# Patient Record
Sex: Male | Born: 1990
Health system: Southern US, Community
[De-identification: ages and names within clinical notes are randomized; demographics above are authoritative.]

## PROBLEM LIST (undated history)

## (undated) DIAGNOSIS — R569 Unspecified convulsions: Secondary | ICD-10-CM

## (undated) DIAGNOSIS — G35D Multiple sclerosis, unspecified: Secondary | ICD-10-CM

## (undated) DIAGNOSIS — G35 Multiple sclerosis: Secondary | ICD-10-CM

## (undated) HISTORY — PX: NO PAST SURGERIES: SHX2092

---
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
## 2013-05-01 DIAGNOSIS — F1210 Cannabis abuse, uncomplicated: Secondary | ICD-10-CM | POA: Insufficient documentation

## 2013-05-01 HISTORY — DX: Cannabis abuse, uncomplicated: F12.10

## 2016-05-30 DIAGNOSIS — H5202 Hypermetropia, left eye: Secondary | ICD-10-CM | POA: Insufficient documentation

## 2018-07-12 ENCOUNTER — Emergency Department (HOSPITAL_COMMUNITY): Payer: Medicare HMO

## 2018-07-12 ENCOUNTER — Observation Stay (HOSPITAL_COMMUNITY)
Admission: EM | Admit: 2018-07-12 | Discharge: 2018-07-13 | Disposition: A | Discharge status: Home / Self Care | Payer: Medicare HMO | Attending: Internal Medicine | Admitting: Internal Medicine

## 2018-07-12 ENCOUNTER — Encounter (HOSPITAL_COMMUNITY): Payer: Self-pay | Admitting: Internal Medicine

## 2018-07-12 DIAGNOSIS — G35A Relapsing-remitting multiple sclerosis: Secondary | ICD-10-CM

## 2018-07-12 DIAGNOSIS — J690 Pneumonitis due to inhalation of food and vomit: Secondary | ICD-10-CM

## 2018-07-12 DIAGNOSIS — Z20828 Contact with and (suspected) exposure to other viral communicable diseases: Secondary | ICD-10-CM | POA: Diagnosis not present

## 2018-07-12 DIAGNOSIS — G40901 Epilepsy, unspecified, not intractable, with status epilepticus: Secondary | ICD-10-CM

## 2018-07-12 DIAGNOSIS — Z114 Encounter for screening for human immunodeficiency virus [HIV]: Secondary | ICD-10-CM

## 2018-07-12 DIAGNOSIS — R000 Tachycardia, unspecified: Secondary | ICD-10-CM | POA: Diagnosis not present

## 2018-07-12 DIAGNOSIS — R0689 Other abnormalities of breathing: Secondary | ICD-10-CM | POA: Diagnosis not present

## 2018-07-12 DIAGNOSIS — G35 Multiple sclerosis: Secondary | ICD-10-CM | POA: Insufficient documentation

## 2018-07-12 DIAGNOSIS — R404 Transient alteration of awareness: Secondary | ICD-10-CM | POA: Diagnosis not present

## 2018-07-12 DIAGNOSIS — R569 Unspecified convulsions: Secondary | ICD-10-CM | POA: Diagnosis not present

## 2018-07-12 DIAGNOSIS — R0902 Hypoxemia: Secondary | ICD-10-CM | POA: Diagnosis not present

## 2018-07-12 DIAGNOSIS — R918 Other nonspecific abnormal finding of lung field: Secondary | ICD-10-CM | POA: Diagnosis not present

## 2018-07-12 HISTORY — DX: Unspecified convulsions: R56.9

## 2018-07-12 HISTORY — DX: Pneumonitis due to inhalation of food and vomit: J69.0

## 2018-07-12 HISTORY — DX: Multiple sclerosis: G35

## 2018-07-12 HISTORY — DX: Epilepsy, unspecified, not intractable, with status epilepticus: G40.901

## 2018-07-12 HISTORY — DX: Multiple sclerosis, unspecified: G35.D

## 2018-07-12 HISTORY — DX: Encounter for screening for human immunodeficiency virus (HIV): Z11.4

## 2018-07-12 LAB — BASIC METABOLIC PANEL
Anion gap: 14 (ref 5–15)
BUN: 9 mg/dL (ref 6–20)
CO2: 19 mmol/L — ABNORMAL LOW (ref 22–32)
Calcium: 9 mg/dL (ref 8.9–10.3)
Chloride: 105 mmol/L (ref 98–111)
Creatinine, Ser: 1.1 mg/dL (ref 0.61–1.24)
GFR calc Af Amer: >60 mL/min (ref >60)
GFR calc non Af Amer: >60 mL/min (ref >60)
Glucose, Bld: 114 mg/dL — ABNORMAL HIGH (ref 70–99)
Potassium: 4.3 mmol/L (ref 3.5–5.1)
Sodium: 138 mmol/L (ref 135–145)

## 2018-07-12 LAB — CBC WITH DIFFERENTIAL/PLATELET
Abs Immature Granulocytes: 0.06 10*3/uL (ref 0.00–0.07)
Basophils Absolute: 0 10*3/uL (ref 0.0–0.1)
Basophils Relative: 0 %
Eosinophils Absolute: 0 10*3/uL (ref 0.0–0.5)
Eosinophils Relative: 0 %
HCT: 45.5 % (ref 39.0–52.0)
Hemoglobin: 15.3 g/dL (ref 13.0–17.0)
Immature Granulocytes: 0 %
Lymphocytes Relative: 7 %
Lymphs Abs: 1.1 10*3/uL (ref 0.7–4.0)
MCH: 32.3 pg (ref 26.0–34.0)
MCHC: 33.6 g/dL (ref 30.0–36.0)
MCV: 96 fL (ref 80.0–100.0)
Monocytes Absolute: 1 10*3/uL (ref 0.1–1.0)
Monocytes Relative: 6 %
Neutro Abs: 13.2 10*3/uL — ABNORMAL HIGH (ref 1.7–7.7)
Neutrophils Relative %: 87 %
Platelets: 271 10*3/uL (ref 150–400)
RBC: 4.74 MIL/uL (ref 4.22–5.81)
RDW: 13.6 % (ref 11.5–15.5)
WBC: 15.4 10*3/uL — ABNORMAL HIGH (ref 4.0–10.5)
nRBC: 0 % (ref 0.0–0.2)

## 2018-07-12 LAB — ACETAMINOPHEN LEVEL: Acetaminophen (Tylenol), Serum: <10 ug/mL — ABNORMAL LOW (ref 10–30)

## 2018-07-12 LAB — CBG MONITORING, ED: Glucose-Capillary: 114 mg/dL — ABNORMAL HIGH (ref 70–99)

## 2018-07-12 LAB — RAPID URINE DRUG SCREEN, HOSP PERFORMED
Amphetamines: NOT DETECTED
Barbiturates: NOT DETECTED
Benzodiazepines: POSITIVE — AB
Cocaine: NOT DETECTED
Opiates: NOT DETECTED
Tetrahydrocannabinol: POSITIVE — AB

## 2018-07-12 LAB — ETHANOL: Alcohol, Ethyl (B): <10 mg/dL (ref <10)

## 2018-07-12 LAB — SALICYLATE LEVEL: Salicylate Lvl: <7 mg/dL (ref 2.8–30.0)

## 2018-07-12 MED ORDER — SODIUM CHLORIDE 0.9 % IV SOLN
INTRAVENOUS | Status: DC
  Administered 2018-07-12 – 2018-07-13 (×2): via INTRAVENOUS

## 2018-07-12 MED ORDER — LORAZEPAM 2 MG/ML IJ SOLN
1.0000 mg | Freq: Once | INTRAMUSCULAR | Status: AC
  Administered 2018-07-12: 1 mg via INTRAVENOUS
  Filled 2018-07-12: qty 1

## 2018-07-12 MED ORDER — SODIUM CHLORIDE 0.9 % IV SOLN
3.0000 g | Freq: Four times a day (QID) | INTRAVENOUS | Status: DC
  Administered 2018-07-13 (×3): 3 g via INTRAVENOUS
  Filled 2018-07-12 (×6): qty 3

## 2018-07-12 MED ORDER — LEVETIRACETAM IN NACL 500 MG/100ML IV SOLN
500.0000 mg | Freq: Two times a day (BID) | INTRAVENOUS | Status: DC
  Administered 2018-07-13: 500 mg via INTRAVENOUS
  Filled 2018-07-12: qty 100

## 2018-07-12 MED ORDER — ACETAMINOPHEN 650 MG RE SUPP: 650.0000 mg | RECTAL | Status: DC | PRN

## 2018-07-12 MED ORDER — SODIUM CHLORIDE 0.9 % IV BOLUS
1000.0000 mL | Freq: Once | INTRAVENOUS | Status: AC
  Administered 2018-07-12: 1000 mL via INTRAVENOUS

## 2018-07-12 MED ORDER — PIPERACILLIN-TAZOBACTAM 3.375 G IVPB 30 MIN
3.3750 g | Freq: Once | INTRAVENOUS | Status: AC
  Administered 2018-07-12: 3.375 g via INTRAVENOUS
  Filled 2018-07-12: qty 50

## 2018-07-12 MED ORDER — SODIUM CHLORIDE 0.9 % IV SOLN: 75.0000 mL/h | INTRAVENOUS | Status: DC

## 2018-07-12 MED ORDER — LEVETIRACETAM IN NACL 1000 MG/100ML IV SOLN
1000.0000 mg | Freq: Once | INTRAVENOUS | Status: AC
  Administered 2018-07-12: 1000 mg via INTRAVENOUS
  Filled 2018-07-12: qty 100

## 2018-07-12 MED ORDER — ENOXAPARIN SODIUM 40 MG/0.4ML ~~LOC~~ SOLN
40.0000 mg | Freq: Every day | SUBCUTANEOUS | Status: DC
  Administered 2018-07-13: 40 mg via SUBCUTANEOUS
  Filled 2018-07-12: qty 0.4

## 2018-07-12 MED ORDER — ACETAMINOPHEN 325 MG PO TABS: 650.0000 mg | ORAL_TABLET | ORAL | Status: DC | PRN

## 2018-07-12 MED ORDER — LORAZEPAM 2 MG/ML IJ SOLN: 1.0000 mg | INTRAMUSCULAR | Status: DC | PRN

## 2018-07-12 NOTE — Progress Notes (Signed)
Pharmacy Antibiotic Note  Miguel Black is a 28 y.o. male admitted on 07/12/2018 with pneumonia.  Pharmacy has been consulted for unasyn dosing. Pt is afebrile but WBC Is elevated at 15.4. SCr is WNL at 1.1  Plan: Unasyn 3gm IV Q6H F/u renal fxn, C&S, clinical status *Pharmacy will sign off as no further dose adjustments are anticipated.   Height: 5\' 8"  (172.7 cm) Weight: 144 lb 10 oz (65.6 kg) IBW/kg (Calculated) : 68.4  Temp (24hrs), Avg:98.1 F (36.7 C), Min:98.1 F (36.7 C), Max:98.1 F (36.7 C)  Recent Labs  Lab 07/12/18 2024  WBC 15.4*  CREATININE 1.10    Estimated Creatinine Clearance: 93.6 mL/min (by C-G formula based on SCr of 1.1 mg/dL).    Not on File  Antimicrobials this admission: Unasyn 6/19>> Zosyn x 1 6/18  Dose adjustments this admission: N/A  Microbiology results: Pending  Thank you for allowing pharmacy to be a part of this patient's care.  Loretta Doutt, Rande Lawman 07/12/2018 10:38 PM

## 2018-07-12 NOTE — H&P (Signed)
History and Physical    Miguel Black ZOX:[id number] DOB: 07/22/1990 DOA: 07/12/2018  PCP: No primary care provider on file. Patient coming from: Home  Chief Complaint: Seizures  HPI: Miguel Black is a 28 y.o. male with a past medical history of seizure, MS presenting to the hospital for evaluation of seizures. Per EMS, patient had 2-3 seizures at home and additional 2 seizures in route and was given 10 mg Versed.  Family reported history of a seizure 2 years ago in [place].  He was evaluated in the hospital at that time and discharged home on Keppra.  He was on Keppra briefly.  He has MS and is followed at [facility] by Dr. Macarthur CritchleyZeid.  He was switched to Ocrevus IV a few months ago.  Patient is currently very somnolent and postictal.  No history could be obtained from him.  ED Course: Afebrile.  Tachycardic and tachypneic on arrival, now resolved.  White count 15.4.  Blood glucose 114.  Salicylate and acetaminophen levels negative.  Blood ethanol level less than 10.  UDS positive for benzodiazepines and THC.  Lactic acid pending.  COVID-19 rapid test pending.  Blood culture x2 pending.  Chest x-ray showing patchy opacities in the right greater than left mid lower lung zones suspicious for aspiration in the setting of seizure, pneumonia also considered.  Head CT negative for acute finding.  Patient received Keppra 1000 mg loading dose and an additional 1000 mg ordered by neurology.  Also received Zosyn.  Review of Systems:  All systems reviewed and apart from history of presenting illness, are negative.  Past Medical History:  Diagnosis Date  . MS (multiple sclerosis) (HCC)   . Seizure [facility](HCC)    Surgical history: Patient is postictal. Not able to obtain at this time.  Allergies: Patient is postictal. Not able to obtain at this time.  Social history: Patient is postictal. Not able to obtain at this time.  Family history: Patient is postictal. Not able to obtain at this time.  Prior to  Admission medications   Not on File    Physical Exam: Vitals:   07/12/18 2030 07/12/18 2034 07/12/18 2115 07/12/18 2205  BP:  (!) 137/95 (!) 161/92 106/78  Pulse: 93  100 83  Resp: (!) 21 (!) 30 (!) 25 18  Temp:    98.1 F (36.7 C)  TempSrc:    Oral  SpO2: 98% 100% 99% 96%  Weight:    65.6 kg  Height:    5\' 8"  (1.727 m)    Physical Exam  Constitutional: He appears well-developed and well-nourished. No distress.  HENT:  Head: Normocephalic.  Eyes: Pupils are equal, round, and reactive to light. Right eye exhibits no discharge. Left eye exhibits no discharge.  Neck: Neck supple.  Cardiovascular: Regular rhythm and intact distal pulses.  Slightly tachycardic  Pulmonary/Chest: Effort normal. No respiratory distress. He has no wheezes. He has no rales.  Anterior lung fields clear to auscultation  Abdominal: Soft. Bowel sounds are normal. He exhibits no distension. There is no abdominal tenderness. There is no guarding.  Musculoskeletal:        General: No edema.  Neurological:  Postictal, very somnolent Not opening eyes or following commands Grimaces to painful stimuli  Skin: Skin is warm and dry. He is not diaphoretic.     Labs on Admission: I have personally reviewed following labs and imaging studies  CBC: Recent Labs  Lab 07/12/18 2024  WBC 15.4*  NEUTROABS 13.2*  HGB 15.3  HCT 45.5  MCV 96.0  PLT 271   Basic Metabolic Panel: Recent Labs  Lab 07/12/18 2024  NA 138  K 4.3  CL 105  CO2 19*  GLUCOSE 114*  BUN 9  CREATININE 1.10  CALCIUM 9.0   GFR: Estimated Creatinine Clearance: 93.6 mL/min (by C-G formula based on SCr of 1.1 mg/dL). Liver Function Tests: No results for input(s): AST, ALT, ALKPHOS, BILITOT, PROT, ALBUMIN in the last 168 hours. No results for input(s): LIPASE, AMYLASE in the last 168 hours. No results for input(s): AMMONIA in the last 168 hours. Coagulation Profile: No results for input(s): INR, PROTIME in the last 168 hours.  Cardiac Enzymes: No results for input(s): CKTOTAL, CKMB, CKMBINDEX, TROPONINI in the last 168 hours. BNP (last 3 results) No results for input(s): PROBNP in the last 8760 hours. HbA1C: No results for input(s): HGBA1C in the last 72 hours. CBG: Recent Labs  Lab 07/12/18 2013  GLUCAP 114*   Lipid Profile: No results for input(s): CHOL, HDL, LDLCALC, TRIG, CHOLHDL, LDLDIRECT in the last 72 hours. Thyroid Function Tests: No results for input(s): TSH, T4TOTAL, FREET4, T3FREE, THYROIDAB in the last 72 hours. Anemia Panel: No results for input(s): VITAMINB12, FOLATE, FERRITIN, TIBC, IRON, RETICCTPCT in the last 72 hours. Urine analysis: No results found for: COLORURINE, APPEARANCEUR, LABSPEC, PHURINE, GLUCOSEU, HGBUR, BILIRUBINUR, KETONESUR, PROTEINUR, UROBILINOGEN, NITRITE, LEUKOCYTESUR  Radiological Exams on Admission: Ct Head Wo Contrast  Result Date: 07/12/2018 CLINICAL DATA:  Seizure EXAM: CT HEAD WITHOUT CONTRAST TECHNIQUE: Contiguous axial images were obtained from the base of the skull through the vertex without intravenous contrast. COMPARISON:  None. FINDINGS: Brain: No evidence of acute infarction, hemorrhage, hydrocephalus, extra-axial collection or mass lesion/mass effect. Vascular: No hyperdense vessel or unexpected calcification. Skull: Normal. Negative for fracture or focal lesion. Sinuses/Orbits: Mucous retention cyst in the right maxillary sinus. Minimal mucosal thickening in the sphenoid and ethmoid sinus Other: None IMPRESSION: Negative non contrasted CT appearance of the brain. Electronically Signed   By: Jasmine PangKim  Fujinaga M.D.   On: 07/12/2018 21:19   Dg Chest Port 1 View  Result Date: 07/12/2018 CLINICAL DATA:  Seizure. EXAM: PORTABLE CHEST 1 VIEW COMPARISON:  None. FINDINGS: Patchy opacities in the right greater than left mid lower lung zones. Lung volumes are low. Heart is normal in size. Normal mediastinal contours for rotation. No pulmonary edema, pleural effusion or  pneumothorax. No acute osseous abnormalities. IMPRESSION: Patchy opacities in the right greater than left mid lower lung zones, may be suspicious for aspiration in the setting of seizure, pneumonia also considered. Electronically Signed   By: Narda RutherfordMelanie  Sanford M.D.   On: 07/12/2018 20:43    EKG: Independently reviewed.  Sinus rhythm, biatrial enlargement, no prior tracing for comparison.  Assessment/Plan Principal Problem:   Status epilepticus (HCC) Active Problems:   Aspiration pneumonia (HCC)   MS (multiple sclerosis) (HCC)   Encounter for screening for HIV   Status epilepticus Head CT negative for acute finding.  UDS was positive for benzodiazepines and THC.  Patient was seen by neurology and it is thought that seizure could possibly be related to proconvulsant activity of possible additive to cannabis.  May also be lesional or cryptogenic epilepsy.  Blood ethanol level less than 10, question whether seizure activity is related to alcohol withdrawal. -Monitor in the progressive care unit -Received loading dose Keppra in the ED.  Continue IV Keppra 500 mg twice daily. -Ativan PRN -Brain MRI with and without contrast -EEG -Check magnesium level -Lactic acid level pending -  Seizure precautions -Frequent neurochecks -Continuous pulse ox  Aspiration pneumonia Afebrile.  White count 15.4.  Currently not hypoxic or tachypneic.  Chest x-ray showing patchy opacities in the right greater than left mid lower lung zones suspicious for aspiration pneumonia in the setting of seizures. -Received a dose of Zosyn in the ED.  Continue antibiotic coverage with Unasyn. -Continuous pulse ox -Supplemental oxygen if needed -COVID-19 rapid test pending -Blood culture x2 pending -Continue to monitor CBC  History of MS Followed at [facility] by Dr. Arvella Nigh.  He was switched to Elaine IV a few months ago.   -Please ensure outpatient follow-up  HIV screening The patient falls between the ages of 13-64 and  should be screened for HIV, therefore HIV testing ordered.  DVT prophylaxis: Lovenox Code Status: Full code Family Communication: No family available at this time. Disposition Plan: Anticipate discharge after clinical improvement.   Consults called: Neurology (Dr. Cheral Marker) Admission status: It is my clinical opinion that referral for OBSERVATION is reasonable and necessary in this patient based on the above information provided. The aforementioned taken together are felt to place the patient at high risk for further clinical deterioration. However it is anticipated that the patient may be medically stable for discharge from the hospital within 24 to 48 hours.  The medical decision making on this patient was of high complexity and the patient is at high risk for clinical deterioration, therefore this is a level 3 visit.  Shela Leff MD [facility] Pager [phone]  If 7PM-7AM, please contact night-coverage [email or web address] Password [facility]  07/12/2018, 10:51 PM

## 2018-07-12 NOTE — Consult Note (Signed)
NEURO HOSPITALIST CONSULT NOTE   Requestig physician: Dr. Haviland  Reason for Consult: Breakthrough seizures  History obParticia Nearingtained from:   Chart    HPI:                                                                                                                                          Miguel Black is an 28 y.o. male with a PMHx of multiple sclerosis and seizures, who presents to the [facility] ED with multiple seizures. Per EMS, he had 2-3 seizures at home and 2 additional seizures en route to the hospital with EMS. He was administered 10 mg Versed by EMS.   Per chart, family reported that the patient had a seizure 2 years ago in [place], for which he was evaluated in the hospital - he had been discharged home on Keppra at that time and continued to take Keppra for a brief period. He follows Dr. Macarthur CritchleyZeid for his multiple sclerosis. He was switched to Ocrevus infusions (ocrelizumab) for his MS a few months ago.   WBC count in the ED was 15.4. UDS was positive for THC. CXR was suggestive of aspiration. He was administered Zosyn.   PMHx: MS Seizure disorder   No family history on file.            Social History:  has no history on file for tobacco, alcohol, and drug.  Not on File  HOME MEDICATIONS:                                                                                                                     None.    ROS:  Unable to obtain due to postictal state.    Blood pressure (!) 161/92, pulse 100, resp. rate (!) 25, SpO2 99 %.   General Examination:                                                                                                       Physical Exam  HEENT-  [place]/AT. In C-collar.   Lungs- Respirations unlabored Extremities- No edema.   Neurological Examination Mental Status:  Obtunded/somnolent/postictal. Will partially open eyes transiently to deep sternal rub. Sonorous respirations. Does not follow commands. No attempts to communicate. Swatted once semi-purposefully at examiner with RUE during sternal rub.  Cranial Nerves:  II: With eyelids held open by examiner, the patient does not move eyes towards or away from light stimulation. No blink to threat.   III,IV, VI: Exotropia noted during the sleep state, with right eye at midline and left eye externally deviated. No nystagmus or twitching noted. Unable to assess for oculocephalic reflex due to C-collar.  V,VII: Mouth open during sleep with no gross asymmetry noted. Does not respond to brow ridge pressure.  VIII: Does not respond to voice IX,X: Unable to assess.  XI: Unable to assess XII: midline tongue noted with mouth open Motor/Sensory: RUE: Swats semipurposefully at examiner with sternal rub. Moves to pinch. Tone mildly increased.  LUE: Does not move purposefully, but will move slightly to sternal rub. Does not move to pinch. Tone mildly increased.  RLE: Withdraws briskly to noxious stimulus, which induces brief ankle clonus.  LLE: Withdraws less briskly than the RLE to noxious stimulus. This also induces brief ankle clonus.  On passive dorsiflexion of feet, 4-5 beats of clonus are present bilaterally  Deep Tendon Reflexes: 3+ bilateral biceps and brachioradialis. 4+ patellae bilaterally with 2 beats clonus. 3+ achilles bilaterally.  Plantars: Right: downgoing   Left: downgoing Cerebellar/Gait: Unable to assess    Lab Results: Basic Metabolic Panel: Recent Labs  Lab 07/12/18 2024  NA 138  K 4.3  CL 105  CO2 19*  GLUCOSE 114*  BUN 9  CREATININE 1.10  CALCIUM 9.0    CBC: Recent Labs  Lab 07/12/18 2024  WBC 15.4*  NEUTROABS 13.2*  HGB 15.3  HCT 45.5  MCV 96.0  PLT 271    Cardiac Enzymes: No results for input(s): CKTOTAL, CKMB, CKMBINDEX, TROPONINI in the last 168 hours.  Lipid  Panel: No results for input(s): CHOL, TRIG, HDL, CHOLHDL, VLDL, LDLCALC in the last 168 hours.  Imaging: Ct Head Wo Contrast  Result Date: 07/12/2018 CLINICAL DATA:  Seizure EXAM: CT HEAD WITHOUT CONTRAST TECHNIQUE: Contiguous axial images were obtained from the base of the skull through the vertex without intravenous contrast. COMPARISON:  None. FINDINGS: Brain: No evidence of acute infarction, hemorrhage, hydrocephalus, extra-axial collection or mass lesion/mass effect. Vascular: No hyperdense vessel or unexpected calcification. Skull: Normal. Negative for fracture or focal lesion. Sinuses/Orbits: Mucous retention cyst in the right maxillary sinus. Minimal mucosal thickening in the sphenoid and ethmoid sinus Other: None IMPRESSION: Negative non contrasted CT appearance of the brain. Electronically Signed   By:  Donavan Foil M.D.   On: 07/12/2018 21:19   Dg Chest Port 1 View  Result Date: 07/12/2018 CLINICAL DATA:  Seizure. EXAM: PORTABLE CHEST 1 VIEW COMPARISON:  None. FINDINGS: Patchy opacities in the right greater than left mid lower lung zones. Lung volumes are low. Heart is normal in size. Normal mediastinal contours for rotation. No pulmonary edema, pleural effusion or pneumothorax. No acute osseous abnormalities. IMPRESSION: Patchy opacities in the right greater than left mid lower lung zones, may be suspicious for aspiration in the setting of seizure, pneumonia also considered. Electronically Signed   By: Keith Rake M.D.   On: 07/12/2018 20:43    Assessment: 28 year old male with new onset seizures x 2 1. Urine toxicology negative for sympathomimetics but positive for THC. Although cannabis traditionally has been used as an anticonvulsant by some cultures for millenia, the proconvulsant versus anticonvulsant acitivity of newer strains is not definitvely known. Use of cannabis also may be associated with additives, some of which may have a proconvulsant effect. Two recent studies (from  2017 and 2020) in mice suggests that high doses of THC may have a proconvulsant rather than an anticonvulsant effect. One of the above studies showed a strong relationship between seizure induction and the synthetic cannabinoids (in slang parlance "spice") JWH-073 and AM-2201. 2. The patient may also have a lesional or cryptogenic epilepsy. Asymmetry seen on his neurological exam, performed while in a postictal state, suggests a focal onset seizure, which would increase the likelihood of an underlying lesion. MRI is indicated to rule out an underlying lesion. CT head was negative, but this imaging modality is of lower sensitivity than MRI.   3. EtOH level was < 10. Consider possible EtOH withdrawal seizures.  4. Of note, side effects of ocrelizumab (depletes CD20 B cells) include possible infections including HSV and PML.   Recommendations: 1. Has been loaded with Keppra 1000 mg. Was still with some twitching, unclear if due to seizure, but additional Keppra 1000 mg IV is being loaded.  2. Will continue Keppra at 500 mg BID (ordered) 3. EEG in AM 4. Magnesium level 5. MRI brain with and without contrast.  6. Inpatient seizure precautions 7. Frequent neuro checks 8. Per [facility] statutes, patients with seizures are not allowed to drive until  they have been seizure-free for six months. Use caution when using heavy equipment or power tools. Avoid working on ladders or at heights. Take showers instead of baths. Ensure the water temperature is not too high on the home water heater. Do not go swimming alone. When caring for infants or small children, sit down when holding, feeding, or changing them to minimize risk of injury to the child in the event you have a seizure. Also, Maintain good sleep hygiene. Avoid alcohol.   Electronically signed: Dr. Kerney Elbe 07/12/2018, 9:48 PM

## 2018-07-12 NOTE — ED Notes (Signed)
Please call mom Demetries Coia @ [phone] for status update--Lige Lakeman

## 2018-07-12 NOTE — ED Provider Notes (Signed)
[facility] EMERGENCY DEPARTMENT Provider Note   CSN: [id number] Arrival date & time: 07/12/18  1947    History   Chief Complaint Chief Complaint  Patient presents with  . Seizures    HPI Miguel Black is a 28 y.o. male.     Pt presents to the ED today with seizures.  Information is obtained via EMS as pt is unable to give any hx.  Per EMS, pt has had several seizures today.  1 was at home witnessed by his father.  Father told EMS it was all over his body.  EMS witnessed 2 grand mal seizures en route.  He was given a total of 10 mg versed IM.  The father told EMS that he's had 1 seizure in his life, but is not on seizure meds.  After getting additional history from his mom, pt had a seizure 2 years ago in [place].  He was evaluated in a hospital and was d/c home with keppra.  He was only on that briefly.  He has MS and is followed at [facility] by Dr. Macarthur Critchley.  He was switched to Ocrevus IV a few months ago.             Past Medical History:  Diagnosis Date  . Multiple sclerosis (HCC)   Past Surgical History:  Procedure Laterality Date  . NO PAST SURGERIES   No Known Allergies Home Medications  Medication Sig Dispense Refill  . cholecalciferol (VITAMIN D3) 1000 UNIT Tab Take 1,000 Units by mouth daily.  Emogene Morgan IV Last reviewed on 12/05/2017 3:21 PM by Neita Garnet, CMAFamily History  Problem Relation Age of Onset  . Hypertension Mother   Soc hx:  + tob, occ mj, occ etoh   Home Medications    Prior to Admission medications   Not on File    Family History No family history on file.  Social History Social History   Tobacco Use  . Smoking status: Not on file  Substance Use Topics  . Alcohol use: Not on file  . Drug use: Not on file     Allergies   Patient has no allergy information on record.  NKDA per mom  Review of Systems Review of Systems  Neurological: Positive for seizures.  All other systems reviewed  and are negative.    Physical Exam Updated Vital Signs BP 106/78 (BP Location: Right Arm)   Pulse 83   Temp 98.1 F (36.7 C) (Oral)   Resp 18   SpO2 96%   Physical Exam Vitals signs and nursing note reviewed.  Constitutional:      General: He is in acute distress.  HENT:     Head: Normocephalic and atraumatic.     Right Ear: External ear normal.     Left Ear: External ear normal.     Nose: Nose normal.     Mouth/Throat:     Mouth: Mucous membranes are dry.  Eyes:     Extraocular Movements: Extraocular movements intact.     Conjunctiva/sclera: Conjunctivae normal.     Pupils: Pupils are equal, round, and reactive to light.  Neck:     Musculoskeletal: Normal range of motion and neck supple.  Cardiovascular:     Rate and Rhythm: Regular rhythm. Tachycardia present.     Pulses: Normal pulses.     Heart sounds: Normal heart sounds.  Pulmonary:     Effort: Pulmonary effort is normal.     Breath sounds: Normal breath sounds.  Abdominal:     General: Abdomen is flat. Bowel sounds are normal.     Palpations: Abdomen is soft.  Skin:    General: Skin is warm.     Capillary Refill: Capillary refill takes less than 2 seconds.  Neurological:     Comments: Responsive to painful stimuli  Tremors      ED Treatments / Results  Labs (all labs ordered are listed, but only abnormal results are displayed) Labs Reviewed  BASIC METABOLIC PANEL - Abnormal; Notable for the following components:      Result Value   CO2 19 (*)    Glucose, Bld 114 (*)    All other components within normal limits  CBC WITH DIFFERENTIAL/PLATELET - Abnormal; Notable for the following components:   WBC 15.4 (*)    Neutro Abs 13.2 (*)    All other components within normal limits  ACETAMINOPHEN LEVEL - Abnormal; Notable for the following components:   Acetaminophen (Tylenol), Serum <10 (*)    All other components within normal limits  RAPID URINE DRUG SCREEN, HOSP PERFORMED - Abnormal; Notable for the  following components:   Benzodiazepines POSITIVE (*)    Tetrahydrocannabinol POSITIVE (*)    All other components within normal limits  CBG MONITORING, ED - Abnormal; Notable for the following components:   Glucose-Capillary 114 (*)    All other components within normal limits  NOVEL CORONAVIRUS, NAA (HOSPITAL ORDER, SEND-OUT TO REF LAB)  CULTURE, BLOOD (ROUTINE X 2)  CULTURE, BLOOD (ROUTINE X 2)  SALICYLATE LEVEL  ETHANOL  LACTIC ACID, PLASMA  LACTIC ACID, PLASMA    EKG EKG Interpretation  Date/Time:  Thursday July 12 2018 19:59:49 EDT Ventricular Rate:  96 PR Interval:    QRS Duration: 77 QT Interval:  346 QTC Calculation: 438 R Axis:   79 Text Interpretation:  Sinus rhythm Biatrial enlargement No old tracing to compare Confirmed by Isla Pence [phone]) on 07/12/2018 8:13:08 PM   Radiology Ct Head Wo Contrast  Result Date: 07/12/2018 CLINICAL DATA:  Seizure EXAM: CT HEAD WITHOUT CONTRAST TECHNIQUE: Contiguous axial images were obtained from the base of the skull through the vertex without intravenous contrast. COMPARISON:  None. FINDINGS: Brain: No evidence of acute infarction, hemorrhage, hydrocephalus, extra-axial collection or mass lesion/mass effect. Vascular: No hyperdense vessel or unexpected calcification. Skull: Normal. Negative for fracture or focal lesion. Sinuses/Orbits: Mucous retention cyst in the right maxillary sinus. Minimal mucosal thickening in the sphenoid and ethmoid sinus Other: None IMPRESSION: Negative non contrasted CT appearance of the brain. Electronically Signed   By: Donavan Foil M.D.   On: 07/12/2018 21:19   Dg Chest Port 1 View  Result Date: 07/12/2018 CLINICAL DATA:  Seizure. EXAM: PORTABLE CHEST 1 VIEW COMPARISON:  None. FINDINGS: Patchy opacities in the right greater than left mid lower lung zones. Lung volumes are low. Heart is normal in size. Normal mediastinal contours for rotation. No pulmonary edema, pleural effusion or pneumothorax. No  acute osseous abnormalities. IMPRESSION: Patchy opacities in the right greater than left mid lower lung zones, may be suspicious for aspiration in the setting of seizure, pneumonia also considered. Electronically Signed   By: Keith Rake M.D.   On: 07/12/2018 20:43    Procedures Procedures (including critical care time)  Medications Ordered in ED Medications  sodium chloride 0.9 % bolus 1,000 mL (1,000 mLs Intravenous New Bag/Given 07/12/18 2000)    And  0.9 %  sodium chloride infusion (has no administration in time range)  piperacillin-tazobactam (ZOSYN) IVPB 3.375  g (3.375 g Intravenous New Bag/Given 07/12/18 2151)  levETIRAcetam (KEPPRA) IVPB 1000 mg/100 mL premix (has no administration in time range)  levETIRAcetam (KEPPRA) IVPB 500 mg/100 mL premix (has no administration in time range)  levETIRAcetam (KEPPRA) IVPB 1000 mg/100 mL premix (0 mg Intravenous Stopped 07/12/18 2030)  LORazepam (ATIVAN) injection 1 mg (1 mg Intravenous Given 07/12/18 2029)     Initial Impression / Assessment and Plan / ED Course  I have reviewed the triage vital signs and the nursing notes.  Pertinent labs & imaging results that were available during my care of the patient were reviewed by me and considered in my medical decision making (see chart for details).  Pt was given an additional 1 g ativan IV and was given 2g keppra IV per Dr. Shelbie HutchingLindzen's recommendation.   The pt was put on 2l oxygen via [place].    The pt was given IV abx for his aspiration pneumonia.  Pt's mother updated on plan.  Pt d/w Dr. Loney Lohathore (triad) for admission.  CRITICAL CARE Performed by: Jacalyn LefevreJulie Thanos Cousineau   Total critical care time: 30 minutes  Critical care time was exclusive of separately billable procedures and treating other patients.  Critical care was necessary to treat or prevent imminent or life-threatening deterioration.  Critical care was time spent personally by me on the following activities: development of  treatment plan with patient and/or surrogate as well as nursing, discussions with consultants, evaluation of patient's response to treatment, examination of patient, obtaining history from patient or surrogate, ordering and performing treatments and interventions, ordering and review of laboratory studies, ordering and review of radiographic studies, pulse oximetry and re-evaluation of patient's condition.  Miguel Black was evaluated in Emergency Department on 07/12/2018 for the symptoms described in the history of present illness. He was evaluated in the context of the global COVID-19 pandemic, which necessitated consideration that the patient might be at risk for infection with the SARS-CoV-2 virus that causes COVID-19. Institutional protocols and algorithms that pertain to the evaluation of patients at risk for COVID-19 are in a state of rapid change based on information released by regulatory bodies including the CDC and federal and state organizations. These policies and algorithms were followed during the patient's care in the ED. Final Clinical Impressions(s) / ED Diagnoses   Final diagnoses:  Status epilepticus (HCC)  Aspiration pneumonia of both lungs, unspecified aspiration pneumonia type, unspecified part of lung [facility](HCC)    ED Discharge Orders    None       Jacalyn LefevreHaviland, Carisa Backhaus, MD 07/12/18 2244

## 2018-07-12 NOTE — ED Notes (Signed)
Patient transported to CT. Nurse with pt.

## 2018-07-12 NOTE — ED Notes (Signed)
ED TO INPATIENT HANDOFF REPORT  ED Nurse Name and Phone #:  Heyward Douthit  S Name/Age/Gender Miguel Black 28 y.o. male Room/Bed: 029C/029C  Code Status   Code Status: Full Code  Home/SNF/Other Home Patient oriented to: self Is this baseline? -  Triage Complete: Triage complete  Chief Complaint seizures  Triage Note Pt. BIB EMS from home due to seizure activity. Family reports 2-3 seizures. Per EMS 2 episodes of seizures with 10 mg versed given.   EMS VS CBG 116 BP 130/70 RR 30 C Collar present to maintain airway  15L     Allergies Not on File  Level of Care/Admitting Diagnosis ED Disposition    ED Disposition Condition Comment   Admit  Hospital Area: [facility] [[id number]]  Level of Care: Progressive [102]  I expect the patient will be discharged within 24 hours: No (not a candidate for 5C-Observation unit)  Covid Evaluation: Person Under Investigation (PUI)  Isolation Risk Level: Low Risk/Droplet (Less than 4L [place] supplementation)  Diagnosis: Status epilepticus [facility]) [211941]  Admitting Physician: John Giovanni [[id number]]  Attending Physician: John Giovanni [[id number]]  PT Class (Do Not Modify): Observation [104]  PT Acc Code (Do Not Modify): Observation [10022]       B Medical/Surgery History Past Medical History:  Diagnosis Date  . MS (multiple sclerosis) (HCC)   . Seizure (HCC)       A IV Location/Drains/Wounds Patient Lines/Drains/Airways Status   Active Line/Drains/Airways    Name:   Placement date:   Placement time:   Site:   Days:   Peripheral IV 07/12/18 Left Forearm   07/12/18    2022    Forearm   less than 1   External Urinary Catheter   07/12/18    2032    -   less than 1          Intake/Output Last 24 hours No intake or output data in the 24 hours ending 07/12/18 2242  Labs/Imaging Results for orders placed or performed during the hospital encounter of 07/12/18 (from the past 48 hour(s))  Salicylate  level     Status: None   Collection Time: 07/12/18  7:52 PM  Result Value Ref Range   Salicylate Lvl <7.0 2.8 - 30.0 mg/dL    Comment: Performed at [facility], [address] [place], [place] [address]  Acetaminophen level     Status: Abnormal   Collection Time: 07/12/18  7:52 PM  Result Value Ref Range   Acetaminophen (Tylenol), Serum <10 (L) 10 - 30 ug/mL    Comment: (NOTE) Therapeutic concentrations vary significantly. A range of 10-30 ug/mL  may be an effective concentration for many patients. However, some  are best treated at concentrations outside of this range. Acetaminophen concentrations >150 ug/mL at 4 hours after ingestion  and >50 ug/mL at 12 hours after ingestion are often associated with  toxic reactions. Performed at [facility], [address] [place], [place] [address]   Ethanol/ETOH     Status: None   Collection Time: 07/12/18  7:52 PM  Result Value Ref Range   Alcohol, Ethyl (B) <10 <10 mg/dL    Comment: (NOTE) Lowest detectable limit for serum alcohol is 10 mg/dL. For medical purposes only. Performed at [facility], [address], [place], [place] [address]   CBG monitoring, ED     Status: Abnormal   Collection Time: 07/12/18  8:13 PM  Result Value Ref Range   Glucose-Capillary  114 (H) 70 - 99 mg/dL  Basic metabolic panel     Status: Abnormal   Collection Time: 07/12/18  8:24 PM  Result Value Ref Range   Sodium 138 135 - 145 mmol/L   Potassium 4.3 3.5 - 5.1 mmol/L   Chloride 105 98 - 111 mmol/L   CO2 19 (L) 22 - 32 mmol/L   Glucose, Bld 114 (H) 70 - 99 mg/dL   BUN 9 6 - 20 mg/dL   Creatinine, Ser 5.621.10 0.61 - 1.24 mg/dL   Calcium 9.0 8.9 - 13.010.3 mg/dL   GFR calc non Af Amer >60 >60 mL/min   GFR calc Af Amer >60 >60 mL/min   Anion gap 14 5 - 15    Comment: Performed at [facility], [address] [place], [place] [address]  CBC WITH DIFFERENTIAL     Status: Abnormal   Collection Time: 07/12/18  8:24 PM  Result Value  Ref Range   WBC 15.4 (H) 4.0 - 10.5 K/uL   RBC 4.74 4.22 - 5.81 MIL/uL   Hemoglobin 15.3 13.0 - 17.0 g/dL   HCT 46.945.5 62.939.0 - 52.852.0 %   MCV 96.0 80.0 - 100.0 fL   MCH 32.3 26.0 - 34.0 pg   MCHC 33.6 30.0 - 36.0 g/dL   RDW 41.313.6 24.411.5 - 01.015.5 %   Platelets 271 150 - 400 K/uL   nRBC 0.0 0.0 - 0.2 %   Neutrophils Relative % 87 %   Neutro Abs 13.2 (H) 1.7 - 7.7 K/uL   Lymphocytes Relative 7 %   Lymphs Abs 1.1 0.7 - 4.0 K/uL   Monocytes Relative 6 %   Monocytes Absolute 1.0 0.1 - 1.0 K/uL   Eosinophils Relative 0 %   Eosinophils Absolute 0.0 0.0 - 0.5 K/uL   Basophils Relative 0 %   Basophils Absolute 0.0 0.0 - 0.1 K/uL   Immature Granulocytes 0 %   Abs Immature Granulocytes 0.06 0.00 - 0.07 K/uL    Comment: Performed at [facility], [address] [place], [place] [address]  Urine rapid drug screen (hosp performed)     Status: Abnormal   Collection Time: 07/12/18  8:30 PM  Result Value Ref Range   Opiates NONE DETECTED NONE DETECTED   Cocaine NONE DETECTED NONE DETECTED   Benzodiazepines POSITIVE (A) NONE DETECTED   Amphetamines NONE DETECTED NONE DETECTED   Tetrahydrocannabinol POSITIVE (A) NONE DETECTED   Barbiturates NONE DETECTED NONE DETECTED    Comment: (NOTE) DRUG SCREEN FOR MEDICAL PURPOSES ONLY.  IF CONFIRMATION IS NEEDED FOR ANY PURPOSE, NOTIFY LAB WITHIN 5 DAYS. LOWEST DETECTABLE LIMITS FOR URINE DRUG SCREEN Drug Class                     Cutoff (ng/mL) Amphetamine and metabolites    1000 Barbiturate and metabolites    200 Benzodiazepine                 200 Tricyclics and metabolites     300 Opiates and metabolites        300 Cocaine and metabolites        300 THC                            50 Performed at [facility] [facility], [address] [place], [place] [address]    Ct Head Wo Contrast  Result Date: 07/12/2018 CLINICAL DATA:  Seizure EXAM: CT  HEAD WITHOUT CONTRAST TECHNIQUE: Contiguous axial images were obtained from the base of the skull through  the vertex without intravenous contrast. COMPARISON:  None. FINDINGS: Brain: No evidence of acute infarction, hemorrhage, hydrocephalus, extra-axial collection or mass lesion/mass effect. Vascular: No hyperdense vessel or unexpected calcification. Skull: Normal. Negative for fracture or focal lesion. Sinuses/Orbits: Mucous retention cyst in the right maxillary sinus. Minimal mucosal thickening in the sphenoid and ethmoid sinus Other: None IMPRESSION: Negative non contrasted CT appearance of the brain. Electronically Signed   By: Donavan Foil M.D.   On: 07/12/2018 21:19   Dg Chest Port 1 View  Result Date: 07/12/2018 CLINICAL DATA:  Seizure. EXAM: PORTABLE CHEST 1 VIEW COMPARISON:  None. FINDINGS: Patchy opacities in the right greater than left mid lower lung zones. Lung volumes are low. Heart is normal in size. Normal mediastinal contours for rotation. No pulmonary edema, pleural effusion or pneumothorax. No acute osseous abnormalities. IMPRESSION: Patchy opacities in the right greater than left mid lower lung zones, may be suspicious for aspiration in the setting of seizure, pneumonia also considered. Electronically Signed   By: Keith Rake M.D.   On: 07/12/2018 20:43    Pending Labs Unresulted Labs (From admission, onward)    Start     Ordered   07/13/18 0500  CBC  Tomorrow morning,   R     07/12/18 2231   07/13/18 4259  Basic metabolic panel  Tomorrow morning,   R     07/12/18 2231   07/12/18 2231  Magnesium  Add-on,   AD     07/12/18 2231   07/12/18 2229  HIV antibody (Routine Testing)  Once,   STAT     07/12/18 2231   07/12/18 2056  Culture, blood (routine x 2)  BLOOD CULTURE X 2,   STAT     07/12/18 2055   07/12/18 2056  Lactic acid, plasma  Now then every 2 hours,   STAT     07/12/18 2055   07/12/18 2055  Novel Coronavirus,NAA,(SEND-OUT TO REF LAB - TAT 24-48 hrs); Hosp Order  (Asymptomatic Patients Labs)  Once,   STAT    Question:  Rule Out  Answer:  Yes   07/12/18 2054           Vitals/Pain Today's Vitals   07/12/18 2030 07/12/18 2034 07/12/18 2115 07/12/18 2205  BP:  (!) 137/95 (!) 161/92 106/78  Pulse: 93  100 83  Resp: (!) 21 (!) 30 (!) 25 18  Temp:    98.1 F (36.7 C)  TempSrc:    Oral  SpO2: 98% 100% 99% 96%  Weight:    65.6 kg  Height:    5\' 8"  (1.727 m)    Isolation Precautions No active isolations  Medications Medications  sodium chloride 0.9 % bolus 1,000 mL (0 mLs Intravenous Stopped 07/12/18 2241)    And  0.9 %  sodium chloride infusion ( Intravenous Transfusing/Transfer 07/12/18 2242)  levETIRAcetam (KEPPRA) IVPB 1000 mg/100 mL premix (1,000 mg Intravenous Transfusing/Transfer 07/12/18 2242)  levETIRAcetam (KEPPRA) IVPB 500 mg/100 mL premix (has no administration in time range)  0.9 %  sodium chloride infusion (has no administration in time range)  enoxaparin (LOVENOX) injection 40 mg (has no administration in time range)  LORazepam (ATIVAN) injection 1-2 mg (has no administration in time range)  acetaminophen (TYLENOL) tablet 650 mg (has no administration in time range)    Or  acetaminophen (TYLENOL) suppository 650 mg (has no administration in time range)  Ampicillin-Sulbactam (UNASYN)  3 g in sodium chloride 0.9 % 100 mL IVPB (has no administration in time range)  levETIRAcetam (KEPPRA) IVPB 1000 mg/100 mL premix (0 mg Intravenous Stopped 07/12/18 2030)  LORazepam (ATIVAN) injection 1 mg (1 mg Intravenous Given 07/12/18 2029)  piperacillin-tazobactam (ZOSYN) IVPB 3.375 g (0 g Intravenous Stopped 07/12/18 2237)    Mobility walks High fall risk   Focused Assessments Neuro Assessment Handoff:  Swallow screen pass? Yes      Last date known well: 07/12/18   Neuro Assessment: Exceptions to WDL Neuro Checks:      Last Documented NIHSS Modified Score:   Has TPA been given? No If patient is a Neuro Trauma and patient is going to OR before floor call report to 4N Charge nurse: [phone] or [phone]     R Recommendations:  See Admitting Provider Note  Report given to:   Additional Notes:

## 2018-07-12 NOTE — ED Notes (Signed)
Please call pt's mother Bartlomiej Jenkinson to give her an update on her son. [phone]

## 2018-07-12 NOTE — ED Triage Notes (Signed)
Pt. BIB EMS from home due to seizure activity. Family reports 2-3 seizures. Per EMS 2 episodes of seizures with 10 mg versed given.   EMS VS CBG 116 BP 130/70 RR 30 C Collar present to maintain airway  15L

## 2018-07-13 ENCOUNTER — Observation Stay (HOSPITAL_COMMUNITY): Payer: Medicare HMO

## 2018-07-13 ENCOUNTER — Encounter (HOSPITAL_COMMUNITY): Payer: Self-pay | Admitting: *Deleted

## 2018-07-13 DIAGNOSIS — J690 Pneumonitis due to inhalation of food and vomit: Secondary | ICD-10-CM | POA: Diagnosis not present

## 2018-07-13 DIAGNOSIS — R9082 White matter disease, unspecified: Secondary | ICD-10-CM | POA: Diagnosis not present

## 2018-07-13 DIAGNOSIS — G9341 Metabolic encephalopathy: Secondary | ICD-10-CM

## 2018-07-13 DIAGNOSIS — G40919 Epilepsy, unspecified, intractable, without status epilepticus: Secondary | ICD-10-CM | POA: Diagnosis not present

## 2018-07-13 LAB — CBC
HCT: 41.9 % (ref 39.0–52.0)
Hemoglobin: 14.1 g/dL (ref 13.0–17.0)
MCH: 32.1 pg (ref 26.0–34.0)
MCHC: 33.7 g/dL (ref 30.0–36.0)
MCV: 95.4 fL (ref 80.0–100.0)
Platelets: 203 10*3/uL (ref 150–400)
RBC: 4.39 MIL/uL (ref 4.22–5.81)
RDW: 13.5 % (ref 11.5–15.5)
WBC: 17.8 10*3/uL — ABNORMAL HIGH (ref 4.0–10.5)
nRBC: 0 % (ref 0.0–0.2)

## 2018-07-13 LAB — BASIC METABOLIC PANEL
Anion gap: 9 (ref 5–15)
BUN: 8 mg/dL (ref 6–20)
CO2: 21 mmol/L — ABNORMAL LOW (ref 22–32)
Calcium: 8.2 mg/dL — ABNORMAL LOW (ref 8.9–10.3)
Chloride: 108 mmol/L (ref 98–111)
Creatinine, Ser: 1.03 mg/dL (ref 0.61–1.24)
GFR calc Af Amer: >60 mL/min (ref >60)
GFR calc non Af Amer: >60 mL/min (ref >60)
Glucose, Bld: 112 mg/dL — ABNORMAL HIGH (ref 70–99)
Potassium: 3.7 mmol/L (ref 3.5–5.1)
Sodium: 138 mmol/L (ref 135–145)

## 2018-07-13 LAB — MAGNESIUM: Magnesium: 1.7 mg/dL (ref 1.7–2.4)

## 2018-07-13 LAB — LACTIC ACID, PLASMA
Lactic Acid, Venous: 2.2 mmol/L (ref 0.5–1.9)
Lactic Acid, Venous: 1.9 mmol/L (ref 0.5–1.9)

## 2018-07-13 LAB — HIV ANTIBODY (ROUTINE TESTING W REFLEX): HIV Screen 4th Generation wRfx: NONREACTIVE

## 2018-07-13 MED ORDER — GADOBUTROL 1 MMOL/ML IV SOLN
7.0000 mL | Freq: Once | INTRAVENOUS | Status: AC | PRN
  Administered 2018-07-13: 7 mL via INTRAVENOUS

## 2018-07-13 MED ORDER — LEVETIRACETAM 500 MG PO TABS: 500.0000 mg | ORAL_TABLET | Freq: Two times a day (BID) | ORAL | 3 refills | Status: DC

## 2018-07-13 MED ORDER — SODIUM CHLORIDE 0.9 % IV BOLUS
1000.0000 mL | Freq: Once | INTRAVENOUS | Status: AC
  Administered 2018-07-13: 1000 mL via INTRAVENOUS

## 2018-07-13 MED ORDER — AMOXICILLIN-POT CLAVULANATE 875-125 MG PO TABS: 1.0000 | ORAL_TABLET | Freq: Two times a day (BID) | ORAL | 0 refills | Status: AC

## 2018-07-13 MED FILL — levETIRAcetam 500 MG TABS: 500 | 30 days supply | Qty: 60 | Fill #0

## 2018-07-13 MED FILL — AMOX-CLAV 875-125 MG TABLET: 875-125 | 7 days supply | Qty: 14 | Fill #0

## 2018-07-13 NOTE — Progress Notes (Signed)
Pt remained obtunded during shift; around 0430 whiles changing pt d/t incontinence, pt had a brief eye opening but very drowsy and went back to sleeping. Reported off to oncoming RN. Delia Heady RN

## 2018-07-13 NOTE — Progress Notes (Signed)
Patient is more alert at this time, pleasant and cooperative. Denied pain. EEG completed/pedning. Updated mom. Will continue to monitor.  Ave Filter, RN

## 2018-07-13 NOTE — Progress Notes (Signed)
Pt admitted to the unit from ED; pt obtunded on arrival to the unit; response/move to painful stimulation; telemetry applied and verified with CCMD: NT called to second verify; VSS: seizure precaution initiated; pt had airway tube and collar on upon arrival to the unit; MD notified and ordered to remove; pt remains on 2l oxygen [place]; IV intact and transfusing; condom cath remains intact; skin tact with small pin opening noted to buttock cheek and foam dsg applied; call light within reach; bed alarm on; will continue to closely monitor. Francis Gaines Jaskarn Schweer RN   07/12/18 2316  Vitals  Temp 99.3 F (37.4 C)  Temp Source Oral  BP 130/75  MAP (mmHg) 89  BP Location Right Arm  BP Method Automatic  Patient Position (if appropriate) Lying  Pulse Rate 91  Pulse Rate Source Monitor  Resp 18  Oxygen Therapy  SpO2 100 %  O2 Device Nasal Cannula  O2 Flow Rate (L/min) 2 L/min  Pain Assessment  Pain Scale Faces  Faces Pain Scale 0  Height and Weight  Weight 70.2 kg  BMI (Calculated) 23.54  MEWS Score  MEWS RR 0  MEWS Pulse 0  MEWS Systolic 0  MEWS LOC 2  MEWS Temp 0  MEWS Score 2  MEWS Score Color Yellow

## 2018-07-13 NOTE — Discharge Summary (Signed)
Physician Discharge Summary   Patient ID: Miguel Black MRN: [id number] DOB/AGE: [birth year] 28 y.o.  Admit date: 07/12/2018 Discharge date: 07/13/2018  Primary Care Physician:  Patient, No Pcp Per   Recommendations for Outpatient Follow-up:  1. Patient recommended to follow-up with his neurologist at [facility] in 2 weeks.  Home Health: None  Equipment/Devices: None  Discharge Condition: stable  CODE STATUS: FULL  Diet recommendation: Heart healthy diet   Discharge Diagnoses:    . Status epilepticus ([place]) History of MS   Consults: Neurology    Allergies:  No Known Allergies   DISCHARGE MEDICATIONS: Allergies as of 07/13/2018   No Known Allergies     Medication List    TAKE these medications   amoxicillin-clavulanate 875-125 MG tablet Commonly known as: Augmentin Take 1 tablet by mouth 2 (two) times daily for 7 days.   levETIRAcetam 500 MG tablet Commonly known as: Keppra Take 1 tablet (500 mg total) by mouth 2 (two) times daily.   Vitamin D-1000 Max St 25 MCG (1000 UT) tablet Generic drug: Cholecalciferol Take 1,000 Units by mouth daily.        Brief H and P: For complete details please refer to admission H and P, but in briefDevante S Black is a 28 y.o. male with a past medical history of seizure, MS presenting to the hospital for evaluation of seizures. Per EMS, patient had 2-3 seizures at home and additional 2 seizures in route and was given 10 mg Versed.  Family reported history of a seizure 2 years ago in [place].  He was evaluated in the hospital at that time and discharged home on Keppra.  He was on Keppra briefly.  He has MS and is followed at [facility] by Dr. Arvella Nigh.  [facility] [place] [facility] Course:   Status epilepticus with history of seizures, in the setting of MS, THC use  -CT head was negative for acute findings UDS positive for benzodiazepines and [facility] was consulted, felt THC may have a  pro convulsant effect, patient has had a history of seizures in the past and has been on Keppra remotely -Patient was loaded with Keppra IV, then subsequently placed on Keppra 500 mg twice daily. Patient was recommended to be compliant with keppra, no driving for 6 months and other instructions as below.  EEG was normal   Aspiration pneumonia Likely due to seizures, chest x-ray showed patchy opacities in the right greater than left mid lower lung zones Patient was placed on supplemental O2, received a dose of Zosyn in ED and then subsequently placed on Unasyn He is transitioned to oral Augmentin at the time of discharge.  History of MS Patient follows neurology, Dr. Arvella [facility]igh at [facility], he was switched to [place] IV few months ago.  Patient was recommended to follow-up with his neurologist in 2 weeks.  Day of Discharge S: No further seizures since admission, alert and oriented, tolerating diet. Meds delivered by TOC   BP 111/66 (BP Location: Right Arm)   Pulse 94   Temp 99.8 F (37.7 C) (Oral) Comment: RN Notified  Resp 16   Ht 5\' 8"  (1.727 m)   Wt 70.2 kg   SpO2 100%   BMI 23.53 kg/m   Physical Exam: General: Alert and awake oriented x3 not in any acute distress. HEENT: anicteric sclera, pupils reactive to light and accommodation CVS: S1-S2 clear no murmur rubs or gallops Chest: clear to auscultation bilaterally,  no wheezing rales or rhonchi Abdomen: soft nontender, nondistended, normal bowel sounds Extremities: no cyanosis, clubbing or edema noted bilaterally Neuro: Cranial nerves II-XII intact, no focal neurological deficits   The results of significant diagnostics from this hospitalization (including imaging, microbiology, ancillary and laboratory) are listed below for reference.      Procedures/Studies:  Ct Head Wo Contrast  Result Date: 07/12/2018 CLINICAL DATA:  Seizure EXAM: CT HEAD WITHOUT CONTRAST TECHNIQUE: Contiguous axial images were obtained  from the base of the skull through the vertex without intravenous contrast. COMPARISON:  None. FINDINGS: Brain: No evidence of acute infarction, hemorrhage, hydrocephalus, extra-axial collection or mass lesion/mass effect. Vascular: No hyperdense vessel or unexpected calcification. Skull: Normal. Negative for fracture or focal lesion. Sinuses/Orbits: Mucous retention cyst in the right maxillary sinus. Minimal mucosal thickening in the sphenoid and ethmoid sinus Other: None IMPRESSION: Negative non contrasted CT appearance of the brain. Electronically Signed   By: Jasmine PangKim  Fujinaga M.D.   On: 07/12/2018 21:19   Mr Miguel JeanBrain W ZOWo Contrast  Result Date: 07/13/2018 CLINICAL DATA:  28 y/o M; history of seizure and multiple sclerosis presenting to the hospital for evaluation of seizures. EXAM: MRI HEAD WITHOUT AND WITH CONTRAST TECHNIQUE: Multiplanar, multiecho pulse sequences of the brain and surrounding structures were obtained without and with intravenous contrast. CONTRAST:  7 cc Gadavist COMPARISON:  07/12/2018 CT head. FINDINGS: Brain: Numerous white matter lesions are present throughout the brain within the bilateral cerebellum, right posterior medulla, scattered throughout the pons, bilateral cerebral peduncles, periaqueductal gray matter, multiple foci in the thalami, left posterior limb and genu of internal capsule, periventricular white matter, corpus callosum, and juxta cortical white matter. No abnormal susceptibility hypointensity to indicate intracranial hemorrhage. No reduced diffusion. Midline structures are intact including a morphologically normal pituitary as well as a complete corpus callosum and vermis. No Chiari malformation. No evidence of cortical dysplasia, heterotopia, or disorder of cortical formation is identified. Hippocampi by are symmetric in size and signal bilaterally. After administration of intravenous contrast there is no abnormal enhancement. Vascular: Normal flow voids. Skull and upper  cervical spine: Normal marrow signal. Sinuses/Orbits: Right maxillary sinus mucous retention cyst. Otherwise included paranasal sinuses and the mastoid air cells are otherwise unremarkable. Orbits are normal. Other: None. IMPRESSION: 1. Advanced white matter disease consistent with history of multiple sclerosis. No enhancement to suggest active demyelination at this time. 2. No acute process or structural cause of seizure identified. Electronically Signed   By: Mitzi HansenLance  Furusawa-Stratton M.D.   On: 07/13/2018 01:56   Dg Chest Port 1 View  Result Date: 07/12/2018 CLINICAL DATA:  Seizure. EXAM: PORTABLE CHEST 1 VIEW COMPARISON:  None. FINDINGS: Patchy opacities in the right greater than left mid lower lung zones. Lung volumes are low. Heart is normal in size. Normal mediastinal contours for rotation. No pulmonary edema, pleural effusion or pneumothorax. No acute osseous abnormalities. IMPRESSION: Patchy opacities in the right greater than left mid lower lung zones, may be suspicious for aspiration in the setting of seizure, pneumonia also considered. Electronically Signed   By: Narda RutherfordMelanie  Sanford M.D.   On: 07/12/2018 20:43      LAB RESULTS: Basic Metabolic Panel: Recent Labs  Lab 07/12/18 2024 07/12/18 2319 07/13/18 0219  NA 138  --  138  K 4.3  --  3.7  CL 105  --  108  CO2 19*  --  21*  GLUCOSE 114*  --  112*  BUN 9  --  8  CREATININE 1.10  --  1.03  CALCIUM 9.0  --  8.2*  MG  --  1.7  --    Liver Function Tests: No results for input(s): AST, ALT, ALKPHOS, BILITOT, PROT, ALBUMIN in the last 168 hours. No results for input(s): LIPASE, AMYLASE in the last 168 hours. No results for input(s): AMMONIA in the last 168 hours. CBC: Recent Labs  Lab 07/12/18 2024 07/13/18 0219  WBC 15.4* 17.8*  NEUTROABS 13.2*  --   HGB 15.3 14.1  HCT 45.5 41.9  MCV 96.0 95.4  PLT 271 203   Cardiac Enzymes: No results for input(s): CKTOTAL, CKMB, CKMBINDEX, TROPONINI in the last 168  hours. BNP: Invalid input(s): POCBNP CBG: Recent Labs  Lab 07/12/18 2013  GLUCAP 114*      Disposition and Follow-up: Discharge Instructions    Diet - low sodium heart healthy   Complete by: As directed    Discharge instructions   Complete by: As directed    Per [facility] statutes, patients with seizures are not allowed to drive until they have been seizure-free for six months.   Use caution when using heavy equipment or power tools. Avoid working on ladders or at heights. Take showers instead of baths. Ensure the water temperature is not too high on the home water heater. Do not go swimming alone. Do not lock yourself in a room alone (i.e. bathroom). When caring for infants or small children, sit down when holding, feeding, or changing them to minimize risk of injury to the child in the event you have a seizure. Maintain good sleep hygiene. Avoid alcohol.   Please follow with your neurologist for clearance for resuming driving.   Increase activity slowly   Complete by: As directed        DISPOSITION: Home   DISCHARGE FOLLOW-UP Follow-up Information    Zeid, Morton Peters, MD. Schedule an appointment as soon as possible for a visit in 2 week(s).   Specialty: Psychiatry Why: Please follow-up with your neurologist at [facility] Contact information: [address] [place] [address] [phone]        [facility] Follow up.   Why: [phone] please call to make a follow up apt in 7 seven days  Contact information: [address] [place] [address]           Time coordinating discharge:  35 minutes  Signed:   Thad Ranger M.D. [facility] 07/13/2018, 4:58 PM

## 2018-07-13 NOTE — Progress Notes (Addendum)
Neurology Progress Note   Subjective Seen and examined.  No seizures overnight.  O:// Current vital signs: BP 119/70 (BP Location: Left Arm)   Pulse 93   Temp 99.3 F (37.4 C) (Oral)   Resp 20   Ht 5\' 8"  (1.727 m)   Wt 70.2 kg   SpO2 100%   BMI 23.53 kg/m  Vital signs in last 24 hours: Temp:  [98.1 F (36.7 C)-99.3 F (37.4 C)] 99.3 F (37.4 C) (06/19 0855) Pulse Rate:  [79-109] 93 (06/19 0855) Resp:  [18-30] 20 (06/19 0855) BP: (106-161)/(70-98) 119/70 (06/19 0855) SpO2:  [96 %-100 %] 100 % (06/19 0855) Weight:  [65.6 kg-70.2 kg] 70.2 kg (06/18 2316) Neurological exam Patient is drowsy, when asked to wake up he wakes up and is then, alert, oriented to self. He follows commands. He is dysarthric and his speech has a scanning character. There is no evidence of aphasia but he has poor attention concentration. Cranial nerves: Pupils equal round reactive to light, blinks to threat from both sides, extraocular movements with broken smooth pursuit and inability to abduct left eye completely, visual fields are full, face is symmetric, facial sensations intact, tongue and palate midline. Motor exam: He has mildly increased tone in all 4 extremities.  Right upper and lower extremity 5/5.  Left upper and lower extremity-left upper extremity is 4/5, left lower extremity is 4-/5 Sensory exam: Intact light touch DTRs: Brisk all over with sustained clonus in both ankles. Upgoing toes  Medications  Current Facility-Administered Medications:  .  [COMPLETED] sodium chloride 0.9 % bolus 1,000 mL, 1,000 mL, Intravenous, Once, Stopped at 07/12/18 2241 **AND** 0.9 %  sodium chloride infusion, , Intravenous, Continuous, Shela Leff, MD, Last Rate: 125 mL/hr at 07/12/18 2241 .  acetaminophen (TYLENOL) tablet 650 mg, 650 mg, Oral, Q4H PRN **OR** acetaminophen (TYLENOL) suppository 650 mg, 650 mg, Rectal, Q4H PRN, Shela Leff, MD .  Ampicillin-Sulbactam (UNASYN) 3 g in sodium  chloride 0.9 % 100 mL IVPB, 3 g, Intravenous, Q6H, Rumbarger, Valeda Malm, RPH, Last Rate: 200 mL/hr at 07/13/18 0615 .  enoxaparin (LOVENOX) injection 40 mg, 40 mg, Subcutaneous, Daily, Shela Leff, MD, 40 mg at 07/13/18 0853 .  levETIRAcetam (KEPPRA) IVPB 500 mg/100 mL premix, 500 mg, Intravenous, Q12H, Shela Leff, MD, Last Rate: 400 mL/hr at 07/13/18 0852, 500 mg at 07/13/18 0852 .  LORazepam (ATIVAN) injection 1-2 mg, 1-2 mg, Intravenous, Q2H PRN, Shela Leff, MD Labs CBC    Component Value Date/Time   WBC 17.8 (H) 07/13/2018 0219   RBC 4.39 07/13/2018 0219   HGB 14.1 07/13/2018 0219   HCT 41.9 07/13/2018 0219   PLT 203 07/13/2018 0219   MCV 95.4 07/13/2018 0219   MCH 32.1 07/13/2018 0219   MCHC 33.7 07/13/2018 0219   RDW 13.5 07/13/2018 0219   LYMPHSABS 1.1 07/12/2018 2024   MONOABS 1.0 07/12/2018 2024   EOSABS 0.0 07/12/2018 2024   BASOSABS 0.0 07/12/2018 2024    CMP     Component Value Date/Time   NA 138 07/13/2018 0219   K 3.7 07/13/2018 0219   CL 108 07/13/2018 0219   CO2 21 (L) 07/13/2018 0219   GLUCOSE 112 (H) 07/13/2018 0219   BUN 8 07/13/2018 0219   CREATININE 1.03 07/13/2018 0219   CALCIUM 8.2 (L) 07/13/2018 0219   GFRNONAA >60 07/13/2018 0219   GFRAA >60 07/13/2018 0219     Imaging I have reviewed images in epic and the results pertinent to this consultation are: MRI of  the brain with and without contrast shows advanced white matter disease and lesions consistent with a history of demyelinating disease with no enhancement to suggest active demyelination at this time.  Assessment: 28 year old man with a history of multiple sclerosis, seizures presents with 2 seizures at home.  On initial examination he was very somnolent and was admitted because of multiple seizures and prolonged postictal state. No structural cause for his seizures has been identified. He does not appear to be having a MS exacerbation on imaging. There was history  provided of him being on marijuana, which could be pro convulsant if some of the synthetic forms are used. That said, the history of MS might in itself make him more prone for seizures and no medications or noncompliance could be reasons for breakthrough seizures. With him being on immunomodulator therapy with ocrelizumab, CNS infections can also be a possibility but looking at his brain MRI, it is not suggestive of HSV encephalitis or PML.  IMPRESSION Breakthrough seizure and known seizure patient Multiple sclerosis-not in exacerbation  Recommendations: Continue Keppra 500 twice daily EEG completed-awaiting results Seizure precautions Frequent neurochecks Check electrolytes-especially CMP, magnesium and phosphorus.  Replete as necessary. [facility]  --Per [facility] statutes, patients with seizures are not allowed to drive until they have been seizure-free for six months.   Use caution when using heavy equipment or power tools. Avoid working on ladders or at heights. Take showers instead of baths. Ensure the water temperature is not too high on the home water heater. Do not go swimming alone. Do not lock yourself in a room alone (i.e. bathroom). When caring for infants or small children, sit down when holding, feeding, or changing them to minimize risk of injury to the child in the event you have a seizure. Maintain good sleep hygiene. Avoid alcohol.   If patient has another seizure, call 911 and bring them back to the ED if: A. The seizure lasts longer than 5 minutes.  B. The patient doesn't wake shortly after the seizure or has new problems such as difficulty seeing, speaking or moving following the seizure C. The patient was injured during the seizure D. The patient has a temperature over 102 F (39C) E. The patient vomited during the seizure and now is having trouble breathing   -- Milon Dikes,  MD [facility] Pager: [phone] If 7pm to 7am, please call on call as listed on AMION.

## 2018-07-13 NOTE — Progress Notes (Signed)
Lab called RN to report pt lactic acid level of 2.2; MD notified and new order received. Will continue to closely monitor. Delia Heady RN

## 2018-07-13 NOTE — Progress Notes (Signed)
EEG Completed; Results Pending  

## 2018-07-13 NOTE — Progress Notes (Signed)
Discharge instruction reviewed with patient and mom. All questions answered at this time. All belongings sent with patient.  Ave Filter, RN

## 2018-07-13 NOTE — Procedures (Signed)
  [place] A. Merlene Laughter, MD     [email or web address]           HISTORY: This is a 28 year old who presents with seizures and encephalopathy.  MEDICATIONS:  Current Facility-Administered Medications:  .  [COMPLETED] sodium chloride 0.9 % bolus 1,000 mL, 1,000 mL, Intravenous, Once, Stopped at 07/12/18 2241 **AND** 0.9 %  sodium chloride infusion, , Intravenous, Continuous, Shela Leff, MD, Last Rate: 125 mL/hr at 07/12/18 2241 .  acetaminophen (TYLENOL) tablet 650 mg, 650 mg, Oral, Q4H PRN **OR** acetaminophen (TYLENOL) suppository 650 mg, 650 mg, Rectal, Q4H PRN, Shela Leff, MD .  Ampicillin-Sulbactam (UNASYN) 3 g in sodium chloride 0.9 % 100 mL IVPB, 3 g, Intravenous, Q6H, Rumbarger, Valeda Malm, RPH, Stopped at 07/13/18 1042 .  enoxaparin (LOVENOX) injection 40 mg, 40 mg, Subcutaneous, Daily, Shela Leff, MD, 40 mg at 07/13/18 0853 .  levETIRAcetam (KEPPRA) IVPB 500 mg/100 mL premix, 500 mg, Intravenous, Q12H, Shela Leff, MD, Stopped at 07/13/18 0925 .  LORazepam (ATIVAN) injection 1-2 mg, 1-2 mg, Intravenous, Q2H PRN, Shela Leff, MD     ANALYSIS: A 16 channel recording using standard 10 20 measurements is conducted for 27 minutes.   There is a background rhythm of 9.5 hertz which attenuates with eye-opening.  There is beta activity observed in the frontal areas.  Awake and drowsy architecture seen transitioning to spindles and K complexes indicative of stage II non-REM sleep.  There is excessive spindling noted however.  There is slightly increased myogenic artifact seen throughout the recording.  Photic stimulation is carried out without abnormal changes in the background activity.  There is no focal or lateralized slowing.  There is no epileptiform activity is noted.   IMPRESSION: 1,   This recording of awake and sleep states is essentially unremarkable.  There is increased tingling likely due to effect from benzodiazepines.   Otherwise, no epileptiform discharges are noted.      Matia Zelada A. Merlene Laughter, M.D.  Diplomate, Tax adviser of Psychiatry and Neurology ( Neurology).

## 2018-07-13 NOTE — Plan of Care (Signed)
  Problem: Education: Goal: Expressions of having a comfortable level of knowledge regarding the disease process will increase Outcome: Completed/Met   Problem: Coping: Goal: Ability to adjust to condition or change in health will improve Outcome: Completed/Met Goal: Ability to identify appropriate support needs will improve Outcome: Completed/Met   Problem: Health Behavior/Discharge Planning: Goal: Compliance with prescribed medication regimen will improve Outcome: Completed/Met   Problem: Medication: Goal: Risk for medication side effects will decrease Outcome: Completed/Met   Problem: Clinical Measurements: Goal: Complications related to the disease process, condition or treatment will be avoided or minimized Outcome: Completed/Met Goal: Diagnostic test results will improve Outcome: Completed/Met   Problem: Safety: Goal: Verbalization of understanding the information provided will improve Outcome: Completed/Met   Problem: Self-Concept: Goal: Level of anxiety will decrease Outcome: Completed/Met Goal: Ability to verbalize feelings about condition will improve Outcome: Completed/Met

## 2018-07-14 LAB — NOVEL CORONAVIRUS, NAA (HOSP ORDER, SEND-OUT TO REF LAB; TAT 18-24 HRS): SARS-CoV-2, NAA: NOT DETECTED

## 2018-07-17 LAB — CULTURE, BLOOD (ROUTINE X 2)
Culture: NO GROWTH
Culture: NO GROWTH

## 2018-08-07 DIAGNOSIS — G35 Multiple sclerosis: Secondary | ICD-10-CM | POA: Diagnosis not present

## 2019-01-07 ENCOUNTER — Ambulatory Visit (INDEPENDENT_AMBULATORY_CARE_PROVIDER_SITE_OTHER): Payer: Medicare HMO | Admitting: Family Medicine

## 2019-01-07 DIAGNOSIS — Z8669 Personal history of other diseases of the nervous system and sense organs: Secondary | ICD-10-CM | POA: Diagnosis not present

## 2019-01-07 DIAGNOSIS — G35 Multiple sclerosis: Secondary | ICD-10-CM

## 2019-01-07 DIAGNOSIS — G35D Multiple sclerosis, unspecified: Secondary | ICD-10-CM

## 2019-01-07 NOTE — Progress Notes (Signed)
Virtual Visit via Telephone Note  I connected with Miguel Black on 01/07/19 at  2:30 PM EST by telephone and verified that I am speaking with the correct person using two identifiers.   I discussed the limitations, risks, security and privacy concerns of performing an evaluation and management service by telephone and the availability of in person appointments. I also discussed with the patient that there may be a patient responsible charge related to this service. The patient expressed understanding and agreed to proceed.  Patient Location: Home Provider Location: [facility] Others participating in call: none   History of Present Illness:      28 yo male who reports that he was diagnosed with Multiple Sclerosis in 2015 and then in 2017 unexpectedly had a seizure (lived in [place]) and he was placed on Keppra but only took it for 1 month and 1/2 half.  Patient did not have another seizure until June of this year.  He reports that he is not currently on any medication for prevention of seizures.        He currently sees a neurologist at [facility] in [place] in follow-up of his multiple sclerosis and he receives infusion treatment, Ocrevus.  His next infusion will be in early January.  He reports that he currently has issues with his gait and balance due to left-sided weakness as a result of the multiple sclerosis.  He uses a cane to help with ambulation.  He also has issues with blurred vision in his left eye as a result of his multiple sclerosis.  He denies any issues with bladder or bowel dysfunction.        He denies any chest pain or palpitations, no shortness of breath or cough.  He does currently smoke about 3 cigars a day along with use of marijuana when available.  He denies any current abdominal pain-no nausea/vomiting/diarrhea or constipation.  No current peripheral edema.  He denies any difficulty with swallowing.  He is interested in finding a neurologist  for treatment of multiple sclerosis and seizures here in the [place] area.           Past Medical History:  Diagnosis Date  . MS (multiple sclerosis) (HCC)   . Seizure [facility])     History reviewed. No pertinent surgical history.  Family History  Problem Relation Age of Onset  . Hypertension Mother   . Healthy Father   . Healthy Sister   . Healthy Brother   . Diabetes Maternal Grandmother   . Hypertension Maternal Grandfather   . Hodgkin's lymphoma Brother   . Colon cancer Maternal Uncle 27    Social History   Tobacco Use  . Smoking status: Current Some Day Smoker  . Smokeless tobacco: Never Used  Substance Use Topics  . Alcohol use: Yes  . Drug use: Yes    Types: Marijuana     No Known Allergies     Observations/Objective: No vital signs or physical exam conducted as visit was done via telephone  Assessment and Plan: 1. MS (multiple sclerosis) (HCC) On review of chart, patient is followed by Dr. Mercie Eon at [facility] and patient reports that he has been taking IV Ocrevus and has upcoming appointment for infusion in January.  Patient however would like to have a closer neurologist in the [place] area for continued follow-up of his MS.  Referral will be made locally per patient was made aware that it may take time for the referral  and he should keep his upcoming appointment for infusion and to call this office if he has not been contacted within the next 2 weeks regarding neurology appointment.  He was made aware that he should receive a call regarding referral however the actual appointment may not occur until late January or February.  2. History of seizure disorder Patient reports that he is not currently taking Keppra.  He was prescribed Keppra per his report after having a seizure while living in [place] but stopped the medication after about 1 and half months of use.  On review of medical records, patient had hospital admission on 07/12/2018 after  having 2-3 seizures at home as well as 2 seizures during ED transport.  Neurology was consulted during his hospitalization and patient was loaded with IV Keppra and then placed on Keppra 500 mg twice daily at discharge.  He reports that he has not been taking the Keppra.  Patient will be referred to neurology for further evaluation and treatment of seizure disorder and multiple sclerosis.  Follow Up Instructions:. 7-8 weeks and as needed to establish care with the provider at this office    I discussed the assessment and treatment plan with the patient. The patient was provided an opportunity to ask questions and all were answered. The patient agreed with the plan and demonstrated an understanding of the instructions.   The patient was advised to call back or seek an in-person evaluation if the symptoms worsen or if the condition fails to improve as anticipated.  I provided 12 minutes of non-face-to-face time during this encounter.  An additional 10 minutes was spent with review of medical records and placement of orders.   [name], MD

## 2019-01-22 ENCOUNTER — Encounter: Payer: Self-pay | Admitting: Neurology

## 2019-01-22 ENCOUNTER — Other Ambulatory Visit: Payer: Self-pay

## 2019-01-22 ENCOUNTER — Ambulatory Visit (INDEPENDENT_AMBULATORY_CARE_PROVIDER_SITE_OTHER): Payer: Medicare HMO | Admitting: Neurology

## 2019-01-22 VITALS — BP 124/72 | HR 74 | Temp 97.9°F | Ht 68.0 in | Wt 158.0 lb

## 2019-01-22 DIAGNOSIS — R269 Unspecified abnormalities of gait and mobility: Secondary | ICD-10-CM | POA: Diagnosis not present

## 2019-01-22 DIAGNOSIS — G35 Multiple sclerosis: Secondary | ICD-10-CM

## 2019-01-22 DIAGNOSIS — R531 Weakness: Secondary | ICD-10-CM | POA: Diagnosis not present

## 2019-01-22 DIAGNOSIS — R5383 Other fatigue: Secondary | ICD-10-CM | POA: Diagnosis not present

## 2019-01-22 DIAGNOSIS — R569 Unspecified convulsions: Secondary | ICD-10-CM

## 2019-01-22 DIAGNOSIS — R946 Abnormal results of thyroid function studies: Secondary | ICD-10-CM | POA: Diagnosis not present

## 2019-01-22 DIAGNOSIS — E559 Vitamin D deficiency, unspecified: Secondary | ICD-10-CM

## 2019-01-22 DIAGNOSIS — Z79899 Other long term (current) drug therapy: Secondary | ICD-10-CM

## 2019-01-22 DIAGNOSIS — R799 Abnormal finding of blood chemistry, unspecified: Secondary | ICD-10-CM | POA: Diagnosis not present

## 2019-01-22 HISTORY — DX: Unspecified convulsions: R56.9

## 2019-01-22 MED ORDER — ZONISAMIDE 100 MG PO CAPS: ORAL_CAPSULE | ORAL | 11 refills | Status: DC

## 2019-01-22 NOTE — Progress Notes (Signed)
[facility]  PATIENT: Miguel AlkenDevante S Black DOB: 07/19/1990  REFERRING DOCTOR OR PCP: Cain Saupeammie Fulp, MD SOURCE: Patient, notes from [facility], imaging report, MRI of the brain personally reviewed.  _________________________________   HISTORICAL  CHIEF COMPLAINT:  Chief Complaint  Patient presents with  . New Patient (Initial Visit)    Rm 12 with mom ( temp 97.4)  . Multiple Sclerosis    Dx in 06/2013 started on tecfidera then went to tysabri curently taking ocrevus started in 2019. Last infusion 08/07/2018-  Reports ocrevus has been working well no new sx   . Seizures    First seizure was in feb 2018 and then the second was in June of 2020 not on any seizure meds currently.     HISTORY OF PRESENT ILLNESS:  I had the pleasure of seeing your patient, Miguel Black, at the MS center at [facility] for neurologic consultation regarding his multiple sclerosis.  He was diagnosed with relapsing remitting MS in June 2015 after presenting with reduced vision OS while [place]   In retrospect he had had some neurologic symptoms a few years before the disgnosis.   He had trouble running before his diagnosis.    He was placed on Tecfidera but he had breakthrough activity.     Just a year later, he was switched to Tysabri and has done better.   It appears as though some of his symptoms came on more suddenly and other symptoms progressed over time.  [place] and started seeing the [facility] (Dr. Gaynelle AduAbou Zeid).   He has not had any exacerbations and feels his MS has been stable.   Initially, he continue Tysabri.  He tolerated Tysabri well but going back and forth to an infusion center monthly with difficulty.    He has been JCV Ab negative.   His last infusion was off Tysabri was in summer 2019 and he switched to Ocrevus at the end of 2019.  His last infusion was August 07, 2018.    He is scheduled to have  another one at [facility] January 12 but he prefers to have infusions closer to home.    His last JCV Ab test I have a result for is 07/04/2017 (negative 0.14).   Currently, he is walking with a cane outdoors but walks without it indoors.   He started using the cane in 2016 but felt he should have started using it in 2015.     His left leg and arm are weaker than the right.    He also has spasticity on the left.    His left arm is slowed and mildly weak.   Bladder function is fine.       He notes vision is blurry on the left and he has color desaturation out of the left eye.   His right eye has good vision.      He notes fatigue that is minimal in the morning but much worse later in the day.   Fatigue is physical > cognitive.   He sleeps well.    He feels focus and attention are good though cognitive skills sometimes are a little off.   He feels mood is fine.      He has had two seizures.    His first one was 2018 and second one was 07/12/2018.   He notes being dehydrated the second one.    Both  seizures were GTC by description with LOC.    He slept > a day after each one.   He was started on Keppra but stopped after a week or so due to side effects.  I personally reviewed the MRI of the brain dated 07/13/2018.  He has multiple T2/flair hyperintense foci.  Posterior fossa foci are noted in the posterior right medulla, bilateral cerebellar hemispheres, pons, right cerebellar peduncle and cerebral peduncle.  There are foci in the thalamus and multiple foci in the periventricular, juxtacortical and deep white matter.  None of the foci appear to be acute or enhance.  Labs were also reviewed.  His vitamin D was low at 20 but he states she has not taken any supplementation.  Hepatitis B labs showed no evidence of chronic infection.  I could not find a TB test.  REVIEW OF SYSTEMS: Constitutional: No fevers, chills, sweats, or change in appetite.  He has fatigue. Eyes: No visual changes, double vision, eye pain Ear,  nose and throat: No hearing loss, ear pain, nasal congestion, sore throat Cardiovascular: No chest pain, palpitations Respiratory: No shortness of breath at rest or with exertion.   No wheezes GastrointestinaI: No nausea, vomiting, diarrhea, abdominal pain, fecal incontinence Genitourinary: No dysuria, urinary retention or frequency.  No nocturia. Musculoskeletal: No neck pain, back pain Integumentary: No rash, pruritus, skin lesions Neurological: as above Psychiatric: No depression at this time.  No anxiety Endocrine: No palpitations, diaphoresis, change in appetite, change in weigh or increased thirst Hematologic/Lymphatic: No anemia, purpura, petechiae. Allergic/Immunologic: No itchy/runny eyes, nasal congestion, recent allergic reactions, rashes  ALLERGIES: No Known Allergies  HOME MEDICATIONS:  Current Outpatient Medications:  .  ocrelizumab (OCREVUS) 300 MG/10ML injection, Inject into the vein once. 600 mg every 6 months., Disp: , Rfl:  .  VITAMIN D PO, Take by mouth., Disp: , Rfl:   PAST MEDICAL HISTORY: Past Medical History:  Diagnosis Date  . MS (multiple sclerosis) (HCC)   . Seizure (HCC)     PAST SURGICAL HISTORY: Past Surgical History:  Procedure Laterality Date  . NO PAST SURGERIES      FAMILY HISTORY: Family History  Problem Relation Age of Onset  . Hypertension Mother   . Healthy Father   . Healthy Sister   . Healthy Brother   . Diabetes Maternal Grandmother   . Hypertension Maternal Grandfather   . Hodgkin's lymphoma Brother   . Colon cancer Maternal Uncle 45    SOCIAL HISTORY:  Social History   Socioeconomic History  . Marital status: Unknown    Spouse name: Not on file  . Number of children: Not on file  . Years of education: Not on file  . Highest education level: Not on file  Occupational History  . Not on file  Tobacco Use  . Smoking status: Current Some Day Smoker  . Smokeless tobacco: Never Used  Substance and Sexual Activity    . Alcohol use: Yes  . Drug use: Yes    Types: Marijuana  . Sexual activity: Not on file  Other Topics Concern  . Not on file  Social History Narrative  . Not on file   Social Determinants of Health   Financial Resource Strain:   . Difficulty of Paying Living Expenses: Not on file  Food Insecurity:   . Worried About Programme researcher, broadcasting/film/video in the Last Year: Not on file  . Ran Out of Food in the Last Year: Not on file  Transportation Needs:   .  Lack of Transportation (Medical): Not on file  . Lack of Transportation (Non-Medical): Not on file  Physical Activity:   . Days of Exercise per Week: Not on file  . Minutes of Exercise per Session: Not on file  Stress:   . Feeling of Stress : Not on file  Social Connections:   . Frequency of Communication with Friends and Family: Not on file  . Frequency of Social Gatherings with Friends and Family: Not on file  . Attends Religious Services: Not on file  . Active Member of Clubs or Organizations: Not on file  . Attends Archivist Meetings: Not on file  . Marital Status: Not on file  Intimate Partner Violence:   . Fear of Current or Ex-Partner: Not on file  . Emotionally Abused: Not on file  . Physically Abused: Not on file  . Sexually Abused: Not on file     PHYSICAL EXAM  Vitals:   01/22/19 1326  BP: 124/72  Pulse: 74  Temp: 97.9 F (36.6 C)  TempSrc: Temporal  Weight: 158 lb (71.7 kg)  Height: 5\' 8"  (1.727 m)    Body mass index is 24.02 kg/m.   General: The patient is well-developed and well-nourished and in no acute distress  HEENT:  Head is [place]/AT.  Sclera are anicteric.  Funduscopic exam shows normal optic discs and retinal vessels.  Neck: No carotid bruits are noted.  The neck is nontender.  Cardiovascular: The heart has a regular rate and rhythm with a normal S1 and S2. There were no murmurs, gallops or rubs.    Skin: Extremities are without rash or  edema.  Musculoskeletal:  Back is  nontender  Neurologic Exam  Mental status: The patient is alert and oriented x 3 at the time of the examination. The patient has apparent normal recent and remote memory, with an apparently normal attention span and concentration ability.   Speech is normal.  Cranial nerves: Extraocular movements show nystagmus on right gaze c/w INO. Pupils show a left APD.   Color vision is reduced OS.  .  Facial symmetry is present. There is good facial sensation to soft touch bilaterally.Facial strength is normal.  Trapezius and sternocleidomastoid strength is normal. No dysarthria is noted.  The tongue is midline, and the patient has symmetric elevation of the soft palate. No obvious hearing deficits are noted.  Motor:  Muscle bulk is normal.   Tone is increase on the left. Strength is  5 / 5 on the right and in the left arm though RAM is slowed on the left.   Strength is 4++ in the right leg and 4/5 in the left leg hip extension, 4+/5 quads and 4/5 distallyy.   Sensory: Sensory testing is intact to pinprick, soft touch and vibration sensation in all 4 extremities.  Coordination: Cerebellar testing shows normal finger-nose-finger on the right but mildly reduced on the left.  Heel-to-shin is performed normally on the right and poorly on the left.  Gait and station: Station is normal.   The gait is spastic.  There is a left foot drop.  He cannot do a tandem gait.  Romberg is negative.   Reflexes: Deep tendon reflexes are increased in the left arm and in both legs with spread left > right.   Plantar responses are flexor.    DIAGNOSTIC DATA (LABS, IMAGING, TESTING) - I reviewed patient records, labs, notes, testing and imaging myself where available.  Lab Results  Component Value Date   WBC  17.8 (H) 07/13/2018   HGB 14.1 07/13/2018   HCT 41.9 07/13/2018   MCV 95.4 07/13/2018   PLT 203 07/13/2018      Component Value Date/Time   NA 138 07/13/2018 0219   K 3.7 07/13/2018 0219   CL 108 07/13/2018 0219    CO2 21 (L) 07/13/2018 0219   GLUCOSE 112 (H) 07/13/2018 0219   BUN 8 07/13/2018 0219   CREATININE 1.03 07/13/2018 0219   CALCIUM 8.2 (L) 07/13/2018 0219   GFRNONAA >60 07/13/2018 0219   GFRAA >60 07/13/2018 0219       ASSESSMENT AND PLAN  MS (multiple sclerosis) (HCC) - Plan: CD19 and CD20, Flow Cytometry, IgG, IgA, IgM, CBC with Differential/Platelets, CMP, TSH, QuantiFERON-TB Gold Plus  Seizures (HCC)  Gait disturbance  Left-sided weakness  Other fatigue - Plan: CMP, TSH  High risk medication use - Plan: CD19 and CD20, Flow Cytometry, IgG, IgA, IgM, CBC with Differential/Platelets, CMP, TSH, QuantiFERON-TB Gold Plus  Vitamin D deficiency - Plan: VITAMIN D 25 Hydroxy (Vit-D Deficiency, Fractures)   In summary, Miguel Black is a 28 year old man who was diagnosed with relapsing remitting MS in 2015.  He appears to have done well on both Tysabri and now Ocrevus but had breakthrough activity on Tecfidera.  He would like to continue Ocrevus.  We will try to get his next infusion in mid January which will be about 6 months from his previous infusion.  I will check some blood work including IgG/IgM, CD19/20, CBC, CMP, TSH, vitamin D and QuantiFERON-TB (I could not find in his record, hepatitis B labs were negative).  He has had 2 seizures.  Although he may have been dehydrated with the second 1, I am still concerned that he is at a higher risk of having additional seizures.  Therefore, I would like to start an antiepileptic agent.  He had been on Keppra in the past and could not tolerate it.  I will have him try zonisamide and titrate to 200 mg nightly.  He will return for his infusion hopefully at the end of January.  He should call sooner if there are new or worsening neurologic symptoms.  I will see her for regular visit in about 6 months or sooner if new symptoms.  Besides starting zonisamide, he is advised to try to get at least 6-1/2 hours of sleep nightly.  Thank you for asking  me to see Miguel Black.  Please let me know if I can be of further assistance with him or other patients in the future.  Siarah Deleo A. Epimenio FootSater, MD, [facility],FAAN 01/22/2019, 1:34 PM Certified in Neurology, Clinical Neurophysiology, Sleep Medicine and Neuroimaging  [facility] [address], [address] [place], [place] [address] [phone]

## 2019-01-23 ENCOUNTER — Telehealth: Payer: Self-pay | Admitting: *Deleted

## 2019-01-23 NOTE — Telephone Encounter (Signed)
The Ocrevus form has been completed. It has been signed by both Dr. Felecia Shelling and the patient. This is a continuation of treatment. I spoke to the patient on the phone today.  He is getting a new Medicare plan with [employer] that will be starting in January 2021.  He is going to bring his new insurance cards to our office next Monday.  Once we have his updated information, his Ocrevus form will be provided to [facility].

## 2019-01-25 LAB — CBC WITH DIFFERENTIAL/PLATELET
Basophils Absolute: 0 10*3/uL (ref 0.0–0.2)
Basos: 1 %
EOS (ABSOLUTE): 0.1 10*3/uL (ref 0.0–0.4)
Eos: 1 %
Hematocrit: 46.3 % (ref 37.5–51.0)
Hemoglobin: 15.9 g/dL (ref 13.0–17.7)
Immature Grans (Abs): 0 10*3/uL (ref 0.0–0.1)
Immature Granulocytes: 0 %
Lymphocytes Absolute: 1.7 10*3/uL (ref 0.7–3.1)
Lymphs: 29 %
MCH: 32.6 pg (ref 26.6–33.0)
MCHC: 34.3 g/dL (ref 31.5–35.7)
MCV: 95 fL (ref 79–97)
Monocytes Absolute: 0.5 10*3/uL (ref 0.1–0.9)
Monocytes: 8 %
Neutrophils Absolute: 3.6 10*3/uL (ref 1.4–7.0)
Neutrophils: 61 %
Platelets: 225 10*3/uL (ref 150–450)
RBC: 4.88 x10E6/uL (ref 4.14–5.80)
RDW: 12.3 % (ref 11.6–15.4)
WBC: 5.8 10*3/uL (ref 3.4–10.8)

## 2019-01-25 LAB — COMPREHENSIVE METABOLIC PANEL
ALT: 10 IU/L (ref 0–44)
AST: 16 IU/L (ref 0–40)
Albumin/Globulin Ratio: 2.2 (ref 1.2–2.2)
Albumin: 5 g/dL (ref 4.1–5.2)
Alkaline Phosphatase: 64 IU/L (ref 39–117)
BUN/Creatinine Ratio: 11 (ref 9–20)
BUN: 11 mg/dL (ref 6–20)
Bilirubin Total: 0.3 mg/dL (ref 0.0–1.2)
CO2: 24 mmol/L (ref 20–29)
Calcium: 9.4 mg/dL (ref 8.7–10.2)
Chloride: 102 mmol/L (ref 96–106)
Creatinine, Ser: 0.98 mg/dL (ref 0.76–1.27)
GFR calc Af Amer: 122 mL/min/{1.73_m2} (ref >59)
GFR calc non Af Amer: 105 mL/min/{1.73_m2} (ref >59)
Globulin, Total: 2.3 g/dL (ref 1.5–4.5)
Glucose: 81 mg/dL (ref 65–99)
Potassium: 4.3 mmol/L (ref 3.5–5.2)
Sodium: 141 mmol/L (ref 134–144)
Total Protein: 7.3 g/dL (ref 6.0–8.5)

## 2019-01-25 LAB — QUANTIFERON-TB GOLD PLUS
QuantiFERON Mitogen Value: >10 IU/mL
QuantiFERON Nil Value: 0.02 IU/mL
QuantiFERON TB1 Ag Value: 0.02 IU/mL
QuantiFERON TB2 Ag Value: 0.02 IU/mL
QuantiFERON-TB Gold Plus: NEGATIVE

## 2019-01-25 LAB — IGG, IGA, IGM
IgA/Immunoglobulin A, Serum: 126 mg/dL (ref 90–386)
IgG (Immunoglobin G), Serum: 1270 mg/dL (ref 603–1613)
IgM (Immunoglobulin M), Srm: 21 mg/dL (ref 20–172)

## 2019-01-25 LAB — VITAMIN D 25 HYDROXY (VIT D DEFICIENCY, FRACTURES): Vit D, 25-Hydroxy: 30.1 ng/mL (ref 30.0–100.0)

## 2019-01-25 LAB — CD19 AND CD20, FLOW CYTOMETRY

## 2019-01-25 LAB — TSH: TSH: 0.625 u[IU]/mL (ref 0.450–4.500)

## 2019-01-28 ENCOUNTER — Telehealth: Payer: Self-pay | Admitting: *Deleted

## 2019-01-28 DIAGNOSIS — R7989 Other specified abnormal findings of blood chemistry: Secondary | ICD-10-CM

## 2019-01-28 MED ORDER — VITAMIN D (ERGOCALCIFEROL) 1.25 MG (50000 UNIT) PO CAPS: ORAL_CAPSULE | ORAL | 1 refills | Status: DC

## 2019-01-28 NOTE — Telephone Encounter (Signed)
Called and spoke with pt. Relayed results per MD note. Pt already taking VIT D OTC 5000U daily. He double the dose recently d/t feeling like Vit D was low. He would prefer rx Vit D 50,000U weekly. I escribed th pharmacy on file. He is still planning on bringing new insurance cards today to include with Ocrevus start form. He will call back if he cannot bring today.

## 2019-01-28 NOTE — Telephone Encounter (Signed)
-----   Message from Asa Lente, MD sent at 01/25/2019  7:58 PM EST ----- Labs look good although vitamin D was low normal   should take 5000 U over the counter (if he prefers we can send in a 29244 weekly script x 26 weeks   13 #1)

## 2019-01-29 NOTE — Telephone Encounter (Addendum)
Called pt since he did not drop off his insurance cards yesterday. He states he was not able to make it and was planning on mailing a copy. I recommended he try to drop off a copy or fax to [place] at [phone] so we can get the process started quicker for getting him on Ocrevus. He will try to fax and if he cannot, he will call back to let me know what he will do. I provided office phone number as well.

## 2019-02-06 NOTE — Telephone Encounter (Signed)
Faxed completed/signed start form to [employer] at [phone]. Received fax confirmation. Gave to [facility] for them to start processing and sent copy to [employer] to be scanned.

## 2019-04-01 ENCOUNTER — Telehealth: Payer: Self-pay | Admitting: Neurology

## 2019-04-01 NOTE — Telephone Encounter (Signed)
Called [facility] back and spoke with [facility]. Relayed Dr. Bonnita Hollow message. He verbalized understanding. They also received a call from specialty pharmacy coordinator letting them know pt hep b negative as well. Med has been shipped. Nothing further needed.

## 2019-04-01 NOTE — Telephone Encounter (Signed)
Phone rep checked office voicemail's @9 :59 this morning [facility] called re:  Occrevus for pt, they need to know if pt has tested negative for Hepatitis B.  Please call [phone]

## 2019-04-01 NOTE — Telephone Encounter (Signed)
She had Hepatitis labs (sendout) at [facility] 07/2017.   If they require more recent labs, we will need to order.  (HepB surface antigen, surface antibody, core antibody)

## 2019-07-14 ENCOUNTER — Other Ambulatory Visit: Payer: Self-pay | Admitting: Neurology

## 2019-07-14 DIAGNOSIS — R7989 Other specified abnormal findings of blood chemistry: Secondary | ICD-10-CM

## 2019-07-22 ENCOUNTER — Ambulatory Visit (INDEPENDENT_AMBULATORY_CARE_PROVIDER_SITE_OTHER): Payer: Medicare (Managed Care) | Admitting: Family Medicine

## 2019-07-22 ENCOUNTER — Encounter: Payer: Self-pay | Admitting: Family Medicine

## 2019-07-22 ENCOUNTER — Other Ambulatory Visit: Payer: Self-pay

## 2019-07-22 VITALS — BP 121/64 | HR 70 | Ht 68.0 in | Wt 164.0 lb

## 2019-07-22 DIAGNOSIS — G35 Multiple sclerosis: Secondary | ICD-10-CM

## 2019-07-22 DIAGNOSIS — E559 Vitamin D deficiency, unspecified: Secondary | ICD-10-CM | POA: Diagnosis not present

## 2019-07-22 DIAGNOSIS — G40909 Epilepsy, unspecified, not intractable, without status epilepticus: Secondary | ICD-10-CM

## 2019-07-22 NOTE — Patient Instructions (Addendum)
We will continue Ocrevus infusions (next infusion 09/2019). Continue zonisamide 200mg  daily. Continue vitamin D. We will update labs.   Follow up in 6 months    Multiple Sclerosis Multiple sclerosis (MS) is a disease of the brain, spinal cord, and optic nerves (central nervous system). It causes the body's disease-fighting (immune) system to destroy the protective covering (myelin sheath) around nerves in the brain. When this happens, signals (nerve impulses) going to and from the brain and spinal cord do not get sent properly or may not get sent at all. There are several types of MS:  Relapsing-remitting MS. This is the most common type. This causes sudden attacks of symptoms. After an attack, you may recover completely until the next attack, or some symptoms may remain permanently.  Secondary progressive MS. This usually develops after the onset of relapsing-remitting MS. Similar to relapsing-remitting MS, this type also causes sudden attacks of symptoms. Attacks may be less frequent, but symptoms slowly get worse (progress) over time.  Primary progressive MS. This causes symptoms that steadily progress over time. This type of MS does not cause sudden attacks of symptoms. The age of onset of MS varies, but it often develops between 64-48 years of age. MS is a lifelong (chronic) condition. There is no cure, but treatment can help slow down the progression of the disease. What are the causes? The cause of this condition is not known. What increases the risk? You are more likely to develop this condition if:  You are a woman.  You have a relative with MS. However, the condition is not passed from parent to child (inherited).  You have a lack (deficiency) of vitamin D.  You smoke. MS is more common in the 41 than in the [place]. What are the signs or symptoms? Relapsing-remitting and secondary progressive MS cause symptoms to occur in episodes or attacks  that may last weeks to months. There may be long periods between attacks in which there are almost no symptoms. Primary progressive MS causes symptoms to steadily progress after they develop. Symptoms of MS vary because of the many different ways it affects the central nervous system. The main symptoms include:  Vision problems and eye pain.  Numbness.  Weakness.  Inability to move your arms, hands, feet, or legs (paralysis).  Balance problems.  Shaking that you cannot control (tremors).  Muscle spasms.  Problems with thinking (cognitive changes). MS can also cause symptoms that are associated with the disease, but are not always the direct result of an MS attack. They may include:  Inability to control urination or bowel movements (incontinence).  Headaches.  Fatigue.  Inability to tolerate heat.  Emotional changes.  Depression.  Pain. How is this diagnosed? This condition is diagnosed based on:  Your symptoms.  A neurological exam. This involves checking central nervous system function, such as nerve function, reflexes, and coordination.  MRIs of the brain and spinal cord.  Lab tests, including a lumbar puncture that tests the fluid that surrounds the brain and spinal cord (cerebrospinal fluid).  Tests to measure the electrical activity of the brain in response to stimulation (evoked potentials). How is this treated? There is no cure for MS, but medicines can help decrease the number and frequency of attacks and help relieve nuisance symptoms. Treatment options may include:  Medicines that reduce the frequency of attacks. These medicines may be given by injection, by mouth (orally), or through an IV.  Medicines that reduce inflammation (steroids). These  may provide short-term relief of symptoms.  Medicines to help control pain, depression, fatigue, or incontinence.  Vitamin D, if you have a deficiency.  Using devices to help you move around (assistive  devices), such as braces, a cane, or a walker.  Physical therapy to strengthen and stretch your muscles.  Occupational therapy to help you with everyday tasks.  Alternative or complementary treatments such as exercise, massage, or acupuncture. Follow these instructions at home:  Take over-the-counter and prescription medicines only as told by your health care provider.  Do not drive or use heavy machinery while taking prescription pain medicine.  Use assistive devices as recommended by your physical therapist or your health care provider.  Exercise as directed by your health care provider.  Return to your normal activities as told by your health care provider. Ask your health care provider what activities are safe for you.  Reach out for support. Share your feelings with friends, family, or a support group.  Keep all follow-up visits as told by your health care provider and therapists. This is important. Where to find more information  National Multiple Sclerosis Society: https://www.nationalmssociety.org Contact a health care provider if:  You feel depressed.  You develop new pain or numbness.  You have tremors.  You have problems with sexual function. Get help right away if:  You develop paralysis.  You develop numbness.  You have problems with your bladder or bowel function.  You develop double vision.  You lose vision in one or both eyes.  You develop suicidal thoughts.  You develop severe confusion. If you ever feel like you may hurt yourself or others, or have thoughts about taking your own life, get help right away. You can go to your nearest emergency department or call:  Your local emergency services (911 in the [place]  A suicide crisis helpline, such as the National Suicide Prevention Lifeline at 608 227 1435. This is open 24 hours a day. Summary  Multiple sclerosis (MS) is a disease of the central nervous system that causes the body's immune system  to destroy the protective covering (myelin sheath) around nerves in the brain.  There are 3 types of MS: relapsing-remitting, secondary progressive, and primary progressive. Relapsing-remitting and secondary progressive MS cause symptoms to occur in episodes or attacks that may last weeks to months. Primary progressive MS causes symptoms to steadily progress after they develop.  There is no cure for MS, but medicines can help decrease the number and frequency of attacks and help relieve nuisance symptoms. Treatment may also include physical or occupational therapy.  If you develop numbness, paralysis, vision problems, or other neurological symptoms, get help right away. This information is not intended to replace advice given to you by your health care provider. Make sure you discuss any questions you have with your health care provider. Document Revised: 12/23/2016 Document Reviewed: 03/21/2016 Elsevier Patient Education  2020 [employer].

## 2019-07-22 NOTE — Progress Notes (Signed)
PATIENT: Miguel Black DOB: Feb 16, 1990  REASON FOR VISIT: follow up HISTORY FROM: patient  Chief Complaint  Patient presents with  . Follow-up    MS, SZ FU, rm 4, alone, pt states he is stable      HISTORY OF PRESENT ILLNESS: Today 07/22/19 Miguel Black is a 29 y.o. male here today for follow up. Last infusion was in March, next scheduled for September. He is feeling well today. No gait changes. He uses a cane for stabilization when walking longer distances. No falls. No new numbness or weakness. No vision, bowel or bladder changes. He does have residual blurry vision of left eye, present since diagnosis, but no new changes. No recent seizures. He continues zonisamide 200mg  at bedtime. He feels mood is good. He does not work. He lives with his parents. He has a good support system.   HISTORY: (copied from Dr note on 01/22/2019)  I had the pleasure of seeing your patient, Miguel Black, at the MS center at [facility] for neurologic consultation regarding his multiple sclerosis.  He was diagnosed with relapsing remitting MS in June 2015 after presenting with reduced vision OS while he was in [facility], [facility].   In retrospect he had had some neurologic symptoms a few years before the disgnosis.   He had trouble running before his diagnosis.    He was placed on Tecfidera but he had breakthrough activity.     Just a year later, he was switched to Tysabri and has done better.   It appears as though some of his symptoms came on more suddenly and other symptoms progressed over time.  He moved to [facility] last June and started seeing the MS Center at [facility] ([facility]).   He has not had any exacerbations and feels his MS has been stable.   Initially, he continue Tysabri.  He tolerated Tysabri well but going back and forth to an infusion center monthly with difficulty.    He has been JCV Ab negative.   His last infusion was off Tysabri was in summer  2019 and he switched to Ocrevus at the end of 2019.  His last infusion was August 07, 2018.    He is scheduled to have another one at [facility] January 12 but he prefers to have infusions closer to home.    His last JCV Ab test I have a result for is 07/04/2017 (negative 0.14).   Currently, he is walking with a cane outdoors but walks without it indoors.   He started using the cane in 2016 but felt he should have started using it in 2015.     His left leg and arm are weaker than the right.    He also has spasticity on the left.    His left arm is slowed and mildly weak.   Bladder function is fine.       He notes vision is blurry on the left and he has color desaturation out of the left eye.   His right eye has good vision.      He notes fatigue that is minimal in the morning but much worse later in the day.   Fatigue is physical > cognitive.   He sleeps well.    He feels focus and attention are good though cognitive skills sometimes are a little off.   He feels mood is fine.     He has had two seizures.    His  first one was 2018 and second one was 07/12/2018.   He notes being dehydrated the second one.    Both seizures were GTC by description with LOC.    He slept > a day after each one.   He was started on Keppra but stopped after a week or so due to side effects.  I personally reviewed the MRI of the brain dated 07/13/2018.  He has multiple T2/flair hyperintense foci.  Posterior fossa foci are noted in the posterior right medulla, bilateral cerebellar hemispheres, pons, right cerebellar peduncle and cerebral peduncle.  There are foci in the thalamus and multiple foci in the periventricular, juxtacortical and deep white matter.  None of the foci appear to be acute or enhance.  Labs were also reviewed.  His vitamin D was low at 20 but he states she has not taken any supplementation.  Hepatitis B labs showed no evidence of chronic infection.  I could not find a TB test.    REVIEW OF SYSTEMS: Out of a complete  14 system review of symptoms, the patient complains only of the following symptoms, blurry vision, seizure and all other reviewed systems are negative.  ALLERGIES: No Known Allergies  HOME MEDICATIONS: Outpatient Medications Prior to Visit  Medication Sig Dispense Refill  . ocrelizumab (OCREVUS) 300 MG/10ML injection Inject into the vein once. 600 mg every 6 months.    . Vitamin D, Ergocalciferol, (DRISDOL) 1.25 MG (50000 UT) CAPS capsule Take 1 capsule by mouth weekly for 26 weeks. 13 capsule 1  . zonisamide (ZONEGRAN) 100 MG capsule Take 2 at night 60 capsule 11   No facility-administered medications prior to visit.    PAST MEDICAL HISTORY: Past Medical History:  Diagnosis Date  . MS (multiple sclerosis) (HCC)   . Seizure (HCC)     PAST SURGICAL HISTORY: Past Surgical History:  Procedure Laterality Date  . NO PAST SURGERIES      FAMILY HISTORY: Family History  Problem Relation Age of Onset  . Hypertension Mother   . Healthy Father   . Healthy Sister   . Healthy Brother   . Diabetes Maternal Grandmother   . Hypertension Maternal Grandfather   . Hodgkin's lymphoma Brother   . Colon cancer Maternal Uncle 45    SOCIAL HISTORY: Social History   Socioeconomic History  . Marital status: Unknown    Spouse name: Not on file  . Number of children: Not on file  . Years of education: Not on file  . Highest education level: Not on file  Occupational History  . Not on file  Tobacco Use  . Smoking status: Current Some Day Smoker  . Smokeless tobacco: Never Used  Vaping Use  . Vaping Use: Never used  Substance and Sexual Activity  . Alcohol use: Yes  . Drug use: Yes    Types: Marijuana  . Sexual activity: Not on file  Other Topics Concern  . Not on file  Social History Narrative  . Not on file   Social Determinants of Health   Financial Resource Strain:   . Difficulty of Paying Living Expenses:   Food Insecurity:   . Worried About Programme researcher, broadcasting/film/video in the  Last Year:   . Barista in the Last Year:   Transportation Needs:   . Freight forwarder (Medical):   Marland Kitchen Lack of Transportation (Non-Medical):   Physical Activity:   . Days of Exercise per Week:   . Minutes of Exercise per Session:  Stress:   . Feeling of Stress :   Social Connections:   . Frequency of Communication with Friends and Family:   . Frequency of Social Gatherings with Friends and Family:   . Attends Religious Services:   . Active Member of Clubs or Organizations:   . Attends Banker Meetings:   Marland Kitchen Marital Status:   Intimate Partner Violence:   . Fear of Current or Ex-Partner:   . Emotionally Abused:   Marland Kitchen Physically Abused:   . Sexually Abused:       PHYSICAL EXAM  Vitals:   07/22/19 1436  BP: 121/64  Pulse: 70  Weight: 164 lb (74.4 kg)  Height: 5\' 8"  (1.727 m)   Body mass index is 24.94 kg/m.  Generalized: Well developed, in no acute distress  Cardiology: normal rate and rhythm, no murmur noted Respiratory: clear to auscultation bilaterally  Neurological examination  Mentation: Alert oriented to time, place, history taking. Follows all commands speech and language fluent Cranial nerve II-XII: Pupils were equal round reactive to light. Extraocular movements were full, visual field were full on confrontational test. Facial sensation and strength were normal. Uvula tongue midline. Head turning and shoulder shrug  were normal and symmetric. Motor: The motor testing reveals 5 over 5 strength of bilateral upper extremities, 4+/5 right lower extremity, 4/5 left lower.  Increased tone on left   Sensory: Sensory testing is intact to soft touch on all 4 extremities. No evidence of extinction is noted.  Coordination: Cerebellar testing reveals good finger-nose-finger and heel-to-shin of right upper and lower, reduced on left  Gait and station: spastic gait, left foot drop, stable with cane. Tandem not attempted.   DIAGNOSTIC DATA (LABS, IMAGING,  TESTING) - I reviewed patient records, labs, notes, testing and imaging myself where available.  No flowsheet data found.   Lab Results  Component Value Date   WBC 5.8 01/22/2019   HGB 15.9 01/22/2019   HCT 46.3 01/22/2019   MCV 95 01/22/2019   PLT 225 01/22/2019      Component Value Date/Time   NA 141 01/22/2019 1421   K 4.3 01/22/2019 1421   CL 102 01/22/2019 1421   CO2 24 01/22/2019 1421   GLUCOSE 81 01/22/2019 1421   GLUCOSE 112 (H) 07/13/2018 0219   BUN 11 01/22/2019 1421   CREATININE 0.98 01/22/2019 1421   CALCIUM 9.4 01/22/2019 1421   PROT 7.3 01/22/2019 1421   ALBUMIN 5.0 01/22/2019 1421   AST 16 01/22/2019 1421   ALT 10 01/22/2019 1421   ALKPHOS 64 01/22/2019 1421   BILITOT 0.3 01/22/2019 1421   GFRNONAA 105 01/22/2019 1421   GFRAA 122 01/22/2019 1421   No results found for: CHOL, HDL, LDLCALC, LDLDIRECT, TRIG, CHOLHDL No results found for: 01/24/2019 No results found for: VITAMINB12 Lab Results  Component Value Date   TSH 0.625 01/22/2019       ASSESSMENT AND PLAN 29 y.o. year old male  has a past medical history of MS (multiple sclerosis) (HCC) and Seizure (HCC). here with     ICD-10-CM   1. Relapsing remitting multiple sclerosis (HCC)  G35 CMP    CBC with Differential/Platelets    Vitamin D, 25-hydroxy  2. Vitamin D deficiency  E55.9 Vitamin D, 25-hydroxy  3. Seizure disorder (HCC)  G40.909 CMP    Abdallah is doing very well today. He continues Ocrevus infusions and is tolerating well. We will continue zonisamide 200mg  daily. No recent seizure activity noted. We will update labs today. He  was encouraged to continue healthy lifestyle habits with regular activity and well balanced diet. He will return for next infusion in 09/2019 and return to see Dr Epimenio Foot in 6 months. He verbalizes understanding and agreement with this plan.    Orders Placed This Encounter  Procedures  . CMP  . CBC with Differential/Platelets  . Vitamin D, 25-hydroxy     No orders  of the defined types were placed in this encounter.     I spent 15 minutes with the patient. 50% of this time was spent counseling and educating patient on plan of care and medications.    Shawnie Dapper, FNP-C 07/22/2019, 3:19 PM [facility] [address], [address] [place], [place] [address] [phone]

## 2019-07-22 NOTE — Progress Notes (Signed)
I have read the note, and I agree with the clinical assessment and plan.  Marcel Sorter A. Adriell Polansky, MD, PhD, FAAN Certified in Neurology, Clinical Neurophysiology, Sleep Medicine, Pain Medicine and Neuroimaging  [facility] [address], [address] [place], [place] [address] [phone]  

## 2019-07-23 ENCOUNTER — Telehealth: Payer: Self-pay

## 2019-07-23 LAB — CBC WITH DIFFERENTIAL/PLATELET
Basophils Absolute: 0 10*3/uL (ref 0.0–0.2)
Basos: 1 %
EOS (ABSOLUTE): 0.1 10*3/uL (ref 0.0–0.4)
Eos: 1 %
Hematocrit: 46.5 % (ref 37.5–51.0)
Hemoglobin: 15.8 g/dL (ref 13.0–17.7)
Immature Grans (Abs): 0 10*3/uL (ref 0.0–0.1)
Immature Granulocytes: 0 %
Lymphocytes Absolute: 1.6 10*3/uL (ref 0.7–3.1)
Lymphs: 25 %
MCH: 33.1 pg — ABNORMAL HIGH (ref 26.6–33.0)
MCHC: 34 g/dL (ref 31.5–35.7)
MCV: 97 fL (ref 79–97)
Monocytes Absolute: 0.5 10*3/uL (ref 0.1–0.9)
Monocytes: 8 %
Neutrophils Absolute: 4.1 10*3/uL (ref 1.4–7.0)
Neutrophils: 65 %
Platelets: 265 10*3/uL (ref 150–450)
RBC: 4.78 x10E6/uL (ref 4.14–5.80)
RDW: 12.8 % (ref 11.6–15.4)
WBC: 6.3 10*3/uL (ref 3.4–10.8)

## 2019-07-23 LAB — COMPREHENSIVE METABOLIC PANEL
ALT: 12 IU/L (ref 0–44)
AST: 19 IU/L (ref 0–40)
Albumin/Globulin Ratio: 2.1 (ref 1.2–2.2)
Albumin: 4.9 g/dL (ref 4.1–5.2)
Alkaline Phosphatase: 85 IU/L (ref 48–121)
BUN/Creatinine Ratio: 10 (ref 9–20)
BUN: 12 mg/dL (ref 6–20)
Bilirubin Total: <0.2 mg/dL (ref 0.0–1.2)
CO2: 22 mmol/L (ref 20–29)
Calcium: 9.7 mg/dL (ref 8.7–10.2)
Chloride: 103 mmol/L (ref 96–106)
Creatinine, Ser: 1.16 mg/dL (ref 0.76–1.27)
GFR calc Af Amer: 99 mL/min/{1.73_m2} (ref >59)
GFR calc non Af Amer: 85 mL/min/{1.73_m2} (ref >59)
Globulin, Total: 2.3 g/dL (ref 1.5–4.5)
Glucose: 87 mg/dL (ref 65–99)
Potassium: 4.2 mmol/L (ref 3.5–5.2)
Sodium: 140 mmol/L (ref 134–144)
Total Protein: 7.2 g/dL (ref 6.0–8.5)

## 2019-07-23 LAB — VITAMIN D 25 HYDROXY (VIT D DEFICIENCY, FRACTURES): Vit D, 25-Hydroxy: 56.6 ng/mL (ref 30.0–100.0)

## 2019-07-23 NOTE — Telephone Encounter (Signed)
Pt verified by name and DOB, results given per provider, pt voiced understanding all question answered. 

## 2019-07-23 NOTE — Telephone Encounter (Signed)
-----   Message from Shawnie Dapper, NP sent at 07/23/2019  8:18 AM EDT ----- Labs look good! We will see him back in 6 months

## 2019-09-16 ENCOUNTER — Other Ambulatory Visit: Payer: Self-pay | Admitting: *Deleted

## 2019-09-16 DIAGNOSIS — G35 Multiple sclerosis: Secondary | ICD-10-CM

## 2019-09-16 MED ORDER — OCREVUS 300 MG/10ML IV SOLN: 600.0000 mg | INTRAVENOUS | 1 refills | Status: DC

## 2019-12-03 ENCOUNTER — Other Ambulatory Visit: Payer: Self-pay

## 2019-12-03 ENCOUNTER — Inpatient Hospital Stay (HOSPITAL_COMMUNITY)
Admission: EM | Admit: 2019-12-03 | Discharge: 2019-12-05 | DRG: 101 | Disposition: A | Discharge status: Home / Self Care | Payer: Medicare (Managed Care) | Attending: Family Medicine | Admitting: Family Medicine

## 2019-12-03 ENCOUNTER — Emergency Department (HOSPITAL_COMMUNITY): Payer: Medicare (Managed Care)

## 2019-12-03 DIAGNOSIS — F1290 Cannabis use, unspecified, uncomplicated: Secondary | ICD-10-CM

## 2019-12-03 DIAGNOSIS — G35A Relapsing-remitting multiple sclerosis: Secondary | ICD-10-CM | POA: Diagnosis present

## 2019-12-03 DIAGNOSIS — Z87891 Personal history of nicotine dependence: Secondary | ICD-10-CM

## 2019-12-03 DIAGNOSIS — Z8249 Family history of ischemic heart disease and other diseases of the circulatory system: Secondary | ICD-10-CM

## 2019-12-03 DIAGNOSIS — Z20822 Contact with and (suspected) exposure to covid-19: Secondary | ICD-10-CM | POA: Diagnosis present

## 2019-12-03 DIAGNOSIS — G9340 Encephalopathy, unspecified: Secondary | ICD-10-CM | POA: Diagnosis not present

## 2019-12-03 DIAGNOSIS — Z9119 Patient's noncompliance with other medical treatment and regimen: Secondary | ICD-10-CM

## 2019-12-03 DIAGNOSIS — G40909 Epilepsy, unspecified, not intractable, without status epilepticus: Secondary | ICD-10-CM | POA: Diagnosis not present

## 2019-12-03 DIAGNOSIS — Z79899 Other long term (current) drug therapy: Secondary | ICD-10-CM

## 2019-12-03 DIAGNOSIS — R569 Unspecified convulsions: Secondary | ICD-10-CM

## 2019-12-03 DIAGNOSIS — R4182 Altered mental status, unspecified: Secondary | ICD-10-CM

## 2019-12-03 DIAGNOSIS — G35 Multiple sclerosis: Secondary | ICD-10-CM | POA: Diagnosis present

## 2019-12-03 DIAGNOSIS — Z833 Family history of diabetes mellitus: Secondary | ICD-10-CM

## 2019-12-03 DIAGNOSIS — D72829 Elevated white blood cell count, unspecified: Secondary | ICD-10-CM | POA: Diagnosis present

## 2019-12-03 LAB — COMPREHENSIVE METABOLIC PANEL
ALT: 17 U/L (ref 0–44)
AST: 30 U/L (ref 15–41)
Albumin: 4.4 g/dL (ref 3.5–5.0)
Alkaline Phosphatase: 64 U/L (ref 38–126)
Anion gap: 18 — ABNORMAL HIGH (ref 5–15)
BUN: 10 mg/dL (ref 6–20)
CO2: 19 mmol/L — ABNORMAL LOW (ref 22–32)
Calcium: 9 mg/dL (ref 8.9–10.3)
Chloride: 100 mmol/L (ref 98–111)
Creatinine, Ser: 1.05 mg/dL (ref 0.61–1.24)
GFR, Estimated: >60 mL/min (ref >60)
Glucose, Bld: 87 mg/dL (ref 70–99)
Potassium: 4.4 mmol/L (ref 3.5–5.1)
Sodium: 137 mmol/L (ref 135–145)
Total Bilirubin: 0.6 mg/dL (ref 0.3–1.2)
Total Protein: 7.3 g/dL (ref 6.5–8.1)

## 2019-12-03 LAB — LACTIC ACID, PLASMA: Lactic Acid, Venous: 2 mmol/L (ref 0.5–1.9)

## 2019-12-03 LAB — CBC WITH DIFFERENTIAL/PLATELET
Abs Immature Granulocytes: 0.09 10*3/uL — ABNORMAL HIGH (ref 0.00–0.07)
Basophils Absolute: 0 10*3/uL (ref 0.0–0.1)
Basophils Relative: 0 %
Eosinophils Absolute: 0 10*3/uL (ref 0.0–0.5)
Eosinophils Relative: 0 %
HCT: 44.6 % (ref 39.0–52.0)
Hemoglobin: 14.7 g/dL (ref 13.0–17.0)
Immature Granulocytes: 1 %
Lymphocytes Relative: 6 %
Lymphs Abs: 0.9 10*3/uL (ref 0.7–4.0)
MCH: 31.9 pg (ref 26.0–34.0)
MCHC: 33 g/dL (ref 30.0–36.0)
MCV: 96.7 fL (ref 80.0–100.0)
Monocytes Absolute: 0.6 10*3/uL (ref 0.1–1.0)
Monocytes Relative: 4 %
Neutro Abs: 13.3 10*3/uL — ABNORMAL HIGH (ref 1.7–7.7)
Neutrophils Relative %: 89 %
Platelets: 276 10*3/uL (ref 150–400)
RBC: 4.61 MIL/uL (ref 4.22–5.81)
RDW: 13.1 % (ref 11.5–15.5)
WBC: 14.9 10*3/uL — ABNORMAL HIGH (ref 4.0–10.5)
nRBC: 0 % (ref 0.0–0.2)

## 2019-12-03 LAB — ETHANOL: Alcohol, Ethyl (B): <10 mg/dL (ref <10)

## 2019-12-03 LAB — ACETAMINOPHEN LEVEL: Acetaminophen (Tylenol), Serum: <10 ug/mL — ABNORMAL LOW (ref 10–30)

## 2019-12-03 LAB — RESPIRATORY PANEL BY RT PCR (FLU A&B, COVID)
Influenza A by PCR: NEGATIVE
Influenza B by PCR: NEGATIVE
SARS Coronavirus 2 by RT PCR: NEGATIVE

## 2019-12-03 LAB — SALICYLATE LEVEL: Salicylate Lvl: <7 mg/dL — ABNORMAL LOW (ref 7.0–30.0)

## 2019-12-03 MED ORDER — SODIUM CHLORIDE 0.9 % IV BOLUS
1000.0000 mL | Freq: Once | INTRAVENOUS | Status: AC
  Administered 2019-12-03: 1000 mL via INTRAVENOUS

## 2019-12-03 NOTE — ED Notes (Signed)
Received report from [facility]. Pt both side rails up and with side rails. Not in respiratory distress.

## 2019-12-03 NOTE — ED Provider Notes (Signed)
[facility] EMERGENCY DEPARTMENT Provider Note   CSN: [id number] Arrival date & time: 12/03/19  1904     History Chief Complaint  Patient presents with  . Seizures    Miguel Black is a 29 y.o. male past M[place] seizures, MS who presents via EMS for evaluation of altered mental status.  History is provided by mom who I called.  She reports that patient was last seen normal at about 4:30 PM.  Dad went to go talk to him in his room and found him on the floor.  Patient was unconscious but breathing.  They called EMS.  She states that they never saw any seizure activity and had not been able to get him back to baseline.  She reports that he had seizures in [place] and had been on Keppra for short amount of time but is currently not on any medications.  She thinks his last seizure was in August 2020 when he was admitted for status.  She states he has been in his normal state of health recently and has not any fevers, chills, vomiting.  EM LEVEL 5 CAVEAT DUE TO AMS  The history is provided by the patient.       Past Medical History:  Diagnosis Date  . MS (multiple sclerosis) (HCC)   . Seizure [facility])     Patient Active Problem List   Diagnosis Date Noted  . Seizures (HCC) 01/22/2019  . Gait disturbance 01/22/2019  . Left-sided weakness 01/22/2019  . Other fatigue 01/22/2019  . Status epilepticus (HCC) 07/12/2018  . Aspiration pneumonia (HCC) 07/12/2018  . MS (multiple sclerosis) (HCC) 07/12/2018  . Encounter for screening for HIV 07/12/2018    Past Surgical History:  Procedure Laterality Date  . NO PAST SURGERIES         Family History  Problem Relation Age of Onset  . Hypertension Mother   . Healthy Father   . Healthy Sister   . Healthy Brother   . Diabetes Maternal Grandmother   . Hypertension Maternal Grandfather   . Hodgkin's lymphoma Brother   . Colon cancer Maternal Uncle 55    Social History   Tobacco Use  . Smoking status: Current  Some Day Smoker  . Smokeless tobacco: Never Used  Vaping Use  . Vaping Use: Never used  Substance Use Topics  . Alcohol use: Yes  . Drug use: Yes    Types: Marijuana    Home Medications Prior to Admission medications   Medication Sig Start Date End Date Taking? Authorizing Provider  ocrelizumab (OCREVUS) 300 MG/10ML injection Inject 20 mLs (600 mg total) into the vein every 6 (six) months. 600 mg every 6 months. 09/16/19   Lomax, Amy, NP  Vitamin D, Ergocalciferol, (DRISDOL) 1.25 MG (50000 UT) CAPS capsule Take 1 capsule by mouth weekly for 26 weeks. 01/28/19   Sater, Pearletha Furl, MD  zonisamide (ZONEGRAN) 100 MG capsule Take 2 at night 01/22/19   Sater, Pearletha Furl, MD    Allergies    Patient has no known allergies.  Review of Systems   Review of Systems  Unable to perform ROS: Mental status change    Physical Exam Updated Vital Signs BP 115/61   Pulse 88   Temp 97.8 F (36.6 C) (Axillary)   Resp 16   Ht 6' (1.829 m) Comment: pt unresponsive to verbal questions at this time  Wt 74.8 kg   SpO2 95%   BMI 22.38 kg/m   Physical Exam Vitals and  nursing note reviewed.  Constitutional:      Appearance: Normal appearance. He is well-developed.     Comments: Altered LOC  HENT:     Head: Normocephalic and atraumatic.     Comments: No tenderness to palpation of skull. No deformities or crepitus noted. No open wounds, abrasions or lacerations.  Eyes:     General: Lids are normal.     Conjunctiva/sclera: Conjunctivae normal.     Pupils: Pupils are equal, round, and reactive to light.     Comments: PERRL.  Cardiovascular:     Rate and Rhythm: Normal rate and regular rhythm.     Pulses: Normal pulses.     Heart sounds: Normal heart sounds. No murmur heard.  No friction rub. No gallop.   Pulmonary:     Effort: Pulmonary effort is normal.     Breath sounds: Normal breath sounds.  Abdominal:     Palpations: Abdomen is soft. Abdomen is not rigid.     Tenderness: There is no  abdominal tenderness. There is no guarding.     Comments: Abdomen is soft, non-distended, non-tender. No rigidity, No guarding. No peritoneal signs.  Musculoskeletal:        General: Normal range of motion.     Cervical back: Full passive range of motion without pain.  Skin:    General: Skin is warm and dry.     Capillary Refill: Capillary refill takes less than 2 seconds.  Neurological:     Mental Status: He is lethargic.     Comments: Response to painful stimuli.  He withdraws his legs and arms from painful stimuli.  Snoring respirations noted.  Psychiatric:        Speech: Speech normal.     ED Results / Procedures / Treatments   Labs (all labs ordered are listed, but only abnormal results are displayed) Labs Reviewed  COMPREHENSIVE METABOLIC PANEL - Abnormal; Notable for the following components:      Result Value   CO2 19 (*)    Anion gap 18 (*)    All other components within normal limits  CBC WITH DIFFERENTIAL/PLATELET - Abnormal; Notable for the following components:   WBC 14.9 (*)    Neutro Abs 13.3 (*)    Abs Immature Granulocytes 0.09 (*)    All other components within normal limits  LACTIC ACID, PLASMA - Abnormal; Notable for the following components:   Lactic Acid, Venous 2.0 (*)    All other components within normal limits  ACETAMINOPHEN LEVEL - Abnormal; Notable for the following components:   Acetaminophen (Tylenol), Serum <10 (*)    All other components within normal limits  SALICYLATE LEVEL - Abnormal; Notable for the following components:   Salicylate Lvl <7.0 (*)    All other components within normal limits  RESPIRATORY PANEL BY RT PCR (FLU A&B, COVID)  ETHANOL  LACTIC ACID, PLASMA  RAPID URINE DRUG SCREEN, HOSP PERFORMED  URINALYSIS, ROUTINE W REFLEX MICROSCOPIC    EKG EKG Interpretation  Date/Time:  Tuesday December 03 2019 19:38:07 EST Ventricular Rate:  85 PR Interval:    QRS Duration: 88 QT Interval:  374 QTC Calculation: 445 R  Axis:   73 Text Interpretation: Sinus rhythm Borderline short PR interval ST elev, probable normal early repol pattern No STEMI Confirmed by Alvester Chou [phone]) on 12/03/2019 7:44:27 PM   Radiology CT Head Wo Contrast  Result Date: 12/03/2019 CLINICAL DATA:  Delirium, multiple sclerosis EXAM: CT HEAD WITHOUT CONTRAST TECHNIQUE: Contiguous axial images were obtained from  the base of the skull through the vertex without intravenous contrast. COMPARISON:  07/12/2018 FINDINGS: Brain: No acute infarct or hemorrhage. Hypodensities in the periventricular white matter, most pronounced in the left parietal region, compatible with known history of multiple sclerosis. Lateral ventricles and remaining midline structures are unremarkable. No acute extra-axial fluid collections. No mass effect. Vascular: No hyperdense vessel or unexpected calcification. Skull: Normal. Negative for fracture or focal lesion. Sinuses/Orbits: Stable polypoid mucosal thickening or mucous retention cyst right maxillary sinus. Remaining sinuses are clear. Other: None. IMPRESSION: 1. Stable hypodensities in the periventricular white matter compatible with patient's known history of multiple sclerosis. 2. No acute infarct or hemorrhage. Electronically Signed   By: Sharlet Salina M.D.   On: 12/03/2019 20:19    Procedures Procedures (including critical care time)  Medications Ordered in ED Medications  sodium chloride 0.9 % bolus 1,000 mL (0 mLs Intravenous Stopped 12/03/19 2233)    ED Course  I have reviewed the triage vital signs and the nursing notes.  Pertinent labs & imaging results that were available during my care of the patient were reviewed by me and considered in my medical decision making (see chart for details).    MDM Rules/Calculators/A&P                          29 year old male who presents for evaluation of altered mental status.  EMS reports that patient was found by family.  On initially arrival, patient is  afebrile, vitals are stable though he is slightly tachycardic.  On exam, he is altered and is responsive to painful stimuli.  He has snoring respirations.  Concern for seizure versus intracranial process versus metabolic process.  Plan to check labs, head CT.  I discussed with mom.  Patient with history of seizures but has not had any since August 2020.  During that time, he was admitted for status.  He is not currently on any seizure medication.  Mom states he has been in his normal state of health prior to onset of symptoms today.  Unclear of exactly what time this happened.  She last saw him at 430.  Patient was in his room and dad went to go talk to him shortly prior to EMS call and found him unresponsive on the floor.  Mom states that [facility] for [facility] MS.  He is currently on Ocrevus and has not had any new changes in med.   Review of his records show that in the summer 2020, he had multiple seizures after another.  He was admitted for status.  At that time, his UDS was positive for benzos and marijuana.  Neurology felt like marijuana may have prompted this.  He was supposed to be on Keppra 500 mg twice a day after being discharged but mom states he is not on this medication.  Tylenol level, ethanol level, salicylate level unremarkable.  Lactic is 2.0.  CBC shows leukocytosis of 14.9.  CMP shows normal BUN and creatinine.  Anion gap is 18.  Covid negative.   Reevaluation.  Patient is slightly more alert than he was previously but is still sleepy.  Will continue plan to monitor.  Patient signed out to [facility], PA-C pending re-evaluation.   Portions of this note were generated with Scientist, clinical (histocompatibility and immunogenetics). Dictation errors may occur despite best attempts at proofreading.  Final Clinical Impression(s) / ED Diagnoses Final diagnoses:  Altered mental status, unspecified altered mental status type  Rx / DC Orders ED Discharge Orders    None       Rosana Hoes 12/03/19 2353    Terald Sleeper, MD 12/04/19 [phone]

## 2019-12-03 NOTE — ED Notes (Signed)
Unable to assess screening questions at this time as pt is only responsive to verbal commands.

## 2019-12-03 NOTE — ED Notes (Signed)
Back to room at this time.

## 2019-12-03 NOTE — ED Notes (Signed)
Provider made aware of pots status at this time.

## 2019-12-03 NOTE — ED Provider Notes (Signed)
23:45: Assumed care of patient from Graciella Freer, PA-C at change of shift pending reassessment and disposition.  Please see prior provider note for full H&P Briefly patient is a 29 year old male with a history of multiple sclerosis on ocrevus who presented to the emergency department for evaluation of altered mental status.  Last known normal was 4:30 PM.  Shortly prior to arrival patient's family member found him laying on the floor unconscious but breathing.  Patient has had prior seizures with admission for status epilepticus in August 2020, was on Keppra for a period of time but is no longer taking this. His AMS seemed similar to prior post ictal state.   On arrival patient found to have altered level of consciousness but was responding to painful stimuli with snoring respirations noted.  His work-up in the emergency department so far has been notable for a mild leukocytosis, minimally elevated lactic acid, mildly low bicarb with anion gap of 18.  CT head without acute process, no bleed.  Per prior team keppra load ordered. Plan for continual assessment to determine disposition.  On reevaluation of the patient by myself and with prior PA @ 00:15 patient's eyes are open now, he is awake, he is tracking our movement throughout the exam room, however he is not verbally responding to questions or following commands.  At this time will discuss with neurology and plan for admission for suspected prolonged postictal phase.  00:22: CONSULT: Discussed with neurologist Dr. Nicholes Stairs appears that patient had not received Keppra earlier, he has ordered a loading dose as well as patient's scheduled Keppra for admission, in agreement that he should be admitted for observation. Relays admitting team can re-consult neuro as needed. Appreciate consultation.  00:38: CONSULT: Discussed with hospitalist Dr. Toniann Fail- accepts admission.   Findings and plan of care discussed with supervising physician Dr.  Wilkie Aye who is in agreement.      Cherly Anderson, PA-C 12/04/19 0049    Shon Baton, MD 12/04/19 [phone]

## 2019-12-03 NOTE — ED Notes (Addendum)
Walked into this pts room and condom cath removed by pt. Pt appears more awake - now responsive to verbal stimuli but still not responding to this RN when assessing for orientation. Pt repositioned in bed with help of Jesus. Urinal placed at bedside.

## 2019-12-03 NOTE — ED Notes (Signed)
Pt off to CT with monitor. Not in respiratory distress.

## 2019-12-04 ENCOUNTER — Inpatient Hospital Stay (HOSPITAL_COMMUNITY): Payer: Medicare (Managed Care)

## 2019-12-04 ENCOUNTER — Encounter (HOSPITAL_COMMUNITY): Payer: Self-pay | Admitting: Internal Medicine

## 2019-12-04 ENCOUNTER — Other Ambulatory Visit: Payer: Self-pay

## 2019-12-04 DIAGNOSIS — F1290 Cannabis use, unspecified, uncomplicated: Secondary | ICD-10-CM | POA: Diagnosis present

## 2019-12-04 DIAGNOSIS — G35 Multiple sclerosis: Secondary | ICD-10-CM | POA: Diagnosis present

## 2019-12-04 DIAGNOSIS — G9340 Encephalopathy, unspecified: Secondary | ICD-10-CM

## 2019-12-04 DIAGNOSIS — R569 Unspecified convulsions: Secondary | ICD-10-CM | POA: Diagnosis not present

## 2019-12-04 DIAGNOSIS — Z833 Family history of diabetes mellitus: Secondary | ICD-10-CM | POA: Diagnosis not present

## 2019-12-04 DIAGNOSIS — G40909 Epilepsy, unspecified, not intractable, without status epilepticus: Secondary | ICD-10-CM | POA: Diagnosis present

## 2019-12-04 DIAGNOSIS — Z87891 Personal history of nicotine dependence: Secondary | ICD-10-CM | POA: Diagnosis not present

## 2019-12-04 DIAGNOSIS — Z20822 Contact with and (suspected) exposure to covid-19: Secondary | ICD-10-CM | POA: Diagnosis present

## 2019-12-04 DIAGNOSIS — Z9119 Patient's noncompliance with other medical treatment and regimen: Secondary | ICD-10-CM | POA: Diagnosis not present

## 2019-12-04 DIAGNOSIS — D72829 Elevated white blood cell count, unspecified: Secondary | ICD-10-CM | POA: Diagnosis present

## 2019-12-04 DIAGNOSIS — Z79899 Other long term (current) drug therapy: Secondary | ICD-10-CM | POA: Diagnosis not present

## 2019-12-04 DIAGNOSIS — Z8249 Family history of ischemic heart disease and other diseases of the circulatory system: Secondary | ICD-10-CM | POA: Diagnosis not present

## 2019-12-04 LAB — CBC WITH DIFFERENTIAL/PLATELET
Abs Immature Granulocytes: 0.1 10*3/uL — ABNORMAL HIGH (ref 0.00–0.07)
Basophils Absolute: 0 10*3/uL (ref 0.0–0.1)
Basophils Relative: 0 %
Eosinophils Absolute: 0 10*3/uL (ref 0.0–0.5)
Eosinophils Relative: 0 %
HCT: 44.9 % (ref 39.0–52.0)
Hemoglobin: 14.7 g/dL (ref 13.0–17.0)
Immature Granulocytes: 1 %
Lymphocytes Relative: 5 %
Lymphs Abs: 1 10*3/uL (ref 0.7–4.0)
MCH: 31.9 pg (ref 26.0–34.0)
MCHC: 32.7 g/dL (ref 30.0–36.0)
MCV: 97.4 fL (ref 80.0–100.0)
Monocytes Absolute: 0.9 10*3/uL (ref 0.1–1.0)
Monocytes Relative: 4 %
Neutro Abs: 17.8 10*3/uL — ABNORMAL HIGH (ref 1.7–7.7)
Neutrophils Relative %: 90 %
Platelets: 255 10*3/uL (ref 150–400)
RBC: 4.61 MIL/uL (ref 4.22–5.81)
RDW: 13.2 % (ref 11.5–15.5)
WBC: 19.8 10*3/uL — ABNORMAL HIGH (ref 4.0–10.5)
nRBC: 0 % (ref 0.0–0.2)

## 2019-12-04 LAB — URINALYSIS, ROUTINE W REFLEX MICROSCOPIC
Bilirubin Urine: NEGATIVE
Glucose, UA: NEGATIVE mg/dL
Hgb urine dipstick: NEGATIVE
Ketones, ur: 20 mg/dL — AB
Leukocytes,Ua: NEGATIVE
Nitrite: NEGATIVE
Protein, ur: NEGATIVE mg/dL
Specific Gravity, Urine: 1.012 (ref 1.005–1.030)
pH: 7 (ref 5.0–8.0)

## 2019-12-04 LAB — COMPREHENSIVE METABOLIC PANEL
ALT: 18 U/L (ref 0–44)
AST: 38 U/L (ref 15–41)
Albumin: 3.7 g/dL (ref 3.5–5.0)
Alkaline Phosphatase: 55 U/L (ref 38–126)
Anion gap: 12 (ref 5–15)
BUN: 5 mg/dL — ABNORMAL LOW (ref 6–20)
CO2: 21 mmol/L — ABNORMAL LOW (ref 22–32)
Calcium: 8.4 mg/dL — ABNORMAL LOW (ref 8.9–10.3)
Chloride: 107 mmol/L (ref 98–111)
Creatinine, Ser: 0.99 mg/dL (ref 0.61–1.24)
GFR, Estimated: >60 mL/min (ref >60)
Glucose, Bld: 99 mg/dL (ref 70–99)
Potassium: 3.9 mmol/L (ref 3.5–5.1)
Sodium: 140 mmol/L (ref 135–145)
Total Bilirubin: 0.6 mg/dL (ref 0.3–1.2)
Total Protein: 6.7 g/dL (ref 6.5–8.1)

## 2019-12-04 LAB — RAPID URINE DRUG SCREEN, HOSP PERFORMED
Amphetamines: NOT DETECTED
Barbiturates: NOT DETECTED
Benzodiazepines: NOT DETECTED
Cocaine: NOT DETECTED
Opiates: NOT DETECTED
Tetrahydrocannabinol: POSITIVE — AB

## 2019-12-04 LAB — CBG MONITORING, ED
Glucose-Capillary: 98 mg/dL (ref 70–99)
Glucose-Capillary: 79 mg/dL (ref 70–99)
Glucose-Capillary: 75 mg/dL (ref 70–99)

## 2019-12-04 LAB — CK: Total CK: 1568 U/L — ABNORMAL HIGH (ref 49–397)

## 2019-12-04 LAB — LACTIC ACID, PLASMA: Lactic Acid, Venous: 2.2 mmol/L (ref 0.5–1.9)

## 2019-12-04 LAB — AMMONIA: Ammonia: 22 umol/L (ref 9–35)

## 2019-12-04 LAB — HIV ANTIBODY (ROUTINE TESTING W REFLEX): HIV Screen 4th Generation wRfx: NONREACTIVE

## 2019-12-04 MED ORDER — SODIUM CHLORIDE 0.9 % IV BOLUS
1000.0000 mL | Freq: Once | INTRAVENOUS | Status: AC
  Administered 2019-12-04: 1000 mL via INTRAVENOUS

## 2019-12-04 MED ORDER — LEVETIRACETAM IN NACL 1500 MG/100ML IV SOLN
1500.0000 mg | Freq: Once | INTRAVENOUS | Status: AC
  Administered 2019-12-04: 1500 mg via INTRAVENOUS
  Filled 2019-12-04: qty 100

## 2019-12-04 MED ORDER — SODIUM CHLORIDE 0.9 % IV SOLN: INTRAVENOUS | Status: AC

## 2019-12-04 MED ORDER — ACETAMINOPHEN 325 MG PO TABS: 650.0000 mg | ORAL_TABLET | Freq: Four times a day (QID) | ORAL | Status: DC | PRN

## 2019-12-04 MED ORDER — ACETAMINOPHEN 650 MG RE SUPP: 650.0000 mg | Freq: Four times a day (QID) | RECTAL | Status: DC | PRN

## 2019-12-04 MED ORDER — LEVETIRACETAM IN NACL 500 MG/100ML IV SOLN
500.0000 mg | Freq: Two times a day (BID) | INTRAVENOUS | Status: DC
  Administered 2019-12-04 – 2019-12-05 (×3): 500 mg via INTRAVENOUS
  Filled 2019-12-04 (×4): qty 100

## 2019-12-04 NOTE — Progress Notes (Signed)
EEG complete - results pending 

## 2019-12-04 NOTE — ED Notes (Signed)
Found pt's head on the foot of the bed. Pt had to be assisted and repositioned on bed with [employer]. Pt seen touching his penis despite several requests not to do so.

## 2019-12-04 NOTE — Assessment & Plan Note (Addendum)
--  Secondary to presumed seizure, secondary to noncompliance with antiepileptic drug.  Supported by elevated CK.  CT head negative.  Complicated by marijuana use.  No evidence of infection or significant metabolic or electrolyte abnormalities. --Remains confused, encephalopathic.  EEG today, supportive care.  Continue Keppra.

## 2019-12-04 NOTE — Assessment & Plan Note (Signed)
--  Noncompliant with antiepileptic drug.  Complicated by marijuana use.  Alcohol and salicylate screen was negative.  Urinalysis negative.

## 2019-12-04 NOTE — Progress Notes (Signed)
PROGRESS NOTE  Miguel Black XLK:[id number] DOB: 14-Mar-1990 DOA: 12/03/2019 PCP: Cain Saupe, MD  Brief History   29 year old man PMH seizure disorder, multiple sclerosis, found lying down on the floor by his father and brought to the emergency department.  Reportedly noncompliant with antiepileptic drug.  Found to be encephalopathic and admitted for suspected prolonged postictal state as etiology.  Loaded with Keppra as recommended by neurology. Remains encephalopathic.  EEG, supportive care.   A & P  * Post-ictal state (HCC) --Secondary to presumed seizure, secondary to noncompliance with antiepileptic drug.  Supported by elevated CK.  CT head negative.  Complicated by marijuana use.  No evidence of infection or significant metabolic or electrolyte abnormalities. --Remains confused, encephalopathic.  EEG today, supportive care.  Continue Keppra.  Acute encephalopathy --Secondary to postictal state secondary seizure secondary to noncompliance with antiepileptic drug.  Seizures (HCC) --Noncompliant with antiepileptic drug.  Complicated by marijuana use.  Alcohol and salicylate screen was negative.  Urinalysis negative.  Marijuana use --Recommend cessation  MS (multiple sclerosis) (HCC) --Followed by [facility] neurology.  Follow-up as an outpatient.  Gets 108-month infusions of ocrelizumab.  Disposition Plan:  Discussion: Remains confused.  Supportive care.  EEG.  IV fluids.  Check CK in a.m.  Continue Keppra.  Status is: Observation  The patient remains OBS appropriate and will d/c before 2 midnights.  Dispo: The patient is from: Home              Anticipated d/c is to: Home              Anticipated d/c date is: 1 day              Patient currently is not medically stable to d/c.  DVT prophylaxis: SCDs Start: 12/04/19 0446   Code Status: Full Code Family Communication: none present  Brendia Sacks, MD  [facility] Direct contact: see [email or web address] (further  directions at bottom of note if needed) 7PM-7AM contact night coverage as at bottom of note 12/04/2019, 8:50 AM  LOS: 0 days   Significant Hospital Events   . 11/10 admission for postictal state   Consults:  .    Procedures:  .   Significant Diagnostic Tests:  . CT head no acute abnormalities   Micro Data:  .    Antimicrobials:  .   Interval History/Subjective  CC: f/u encephalopathy  Feels okay today, no complaints.  No pain.  No nausea or vomiting.  Objective   Vitals:  Vitals:   12/04/19 0700 12/04/19 0728  BP: 126/78 121/79  Pulse: (!) 102 72  Resp: 16 13  Temp:    SpO2: 97% 99%    Exam:  Physical Exam Vitals and nursing note reviewed.  Constitutional:      General: He is not in acute distress.    Appearance: He is not ill-appearing or toxic-appearing.  HENT:     Head: Normocephalic.  Cardiovascular:     Rate and Rhythm: Normal rate and regular rhythm.     Heart sounds: No murmur heard.  No friction rub. No gallop.   Pulmonary:     Effort: Pulmonary effort is normal. No respiratory distress.     Breath sounds: No wheezing or rales.  Abdominal:     General: There is no distension.     Palpations: Abdomen is soft.  Skin:    General: Skin is warm.  Neurological:     General: No focal deficit present.     Mental Status: He  is alert. He is disoriented.     Comments: Oriented to self, hospital.  Not month or year.  Psychiatric:     Comments: Confused.   Moves all extremities to command  I have personally reviewed the following:   Today's Data  . Complete metabolic panel unremarkable . Total CK 1568 . WBC 14.9 > 19.8  Scheduled Meds: Continuous Infusions: . sodium chloride    . levETIRAcetam      Principal Problem:   Post-ictal state (HCC) Active Problems:   Seizures (HCC)   Acute encephalopathy   Marijuana use   MS (multiple sclerosis) (HCC)   LOS: 0 days   How to contact the [facility] Attending or Consulting provider 7A - 7P or  covering provider during after hours 7P -7A, for this patient?  1. Check the care team in [facility] and look for a) attending/consulting [facility] provider listed and b) the [facility] team listed 2. Log into [email or web address] and use [facility]'s universal password to access. If you do not have the password, please contact the hospital operator. 3. Locate the [facility] provider you are looking for under [facility] and page to a number that you can be directly reached. 4. If you still have difficulty reaching the provider, please page the [facility] (Director on Call) for the Hospitalists listed on amion for assistance.

## 2019-12-04 NOTE — Assessment & Plan Note (Signed)
--  Followed by [facility] neurology.  Follow-up as an outpatient.  Gets 2-month infusions of ocrelizumab.

## 2019-12-04 NOTE — Assessment & Plan Note (Signed)
Recommend cessation. ?

## 2019-12-04 NOTE — ED Notes (Signed)
Currently waiting on keppra medication from main pharmacy.

## 2019-12-04 NOTE — ED Notes (Signed)
Report given to Daija RN.

## 2019-12-04 NOTE — H&P (Addendum)
History and Physical    JEREMIYAH CULLENS YTK:[id number] DOB: 21-Feb-1990 DOA: 12/03/2019  PCP: Cain Saupe, MD  Patient coming from: Home.  Chief Complaint: Confusion.  History obtained from the ER physician.  HPI: BELVIN GAUSS is a 29 y.o. male with known history of multiple sclerosis and seizures was found to be lying on the floor as seen by his father as reported by the ER physician.  Last known to be normal at around 4:30 PM yesterday.  Patient was found to be altered mental status and somnolent.  Per report he has not been taking his antiseizure medications.  ED Course: In the ER patient was responding to voice commands very open his eyes and tries to move his hands on deep sternal rub.  CT head was unremarkable.  Labs are largely unremarkable except for mildly elevated lactic acid and leukocytosis Covid test was negative EKG shows nonspecific ST-T changes.  ER physician discussed with on-call neurologist who advised loading patient on Keppra and starting on Keppra final IV every 12.  Since patient remains postictal and encephalopathic admitted for prolonged postictal phase.  Urine drug screen is positive for marijuana.  Acetaminophen levels and salicylate levels are negative.  Review of Systems: As per HPI, rest all negative.   Past Medical History:  Diagnosis Date  . MS (multiple sclerosis) (HCC)   . Seizure [facility])     Past Surgical History:  Procedure Laterality Date  . NO PAST SURGERIES       reports that he has been smoking. He has never used smokeless tobacco. He reports current alcohol use. He reports current drug use. Drug: Marijuana.  No Known Allergies  Family History  Problem Relation Age of Onset  . Hypertension Mother   . Healthy Father   . Healthy Sister   . Healthy Brother   . Diabetes Maternal Grandmother   . Hypertension Maternal Grandfather   . Hodgkin's lymphoma Brother   . Colon cancer Maternal Uncle 45    Prior to Admission medications    Medication Sig Start Date End Date Taking? Authorizing Provider  ocrelizumab (OCREVUS) 300 MG/10ML injection Inject 20 mLs (600 mg total) into the vein every 6 (six) months. 600 mg every 6 months. 09/16/19   Lomax, Amy, NP  Vitamin D, Ergocalciferol, (DRISDOL) 1.25 MG (50000 UT) CAPS capsule Take 1 capsule by mouth weekly for 26 weeks. 01/28/19   Sater, Pearletha Furl, MD  zonisamide (ZONEGRAN) 100 MG capsule Take 2 at night 01/22/19   Sater, Pearletha Furl, MD    Physical Exam: Constitutional: Moderately built and nourished. Vitals:   12/04/19 0115 12/04/19 0203 12/04/19 0330 12/04/19 0400  BP: 112/76 (!) 152/91 126/79 (!) 122/95  Pulse: 77 70 77 66  Resp: 19 17 17 14   Temp:      TempSrc:      SpO2: 100% 99% 100% 99%  Weight:      Height:       Eyes: Anicteric no pallor. ENMT: No discharge from the ears eyes nose or mouth. Neck: No neck rigidity no mass felt. Respiratory: No rhonchi or crepitations. Cardiovascular: S1-S2 heard. Abdomen: Soft nontender bowel sounds present. Musculoskeletal: No edema. Skin: No rash. Neurologic: Patient is still lethargic responds to verbal stimuli does not follow commands pupils are reacting to light. Psychiatric: Lethargic.   Labs on Admission: I have personally reviewed following labs and imaging studies  CBC: Recent Labs  Lab 12/03/19 1947  WBC 14.9*  NEUTROABS 13.3*  HGB 14.7  HCT 44.6  MCV 96.7  PLT 276   Basic Metabolic Panel: Recent Labs  Lab 12/03/19 1947  NA 137  K 4.4  CL 100  CO2 19*  GLUCOSE 87  BUN 10  CREATININE 1.05  CALCIUM 9.0   GFR: Estimated Creatinine Clearance: 110.8 mL/min (by C-G formula based on SCr of 1.05 mg/dL). Liver Function Tests: Recent Labs  Lab 12/03/19 1947  AST 30  ALT 17  ALKPHOS 64  BILITOT 0.6  PROT 7.3  ALBUMIN 4.4   No results for input(s): LIPASE, AMYLASE in the last 168 hours. No results for input(s): AMMONIA in the last 168 hours. Coagulation Profile: No results for input(s): INR,  PROTIME in the last 168 hours. Cardiac Enzymes: No results for input(s): CKTOTAL, CKMB, CKMBINDEX, TROPONINI in the last 168 hours. BNP (last 3 results) No results for input(s): PROBNP in the last 8760 hours. HbA1C: No results for input(s): HGBA1C in the last 72 hours. CBG: No results for input(s): GLUCAP in the last 168 hours. Lipid Profile: No results for input(s): CHOL, HDL, LDLCALC, TRIG, CHOLHDL, LDLDIRECT in the last 72 hours. Thyroid Function Tests: No results for input(s): TSH, T4TOTAL, FREET4, T3FREE, THYROIDAB in the last 72 hours. Anemia Panel: No results for input(s): VITAMINB12, FOLATE, FERRITIN, TIBC, IRON, RETICCTPCT in the last 72 hours. Urine analysis:    Component Value Date/Time   COLORURINE YELLOW 12/04/2019 0103   APPEARANCEUR CLEAR 12/04/2019 0103   LABSPEC 1.012 12/04/2019 0103   PHURINE 7.0 12/04/2019 0103   GLUCOSEU NEGATIVE 12/04/2019 0103   HGBUR NEGATIVE 12/04/2019 0103   BILIRUBINUR NEGATIVE 12/04/2019 0103   KETONESUR 20 (A) 12/04/2019 0103   PROTEINUR NEGATIVE 12/04/2019 0103   NITRITE NEGATIVE 12/04/2019 0103   LEUKOCYTESUR NEGATIVE 12/04/2019 0103   Sepsis Labs: @LABRCNTIP (procalcitonin:4,lacticidven:4) ) Recent Results (from the past 240 hour(s))  Respiratory Panel by RT PCR (Flu A&B, Covid) - Nasopharyngeal Swab     Status: None   Collection Time: 12/03/19  7:47 PM   Specimen: Nasopharyngeal Swab  Result Value Ref Range Status   SARS Coronavirus 2 by RT PCR NEGATIVE NEGATIVE Final    Comment: (NOTE) SARS-CoV-2 target nucleic acids are NOT DETECTED.  The SARS-CoV-2 RNA is generally detectable in upper respiratoy specimens during the acute phase of infection. The lowest concentration of SARS-CoV-2 viral copies this assay can detect is 131 copies/mL. A negative result does not preclude SARS-Cov-2 infection and should not be used as the sole basis for treatment or other patient management decisions. A negative result may occur with   improper specimen collection/handling, submission of specimen other than nasopharyngeal swab, presence of viral mutation(s) within the areas targeted by this assay, and inadequate number of viral copies (<131 copies/mL). A negative result must be combined with clinical observations, patient history, and epidemiological information. The expected result is Negative.  Fact Sheet for Patients:  13/09/21  Fact Sheet for Healthcare Providers:  https://www.moore.com/  This test is no t yet approved or cleared by the https://www.young.biz/ FDA and  has been authorized for detection and/or diagnosis of SARS-CoV-2 by FDA under an Emergency Use Authorization (EUA). This EUA will remain  in effect (meaning this test can be used) for the duration of the COVID-19 declaration under Section 564(b)(1) of the Act, 21 U.S.C. section 360bbb-3(b)(1), unless the authorization is terminated or revoked sooner.     Influenza A by PCR NEGATIVE NEGATIVE Final   Influenza B by PCR NEGATIVE NEGATIVE Final    Comment: (NOTE) The Xpert Xpress  SARS-CoV-2/FLU/RSV assay is intended as an aid in  the diagnosis of influenza from Nasopharyngeal swab specimens and  should not be used as a sole basis for treatment. Nasal washings and  aspirates are unacceptable for Xpert Xpress SARS-CoV-2/FLU/RSV  testing.  Fact Sheet for Patients: https://www.moore.com/  Fact Sheet for Healthcare Providers: https://www.young.biz/  This test is not yet approved or cleared by the [place] FDA and  has been authorized for detection and/or diagnosis of SARS-CoV-2 by  FDA under an Emergency Use Authorization (EUA). This EUA will remain  in effect (meaning this test can be used) for the duration of the  Covid-19 declaration under Section 564(b)(1) of the Act, 21  U.S.C. section 360bbb-3(b)(1), unless the authorization is  terminated or  revoked. Performed at [facility], [address] [place], [place] [address]      Radiological Exams on Admission: CT Head Wo Contrast  Result Date: 12/03/2019 CLINICAL DATA:  Delirium, multiple sclerosis EXAM: CT HEAD WITHOUT CONTRAST TECHNIQUE: Contiguous axial images were obtained from the base of the skull through the vertex without intravenous contrast. COMPARISON:  07/12/2018 FINDINGS: Brain: No acute infarct or hemorrhage. Hypodensities in the periventricular white matter, most pronounced in the left parietal region, compatible with known history of multiple sclerosis. Lateral ventricles and remaining midline structures are unremarkable. No acute extra-axial fluid collections. No mass effect. Vascular: No hyperdense vessel or unexpected calcification. Skull: Normal. Negative for fracture or focal lesion. Sinuses/Orbits: Stable polypoid mucosal thickening or mucous retention cyst right maxillary sinus. Remaining sinuses are clear. Other: None. IMPRESSION: 1. Stable hypodensities in the periventricular white matter compatible with patient's known history of multiple sclerosis. 2. No acute infarct or hemorrhage. Electronically Signed   By: Sharlet Salina M.D.   On: 12/03/2019 20:19    EKG: Independently reviewed.  Normal sinus rhythm with nonspecific ST changes.  Assessment/Plan Principal Problem:   Acute encephalopathy Active Problems:   MS (multiple sclerosis) (HCC)   Seizures (HCC)   Post-ictal state (HCC)    1. Encephalopathy likely from postictal state for which at this time neurologist has placed patient on IV Keppra after loading with 1500 mg and presently on finding IV every 12.  We will keep patient n.p.o. until patient is alert awake.  Closely monitor.  Patient is presently likely encephalopathic from prolonged postictal phase.  No signs of any active seizures since patient responds to verbal stimuli. 2. History of multiple sclerosis gets 6 monthly infusions of  ocrelizumab.  Addendum -I was able to reach patient's mother and patient mother states when they came to check on him patient was on the floor unresponsive and was not taking his antiseizure medication for last few month because he did not like it.    DVT prophylaxis: SCDs for now. Code Status: Full code. Family Communication: Patient's mother. Disposition Plan: Home when stable. Consults called: ER physician discussed with neurologist. Admission status: Observation.   Eduard Clos MD [facility] Pager [phone].  If 7PM-7AM, please contact night-coverage [email or web address] Password [facility]  12/04/2019, 4:52 AM

## 2019-12-04 NOTE — Assessment & Plan Note (Signed)
--  Secondary to postictal state secondary seizure secondary to noncompliance with antiepileptic drug.

## 2019-12-04 NOTE — Procedures (Addendum)
Patient Name: Miguel Black  MRN: [id number]  Epilepsy Attending: Charlsie Quest  Referring Physician/Provider: Dr. Brendia Sacks Date: 12/04/2019 Duration: 24.58 mins  Patient history: 29 year old male with history of epilepsy who was found down.  EEG to evaluate for seizures.  Level of alertness: Awake, asleep  AEDs during EEG study: Keppra  Technical aspects: This EEG study was done with scalp electrodes positioned according to the 10-20 International system of electrode placement. Electrical activity was acquired at a sampling rate of 500Hz  and reviewed with a high frequency filter of 70Hz  and a low frequency filter of 1Hz . EEG data were recorded continuously and digitally stored.   Description: During awake state, no clear posterior dominant rhythm was seen.  Sleep was characterized by vertex waves, sleep spindles (12 to 14 Hz), maximal frontocentral region.  EEG showed continuous generalized 3 to 6 Hz theta-delta slowing.  There is an excessive amount of 15 to 18 Hz beta activity  distributed symmetrically and diffusely.  Sharp waves are also noted in left temporal region.  Hyperventilation and photic stimulation were not performed.     ABNORMALITY -Sharp wave, left temporal region -Continuous slow, generalized -Excessive beta, generalized  IMPRESSION: This study showed evidence of epileptogenicity arising from left temporal region.  Additionally, there is also moderate diffuse encephalopathy, nonspecific etiology could be secondary to medications, postictal state. The excessive beta activity seen in the background is most likely due to the effect of benzodiazepine and is a benign EEG pattern. No seizures were seen throughout the recording.  [name] 

## 2019-12-04 NOTE — Progress Notes (Signed)
Received the pt from the ED, alert and oriented X4, implemented MD orders, call light in reach, will continue to monitor   12/04/19 2019  Vitals  Temp 98.5 F (36.9 C)  Temp Source Oral  BP 115/73  MAP (mmHg) 84  BP Location Left Arm  BP Method Automatic  Patient Position (if appropriate) Lying  Pulse Rate 66  Resp 18  MEWS COLOR  MEWS Score Color Green  Oxygen Therapy  SpO2 100 %  O2 Device Room Air  MEWS Score  MEWS Temp 0  MEWS Systolic 0  MEWS Pulse 0  MEWS RR 0  MEWS LOC 1  MEWS Score 1

## 2019-12-04 NOTE — Hospital Course (Addendum)
29 year old man PMH seizure disorder, multiple sclerosis, found lying down on the floor by his father and brought to the emergency department.  Reportedly noncompliant with antiepileptic drug.  Found to be encephalopathic and admitted for suspected prolonged postictal state as etiology.  Loaded with Keppra as recommended by neurology. Post-ictal state gradually resolved w/ supportive care and EEG w/o seizures. Discharge in good condition on Keppra.

## 2019-12-04 NOTE — ED Notes (Signed)
Pt had an episode of urinary incontinence. Sheets changed,pt able to follow commands to turn to either side.  Denies pain at this time. Falling asleep while RN is drawing blood and asking questions.   Skin is warm,dry and intact Pt does not appear in distress

## 2019-12-05 LAB — BASIC METABOLIC PANEL
Anion gap: 13 (ref 5–15)
BUN: 9 mg/dL (ref 6–20)
CO2: 19 mmol/L — ABNORMAL LOW (ref 22–32)
Calcium: 8.3 mg/dL — ABNORMAL LOW (ref 8.9–10.3)
Chloride: 108 mmol/L (ref 98–111)
Creatinine, Ser: 1.16 mg/dL (ref 0.61–1.24)
GFR, Estimated: >60 mL/min (ref >60)
Glucose, Bld: 85 mg/dL (ref 70–99)
Potassium: 3.7 mmol/L (ref 3.5–5.1)
Sodium: 140 mmol/L (ref 135–145)

## 2019-12-05 LAB — CBC
HCT: 42.4 % (ref 39.0–52.0)
Hemoglobin: 13.6 g/dL (ref 13.0–17.0)
MCH: 31.4 pg (ref 26.0–34.0)
MCHC: 32.1 g/dL (ref 30.0–36.0)
MCV: 97.9 fL (ref 80.0–100.0)
Platelets: 235 10*3/uL (ref 150–400)
RBC: 4.33 MIL/uL (ref 4.22–5.81)
RDW: 13.2 % (ref 11.5–15.5)
WBC: 13.5 10*3/uL — ABNORMAL HIGH (ref 4.0–10.5)
nRBC: 0 % (ref 0.0–0.2)

## 2019-12-05 LAB — CK: Total CK: 837 U/L — ABNORMAL HIGH (ref 49–397)

## 2019-12-05 MED ORDER — LEVETIRACETAM 500 MG PO TABS: 500.0000 mg | ORAL_TABLET | Freq: Two times a day (BID) | ORAL | 2 refills | Status: DC

## 2019-12-05 NOTE — Discharge Summary (Signed)
Physician Discharge Summary  Miguel Black IWO:[id number] DOB: March 30, 1990 DOA: 12/03/2019  PCP: Cain Saupe, MD  Admit date: 12/03/2019 Discharge date: 12/05/2019  Recommendations for Outpatient Follow-up:  1. Follow-up seizure disorder   Follow-up Information    Fulp, Cammie, MD Follow up.   Specialty: Family Medicine Why: as needed Contact information: [address] [place] [address] [phone]        Asa Lente, MD. Schedule an appointment as soon as possible for a visit in 2 week(s).   Specialty: Neurology Contact information: [address] [place] [address] [phone]                Discharge Diagnoses: Principal diagnosis is #1 Principal Problem:   Post-ictal state (HCC) Active Problems:   Seizures (HCC)   Acute encephalopathy   Marijuana use   MS (multiple sclerosis) (HCC)   Postictal state (HCC)   Discharge Condition: improved Disposition: home  Diet recommendation:  Regular   Filed Weights   12/03/19 1926  Weight: 74.8 kg    HPI/Hospital Course:   29 year old man PMH seizure disorder, multiple sclerosis, found lying down on the floor by his father and brought to the emergency department.  Reportedly noncompliant with antiepileptic drug.  Found to be encephalopathic and admitted for suspected prolonged postictal state as etiology.  Loaded with Keppra as recommended by neurology. Post-ictal state gradually resolved w/ supportive care and EEG w/o seizures. Discharge in good condition on Keppra.  * Post-ictal state (HCC) --Secondary to seizure, secondary to noncompliance with antiepileptic drug.  Supported by elevated CK.  CT head negative.  Complicated by marijuana use.  No evidence of infection or significant metabolic or electrolyte abnormalities. --resolved  Acute encephalopathy --Secondary to postictal state secondary seizure secondary to noncompliance with antiepileptic drug. --resolved  Seizures  (HCC) --Noncompliant with Keppra as outpt.  Complicated by marijuana use.  Alcohol and salicylate screen was negative.  Urinalysis negative. --home on Keppra, patient reports he will take --f/u w/ his established Neurologist Dr. Epimenio Foot   Marijuana use --Recommend cessation  MS (multiple sclerosis) (HCC) --Followed by [facility] neurology.  Follow-up as an outpatient.  Gets 66-month infusions of ocrelizumab.    Today's assessment: S: CC: f/u seizure  Feels better, eating fine, no complaints, ready to go home.  O: Vitals:  Vitals:   12/05/19 0757 12/05/19 1310  BP: 113/66 111/84  Pulse: 66 68  Resp: 18 16  Temp: 99.2 F (37.3 C) 98.5 F (36.9 C)  SpO2: 99% 100%    Constitutional:  . Appears calm and comfortable ENMT:  . grossly normal hearing  Respiratory:  . CTA bilaterally, no w/r/r.  . Respiratory effort normal.  Cardiovascular:  . RRR, no m/r/g . No LE extremity edema   Musculoskeletal:  . RUE, LUE, RLE, LLE   . strength and tone appear grossly normal Psychiatric:  . Mental status o Mood, affect appropriate o Oriented to person, place, time   EEG no seizures BMP unremarkable CK down to 837 WBC down to 13.5  Discharge Instructions  Discharge Instructions    Ambulatory referral to Neurology   Complete by: As directed    An appointment is requested in approximately: 2 weeks  F/u seizure   Diet - low sodium heart healthy   Complete by: As directed    Discharge instructions   Complete by: As directed    Call your physician or seek immediate medical attention for seizures, passing out, prolonged confusion or worsening of condition. Be  sure to take Keppra as directed to prevent seizures. Be sure to follow-up with your neurologist. Note seizure precautions.  Per [facility] statutes, patients with seizures are not allowed to drive until they have been seizure-free for six months.   Use caution when using heavy equipment or power tools. Avoid working  on ladders or at heights. Take showers instead of baths. Ensure the water temperature is not too high on the home water heater. Do not go swimming alone. Do not lock yourself in a room alone (i.e. bathroom). When caring for infants or small children, sit down when holding, feeding, or changing them to minimize risk of injury to the child in the event you have a seizure. Maintain good sleep hygiene. Avoid alcohol.   If patienthas another seizure, call 911 and bring them back to the ED if: A. The seizure lasts longer than 5 minutes.  B. The patient doesn't wake shortly after the seizure or has new problems such as difficulty seeing, speaking or moving following the seizure C. The patient was injured during the seizure D. The patient has a temperature over 102 F (39C) E. The patient vomited during the seizure and now is having trouble breathing   Increase activity slowly   Complete by: As directed      Allergies as of 12/05/2019   No Known Allergies     Medication List    STOP taking these medications   Vitamin D (Ergocalciferol) 1.25 MG (50000 UNIT) Caps capsule Commonly known as: DRISDOL   zonisamide 100 MG capsule Commonly known as: ZONEGRAN     TAKE these medications   cholecalciferol 25 MCG (1000 UNIT) tablet Commonly known as: VITAMIN D3 Take 1,000 Units by mouth daily.   levETIRAcetam 500 MG tablet Commonly known as: Keppra Take 1 tablet (500 mg total) by mouth 2 (two) times daily.   Ocrevus 300 MG/10ML injection Generic drug: ocrelizumab Inject 20 mLs (600 mg total) into the vein every 6 (six) months. 600 mg every 6 months. What changed: additional instructions      No Known Allergies  The results of significant diagnostics from this hospitalization (including imaging, microbiology, ancillary and laboratory) are listed below for reference.    Significant Diagnostic Studies: CT Head Wo Contrast  Result Date: 12/03/2019 CLINICAL DATA:  Delirium, multiple  sclerosis EXAM: CT HEAD WITHOUT CONTRAST TECHNIQUE: Contiguous axial images were obtained from the base of the skull through the vertex without intravenous contrast. COMPARISON:  07/12/2018 FINDINGS: Brain: No acute infarct or hemorrhage. Hypodensities in the periventricular white matter, most pronounced in the left parietal region, compatible with known history of multiple sclerosis. Lateral ventricles and remaining midline structures are unremarkable. No acute extra-axial fluid collections. No mass effect. Vascular: No hyperdense vessel or unexpected calcification. Skull: Normal. Negative for fracture or focal lesion. Sinuses/Orbits: Stable polypoid mucosal thickening or mucous retention cyst right maxillary sinus. Remaining sinuses are clear. Other: None. IMPRESSION: 1. Stable hypodensities in the periventricular white matter compatible with patient's known history of multiple sclerosis. 2. No acute infarct or hemorrhage. Electronically Signed   By: Sharlet Salina M.D.   On: 12/03/2019 20:19   EEG adult  Result Date: 12/04/2019 Charlsie Quest, MD     12/04/2019 12:13 PM Patient Name: Miguel Black MRN: [id number] Epilepsy Attending: Charlsie Quest Referring Physician/Provider: Dr. Brendia Sacks Date: 12/04/2019 Duration: 24.58 mins Patient history: 29 year old male with history of epilepsy who was found down.  EEG to evaluate for seizures. Level of alertness:  Awake, asleep AEDs during EEG study: Keppra Technical aspects: This EEG study was done with scalp electrodes positioned according to the 10-20 International system of electrode placement. Electrical activity was acquired at a sampling rate of 500Hz  and reviewed with a high frequency filter of 70Hz  and a low frequency filter of 1Hz . EEG data were recorded continuously and digitally stored. Description: During awake state, no clear posterior dominant rhythm was seen.  Sleep was characterized by vertex waves, sleep spindles (12 to 14 Hz),  maximal frontocentral region.  EEG showed continuous generalized 3 to 6 Hz theta-delta slowing.  There is an excessive amount of 15 to 18 Hz beta activity  distributed symmetrically and diffusely.  Sharp waves are also noted in left temporal region.  Hyperventilation and photic stimulation were not performed.   ABNORMALITY -Sharp wave, left temporal region -Continuous slow, generalized -Excessive beta, generalized IMPRESSION: This study showed evidence of epileptogenicity arising from left temporal region.  Additionally, there is also moderate diffuse encephalopathy, nonspecific etiology could be secondary to medications, postictal state. The excessive beta activity seen in the background is most likely due to the effect of benzodiazepine and is a benign EEG pattern. No seizures were seen throughout the recording.    Microbiology: Recent Results (from the past 240 hour(s))  Respiratory Panel by RT PCR (Flu A&B, Covid) - Nasopharyngeal Swab     Status: None   Collection Time: 12/03/19  7:47 PM   Specimen: Nasopharyngeal Swab  Result Value Ref Range Status   SARS Coronavirus 2 by RT PCR NEGATIVE NEGATIVE Final    Comment: (NOTE) SARS-CoV-2 target nucleic acids are NOT DETECTED.  The SARS-CoV-2 RNA is generally detectable in upper respiratoy specimens during the acute phase of infection. The lowest concentration of SARS-CoV-2 viral copies this assay can detect is 131 copies/mL. A negative result does not preclude SARS-Cov-2 infection and should not be used as the sole basis for treatment or other patient management decisions. A negative result may occur with  improper specimen collection/handling, submission of specimen other than nasopharyngeal swab, presence of viral mutation(s) within the areas targeted by this assay, and inadequate number of viral copies (<131 copies/mL). A negative result must be combined with clinical observations, patient history, and epidemiological  information. The expected result is Negative.  Fact Sheet for Patients:   Fact Sheet for Healthcare Providers:  Charlsie Quest  This test is no t yet approved or cleared by the 13/09/21 FDA and  has been authorized for detection and/or diagnosis of SARS-CoV-2 by FDA under an Emergency Use Authorization (EUA). This EUA will remain  in effect (meaning this test can be used) for the duration of the COVID-19 declaration under Section 564(b)(1) of the Act, 21 U.S.C. section 360bbb-3(b)(1), unless the authorization is terminated or revoked sooner.     Influenza A by PCR NEGATIVE NEGATIVE Final   Influenza B by PCR NEGATIVE NEGATIVE Final    Comment: (NOTE) The Xpert Xpress SARS-CoV-2/FLU/RSV assay is intended as an aid in  the diagnosis of influenza from Nasopharyngeal swab specimens and  should not be used as a sole basis for treatment. Nasal washings and  aspirates are unacceptable for Xpert Xpress SARS-CoV-2/FLU/RSV  testing.  Fact Sheet for Patients: https://www.moore.com/  Fact Sheet for Healthcare Providers: https://www.young.biz/  This test is not yet approved or cleared by the [place] FDA and  has been authorized for detection and/or diagnosis of SARS-CoV-2 by  FDA under an Emergency Use Authorization (EUA). This EUA  will remain  in effect (meaning this test can be used) for the duration of the  Covid-19 declaration under Section 564(b)(1) of the Act, 21  U.S.C. section 360bbb-3(b)(1), unless the authorization is  terminated or revoked. Performed at [facility], [address] [place], [place] [address]      Labs: Basic Metabolic Panel: Recent Labs  Lab 12/03/19 1947 12/04/19 0725 12/05/19 0804  NA 137 140 140  K 4.4 3.9 3.7  CL 100 107 108  CO2 19* 21* 19*  GLUCOSE 87 99 85  BUN 10 5* 9  CREATININE 1.05 0.99 1.16  CALCIUM 9.0 8.4*  8.3*   Liver Function Tests: Recent Labs  Lab 12/03/19 1947 12/04/19 0725  AST 30 38  ALT 17 18  ALKPHOS 64 55  BILITOT 0.6 0.6  PROT 7.3 6.7  ALBUMIN 4.4 3.7   Recent Labs  Lab 12/04/19 0720  AMMONIA 22   CBC: Recent Labs  Lab 12/03/19 1947 12/04/19 0725 12/05/19 0804  WBC 14.9* 19.8* 13.5*  NEUTROABS 13.3* 17.8*  --   HGB 14.7 14.7 13.6  HCT 44.6 44.9 42.4  MCV 96.7 97.4 97.9  PLT 276 255 235   Cardiac Enzymes: Recent Labs  Lab 12/04/19 0725 12/05/19 0804  CKTOTAL 1,568* 837*   CBG: Recent Labs  Lab 12/04/19 0723 12/04/19 1342 12/04/19 1805  GLUCAP 98 79 75    Principal Problem:   Post-ictal state (HCC) Active Problems:   Seizures (HCC)   Acute encephalopathy   Marijuana use   MS (multiple sclerosis) (HCC)   Postictal state (HCC)   Time coordinating discharge: 20 minutes  Signed:  Brendia Sacks, MD  [facility]  12/05/2019, 5:32 PM

## 2019-12-06 ENCOUNTER — Telehealth: Payer: Self-pay

## 2019-12-06 NOTE — Telephone Encounter (Signed)
Transition Care Management Follow-up Telephone Call Date of discharge and from where:[facility] 12/05/2019 How have you been since you were released from the hospital? Better  Any questions or concerns? No questions/concerns reported.  Items Reviewed: Did the pt receive and understand the discharge instructions provided? Stated that have the instructions and have no questions.  Medications obtained and verified? said that they have the medication list  and the hospital staff reviewed them in detail prior to discharge. said that he has all of the medications and they have no questions.  Any new allergies since your discharge? None reported  Do you have support at home? Yes, mother Other (ie: DME, Home Health, etc)       Functional Questionnaire: (I = Independent and D = Dependent) ADL's:  Independent.    Mom helps sometimes   Follow up appointments reviewed:    PCP Hospital f/u appt confirmed?12/26/2019 @ 1050. [facility] confirmed? None scheduled at this time   Are transportation arrangements needed? have transportation    If their condition worsens, is the pt aware to call  their PCP or go to the ED? Yes.Made pt aware if condition worsen or start experiencing rapid weight gain, chest pain, diff breathing, SOB, high fevers, or bleading to refer imediately to ED for further evaluation.   Was the patient provided with contact information for the PCP's office or ED? He has the phone number   Was the pt encouraged to call back with questions or concerns?yes  Stressed the importance to take meds as prescribed and follow strict MD instructions. Verbalized understanding

## 2019-12-06 NOTE — Telephone Encounter (Signed)
Transition Care Management Follow-up Telephone Call Date of discharge and from where:[facility] on 12/05/2019  Called pt at [phone] Unable to reach or leave VM.  Pt does not have a appt scheduled

## 2019-12-25 ENCOUNTER — Ambulatory Visit: Payer: Medicare (Managed Care) | Admitting: Family Medicine

## 2020-01-08 ENCOUNTER — Ambulatory Visit: Payer: Medicare (Managed Care) | Admitting: Physician Assistant

## 2020-01-15 ENCOUNTER — Other Ambulatory Visit: Payer: Self-pay

## 2020-01-15 ENCOUNTER — Ambulatory Visit: Payer: Medicare (Managed Care) | Attending: Family Medicine | Admitting: Internal Medicine

## 2020-01-15 ENCOUNTER — Encounter: Payer: Self-pay | Admitting: Internal Medicine

## 2020-01-15 ENCOUNTER — Ambulatory Visit: Payer: Medicare (Managed Care) | Admitting: Family Medicine

## 2020-01-15 DIAGNOSIS — F1290 Cannabis use, unspecified, uncomplicated: Secondary | ICD-10-CM

## 2020-01-15 DIAGNOSIS — R569 Unspecified convulsions: Secondary | ICD-10-CM

## 2020-01-15 NOTE — Assessment & Plan Note (Signed)
Continue current meds.  Discussed Covid vaccine in context of his medication. He is at high risk of adverse outocme with Covid- he will seek a vaccine.

## 2020-01-15 NOTE — Patient Instructions (Signed)
Cov[facility]- covid- [phone] Need to stop smoking tobacco and decrease marijuana use.

## 2020-01-15 NOTE — Assessment & Plan Note (Signed)
Multifactorial.  Cause is related to MS but was precipitated by nonadherence to keppra. He now has the medication- reiterated need to take meds as prescribed. He voices understanding.

## 2020-01-15 NOTE — Progress Notes (Signed)
Hospital follow up-   Would like to discuss Covid vaccine since he is on Ocrevus  Seen post hospital visit. He has MS with hx of seizures. He was restarted on antiseizure meds and is now doing well   reviewed hospital imaging- CT consistent with MS. He also had elevated CK, consistent with seizure activity.    He continues to use "a lot" of marijuana.  He continues to smoke cigarettes-  Past Medical History:  Diagnosis Date  . MS (multiple sclerosis) (HCC)   . Seizure [facility])     Social History   Socioeconomic History  . Marital status: Unknown    Spouse name: Not on file  . Number of children: Not on file  . Years of education: Not on file  . Highest education level: Not on file  Occupational History  . Not on file  Tobacco Use  . Smoking status: Current Some Day Smoker  . Smokeless tobacco: Never Used  Vaping Use  . Vaping Use: Never used  Substance and Sexual Activity  . Alcohol use: Yes  . Drug use: Yes    Types: Marijuana  . Sexual activity: Not on file  Other Topics Concern  . Not on file  Social History Narrative  . Not on file   Social Determinants of Health   Financial Resource Strain: Not on file  Food Insecurity: Not on file  Transportation Needs: Not on file  Physical Activity: Not on file  Stress: Not on file  Social Connections: Not on file  Intimate Partner Violence: Not on file    Past Surgical History:  Procedure Laterality Date  . NO PAST SURGERIES      Family History  Problem Relation Age of Onset  . Hypertension Mother   . Healthy Father   . Healthy Sister   . Healthy Brother   . Diabetes Maternal Grandmother   . Hypertension Maternal Grandfather   . Hodgkin's lymphoma Brother   . Colon cancer Maternal Uncle 45    No Known Allergies  Current Outpatient Medications on File Prior to Visit  Medication Sig Dispense Refill  . cholecalciferol (VITAMIN D3) 25 MCG (1000 UNIT) tablet Take 1,000 Units by mouth daily.    Marland Kitchen  levETIRAcetam (KEPPRA) 500 MG tablet Take 1 tablet (500 mg total) by mouth 2 (two) times daily. 60 tablet 2  . ocrelizumab (OCREVUS) 300 MG/10ML injection Inject 20 mLs (600 mg total) into the vein every 6 (six) months. 600 mg every 6 months. (Patient taking differently: Inject 600 mg into the vein every 6 (six) months.) 20 mL 1   No current facility-administered medications on file prior to visit.     patient denies chest pain, shortness of breath, orthopnea. Denies lower extremity edema, abdominal pain, change in appetite, change in bowel movements. Patient denies rashes, musculoskeletal complaints. No other specific complaints in a complete review of systems.   BP 118/72   Pulse 78   Temp 98.5 F (36.9 C)   Resp 16   Ht 5\' 8"  (1.727 m)   Wt 161 lb (73 kg)   SpO2 96%   BMI 24.48 kg/m   well-developed well-nourished male in no acute distress. HEENT exam atraumatic, normocephalic, neck supple without jugular venous distention. Chest clear to auscultation cardiac exam S1-S2 are regular. Abdominal exam overweight with bowel sounds, soft and nontender. Extremities no edema. Neurologic exam is alert and uses a cane for ambulation.  Seizures (HCC) Multifactorial.  Cause is related to MS but was precipitated by  nonadherence to keppra. He now has the medication- reiterated need to take meds as prescribed. He voices understanding.   Marijuana use Continue current meds.  Discussed Covid vaccine in context of his medication. He is at high risk of adverse outocme with Covid- he will seek a vaccine.

## 2020-01-21 ENCOUNTER — Ambulatory Visit: Payer: Medicare (Managed Care)

## 2020-01-28 ENCOUNTER — Encounter: Payer: Self-pay | Admitting: Neurology

## 2020-01-28 ENCOUNTER — Ambulatory Visit: Payer: Medicare (Managed Care) | Admitting: Neurology

## 2020-01-28 VITALS — BP 121/76 | HR 72 | Ht 68.0 in | Wt 154.0 lb

## 2020-01-28 DIAGNOSIS — G35 Multiple sclerosis: Secondary | ICD-10-CM

## 2020-01-28 DIAGNOSIS — Z79899 Other long term (current) drug therapy: Secondary | ICD-10-CM

## 2020-01-28 DIAGNOSIS — G40909 Epilepsy, unspecified, not intractable, without status epilepticus: Secondary | ICD-10-CM | POA: Diagnosis not present

## 2020-01-28 DIAGNOSIS — R269 Unspecified abnormalities of gait and mobility: Secondary | ICD-10-CM

## 2020-01-28 DIAGNOSIS — R531 Weakness: Secondary | ICD-10-CM | POA: Diagnosis not present

## 2020-01-28 NOTE — Progress Notes (Signed)
[facility]  PATIENT: Miguel Black DOB: November 06, 1990  REFERRING DOCTOR OR PCP:  Cain Saupe (PCP) SOURCE: Patient, notes from emergency room, lab results and imaging studies personally reviewed.  _________________________________   HISTORICAL  CHIEF COMPLAINT:  Chief Complaint  Patient presents with  . New Patient (Initial Visit)    RM 13, alone. ER referral for seizures.  Already follows at our office for MS. Currently on Ocrevus q 6 mos. Receives at [facility]. Next infusion: 04/20/20 at 8:00am. Provided him appt card reminder today. Has been on this for the past year or so. Ambulates with cane. Past DMT's: Josue Hector    HISTORY OF PRESENT ILLNESS:  He had a seizure 12/03/2019.   With this event, his father walked into his room and he was unresponsive against the computer desk and difficult to arouse.    He went to the ED.   He remained groggy and slept 11 hours.   He returned tohis baseline over the next day.    While there he had labowrk.  Calcium slightly low at 8.4; WBC elevated at 19.8; CK elevated at 1568.  UA was normal.  HIV negative.   The next day, repeat CK was improved at 837; WBC improved wo 13.5.    He was not on any anti-epileptic medication at the time (Keppra was prescribed but he had s.e. and did not take it long term).  Heis on Keppra now 500 mg po bid.  He feels his MS is stable.     He ois on Ocrevus, last dose 09/2019 and next dise 04/20/20.   He feels neurologically stable.   He uses a cne to ambulate.  He is weaker on his left side than right.  Bladder function is fine.   He notes vision is blurry on the left and he has color desaturation out of the left eye.   His right eye has good vision.    He notes fatigue that is minimal in the morning but much worse later in the day.   Fatigue is physical > cognitive.   He sleeps well.    He feels focus and attention are good though cognitive skills sometimes are a little off.   He feels  mood is fine.       MS history:  He was diagnosed with relapsing remitting MS in June 2015 after presenting with reduced vision OS while he was in [facility] [place].   In retrospect he had had some neurologic symptoms a few years before the disgnosis.   He had trouble running before his diagnosis.    He was placed on Tecfidera but he had breakthrough activity.     Just a year later, he was switched to Tysabri and has done better.  He moved to [facility]  June 2019 and started seeing the MS Center at [facility] (Dr. Gaynelle Adu).   He has not had any exacerbations and feels his MS has been stable.   Initially, he continue Tysabri.  He tolerated Tysabri well but going back and forth to an infusion center monthly with difficulty.    He has been JCV Ab negative.   His last infusion was off Tysabri was in summer 2019 and he switched to Ocrevus at the end of 2019.   His last JCV Ab test I have a result for is 07/04/2017 (negative 0.14).   I saw him 12/2018 and he transferred infusions to [facility].     Seizure history:  His  first one was 2018 and second one was 07/12/2018.   He notes being dehydrated the second one.    Both seizures were GTC by description with LOC.    He slept > a day after each one.   He was started on Keppra but stopped after a week or so due to side effects.  He had a third seizure November 2021 when he was found by his dad in a postictal state.  He was placed on Keppra has continued to take it.  Imaging MRI of the brain 07/13/2018 showed multiple T2/flair hyperintense foci.  Posterior fossa foci are noted in the posterior right medulla, bilateral cerebellar hemispheres, pons, right cerebellar peduncle and cerebral peduncle.  There are foci in the thalamus and multiple foci in the periventricular, juxtacortical and deep white matter.  None of the foci appear to be acute or enhance.  Lab tests: Hepatitis B labs from 2020 at [facility] showed no evidence of chronic infection.  :  TB Gold was  negative 01/22/2019.  Vitamin D 01/22/2019 was 30.1.  IgG/IgA/IgM was normal 01/22/2019.  CD19/CD20 showed less then 0.5% of lymphocytes were B cells.   REVIEW OF SYSTEMS: Constitutional: No fevers, chills, sweats, or change in appetite Eyes: No visual changes, double vision, eye pain Ear, nose and throat: No hearing loss, ear pain, nasal congestion, sore throat Cardiovascular: No chest pain, palpitations Respiratory: No shortness of breath at rest or with exertion.   No wheezes GastrointestinaI: No nausea, vomiting, diarrhea, abdominal pain, fecal incontinence Genitourinary: No dysuria, urinary retention or frequency.  No nocturia. Musculoskeletal: No neck pain, back pain Integumentary: No rash, pruritus, skin lesions Neurological: as above Psychiatric: No depression at this time.  No anxiety Endocrine: No palpitations, diaphoresis, change in appetite, change in weigh or increased thirst Hematologic/Lymphatic: No anemia, purpura, petechiae. Allergic/Immunologic: No itchy/runny eyes, nasal congestion, recent allergic reactions, rashes  ALLERGIES: No Known Allergies  HOME MEDICATIONS:  Current Outpatient Medications:  .  cholecalciferol (VITAMIN D3) 25 MCG (1000 UNIT) tablet, Take 1,000 Units by mouth daily., Disp: , Rfl:  .  levETIRAcetam (KEPPRA) 500 MG tablet, Take 1 tablet (500 mg total) by mouth 2 (two) times daily., Disp: 60 tablet, Rfl: 2 .  ocrelizumab (OCREVUS) 300 MG/10ML injection, Inject 20 mLs (600 mg total) into the vein every 6 (six) months. 600 mg every 6 months. (Patient taking differently: Inject 600 mg into the vein every 6 (six) months.), Disp: 20 mL, Rfl: 1  PAST MEDICAL HISTORY: Past Medical History:  Diagnosis Date  . MS (multiple sclerosis) (HCC)   . Seizure (HCC)   . Status epilepticus (HCC) 07/12/2018    PAST SURGICAL HISTORY: Past Surgical History:  Procedure Laterality Date  . NO PAST SURGERIES      FAMILY HISTORY: Family History  Problem  Relation Age of Onset  . Hypertension Mother   . Healthy Father   . Healthy Sister   . Healthy Brother   . Diabetes Maternal Grandmother   . Hypertension Maternal Grandfather   . Hodgkin's lymphoma Brother   . Colon cancer Maternal Uncle 45    SOCIAL HISTORY:  Social History   Socioeconomic History  . Marital status: Unknown    Spouse name: Not on file  . Number of children: 0  . Years of education: 25  . Highest education level: Not on file  Occupational History  . Occupation: Unemployed  Tobacco Use  . Smoking status: Current Some Day Smoker    Types: Cigars  .  Smokeless tobacco: Never Used  . Tobacco comment: 2.5-3 per day  Vaping Use  . Vaping Use: Never used  Substance and Sexual Activity  . Alcohol use: Yes    Comment: sometimes   . Drug use: Yes    Types: Marijuana    Comment: Reports he smokes a lot, cannot put a number to it  . Sexual activity: Not on file  Other Topics Concern  . Not on file  Social History Narrative   Left handed   Coffee every day   Lives with family    Social Determinants of Health   Financial Resource Strain: Not on file  Food Insecurity: Not on file  Transportation Needs: Not on file  Physical Activity: Not on file  Stress: Not on file  Social Connections: Not on file  Intimate Partner Violence: Not on file     PHYSICAL EXAM  Vitals:   01/28/20 1106  BP: 121/76  Pulse: 72  Weight: 154 lb (69.9 kg)  Height: 5\' 8"  (1.727 m)    Body mass index is 23.42 kg/m.   General: The patient is well-developed and well-nourished and in no acute distress  HEENT:  Head is [place]/AT.  Sclera are anicteric.     Skin: Extremities are without rash or  edema.  Neurologic Exam  Mental status: The patient is alert and oriented x 3 at the time of the examination. The patient has apparent normal recent and remote memory, with an apparently normal attention span and concentration ability.   Speech is normal.  Cranial nerves: Extraocular  movements show right INO. Pupils are equal, round, and reactive to light and accomodation.  Visual fields are full.  Facial symmetry is present. There is good facial sensation to soft touch bilaterally.Facial strength is normal.  Trapezius and sternocleidomastoid strength is normal. No dysarthria is noted.  The tongue is midline, and the patient has symmetric elevation of the soft palate. No obvious hearing deficits are noted.  Motor:  Muscle bulk is normal.   Tone is increased in legs. Strength is  5 / 5 in all 4 extremities.   Sensory: Sensory testing is intact to pinprick, soft touch and vibration sensation in all 4 extremities.  Coordination: Cerebellar testing reveals reduced finger-nose-finger and heel-to-shin left worse than right.  Gait and station: Station is normal.   Gait is spastic with mild left foot dorp. Cannot tandem . Romberg is negative.   Reflexes: Deep tendon reflexes are increased in legs, left > right; nonsustained left ankle clonus.      DIAGNOSTIC DATA (LABS, IMAGING, TESTING) - I reviewed patient records, labs, notes, testing and imaging myself where available.  Lab Results  Component Value Date   WBC 13.5 (H) 12/05/2019   HGB 13.6 12/05/2019   HCT 42.4 12/05/2019   MCV 97.9 12/05/2019   PLT 235 12/05/2019      Component Value Date/Time   NA 140 12/05/2019 0804   NA 140 07/22/2019 1502   K 3.7 12/05/2019 0804   CL 108 12/05/2019 0804   CO2 19 (L) 12/05/2019 0804   GLUCOSE 85 12/05/2019 0804   BUN 9 12/05/2019 0804   BUN 12 07/22/2019 1502   CREATININE 1.16 12/05/2019 0804   CALCIUM 8.3 (L) 12/05/2019 0804   PROT 6.7 12/04/2019 0725   PROT 7.2 07/22/2019 1502   ALBUMIN 3.7 12/04/2019 0725   ALBUMIN 4.9 07/22/2019 1502   AST 38 12/04/2019 0725   ALT 18 12/04/2019 0725   ALKPHOS 55 12/04/2019 0725  BILITOT 0.6 12/04/2019 0725   BILITOT <0.2 07/22/2019 1502   GFRNONAA >60 12/05/2019 0804   GFRAA 99 07/22/2019 1502     Lab Results  Component  Value Date   TSH 0.625 01/22/2019       ASSESSMENT AND PLAN  Multiple sclerosis (HCC)  Seizure disorder (HCC)  Gait disturbance  Left-sided weakness  High risk medication use   1.  Continue Ocrevus.  IgG and IgM was fine when last tested. 2.  Continue Keppra 500 mg p.o. twice daily.  If another seizure occurs we will either increase the dose or switch to a different medicine.  No need for EEG as this is his 3rd seizure and he is back at baseline. 3.   Stay active and exercise as tolerated. 4.   Return to see [place] in 4 months or sooner for new or worsening neurologic symptoms.  45-minute office visit with the majority of the time spent face-to-face for history and physical, discussion/counseling and decision-making.  Additional time with record review and documentation.    Doria Fern A. Felecia Shelling, MD, [facility] 09/26/8180, 9:93 PM Certified in Neurology, Clinical Neurophysiology, Sleep Medicine and Neuroimaging  [facility] [address], [place], [place] [address] [phone]

## 2020-03-05 ENCOUNTER — Telehealth: Payer: Self-pay | Admitting: Neurology

## 2020-03-05 MED ORDER — LEVETIRACETAM 500 MG PO TABS
500.0000 mg | ORAL_TABLET | Freq: Two times a day (BID) | ORAL | 11 refills | Status: DC
Start: 2020-03-05 — End: 2021-02-04

## 2020-03-05 NOTE — Telephone Encounter (Signed)
E-scribed refill as requested. 

## 2020-03-05 NOTE — Telephone Encounter (Signed)
Pt is needing a refill on his levETIRAcetam (KEPPRA) 500 MG tablet sent in to the [facility] on [address]

## 2020-03-25 ENCOUNTER — Telehealth: Payer: Self-pay | Admitting: Neurology

## 2020-03-25 DIAGNOSIS — G35 Multiple sclerosis: Secondary | ICD-10-CM

## 2020-03-25 MED ORDER — OCREVUS 300 MG/10ML IV SOLN: 600.0000 mg | INTRAVENOUS | 1 refills | Status: DC

## 2020-03-25 NOTE — Telephone Encounter (Signed)
Angelique from [facility] called for a renewal for ocrelizumab (OCREVUS) 300 MG/10ML injection.  Best contact: [phone]

## 2020-03-25 NOTE — Telephone Encounter (Signed)
E-scribed refill as requested. 

## 2020-03-25 NOTE — Addendum Note (Signed)
Addended by: Arther Abbott on: 03/25/2020 10:41 AM   Modules accepted: Orders

## 2020-07-29 ENCOUNTER — Ambulatory Visit (INDEPENDENT_AMBULATORY_CARE_PROVIDER_SITE_OTHER): Payer: Medicare (Managed Care) | Admitting: Neurology

## 2020-07-29 ENCOUNTER — Encounter: Payer: Self-pay | Admitting: Neurology

## 2020-07-29 ENCOUNTER — Other Ambulatory Visit: Payer: Self-pay

## 2020-07-29 VITALS — BP 122/75 | HR 78 | Ht 68.0 in | Wt 148.0 lb

## 2020-07-29 DIAGNOSIS — R5383 Other fatigue: Secondary | ICD-10-CM

## 2020-07-29 DIAGNOSIS — G35 Multiple sclerosis: Secondary | ICD-10-CM

## 2020-07-29 DIAGNOSIS — H5121 Internuclear ophthalmoplegia, right eye: Secondary | ICD-10-CM

## 2020-07-29 DIAGNOSIS — Z8669 Personal history of other diseases of the nervous system and sense organs: Secondary | ICD-10-CM

## 2020-07-29 DIAGNOSIS — R269 Unspecified abnormalities of gait and mobility: Secondary | ICD-10-CM | POA: Diagnosis not present

## 2020-07-29 DIAGNOSIS — R531 Weakness: Secondary | ICD-10-CM | POA: Diagnosis not present

## 2020-07-29 DIAGNOSIS — Z79899 Other long term (current) drug therapy: Secondary | ICD-10-CM | POA: Diagnosis not present

## 2020-07-29 DIAGNOSIS — G40909 Epilepsy, unspecified, not intractable, without status epilepticus: Secondary | ICD-10-CM

## 2020-07-29 NOTE — Progress Notes (Signed)
[facility]  PATIENT: Miguel Black DOB: [birth year]  REFERRING DOCTOR OR PCP:  Miguel Black (PCP) SOURCE: Patient, notes from emergency room, lab results and imaging studies personally reviewed.  _________________________________   HISTORICAL  CHIEF COMPLAINT:  Chief Complaint  Patient presents with   Follow-up    RM 1, alone. Ambulates w/ cane. Last seen 01/28/20. On Ocrevus for MS. Last infusion: 04/20/20, next: 10/27/20. Receives at [facility] w/ intrafusion. On keppra for seizures. No new sx. Doing well.    HISTORY OF PRESENT ILLNESS:  He is a 30 y.o. man with multiple sclerosis and seizures.  Update 07/29/2020: He reports doing well since his last visit.  He reports no new seizures and feels his neurologic status is stable with no new symptoms or exacerbations.    His last seizure was seizure 12/03/2019.  Afterwards, he remained groggy and slept 11 hours.   He returned to his baseline over the next day.    He was not on any anti-epileptic medication at the time (Keppra was prescribed but he had s.e. and did not take it long term).  He is on Keppra now 500 mg po bid.   No new seizures since then.  He feels his MS is stable.     He is on Ocrevus, last dose 04/20/20.   Next one 10/27/2020.   He feels neurologically stable.   No exacerbations or new neurologic symptoms.    He uses a cane to ambulate.  He notes some slowing of the left hand and it feels heavier.  No recent falls.  He is weaker on his left side than right.  Bladder function is fine.   He notes vision is reduced on the left and he has color desaturation out of the left eye.   His right eye has good vision.      He notes fatigue some afternoons but is good in the mornings.   Fatigue is physical > cognitive.   He sleeps well.    He feels focus and attention are good though cognitive skills sometimes are a little off.   He feels mood is fine.        MS history:  He was diagnosed with relapsing remitting MS  in June 2015 after presenting with reduced vision OS while he was in [facility] [place].   In retrospect he had had some neurologic symptoms a few years before the disgnosis.   He had trouble running before his diagnosis.    He was placed on Tecfidera but he had breakthrough activity.     Just a year later, he was switched to Tysabri and has done better.  He moved to [facility]  June 2019 and started seeing the MS Center at [facility] (Dr. Gaynelle Black).   He has not had any exacerbations and feels his MS has been stable.   Initially, he continue Tysabri.  He tolerated Tysabri well but going back and forth to an infusion center monthly with difficulty.    He has been JCV Ab negative.   His last infusion was off Tysabri was in summer 2019 and he switched to Ocrevus at the end of 2019.   His last JCV Ab test I have a result for is 07/04/2017 (negative 0.14).   I saw him 12/2018 and he transferred infusions to [facility].      Seizure history:  His first one was 2018 and second one was 07/12/2018.   He notes being dehydrated the second one.  Both seizures were GTC by description with LOC.    He slept > a day after each one.   He was started on Keppra but stopped after a week or so due to side effects.  He had a third seizure November 2021 when he was found by his dad in a postictal state.  He was placed on Keppra has continued to take it.   Imaging MRI of the brain 07/13/2018 showed multiple T2/flair hyperintense foci.  Posterior fossa foci are noted in the posterior right medulla, bilateral cerebellar hemispheres, pons, right cerebellar peduncle and cerebral peduncle.  There are foci in the thalamus and multiple foci in the periventricular, juxtacortical and deep white matter.  None of the foci appear to be acute or enhance.   Lab tests: Hepatitis B labs from 2020 at [facility] showed no evidence of chronic infection.  :  TB Gold was negative 01/22/2019.  Vitamin D 01/22/2019 was 30.1.  IgG/IgA/IgM was normal 01/22/2019.   CD19/CD20 showed less then 0.5% of lymphocytes were B cells.    REVIEW OF SYSTEMS: Constitutional: No fevers, chills, sweats, or change in appetite Eyes: He notes reduced vision OS.  He sometimes has double vision. Ear, nose and throat: No hearing loss, ear pain, nasal congestion, sore throat Cardiovascular: No chest pain, palpitations Respiratory:  No shortness of breath at rest or with exertion.   No wheezes GastrointestinaI: No nausea, vomiting, diarrhea, abdominal pain, fecal incontinence Genitourinary:  No dysuria, urinary retention or frequency.  No nocturia. Musculoskeletal:  No neck pain, back pain Integumentary: No rash, pruritus, skin lesions Neurological: as above Psychiatric: No depression at this time.  No anxiety Endocrine: No palpitations, diaphoresis, change in appetite, change in weigh or increased thirst Hematologic/Lymphatic:  No anemia, purpura, petechiae. Allergic/Immunologic: No itchy/runny eyes, nasal congestion, recent allergic reactions, rashes  ALLERGIES: No Known Allergies  HOME MEDICATIONS:  Current Outpatient Medications:    cholecalciferol (VITAMIN D3) 25 MCG (1000 UNIT) tablet, Take 1,000 Units by mouth daily., Disp: , Rfl:    levETIRAcetam (KEPPRA) 500 MG tablet, Take 1 tablet (500 mg total) by mouth 2 (two) times daily., Disp: 60 tablet, Rfl: 11   ocrelizumab (OCREVUS) 300 MG/10ML injection, Inject 20 mLs (600 mg total) into the vein every 6 (six) months. 600 mg every 6 months., Disp: 20 mL, Rfl: 1  PAST MEDICAL HISTORY: Past Medical History:  Diagnosis Date   MS (multiple sclerosis) (HCC)    Seizure (HCC)    Status epilepticus (HCC) 07/12/2018    PAST SURGICAL HISTORY: Past Surgical History:  Procedure Laterality Date   NO PAST SURGERIES      FAMILY HISTORY: Family History  Problem Relation Age of Onset   Hypertension Mother    Healthy Father    Healthy Sister    Healthy Brother    Diabetes Maternal Grandmother    Hypertension  Maternal Grandfather    Hodgkin's lymphoma Brother    Colon cancer Maternal Uncle 45    SOCIAL HISTORY:  Social History   Socioeconomic History   Marital status: Unknown    Spouse name: Not on file   Number of children: 0   Years of education: 11   Highest education level: Not on file  Occupational History   Occupation: Unemployed  Tobacco Use   Smoking status: Some Days    Pack years: 0.00    Types: Cigars   Smokeless tobacco: Never   Tobacco comments:    2.5-3 per day  Vaping Use   Vaping  Use: Never used  Substance and Sexual Activity   Alcohol use: Yes    Comment: sometimes    Drug use: Yes    Types: Marijuana    Comment: Reports he smokes a lot, cannot put a number to it   Sexual activity: Not on file  Other Topics Concern   Not on file  Social History Narrative   Left handed   Coffee every day   Lives with family    Social Determinants of Health   Financial Resource Strain: Not on file  Food Insecurity: Not on file  Transportation Needs: Not on file  Physical Activity: Not on file  Stress: Not on file  Social Connections: Not on file  Intimate Partner Violence: Not on file     PHYSICAL EXAM  Vitals:   07/29/20 1118  BP: 122/75  Pulse: 78  Weight: 148 lb (67.1 kg)  Height: 5\' 8"  (1.727 m)    Body mass index is 22.5 kg/m.   General: The patient is well-developed and well-nourished and in no acute distress  HEENT:  Head is [place]/AT.  Sclera are anicteric.     Skin: Extremities are without rash or edema.  Neurologic Exam  Mental status: The patient is alert and oriented x 3 at the time of the examination. The patient has apparent normal recent and remote memory, with an apparently normal attention span and concentration ability.   Speech is normal.  Cranial nerves: Extraocular movements show right INO. Pupils are equal, round, and reactive to light and accomodation.  Mild reduced acuity and color saturation OS vs OD.  Facial symmetry is  present. There is good facial sensation to soft touch bilaterally.Facial strength is normal.  Trapezius and sternocleidomastoid strength is normal. No dysarthria is noted.   No obvious hearing deficits are noted.  Motor:  Muscle bulk is normal.   Tone is increased in legs. Strength is  5 / 5 in all 4 extremities.   Sensory: Sensory testing is intact to pinprick, soft touch and vibration sensation in all 4 extremities.  Coordination: Cerebellar testing reveals reduced finger-nose-finger and heel-to-shin lon he left.  Gait and station: Station is normal.   Gait is spastic with mild left foot drop. He is unable to tandem.  Romberg is negative.   Reflexes: Deep tendon reflexes are increased in legs, left > right; crossed adductors (L>R) and nonsustained left ankle clonus.      DIAGNOSTIC DATA (LABS, IMAGING, TESTING) - I reviewed patient records, labs, notes, testing and imaging myself where available.  Lab Results  Component Value Date   WBC 13.5 (H) 12/05/2019   HGB 13.6 12/05/2019   HCT 42.4 12/05/2019   MCV 97.9 12/05/2019   PLT 235 12/05/2019      Component Value Date/Time   NA 140 12/05/2019 0804   NA 140 07/22/2019 1502   K 3.7 12/05/2019 0804   CL 108 12/05/2019 0804   CO2 19 (L) 12/05/2019 0804   GLUCOSE 85 12/05/2019 0804   BUN 9 12/05/2019 0804   BUN 12 07/22/2019 1502   CREATININE 1.16 12/05/2019 0804   CALCIUM 8.3 (L) 12/05/2019 0804   PROT 6.7 12/04/2019 0725   PROT 7.2 07/22/2019 1502   ALBUMIN 3.7 12/04/2019 0725   ALBUMIN 4.9 07/22/2019 1502   AST 38 12/04/2019 0725   ALT 18 12/04/2019 0725   ALKPHOS 55 12/04/2019 0725   BILITOT 0.6 12/04/2019 0725   BILITOT <0.2 07/22/2019 1502   GFRNONAA >60 12/05/2019 0804  GFRAA 99 07/22/2019 1502     Lab Results  Component Value Date   TSH 0.625 01/22/2019       ASSESSMENT AND PLAN  Multiple sclerosis (HCC) - Plan: Ambulatory referral to Physical Therapy, IgG, IgA, IgM, CBC with Differential/Platelet, MR  BRAIN W WO CONTRAST, MR CERVICAL SPINE W WO CONTRAST  Gait disturbance - Plan: Ambulatory referral to Physical Therapy, MR BRAIN W WO CONTRAST, MR CERVICAL SPINE W WO CONTRAST  High risk medication use - Plan: IgG, IgA, IgM, CBC with Differential/Platelet  Left-sided weakness  Seizure disorder (HCC)  Other fatigue  History of optic neuritis  Internuclear ophthalmoplegia of right eye   1.  With MS, he will continue Ocrevus.  We will check IgM/IgG/IgA and CBC with differential today.  Check MRI of the brain and cervical spine to determine if there is any breakthrough activity.  If this is occurring we would need to consider a different disease modifying therapy 2.  Continue Keppra 500 mg p.o. twice daily.  If another seizure occurs we will either increase the dose or switch to a different medicine.   3.   We will continue to use the cane.  We did discuss the possibility of using a walker for more stability.  We will do some physical therapy to work on gait and balance.  Stay active, exercise as tolerated. 4.   Return to see [place] in 4 months or sooner for new or worsening neurologic symptoms.  40-minute office visit with the majority of the time spent face-to-face for history and physical, discussion/counseling and decision-making.  Additional time with record review and documentation.    Star Cheese A. Epimenio Foot, MD, [facility] 07/29/2020, 11:52 AM Certified in Neurology, Clinical Neurophysiology, Sleep Medicine and Neuroimaging  [facility] [address] [place], [place] [address] [phone]

## 2020-07-30 ENCOUNTER — Telehealth: Payer: Self-pay | Admitting: Neurology

## 2020-07-30 LAB — CBC WITH DIFFERENTIAL/PLATELET
Basophils Absolute: 0 10*3/uL (ref 0.0–0.2)
Basos: 1 %
EOS (ABSOLUTE): 0.1 10*3/uL (ref 0.0–0.4)
Eos: 1 %
Hematocrit: 48.8 % (ref 37.5–51.0)
Hemoglobin: 16.5 g/dL (ref 13.0–17.7)
Immature Grans (Abs): 0 10*3/uL (ref 0.0–0.1)
Immature Granulocytes: 0 %
Lymphocytes Absolute: 1.8 10*3/uL (ref 0.7–3.1)
Lymphs: 34 %
MCH: 32.3 pg (ref 26.6–33.0)
MCHC: 33.8 g/dL (ref 31.5–35.7)
MCV: 96 fL (ref 79–97)
Monocytes Absolute: 0.5 10*3/uL (ref 0.1–0.9)
Monocytes: 10 %
Neutrophils Absolute: 2.9 10*3/uL (ref 1.4–7.0)
Neutrophils: 54 %
Platelets: 245 10*3/uL (ref 150–450)
RBC: 5.11 x10E6/uL (ref 4.14–5.80)
RDW: 13.1 % (ref 11.6–15.4)
WBC: 5.4 10*3/uL (ref 3.4–10.8)

## 2020-07-30 LAB — IGG, IGA, IGM
IgA/Immunoglobulin A, Serum: 151 mg/dL (ref 90–386)
IgG (Immunoglobin G), Serum: 1330 mg/dL (ref 603–1613)
IgM (Immunoglobulin M), Srm: 27 mg/dL (ref 20–172)

## 2020-07-30 NOTE — Telephone Encounter (Signed)
Called the pt and reviewed the lab results with the pt. Advised the labs were normal and Dr Epimenio Foot didn't have any concerns. Pt verbalized understanding.

## 2020-07-30 NOTE — Telephone Encounter (Signed)
-----   Message from Asa Lente, MD sent at 07/30/2020  1:05 PM EDT ----- Please let the patient know that the lab work is fine.

## 2020-08-03 ENCOUNTER — Encounter: Payer: Self-pay | Admitting: Physical Therapy

## 2020-08-03 ENCOUNTER — Other Ambulatory Visit: Payer: Self-pay

## 2020-08-03 ENCOUNTER — Ambulatory Visit: Payer: Medicare (Managed Care) | Attending: Neurology | Admitting: Physical Therapy

## 2020-08-03 DIAGNOSIS — R2681 Unsteadiness on feet: Secondary | ICD-10-CM | POA: Insufficient documentation

## 2020-08-03 DIAGNOSIS — M6281 Muscle weakness (generalized): Secondary | ICD-10-CM | POA: Diagnosis present

## 2020-08-03 DIAGNOSIS — R2689 Other abnormalities of gait and mobility: Secondary | ICD-10-CM | POA: Insufficient documentation

## 2020-08-03 NOTE — Therapy (Signed)
[facility] [address] [place], [place], [address] Phone: [phone]   Fax:  [phone]  Physical Therapy Evaluation  Patient Details  Name: Miguel Black MRN: [id number] Date of Birth: 23-May-1990 Referring Provider (PT): Despina Arias, MD   Encounter Date: 08/03/2020   PT End of Session - 08/03/20 1631     Visit Number 1    Number of Visits 18    Date for PT Re-Evaluation 10/02/20    Authorization Type On chart:  [facility] Medicare/Medicaid; front office verifying at time of eval; PT unsure at completion of eval    PT Start Time 1232    PT Stop Time 1314    PT Time Calculation (min) 42 min    Equipment Utilized During Treatment Gait belt    Activity Tolerance Patient tolerated treatment well    Behavior During Therapy WFL for tasks assessed/performed             Past Medical History:  Diagnosis Date   MS (multiple sclerosis) (HCC)    Seizure (HCC)    Status epilepticus (HCC) 07/12/2018    Past Surgical History:  Procedure Laterality Date   NO PAST SURGERIES      There were no vitals filed for this visit.    Subjective Assessment - 08/03/20 1238     Subjective Pt reports just seeing Dr. Epimenio Foot, and he wants to have PT to work on strengthening LLE, due to MS.  Pt has been using the cane for at least 5 years.  No falls.  Pt describes that "L leg just drops down" (no heelstrike with gait).    Pertinent History dx of MS June 2015 (relapsing/remitting), seizure hx    Patient Stated Goals Pt's goals for therapy are to strengthen L side and get rid of using the cane.    Currently in Pain? No/denies                [facility] PT Assessment - 08/03/20 1240       Assessment   Medical Diagnosis Multiple sclerosis    Referring Provider (PT) Despina Arias, MD    Onset Date/Surgical Date 07/29/20   MD visit   Hand Dominance Left    Prior Therapy > 3 years ago in [place]      Precautions    Precautions Fall    Precaution Comments Hx of seizures, no driving      Balance Screen   Has the patient fallen in the past 6 months No    Has the patient had a decrease in activity level because of a fear of falling?  Yes    Is the patient reluctant to leave their home because of a fear of falling?  Yes      Home Environment   Living Environment Private residence    Living Arrangements Parent    Type of Home House    Home Access Stairs to enter    Entrance Stairs-Number of Steps 7    Entrance Stairs-Rails Right    Home Layout Two level;Bed/bath upstairs    Alternate Level Stairs-Number of Steps 13    Alternate Level Stairs-Rails Right    Home Equipment [place] - single point      Prior Function   Level of Independence Independent    Vocation On disability    Leisure Sedentary; has some weights that he uses at home      Observation/Other Assessments   Focus on Therapeutic Outcomes (FOTO)  NA  Sensation   Light Touch Appears Intact      Posture/Postural Control   Posture/Postural Control Postural limitations    Postural Limitations Rounded Shoulders      ROM / Strength   AROM / PROM / Strength AROM;Strength      AROM   Overall AROM  Deficits    Overall AROM Comments BLES grossly tested and WFL, except L ankle dorsiflexion AROM limited to 10 degrees.      Strength   Overall Strength Deficits    Strength Assessment Site Hip;Knee;Ankle    Right/Left Hip Right;Left    Right Hip Flexion 5/5    Left Hip Flexion 4/5    Right/Left Knee Right;Left    Right Knee Flexion 5/5    Right Knee Extension 5/5    Left Knee Flexion 4/5    Left Knee Extension 4/5    Right/Left Ankle Right;Left    Right Ankle Dorsiflexion 4/5    Left Ankle Dorsiflexion 3-/5    Left Ankle Plantar Flexion 3-/5      Transfers   Transfers Sit to Stand;Stand to Sit    Sit to Stand 4: Min guard;Without upper extremity assist;From chair/3-in-1    Five time sit to stand comments  22.69    Stand to Sit  4: Min guard;Without upper extremity assist;To chair/3-in-1;Uncontrolled descent      Ambulation/Gait   Ambulation/Gait Yes    Ambulation/Gait Assistance 5: Supervision    Ambulation Distance (Feet) 80 Feet    Assistive device Straight cane   rounded quad tip base   Gait Pattern Step-through pattern;Decreased step length - left;Decreased hip/knee flexion - left;Decreased dorsiflexion - left;Ataxic;Poor foot clearance - left    Ambulation Surface Level;Indoor    Gait velocity 18 sec= 1.82 ft/sec      Standardized Balance Assessment   Standardized Balance Assessment Timed Up and Go Test;Berg Balance Test      Berg Balance Test   Sit to Stand Able to stand without using hands and stabilize independently    Standing Unsupported Able to stand 2 minutes with supervision    Sitting with Back Unsupported but Feet Supported on Floor or Stool Able to sit safely and securely 2 minutes    Stand to Sit Sits independently, has uncontrolled descent    Transfers Able to transfer with verbal cueing and /or supervision    Standing Unsupported with Eyes Closed Able to stand 10 seconds with supervision    Standing Unsupported with Feet Together Able to place feet together independently but unable to hold for 30 seconds    From Standing, Reach Forward with Outstretched Arm Reaches forward but needs supervision    From Standing Position, Pick up Object from Floor Able to pick up shoe, needs supervision    From Standing Position, Turn to Look Behind Over each Shoulder Looks behind one side only/other side shows less weight shift    Turn 360 Degrees Needs assistance while turning    Standing Unsupported, Alternately Place Feet on Step/Stool Needs assistance to keep from falling or unable to try    Standing Unsupported, One Foot in Front Needs help to step but can hold 15 seconds    Standing on One Leg Unable to try or needs assist to prevent fall    Total Score 27    Berg comment: Scores <45/56 indicate  increased fall risk      Timed Up and Go Test   TUG Normal TUG    Normal TUG (seconds) 17.06  TUG Comments Scores >13.5 sec indicates increased fall risk.                        Objective measurements completed on examination: See above findings.               PT Education - 08/03/20 1631     Education Details PT POC and eval results    Person(s) Educated Patient    Methods Explanation    Comprehension Verbalized understanding              PT Short Term Goals - 08/03/20 1641       PT SHORT TERM GOAL #1   Title Pt will be independent with HEP for improved strength, balance, transfers, and gait.  TARGET 08/28/2020    Baseline No current HEP    Time 4    Period Weeks    Status New      PT SHORT TERM GOAL #2   Title Pt will improve 5x sit<>stand to less than or equal to 18 seconds to demonstrate improved functional strength and transfer efficiency.    Baseline 22.69 sec at eval    Time 4    Period Weeks    Status New      PT SHORT TERM GOAL #3   Title Pt will verbalize understanding of fall prevention in home environment.    Baseline fall risk per Berg, TUG, gait velocity    Time 4    Period Weeks    Status New      PT SHORT TERM GOAL #4   Title Pt will improve SLS to at least 2 seconds each leg for improved stability for balance, gait, stairs.    Baseline unable    Time 4    Period Weeks    Status New               PT Long Term Goals - 08/03/20 1647       PT LONG TERM GOAL #1   Title Pt will be independent with progression of HEP for improved strength, balance, transfers, and gait.  TARGET 10/02/2020    Baseline no current HEP    Time 9    Period Weeks    Status New      PT LONG TERM GOAL #2   Title Pt will improve 5x sit<>stand to less than or equal to 15 seconds to demonstrate improved functional strength and transfer efficiency.    Baseline 22.69 at eval    Time 9    Period Weeks    Status New      PT LONG TERM  GOAL #3   Title Pt will improve Berg score to at least 35/56 to decrease fall risk.    Baseline 27/56 at eval    Time 9    Period Weeks    Status New      PT LONG TERM GOAL #4   Title Pt will improve gait velocity to at least 2.2 ft/sec for improved gait efficiency and safety.    Baseline 1.82 ft/sec    Time 9    Period Weeks    Status New      PT LONG TERM GOAL #5   Title Pt will verbalize plans for ongoing community fitness upon d/c from PT.    Baseline no current community fitness plan    Time 9    Period Weeks    Status New  Plan - 08/03/20 1634     Clinical Impression Statement Pt is a 30 year old male with history of MS, who presents to OPPT for strengthening of LLE and improved gait.  Pt reports at least 7 year history of MS and has difficulty with gait and transfers due to LLE weakness.  He presents with decreased LLE strength, abnormal tone/posture with ataxia, decreased balance, abnormality of gait.  He is at increased risk of falls per TUG, BERG, and gait velocity scores.  He demo decreased functional strength with repeated sit to stand.  He may benefit from orthotic consult to assist with stability and timing/coordination of gait.  He would benefit from skilled PT to further address the above stated deficits to decrease fall risk and improve overall functional mobility and independence.    Personal Factors and Comorbidities Comorbidity 2    Comorbidities MS June 2015 (relapsing/remitting), seizure hx    Examination-Activity Limitations Locomotion Level;Transfers;Stairs;Stand    Examination-Participation Restrictions Community Activity;Other   General fitnes   Stability/Clinical Decision Making Stable/Uncomplicated    Clinical Decision Making Low    Rehab Potential Good    PT Frequency 2x / week    PT Duration Other (comment)   9 weeks, including eval week   PT Treatment/Interventions ADLs/Self Care Home Management;Gait training;Stair  training;Functional mobility training;Therapeutic activities;Therapeutic exercise;Balance training;Neuromuscular re-education;DME Instruction;Manual techniques;Orthotic Fit/Training;Patient/family education;Energy conservation    PT Next Visit Plan Initiate HEP:  tall kneeling/quadruped, LLE and trunk strengthening, sit<>stand and squats with bias for incr. LLE weightbearing.  At some point, trial AFOs with potential orthotic consult    Consulted and Agree with Plan of Care Patient             Patient will benefit from skilled therapeutic intervention in order to improve the following deficits and impairments:  Abnormal gait, Decreased coordination, Difficulty walking, Impaired tone, Decreased balance, Postural dysfunction, Decreased strength, Decreased mobility  Visit Diagnosis: Other abnormalities of gait and mobility  Unsteadiness on feet  Muscle weakness (generalized)     Problem List Patient Active Problem List   Diagnosis Date Noted   History of optic neuritis 07/29/2020   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 08/03/2020, 4:51 PM Gean Maidens., PT   [facility] [address] [place], [place], [address] Phone: [phone]   Fax:  [phone]  Name: TONG PIECZYNSKI MRN: [id number] Date of Birth: January 19, 1991

## 2020-08-06 ENCOUNTER — Ambulatory Visit: Payer: Medicare (Managed Care) | Admitting: Physical Therapy

## 2020-08-06 ENCOUNTER — Telehealth: Payer: Self-pay | Admitting: Neurology

## 2020-08-06 NOTE — Telephone Encounter (Signed)
I called patient's insurance, [employer], at [phone] to obtain auth for MRI brain (91505) and MRI cervical spine ([id number]). I spoke with Amber in the PA department. She transferred me to [facility] [phone]) and I spoke with [facility]. They will need clinicals faxed to [phone]. Tracking #[id number].

## 2020-08-11 ENCOUNTER — Ambulatory Visit: Payer: Medicare (Managed Care) | Admitting: Physical Therapy

## 2020-08-11 ENCOUNTER — Other Ambulatory Visit: Payer: Self-pay

## 2020-08-11 DIAGNOSIS — R2689 Other abnormalities of gait and mobility: Secondary | ICD-10-CM | POA: Diagnosis not present

## 2020-08-11 DIAGNOSIS — M6281 Muscle weakness (generalized): Secondary | ICD-10-CM

## 2020-08-11 DIAGNOSIS — R2681 Unsteadiness on feet: Secondary | ICD-10-CM

## 2020-08-11 NOTE — Patient Instructions (Signed)
Access Code: [email or web address] URL: [email or web address] Date: 08/11/2020 Prepared by: Sallyanne Kuster  Exercises Supine Bridge - 1 x daily - 5 x weekly - 1 sets - 10 reps Single Leg Bridge - 1 x daily - 5 x weekly - 1 sets - 10 reps Hooklying Clamshell with Resistance - 1 x daily - 5 x weekly - 1 sets - 10 reps Supine March with Resistance Band - 1 x daily - 5 x weekly - 1 sets - 10 reps Sit to/from Stand in Stride position - 1 x daily - 5 x weekly - 1 sets - 10 reps Heel Toe Raises with Counter Support - 1 x daily - 5 x weekly - 1 sets - 10 reps Squat with Chair and Counter Support - 1 x daily - 5 x weekly - 1 sets - 10 reps

## 2020-08-11 NOTE — Therapy (Signed)
[facility] [address] [place], [place], [address] Phone: [phone]   Fax:  [phone]  Physical Therapy Treatment  Patient Details  Name: Miguel Black MRN: [id number] Date of Birth: 1990-07-11 Referring Provider (PT): Despina Arias, MD   Encounter Date: 08/11/2020   PT End of Session - 08/11/20 1105     Visit Number 2    Number of Visits 18    Date for PT Re-Evaluation 10/02/20    Authorization Type On chart:  [facility] Medicare/Medicaid; front office verifying at time of eval; PT unsure at completion of eval    PT Start Time 1102    PT Stop Time 1144    PT Time Calculation (min) 42 min    Equipment Utilized During Treatment Gait belt    Activity Tolerance Patient tolerated treatment well    Behavior During Therapy WFL for tasks assessed/performed             Past Medical History:  Diagnosis Date   MS (multiple sclerosis) (HCC)    Seizure (HCC)    Status epilepticus (HCC) 07/12/2018    Past Surgical History:  Procedure Laterality Date   NO PAST SURGERIES      There were no vitals filed for this visit.   Subjective Assessment - 08/11/20 1104     Subjective No new complaints. No falls or pain to report. Does endorse he is making more of a consious effort to weight left LE with gait.    Pertinent History dx of MS June 2015 (relapsing/remitting), seizure hx    Patient Stated Goals Pt's goals for therapy are to strengthen L side and get rid of using the cane.    Currently in Pain? No/denies                     [facility] Adult PT Treatment/Exercise - 08/11/20 1109       Transfers   Transfers Sit to Stand;Stand to Sit    Sit to Stand 4: Min guard;With upper extremity assist;Without upper extremity assist;From bed;From chair/3-in-1    Stand to Sit 4: Min guard;With upper extremity assist;Without upper extremity assist;To bed;To chair/3-in-1      Ambulation/Gait   Ambulation/Gait Yes     Ambulation/Gait Assistance 5: Supervision;4: Min guard    Ambulation/Gait Assistance Details around clinic with session. unsteadiness noted at end of session with decreased weight shifting onto left LE and pt needing to focus on swing phase with gait to clear left LE at end of session, min guard assist for safety.    Assistive device Straight cane   rounded tip base   Gait Pattern Step-through pattern;Decreased step length - left;Decreased hip/knee flexion - left;Decreased dorsiflexion - left;Ataxic;Poor foot clearance - left    Ambulation Surface Level;Indoor      Neuro Re-ed    Neuro Re-ed Details  for strengthening/NMR: tall kneeling with UE's on Kaye bench- mini squats for 10 reps, then alternating moving LE out/in for 10 reps on each side. min guard to min assist for balance with cues on posture and weight shifting.      Exercises   Exercises Other Exercises    Other Exercises  issued HEP for core/LE strengtening today. Refer to [employer] for full details. Cues needed for correct form and technique with ex's performed in session today.            issued to HEP this session:  Access Code: [email or web address] URL: [email or web address] Date: 08/11/2020 Prepared by:  Sallyanne Kuster  Exercises Supine Bridge - 1 x daily - 5 x weekly - 1 sets - 10 reps Single Leg Bridge - 1 x daily - 5 x weekly - 1 sets - 10 reps Hooklying Clamshell with Resistance - 1 x daily - 5 x weekly - 1 sets - 10 reps Supine March with Resistance Band - 1 x daily - 5 x weekly - 1 sets - 10 reps Sit to/from Stand in Stride position - 1 x daily - 5 x weekly - 1 sets - 10 reps Heel Toe Raises with Counter Support - 1 x daily - 5 x weekly - 1 sets - 10 reps Squat with Chair and Counter Support - 1 x daily - 5 x weekly - 1 sets - 10 reps       PT Education - 08/11/20 1509     Education Details initial HEP for strengthening    Person(s) Educated Patient    Methods Explanation;Demonstration;Verbal  cues;Handout    Comprehension Verbalized understanding;Returned demonstration;Verbal cues required;Need further instruction              PT Short Term Goals - 08/03/20 1641       PT SHORT TERM GOAL #1   Title Pt will be independent with HEP for improved strength, balance, transfers, and gait.  TARGET 08/28/2020    Baseline No current HEP    Time 4    Period Weeks    Status New      PT SHORT TERM GOAL #2   Title Pt will improve 5x sit<>stand to less than or equal to 18 seconds to demonstrate improved functional strength and transfer efficiency.    Baseline 22.69 sec at eval    Time 4    Period Weeks    Status New      PT SHORT TERM GOAL #3   Title Pt will verbalize understanding of fall prevention in home environment.    Baseline fall risk per Berg, TUG, gait velocity    Time 4    Period Weeks    Status New      PT SHORT TERM GOAL #4   Title Pt will improve SLS to at least 2 seconds each leg for improved stability for balance, gait, stairs.    Baseline unable    Time 4    Period Weeks    Status New               PT Long Term Goals - 08/03/20 1647       PT LONG TERM GOAL #1   Title Pt will be independent with progression of HEP for improved strength, balance, transfers, and gait.  TARGET 10/02/2020    Baseline no current HEP    Time 9    Period Weeks    Status New      PT LONG TERM GOAL #2   Title Pt will improve 5x sit<>stand to less than or equal to 15 seconds to demonstrate improved functional strength and transfer efficiency.    Baseline 22.69 at eval    Time 9    Period Weeks    Status New      PT LONG TERM GOAL #3   Title Pt will improve Berg score to at least 35/56 to decrease fall risk.    Baseline 27/56 at eval    Time 9    Period Weeks    Status New      PT LONG TERM GOAL #4   Title  Pt will improve gait velocity to at least 2.2 ft/sec for improved gait efficiency and safety.    Baseline 1.82 ft/sec    Time 9    Period Weeks    Status New       PT LONG TERM GOAL #5   Title Pt will verbalize plans for ongoing community fitness upon d/c from PT.    Baseline no current community fitness plan    Time 9    Period Weeks    Status New                   Plan - 08/11/20 1105     Clinical Impression Statement Today's skilled session initially focused on establishment of an HEP for strengthening with rest breaks taken as needed with performance in session. Remainder of session continued to work on strengthening/NMR in tall kneeling with pt fatiguing quickly with ex's in this position. The pt is progressing toward goals and should benefit from continued PT to progress toward unmet goals.    Personal Factors and Comorbidities Comorbidity 2    Comorbidities MS June 2015 (relapsing/remitting), seizure hx    Examination-Activity Limitations Locomotion Level;Transfers;Stairs;Stand    Examination-Participation Restrictions Community Activity;Other   General fitnes   Stability/Clinical Decision Making Stable/Uncomplicated    Rehab Potential Good    PT Frequency 2x / week    PT Duration Other (comment)   9 weeks, including eval week   PT Treatment/Interventions ADLs/Self Care Home Management;Gait training;Stair training;Functional mobility training;Therapeutic activities;Therapeutic exercise;Balance training;Neuromuscular re-education;DME Instruction;Manual techniques;Orthotic Fit/Training;Patient/family education;Energy conservation    PT Next Visit Plan How was HEP? continue with tall kneeling/quadruped with emphasis on LLE weightbearing.  At some point, trial AFOs with potential orthotic consult    Consulted and Agree with Plan of Care Patient             Patient will benefit from skilled therapeutic intervention in order to improve the following deficits and impairments:  Abnormal gait, Decreased coordination, Difficulty walking, Impaired tone, Decreased balance, Postural dysfunction, Decreased strength, Decreased  mobility  Visit Diagnosis: Other abnormalities of gait and mobility  Unsteadiness on feet  Muscle weakness (generalized)     Problem List Patient Active Problem List   Diagnosis Date Noted   History of optic neuritis 07/29/2020   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

## 2020-08-13 ENCOUNTER — Other Ambulatory Visit: Payer: Self-pay

## 2020-08-13 ENCOUNTER — Encounter: Payer: Self-pay | Admitting: Physical Therapy

## 2020-08-13 ENCOUNTER — Ambulatory Visit: Payer: Medicare (Managed Care) | Admitting: Physical Therapy

## 2020-08-13 DIAGNOSIS — M6281 Muscle weakness (generalized): Secondary | ICD-10-CM

## 2020-08-13 DIAGNOSIS — R2689 Other abnormalities of gait and mobility: Secondary | ICD-10-CM

## 2020-08-13 DIAGNOSIS — R2681 Unsteadiness on feet: Secondary | ICD-10-CM

## 2020-08-13 NOTE — Therapy (Signed)
[facility] [address] [place], [place], [address] Phone: [phone]   Fax:  [phone]  Physical Therapy Treatment  Patient Details  Name: Miguel Black MRN: [id number] Date of Birth: Feb 01, 1990 Referring Provider (PT): Despina Arias, MD   Encounter Date: 08/13/2020   PT End of Session - 08/13/20 1525     Visit Number 3    Number of Visits 18    Date for PT Re-Evaluation 10/02/20    Authorization Type On chart:  [employer] Medicare/Medicaid    PT Start Time 1238   Pt arrives late   PT Stop Time 1316    PT Time Calculation (min) 38 min    Equipment Utilized During Treatment Gait belt    Activity Tolerance Patient tolerated treatment well    Behavior During Therapy WFL for tasks assessed/performed             Past Medical History:  Diagnosis Date   MS (multiple sclerosis) (HCC)    Seizure (HCC)    Status epilepticus (HCC) 07/12/2018    Past Surgical History:  Procedure Laterality Date   NO PAST SURGERIES      There were no vitals filed for this visit.   Subjective Assessment - 08/13/20 1241     Subjective Felt the exercises, having a little muscle soreness.    Pertinent History dx of MS June 2015 (relapsing/remitting), seizure hx    Patient Stated Goals Pt's goals for therapy are to strengthen L side and get rid of using the cane.    Currently in Pain? No/denies                               [facility] Adult PT Treatment/Exercise - 08/13/20 0001       Exercises   Exercises Knee/Hip      Knee/Hip Exercises: Stretches   Active Hamstring Stretch Right;Left;3 reps;20 seconds    Active Hamstring Stretch Limitations Foot propped on floor, cues for technique    Gastroc Stretch Right;Left;3 reps;20 seconds    Gastroc Stretch Limitations using gait belt to assist      Knee/Hip Exercises: Seated   Long Arc Quad Strengthening;Right;Left;1 set;10 reps;Limitations    Long Arc Quad  Limitations used green theraband resistance    Hamstring Curl Strengthening;Right;Left;1 set;10 reps    Hamstring Limitations Green theraband, more difficulty with L than R      Knee/Hip Exercises: Supine   Hip Adduction Isometric Strengthening;Both;1 set;10 reps    Hip Adduction Isometric Limitations cues for control/technique    Other Supine Knee/Hip Exercises Single leg clamshell, with green theraband resistance; 5 reps each side.  Tactile cues provided for LLE stability.            Access Code: [email or web address] URL: [email or web address] Date: 08/11/2020 Prepared by: Sallyanne Kuster   Exercises-Reviewed HEP given last visit, with pt return demo understanding (performed 5 reps of most exercises today)  Supine Bridge - 1 x daily - 5 x weekly - 1 sets - 10 reps Single Leg Bridge - 1 x daily - 5 x weekly - 1 sets - 10 reps Hooklying Clamshell with Resistance - 1 x daily - 5 x weekly - 1 sets - 10 reps Supine March with Resistance Band - 1 x daily - 5 x weekly - 1 sets - 10 reps Sit to/from Stand in Stride position - 1 x daily - 5 x weekly - 1 sets - 10  reps Heel Toe Raises with Counter Support - 1 x daily - 5 x weekly - 1 sets - 10 reps Squat with Chair and Counter Support - 1 x daily - 5 x weekly - 1 sets - 10 reps           PT Education - 08/13/20 1524     Education Details Reviewed HEP, educated pt to not overdo with exercises to the point he is overfatigued the next day; educated that mild muscle soreness is okay.    Person(s) Educated Patient    Methods Explanation    Comprehension Verbalized understanding              PT Short Term Goals - 08/03/20 1641       PT SHORT TERM GOAL #1   Title Pt will be independent with HEP for improved strength, balance, transfers, and gait.  TARGET 08/28/2020    Baseline No current HEP    Time 4    Period Weeks    Status New      PT SHORT TERM GOAL #2   Title Pt will improve 5x sit<>stand to less than or equal to 18  seconds to demonstrate improved functional strength and transfer efficiency.    Baseline 22.69 sec at eval    Time 4    Period Weeks    Status New      PT SHORT TERM GOAL #3   Title Pt will verbalize understanding of fall prevention in home environment.    Baseline fall risk per Berg, TUG, gait velocity    Time 4    Period Weeks    Status New      PT SHORT TERM GOAL #4   Title Pt will improve SLS to at least 2 seconds each leg for improved stability for balance, gait, stairs.    Baseline unable    Time 4    Period Weeks    Status New               PT Long Term Goals - 08/03/20 1647       PT LONG TERM GOAL #1   Title Pt will be independent with progression of HEP for improved strength, balance, transfers, and gait.  TARGET 10/02/2020    Baseline no current HEP    Time 9    Period Weeks    Status New      PT LONG TERM GOAL #2   Title Pt will improve 5x sit<>stand to less than or equal to 15 seconds to demonstrate improved functional strength and transfer efficiency.    Baseline 22.69 at eval    Time 9    Period Weeks    Status New      PT LONG TERM GOAL #3   Title Pt will improve Berg score to at least 35/56 to decrease fall risk.    Baseline 27/56 at eval    Time 9    Period Weeks    Status New      PT LONG TERM GOAL #4   Title Pt will improve gait velocity to at least 2.2 ft/sec for improved gait efficiency and safety.    Baseline 1.82 ft/sec    Time 9    Period Weeks    Status New      PT LONG TERM GOAL #5   Title Pt will verbalize plans for ongoing community fitness upon d/c from PT.    Baseline no current community fitness plan  Time 9    Period Weeks    Status New                   Plan - 08/13/20 1526     Clinical Impression Statement Reviewed HEP and continued with lower extremity strengthening and flexibility.  Pt has slight muscle soreness today, with PT educating pt that is okay but to avoid overdoing exercises that cause  over-fatigue to muscles days after therapy.  He agrees and verbalizes understanding.  He will continue to benefit from skilled PT to address strength of trunk and lower extrmeities as well as gait training.    Personal Factors and Comorbidities Comorbidity 2    Comorbidities MS June 2015 (relapsing/remitting), seizure hx    Examination-Activity Limitations Locomotion Level;Transfers;Stairs;Stand    Examination-Participation Restrictions Community Activity;Other   General fitnes   Stability/Clinical Decision Making Stable/Uncomplicated    Rehab Potential Good    PT Frequency 2x / week    PT Duration Other (comment)   9 weeks, including eval week   PT Treatment/Interventions ADLs/Self Care Home Management;Gait training;Stair training;Functional mobility training;Therapeutic activities;Therapeutic exercise;Balance training;Neuromuscular re-education;DME Instruction;Manual techniques;Orthotic Fit/Training;Patient/family education;Energy conservation    PT Next Visit Plan continue with tall kneeling/quadruped with emphasis on LLE weightbearing.  Quad, glut, hamstring strengthening.  Trial AFOs with potential orthotic consult    Consulted and Agree with Plan of Care Patient             Patient will benefit from skilled therapeutic intervention in order to improve the following deficits and impairments:  Abnormal gait, Decreased coordination, Difficulty walking, Impaired tone, Decreased balance, Postural dysfunction, Decreased strength, Decreased mobility  Visit Diagnosis: Muscle weakness (generalized)  Unsteadiness on feet  Other abnormalities of gait and mobility     Problem List Patient Active Problem List   Diagnosis Date Noted   History of optic neuritis 07/29/2020   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 08/13/2020, 3:29 PM Gean Maidens., PT  [facility] [address] [place], [place], [address] Phone: [phone]   Fax:  [phone]  Name: Miguel Black MRN: [id number] Date of Birth: [birth year]

## 2020-08-25 ENCOUNTER — Encounter: Payer: Self-pay | Admitting: Physical Therapy

## 2020-08-25 ENCOUNTER — Ambulatory Visit: Payer: Medicare (Managed Care) | Attending: Neurology | Admitting: Physical Therapy

## 2020-08-25 ENCOUNTER — Other Ambulatory Visit: Payer: Self-pay

## 2020-08-25 DIAGNOSIS — R2689 Other abnormalities of gait and mobility: Secondary | ICD-10-CM | POA: Insufficient documentation

## 2020-08-25 DIAGNOSIS — R2681 Unsteadiness on feet: Secondary | ICD-10-CM | POA: Diagnosis present

## 2020-08-25 DIAGNOSIS — M6281 Muscle weakness (generalized): Secondary | ICD-10-CM | POA: Diagnosis present

## 2020-08-25 NOTE — Therapy (Signed)
[facility] [address] [place], [place], [address] Phone: [phone]   Fax:  [phone]  Physical Therapy Treatment  Patient Details  Name: Miguel Black MRN: [id number] Date of Birth: 1990/12/31 Referring Provider (PT): Despina Arias, MD   Encounter Date: 08/25/2020   PT End of Session - 08/25/20 1511     Visit Number 4    Number of Visits 18    Date for PT Re-Evaluation 10/02/20    Authorization Type On chart:  [facility] Medicare/Medicaid    PT Start Time 1404    PT Stop Time 1445    PT Time Calculation (min) 41 min    Equipment Utilized During Treatment Gait belt    Activity Tolerance Patient tolerated treatment well    Behavior During Therapy WFL for tasks assessed/performed             Past Medical History:  Diagnosis Date   MS (multiple sclerosis) (HCC)    Seizure (HCC)    Status epilepticus (HCC) 07/12/2018    Past Surgical History:  Procedure Laterality Date   NO PAST SURGERIES      There were no vitals filed for this visit.   Subjective Assessment - 08/25/20 1408     Subjective No falls, doing the exercises-that's why I'm tired.  Not doing all of them at one time.    Pertinent History dx of MS June 2015 (relapsing/remitting), seizure hx    Patient Stated Goals Pt's goals for therapy are to strengthen L side and get rid of using the cane.    Currently in Pain? No/denies                               [facility] Adult PT Treatment/Exercise - 08/25/20 0001       Transfers   Transfers Sit to Stand;Stand to Sit    Sit to Stand 5: Supervision;With upper extremity assist;From bed    Stand to Sit 5: Supervision;With upper extremity assist;To chair/3-in-1      Ambulation/Gait   Ambulation/Gait Yes    Ambulation/Gait Assistance 4: Min guard    Ambulation/Gait Assistance Details Trial of varied orthotic devices in session today.  See below    Ambulation Distance (Feet) 60  Feet   x 2; multiple trials   Assistive device Straight cane   rounded tip base   Gait Pattern Step-through pattern;Decreased step length - left;Decreased hip/knee flexion - left;Decreased dorsiflexion - left;Ataxic;Poor foot clearance - left;Left genu recurvatum    Ambulation Surface Level;Indoor    Pre-Gait Activities Discussed options for orthotic/AFO and rationale to assist with improved gait pattern for improved foot clearance and less L knee recurvatum.  Pt can see an AFO may be helpful and agreeable that next step is for orthotic consult.    Gait Comments Gait trial 60 ft x 2 with foot up brace LLE:  pt demo increased foot clearance, heelstrike, increased recurvatum.  Gait trial using blue rocker AFO LLE 30 ft x 2 with increased foot clearance, but anterior>posterior instability with recurvatum at knee.  Gait trial with Thusane PLS AFO LLE 60 ft x 2 with improved foot clearance, continued significant L knee recurvatum.  Gait trial with Thusane PLS AFO and heelwedge LLE, with improved foot clearance, improved control of L knee recurvatum.      Therapeutic Activites    Therapeutic Activities Other Therapeutic Activities    Other Therapeutic Activities PT called and  spoke with Joni Reining at [facility] (after PT session):  need MD order for AFO/orthotic consult and MD notes stating need for brace.  Once orders received, [facility] will call to schedule patient.      Knee/Hip Exercises: Seated   Ball Squeeze 10 reps in sitting    Clamshell with TheraBand Green   10 reps   Other Seated Knee/Hip Exercises Seated glut sets 5 reps x 2 sets                    PT Education - 08/25/20 1510     Education Details Benefits/rationale for AFO; initiating AFO/orthotic consult process    Person(s) Educated Patient    Methods Explanation    Comprehension Verbalized understanding              PT Short Term Goals - 08/03/20 1641       PT SHORT TERM GOAL #1   Title Pt will be independent with  HEP for improved strength, balance, transfers, and gait.  TARGET 08/28/2020    Baseline No current HEP    Time 4    Period Weeks    Status New      PT SHORT TERM GOAL #2   Title Pt will improve 5x sit<>stand to less than or equal to 18 seconds to demonstrate improved functional strength and transfer efficiency.    Baseline 22.69 sec at eval    Time 4    Period Weeks    Status New      PT SHORT TERM GOAL #3   Title Pt will verbalize understanding of fall prevention in home environment.    Baseline fall risk per Berg, TUG, gait velocity    Time 4    Period Weeks    Status New      PT SHORT TERM GOAL #4   Title Pt will improve SLS to at least 2 seconds each leg for improved stability for balance, gait, stairs.    Baseline unable    Time 4    Period Weeks    Status New               PT Long Term Goals - 08/03/20 1647       PT LONG TERM GOAL #1   Title Pt will be independent with progression of HEP for improved strength, balance, transfers, and gait.  TARGET 10/02/2020    Baseline no current HEP    Time 9    Period Weeks    Status New      PT LONG TERM GOAL #2   Title Pt will improve 5x sit<>stand to less than or equal to 15 seconds to demonstrate improved functional strength and transfer efficiency.    Baseline 22.69 at eval    Time 9    Period Weeks    Status New      PT LONG TERM GOAL #3   Title Pt will improve Berg score to at least 35/56 to decrease fall risk.    Baseline 27/56 at eval    Time 9    Period Weeks    Status New      PT LONG TERM GOAL #4   Title Pt will improve gait velocity to at least 2.2 ft/sec for improved gait efficiency and safety.    Baseline 1.82 ft/sec    Time 9    Period Weeks    Status New      PT LONG TERM GOAL #5   Title  Pt will verbalize plans for ongoing community fitness upon d/c from PT.    Baseline no current community fitness plan    Time 9    Period Weeks    Status New                   Plan - 08/25/20  1511     Clinical Impression Statement Pt not seen last week due to scheduling conflicts in clinic; no appts available at time of scheduling.  Today's skilled PT session focused on trial of orthotic/AFOs to see what might be beneficial to improve pt's gait pattern to improve left foot clearance and decrease L knee recurvatum.  With use of Thusane PLS AFO and heel wedge, pt has the most improved foot clearance, heelstrike, and controlled recurvatum.  Without AFO, pt has foot flat pattern, decreased foot clearance, L knee recurvatum; pt fatigues easily and he is at high fall risk based on this gait pattern.  He would benefit from orthotic consult to make recommendations for apporpriate AFO to LLE for improved gait pattern to improve overall functional mobility and decrease fall risk.    Personal Factors and Comorbidities Comorbidity 2    Comorbidities MS June 2015 (relapsing/remitting), seizure hx    Examination-Activity Limitations Locomotion Level;Transfers;Stairs;Stand    Examination-Participation Restrictions Community Activity;Other   General fitnes   Stability/Clinical Decision Making Stable/Uncomplicated    Rehab Potential Good    PT Frequency 2x / week    PT Duration Other (comment)   9 weeks, including eval week   PT Treatment/Interventions ADLs/Self Care Home Management;Gait training;Stair training;Functional mobility training;Therapeutic activities;Therapeutic exercise;Balance training;Neuromuscular re-education;DME Instruction;Manual techniques;Orthotic Fit/Training;Patient/family education;Energy conservation    PT Next Visit Plan Check STGs.  continue with tall kneeling/quadruped with emphasis on LLE weightbearing.  Quad, glut, hamstring strengthening.  Follow up to see if Dr. Epimenio Foot placed AFO order on chart and make sure pt can get scheduled with [facility]    Recommended Other Services Orthotic consult-[facility]; PT initiating with order request (08/25/20) to Dr. Epimenio Foot and request to addend last MD  visit notes to reflect need for AFO    Consulted and Agree with Plan of Care Patient             Patient will benefit from skilled therapeutic intervention in order to improve the following deficits and impairments:  Abnormal gait, Decreased coordination, Difficulty walking, Impaired tone, Decreased balance, Postural dysfunction, Decreased strength, Decreased mobility  Visit Diagnosis: Other abnormalities of gait and mobility  Unsteadiness on feet     Problem List Patient Active Problem List   Diagnosis Date Noted   History of optic neuritis 07/29/2020   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 08/25/2020, 3:17 PM Gean Maidens., PT   [facility] [address] [place], [place], [address] Phone: [phone]   Fax:  [phone]  Name: Miguel Black MRN: [id number] Date of Birth: [birth year]

## 2020-08-27 ENCOUNTER — Telehealth: Payer: Self-pay | Admitting: Physical Therapy

## 2020-08-27 ENCOUNTER — Ambulatory Visit: Payer: Medicare (Managed Care) | Admitting: Physical Therapy

## 2020-08-27 ENCOUNTER — Other Ambulatory Visit: Payer: Self-pay

## 2020-08-27 ENCOUNTER — Encounter: Payer: Self-pay | Admitting: Physical Therapy

## 2020-08-27 DIAGNOSIS — R2689 Other abnormalities of gait and mobility: Secondary | ICD-10-CM | POA: Diagnosis not present

## 2020-08-27 DIAGNOSIS — R2681 Unsteadiness on feet: Secondary | ICD-10-CM

## 2020-08-27 DIAGNOSIS — M6281 Muscle weakness (generalized): Secondary | ICD-10-CM

## 2020-08-27 NOTE — Patient Instructions (Signed)
It is important to avoid accidents which may result in broken bones.  Here are a few ideas on how to make your home safer so you will be less likely to trip or fall.  Use nonskid mats or non slip strips in your shower or tub, on your bathroom floor and around sinks.  If you know that you have spilled water, wipe it up! In the bathroom, it is important to have properly installed grab bars on the walls or on the edge of the tub.  Towel racks are NOT strong enough for you to hold onto or to pull on for support. Stairs and hallways should have enough light.  Add lamps or night lights if you need ore light. It is good to have handrails on both sides of the stairs if possible.  Always fix broken handrails right away. It is important to see the edges of steps.  Paint the edges of outdoor steps white so you can see them better.  Put colored tape on the edge of inside steps. Throw-rugs are dangerous because they can slide.  Removing the rugs is the best idea, but if they must stay, add adhesive carpet tape to prevent slipping. Do not keep things on stairs or in the halls.  Remove small furniture that blocks the halls as it may cause you to trip.  Keep telephone and electrical cords out of the way where you walk. Always were sturdy, rubber-soled shoes for good support.  Never wear just socks, especially on the stairs.  Socks may cause you to slip or fall.  Do not wear full-length housecoats as you can easily trip on the bottom.  Place the things you use the most on the shelves that are the easiest to reach.  If you use a stepstool, make sure it is in good condition.  If you feel unsteady, DO NOT climb, ask for help. If a health professional advises you to use a cane or walker, do not be ashamed.  These items can keep you from falling and breaking your bones.    Energy Conservation Techniques  Sit for as many activities as possible. Use slow, smooth movements.  Rushing increases discomfort. Determine the  necessity of performing the task.  Simplify those tasks that are necessary.  (Get clothes out of the dryer when they are warm instead of ironing, let dishes air dry, etc.) Take frequent rests both during and between activities.  Avoid repetitive tasks. Pre-plan your activities; try a daily and/or weekly schedule.  Spread out the activities that are most fatiguing (break up cleaning tasks over multiple days). Remember to plan a balance of work, rest and recreation. Consider the best time for each activity.  Do the most exertive task when you have the most energy. Don't carry items if you can push them.  Slide, don't lift. Push, don't pull. Utilize two hands when appropriate. Maintain good posture and use proper body mechanics. Avoid remaining in one position for too long. When lifting, bend at the knees, not at the waist.  Exhale when bending down, inhale when straightening up. Carry objects as close to your body and as near to the center of the pelvis.  11. Avoid wasted body movements (position yourself for the task so that you avoid bending, twisting, etc.                when possible). 12. Select the best working environment.  Consider lighting, ventilation, clothing, and equipment. 13. Organize your storage areas, making  the items you use daily convenient.  Store heaviest items at waist            height.  Store frequently used items between shoulders and knee height.  Consider leaving frequently used       items on countertops.  (You can organize in storage baskets based on time used/purpose). 14. Feelings and emotions can be real causes of fatigue.  Try to avoid unnecessary worry, irritation, or                    frustration.  Avoid stress, it can also be a source of fatigue. 15. Get help from other people for difficult tasks. 16. Explore equipment or items that may be able to do the job for you with greater ease.  (Electric can        openers, blenders, lightweight items for cleaning, etc.)

## 2020-08-27 NOTE — Telephone Encounter (Signed)
Dr. Epimenio Foot, I have been working with Ramond Craver for physical therapy and feel that he would benefit from an AFO (ankle foot orthosis) for his L foot to assist with foot clearance, safety, and efficiency with gait.  He is at significant fall risk with gait, and when trialing AFO in clinic sessions, he demonstrates improved foot clearance and step length for improved gait pattern.  If you agree, could you please place an order for AFO/orthotics consult?  I have spoken with [facility], and they will need documentation from a recent MD visit (which may need to be addended with information about why AFO would be needed for this patient).  Could you please have your office fax order and MD notes to [facility] at [phone]?  Please see specifics about pt's gait and trials of AFOs from my PT note on 08/25/2020.  If you have any questions, please let me know.  Thank you.  Lonia Blood, PT 08/27/20 3:19 PM Phone: [phone] Fax: [phone]

## 2020-08-27 NOTE — Therapy (Signed)
[facility] [address] [place], [place], [address] Phone: [phone]   Fax:  [phone]  Physical Therapy Treatment  Patient Details  Name: Miguel Black MRN: [id number] Date of Birth: Mar 08, 1990 Referring Provider (PT): Arlice Colt, MD   Encounter Date: 08/27/2020   PT End of Session - 08/27/20 1318     Visit Number 5    Number of Visits 18    Date for PT Re-Evaluation 10/02/20    Authorization Type On chart:  [facility] Medicare/Medicaid    PT Start Time 1318    PT Stop Time 1359    PT Time Calculation (min) 41 min    Equipment Utilized During Treatment Gait belt    Activity Tolerance Patient tolerated treatment well    Behavior During Therapy WFL for tasks assessed/performed             Past Medical History:  Diagnosis Date   MS (multiple sclerosis) ([place])    Seizure ([place])    Status epilepticus ([place]) 07/12/2018    Past Surgical History:  Procedure Laterality Date   NO PAST SURGERIES      There were no vitals filed for this visit.   Subjective Assessment - 08/27/20 1318     Subjective Definitely feel tired from exercises, have to try not to overdo it with the MS.  I do feel like I am getting a little stronger.  No falls.    Pertinent History dx of MS June 2015 (relapsing/remitting), seizure hx    Patient Stated Goals Pt's goals for therapy are to strengthen L side and get rid of using the cane.    Currently in Pain? No/denies                               [facility] Adult PT Treatment/Exercise - 08/27/20 0001       Transfers   Transfers Sit to Stand;Stand to Sit    Sit to Stand 5: Supervision;With upper extremity assist;From bed    Five time sit to stand comments  23.56    Stand to Sit 5: Supervision;With upper extremity assist;To chair/3-in-1    Comments Pt able to quickly stand up with UE support, brief time of sit between reps      Ambulation/Gait   Ambulation/Gait  Yes    Ambulation/Gait Assistance 4: Min guard    Ambulation Distance (Feet) 60 Feet   x 2   Assistive device Straight cane   round tip base   Gait Pattern Step-through pattern;Decreased step length - left;Decreased hip/knee flexion - left;Decreased dorsiflexion - left;Ataxic;Poor foot clearance - left;Left genu recurvatum    Ambulation Surface Level;Indoor      High Level Balance   High Level Balance Comments SLS:  1-2 sec RLE, unable on LLE without LOB      Therapeutic Activites    Therapeutic Activities Other Therapeutic Activities    Other Therapeutic Activities Discussed information from [facility] phone call on Tuesday.  Let pt know that PT routed therapy note/request for AFO order to Dr. Felecia Shelling, but do not yet see order on the chart.  PT will try again and send via telephone encounter.  Discussed fall prevention, energy conservation in regards to MS.  Discussed progress towards STGs.      Knee/Hip Exercises: Seated   Other Seated Knee/Hip Exercises Seated glut sets 5 reps x 2 sets, with addition of heel digs    Other Seated  Knee/Hip Exercises Seated heel/toe raises x 10 reps    Marching Strengthening;Right;Left;1 set;5 reps    Marching Limitations Cues for control for abdominal activation.  Progresing to alt UE/knee taps with abdomianl activation.             Access Code: [email or web address] URL: [email or web address] Date: 08/11/2020 Prepared by: Willow Ora   Exercises-Reviewed full HEP (5 reps of each in session today), with pt return demo understanding. Supine Bridge - 1 x daily - 5 x weekly - 1 sets - 10 reps Single Leg Bridge - 1 x daily - 5 x weekly - 1 sets - 10 reps Hooklying Clamshell with Resistance - 1 x daily - 5 x weekly - 1 sets - 10 reps Supine March with Resistance Band - 1 x daily - 5 x weekly - 1 sets - 10 reps Sit to/from Stand in Stride position - 1 x daily - 5 x weekly - 1 sets - 10 reps Heel Toe Raises with Counter Support - 1 x daily - 5 x weekly - 1  sets - 10 reps Squat with Chair and Counter Support - 1 x daily - 5 x weekly - 1 sets - 10 reps       PT Education - 08/27/20 1503     Education Details Follow-up on AFO/orthotic consult process; fall prevention education, energy conservation education    Person(s) Educated Patient    Methods Explanation;Handout    Comprehension Verbalized understanding              PT Short Term Goals - 08/27/20 1506       PT SHORT TERM GOAL #1   Title Pt will be independent with HEP for improved strength, balance, transfers, and gait.  TARGET 08/28/2020    Baseline No current HEP; 08/27/20 independent with current HEP    Time 4    Period Weeks    Status Achieved      PT SHORT TERM GOAL #2   Title Pt will improve 5x sit<>stand to less than or equal to 18 seconds to demonstrate improved functional strength and transfer efficiency.    Baseline 22.69 sec at eval; 23.56 08/27/20    Time 4    Period Weeks    Status Not Met      PT SHORT TERM GOAL #3   Title Pt will verbalize understanding of fall prevention in home environment.    Baseline fall risk per Merrilee Jansky, TUG, gait velocity; met per discussion 08/27/2020    Time 4    Period Weeks    Status Achieved      PT SHORT TERM GOAL #4   Title Pt will improve SLS to at least 2 seconds each leg for improved stability for balance, gait, stairs.    Baseline SLS 1-2 sec RLE with unsteadiness; unable LLE    Time 4    Period Weeks    Status Not Met               PT Long Term Goals - 08/03/20 1647       PT LONG TERM GOAL #1   Title Pt will be independent with progression of HEP for improved strength, balance, transfers, and gait.  TARGET 10/02/2020    Baseline no current HEP    Time 9    Period Weeks    Status New      PT LONG TERM GOAL #2   Title Pt will improve 5x sit<>stand to less than or equal to 15  seconds to demonstrate improved functional strength and transfer efficiency.    Baseline 22.69 at eval    Time 9    Period Weeks     Status New      PT LONG TERM GOAL #3   Title Pt will improve Berg score to at least 35/56 to decrease fall risk.    Baseline 27/56 at eval    Time 9    Period Weeks    Status New      PT LONG TERM GOAL #4   Title Pt will improve gait velocity to at least 2.2 ft/sec for improved gait efficiency and safety.    Baseline 1.82 ft/sec    Time 9    Period Weeks    Status New      PT LONG TERM GOAL #5   Title Pt will verbalize plans for ongoing community fitness upon d/c from PT.    Baseline no current community fitness plan    Time 9    Period Weeks    Status New                   Plan - 08/27/20 1508     Clinical Impression Statement Assessed STGs this visit, with pt meeting 2 of 4 STGs.  STG 1 met for HEP and STG 3 met for fall prevention.  STG 2 and 4 not met, with increased time today on 5x sit<>stand and slightly improved time on single limb stance on RLE.  Pt reports even though he is fatigued with the exercises, that he is beginning to notice improvement in strength.  No order on chart yet for orthotic consult, but will continue to follow.  Pt will continue to benefit from skilled PT to address strength, balance, gait for improved overall functional mobility and decreased falls.    Personal Factors and Comorbidities Comorbidity 2    Comorbidities MS June 2015 (relapsing/remitting), seizure hx    Examination-Activity Limitations Locomotion Level;Transfers;Stairs;Stand    Examination-Participation Restrictions Community Activity;Other   General fitnes   Stability/Clinical Decision Making Stable/Uncomplicated    Rehab Potential Good    PT Frequency 2x / week    PT Duration Other (comment)   9 weeks, including eval week   PT Treatment/Interventions ADLs/Self Care Home Management;Gait training;Stair training;Functional mobility training;Therapeutic activities;Therapeutic exercise;Balance training;Neuromuscular re-education;DME Instruction;Manual techniques;Orthotic  Fit/Training;Patient/family education;Energy conservation    PT Next Visit Plan continue hip and trunk stregnthening with tall kneeling/quadruped with emphasis on LLE weightbearing.  Quad, glut, hamstring strengthening.  Follow up to see if Dr. Felecia Shelling placed AFO order on chart and make sure pt can get scheduled with [facility]    Consulted and Agree with Plan of Care Patient             Patient will benefit from skilled therapeutic intervention in order to improve the following deficits and impairments:  Abnormal gait, Decreased coordination, Difficulty walking, Impaired tone, Decreased balance, Postural dysfunction, Decreased strength, Decreased mobility  Visit Diagnosis: Muscle weakness (generalized)  Unsteadiness on feet  Other abnormalities of gait and mobility     Problem List Patient Active Problem List   Diagnosis Date Noted   History of optic neuritis 07/29/2020   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 08/27/2020, 3:13 PM Frazier Butt., PT  [facility] [address] [place], [place], [address] Phone: [phone]   Fax:  [phone]  Name: Miguel Black MRN: [id number] Date of Birth: 10/04/1990

## 2020-08-29 ENCOUNTER — Ambulatory Visit
Admission: RE | Admit: 2020-08-29 | Discharge: 2020-08-29 | Disposition: A | Discharge status: Home / Self Care | Payer: Medicare (Managed Care) | Source: Ambulatory Visit | Attending: Neurology | Admitting: Neurology

## 2020-08-29 ENCOUNTER — Other Ambulatory Visit: Payer: Self-pay

## 2020-08-29 DIAGNOSIS — R269 Unspecified abnormalities of gait and mobility: Secondary | ICD-10-CM

## 2020-08-29 DIAGNOSIS — G35 Multiple sclerosis: Secondary | ICD-10-CM

## 2020-08-29 MED ORDER — GADOBENATE DIMEGLUMINE 529 MG/ML IV SOLN
14.0000 mL | Freq: Once | INTRAVENOUS | Status: AC | PRN
  Administered 2020-08-29: 14 mL via INTRAVENOUS

## 2020-09-02 ENCOUNTER — Other Ambulatory Visit: Payer: Self-pay | Admitting: Neurology

## 2020-09-02 DIAGNOSIS — R531 Weakness: Secondary | ICD-10-CM

## 2020-09-02 DIAGNOSIS — G35 Multiple sclerosis: Secondary | ICD-10-CM

## 2020-09-02 DIAGNOSIS — R269 Unspecified abnormalities of gait and mobility: Secondary | ICD-10-CM

## 2020-09-03 ENCOUNTER — Telehealth: Payer: Self-pay | Admitting: *Deleted

## 2020-09-03 ENCOUNTER — Ambulatory Visit: Payer: Medicare (Managed Care) | Admitting: Physical Therapy

## 2020-09-03 NOTE — Telephone Encounter (Signed)
Called and spoke w/ pt about results per Dr. Bonnita Hollow note. He verbalized understanding and appreciation.

## 2020-09-03 NOTE — Telephone Encounter (Signed)
-----   Message from Asa Lente, MD sent at 09/02/2020  5:43 PM EDT ----- Please let him know that the MRI of the brain and spinal cord did not show any brand-new lesions.  Compared to his previous MRI there was 1 small new lesion on the brain.  I recommend that he continue the Ocrevus.

## 2020-09-08 ENCOUNTER — Ambulatory Visit: Payer: Medicare (Managed Care) | Admitting: Physical Therapy

## 2020-09-08 ENCOUNTER — Other Ambulatory Visit: Payer: Self-pay

## 2020-09-08 ENCOUNTER — Encounter: Payer: Self-pay | Admitting: Physical Therapy

## 2020-09-08 DIAGNOSIS — M6281 Muscle weakness (generalized): Secondary | ICD-10-CM

## 2020-09-08 DIAGNOSIS — R2689 Other abnormalities of gait and mobility: Secondary | ICD-10-CM | POA: Diagnosis not present

## 2020-09-08 NOTE — Therapy (Signed)
[facility] [address] [place], [place], [address] Phone: [phone]   Fax:  [phone]  Physical Therapy Treatment  Patient Details  Name: Miguel Black MRN: [id number] Date of Birth: [birth year] Referring Provider (PT): Arlice Colt, MD   Encounter Date: 09/08/2020   PT End of Session - 09/08/20 1233     Visit Number 6    Number of Visits 18    Date for PT Re-Evaluation 10/02/20    Authorization Type On chart:  [facility] Medicare/Medicaid    PT Start Time 1234    PT Stop Time 1313    PT Time Calculation (min) 39 min    Equipment Utilized During Treatment Gait belt    Activity Tolerance Patient tolerated treatment well    Behavior During Therapy WFL for tasks assessed/performed             Past Medical History:  Diagnosis Date   MS (multiple sclerosis) ([place])    Seizure ([place])    Status epilepticus ([place]) 07/12/2018    Past Surgical History:  Procedure Laterality Date   NO PAST SURGERIES      There were no vitals filed for this visit.   Subjective Assessment - 09/08/20 1234     Subjective No changes.  Been working on my exercises; my ride didn't work out on Thursday, so I missed therapy.    Pertinent History dx of MS June 2015 (relapsing/remitting), seizure hx    Patient Stated Goals Pt's goals for therapy are to strengthen L side and get rid of using the cane.    Currently in Pain? No/denies                               [facility] Adult PT Treatment/Exercise - 09/08/20 0001       High Level Balance   High Level Balance Activities Marching turns;Other (comment)    High Level Balance Comments LLE as stance leg with RLE propped at 4" block, RUE support at chair, standing in this position x 10 sec, 2 reps, then standing with head turns/nods 5 reps x 2 sets, then with lessening UE support, x 5 reps.  PT provides min guard assist and tactile cues to avoid L knee recurvatum.       Neuro Re-ed    Neuro Re-ed Details  SEated on blue therapy ball to address trunk control, with ball positioned between mat and physical therapist, with therapist assisting to hold ball steady. Pt with UE support at mat:  anterior/posterior rocking 5-10 reps, lateral rocking x 10 reps to facilitate increased weigthshift through L hip.  Abdominal activation with alt UE lifts x 5, BUE lifts x 5, BUE lifts/trunk rotation x 5 reps.  In neutral upright trunk position, rhythmic stabilization ant/posterior resistance at shoulders x 8 reps.      Exercises   Exercises Other Exercises    Other Exercises  SEated anterior/posterior pelvic tilts x 10 reps, then lateral pelvic tilt x 10 reps, focus on equal weigthbearing through bilat hips .  Abdominal activation with outstretched arms with resistance in varied directions, 2 sets x 8 reps.      Knee/Hip Exercises: Seated   Ball Squeeze 10 reps in sitting    Other Seated Knee/Hip Exercises Seated glut sets 10 reps at edge of mat    Sit to Sand 5 reps;with UE support;Other (comment);2 sets   with ball squeeze x 5 reps, then  2nd set with no ball, but standing on [employer]x          With sit<>stand, cues for slowed descent for improved eccentric control into sitting.           PT Short Term Goals - 08/27/20 1506       PT SHORT TERM GOAL #1   Title Pt will be independent with HEP for improved strength, balance, transfers, and gait.  TARGET 08/28/2020    Baseline No current HEP; 08/27/20 independent with current HEP    Time 4    Period Weeks    Status Achieved      PT SHORT TERM GOAL #2   Title Pt will improve 5x sit<>stand to less than or equal to 18 seconds to demonstrate improved functional strength and transfer efficiency.    Baseline 22.69 sec at eval; 23.56 08/27/20    Time 4    Period Weeks    Status Not Met      PT SHORT TERM GOAL #3   Title Pt will verbalize understanding of fall prevention in home environment.    Baseline fall risk per Merrilee Jansky,  TUG, gait velocity; met per discussion 08/27/2020    Time 4    Period Weeks    Status Achieved      PT SHORT TERM GOAL #4   Title Pt will improve SLS to at least 2 seconds each leg for improved stability for balance, gait, stairs.    Baseline SLS 1-2 sec RLE with unsteadiness; unable LLE    Time 4    Period Weeks    Status Not Met               PT Long Term Goals - 08/03/20 1647       PT LONG TERM GOAL #1   Title Pt will be independent with progression of HEP for improved strength, balance, transfers, and gait.  TARGET 10/02/2020    Baseline no current HEP    Time 9    Period Weeks    Status New      PT LONG TERM GOAL #2   Title Pt will improve 5x sit<>stand to less than or equal to 15 seconds to demonstrate improved functional strength and transfer efficiency.    Baseline 22.69 at eval    Time 9    Period Weeks    Status New      PT LONG TERM GOAL #3   Title Pt will improve Berg score to at least 35/56 to decrease fall risk.    Baseline 27/56 at eval    Time 9    Period Weeks    Status New      PT LONG TERM GOAL #4   Title Pt will improve gait velocity to at least 2.2 ft/sec for improved gait efficiency and safety.    Baseline 1.82 ft/sec    Time 9    Period Weeks    Status New      PT LONG TERM GOAL #5   Title Pt will verbalize plans for ongoing community fitness upon d/c from PT.    Baseline no current community fitness plan    Time 9    Period Weeks    Status New                   Plan - 09/08/20 1741     Clinical Impression Statement Skilled PT session this visit focused on trunk stability and control as well as work on  increased LLE weightshifting and LLE SLS.  Pt needs cues and assist throughout for stabilizing neutral position at trunk with UE motions in sitting on therapy ball.  In standing, working on SLE, LLE recurvatum noted and PT provides manual/tactile cues to lessen/prevent recurvatum.  Pt continues to benefit from skilled PT to  address strength, balance, gait for improved overall functional mobility.    Personal Factors and Comorbidities Comorbidity 2    Comorbidities MS June 2015 (relapsing/remitting), seizure hx    Examination-Activity Limitations Locomotion Level;Transfers;Stairs;Stand    Examination-Participation Restrictions Community Activity;Other   General fitnes   Stability/Clinical Decision Making Stable/Uncomplicated    Rehab Potential Good    PT Frequency 2x / week    PT Duration Other (comment)   9 weeks, including eval week   PT Treatment/Interventions ADLs/Self Care Home Management;Gait training;Stair training;Functional mobility training;Therapeutic activities;Therapeutic exercise;Balance training;Neuromuscular re-education;DME Instruction;Manual techniques;Orthotic Fit/Training;Patient/family education;Energy conservation    PT Next Visit Plan continue hip and trunk stregnthening with tall kneeling/quadruped or use of therapy ball with emphasis on LLE weightbearing.  Quad, glut, hamstring strengthening.  Dr. Felecia Shelling did place AFO order on chart and PT follolwing up to make sure pt can get scheduled with [facility]    Consulted and Agree with Plan of Care Patient             Patient will benefit from skilled therapeutic intervention in order to improve the following deficits and impairments:  Abnormal gait, Decreased coordination, Difficulty walking, Impaired tone, Decreased balance, Postural dysfunction, Decreased strength, Decreased mobility  Visit Diagnosis: Muscle weakness (generalized)     Problem List Patient Active Problem List   Diagnosis Date Noted   History of optic neuritis 07/29/2020   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 09/08/2020, 5:45 PM Mady Haagensen, PT 09/08/20 5:46 PM Phone: [phone] Fax: [facility] [address] [place], [place], [address] Phone: [phone]   Fax:  [phone]  Name: Miguel Black MRN: [id number] Date of Birth: 19-Oct-1990

## 2020-09-09 ENCOUNTER — Telehealth: Payer: Self-pay | Admitting: Physical Therapy

## 2020-09-09 NOTE — Telephone Encounter (Signed)
Called and spoke with  [facility] at [facility] to ask if they had received pt's order for AFO from Dr. Epimenio Foot.  She reports they had received order and this patient is the next on her list to call for scheduling for consult in the clinic.  Lonia Blood, PT 09/09/20 4:05 PM Phone: [phone] Fax: [phone]

## 2020-09-10 ENCOUNTER — Encounter: Payer: Self-pay | Admitting: Physical Therapy

## 2020-09-10 ENCOUNTER — Other Ambulatory Visit: Payer: Self-pay

## 2020-09-10 ENCOUNTER — Ambulatory Visit: Payer: Medicare (Managed Care) | Admitting: Physical Therapy

## 2020-09-10 DIAGNOSIS — R2689 Other abnormalities of gait and mobility: Secondary | ICD-10-CM | POA: Diagnosis not present

## 2020-09-10 DIAGNOSIS — R2681 Unsteadiness on feet: Secondary | ICD-10-CM

## 2020-09-10 DIAGNOSIS — M6281 Muscle weakness (generalized): Secondary | ICD-10-CM

## 2020-09-11 NOTE — Therapy (Signed)
[facility] [address] [place], [place], [address] Phone: [phone]   Fax:  [phone]  Physical Therapy Treatment  Patient Details  Name: Miguel Black MRN: [id number] Date of Birth: 1990/04/04 Referring Provider (PT): Arlice Colt, MD   Encounter Date: 09/10/2020   PT End of Session - 09/10/20 1323     Visit Number 7    Number of Visits 18    Date for PT Re-Evaluation 10/02/20    Authorization Type On chart:  [facility] Medicare/Medicaid    PT Start Time 9179    PT Stop Time 1400    PT Time Calculation (min) 43 min    Equipment Utilized During Treatment Gait belt    Activity Tolerance Patient tolerated treatment well    Behavior During Therapy WFL for tasks assessed/performed             Past Medical History:  Diagnosis Date   MS (multiple sclerosis) ([place])    Seizure ([place])    Status epilepticus ([place]) 07/12/2018    Past Surgical History:  Procedure Laterality Date   NO PAST SURGERIES      There were no vitals filed for this visit.   Subjective Assessment - 09/10/20 1321     Subjective No new complatints. No falls or pain to report. Has not heard from [facility] about brace to date. Informed primary PT has called them, that they have the referral and should be calling him.    Pertinent History dx of MS June 2015 (relapsing/remitting), seizure hx    Patient Stated Goals Pt's goals for therapy are to strengthen L side and get rid of using the cane.                    [facility] Adult PT Treatment/Exercise - 09/10/20 1324       Transfers   Transfers Sit to Stand;Stand to Sit    Sit to Stand 5: Supervision;With upper extremity assist;From bed    Stand to Sit 5: Supervision;With upper extremity assist;To chair/3-in-1      Ambulation/Gait   Ambulation/Gait Yes    Ambulation/Gait Assistance 4: Min guard    Ambulation/Gait Assistance Details around clinic with session    Assistive device  Straight cane   round tip   Gait Pattern Step-through pattern;Decreased step length - left;Decreased hip/knee flexion - left;Decreased dorsiflexion - left;Ataxic;Poor foot clearance - left;Left genu recurvatum    Ambulation Surface Level;Indoor      Neuro Re-ed    Neuro Re-ed Details  for strengthening/balance/NMR: quadruped on mat table- cat<>camel with decreased spinal movment noted with cues to maintain elbow extension for 10 reps; then alternating UE raises for 5 reps each side, then alternating LE slides out/in for 5 reps each side. assist for stability at pelvis with cues on form/technique; tall kneeling with blue Kaye bench- alternating UE raises, progressing to bil UE raises with min guard to min assist, cues for hip extension as pt fatigued. then in tall kneeling with hands hovering over bench for lateral weight shifting with cues/facilitaiton for posture/technique. Back in tall kneeling with light UE support: mini squats for 10 reps with cues on form, min guard assist. Then had pt work on alternating LE's moving knee out/back in for 10 reps each side, cues on form/technique with min guard assist for balance. Then had pt work on alternating moving knee backwards/forwards on mat for 10 reps each side, min guard assist with cues.  PT Short Term Goals - 08/27/20 1506       PT SHORT TERM GOAL #1   Title Pt will be independent with HEP for improved strength, balance, transfers, and gait.  TARGET 08/28/2020    Baseline No current HEP; 08/27/20 independent with current HEP    Time 4    Period Weeks    Status Achieved      PT SHORT TERM GOAL #2   Title Pt will improve 5x sit<>stand to less than or equal to 18 seconds to demonstrate improved functional strength and transfer efficiency.    Baseline 22.69 sec at eval; 23.56 08/27/20    Time 4    Period Weeks    Status Not Met      PT SHORT TERM GOAL #3   Title Pt will verbalize understanding of fall prevention in  home environment.    Baseline fall risk per Merrilee Jansky, TUG, gait velocity; met per discussion 08/27/2020    Time 4    Period Weeks    Status Achieved      PT SHORT TERM GOAL #4   Title Pt will improve SLS to at least 2 seconds each leg for improved stability for balance, gait, stairs.    Baseline SLS 1-2 sec RLE with unsteadiness; unable LLE    Time 4    Period Weeks    Status Not Met               PT Long Term Goals - 08/03/20 1647       PT LONG TERM GOAL #1   Title Pt will be independent with progression of HEP for improved strength, balance, transfers, and gait.  TARGET 10/02/2020    Baseline no current HEP    Time 9    Period Weeks    Status New      PT LONG TERM GOAL #2   Title Pt will improve 5x sit<>stand to less than or equal to 15 seconds to demonstrate improved functional strength and transfer efficiency.    Baseline 22.69 at eval    Time 9    Period Weeks    Status New      PT LONG TERM GOAL #3   Title Pt will improve Berg score to at least 35/56 to decrease fall risk.    Baseline 27/56 at eval    Time 9    Period Weeks    Status New      PT LONG TERM GOAL #4   Title Pt will improve gait velocity to at least 2.2 ft/sec for improved gait efficiency and safety.    Baseline 1.82 ft/sec    Time 9    Period Weeks    Status New      PT LONG TERM GOAL #5   Title Pt will verbalize plans for ongoing community fitness upon d/c from PT.    Baseline no current community fitness plan    Time 9    Period Weeks    Status New                   Plan - 09/10/20 1324     Clinical Impression Statement Today's skilled session continued to focus on core/LE strengthening with rest breaks taken as needed to prevent over-fatigue. No issues noted or reported in session. The pt is progressing toward goals and should benefit from continued PT to progress toward unmet goals.    Personal Factors and Comorbidities Comorbidity 2    Comorbidities MS  June 2015  (relapsing/remitting), seizure hx    Examination-Activity Limitations Locomotion Level;Transfers;Stairs;Stand    Examination-Participation Restrictions Community Activity;Other   General fitnes   Stability/Clinical Decision Making Stable/Uncomplicated    Rehab Potential Good    PT Frequency 2x / week    PT Duration Other (comment)   9 weeks, including eval week   PT Treatment/Interventions ADLs/Self Care Home Management;Gait training;Stair training;Functional mobility training;Therapeutic activities;Therapeutic exercise;Balance training;Neuromuscular re-education;DME Instruction;Manual techniques;Orthotic Fit/Training;Patient/family education;Energy conservation    PT Next Visit Plan continue hip and trunk stregnthening with tall kneeling/quadruped or use of therapy ball with emphasis on LLE weightbearing.  Quad, glut, hamstring strengthening.  Has [facility] called to set up appt for brace?    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

## 2020-09-15 ENCOUNTER — Encounter: Payer: Self-pay | Admitting: Physical Therapy

## 2020-09-15 ENCOUNTER — Other Ambulatory Visit: Payer: Self-pay

## 2020-09-15 ENCOUNTER — Ambulatory Visit: Payer: Medicare (Managed Care) | Admitting: Physical Therapy

## 2020-09-15 DIAGNOSIS — R2681 Unsteadiness on feet: Secondary | ICD-10-CM

## 2020-09-15 DIAGNOSIS — M6281 Muscle weakness (generalized): Secondary | ICD-10-CM

## 2020-09-15 DIAGNOSIS — R2689 Other abnormalities of gait and mobility: Secondary | ICD-10-CM | POA: Diagnosis not present

## 2020-09-15 NOTE — Therapy (Signed)
[facility] [address] [place], [place], [address] Phone: [phone]   Fax:  [phone]  Physical Therapy Treatment  Patient Details  Name: Miguel Black MRN: [id number] Date of Birth: 08/15/1990 Referring Provider (PT): Miguel Colt, MD   Encounter Date: 09/15/2020   PT End of Session - 09/15/20 1239     Visit Number 8    Number of Visits 18    Date for PT Re-Evaluation 10/02/20    Authorization Type On chart:  [facility] Medicare/Medicaid    PT Start Time 1238   pt arrives late   PT Stop Time 1316    PT Time Calculation (min) 38 min    Equipment Utilized During Treatment Gait belt    Activity Tolerance Patient tolerated treatment well    Behavior During Therapy WFL for tasks assessed/performed             Past Medical History:  Diagnosis Date   MS (multiple sclerosis) ([place])    Seizure ([place])    Status epilepticus ([place]) 07/12/2018    Past Surgical History:  Procedure Laterality Date   NO PAST SURGERIES      There were no vitals filed for this visit.   Subjective Assessment - 09/15/20 1240     Subjective Has not heard from [facility] about AFO consult yet.    Pertinent History dx of MS June 2015 (relapsing/remitting), seizure hx    Patient Stated Goals Pt's goals for therapy are to strengthen L side and get rid of using the cane.    Currently in Pain? No/denies                               [facility] Adult PT Treatment/Exercise - 09/15/20 0001       Transfers   Transfers Sit to Stand;Stand to Sit    Sit to Stand 5: Supervision;With upper extremity assist;From bed    Stand to Sit 5: Supervision;With upper extremity assist;To chair/3-in-1      Knee/Hip Exercises: Seated   Ball Squeeze 10 reps in sitting    Other Seated Knee/Hip Exercises Seated glut sets 10 reps at edge of mat    Other Seated Knee/Hip Exercises Seated single step out and in, 5 reps, 2 sets             Seated edge of mat, with forward lean to upright posture, x 10 reps, cues to avoid posterior lean.  Neuro Re-education:   SEated on blue therapy ball to address trunk control, with ball positioned between mat and physical therapist, with therapist assisting to hold ball steady. Pt with UE support at mat:  anterior/posterior pelvic tilts, x 10 reps, lateral rocking x 10 reps to facilitate increased weigthshift through L hip.  Abdominal activation with alt UE lifts 2 sets  x 5, BUE lifts 2 sets x 5, BUE lifts/trunk rotation 2 sets x 5 reps.  In neutral upright trunk position, rhythmic stabilization ant/posterior resistance at shoulders x 8 reps.  Cues for abdominal activation with hip/knee flexion x 5 reps, then LAQ x 5 reps.      Balance Exercises - 09/15/20 0001       Balance Exercises: Standing   Standing Eyes Opened Wide (BOA);Narrow base of support (BOS);1 rep;30 secs    Standing Eyes Closed Wide (BOA);Narrow base of support (BOS);Solid surface;3 reps;10 secs;Limitations    Standing Eyes Closed Limitations progressing from BUE to single UE to  no UE support    SLS Eyes open;Solid surface;Upper extremity support 2;3 reps;10 secs    Partial Tandem Stance Eyes open;Intermittent upper extremity support;2 reps;10 secs               PT Education - 09/15/20 1521     Education Details PT Called [facility] during session and they report they will call pt this afternoon; discussed possibility of putting PT on hold until pt gets his brace (after current scheduled appointments)    Person(s) Educated Patient    Methods Explanation    Comprehension Verbalized understanding              PT Short Term Goals - 08/27/20 1506       PT SHORT TERM GOAL #1   Title Pt will be independent with HEP for improved strength, balance, transfers, and gait.  TARGET 08/28/2020    Baseline No current HEP; 08/27/20 independent with current HEP    Time 4    Period Weeks    Status Achieved      PT SHORT  TERM GOAL #2   Title Pt will improve 5x sit<>stand to less than or equal to 18 seconds to demonstrate improved functional strength and transfer efficiency.    Baseline 22.69 sec at eval; 23.56 08/27/20    Time 4    Period Weeks    Status Not Met      PT SHORT TERM GOAL #3   Title Pt will verbalize understanding of fall prevention in home environment.    Baseline fall risk per Merrilee Jansky, TUG, gait velocity; met per discussion 08/27/2020    Time 4    Period Weeks    Status Achieved      PT SHORT TERM GOAL #4   Title Pt will improve SLS to at least 2 seconds each leg for improved stability for balance, gait, stairs.    Baseline SLS 1-2 sec RLE with unsteadiness; unable LLE    Time 4    Period Weeks    Status Not Met               PT Long Term Goals - 08/03/20 1647       PT LONG TERM GOAL #1   Title Pt will be independent with progression of HEP for improved strength, balance, transfers, and gait.  TARGET 10/02/2020    Baseline no current HEP    Time 9    Period Weeks    Status New      PT LONG TERM GOAL #2   Title Pt will improve 5x sit<>stand to less than or equal to 15 seconds to demonstrate improved functional strength and transfer efficiency.    Baseline 22.69 at eval    Time 9    Period Weeks    Status New      PT LONG TERM GOAL #3   Title Pt will improve Berg score to at least 35/56 to decrease fall risk.    Baseline 27/56 at eval    Time 9    Period Weeks    Status New      PT LONG TERM GOAL #4   Title Pt will improve gait velocity to at least 2.2 ft/sec for improved gait efficiency and safety.    Baseline 1.82 ft/sec    Time 9    Period Weeks    Status New      PT LONG TERM GOAL #5   Title Pt will verbalize plans for ongoing community fitness  upon d/c from PT.    Baseline no current community fitness plan    Time 9    Period Weeks    Status New                   Plan - 09/15/20 1522     Clinical Impression Statement Continued to work on trunk  and hip stability as well as standing balance exercises this visit.  With seated trunk stability exercises, he needs freqent resetting of posture due to posterior lean.  With static standing exercises, he is able to perform most without UE support but close supervision.  He will continue to benefit from skilled PT to address strenght, balance, gait towards goals.    Personal Factors and Comorbidities Comorbidity 2    Comorbidities MS June 2015 (relapsing/remitting), seizure hx    Examination-Activity Limitations Locomotion Level;Transfers;Stairs;Stand    Examination-Participation Restrictions Community Activity;Other   General fitnes   Stability/Clinical Decision Making Stable/Uncomplicated    Rehab Potential Good    PT Frequency 2x / week    PT Duration Other (comment)   9 weeks, including eval week   PT Treatment/Interventions ADLs/Self Care Home Management;Gait training;Stair training;Functional mobility training;Therapeutic activities;Therapeutic exercise;Balance training;Neuromuscular re-education;DME Instruction;Manual techniques;Orthotic Fit/Training;Patient/family education;Energy conservation    PT Next Visit Plan continue hip and trunk stregnthening with tall kneeling/quadruped or use of therapy ball with emphasis on LLE weightbearing.  Quad, glut, hamstring strengthening.  Has [facility] called to set up appt for brace?  Discuss with pt again possibility of going on hold for PT after next week (last week of scheduled appts).  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 09/15/2020, 3:27 PM Frazier Butt., PT   [facility] [address] [place], [place], [address] Phone: [phone]   Fax:  [phone]  Name: Miguel Black MRN: [id number] Date of Birth: 25-Feb-1990

## 2020-09-17 ENCOUNTER — Encounter: Payer: Self-pay | Admitting: Physical Therapy

## 2020-09-17 ENCOUNTER — Ambulatory Visit: Payer: Medicare (Managed Care) | Admitting: Physical Therapy

## 2020-09-17 ENCOUNTER — Other Ambulatory Visit: Payer: Self-pay

## 2020-09-17 DIAGNOSIS — R2681 Unsteadiness on feet: Secondary | ICD-10-CM

## 2020-09-17 DIAGNOSIS — R2689 Other abnormalities of gait and mobility: Secondary | ICD-10-CM | POA: Diagnosis not present

## 2020-09-17 DIAGNOSIS — M6281 Muscle weakness (generalized): Secondary | ICD-10-CM

## 2020-09-17 NOTE — Therapy (Signed)
[facility] [address] [place], [place], [address] Phone: [phone]   Fax:  [phone]  Physical Therapy Treatment  Patient Details  Name: Miguel Black MRN: [id number] Date of Birth: February 06, 1990 Referring Provider (PT): Arlice Colt, MD   Encounter Date: 09/17/2020   PT End of Session - 09/17/20 1318     Visit Number 9    Number of Visits 18    Date for PT Re-Evaluation 10/02/20    Authorization Type On chart:  [facility] Medicare/Medicaid    PT Start Time 1318    PT Stop Time 1400    PT Time Calculation (min) 42 min    Equipment Utilized During Treatment --    Activity Tolerance Patient tolerated treatment well    Behavior During Therapy WFL for tasks assessed/performed             Past Medical History:  Diagnosis Date   MS (multiple sclerosis) ([place])    Seizure ([place])    Status epilepticus ([place]) 07/12/2018    Past Surgical History:  Procedure Laterality Date   NO PAST SURGERIES      There were no vitals filed for this visit.   Subjective Assessment - 09/17/20 1318     Subjective Not much is new.  The exercises are keeping me busy.  Usually rotate them so I don't do them all in one day.Pt reports not yet hearing from [facility].  (PT called [facility] after session and spoke to [facility] is scheduled on 8/29 for AFO Consult with [name])    Pertinent History dx of MS June 2015 (relapsing/remitting), seizure hx    Patient Stated Goals Pt's goals for therapy are to strengthen L side and get rid of using the cane.    Currently in Pain? No/denies                               [facility] Adult PT Treatment/Exercise - 09/17/20 0001       Ambulation/Gait   Ambulation/Gait Yes    Ambulation/Gait Assistance 5: Supervision    Ambulation Distance (Feet) 60 Feet   x 4 reps   Assistive device Straight cane   round tip   Gait Pattern Step-through pattern;Decreased step length - left;Decreased  hip/knee flexion - left;Decreased dorsiflexion - left;Ataxic;Poor foot clearance - left;Left genu recurvatum    Ambulation Surface Level;Indoor    Gait velocity 17.16 sec = 1.9 ft/sec      Therapeutic Activites    Therapeutic Activities Other Therapeutic Activities      Knee/Hip Exercises: Stretches   Active Hamstring Stretch Right;Left;3 reps;20 seconds    Active Hamstring Stretch Limitations Foot propped on floor, cues for technique    Gastroc Stretch Right;Left;3 reps;20 seconds    Gastroc Stretch Limitations using gait belt to assist      Knee/Hip Exercises: Seated   Ball Squeeze 10 reps in sitting    Other Seated Knee/Hip Exercises Seated glut sets 10 reps at edge of mat                 Balance Exercises - 09/17/20 0001       Balance Exercises: Standing   Standing Eyes Opened Wide (BOA);Narrow base of support (BOS);30 secs;2 reps    Standing Eyes Closed Wide (BOA);Solid surface;3 reps;10 secs;Limitations    Standing Eyes Closed Limitations intermittent 1-2 UE support    SLS with Vectors Solid surface;Upper extremity assist 2;Limitations  SLS with Vectors Limitations Alt step taps to 4" step    Partial Tandem Stance Eyes open;Intermittent upper extremity support;2 reps;20 secs               PT Education - 09/17/20 1546     Education Details Additions to HEP-see instructions    Person(s) Educated Patient    Methods Explanation;Demonstration;Handout    Comprehension Verbalized understanding;Returned demonstration            Verbally reviewed previous HEP and pt verbalizes understanding.   PT Short Term Goals - 08/27/20 1506       PT SHORT TERM GOAL #1   Title Pt will be independent with HEP for improved strength, balance, transfers, and gait.  TARGET 08/28/2020    Baseline No current HEP; 08/27/20 independent with current HEP    Time 4    Period Weeks    Status Achieved      PT SHORT TERM GOAL #2   Title Pt will improve 5x sit<>stand to less than  or equal to 18 seconds to demonstrate improved functional strength and transfer efficiency.    Baseline 22.69 sec at eval; 23.56 08/27/20    Time 4    Period Weeks    Status Not Met      PT SHORT TERM GOAL #3   Title Pt will verbalize understanding of fall prevention in home environment.    Baseline fall risk per Merrilee Jansky, TUG, gait velocity; met per discussion 08/27/2020    Time 4    Period Weeks    Status Achieved      PT SHORT TERM GOAL #4   Title Pt will improve SLS to at least 2 seconds each leg for improved stability for balance, gait, stairs.    Baseline SLS 1-2 sec RLE with unsteadiness; unable LLE    Time 4    Period Weeks    Status Not Met               PT Long Term Goals - 08/03/20 1647       PT LONG TERM GOAL #1   Title Pt will be independent with progression of HEP for improved strength, balance, transfers, and gait.  TARGET 10/02/2020    Baseline no current HEP    Time 9    Period Weeks    Status New      PT LONG TERM GOAL #2   Title Pt will improve 5x sit<>stand to less than or equal to 15 seconds to demonstrate improved functional strength and transfer efficiency.    Baseline 22.69 at eval    Time 9    Period Weeks    Status New      PT LONG TERM GOAL #3   Title Pt will improve Berg score to at least 35/56 to decrease fall risk.    Baseline 27/56 at eval    Time 9    Period Weeks    Status New      PT LONG TERM GOAL #4   Title Pt will improve gait velocity to at least 2.2 ft/sec for improved gait efficiency and safety.    Baseline 1.82 ft/sec    Time 9    Period Weeks    Status New      PT LONG TERM GOAL #5   Title Pt will verbalize plans for ongoing community fitness upon d/c from PT.    Baseline no current community fitness plan    Time 9    Period  Weeks    Status New                   Plan - 09/17/20 1546     Clinical Impression Statement Assessed gait velocity today, with pt slightly improved to gait velocity of 1.9 ft/sec.   Updated pt's HEP to address hip strengthening in sitting as well as lower extremity stretches.  Also focused on standing balance.  Pt is in agreement to hold on PT after next week, awaiting AFO consult and getting brace (he has AFO consult scheduled for 8/29).  He is continueing to benefit from skilled PT to address strength, balance, gait stability and independence.    Personal Factors and Comorbidities Comorbidity 2    Comorbidities MS June 2015 (relapsing/remitting), seizure hx    Examination-Activity Limitations Locomotion Level;Transfers;Stairs;Stand    Examination-Participation Restrictions Community Activity;Other   General fitnes   Stability/Clinical Decision Making Stable/Uncomplicated    Rehab Potential Good    PT Frequency 2x / week    PT Duration Other (comment)   9 weeks, including eval week   PT Treatment/Interventions ADLs/Self Care Home Management;Gait training;Stair training;Functional mobility training;Therapeutic activities;Therapeutic exercise;Balance training;Neuromuscular re-education;DME Instruction;Manual techniques;Orthotic Fit/Training;Patient/family education;Energy conservation    PT Next Visit Plan Review HEP additions; ask how AFO consult went on 8/29; Next visit is 10th visit; will need to check LTGs and write progress note.  Also-consider placing pt on hold after next week awaiting AFO.    Consulted and Agree with Plan of Care Patient             Patient will benefit from skilled therapeutic intervention in order to improve the following deficits and impairments:  Abnormal gait, Decreased coordination, Difficulty walking, Impaired tone, Decreased balance, Postural dysfunction, Decreased strength, Decreased mobility  Visit Diagnosis: Muscle weakness (generalized)  Unsteadiness on feet  Other abnormalities of gait and mobility     Problem List Patient Active Problem List   Diagnosis Date Noted   History of optic neuritis 07/29/2020   Internuclear  ophthalmoplegia of right eye 07/29/2020   High risk medication use 01/28/2020   Seizure disorder ([place]) 12/04/2019   Marijuana use 12/04/2019   Seizures ([place]) 01/22/2019   Gait disturbance 01/22/2019   Left-sided weakness 01/22/2019   Other fatigue 01/22/2019   Multiple sclerosis ([place]) 07/12/2018   Encounter for screening for HIV 07/12/2018    Treacy Holcomb W. 09/17/2020, 3:51 PM Frazier Butt., PT   [facility] [address] [place], [place], [address] Phone: [phone]   Fax:  [phone]  Name: TAGEN MILBY MRN: [id number] Date of Birth: 1990/05/22

## 2020-09-17 NOTE — Patient Instructions (Addendum)
Access Code: [email or web address] URL: [email or web address] Date: 09/17/2020 Prepared by: Lonia Blood  Exercises Supine Bridge - 1 x daily - 5 x weekly - 1 sets - 10 reps Single Leg Bridge - 1 x daily - 5 x weekly - 1 sets - 10 reps Hooklying Clamshell with Resistance - 1 x daily - 5 x weekly - 1 sets - 10 reps Supine March with Resistance Band - 1 x daily - 5 x weekly - 1 sets - 10 reps Sit to/from Stand in Stride position - 1 x daily - 5 x weekly - 1 sets - 10 reps Heel Toe Raises with Counter Support - 1 x daily - 5 x weekly - 1 sets - 10 reps Squat with Chair and Counter Support - 1 x daily - 5 x weekly - 1 sets - 10 reps  09/17/2020: Seated Gluteal Sets - 1 x daily - 3 x weekly - 1-2 sets - 10 reps Seated Hip Adduction Isometrics with Ball - 1 x daily - 3 x weekly - 1-2 sets - 10 reps Seated Gastroc Stretch with Strap - 1-2 x daily - 7 x weekly - 1 sets - 3 reps - 15-30 sec hold Seated Hamstring Stretch - 1-2 x daily - 7 x weekly - 1 sets - 3 reps - 30 sec hold

## 2020-09-22 ENCOUNTER — Ambulatory Visit: Payer: Medicare (Managed Care) | Admitting: Physical Therapy

## 2020-09-22 ENCOUNTER — Encounter: Payer: Self-pay | Admitting: Physical Therapy

## 2020-09-22 ENCOUNTER — Other Ambulatory Visit: Payer: Self-pay

## 2020-09-22 DIAGNOSIS — R2689 Other abnormalities of gait and mobility: Secondary | ICD-10-CM | POA: Diagnosis not present

## 2020-09-22 DIAGNOSIS — M6281 Muscle weakness (generalized): Secondary | ICD-10-CM

## 2020-09-22 DIAGNOSIS — R2681 Unsteadiness on feet: Secondary | ICD-10-CM

## 2020-09-22 NOTE — Therapy (Signed)
[facility] [address] [place], [place], [address] Phone: [phone]   Fax:  [phone]  Physical Therapy Treatment/10th Visit Progress Note  Patient Details  Name: Miguel Black MRN: [id number] Date of Birth: 29-May-1990 Referring Provider (PT): Arlice Colt, MD   See Progress note details in clinical impression statement  Encounter Date: 09/22/2020   PT End of Session - 09/22/20 1414     Visit Number 10    Number of Visits 18    Date for PT Re-Evaluation 10/02/20    Authorization Type On chart:  [facility] Medicare/Medicaid    PT Start Time 1318    PT Stop Time 1400    PT Time Calculation (min) 42 min    Activity Tolerance Patient tolerated treatment well    Behavior During Therapy [facility] for tasks assessed/performed             Past Medical History:  Diagnosis Date   MS (multiple sclerosis) ([place])    Seizure ([place])    Status epilepticus ([place]) 07/12/2018    Past Surgical History:  Procedure Laterality Date   NO PAST SURGERIES      There were no vitals filed for this visit.   Subjective Assessment - 09/22/20 1319     Subjective Went to [employer] yesterday and met with [name]; he said I should get my Custom brace in about 2 weeks.    Pertinent History dx of MS June 2015 (relapsing/remitting), seizure hx    Patient Stated Goals Pt's goals for therapy are to strengthen L side and get rid of using the cane.    Currently in Pain? No/denies                               [facility] Adult PT Treatment/Exercise - 09/22/20 0001       Transfers   Transfers Sit to Stand;Stand to Sit    Sit to Stand 5: Supervision;With upper extremity assist;From bed;Without upper extremity assist    Five time sit to stand comments  17 sec from mat, no UEs; 22.28 sec no UEs from chair; 19.53 sec with UE support from chair    Stand to Sit 5: Supervision;With upper extremity assist;To chair/3-in-1;Without upper  extremity assist    Number of Reps Other reps (comment);Other sets (comment)   3 sets of 5 reps     Ambulation/Gait   Ambulation/Gait Yes    Ambulation/Gait Assistance 5: Supervision    Ambulation Distance (Feet) 80 Feet   120, 60 ft x 2   Assistive device Straight cane    Gait Pattern Step-through pattern;Decreased step length - left;Decreased hip/knee flexion - left;Decreased dorsiflexion - left;Ataxic;Poor foot clearance - left;Left genu recurvatum    Ambulation Surface Level;Indoor    Gait velocity 14.87 sec = 2.2 ft/sec      Standardized Balance Assessment   Standardized Balance Assessment Timed Up and Go Test;Berg Balance Test      Berg Balance Test   Sit to Stand Able to stand without using hands and stabilize independently    Standing Unsupported Able to stand safely 2 minutes    Sitting with Back Unsupported but Feet Supported on Floor or Stool Able to sit safely and securely 2 minutes    Stand to Sit Sits safely with minimal use of hands    Transfers Able to transfer safely, minor use of hands    Standing Unsupported with Eyes Closed Able to  stand 10 seconds with supervision    Standing Ubsupported with Feet Together Able to place feet together independently and stand for 1 minute with supervision    From Standing, Reach Forward with Outstretched Arm Reaches forward but needs supervision    From Standing Position, Pick up Object from [place] to pick up shoe, needs supervision    From Standing Position, Turn to Look Behind Over each Shoulder Looks behind one side only/other side shows less weight shift    Turn 360 Degrees Needs assistance while turning    Standing Unsupported, Alternately Place Feet on Step/Stool Needs assistance to keep from falling or unable to try    Standing Unsupported, One Foot in [place] to take small step independently and hold 30 seconds    Standing on One Leg Unable to try or needs assist to prevent fall    Total Score 35      Timed Up and Go  Test   TUG Normal TUG    Normal TUG (seconds) 16.72      Knee/Hip Exercises: Aerobic   Stepper Seated stepper, Level 1.5, 4 extremities x 5 minutes.  Steps/min >80; discussed benefits of returning to to his recumbent bike at home and pt agreeable to do this, replacing therapy time over the next few weeks with use of recumbent bike, 5-10 minutes to start.             Reviewed HEP additions from last visit.  Pt verbalizes understanding of glut and adductor squeezes.  He return demo understanding on LLE for hamstring stretch and gastroc stretch.  He is using green theraband at home, so provided with silver band for more stable pull for stretch; advised to use belt or sheet at home.       PT Education - 09/22/20 1414     Education Details Discussed progress towards goals, plan to hold PT until he receives AFO, then return to PT for further updates to HEP and gait training; adding back in his recumbent bike at home; pt agreeable    Person(s) Educated Patient    Methods Explanation    Comprehension Verbalized understanding              PT Short Term Goals - 08/27/20 1506       PT SHORT TERM GOAL #1   Title Pt will be independent with HEP for improved strength, balance, transfers, and gait.  TARGET 08/28/2020    Baseline No current HEP; 08/27/20 independent with current HEP    Time 4    Period Weeks    Status Achieved      PT SHORT TERM GOAL #2   Title Pt will improve 5x sit<>stand to less than or equal to 18 seconds to demonstrate improved functional strength and transfer efficiency.    Baseline 22.69 sec at eval; 23.56 08/27/20    Time 4    Period Weeks    Status Not Met      PT SHORT TERM GOAL #3   Title Pt will verbalize understanding of fall prevention in home environment.    Baseline fall risk per Merrilee Jansky, TUG, gait velocity; met per discussion 08/27/2020    Time 4    Period Weeks    Status Achieved      PT SHORT TERM GOAL #4   Title Pt will improve SLS to at least 2  seconds each leg for improved stability for balance, gait, stairs.    Baseline SLS 1-2 sec RLE with unsteadiness;  unable LLE    Time 4    Period Weeks    Status Not Met               PT Long Term Goals - 09/22/20 1415       PT LONG TERM GOAL #1   Title Pt will be independent with progression of HEP for improved strength, balance, transfers, and gait.  TARGET 10/02/2020    Baseline no current HEP; met with additions to HEP; instructions to add in recumbent bike 09/22/20    Time 9    Period Weeks    Status On-going      PT LONG TERM GOAL #2   Title Pt will improve 5x sit<>stand to less than or equal to 15 seconds to demonstrate improved functional strength and transfer efficiency.    Baseline 22.69 at eval; 19.35 sec UE support from chair    Time 9    Period Weeks    Status On-going      PT LONG TERM GOAL #3   Title Pt will improve Berg score to at least 35/56 to decrease fall risk.    Baseline 27/56 at eval; 35/56 09/22/20    Time 9    Period Weeks    Status Achieved      PT LONG TERM GOAL #4   Title Pt will improve gait velocity to at least 2.2 ft/sec for improved gait efficiency and safety.    Baseline 1.82 ft/sec; 2.2 ft/sec    Time 9    Period Weeks    Status Achieved      PT LONG TERM GOAL #5   Title Pt will verbalize plans for ongoing community fitness upon d/c from PT.    Baseline no current community fitness plan    Time 9    Period Weeks    Status On-going                   Plan - 09/22/20 1417     Clinical Impression Statement 10th Visit Progress Note, covering PT visits 08/03/20-09/22/20.  Subjective measures:  Pt feels he is moving somewhat better, but appreciates PT sessions to help push patient.  He has seen orthotist 09/21/20 and is to receive L AFO in approx 2 weeks.  Objective measures:  5x sit<>stand:  19.35 sec (improved from 23.56 sec), TUG score 17 sec; Berg score 35/56 (improved from 27/56); gait velocity 2.2 ft/sec (improved from 1.82  ft/sec).  Pt is doing HEP at home, has demo understanding of HEP updates and additions to address trunk and hip stability.  Pt is improving with functional mobility measures, and he continues to be at fall risk.  LTG 1, 2, 5 ongoing.  LTG 3 and 4 met. He will benefit from continued skilled PT (with hold from therapy visits until he gets his AFO), to further address strength, balance, gait training for improved overall functional mobility, independence and decreased fall risk.    Personal Factors and Comorbidities Comorbidity 2    Comorbidities MS June 2015 (relapsing/remitting), seizure hx    Examination-Activity Limitations Locomotion Level;Transfers;Stairs;Stand    Examination-Participation Restrictions Community Activity;Other   General fitnes   Stability/Clinical Decision Making Stable/Uncomplicated    Rehab Potential Good    PT Frequency 2x / week    PT Duration Other (comment)   9 weeks, including eval week   PT Treatment/Interventions ADLs/Self Care Home Management;Gait training;Stair training;Functional mobility training;Therapeutic activities;Therapeutic exercise;Balance training;Neuromuscular re-education;DME Instruction;Manual techniques;Orthotic Fit/Training;Patient/family education;Energy conservation  PT Next Visit Plan Pt on hold from PT until he receives AFO (he should have gone ahead and scheduled more visits today).  Update HEP as needed; gait and balance training with AFO.  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 09/22/2020, 2:23 PM Frazier Butt., PT   [facility] [address] [place], [place], [address] Phone: [phone]   Fax:  [phone]  Name: Miguel Black MRN: [id number] Date of Birth: 12/01/1990

## 2020-09-24 ENCOUNTER — Ambulatory Visit: Payer: Medicare (Managed Care) | Admitting: Physical Therapy

## 2020-10-12 ENCOUNTER — Ambulatory Visit: Payer: Medicare (Managed Care) | Attending: Neurology | Admitting: Physical Therapy

## 2020-10-15 ENCOUNTER — Ambulatory Visit: Payer: Medicare (Managed Care) | Admitting: Physical Therapy

## 2020-10-19 ENCOUNTER — Ambulatory Visit: Payer: Medicare (Managed Care) | Admitting: Physical Therapy

## 2020-10-22 ENCOUNTER — Ambulatory Visit: Payer: Medicare (Managed Care) | Admitting: Physical Therapy

## 2021-02-04 ENCOUNTER — Other Ambulatory Visit: Payer: Self-pay

## 2021-02-04 ENCOUNTER — Encounter: Payer: Self-pay | Admitting: Family Medicine

## 2021-02-04 ENCOUNTER — Ambulatory Visit (INDEPENDENT_AMBULATORY_CARE_PROVIDER_SITE_OTHER): Payer: Medicare (Managed Care) | Admitting: Family Medicine

## 2021-02-04 VITALS — BP 116/74 | HR 69 | Ht 68.0 in | Wt 161.0 lb

## 2021-02-04 DIAGNOSIS — G40909 Epilepsy, unspecified, not intractable, without status epilepticus: Secondary | ICD-10-CM

## 2021-02-04 DIAGNOSIS — R269 Unspecified abnormalities of gait and mobility: Secondary | ICD-10-CM

## 2021-02-04 DIAGNOSIS — Z79899 Other long term (current) drug therapy: Secondary | ICD-10-CM

## 2021-02-04 DIAGNOSIS — G35 Multiple sclerosis: Secondary | ICD-10-CM

## 2021-02-04 DIAGNOSIS — R5383 Other fatigue: Secondary | ICD-10-CM

## 2021-02-04 DIAGNOSIS — E559 Vitamin D deficiency, unspecified: Secondary | ICD-10-CM

## 2021-02-04 MED ORDER — AMANTADINE HCL 100 MG PO CAPS: 100.0000 mg | ORAL_CAPSULE | Freq: Two times a day (BID) | ORAL | 3 refills | Status: DC

## 2021-02-04 MED ORDER — LEVETIRACETAM 500 MG PO TABS: 500.0000 mg | ORAL_TABLET | Freq: Two times a day (BID) | ORAL | 3 refills | Status: DC

## 2021-02-04 NOTE — Progress Notes (Signed)
Chief Complaint  Patient presents with   Follow-up    Rm 2, alone. Here for 6 month MS and sz f/u. Pt reports pt feels like hes having a flareup. Having difficult days. Pt would like to talk discuss new medication to help with his walk.     HISTORY OF PRESENT ILLNESS:  02/04/21 ALL:  Miguel Black is a 31 y.o. male here today for follow up for RRMS. He continues Ocrevus infusions and tolerating well. His last infusion was 10/27/2020, next scheduled for . Labs have been stable. MRI brain did show one new focus of posterior left frontal lobe but no acute lesions. Cervical imaging stable.   He feels that he is doing fairly well. No new or exacerbating symptoms. He continues to have difficulty with balance. He reports ongoing weakness of bilateral lower extremities. Worse in left. He uses cane for long distances but not around the home. He has a stationary bike but has not used in a while. He last saw PT in 10/2020. He feels it was helpful.   Levetiracetam 500BID. He is tolerating well. No seizures. Last seizure 11/2019.   He is sleeping well. Mood is good. He does note generalized fatigue that has been worse over the past few weeks. No fever or obvious illness.   He continues vitamin D 1000iu daily OTC.    HISTORY (copied from Dr Bonnita Hollow previous note)  He is a 31 y.o. man with multiple sclerosis and seizures.  Update 07/29/2020: He reports doing well since his last visit.  He reports no new seizures and feels his neurologic status is stable with no new symptoms or exacerbations.     His last seizure was seizure 12/03/2019.  Afterwards, he remained groggy and slept 11 hours.   He returned to his baseline over the next day.    He was not on any anti-epileptic medication at the time (Keppra was prescribed but he had s.e. and did not take it long term).  He is on Keppra now 500 mg po bid.   No new seizures since then.   He feels his MS is stable.     He is on Ocrevus, last dose 04/20/20.    Next one 10/27/2020.   He feels neurologically stable.   No exacerbations or new neurologic symptoms.    He uses a cane to ambulate.  He notes some slowing of the left hand and it feels heavier.  No recent falls.  He is weaker on his left side than right.  Bladder function is fine.   He notes vision is reduced on the left and he has color desaturation out of the left eye.   His right eye has good vision.       He notes fatigue some afternoons but is good in the mornings.   Fatigue is physical > cognitive.   He sleeps well.    He feels focus and attention are good though cognitive skills sometimes are a little off.   He feels mood is fine.         MS history:  He was diagnosed with relapsing remitting MS in June 2015 after presenting with reduced vision OS while he was in [facility], [place].   In retrospect he had had some neurologic symptoms a few years before the disgnosis.   He had trouble running before his diagnosis.    He was placed on Tecfidera but he had breakthrough activity.     Just a year  later, he was switched to Tysabri and has done better.  He moved to [facility]  June 2019 and started seeing the [facility] Center at [facility] (Dr. Gaynelle Adu).   He has not had any exacerbations and feels his MS has been stable.   Initially, he continue Tysabri.  He tolerated Tysabri well but going back and forth to an infusion center monthly with difficulty.    He has been JCV Ab negative.   His last infusion was off Tysabri was in summer 2019 and he switched to Ocrevus at the end of 2019.   His last JCV Ab test I have a result for is 07/04/2017 (negative 0.14).   I saw him 12/2018 and he transferred infusions to [facility].      Seizure history:  His first one was 2018 and second one was 07/12/2018.   He notes being dehydrated the second one.    Both seizures were GTC by description with LOC.    He slept > a day after each one.   He was started on Keppra but stopped after a week or so due to side effects.  He had a third  seizure November 2021 when he was found by his dad in a postictal state.  He was placed on Keppra has continued to take it.   Imaging MRI of the brain 07/13/2018 showed multiple T2/flair hyperintense foci.  Posterior fossa foci are noted in the posterior right medulla, bilateral cerebellar hemispheres, pons, right cerebellar peduncle and cerebral peduncle.  There are foci in the thalamus and multiple foci in the periventricular, juxtacortical and deep white matter.  None of the foci appear to be acute or enhance.   Lab tests: Hepatitis B labs from 2020 at [facility] showed no evidence of chronic infection.  :  TB Gold was negative 01/22/2019.  Vitamin D 01/22/2019 was 30.1.  IgG/IgA/IgM was normal 01/22/2019.  CD19/CD20 showed less then 0.5% of lymphocytes were B cells.   REVIEW OF SYSTEMS: Out of a complete 14 system review of symptoms, the patient complains only of the following symptoms, imbalance, gait difficulty, fatigue and all other reviewed systems are negative.   ALLERGIES: No Known Allergies   HOME MEDICATIONS: Outpatient Medications Prior to Visit  Medication Sig Dispense Refill   cholecalciferol (VITAMIN D3) 25 MCG (1000 UNIT) tablet Take 1,000 Units by mouth daily.     ocrelizumab (OCREVUS) 300 MG/10ML injection Inject 20 mLs (600 mg total) into the vein every 6 (six) months. 600 mg every 6 months. 20 mL 1   levETIRAcetam (KEPPRA) 500 MG tablet Take 1 tablet (500 mg total) by mouth 2 (two) times daily. 60 tablet 11   No facility-administered medications prior to visit.     PAST MEDICAL HISTORY: Past Medical History:  Diagnosis Date   MS (multiple sclerosis) (HCC)    Seizure (HCC)    Status epilepticus (HCC) 07/12/2018     PAST SURGICAL HISTORY: Past Surgical History:  Procedure Laterality Date   NO PAST SURGERIES       FAMILY HISTORY: Family History  Problem Relation Age of Onset   Hypertension Mother    Healthy Father    Healthy Sister    Healthy Brother     Diabetes Maternal Grandmother    Hypertension Maternal Grandfather    Hodgkin's lymphoma Brother    Colon cancer Maternal Uncle 45     SOCIAL HISTORY: Social History   Socioeconomic History   Marital status: Unknown    Spouse name: Not  on file   Number of children: 0   Years of education: 11   Highest education level: Not on file  Occupational History   Occupation: Unemployed  Tobacco Use   Smoking status: Some Days    Types: Cigars   Smokeless tobacco: Never   Tobacco comments:    2.5-3 per day  Vaping Use   Vaping Use: Never used  Substance and Sexual Activity   Alcohol use: Yes    Comment: sometimes    Drug use: Yes    Types: Marijuana    Comment: Reports he smokes a lot, cannot put a number to it   Sexual activity: Not on file  Other Topics Concern   Not on file  Social History Narrative   Left handed   Coffee every day   Lives with family    Social Determinants of Health   Financial Resource Strain: Not on file  Food Insecurity: Not on file  Transportation Needs: Not on file  Physical Activity: Not on file  Stress: Not on file  Social Connections: Not on file  Intimate Partner Violence: Not on file     PHYSICAL EXAM  Vitals:   02/04/21 1308  BP: 116/74  Pulse: 69  SpO2: 96%  Weight: 161 lb (73 kg)  Height:  (1.727 m)   Body mass index is 24.48 kg/m.  Generalized: Well developed, in no acute distress  Cardiology: normal rate and rhythm, no murmur auscultated  Respiratory: clear to auscultation bilaterally    Neurological examination  Mentation: Alert oriented to time, place, history taking. Follows all commands speech and language fluent Cranial nerve II-XII: Pupils were equal round reactive to light. Extraocular movements were full, visual field were full on confrontational test. Facial sensation and strength were normal. Head turning and shoulder shrug  were normal and symmetric. Motor: The motor testing reveals 5 over 5 strength  of all 4 extremities. Good symmetric motor tone is noted throughout.  Sensory: Sensory testing is intact to soft touch on all 4 extremities. No evidence of extinction is noted.  Coordination: Cerebellar testing reveals ataxic finger-nose-finger and heel-to-shin bilaterally.  Gait and station: Gait is spastic. Uses cane. Unable to Tandem Reflexes: Deep tendon reflexes are symmetric and normal bilaterally.    DIAGNOSTIC DATA (LABS, IMAGING, TESTING) - I reviewed patient records, labs, notes, testing and imaging myself where available.  Lab Results  Component Value Date   WBC 5.4 07/29/2020   HGB 16.5 07/29/2020   HCT 48.8 07/29/2020   MCV 96 07/29/2020   PLT 245 07/29/2020      Component Value Date/Time   NA 140 12/05/2019 0804   NA 140 07/22/2019 1502   K 3.7 12/05/2019 0804   CL 108 12/05/2019 0804   CO2 19 (L) 12/05/2019 0804   GLUCOSE 85 12/05/2019 0804   BUN 9 12/05/2019 0804   BUN 12 07/22/2019 1502   CREATININE 1.16 12/05/2019 0804   CALCIUM 8.3 (L) 12/05/2019 0804   PROT 6.7 12/04/2019 0725   PROT 7.2 07/22/2019 1502   ALBUMIN 3.7 12/04/2019 0725   ALBUMIN 4.9 07/22/2019 1502   AST 38 12/04/2019 0725   ALT 18 12/04/2019 0725   ALKPHOS 55 12/04/2019 0725   BILITOT 0.6 12/04/2019 0725   BILITOT <0.2 07/22/2019 1502   GFRNONAA >60 12/05/2019 0804   GFRAA 99 07/22/2019 1502   No results found for: CHOL, HDL, LDLCALC, LDLDIRECT, TRIG, CHOLHDL No results found for: ZOXW9U No results found for: VITAMINB12 Lab Results  Component Value Date   TSH 0.625 01/22/2019    No flowsheet data found.   No flowsheet data found.   ASSESSMENT AND PLAN  30 y.o. year old male  has a past medical history of MS (multiple sclerosis) (HCC), Seizure (HCC), and Status epilepticus (HCC) (07/12/2018). here with    Relapsing remitting multiple sclerosis (HCC) - Plan: CBC with Differential/Platelets, IgG, IgA, IgM  High risk medication use - Plan: CBC with Differential/Platelets,  IgG, IgA, IgM  Seizure disorder (HCC)  Gait disturbance  Vitamin D deficiency - Plan: Vitamin D, 25-hydroxy  Other fatigue  Endi is doing well. We will continue Ocrevus infusions biannually. We will update labs, today. He will continue levetiracetam 500mg  BID. May adjust vitamin D level pending labs. We will start amantadine 100mg  BID for MS fatigue and spastic gait. He was educated on appropraite administration and possible side effects. He will call with any concerns. He was encouraged to continue healthy lifestyle habits. He will follow up with Dr in 6 months, sooner if needed.    Orders Placed This Encounter  Procedures   CBC with Differential/Platelets   IgG, IgA, IgM   Vitamin D, 25-hydroxy     Meds ordered this encounter  Medications   levETIRAcetam (KEPPRA) 500 MG tablet    Sig: Take 1 tablet (500 mg total) by mouth 2 (two) times daily.    Dispense:  120 tablet    Refill:  3    Order Specific Question:   Supervising Provider    Answer:   Epimenio Foot   amantadine (SYMMETREL) 100 MG capsule    Sig: Take 1 capsule (100 mg total) by mouth 2 (two) times daily.    Dispense:  180 capsule    Refill:  3    Order Specific Question:   Supervising Provider    Answer:   Anson Fret [[id number]]      Anson Fret, MSN, FNP-C 02/04/2021, 2:11 PM  [facility] [address], [address] [place], [address] [place] [phone]

## 2021-02-04 NOTE — Patient Instructions (Signed)
Below is our plan:  We will continue Ocrevus infusions every 6 months. We will continue levetiracetam 500mg  twice daily for seizure management. I will talk to Dr Felecia Shelling about adding amantadine. I will check labs, today.   Please make sure you are staying well hydrated. I recommend 50-60 ounces daily. Well balanced diet and regular exercise encouraged. Consistent sleep schedule with 6-8 hours recommended.   Please continue follow up with care team as directed.   Follow up with Dr Felecia Shelling in 6 months   You may receive a survey regarding today's visit. I encourage you to leave honest feed back as I do use this information to improve patient care. Thank you for seeing me today!

## 2021-02-05 LAB — IGG, IGA, IGM
IgA/Immunoglobulin A, Serum: 117 mg/dL (ref 90–386)
IgG (Immunoglobin G), Serum: 1182 mg/dL (ref 603–1613)
IgM (Immunoglobulin M), Srm: 24 mg/dL (ref 20–172)

## 2021-02-05 LAB — CBC WITH DIFFERENTIAL/PLATELET
Basophils Absolute: 0 10*3/uL (ref 0.0–0.2)
Basos: 1 %
EOS (ABSOLUTE): 0.1 10*3/uL (ref 0.0–0.4)
Eos: 1 %
Hematocrit: 45.9 % (ref 37.5–51.0)
Hemoglobin: 15.6 g/dL (ref 13.0–17.7)
Immature Grans (Abs): 0.1 10*3/uL (ref 0.0–0.1)
Immature Granulocytes: 2 %
Lymphocytes Absolute: 1.7 10*3/uL (ref 0.7–3.1)
Lymphs: 31 %
MCH: 31.5 pg (ref 26.6–33.0)
MCHC: 34 g/dL (ref 31.5–35.7)
MCV: 93 fL (ref 79–97)
Monocytes Absolute: 0.7 10*3/uL (ref 0.1–0.9)
Monocytes: 12 %
Neutrophils Absolute: 3 10*3/uL (ref 1.4–7.0)
Neutrophils: 53 %
Platelets: 290 10*3/uL (ref 150–450)
RBC: 4.96 x10E6/uL (ref 4.14–5.80)
RDW: 12.8 % (ref 11.6–15.4)
WBC: 5.6 10*3/uL (ref 3.4–10.8)

## 2021-02-05 LAB — VITAMIN D 25 HYDROXY (VIT D DEFICIENCY, FRACTURES): Vit D, 25-Hydroxy: 21.1 ng/mL — ABNORMAL LOW (ref 30.0–100.0)

## 2021-02-08 ENCOUNTER — Telehealth: Payer: Self-pay | Admitting: *Deleted

## 2021-02-08 DIAGNOSIS — R269 Unspecified abnormalities of gait and mobility: Secondary | ICD-10-CM

## 2021-02-08 DIAGNOSIS — G35 Multiple sclerosis: Secondary | ICD-10-CM

## 2021-02-08 MED ORDER — DALFAMPRIDINE ER 10 MG PO TB12: 10.0000 mg | ORAL_TABLET | Freq: Two times a day (BID) | ORAL | 11 refills | Status: DC

## 2021-02-08 NOTE — Telephone Encounter (Signed)
Called pt. Relayed results per AL,NP note. Pt verbalized understanding. He asked if Dr. Salley Slaughter can write for cannabis card. Advised Dr. Epimenio Foot would not be able to do this.  He also asked about rx dalfampridine. States AL,NP was going to discuss w/ MD. I placed on hold and spoke with Dr. Epimenio Foot. MD approved calling this in. Reviewed BMP from 11/2019. Creatinine ok. E-scribed to [facility] Provided pt phone#[phone]. He is aware rx may require PA via insurance. He will call back if he has any other questions/concerns moving forward.

## 2021-02-08 NOTE — Telephone Encounter (Signed)
-----   Message from Shawnie Dapper, NP sent at 02/08/2021  9:41 AM EST ----- Please let Miguel Black know that everything looks ok with his blood work with the exception of his vitamin D level. It is a little low. I would like for him to increase his dose of OTC vitamin D from 1000iu to 5000iu daily. We can recheck levels at next visit.

## 2021-02-23 ENCOUNTER — Telehealth: Payer: Self-pay

## 2021-02-23 NOTE — Telephone Encounter (Signed)
I have submitted a PA request for dalfampridine ER 10mg  on CMM, Key: [id number] - PA Case ID: . Awaiting determination from [facility].

## 2021-03-02 NOTE — Telephone Encounter (Signed)
Approved. This drug has been approved under the Member's Medicare Part D benefit. Approved quantity: 60 tablets per 30 day(s). You may fill up to a 90 day supply except for those on Specialty Tier 5, which can be filled up to a 30 day supply.

## 2021-03-07 ENCOUNTER — Emergency Department (HOSPITAL_COMMUNITY): Payer: Medicare (Managed Care)

## 2021-03-07 ENCOUNTER — Inpatient Hospital Stay (HOSPITAL_COMMUNITY)
Admission: EM | Admit: 2021-03-07 | Discharge: 2021-03-10 | DRG: 100 | Disposition: A | Discharge status: Home / Self Care | Payer: Medicare (Managed Care) | Attending: Internal Medicine | Admitting: Internal Medicine

## 2021-03-07 DIAGNOSIS — G35 Multiple sclerosis: Secondary | ICD-10-CM | POA: Diagnosis present

## 2021-03-07 DIAGNOSIS — J9601 Acute respiratory failure with hypoxia: Secondary | ICD-10-CM | POA: Diagnosis present

## 2021-03-07 DIAGNOSIS — U071 COVID-19: Secondary | ICD-10-CM | POA: Diagnosis present

## 2021-03-07 DIAGNOSIS — G9341 Metabolic encephalopathy: Secondary | ICD-10-CM | POA: Diagnosis present

## 2021-03-07 DIAGNOSIS — J9690 Respiratory failure, unspecified, unspecified whether with hypoxia or hypercapnia: Secondary | ICD-10-CM

## 2021-03-07 DIAGNOSIS — R569 Unspecified convulsions: Secondary | ICD-10-CM

## 2021-03-07 DIAGNOSIS — F17290 Nicotine dependence, other tobacco product, uncomplicated: Secondary | ICD-10-CM | POA: Diagnosis present

## 2021-03-07 DIAGNOSIS — R001 Bradycardia, unspecified: Secondary | ICD-10-CM | POA: Diagnosis present

## 2021-03-07 DIAGNOSIS — G40901 Epilepsy, unspecified, not intractable, with status epilepticus: Principal | ICD-10-CM | POA: Diagnosis present

## 2021-03-07 DIAGNOSIS — R531 Weakness: Secondary | ICD-10-CM

## 2021-03-07 DIAGNOSIS — E162 Hypoglycemia, unspecified: Secondary | ICD-10-CM | POA: Diagnosis not present

## 2021-03-07 DIAGNOSIS — Z79899 Other long term (current) drug therapy: Secondary | ICD-10-CM

## 2021-03-07 DIAGNOSIS — G9340 Encephalopathy, unspecified: Secondary | ICD-10-CM | POA: Diagnosis not present

## 2021-03-07 DIAGNOSIS — E876 Hypokalemia: Secondary | ICD-10-CM | POA: Diagnosis present

## 2021-03-07 DIAGNOSIS — R269 Unspecified abnormalities of gait and mobility: Secondary | ICD-10-CM

## 2021-03-07 LAB — CBC WITH DIFFERENTIAL/PLATELET
Abs Immature Granulocytes: 0.02 10*3/uL (ref 0.00–0.07)
Basophils Absolute: 0.1 10*3/uL (ref 0.0–0.1)
Basophils Relative: 1 %
Eosinophils Absolute: 0.1 10*3/uL (ref 0.0–0.5)
Eosinophils Relative: 1 %
HCT: 55.1 % — ABNORMAL HIGH (ref 39.0–52.0)
Hemoglobin: 16.9 g/dL (ref 13.0–17.0)
Immature Granulocytes: 0 %
Lymphocytes Relative: 31 %
Lymphs Abs: 2.5 10*3/uL (ref 0.7–4.0)
MCH: 32.4 pg (ref 26.0–34.0)
MCHC: 30.7 g/dL (ref 30.0–36.0)
MCV: 105.8 fL — ABNORMAL HIGH (ref 80.0–100.0)
Monocytes Absolute: 0.5 10*3/uL (ref 0.1–1.0)
Monocytes Relative: 7 %
Neutro Abs: 4.8 10*3/uL (ref 1.7–7.7)
Neutrophils Relative %: 60 %
Platelets: 217 10*3/uL (ref 150–400)
RBC: 5.21 MIL/uL (ref 4.22–5.81)
RDW: 13.4 % (ref 11.5–15.5)
WBC: 8 10*3/uL (ref 4.0–10.5)
nRBC: 0 % (ref 0.0–0.2)

## 2021-03-07 LAB — ETHANOL: Alcohol, Ethyl (B): <10 mg/dL (ref <10)

## 2021-03-07 LAB — RAPID URINE DRUG SCREEN, HOSP PERFORMED
Amphetamines: NOT DETECTED
Barbiturates: NOT DETECTED
Benzodiazepines: POSITIVE — AB
Cocaine: NOT DETECTED
Opiates: NOT DETECTED
Tetrahydrocannabinol: POSITIVE — AB

## 2021-03-07 LAB — COMPREHENSIVE METABOLIC PANEL
ALT: 15 U/L (ref 0–44)
AST: 27 U/L (ref 15–41)
Albumin: 4.6 g/dL (ref 3.5–5.0)
Alkaline Phosphatase: 68 U/L (ref 38–126)
Anion gap: 15 (ref 5–15)
BUN: 10 mg/dL (ref 6–20)
CO2: 20 mmol/L — ABNORMAL LOW (ref 22–32)
Calcium: 8.8 mg/dL — ABNORMAL LOW (ref 8.9–10.3)
Chloride: 103 mmol/L (ref 98–111)
Creatinine, Ser: 1.18 mg/dL (ref 0.61–1.24)
GFR, Estimated: >60 mL/min (ref >60)
Glucose, Bld: 116 mg/dL — ABNORMAL HIGH (ref 70–99)
Potassium: 4.7 mmol/L (ref 3.5–5.1)
Sodium: 138 mmol/L (ref 135–145)
Total Bilirubin: 0.8 mg/dL (ref 0.3–1.2)
Total Protein: 7.7 g/dL (ref 6.5–8.1)

## 2021-03-07 LAB — I-STAT ARTERIAL BLOOD GAS, ED
Acid-base deficit: 2 mmol/L (ref 0.0–2.0)
Bicarbonate: 24.4 mmol/L (ref 20.0–28.0)
Calcium, Ion: 1.13 mmol/L — ABNORMAL LOW (ref 1.15–1.40)
HCT: 41 % (ref 39.0–52.0)
Hemoglobin: 13.9 g/dL (ref 13.0–17.0)
O2 Saturation: 100 %
Patient temperature: 98.6
Potassium: 4 mmol/L (ref 3.5–5.1)
Sodium: 139 mmol/L (ref 135–145)
TCO2: 26 mmol/L (ref 22–32)
pCO2 arterial: 47.5 mmHg (ref 32.0–48.0)
pH, Arterial: 7.318 — ABNORMAL LOW (ref 7.350–7.450)
pO2, Arterial: 194 mmHg — ABNORMAL HIGH (ref 83.0–108.0)

## 2021-03-07 LAB — RESP PANEL BY RT-PCR (FLU A&B, COVID) ARPGX2
Influenza A by PCR: NEGATIVE
Influenza B by PCR: NEGATIVE
SARS Coronavirus 2 by RT PCR: POSITIVE — AB

## 2021-03-07 LAB — MRSA NEXT GEN BY PCR, NASAL: MRSA by PCR Next Gen: NOT DETECTED

## 2021-03-07 MED ORDER — CHLORHEXIDINE GLUCONATE 0.12% ORAL RINSE (MEDLINE KIT)
15.0000 mL | Freq: Two times a day (BID) | OROMUCOSAL | Status: DC
  Administered 2021-03-07 – 2021-03-08 (×2): 15 mL via OROMUCOSAL

## 2021-03-07 MED ORDER — FENTANYL 2500MCG IN NS 250ML (10MCG/ML) PREMIX INFUSION
25.0000 ug/h | INTRAVENOUS | Status: DC
  Administered 2021-03-07: 100 ug/h via INTRAVENOUS
  Administered 2021-03-07: 200 ug/h via INTRAVENOUS
  Administered 2021-03-08: 150 ug/h via INTRAVENOUS
  Filled 2021-03-07 (×3): qty 250

## 2021-03-07 MED ORDER — LORAZEPAM 2 MG/ML IJ SOLN
INTRAMUSCULAR | Status: AC | PRN
  Administered 2021-03-07: 2 mg via INTRAVENOUS

## 2021-03-07 MED ORDER — ATROPINE SULFATE 1 MG/10ML IJ SOSY
1.0000 mg | PREFILLED_SYRINGE | INTRAMUSCULAR | Status: DC | PRN
  Filled 2021-03-07: qty 10

## 2021-03-07 MED ORDER — FENTANYL BOLUS VIA INFUSION
50.0000 ug | INTRAVENOUS | Status: DC | PRN
  Filled 2021-03-07: qty 50

## 2021-03-07 MED ORDER — ORAL CARE MOUTH RINSE
15.0000 mL | OROMUCOSAL | Status: DC
  Administered 2021-03-07 – 2021-03-08 (×8): 15 mL via OROMUCOSAL

## 2021-03-07 MED ORDER — DOCUSATE SODIUM 50 MG/5ML PO LIQD: 100.0000 mg | Freq: Two times a day (BID) | ORAL | Status: DC | PRN

## 2021-03-07 MED ORDER — POLYETHYLENE GLYCOL 3350 17 G PO PACK: 17.0000 g | PACK | Freq: Every day | ORAL | Status: DC | PRN

## 2021-03-07 MED ORDER — FENTANYL CITRATE PF 50 MCG/ML IJ SOSY
PREFILLED_SYRINGE | INTRAMUSCULAR | Status: AC
  Filled 2021-03-07: qty 2

## 2021-03-07 MED ORDER — LEVETIRACETAM IN NACL 1000 MG/100ML IV SOLN
1000.0000 mg | Freq: Two times a day (BID) | INTRAVENOUS | Status: AC
  Administered 2021-03-07 – 2021-03-09 (×4): 1000 mg via INTRAVENOUS
  Filled 2021-03-07 (×4): qty 100

## 2021-03-07 MED ORDER — SUCCINYLCHOLINE CHLORIDE 200 MG/10ML IV SOSY
PREFILLED_SYRINGE | INTRAVENOUS | Status: AC
  Filled 2021-03-07: qty 10

## 2021-03-07 MED ORDER — PROPOFOL 1000 MG/100ML IV EMUL
0.0000 ug/kg/min | INTRAVENOUS | Status: DC
  Administered 2021-03-07: 20 ug/kg/min via INTRAVENOUS
  Administered 2021-03-07: 30 ug/kg/min via INTRAVENOUS
  Administered 2021-03-08: 15 ug/kg/min via INTRAVENOUS
  Filled 2021-03-07 (×3): qty 100

## 2021-03-07 MED ORDER — LACTATED RINGERS IV BOLUS
1000.0000 mL | Freq: Once | INTRAVENOUS | Status: AC
  Administered 2021-03-07: 1000 mL via INTRAVENOUS

## 2021-03-07 MED ORDER — PROPOFOL 1000 MG/100ML IV EMUL: 0.0000 ug/kg/min | INTRAVENOUS | Status: DC

## 2021-03-07 MED ORDER — LORAZEPAM 2 MG/ML IJ SOLN
INTRAMUSCULAR | Status: AC
  Filled 2021-03-07: qty 1

## 2021-03-07 MED ORDER — ETOMIDATE 2 MG/ML IV SOLN
INTRAVENOUS | Status: AC | PRN
  Administered 2021-03-07: 20 mg via INTRAVENOUS

## 2021-03-07 MED ORDER — MIDAZOLAM HCL 2 MG/2ML IJ SOLN
INTRAMUSCULAR | Status: AC
  Filled 2021-03-07: qty 2

## 2021-03-07 MED ORDER — ROCURONIUM BROMIDE 10 MG/ML (PF) SYRINGE
PREFILLED_SYRINGE | INTRAVENOUS | Status: AC
  Filled 2021-03-07: qty 10

## 2021-03-07 MED ORDER — LORAZEPAM 2 MG/ML IJ SOLN
4.0000 mg | INTRAMUSCULAR | Status: DC | PRN
  Administered 2021-03-07: 4 mg via INTRAVENOUS
  Filled 2021-03-07: qty 2

## 2021-03-07 MED ORDER — SUCCINYLCHOLINE CHLORIDE 20 MG/ML IJ SOLN
INTRAMUSCULAR | Status: AC | PRN
  Administered 2021-03-07: 100 mg via INTRAVENOUS

## 2021-03-07 MED ORDER — SODIUM CHLORIDE 0.9 % IV BOLUS
1000.0000 mL | Freq: Once | INTRAVENOUS | Status: AC
  Administered 2021-03-07: 1000 mL via INTRAVENOUS

## 2021-03-07 MED ORDER — HEPARIN SODIUM (PORCINE) 5000 UNIT/ML IJ SOLN
5000.0000 [IU] | Freq: Three times a day (TID) | INTRAMUSCULAR | Status: DC
  Administered 2021-03-07 – 2021-03-09 (×6): 5000 [IU] via SUBCUTANEOUS
  Filled 2021-03-07 (×6): qty 1

## 2021-03-07 MED ORDER — SODIUM CHLORIDE 0.9 % IV SOLN: INTRAVENOUS | Status: DC

## 2021-03-07 MED ORDER — PROPOFOL 1000 MG/100ML IV EMUL
INTRAVENOUS | Status: AC
  Administered 2021-03-07: 50 ug/kg/min via INTRAVENOUS
  Filled 2021-03-07: qty 100

## 2021-03-07 MED ORDER — KETAMINE HCL 50 MG/5ML IJ SOSY
PREFILLED_SYRINGE | INTRAMUSCULAR | Status: AC
  Filled 2021-03-07: qty 5

## 2021-03-07 MED ORDER — FENTANYL CITRATE PF 50 MCG/ML IJ SOSY
50.0000 ug | PREFILLED_SYRINGE | Freq: Once | INTRAMUSCULAR | Status: AC
  Administered 2021-03-07: 50 ug via INTRAVENOUS
  Filled 2021-03-07: qty 1

## 2021-03-07 MED ORDER — CHLORHEXIDINE GLUCONATE CLOTH 2 % EX PADS
6.0000 | MEDICATED_PAD | Freq: Every day | CUTANEOUS | Status: DC
  Administered 2021-03-07 – 2021-03-09 (×3): 6 via TOPICAL

## 2021-03-07 MED ORDER — ETOMIDATE 2 MG/ML IV SOLN
INTRAVENOUS | Status: AC
  Filled 2021-03-07: qty 20

## 2021-03-07 MED ORDER — LEVETIRACETAM IN NACL 500 MG/100ML IV SOLN
500.0000 mg | Freq: Once | INTRAVENOUS | Status: AC
  Administered 2021-03-07: 500 mg via INTRAVENOUS
  Filled 2021-03-07: qty 100

## 2021-03-07 NOTE — Progress Notes (Signed)
Pt arrived from ED with belongings listed below:   03/07/21 1335  Patient Belongings  Patient/Family advised about valuables policy? No (comment) (Unable to advise at this time due to sedation)  Home Medications No meds brought to hospital  Patient Belongings Kept at bedside  Belongings at Bedside Clothing;Electronic device(s)  Bedside: Electronic Device(s) Cellphone

## 2021-03-07 NOTE — Progress Notes (Signed)
LTM EEG hooked up and running - no initial skin breakdown -Patient is in the ED right now. Cannot notify [facility] until patient moves upstairs.

## 2021-03-07 NOTE — ED Triage Notes (Signed)
Pt recently started amantadine and dalfampridine for MS. EMS reports pt is also a heavy THC user. Tonic clonic seizure and Midazolam 5mg  IM given. Hx of seizures. Pt is now lethargic and unable to follow commands.

## 2021-03-07 NOTE — H&P (Signed)
NAME:  Miguel Black, MRN:  [id number], DOB:  11/11/1990, LOS: 0 ADMISSION DATE:  03/07/2021, CONSULTATION DATE:  03/07/2021 REFERRING MD:  Dr. Lynelle Doctor, CHIEF COMPLAINT:  Seizure    History of Present Illness:  Miguel Black is a 31 y.o. male with a PMH significant for MS and known seizure discharge and prior status epilepticus who presented to the ED via EMS for AMS and seizures. Per report patient was seen at home today smoking marijuana when he later became unresponisve . Witnessed tonic-clonic seizure with EMS for which he was given midazolam and later required BMV in route to ED.   On ED arrival patient was seen with continues seizure activity and he was intubated for airway protection. Head CT was unremarkable. Neurotology was consulted and PCCM was called for further management and admission.   Pertinent  Medical History  MS and known seizure discharge and prior status epilepticus  Significant Hospital Events: Including procedures, antibiotic start and stop dates in addition to other pertinent events   2/12 admitted for seizures  Interim History / Subjective:  As above   Objective   Blood pressure 118/88, pulse (!) 44, temperature 97.6 F (36.4 C), temperature source Rectal, resp. rate 20, height 5\' 8"  (1.727 m), SpO2 100 %.    Vent Mode: PRVC FiO2 (%):  [30 %-40 %] 30 % Set Rate:  [20 bmp] 20 bmp Vt Set:  [550 mL] 550 mL PEEP:  [5 cmH20] 5 cmH20 Plateau Pressure:  [12 cmH20-14 cmH20] 12 cmH20   Intake/Output Summary (Last 24 hours) at 03/07/2021 1232 Last data filed at 03/07/2021 1044 Gross per 24 hour  Intake 1100 ml  Output --  Net 1100 ml   There were no vitals filed for this visit.  Examination: General: Well developed adult male lying in bed deeply sedated on vent  HEENT: ETT, MM pink/moist, PERRL,  Neuro: Deeply sedated on propofol and fentanyl  CV: s1s2 regular rate and rhythm, no murmur, rubs, or gallops,  PULM:  Clear to ascultation bilaterally, no  increased work of breahting, no added breath sounds GI: soft, bowel sounds active in all 4 quadrants, non-tender, non-distended, tolerating TF Extremities: warm/dry, no edema  Skin: no rashes or lesions  Resolved Hospital Problem list     Assessment & Plan:  Status epilepticus  Hx of MS  -Known seizure hx on home Keppra, amantadine, dalfampridine, and ocrelizumab  P: Primary management per neurology  Discontinue amantadine per neuro  Continue but increase Keppra EEG Maintain neuro protective measures; goal for eurothermia, euglycemia, eunatermia, normoxia, and PCO2 goal of 35-40 Nutrition and bowel regiment  Seizure precautions  Aspirations precautions   Acute Hypoxic Respiratory Failure  -Secondary to above P: Continue ventilator support with lung protective strategies  Wean PEEP and FiO2 for sats greater than 90%. Head of bed elevated 30 degrees. Plateau pressures less than 30 cm H20.  Follow intermittent chest x-ray and ABG.   SAT/SBT as tolerated, mentation preclude extubation  Ensure adequate pulmonary hygiene  Follow cultures  VAP bundle in place  PAD protocol  Hypocalcemia  P: Supplement     Best Practice (right click and "Reselect all SmartList Selections" daily)   Diet/type: NPO DVT prophylaxis: prophylactic heparin  GI prophylaxis: PPI Lines: N/A Foley:  N/A Code Status:  full code Last date of multidisciplinary goals of care discussion: Pending   Labs   CBC: Recent Labs  Lab 03/07/21 0930 03/07/21 1048  WBC 8.0  --   NEUTROABS 4.8  --  HGB 16.9 13.9  HCT 55.1* 41.0  MCV 105.8*  --   PLT 217  --     Basic Metabolic Panel: Recent Labs  Lab 03/07/21 0930 03/07/21 1048  NA 138 139  K 4.7 4.0  CL 103  --   CO2 20*  --   GLUCOSE 116*  --   BUN 10  --   CREATININE 1.18  --   CALCIUM 8.8*  --    GFR: CrCl cannot be calculated (Unknown ideal weight.). Recent Labs  Lab 03/07/21 0930  WBC 8.0    Liver Function Tests: Recent  Labs  Lab 03/07/21 0930  AST 27  ALT 15  ALKPHOS 68  BILITOT 0.8  PROT 7.7  ALBUMIN 4.6   No results for input(s): LIPASE, AMYLASE in the last 168 hours. No results for input(s): AMMONIA in the last 168 hours.  ABG    Component Value Date/Time   PHART 7.318 (L) 03/07/2021 1048   PCO2ART 47.5 03/07/2021 1048   PO2ART 194 (H) 03/07/2021 1048   HCO3 24.4 03/07/2021 1048   TCO2 26 03/07/2021 1048   ACIDBASEDEF 2.0 03/07/2021 1048   O2SAT 100.0 03/07/2021 1048     Coagulation Profile: No results for input(s): INR, PROTIME in the last 168 hours.  Cardiac Enzymes: No results for input(s): CKTOTAL, CKMB, CKMBINDEX, TROPONINI in the last 168 hours.  HbA1C: No results found for: HGBA1C  CBG: No results for input(s): GLUCAP in the last 168 hours.  Review of Systems:   Unable to assess   Past Medical History:  He,  has a past medical history of MS (multiple sclerosis) (HCC), Seizure (HCC), and Status epilepticus (HCC) (07/12/2018).   Surgical History:   Past Surgical History:  Procedure Laterality Date   NO PAST SURGERIES       Social History:   reports that he has been smoking cigars. He has never used smokeless tobacco. He reports current alcohol use. He reports current drug use. Drug: Marijuana.   Family History:  His family history includes Colon cancer (age of onset: 34) in his maternal uncle; Diabetes in his maternal grandmother; Healthy in his brother, father, and sister; Hodgkin's lymphoma in his brother; Hypertension in his maternal grandfather and mother.   Allergies No Known Allergies   Home Medications  Prior to Admission medications   Medication Sig Start Date End Date Taking? Authorizing Provider  amantadine (SYMMETREL) 100 MG capsule Take 1 capsule (100 mg total) by mouth 2 (two) times daily. 02/04/21   Lomax, Amy, NP  cholecalciferol (VITAMIN D3) 25 MCG (1000 UNIT) tablet Take 5,000 Units by mouth daily.    [provider]  dalfampridine 10  MG TB12 Take 1 tablet (10 mg total) by mouth 2 (two) times daily. 02/08/21   Sater, Pearletha Furl, MD  levETIRAcetam (KEPPRA) 500 MG tablet Take 1 tablet (500 mg total) by mouth 2 (two) times daily. 02/04/21   Lomax, Amy, NP  ocrelizumab (OCREVUS) 300 MG/10ML injection Inject 20 mLs (600 mg total) into the vein every 6 (six) months. 600 mg every 6 months. 03/25/20   Sater, Pearletha Furl, MD     Critical care time:    CRITICAL CARE Performed by: Niana Martorana D. Harris  Total critical care time: 45 minutes  Critical care time was exclusive of separately billable procedures and treating other patients.  Critical care was necessary to treat or prevent imminent or life-threatening deterioration.  Critical care was time spent personally by me on the following activities: development  of treatment plan with patient and/or surrogate as well as nursing, discussions with consultants, evaluation of patient's response to treatment, examination of patient, obtaining history from patient or surrogate, ordering and performing treatments and interventions, ordering and review of laboratory studies, ordering and review of radiographic studies, pulse oximetry and re-evaluation of patient's condition.  Afra Tricarico D. Tiburcio Pea, NP-C [facility] Personal contact information can be found on Amion  03/07/2021, 1:25 PM

## 2021-03-07 NOTE — Progress Notes (Signed)
Discussed lack of response to peripheral painful stimuli with Whitney CCM NP. Per Whitney keep sedation at current level and monitor for worsening in neurologic status, notifying neurology as needed. Goal to continue weaning sedation tomorrow morning.    03/07/21 1800  Neurological  Level of Consciousness Responds to Pain (Grimaces in response to nasal stimuli, but no response to peripheral pain stimuli)  Neuro (WDL) X  Orientation Level Intubated/Tracheostomy - Unable to assess  Cognition Unable to follow commands  Speech Intubated/Tracheostomy - Unable to assess  Pupil Assessment  Yes  R Pupil Size (mm) 3  R Pupil Shape Round  R Pupil Reaction Sluggish  L Pupil Size (mm) 3  L Pupil Shape Round  L Pupil Reaction Sluggish  Additional Pupil Assessments No  Motor Function/Sensation Assessment Motor response;Motor strength  RUE Motor Response No movement to painful stimulus  RUE Motor Strength 0  LUE Motor Response No movement to painful stimulus  LUE Motor Strength 0  RLE Motor Response No movement to painful stimulus  RLE Motor Strength 0  LLE Motor Response No movement to painful stimulus  LLE Motor Strength 0

## 2021-03-07 NOTE — Consult Note (Signed)
Neurology Consultation Reason for Consult: Status epilepticus Referring Physician: Roselyn Bering  CC: Status epilepticus  History is obtained from: Chart review  HPI: Miguel Black is a 31 y.o. male with a history of MS as well as seizure disorder who presents with status epilepticus.  He was seen at the seizure by his family member and EMS was called.  On route with EMS, he had a recurrent seizure and was given IM Versed.  On arrival to the emergency department he continued to have gaze deviation and decreased mental status requiring intubation.  He has been given a gram of IV Keppra (on 500 twice daily at home) as well as started on propofol with cessation of any clinical seizure activity.    ROS: Unable to obtain due to altered mental status.   Past Medical History:  Diagnosis Date   MS (multiple sclerosis) (HCC)    Seizure (HCC)    Status epilepticus (HCC) 07/12/2018     Family History  Problem Relation Age of Onset   Hypertension Mother    Healthy Father    Healthy Sister    Healthy Brother    Diabetes Maternal Grandmother    Hypertension Maternal Grandfather    Hodgkin's lymphoma Brother    Colon cancer Maternal Uncle 45     Social History:  reports that he has been smoking cigars. He has never used smokeless tobacco. He reports current alcohol use. He reports current drug use. Drug: Marijuana.   Exam: Current vital signs: BP 100/73    Pulse (!) 59    Temp 97.6 F (36.4 C) (Rectal)    Resp 20    Ht 5\' 8"  (1.727 m)    SpO2 99%    BMI 24.48 kg/m  Vital signs in last 24 hours: Temp:  [97.5 F (36.4 C)-97.6 F (36.4 C)] 97.6 F (36.4 C) (02/12 1007) Pulse Rate:  [59-96] 59 (02/12 1050) Resp:  [15-37] 20 (02/12 1050) BP: (100-147)/(70-90) 100/73 (02/12 1050) SpO2:  [99 %] 99 % (02/12 1050) FiO2 (%):  [40 %] 40 % (02/12 0947)   Physical Exam  Constitutional: Appears well-developed and well-nourished.  Psych: Does not respond Eyes: No scleral injection HENT:  Intubated MSK: no joint deformities.  Cardiovascular: Normal rate and regular rhythm.  Respiratory: Effort normal, non-labored breathing GI: Soft.  No distension. There is no tenderness.  Skin: WDI  Neuro: Mental Status: Does not open eyes or follow commands  cranial Nerves: II: Does not make the threat. Pupils are equal, round, and reactive to light.   III,IV, VI: Eyes are midline V: VII: Corneals are absent Motor: Does not respond to noxious stimulation  sensory: As above  Cerebellar: Does not perform     I have reviewed labs in epic and the results pertinent to this consultation are: Creatinine 1.18   I have reviewed the images obtained: CT head-unremarkable  Impression: 31 year old male with a history of MS and seizure disorder who presents in status epilepticus requiring intubation.  Currently, his exam is consistent with postictal coma, and therefore we will need stat EEG to rule out ongoing seizures.  He was recently started on amantadine which can lower seizure threshold some, though unclear if this was the inciting agent.  He will likely need higher Keppra dose in the future.  Recommendations: 1) discontinue amantadine 2) increase Keppra to 1 g twice daily 3) stat continuous EEG 4) neurology will follow   This patient is critically ill and at significant risk of neurological worsening,  death and care requires constant monitoring of vital signs, hemodynamics,respiratory and cardiac monitoring, neurological assessment, discussion with family, other specialists and medical decision making of high complexity. I spent 50 minutes of neurocritical care time  in the care of  this patient. This was time spent independent of any time provided by nurse practitioner or PA.  Ritta Slot, MD [facility] [phone]  If 7pm- 7am, please page neurology on call as listed in AMION. 03/07/2021  11:14 AM

## 2021-03-07 NOTE — Progress Notes (Signed)
Pt urine output 25 mL/hr for previous 3 hours. Whitney CCM NP notified of findings. Order given for slow LR bolus.

## 2021-03-07 NOTE — Progress Notes (Signed)
Patient intubated due to seizure activity and AMS, patient came in VIA EMS being manually ventilated by a BVM, SATS 99%, ED physician intubated with a 8.0 ETT taped at 24 cm at the lip, good color change on ETCO2 detector, good BBS ausculted over lung fields, SATS 99%, placed on above vent settings per ARDS net protocol, will continue to monitor patient.

## 2021-03-07 NOTE — Sedation Documentation (Signed)
Intubation started by Dr.Knapp. Sedation meds given.

## 2021-03-07 NOTE — ED Notes (Signed)
Attempted to call EEG multiple times with no answer. Unit secretary attempted calling EEG at this time with no answer.

## 2021-03-07 NOTE — ED Notes (Signed)
EEG at bedside.

## 2021-03-07 NOTE — Sedation Documentation (Signed)
Intubation successful with ETT size 8 and 24 at the lip.

## 2021-03-07 NOTE — ED Provider Notes (Addendum)
[facility] EMERGENCY DEPARTMENT Provider Note   CSN: [id number] Arrival date & time: 03/07/21  [id number]     History  Chief Complaint  Patient presents with   Seizures    Miguel Black is a 31 y.o. male.   Seizures  Patient has a history of seizure disorder status epilepticus and multiple sclerosis.  He was brought in by EMS for altered mental status and seizure.  Patient reportedly was at home this morning smoking marijuana.  Patient became unresponsive.  EMS was called.  They witnessed a tonic-clonic seizure.  He was given midazolam 5 mg IM.  Patient became hypoxic and had decreased respiratory effort.  Patient was never talking but apparently was following some commands when EMS initially arrived arrived.  He has been unresponsive since the seizure activity.  EMS has been providing bag-valve-mask ventilation.  On arrival in the ED the patient appeared to have additional seizure-like activity with eye deviation to the left and extension rigidity of his extremities.  Patient unable to provide any history.  Home Medications Prior to Admission medications   Medication Sig Start Date End Date Taking? Authorizing Provider  amantadine (SYMMETREL) 100 MG capsule Take 1 capsule (100 mg total) by mouth 2 (two) times daily. 02/04/21   Lomax, Amy, NP  cholecalciferol (VITAMIN D3) 25 MCG (1000 UNIT) tablet Take 5,000 Units by mouth daily.    [provider]  dalfampridine 10 MG TB12 Take 1 tablet (10 mg total) by mouth 2 (two) times daily. 02/08/21   Sater, Nanine Means, MD  levETIRAcetam (KEPPRA) 500 MG tablet Take 1 tablet (500 mg total) by mouth 2 (two) times daily. 02/04/21   Lomax, Amy, NP  ocrelizumab (OCREVUS) 300 MG/10ML injection Inject 20 mLs (600 mg total) into the vein every 6 (six) months. 600 mg every 6 months. 03/25/20   Sater, Nanine Means, MD      Allergies    Patient has no known allergies.    Review of Systems   Review of Systems  Constitutional:   Negative for fever.  Neurological:  Positive for seizures.   Physical Exam Updated Vital Signs BP 100/73    Pulse (!) 59    Temp 97.6 F (36.4 C) (Rectal)    Resp 20    Ht 1.727 m (5\' 8" )    SpO2 99%    BMI 24.48 kg/m  Physical Exam Vitals and nursing note reviewed.  Constitutional:      Appearance: He is well-developed. He is ill-appearing. He is not diaphoretic.  HENT:     Head: Normocephalic and atraumatic.     Right Ear: External ear normal.     Left Ear: External ear normal.  Eyes:     General: No scleral icterus.       Right eye: No discharge.        Left eye: No discharge.     Conjunctiva/sclera: Conjunctivae normal.  Neck:     Trachea: No tracheal deviation.  Cardiovascular:     Rate and Rhythm: Normal rate and regular rhythm.  Pulmonary:     Effort: Pulmonary effort is normal. No respiratory distress.     Breath sounds: Normal breath sounds. No stridor. No wheezing or rales.  Abdominal:     General: Bowel sounds are normal. There is no distension.     Palpations: Abdomen is soft.     Tenderness: There is no abdominal tenderness. There is no guarding or rebound.  Musculoskeletal:  General: No tenderness or deformity.     Cervical back: Neck supple.  Skin:    General: Skin is warm and dry.     Findings: No rash.  Neurological:     GCS: GCS eye subscore is 1. GCS verbal subscore is 1. GCS motor subscore is 1.     Motor: Abnormal muscle tone and seizure activity present.     Comments: Non verbal, not following commands, eyes deviated to the left  Psychiatric:        Mood and Affect: Mood normal.    ED Results / Procedures / Treatments   Labs (all labs ordered are listed, but only abnormal results are displayed) Labs Reviewed  COMPREHENSIVE METABOLIC PANEL - Abnormal; Notable for the following components:      Result Value   CO2 20 (*)    Glucose, Bld 116 (*)    Calcium 8.8 (*)    All other components within normal limits  CBC WITH  DIFFERENTIAL/PLATELET - Abnormal; Notable for the following components:   HCT 55.1 (*)    MCV 105.8 (*)    All other components within normal limits  I-STAT ARTERIAL BLOOD GAS, ED - Abnormal; Notable for the following components:   pH, Arterial 7.318 (*)    pO2, Arterial 194 (*)    Calcium, Ion 1.13 (*)    All other components within normal limits  ETHANOL  RAPID URINE DRUG SCREEN, HOSP PERFORMED  CBG MONITORING, ED    EKG None  Radiology DG Chest Portable 1 View  Result Date: 03/07/2021 CLINICAL DATA:  Intubation EXAM: PORTABLE CHEST 1 VIEW COMPARISON:  07/11/2021 FINDINGS: Endotracheal tube is positioned with tip just below the thoracic inlet. Esophagogastric tube with tip and side port below the diaphragm. The heart size and mediastinal contours are within normal limits. Both lungs are clear. The visualized skeletal structures are unremarkable. IMPRESSION: 1. Endotracheal tube is positioned with tip just below the thoracic inlet. 2.  Esophagogastric tube with tip and side port below the diaphragm. 3.  No acute abnormality of the lungs. Electronically Signed   By: Delanna Ahmadi M.D.   On: 03/07/2021 10:39    Procedures Procedure Name: Intubation Date/Time: 03/07/2021 9:36 AM Performed by: Dorie Rank, MD Pre-anesthesia Checklist: Patient identified, Patient being monitored, Emergency Drugs available, Timeout performed and Suction available Oxygen Delivery Method: Non-rebreather mask Preoxygenation: Pre-oxygenation with 100% oxygen Induction Type: Rapid sequence Ventilation: Mask ventilation without difficulty Laryngoscope Size: Glidescope Grade View: Grade I Tube size: 8.0 mm Number of attempts: 1 Placement Confirmation: ETT inserted through vocal cords under direct vision, CO2 detector and Breath sounds checked- equal and bilateral Secured at: 24 cm Tube secured with: ETT holder Dental Injury: Teeth and Oropharynx as per pre-operative assessment     .Critical  Care Performed by: Dorie Rank, MD Authorized by: Dorie Rank, MD   Critical care provider statement:    Critical care time (minutes):  30   Critical care was time spent personally by me on the following activities:  Development of treatment plan with patient or surrogate, discussions with consultants, evaluation of patient's response to treatment, examination of patient, ordering and review of laboratory studies, ordering and review of radiographic studies, ordering and performing treatments and interventions, pulse oximetry, re-evaluation of patient's condition and review of old charts    Medications Ordered in ED Medications  midazolam (VERSED) 2 MG/2ML injection (  Not Given 03/07/21 0942)  fentaNYL (SUBLIMAZE) 50 MCG/ML injection (  Not Given 03/07/21 0948)  ketamine  HCl 50 MG/5ML SOSY (  Not Given 03/07/21 0947)  rocuronium bromide 100 MG/10ML SOSY (  Not Given 03/07/21 0942)  succinylcholine (ANECTINE) 200 MG/10ML syringe (  Not Given 03/07/21 0946)  etomidate (AMIDATE) 2 MG/ML injection (  Not Given 03/07/21 0938)  LORazepam (ATIVAN) 2 MG/ML injection (  Not Given 03/07/21 0942)  succinylcholine (ANECTINE) 200 MG/10ML syringe (  Not Given 03/07/21 0946)  sodium chloride 0.9 % bolus 1,000 mL (0 mLs Intravenous Stopped 03/07/21 1044)    And  0.9 %  sodium chloride infusion ( Intravenous New Bag/Given 03/07/21 1046)  LORazepam (ATIVAN) injection 4 mg (4 mg Intravenous Given 03/07/21 0940)  fentaNYL 253mcg in NS 2108mL (36mcg/ml) infusion-PREMIX (400 mcg/hr Intravenous Rate/Dose Change 03/07/21 1050)  fentaNYL (SUBLIMAZE) bolus via infusion 50 mcg (has no administration in time range)  propofol (DIPRIVAN) 1000 MG/100ML infusion (30 mcg/kg/min  75 kg (Order-Specific) Intravenous Rate/Dose Change 03/07/21 1050)  LORazepam (ATIVAN) injection (2 mg Intravenous Given 03/07/21 0920)  etomidate (AMIDATE) injection (20 mg Intravenous Given 03/07/21 0924)  succinylcholine (ANECTINE) injection (100 mg  Intravenous Given 03/07/21 0925)  levETIRAcetam (KEPPRA) IVPB 500 mg/100 mL premix (0 mg Intravenous Stopped 03/07/21 1036)  fentaNYL (SUBLIMAZE) injection 50 mcg (50 mcg Intravenous Given 03/07/21 0940)    ED Course/ Medical Decision Making/ A&P Clinical Course as of 03/07/21 1102  Sun Mar 07, 2021  1048 CBC with Differential/Platelet(!) No significant abnormalities [JK]  1048 Comprehensive metabolic panel(!) No significant abnormalities [JK]  1048 Ethanol Negative [JK]  1048 DG Chest Portable 1 View Chest x-ray without acute findings, tubes appropriate position [JK]  1052 Neurology consulted, Dr Leonel Ramsay.   Plans on stat EEG [JK]  1101 Discussed with PCCM regarding admission. [JK]    Clinical Course User Index [JK] Dorie Rank, MD                           Medical Decision Making Amount and/or Complexity of Data Reviewed Labs: ordered. Decision-making details documented in ED Course. Radiology: ordered. Decision-making details documented in ED Course.  Risk Prescription drug management. Decision regarding hospitalization.   Altered mental status. Patient presented with altered mental status and witnessed seizures.  Patient had recurrent seizures without return to baseline.  Presentation concerning for status epilepticus.  Patient was given additional Ativan doses and IV Keppra.  Seizure activity has resolved however the patient also is on propofol and fentanyl drips at this time.  Neurology consulted and plan is for stat EEG.  Patient intubated for airway protection.  Will consult with critical care service for admission.        Final Clinical Impression(s) / ED Diagnoses Final diagnoses:  Status epilepticus [facility])     Dorie Rank, MD 03/07/21 1102    Dorie Rank, MD 03/07/21 1308

## 2021-03-07 NOTE — ED Notes (Signed)
Taken to CT by this RN and RT.

## 2021-03-07 NOTE — Progress Notes (Signed)
PCCM progress note  Called patient's mother Miguel Black for update.  All questions answered.  She did states that patient informed her 2/8 that he was running out of his Keppra.  At the time she was working and missed the importance of reordering medications.  She believes patient was out of Keppra since 2/9.  Miguel Black D. Tiburcio Pea, NP-C [facility] Personal contact information can be found on Amion  03/07/2021, 5:01 PM

## 2021-03-07 NOTE — ED Notes (Signed)
Miguel Forehand, NP notified that patient has been consistently bradycardic with HR in the low 40s to mid 40s. Sinus Bradycardia. BP stable. No change in care at this time. Harris NP placing orders for PRN atropine.

## 2021-03-08 ENCOUNTER — Inpatient Hospital Stay (HOSPITAL_COMMUNITY): Payer: Medicare (Managed Care)

## 2021-03-08 DIAGNOSIS — G9341 Metabolic encephalopathy: Secondary | ICD-10-CM

## 2021-03-08 LAB — HIV ANTIBODY (ROUTINE TESTING W REFLEX): HIV Screen 4th Generation wRfx: NONREACTIVE

## 2021-03-08 LAB — POCT I-STAT 7, (LYTES, BLD GAS, ICA,H+H)
Acid-base deficit: 1 mmol/L (ref 0.0–2.0)
Bicarbonate: 25.2 mmol/L (ref 20.0–28.0)
Calcium, Ion: 1.09 mmol/L — ABNORMAL LOW (ref 1.15–1.40)
HCT: 44 % (ref 39.0–52.0)
Hemoglobin: 15 g/dL (ref 13.0–17.0)
O2 Saturation: 100 %
Patient temperature: 99.1
Potassium: 3.2 mmol/L — ABNORMAL LOW (ref 3.5–5.1)
Sodium: 139 mmol/L (ref 135–145)
TCO2: 27 mmol/L (ref 22–32)
pCO2 arterial: 48.5 mmHg — ABNORMAL HIGH (ref 32.0–48.0)
pH, Arterial: 7.325 — ABNORMAL LOW (ref 7.350–7.450)
pO2, Arterial: 226 mmHg — ABNORMAL HIGH (ref 83.0–108.0)

## 2021-03-08 LAB — BASIC METABOLIC PANEL
Anion gap: 10 (ref 5–15)
BUN: 8 mg/dL (ref 6–20)
CO2: 21 mmol/L — ABNORMAL LOW (ref 22–32)
Calcium: 7.7 mg/dL — ABNORMAL LOW (ref 8.9–10.3)
Chloride: 108 mmol/L (ref 98–111)
Creatinine, Ser: 1.14 mg/dL (ref 0.61–1.24)
GFR, Estimated: >60 mL/min (ref >60)
Glucose, Bld: 76 mg/dL (ref 70–99)
Potassium: 3.5 mmol/L (ref 3.5–5.1)
Sodium: 139 mmol/L (ref 135–145)

## 2021-03-08 LAB — CBC
HCT: 41.8 % (ref 39.0–52.0)
Hemoglobin: 13.6 g/dL (ref 13.0–17.0)
MCH: 31.9 pg (ref 26.0–34.0)
MCHC: 32.5 g/dL (ref 30.0–36.0)
MCV: 97.9 fL (ref 80.0–100.0)
Platelets: 156 10*3/uL (ref 150–400)
RBC: 4.27 MIL/uL (ref 4.22–5.81)
RDW: 13.6 % (ref 11.5–15.5)
WBC: 15 10*3/uL — ABNORMAL HIGH (ref 4.0–10.5)
nRBC: 0 % (ref 0.0–0.2)

## 2021-03-08 LAB — TRIGLYCERIDES: Triglycerides: 96 mg/dL (ref <150)

## 2021-03-08 LAB — MAGNESIUM: Magnesium: 1.4 mg/dL — ABNORMAL LOW (ref 1.7–2.4)

## 2021-03-08 LAB — GLUCOSE, CAPILLARY
Glucose-Capillary: 85 mg/dL (ref 70–99)
Glucose-Capillary: 94 mg/dL (ref 70–99)

## 2021-03-08 LAB — PHOSPHORUS: Phosphorus: 3.5 mg/dL (ref 2.5–4.6)

## 2021-03-08 MED ORDER — PANTOPRAZOLE 2 MG/ML SUSPENSION
40.0000 mg | Freq: Every day | ORAL | Status: DC
  Administered 2021-03-08: 40 mg
  Filled 2021-03-08: qty 20

## 2021-03-08 MED ORDER — CHLORHEXIDINE GLUCONATE 0.12% ORAL RINSE (MEDLINE KIT)
15.0000 mL | Freq: Two times a day (BID) | OROMUCOSAL | Status: DC
  Administered 2021-03-08: 15 mL via OROMUCOSAL

## 2021-03-08 MED ORDER — POTASSIUM CHLORIDE 20 MEQ PO PACK
40.0000 meq | PACK | ORAL | Status: AC
  Administered 2021-03-08 (×2): 40 meq
  Filled 2021-03-08 (×2): qty 2

## 2021-03-08 MED ORDER — ETOMIDATE 2 MG/ML IV SOLN
INTRAVENOUS | Status: AC
  Administered 2021-03-08: 20 mg via INTRAVENOUS
  Filled 2021-03-08: qty 10

## 2021-03-08 MED ORDER — PROSOURCE TF PO LIQD
45.0000 mL | Freq: Every day | ORAL | Status: DC
  Administered 2021-03-08: 45 mL

## 2021-03-08 MED ORDER — ETOMIDATE 2 MG/ML IV SOLN: 20.0000 mg | Freq: Once | INTRAVENOUS | Status: AC

## 2021-03-08 MED ORDER — DEXMEDETOMIDINE HCL IN NACL 400 MCG/100ML IV SOLN
0.4000 ug/kg/h | INTRAVENOUS | Status: DC
  Administered 2021-03-08: 0.4 ug/kg/h via INTRAVENOUS
  Administered 2021-03-08: 0.6 ug/kg/h via INTRAVENOUS
  Filled 2021-03-08 (×2): qty 100

## 2021-03-08 MED ORDER — ORAL CARE MOUTH RINSE
15.0000 mL | OROMUCOSAL | Status: DC
  Administered 2021-03-08 – 2021-03-09 (×2): 15 mL via OROMUCOSAL

## 2021-03-08 MED ORDER — FENTANYL CITRATE PF 50 MCG/ML IJ SOSY: 50.0000 ug | PREFILLED_SYRINGE | INTRAMUSCULAR | Status: DC | PRN

## 2021-03-08 MED ORDER — OSMOLITE 1.5 CAL PO LIQD
1000.0000 mL | ORAL | Status: DC
  Administered 2021-03-08: 1000 mL

## 2021-03-08 MED ORDER — ROCURONIUM BROMIDE 10 MG/ML (PF) SYRINGE
PREFILLED_SYRINGE | INTRAVENOUS | Status: AC
  Administered 2021-03-08: 50 mg via INTRAVENOUS
  Filled 2021-03-08: qty 10

## 2021-03-08 MED ORDER — MAGNESIUM SULFATE 4 GM/100ML IV SOLN
4.0000 g | Freq: Once | INTRAVENOUS | Status: AC
  Administered 2021-03-08: 4 g via INTRAVENOUS
  Filled 2021-03-08: qty 100

## 2021-03-08 MED ORDER — ROCURONIUM BROMIDE 50 MG/5ML IV SOLN
50.0000 mg | Freq: Once | INTRAVENOUS | Status: AC
  Filled 2021-03-08: qty 5

## 2021-03-08 NOTE — Procedures (Signed)
Intubation Procedure Note  Miguel Black  [id number]  01/31/1990  Date:03/08/21  Time:11:45 AM   Provider Performing:Gresham Caetano Chauncey Cruel Shearon Stalls    Procedure: Intubation (29937)  Indication(s) Respiratory Failure. Patient self-extubated earlier this morning but then became lethargic, unable to clear secretions and hypoxemic  Consent Risks of the procedure as well as the alternatives and risks of each were explained to the patient and/or caregiver.  Consent for the procedure was obtained and is signed in the bedside chart   Anesthesia Etomidate and Rocuronium   Time Out Verified patient identification, verified procedure, site/side was marked, verified correct patient position, special equipment/implants available, medications/allergies/relevant history reviewed, required imaging and test results available.   Sterile Technique Usual hand hygeine, masks, and gloves were used   Procedure Description Patient positioned in bed supine.  Sedation given as noted above.  Patient was intubated with endotracheal tube using  direct laryngoscopy mac 4 .  View was Grade 1 full glottis .  Number of attempts was 1.  Colorimetric CO2 detector was consistent with tracheal placement.   Complications/Tolerance None; patient tolerated the procedure well. Chest X-ray is ordered to verify placement.   EBL Minimal   Specimen(s) None  Lenice Llamas, MD [facility] [place] 03/08/2021 11:46 AM Pager: see AMION  If no response to pager, please call critical care on call (see AMION) until 7pm After 7:00 pm call Elink

## 2021-03-08 NOTE — Progress Notes (Signed)
No seizures on LTM.     Past Medical History:  Diagnosis Date   MS (multiple sclerosis) (HCC)    Seizure (HCC)    Status epilepticus (HCC) 07/12/2018     Family History  Problem Relation Age of Onset   Hypertension Mother    Healthy Father    Healthy Sister    Healthy Brother    Diabetes Maternal Grandmother    Hypertension Maternal Grandfather    Hodgkin's lymphoma Brother    Colon cancer Maternal Uncle 45    Exam: Current vital signs: BP (!) 151/89    Pulse (!) 105    Temp 99.1 F (37.3 C) (Oral)    Resp 19    Ht 5\' 8"  (1.727 m)    Wt 73 kg    SpO2 100%    BMI 24.47 kg/m  Vital signs in last 24 hours: Temp:  [96.4 F (35.8 C)-99.8 F (37.7 C)] 99.1 F (37.3 C) (02/13 0636) Pulse Rate:  [53-114] 105 (02/13 1300) Resp:  [14-20] 19 (02/13 1300) BP: (89-158)/(51-100) 151/89 (02/13 1300) SpO2:  [99 %-100 %] 100 % (02/13 1300) FiO2 (%):  [30 %-100 %] 60 % (02/13 1258)   Physical Exam  Constitutional: Appears well-developed and well-nourished.  Cardiovascular: Normal rate and regular rhythm.  Respiratory: intubated. GI: Soft.  No distension. There is no tenderness.  Skin: WDI  Neuro: Mental Status: Opens eyes to voice. Will follow commands. Give thumbs up to command. cranial Nerves: II: Pupils are equal, round, and reactive to light.  EOMI. Blink to threat b/l.   Motor: Non focal, will move to command.  I have reviewed the images obtained: CT head-unremarkable  LTM: 2/12-2/13: Impression: This was an abnormal continuous video EEG due to diffuse low voltage slowing, indicative of a sedated encephalopathy pattern. There were no epileptiform discharges or seizures.   Impression: 31 year old male with a history of MS and seizure disorder who presents in status epilepticus requiring intubation.   He was recently started on amantadine which can lower seizure threshold, Keppra increased. LMT neg overnight. Self extubated this am and had to be  reintubated.  Recommendations: Con't Keppra to 1 g twice daily D/C LTM Will con't to follow.  ATTENDING ATTESTATION: This patient is critically ill due to respiratory distress, status and at significant risk of neurological worsening, death form heart failure, respiratory failure, recurrent stroke, bleeding from [facility], seizure, sepsis. This patient's care requires constant monitoring of vital signs, hemodynamics, respiratory and cardiac monitoring, review of multiple databases, neurological assessment, discussion with family, other specialists and medical decision making of high complexity. I spent 35 minutes of neurocritical care time in the care of this patient.   Persis Graffius,MD

## 2021-03-08 NOTE — Progress Notes (Signed)
Patient self extubated. Patient is currently on 15L NRB. Spo2 100%. No respiratory distress noted. Ventilator is on standby at bedside. RT will continue to monitor as needed.

## 2021-03-08 NOTE — Progress Notes (Signed)
Initial Nutrition Assessment  DOCUMENTATION CODES:   Not applicable  INTERVENTION:   Initiate tube feeding via OG tube: Osmolite 1.5 at 60 ml/h (1440 ml per day) Prosource TF 45 ml daily   Provides 2200 kcal, 101 gm protein, 1094 ml free water daily   NUTRITION DIAGNOSIS:   Inadequate oral intake related to inability to eat as evidenced by NPO status.  GOAL:   Patient will meet greater than or equal to 90% of their needs  MONITOR:   TF tolerance  REASON FOR ASSESSMENT:   Consult Enteral/tube feeding initiation and management  ASSESSMENT:   Pt with PMH of Multiple Sclerosis and seizures admitted with seizures from home after smoking marijuana.   Pt discussed during ICU rounds and with RN. Ok with MD to start TF.   2/13 self-extubated; re-intubated   Medications reviewed and include: protonix Precedex  Keppra  Labs reviewed: K 3.2   Diet Order:   Diet Order             Diet NPO time specified  Diet effective now                   EDUCATION NEEDS:   Not appropriate for education at this time  Skin:  Skin Assessment: Reviewed RN Assessment  Last BM:  unknown  Height:   Ht Readings from Last 1 Encounters:  03/07/21 5\' 8"  (1.727 m)    Weight:   Wt Readings from Last 1 Encounters:  03/07/21 73 kg    BMI:  Body mass index is 24.47 kg/m.  Estimated Nutritional Needs:   Kcal:  2000-2200  Protein:  100-115 grams  Fluid:  > 2 L/day  05/05/21., RD, LDN, CNSC See AMiON for contact information

## 2021-03-08 NOTE — Progress Notes (Signed)
°  Transition of Care [facility]) Screening Note   Patient Details  Name: Miguel Black Date of Birth: 03-19-1990   Transition of Care [facility]) CM/SW Contact:    Mearl Latin, LCSW Phone Number: 03/08/2021, 9:41 AM    Transition of Care Department [facility]) has reviewed patient and no TOC needs have been identified at this time. We will continue to monitor patient advancement through interdisciplinary progression rounds. If new patient transition needs arise, please place a TOC consult.

## 2021-03-08 NOTE — Progress Notes (Signed)
Pt self extubated with wrist restraints and mitts on. NRB placed on patient, sat 100%, RR 15, will continue to monitor.

## 2021-03-08 NOTE — Plan of Care (Signed)
Patient remains on sedation, restraints applied to avoid potential self extubation. Patient responding to painful stimuli in all extremities except for LLE. Opens eyes to voice occasionally. Will continue to monitor closely    Problem: Safety: Goal: Non-violent Restraint(s) Outcome: Not Progressing   Problem: Education: Goal: Knowledge of General Education information will improve Description: Including pain rating scale, medication(s)/side effects and non-pharmacologic comfort measures Outcome: Not Progressing   Problem: Health Behavior/Discharge Planning: Goal: Ability to manage health-related needs will improve Outcome: Not Progressing   Problem: Clinical Measurements: Goal: Ability to maintain clinical measurements within normal limits will improve Outcome: Not Progressing Goal: Will remain free from infection Outcome: Progressing Goal: Diagnostic test results will improve Outcome: Not Progressing Goal: Respiratory complications will improve Outcome: Not Progressing Goal: Cardiovascular complication will be avoided Outcome: Progressing   Problem: Activity: Goal: Risk for activity intolerance will decrease Outcome: Not Progressing   Problem: Nutrition: Goal: Adequate nutrition will be maintained Outcome: Not Progressing   Problem: Coping: Goal: Level of anxiety will decrease Outcome: Progressing   Problem: Elimination: Goal: Will not experience complications related to bowel motility Outcome: Not Progressing Goal: Will not experience complications related to urinary retention Outcome: Progressing

## 2021-03-08 NOTE — Progress Notes (Signed)
eLink Physician-Brief Progress Note Patient Name: Miguel Black DOB: 1990-04-30 MRN: [id number]   Date of Service  03/08/2021  HPI/Events of Note  Patient self-extubated again. He self-extubated earlier today and had to be re-intubated. Currently on 15 L NRB. Sat = 100% and RR = 12. On video assessment he looks comfortable. However, he is lethargic and may not tolerate his self-extubation.   eICU Interventions  Plan: Will request that PCCM ground team evaluate the patient at bedside for possible re-intubation.      Intervention Category Major Interventions: Other:  Miguel Black 03/08/2021, 10:25 PM

## 2021-03-08 NOTE — Progress Notes (Signed)
NAME:  Miguel Black, MRN:  [id number], DOB:  1990-09-04, LOS: 1 ADMISSION DATE:  03/07/2021, CONSULTATION DATE:  03/07/2021 REFERRING MD:  Dr. Lynelle Doctor, CHIEF COMPLAINT:  Seizure    History of Present Illness:  Miguel Black is a 31 y.o. male with a PMH significant for MS and known seizure discharge and prior status epilepticus who presented to the ED via EMS for AMS and seizures. Per report patient was seen at home today smoking marijuana when he later became unresponisve . Witnessed tonic-clonic seizure with EMS for which he was given midazolam and later required BMV in route to ED.   On ED arrival patient was seen with continues seizure activity and he was intubated for airway protection. Head CT was unremarkable. Neurotology was consulted and PCCM was called for further management and admission.   Pertinent  Medical History  MS and known seizure discharge and prior status epilepticus  Significant Hospital Events: Including procedures, antibiotic start and stop dates in addition to other pertinent events   2/12 admitted for seizures, intubated in ED  Interim History / Subjective:   Overnight LTM with EEG done - no further seizures. He is is now awake and following commands.  Objective   Blood pressure 112/62, pulse 71, temperature 99.1 F (37.3 C), temperature source Oral, resp. rate 20, height 5\' 8"  (1.727 m), weight 73 kg, SpO2 100 %.    Vent Mode: PRVC FiO2 (%):  [30 %-40 %] 30 % Set Rate:  [20 bmp] 20 bmp Vt Set:  [540 mL-550 mL] 540 mL PEEP:  [5 cmH20] 5 cmH20 Plateau Pressure:  [12 cmH20-19 cmH20] 19 cmH20   Intake/Output Summary (Last 24 hours) at 03/08/2021 03/10/2021 Last data filed at 03/08/2021 0800 Gross per 24 hour  Intake 5181.01 ml  Output 1100 ml  Net 4081.01 ml   Filed Weights   03/07/21 1315  Weight: 73 kg    Examination: Gen:      Intubated, sedated, acutely ill appearing HEENT:  ETT to vent, EEG leads in place Lungs:    sounds of mechanical  ventilation auscultated no wheezes or crackles CV:         RRR, no mrg Abd:      + bowel sounds; soft, non-tender; no palpable masses, no distension Ext:    No edema Skin:      Warm and dry; no rashes Neuro:   sedated, RASS -1, awakens and follows commands  Labs reviewed - K and Mag low  Resolved Hospital Problem list     Assessment & Plan:  Breakthrough Seizure Acute encephalopathy secondary to  Multiple Sclerosis -Known seizure hx on home Keppra, amantadine, dalfampridine, and ocrelizumab  - seizures likely secondary to keppra non-adherence based on collateral information from family P: Primary management per neurology  Continue AEDS EEG Maintain neuro protective measures; goal for eurothermia, euglycemia, eunatermia, normoxia, and PCO2 goal of 35-40 Nutrition and bowel regiment  Seizure precautions  Aspirations precautions   Acute Hypoxic Respiratory Failure  -Secondary to above P: Continue ventilator support with lung protective strategies  Wean PEEP and FiO2 for sats greater than 90%. Head of bed elevated 30 degrees. Plateau pressures less than 30 cm H20.  Follow intermittent chest x-ray and ABG.   Ensure adequate pulmonary hygiene  VAP bundle in place  PAD protocol - SBT today, he likely can be extubated, will discuss with neurology.   Hypokalemia Hypomagnesemia  P: Replace both and repeat BMET in am.    Best Practice (right  click and "Reselect all SmartList Selections" daily)   Diet/type: NPO DVT prophylaxis: prophylactic heparin  GI prophylaxis: PPI Lines: N/A Foley:  N/A Code Status:  full code Last date of multidisciplinary goals of care discussion: will updated mother   Labs   CBC: Recent Labs  Lab 03/07/21 0930 03/07/21 1048 03/08/21 0553  WBC 8.0  --  15.0*  NEUTROABS 4.8  --   --   HGB 16.9 13.9 13.6  HCT 55.1* 41.0 41.8  MCV 105.8*  --  97.9  PLT 217  --  156    Basic Metabolic Panel: Recent Labs  Lab 03/07/21 0930  03/07/21 1048 03/08/21 0553  NA 138 139 139  K 4.7 4.0 3.5  CL 103  --  108  CO2 20*  --  21*  GLUCOSE 116*  --  76  BUN 10  --  8  CREATININE 1.18  --  1.14  CALCIUM 8.8*  --  7.7*  MG  --   --  1.4*  PHOS  --   --  3.5   GFR: Estimated Creatinine Clearance: 92.5 mL/min (by C-G formula based on SCr of 1.14 mg/dL). Recent Labs  Lab 03/07/21 0930 03/08/21 0553  WBC 8.0 15.0*    Liver Function Tests: Recent Labs  Lab 03/07/21 0930  AST 27  ALT 15  ALKPHOS 68  BILITOT 0.8  PROT 7.7  ALBUMIN 4.6   No results for input(s): LIPASE, AMYLASE in the last 168 hours. No results for input(s): AMMONIA in the last 168 hours.  ABG    Component Value Date/Time   PHART 7.318 (L) 03/07/2021 1048   PCO2ART 47.5 03/07/2021 1048   PO2ART 194 (H) 03/07/2021 1048   HCO3 24.4 03/07/2021 1048   TCO2 26 03/07/2021 1048   ACIDBASEDEF 2.0 03/07/2021 1048   O2SAT 100.0 03/07/2021 1048      Critical care time:     The patient is critically ill due to respiratory failure, seizure, encephalopathy.  Critical care was necessary to treat or prevent imminent or life-threatening deterioration.  Critical care was time spent personally by me on the following activities: development of treatment plan with patient and/or surrogate as well as nursing, discussions with consultants, evaluation of patient's response to treatment, examination of patient, obtaining history from patient or surrogate, ordering and performing treatments and interventions, ordering and review of laboratory studies, ordering and review of radiographic studies, pulse oximetry, re-evaluation of patient's condition and participation in multidisciplinary rounds.   Critical Care Time devoted to patient care services described in this note is 35 minutes. This time reflects time of care of this signee Charlott Holler . This critical care time does not reflect separately billable procedures or procedure time, teaching time or supervisory  time of PA/NP/Med student/Med Resident etc but could involve care discussion time.       Charlott Holler [facility] 03/08/2021 9:23 AM  Pager: see AMION  If no response to pager , please call critical care on call (see AMION) until 7pm After 7:00 pm call Elink

## 2021-03-08 NOTE — Progress Notes (Signed)
LTM EEG discontinued - no skin breakdown at unhook.   

## 2021-03-08 NOTE — Progress Notes (Signed)
Patient self extubated 1020 in wrist restraints. Patient immediately suctioned and placed on 4L [place]. SpO2 100%. Bilateral wrist restraints taken off. Amy RT and CCM MD notified.  Montez Hageman RN

## 2021-03-08 NOTE — Progress Notes (Signed)
Verbal order from Dr Shearon Stalls to hold patient's tube feeds to allow the patient's distended stomach to decompress with LIWS for a few hours. Tube feeding re-timed and Maylon Peppers, with nutrition notified.  Montez Hageman RN

## 2021-03-08 NOTE — Procedures (Signed)
EEG Procedure CPT/Type of Study: 95720; 24hr EEG with video Referring Provider: Celine Mans Primary Neurological Diagnosis: status epilepticus  History: This is a 31 yr old patient, undergoing an EEG to evaluate for status epilepticus. Clinical State: obtunded  Technical Description:  The EEG was performed using standard setting per the guidelines of American Clinical Neurophysiology Society (ACNS).  A minimum of 21 electrodes were placed on scalp according to the International 10-20 or/and 10-10 Systems. Supplemental electrodes were placed as needed. Single EKG electrode was also used to detect cardiac arrhythmia. Patient's behavior was continuously recorded on video simultaneously with EEG. A minimum of 16 channels were used for data display. Each epoch of study was reviewed manually daily and as needed using standard referential and bipolar montages. Computerized quantitative EEG analysis (such as compressed spectral array analysis, trending, automated spike & seizure detection) were used as indicated.   Day 1: from 1145 03/07/21 to 0730 03/08/21  EEG Description: Overall Amplitude: Low Predominant Frequency: The background activity showed low voltage delta-theta activity, with about 1-4 Hz, that was rare. Superimposed Frequencies: none The background was symmetric  Background Abnormalities: Generalized slowing Rhythmic or periodic pattern: No Epileptiform activity: no Electrographic seizures: no Events: no   Breach rhythm: no  Reactivity: Present  Stimulation procedures:  Hyperventilation: not done Photic stimulation: not done  Sleep Background: none  EKG:no significant arrhythmia  Impression: This was an abnormal continuous video EEG due to diffuse low voltage slowing, indicative of a sedated encephalopathy pattern. There were no epileptiform discharges or seizures.

## 2021-03-08 NOTE — Progress Notes (Signed)
Patient began desating to the 57s and becoming increasingly somnolent. Patient also began "hiccup-like" breathing and had lots of thick secretions draining out of mouth. I began to BVM patient. Amy, RT and Dr Shearon Stalls notified. Patient reintubated. And vitals are now stable.   Montez Hageman RN

## 2021-03-08 NOTE — Progress Notes (Signed)
RT NOTE: RT called and notified that patient had self extubated. RT came to unit and found patient on 4L [place] with sats of 100% and in no distress/no increased WOB. MD already notified. Vitals are stable and patient is resting comfortably. RT will continue to monitor.

## 2021-03-09 DIAGNOSIS — E162 Hypoglycemia, unspecified: Secondary | ICD-10-CM

## 2021-03-09 DIAGNOSIS — G9340 Encephalopathy, unspecified: Secondary | ICD-10-CM

## 2021-03-09 LAB — BASIC METABOLIC PANEL
Anion gap: 15 (ref 5–15)
BUN: 5 mg/dL — ABNORMAL LOW (ref 6–20)
CO2: 18 mmol/L — ABNORMAL LOW (ref 22–32)
Calcium: 8.1 mg/dL — ABNORMAL LOW (ref 8.9–10.3)
Chloride: 107 mmol/L (ref 98–111)
Creatinine, Ser: 1.03 mg/dL (ref 0.61–1.24)
GFR, Estimated: >60 mL/min (ref >60)
Glucose, Bld: 66 mg/dL — ABNORMAL LOW (ref 70–99)
Potassium: 4.4 mmol/L (ref 3.5–5.1)
Sodium: 140 mmol/L (ref 135–145)

## 2021-03-09 LAB — MAGNESIUM: Magnesium: 2.1 mg/dL (ref 1.7–2.4)

## 2021-03-09 LAB — PHOSPHORUS: Phosphorus: 3.1 mg/dL (ref 2.5–4.6)

## 2021-03-09 LAB — GLUCOSE, CAPILLARY: Glucose-Capillary: 73 mg/dL (ref 70–99)

## 2021-03-09 MED ORDER — ADULT MULTIVITAMIN W/MINERALS CH
1.0000 | ORAL_TABLET | Freq: Every day | ORAL | Status: DC
  Administered 2021-03-09 – 2021-03-10 (×2): 1 via ORAL
  Filled 2021-03-09 (×2): qty 1

## 2021-03-09 MED ORDER — ENOXAPARIN SODIUM 40 MG/0.4ML IJ SOSY
40.0000 mg | PREFILLED_SYRINGE | INTRAMUSCULAR | Status: DC
  Administered 2021-03-09: 40 mg via SUBCUTANEOUS
  Filled 2021-03-09: qty 0.4

## 2021-03-09 MED ORDER — DOCUSATE SODIUM 100 MG PO CAPS: 100.0000 mg | ORAL_CAPSULE | Freq: Two times a day (BID) | ORAL | Status: DC | PRN

## 2021-03-09 MED ORDER — ONDANSETRON HCL 4 MG/2ML IJ SOLN
4.0000 mg | Freq: Four times a day (QID) | INTRAMUSCULAR | Status: DC | PRN
  Administered 2021-03-09: 4 mg via INTRAVENOUS
  Filled 2021-03-09 (×2): qty 2

## 2021-03-09 MED ORDER — POLYETHYLENE GLYCOL 3350 17 G PO PACK: 17.0000 g | PACK | Freq: Every day | ORAL | Status: DC | PRN

## 2021-03-09 MED ORDER — ENSURE ENLIVE PO LIQD
237.0000 mL | Freq: Two times a day (BID) | ORAL | Status: DC
  Administered 2021-03-09 – 2021-03-10 (×2): 237 mL via ORAL

## 2021-03-09 MED ORDER — LEVETIRACETAM 500 MG PO TABS
1000.0000 mg | ORAL_TABLET | Freq: Two times a day (BID) | ORAL | Status: DC
  Administered 2021-03-09 – 2021-03-10 (×2): 1000 mg via ORAL
  Filled 2021-03-09 (×2): qty 2

## 2021-03-09 MED ORDER — VITAMIN D 25 MCG (1000 UNIT) PO TABS
5000.0000 [IU] | ORAL_TABLET | Freq: Every day | ORAL | Status: DC
  Administered 2021-03-09 – 2021-03-10 (×2): 5000 [IU] via ORAL
  Filled 2021-03-09 (×2): qty 5

## 2021-03-09 NOTE — Progress Notes (Addendum)
Nutrition Follow-up  DOCUMENTATION CODES:   Not applicable  INTERVENTION:   -Ensure Enlive po BID, each supplement provides 350 kcal and 20 grams of protein -MVI with minerals daily -D/c Osmolite and Prosource due to no feeding access  NUTRITION DIAGNOSIS:   Inadequate oral intake related to inability to eat as evidenced by NPO status.  Progressing; advanced to PO diet on 03/09/21  GOAL:   Patient will meet greater than or equal to 90% of their needs  Progressing   MONITOR:   TF tolerance  REASON FOR ASSESSMENT:   Consult Enteral/tube feeding initiation and management  ASSESSMENT:   Pt with PMH of Multiple Sclerosis and seizures admitted with seizures from home after smoking marijuana.  2/13- self-extubated, reintubated, TF initiated, self-extubated again 2/14- s/p BSE- advanced to regular diet with thin liquids  Reviewed I/O's: +420 ml x 24 hours and +4.2 L since admission  UOP: 1.6 L x 24 hours   Pt unavailable at time of visit. Attempted to speak with pt via call to hospital room phone, however, unable to reach.   Per PCCM notes, plan to transfer to floor if unable to discharge today.   Pt just advanced to regular diet. No meal completion data available at this time. Given increased nutritional needs, pt would benefit from addition of oral nutrition supplements.   Reviewed wt hx; wt has been stable over the past 13 months.   Medications reviewed and include keppra.    Labs reviewed: CBGS: 73-94.   Diet Order:   Diet Order             Diet regular Room service appropriate? Yes; Fluid consistency: Thin  Diet effective now                   EDUCATION NEEDS:   Not appropriate for education at this time  Skin:  Skin Assessment: Reviewed RN Assessment  Last BM:  Unknown  Height:   Ht Readings from Last 1 Encounters:  03/07/21 5\' 8"  (1.727 m)    Weight:   Wt Readings from Last 1 Encounters:  03/07/21 73 kg   BMI:  Body mass index is  24.47 kg/m.  Estimated Nutritional Needs:   Kcal:  2150-2350  Protein:  110-125 grams  Fluid:  > 2 L    Loistine Chance, RD, LDN, [place] Registered Dietitian II Certified Diabetes Care and Education Specialist Please refer to [facility] for RD and/or RD on-call/weekend/after hours pager

## 2021-03-09 NOTE — Progress Notes (Signed)
Inpatient Rehab Admissions Coordinator:   Per therapy recommendations pt was prescreened for CIR by Michel Santee, PT, DPT.  Note that we are out of network with pt's insurance.  If pt/family wish to pursue AIR, will need TOC to refer to another AIR facility in this area.  I will let them know.   Shann Medal, PT, DPT Admissions Coordinator [phone] 03/09/21  2:31 PM

## 2021-03-09 NOTE — Progress Notes (Signed)
Care Contact Phone Call  [place] mother, Gaye Alken, at [phone]. They were updated on Bell S Langham's clinical status and plan.  Redmond School., MSN, APRN, AGACNP-BC [facility]  03/09/2021 , 10:19 AM  Please see [email or web address] for pager details  If no response, please call [phone] After hours, please call Elink at [phone]

## 2021-03-09 NOTE — Progress Notes (Signed)
Patient projectile vomited green emesis after giving PO meds and drinking an ensure. Will keep NPO for now until I can advance diet as tolerated. CCM notified.  Montez Hageman RN

## 2021-03-09 NOTE — Progress Notes (Signed)
[place] [facility] Progress Note Patient Name: JAVAUGHN GERREN DOB: 04/22/1990 MRN: [id number]   Date of Service  03/09/2021  HPI/Events of Note  Patient is vomiting. QTc interval = 0.42 seconds. Nursing request for Zofran.  eICU Interventions  Plan: Zofran 4 mg IV Q 6 hours PRN N/V.     Intervention Category Major Interventions: Other:  Louanne Calvillo Cornelia Copa 03/09/2021, 6:05 AM

## 2021-03-09 NOTE — Evaluation (Signed)
Clinical/Bedside Swallow Evaluation Patient Details  Name: Miguel Black MRN: [id number] Date of Birth: [birth year]  Today's Date: 03/09/2021 Time: SLP Start Time (ACUTE ONLY): 0902 SLP Stop Time (ACUTE ONLY): 0920 SLP Time Calculation (min) (ACUTE ONLY): 18 min  Past Medical History:  Past Medical History:  Diagnosis Date   MS (multiple sclerosis) (HCC)    Seizure (HCC)    Status epilepticus (HCC) 07/12/2018   Past Surgical History:  Past Surgical History:  Procedure Laterality Date   NO PAST SURGERIES     HPI:  Miguel Black is a 31 y.o. male with a history of MS as well as seizure disorder who presented 2/12 with status epilepticus requiring intubation; self extubated 2/13, reintubated later that morning and self-extubated again that evening despite full restraints.    Assessment / Plan / Recommendation  Clinical Impression  Pt presents with normal oropharyngeal swallow with thorough mastication, the appearance of a brisk swallow response, and no s/s of aspiration over the course of multiple PO trials. No dysphagia. Resume a regular diet. (Pt was mildly confused, trying to suck from the spoon as if it were a straw, for example. Provide assist with tray set-up and intermittent supervision during first few meals.) No SLP f/u needed. SLP Visit Diagnosis: Dysphagia, unspecified (R13.10)    Aspiration Risk  No limitations    Diet Recommendation   Regular solids, thin liquids  Medication Administration: Whole meds with liquid    Other  Recommendations Oral Care Recommendations: Oral care BID    Recommendations for follow up therapy are one component of a multi-disciplinary discharge planning process, led by the attending physician.  Recommendations may be updated based on patient status, additional functional criteria and insurance authorization.  Follow up Recommendations No SLP follow up        Swallow Study   General Date of Onset: 03/07/21 HPI: Miguel Black  is a 31 y.o. male with a history of MS as well as seizure disorder who presented 2/12 with status epilepticus requiring intubation; self extubated 2/13, reintubated later that morning and self-extubated again that evening despite full restraints. Type of Study: Bedside Swallow Evaluation Previous Swallow Assessment: no Diet Prior to this Study: NPO Temperature Spikes Noted: No Respiratory Status: Room air History of Recent Intubation: Yes Length of Intubations (days): 2 days Date extubated: 03/08/21 Behavior/Cognition: Alert;Cooperative;Confused Oral Cavity Assessment: Within Functional Limits Oral Care Completed by SLP: No Oral Cavity - Dentition: Adequate natural dentition Vision: Functional for self-feeding Self-Feeding Abilities: Able to feed self Patient Positioning: Upright in bed Baseline Vocal Quality: Normal Volitional Cough: Weak Volitional Swallow: Able to elicit    Oral/Motor/Sensory Function Overall Oral Motor/Sensory Function: Within functional limits   Ice Chips Ice chips: Within functional limits   Thin Liquid Thin Liquid: Within functional limits    Nectar Thick Nectar Thick Liquid: Not tested   Honey Thick Honey Thick Liquid: Not tested   Puree Puree: Within functional limits   Solid     Solid: Within functional limits      Miguel Black 03/09/2021,9:23 AM  Miguel Folks L. Samson Frederic, MA CCC/SLP Acute Rehabilitation Services Office number [phone] Pager [phone]

## 2021-03-09 NOTE — Progress Notes (Signed)
Patient is being transferred to 3W, report called to the receiving RN. Patient's mom was called  and made aware.

## 2021-03-09 NOTE — Progress Notes (Addendum)
NAME:  Miguel Black, MRN:  [id number], DOB:  [birth year], LOS: 2 ADMISSION DATE:  03/07/2021, CONSULTATION DATE:  03/07/2021 REFERRING MD:  Dr. Lynelle Doctor, CHIEF COMPLAINT:  Seizure    History of Present Illness:  Miguel Black is a 31 y.o. male with a PMH significant for MS and known seizure discharge and prior status epilepticus who presented to the ED via EMS for AMS and seizures. Per report patient was seen at home today smoking marijuana when he later became unresponisve . Witnessed tonic-clonic seizure with EMS for which he was given midazolam and later required BMV in route to ED.   On ED arrival patient was seen with continues seizure activity and he was intubated for airway protection. Head CT was unremarkable. Neurotology was consulted and PCCM was called for further management and admission.   Pertinent  Medical History  MS and known seizure discharge and prior status epilepticus  Significant Hospital Events: Including procedures, antibiotic start and stop dates in addition to other pertinent events   2/12 admitted for seizures, intubated in ED, COVID + 2/13 self extubated am 12/13.  Self extubated PM 12/13.  Interim History / Subjective:  Tmax 98.6 +4L admit, 1.5L UPO,   Subjective: Denies chest pain, endorses some SOB  Objective   Blood pressure 128/74, pulse 97, temperature 97.9 F (36.6 C), temperature source Oral, resp. rate 14, height 5\' 8"  (1.727 m), weight 73 kg, SpO2 100 %.    Vent Mode: PRVC FiO2 (%):  [30 %-100 %] 40 % Set Rate:  [20 bmp-22 bmp] 22 bmp Vt Set:  [540 mL] 540 mL PEEP:  [5 cmH20] 5 cmH20 Plateau Pressure:  [17 cmH20-18 cmH20] 18 cmH20   Intake/Output Summary (Last 24 hours) at 03/09/2021 0858 Last data filed at 03/09/2021 0700 Gross per 24 hour  Intake 1660.86 ml  Output 1535 ml  Net 125.86 ml    Filed Weights   03/07/21 1315  Weight: 73 kg    Examination: General: In bed, NAD, appears comfortable HEENT: MM pink/moist, anicteric,  atraumatic Neuro: RASS 0, PERRL 28mm, GCS 15, MAE 5/5 upper and lower CV: S1S2, ST, no m/r/g appreciated PULM:  clear in the upper lobes, clear in the lower lobes, trachea midline, chest expansion symmetric GI: soft, bsx4 active, non-tender   Extremities: warm/dry, no pretibial edema, capillary refill less than 3 seconds  Skin:  no rashes or lesions noted  MG 2.1 Phos 3.1 K 4.4  Resolved Hospital Problem list   Acute Hypoxic Respiratory Failure  Assessment & Plan:  Breakthrough Seizure Acute encephalopathy secondary to  Multiple Sclerosis -Known seizure hx on home Keppra, amantadine, dalfampridine, and ocrelizumab  - seizures likely secondary to keppra non-adherence based on collateral information from family. Last dose of keppra on 2/9. Also recently started on amantadine which can lower seizure threshold. EEG negative P: -Primary management per neurology.  -Continue keppra 1mg  BID -Continue neuroprotective measures- normothermia, euglycemia, HOB greater than 30, head in neutral alignment, normocapnia, normoxia.  -Swallow eval today, mobilize today, remove foley and follow up void. If able to complete all three. Ok for D/C. -Reached out to Dr. 4/9 regarding resumption of amantidine 100mg  BID and timeline for neurology follow up.  -Continue seizure precautions -Stable for transfer to floor if unable to discharge today.  Addendum: Spoke with Dr. . Neuro recs holding amantadine and outpatient neurology follow-up in 2-4 weeks.  Acute Hypoxic Respiratory Failure- -resolved Intubated secondary to seizure. Self extubated on 2/13 x2. On room air.  -  Mobilize  -Pulm toilet as able  COVID-19 infection: POA Incidental finding. On room air. -Monitor  Hypokalemia- resolved Hypomagnesemia -resolved P: -Follow up AM BMP if not discharged.  Best Practice (right click and "Reselect all SmartList Selections" daily)   Diet/type: Regular consistency (see orders) DVT prophylaxis:  SCD GI prophylaxis: PPI Lines: N/A Foley:  N/A Code Status:  full code Last date of multidisciplinary goals of care discussion: Will call mother on 2/14   Critical care time: n/a    Gershon Mussel., MSN, APRN, AGACNP-BC [facility]  03/09/2021 , 9:08 AM  Please see [email or web address] for pager details  If no response, please call [phone] After hours, please call Elink at [phone]

## 2021-03-09 NOTE — Progress Notes (Signed)
No seizures overnight. Extubated yesterday. Wants to go home.     Past Medical History:  Diagnosis Date   MS (multiple sclerosis) ([place])    Seizure ([place])    Status epilepticus ([place]) 07/12/2018     Family History  Problem Relation Age of Onset   Hypertension Mother    Healthy Father    Healthy Sister    Healthy Brother    Diabetes Maternal Grandmother    Hypertension Maternal Grandfather    Hodgkin's lymphoma Brother    Colon cancer Maternal Uncle 45    Exam: Current vital signs: BP 128/74    Pulse 97    Temp 97.9 F (36.6 C) (Oral)    Resp 14    Ht 5\' 8"  (1.727 m)    Wt 73 kg    SpO2 100%    BMI 24.47 kg/m  Vital signs in last 24 hours: Temp:  [97.4 F (36.3 C)-98.6 F (37 C)] 97.9 F (36.6 C) (02/14 0400) Pulse Rate:  [56-114] 97 (02/14 0700) Resp:  [9-22] 14 (02/14 0700) BP: (115-158)/(62-100) 128/74 (02/14 0700) SpO2:  [99 %-100 %] 100 % (02/14 0700) FiO2 (%):  [30 %-100 %] 40 % (02/13 2027)   Physical Exam  Constitutional: Appears well-developed and well-nourished.  Cardiovascular: Normal rate and regular rhythm.  Respiratory: intubated. GI: Soft.  No distension. There is no tenderness.  Skin: WDI  Neuro: Mental Status: Alert, awake. Oriented to person, place, date (year and month) cranial Nerves: II: Pupils are equal, round, and reactive to light.  EOMI. Face symmetric. Tongue midline.   Motor: Globally weak but non focal. No drift in UE. Sensory: intact to LT. Cerebellar: no ataxia in UE.  I have reviewed the images obtained: CT head-unremarkable  LTM: 2/12-2/13: Impression: This was an abnormal continuous video EEG due to diffuse low voltage slowing, indicative of a sedated encephalopathy pattern. There were no epileptiform discharges or seizures.   Impression: 31 year old male with a history of MS and seizure disorder who presents in status epilepticus requiring intubation.   He was recently started on amantadine(not sure if he actually took it)  which can lower seizure threshold, Keppra increased. LMT neg.  Recommendations: Con't Keppra to 1 g twice daily. Can follow up outpt Neurology and maybe able to titrate down in a few weeks.  Con't home MS meds.   Will sign off.    Total of 35 mins spent reviewing chart, discussion with patient on prognosis, Dx and plan. Discussed case with patient's nurse. Reviewed Imaging personally. Discussed with Dr. Shearon Stalls.   Ioanna Colquhoun,MD

## 2021-03-09 NOTE — Progress Notes (Signed)
Patient states " I want to go home". I asked if he was willing to wait till CCM rounds and sees him and he says he will wait. CCM notified.  Sherral Hammers, RN

## 2021-03-09 NOTE — Discharge Summary (Incomplete)
Physician Discharge Summary         Patient ID: Miguel Black MRN: [id number] DOB/AGE: 30-28-1992 30 y.o.  Admit date: 03/07/2021 Discharge date: 03/09/2021  Discharge Diagnoses:    Active Hospital Problems   Diagnosis Date Noted   Seizures (HCC) 01/22/2019    Resolved Hospital Problems  No resolved problems to display.      Discharge summary    Miguel Black is a 31 y.o. male with a PMH significant for MS and known seizure discharge and prior status epilepticus who presented to the ED via EMS for AMS and seizures. Per report patient was seen at home on 2/12 smoking marijuana when he later became unresponisve. Per family he had ran out of Keppra on 2/9. He was also started on amantadine, which can lower seizure threshold, but it is unclear if he was taking. Witnessed tonic-clonic seizure with EMS for which he was given midazolam and later required BMV in route to ED.   On ED arrival patient was seen with continues seizure activity and he was intubated for airway protection. He was found to have status epilepticus.  Head CT was unremarkable. Neurotology was consulted and PCCM was called for further management and admission on 2/12. He was found to be incidentally positive for COVID-19  He self extubated on 2/13 in the AM and was re intubated. He self extubated again on 2/13 in the evening but was stable on RA.   EEG was completed while inpatient which did not show any epileptiform discharges or seizures. Neurology increased keppra to 1 MG BID, with recommendations for outpatient neurology follow up in 2-4 weeks and to stop amantadine.   He remains stable on room air. He has passed his swallow evaluation with SLP and was cleared for a regular diet. PT/OT was consulted, they recommended ***. He is stable for discharge to ***   Discharge Plan by Active Problems    Seizures Status epilepticus -Continue Keppra PO 1G BID -Follow up with outpatient neurology in 2-4  weeks -Seizure precautions. No driving for six months.  Multiple Sclerosis -Stop amantadine 100mg  BID PO and dalfampridine 10mg  tb12  PO due to risk of seizure. Discuss with neurology on visit.  -Continue Ocrevus 300mg /23mlv as scheduled every six months  - *** home PT/OT -Continue home Vitamin D3 1000u tab daily  COVID-19 infection Incidental finding -Present to the ED if any shorthness of breath, trouble breathing, change in consciousness, chest pain, nausea, voming.   Significant Hospital tests/ studies  2/12 CT head: Negative  Procedures   EEG was completed while inpatient which did not show any epileptiform discharges or seizures. Culture data/antimicrobials   COVID + 2/14   Consults  Neurology    Discharge Exam: BP 128/74    Pulse 97    Temp 97.9 F (36.6 C) (Oral)    Resp 14    Ht 5\' 8"  (1.727 m)    Wt 73 kg    SpO2 100%    BMI 24.47 kg/m   ****** Labs at discharge   Lab Results  Component Value Date   CREATININE 1.03 03/09/2021   BUN 5 (L) 03/09/2021   NA 140 03/09/2021   K 4.4 03/09/2021   CL 107 03/09/2021   CO2 18 (L) 03/09/2021   Lab Results  Component Value Date   WBC 15.0 (H) 03/08/2021   HGB 15.0 03/08/2021   HCT 44.0 03/08/2021   MCV 97.9 03/08/2021   PLT 156 03/08/2021   Lab Results  Component Value Date   ALT 15 03/07/2021   AST 27 03/07/2021   ALKPHOS 68 03/07/2021   BILITOT 0.8 03/07/2021   No results found for: INR, PROTIME  Current radiological studies    CT HEAD WO CONTRAST  Result Date: 03/07/2021 CLINICAL DATA:  No status changes. EXAM: CT HEAD WITHOUT CONTRAST TECHNIQUE: Contiguous axial images were obtained from the base of the skull through the vertex without intravenous contrast. RADIATION DOSE REDUCTION: This exam was performed according to the departmental dose-optimization program which includes automated exposure control, adjustment of the mA and/or kV according to patient size and/or use of iterative reconstruction  technique. COMPARISON:  12/03/2019.  07/12/2018. FINDINGS: Brain: There is no evidence for acute hemorrhage, hydrocephalus, mass lesion, or abnormal extra-axial fluid collection. No definite CT evidence for acute infarction. Small bilateral periventricular hypodensities are stable in the interval, compatible with patient's known history of multiple sclerosis. Small lacunar infarct left basal ganglia similar to 07/12/2018. Vascular: No hyperdense vessel or unexpected calcification. Skull: No evidence for fracture. No worrisome lytic or sclerotic lesion. Sinuses/Orbits: The visualized paranasal sinuses and mastoid air cells are clear. Visualized portions of the globes and intraorbital fat are unremarkable. Other: None. IMPRESSION: 1. No acute intracranial abnormality. 2. Stable bilateral periventricular hypodensities compatible with patient's known history of multiple sclerosis. Electronically Signed   By: Kennith Center M.D.   On: 03/07/2021 11:04   DG Chest Port 1 View  Result Date: 03/08/2021 CLINICAL DATA:  Respiratory failure EXAM: PORTABLE CHEST 1 VIEW COMPARISON:  Chest radiograph 1 day prior FINDINGS: The endotracheal tube is in the thoracic trachea proximally 2.5 from the carina. The enteric catheter tip and sidehole project over the left upper quadrant. The cardiomediastinal silhouette is stable, allowing for leftward patient rotation. There is new retrocardiac opacity. There is no other new or worsening focal airspace disease. There is no significant pleural effusion. There is no pneumothorax. The bones are stable. IMPRESSION: New retrocardiac opacity may reflect left lower lobe atelectasis, infection, or aspiration. Electronically Signed   By: Lesia Hausen M.D.   On: 03/08/2021 12:21   DG Chest Portable 1 View  Result Date: 03/07/2021 CLINICAL DATA:  Intubation EXAM: PORTABLE CHEST 1 VIEW COMPARISON:  07/11/2021 FINDINGS: Endotracheal tube is positioned with tip just below the thoracic inlet.  Esophagogastric tube with tip and side port below the diaphragm. The heart size and mediastinal contours are within normal limits. Both lungs are clear. The visualized skeletal structures are unremarkable. IMPRESSION: 1. Endotracheal tube is positioned with tip just below the thoracic inlet. 2.  Esophagogastric tube with tip and side port below the diaphragm. 3.  No acute abnormality of the lungs. Electronically Signed   By: Jearld Lesch M.D.   On: 03/07/2021 10:39   Overnight EEG with video  Result Date: 03/08/2021 Lind Guest, MD     03/08/2021  8:15 AM EEG Procedure CPT/Type of Study: 95720; 24hr EEG with video Referring Provider: Celine Mans Primary Neurological Diagnosis: status epilepticus History: This is a 31 yr old patient, undergoing an EEG to evaluate for status epilepticus. Clinical State: obtunded Technical Description: The EEG was performed using standard setting per the guidelines of American Clinical Neurophysiology Society (ACNS). A minimum of 21 electrodes were placed on scalp according to the International 10-20 or/and 10-10 Systems. Supplemental electrodes were placed as needed. Single EKG electrode was also used to detect cardiac arrhythmia. Patient's behavior was continuously recorded on video simultaneously with EEG. A minimum of 16 channels were used for  data display. Each epoch of study was reviewed manually daily and as needed using standard referential and bipolar montages. Computerized quantitative EEG analysis (such as compressed spectral array analysis, trending, automated spike & seizure detection) were used as indicated. Day 1: from 1145 03/07/21 to 0730 03/08/21 EEG Description: Overall Amplitude: Low Predominant Frequency: The background activity showed low voltage delta-theta activity, with about 1-4 Hz, that was rare. Superimposed Frequencies: none The background was symmetric Background Abnormalities: Generalized slowing Rhythmic or periodic pattern: No Epileptiform activity: no  Electrographic seizures: no Events: no Breach rhythm: no Reactivity: Present Stimulation procedures: Hyperventilation: not done Photic stimulation: not done Sleep Background: none EKG:no significant arrhythmia Impression: This was an abnormal continuous video EEG due to diffuse low voltage slowing, indicative of a sedated encephalopathy pattern. There were no epileptiform discharges or seizures.    Disposition:        Allergies as of 03/09/2021   No Known Allergies   Med Rec must be completed prior to using this [facility]***        Follow-up appointment   Follow up with outpatient neurology in 2-4 weeks Follow up with PCP within the year Discharge Condition:    good  Physician Statement:   The Patient was personally examined, the discharge assessment and plan has been personally reviewed and I agree with NP Thai Hemrick's assessment and plan. 36 minutes of time have been dedicated to discharge assessment, planning and discharge instructions.   Signed: Eliezer Champagne 03/09/2021, 9:24 AM

## 2021-03-09 NOTE — Evaluation (Signed)
Occupational Therapy Evaluation Patient Details Name: Miguel Black MRN: [id number] DOB: 16-Mar-1990 Today's Date: 03/09/2021   History of Present Illness Pt is 31 yo male with breakthrough seizure and found to be covid +. Self extubated 2/13 am and had to be reintubated for desaturation and somnolence. Pt again self extubated 2/13 pm. PMH: MS, seizures, regular marijuana use   Clinical Impression   PTA pt lives with his Mom, does not work or drive. As noted in PT eval, pt with immature behaviors during session with decreased insight/awareness of deficits and impact on functional status. Pt with incoordinated movements BU/LE, generalized L sided weakness from MS and apparent cognitive deficits, decreasing his independence and safety with ADL tasks. Required +2 Mod A to safely ambulate to the bathroom and prevent multiple near falls. Recommend post acute rehab at this time. If pt progresses, may be able to DC home with [facility] or outpt services. Will follow acutely to facilitate safe DC to next venue of care.      Recommendations for follow up therapy are one component of a multi-disciplinary discharge planning process, led by the attending physician.  Recommendations may be updated based on patient status, additional functional criteria and insurance authorization.   Follow Up Recommendations  Acute inpatient rehab (3hours/day)    Assistance Recommended at Discharge Frequent or constant Supervision/Assistance  Patient can return home with the following Two people to help with walking and/or transfers;Two people to help with bathing/dressing/bathroom;Assistance with feeding;Assistance with cooking/housework;Direct supervision/assist for medications management;Direct supervision/assist for financial management;Assist for transportation;Help with stairs or ramp for entrance    Functional Status Assessment  Patient has had a recent decline in their functional status and demonstrates the ability to  make significant improvements in function in a reasonable and predictable amount of time.  Equipment Recommendations  BSC/3in1    Recommendations for Other Services Rehab consult     Precautions / Restrictions Precautions Precautions: Fall Restrictions Weight Bearing Restrictions: No      Mobility Bed Mobility Overal bed mobility: Needs Assistance Bed Mobility: Supine to Sit, Sit to Supine     Supine to sit: Min assist Sit to supine: Min assist   General bed mobility comments: pt uses proximal momentum to "throw" LE's out of and back into bed. Min A for safety to prevent pt rolling off bed. Pt impulsive with all mvmt    Transfers Overall transfer level: Needs assistance Equipment used: Rolling walker (2 wheels) Transfers: Sit to/from Stand Sit to Stand: +2 physical assistance, Mod assist           General transfer comment: mod A for power up and to steady. Pt needed cues to grasp RW with L hand as he kept letting go and using hands to gesticulate      Balance Overall balance assessment: Needs assistance Sitting-balance support: Feet supported Sitting balance-Leahy Scale: Fair   Postural control: Posterior lean, Right lateral lean, Left lateral lean Standing balance support: No upper extremity supported Standing balance-Leahy Scale: Poor Standing balance comment: pt with several LOB when standing at sink, mod A to correct. Pt lunged fwd to drink water from faucette and bumped forehead on faucet. Decreased awareness of safety concerns with this                           ADL either performed or assessed with clinical judgement   ADL Overall ADL's : Needs assistance/impaired     Grooming: Moderate assistance Grooming Details (  indicate cue type and reason): mod VC for sequencing adn task completion; unaware of putting a huge amount of toothpaste on brush; left water running; required VC to spit out toothpaste and rinse mouth; when brushing teeth, instead  of using cup placed beside him, he impulsively through his head forward into the sink and hit his L eye as therapist blocked him from hitting it harder Upper Body Bathing: Minimal assistance   Lower Body Bathing: Moderate assistance   Upper Body Dressing : Moderate assistance   Lower Body Dressing: Moderate assistance   Toilet Transfer: Moderate assistance;Ambulation;Rolling walker (2 wheels)   Toileting- Clothing Manipulation and Hygiene: Moderate assistance       Functional mobility during ADLs: Moderate assistance;+2 for safety/equipment;+2 for physical assistance;Rolling walker (2 wheels);Cueing for sequencing;Cueing for safety       Vision Baseline Vision/History:  (will further assess)       Perception     Praxis      Pertinent Vitals/Pain Pain Assessment Pain Assessment: No/denies pain     Hand Dominance Left   Extremity/Trunk Assessment Upper Extremity Assessment Upper Extremity Assessment: Generalized weakness;RUE deficits/detail;LUE deficits/detail RUE Deficits / Details: uncoordinated movements/apparent sensory motor deficits; difficulty with fine motor skills RUE Coordination: decreased fine motor LUE Deficits / Details: weaker at baseline; uncoordinated movements; difficulty with fine motor tasks LUE Coordination: decreased fine motor   Lower Extremity Assessment Lower Extremity Assessment: Defer to PT evaluation   Cervical / Trunk Assessment Cervical / Trunk Assessment: Other exceptions (would not stand up completely, pushing RW out in front of him unsafely; would not stand at sink to brush teeth, however leaning over unsafely and impulsively)   Communication Communication Communication: No difficulties   Cognition Arousal/Alertness: Awake/alert Behavior During Therapy: Impulsive Overall Cognitive Status: No family/caregiver present to determine baseline cognitive functioning                                 General Comments: pt very  unsafe with mobility and seems completely unaware of lack of safety. Pt repetitively asking when his mom will get here even after he was told that RN spoke with his mother and she would be up soon. Childlike demeanor. Poor insight into deficits     General Comments  VSS on RA    Exercises     Shoulder Instructions      Home Living Family/patient expects to be discharged to:: Private residence Living Arrangements: Parent Available Help at Discharge: Family;Available 24 hours/day Type of Home: Apartment (townhome) Home Access: Stairs to enter [employer] of Steps: 8 Entrance Stairs-Rails: Left Home Layout: Two level Alternate Level Stairs-Number of Steps: flight Alternate Level Stairs-Rails: Right Bathroom Shower/Tub: Chief Strategy Officer: Handicapped height     Home Equipment: Cane - single point   Additional Comments: mom works from home, pt does not work      Prior Functioning/Environment Prior Level of Function : Independent/Modified Independent             Mobility Comments: pt reports he does not use his cane in his home. Does not drive ADLs Comments: independent        OT Problem List: Decreased strength;Decreased activity tolerance;Impaired balance (sitting and/or standing);Decreased coordination;Decreased cognition;Decreased safety awareness;Decreased knowledge of use of DME or AE;Impaired tone;Impaired sensation;Impaired UE functional use      OT Treatment/Interventions: Self-care/ADL training;Therapeutic exercise;Neuromuscular education;DME and/or AE instruction;Therapeutic activities;Cognitive remediation/compensation;Visual/perceptual remediation/compensation;Patient/family education;Balance training  OT Goals(Current goals can be found in the care plan section) Acute Rehab OT Goals Patient Stated Goal: to go home OT Goal Formulation: Patient unable to participate in goal setting Time For Goal Achievement: 03/23/21 Potential  to Achieve Goals: Good  OT Frequency: Min 2X/week    Co-evaluation PT/OT/SLP Co-Evaluation/Treatment: Yes Reason for Co-Treatment: Complexity of the patient's impairments (multi-system involvement);Necessary to address cognition/behavior during functional activity;For patient/therapist safety;To address functional/ADL transfers PT goals addressed during session: Mobility/safety with mobility;Balance;Proper use of DME OT goals addressed during session: ADL's and self-care      AM-PAC OT "6 Clicks" Daily Activity     Outcome Measure Help from another person eating meals?: A Lot Help from another person taking care of personal grooming?: A Lot Help from another person toileting, which includes using toliet, bedpan, or urinal?: A Lot Help from another person bathing (including washing, rinsing, drying)?: A Lot Help from another person to put on and taking off regular upper body clothing?: A Lot Help from another person to put on and taking off regular lower body clothing?: A Lot 6 Click Score: 12   End of Session Equipment Utilized During Treatment: Gait belt;Rolling walker (2 wheels) Nurse Communication: Mobility status  Activity Tolerance: Patient tolerated treatment well Patient left: in bed;with call bell/phone within reach;with bed alarm set  OT Visit Diagnosis: Unsteadiness on feet (R26.81);Other abnormalities of gait and mobility (R26.89);Muscle weakness (generalized) (M62.81);Ataxia, unspecified (R27.0);Other symptoms and signs involving the nervous system (R29.898)                Time: 1610-9604 OT Time Calculation (min): 31 min Charges:  OT General Charges $OT Visit: 1 Visit OT Evaluation $OT Eval Moderate Complexity: 1 Mod  Aldean Suddeth, OT/L   Acute OT Clinical Specialist Acute Rehabilitation Services Pager [phone] Office [phone]   [facility] 03/09/2021, 4:00 PM

## 2021-03-09 NOTE — TOC Initial Note (Signed)
Transition of Care [facility]) - Initial/Assessment Note    Patient Details  Name: Miguel Black MRN: [id number] Date of Birth: 1990/06/02  [facility]) CM/SW Contact:    Ella Bodo, RN Phone Number: 03/09/2021, 5:01 PM  Clinical Narrative:                 Pt is 31 yo male with breakthrough seizure and found to be covid +. Self extubated 2/13 am and had to be reintubated for desaturation and somnolence. Pt again self extubated 2/13 pm. PT/OT recommending CIR; CIR admissions liaison states that [facility] not in network with patient's insurance. Spoke with patient, who was unable to give me much information about his plans for discharge. He gives permission to speak with his mother regarding discharge planning.   Spoke with pt's mother, Miguel Black: she states that pt lives with her and her husband (patient's father). She works from home, and father is available 24h/day.  She states patient uses a cane to ambulate at times, mainly when in the community.  He is able to perform all ADLS himself.  We discussed possible options for AIR, including possibly [facility] in [place], or [facility] in [place].  She states she is hesitant to agree to IP rehab, as patient wants to go home badly. She states patient had issues with ambulation, balance in the past after having a seizure, but he recovered within a few days.  She believes that he will be able to discharge home with parents with continued time and therapies.  For now, she declines inpatient rehab, and wishes to see how he does in "a day or two."  TOC will continue to follow/assist with disposition.     Barriers to Discharge: Continued Medical Work up   Patient Goals and CMS Choice Patient states their goals for this hospitalization and ongoing recovery are:: to go home      Expected Discharge Plan and Services     Discharge Planning Services: CM Consult   Living arrangements for the past 2 months: Single Family Home                                       Prior Living Arrangements/Services Living arrangements for the past 2 months: Single Family Home Lives with:: Parents   Do you feel safe going back to the place where you live?: Yes      Need for Family Participation in Patient Care: Yes (Comment) Care giver support system in place?: Yes (comment) Current home services: DME (cane) Criminal Activity/Legal Involvement Pertinent to Current Situation/Hospitalization: No - Comment as needed  Activities of Daily Living      Permission Sought/Granted   Permission granted to share information with : Yes, Verbal Permission Granted  Share Information with NAME: Miguel Black     Permission granted to share info w Relationship: mother  Permission granted to share info w Contact Information: [phone]  Emotional Assessment   Attitude/Demeanor/Rapport: Engaged Affect (typically observed): Accepting Orientation: : Oriented to Self, Oriented to Place, Oriented to Situation      Admission diagnosis:  Status epilepticus ([place]) [G40.901] Seizures ([place]) [R56.9] Patient Active Problem List   Diagnosis Date Noted   History of optic neuritis 07/29/2020   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   High risk medication use 01/28/2020   Seizure disorder ([place]) 12/04/2019   Marijuana use 12/04/2019   Seizures (  [place]) 01/22/2019   Gait disturbance 01/22/2019   Left-sided weakness 01/22/2019   Other fatigue 01/22/2019   Relapsing remitting multiple sclerosis ([place]) 07/12/2018   Encounter for screening for HIV 07/12/2018   PCP:  [name], MD (Inactive) Pharmacy:   [id number] - [place], [address] [place] [address] Phone: [phone] Fax: [phone]     Social Determinants of Health (SDOH) Interventions    Readmission Risk Interventions No flowsheet data found.  Reinaldo Raddle, RN, BSN  Trauma/Neuro ICU Case  Manager [phone]

## 2021-03-09 NOTE — Progress Notes (Signed)
Evaluated patient after extubation.  Awake and oriented, no respiratory distress.  ON NRB, O2 sat 100%.   A little sleepy, but fully arousable to voice.   Speaking clearly, asking when he can go home.   Patient had been on precedex when self extubated. Precedex now off.  No need for intervention or reintubation.

## 2021-03-09 NOTE — Evaluation (Signed)
Physical Therapy Evaluation Patient Details Name: Miguel Black MRN: [id number] DOB: May 01, 1990 Today's Date: 03/09/2021  History of Present Illness  Pt is 31 yo male with breakthrough seizure and found to be covid +. Self extubated 2/13 am and had to be reintubated for desaturation and somnolence. Pt again self extubated 2/13 pm. PMH: MS, seizures, regular marijuana use  Clinical Impression  Pt admitted with above diagnosis. Pt very childlike on eval, asking repetitively when his mother would arrive even after he was told that she told his nurse she would be up later. He said he did not want to do anything until she got here however, encouraged otherwise. Pt with little to no insight into deficits and decreased safety. Pt needed min A to prevent rolling off EOB with transfer into sitting. He required mod A +2 to stand to RW and ambulate into bathroom. Pt with multiple LOB without self correction. Unsure of pt's baseline cognition but per his report he ambulated around his home as well as going upstairs to his bedroom. Would not currently be able to do this safely. Recommending AIR level therapies to return to PLOF. Pt currently with functional limitations due to the deficits listed below (see PT Problem List). Pt will benefit from skilled PT to increase their independence and safety with mobility to allow discharge to the venue listed below.          Recommendations for follow up therapy are one component of a multi-disciplinary discharge planning process, led by the attending physician.  Recommendations may be updated based on patient status, additional functional criteria and insurance authorization.  Follow Up Recommendations Acute inpatient rehab (3hours/day)    Assistance Recommended at Discharge Frequent or constant Supervision/Assistance  Patient can return home with the following  A lot of help with bathing/dressing/bathroom;A lot of help with walking and/or transfers;Help with stairs  or ramp for entrance;Direct supervision/assist for financial management;Direct supervision/assist for medications management;Assistance with feeding;Assistance with cooking/housework;Assist for transportation    Equipment Recommendations Other (comment) (TBD)  Recommendations for Other Services  Rehab consult    Functional Status Assessment Patient has had a recent decline in their functional status and demonstrates the ability to make significant improvements in function in a reasonable and predictable amount of time.     Precautions / Restrictions Precautions Precautions: Fall Restrictions Weight Bearing Restrictions: No      Mobility  Bed Mobility Overal bed mobility: Needs Assistance Bed Mobility: Supine to Sit, Sit to Supine     Supine to sit: Min assist Sit to supine: Min assist   General bed mobility comments: pt uses proximal momentum to "throw" LE's out of and back into bed. Min A for safety to prevent pt rolling off bed. Pt impulsive with all mvmt    Transfers Overall transfer level: Needs assistance Equipment used: Rolling walker (2 wheels) Transfers: Sit to/from Stand Sit to Stand: +2 physical assistance, Mod assist           General transfer comment: mod A for power up and to steady. Pt needed cues to grasp RW with L hand as he kept letting go and using hands to gesticulate    Ambulation/Gait Ambulation/Gait assistance: Mod assist, +2 physical assistance Gait Distance (Feet): 7 Feet (2x) Assistive device: Rolling walker (2 wheels)   Gait velocity: decreased Gait velocity interpretation: <1.31 ft/sec, indicative of household ambulator   General Gait Details: Pt with BLE weakness, dragging both feet and staggering, not staying in the RW despite cues, LOB to  both sides and posterior. Mod/ max A on each side to keep pt upright  Stairs            Wheelchair Mobility    Modified Rankin (Stroke Patients Only)       Balance Overall balance  assessment: Needs assistance Sitting-balance support: Feet supported Sitting balance-Leahy Scale: Fair   Postural control: Posterior lean, Right lateral lean, Left lateral lean Standing balance support: No upper extremity supported Standing balance-Leahy Scale: Poor Standing balance comment: pt with several LOB when standing at sink, mod A to correct. Pt lunged fwd to drink water from faucette and bumped forehead on faucet. Decreased awareness of safety concerns with this                             Pertinent Vitals/Pain Pain Assessment Pain Assessment: No/denies pain    Home Living Family/patient expects to be discharged to:: Private residence Living Arrangements: Parent Available Help at Discharge: Family;Available 24 hours/day Type of Home: Apartment (townhome) Home Access: Stairs to enter Entrance Stairs-Rails: Left Entrance Stairs-Number of Steps: 8 Alternate Level Stairs-Number of Steps: flight Home Layout: Two level Home Equipment: Cane - single point Additional Comments: mom works from home, pt does not work    Prior Function Prior Level of Function : Independent/Modified Independent             Mobility Comments: pt reports he does not use his cane in his home. Does not drive ADLs Comments: independent     Hand Dominance   Dominant Hand: Left    Extremity/Trunk Assessment   Upper Extremity Assessment Upper Extremity Assessment: Defer to OT evaluation    Lower Extremity Assessment Lower Extremity Assessment: Generalized weakness (L>R)    Cervical / Trunk Assessment Cervical / Trunk Assessment: Normal  Communication   Communication: No difficulties  Cognition Arousal/Alertness: Awake/alert Behavior During Therapy: Impulsive Overall Cognitive Status: No family/caregiver present to determine baseline cognitive functioning                                 General Comments: pt very unsafe with mobility and seems completely  unaware of lack of safety. Pt repetitively asking when his mom will get here even after he was told that RN spoke with his mother and she would be up soon. Childlike demeanor. Poor insight into deficits        General Comments General comments (skin integrity, edema, etc.): VSS    Exercises     Assessment/Plan    PT Assessment Patient needs continued PT services  PT Problem List Decreased balance;Decreased activity tolerance;Decreased strength;Decreased mobility;Decreased coordination;Decreased cognition;Decreased knowledge of use of DME;Decreased safety awareness;Decreased knowledge of precautions       PT Treatment Interventions DME instruction;Gait training;Stair training;Functional mobility training;Therapeutic activities;Therapeutic exercise;Balance training;Neuromuscular re-education;Cognitive remediation;Patient/family education    PT Goals (Current goals can be found in the Care Plan section)  Acute Rehab PT Goals Patient Stated Goal: go home today PT Goal Formulation: With patient Time For Goal Achievement: 03/23/21 Potential to Achieve Goals: Good    Frequency Min 3X/week     Co-evaluation PT/OT/SLP Co-Evaluation/Treatment: Yes Reason for Co-Treatment: Complexity of the patient's impairments (multi-system involvement);Necessary to address cognition/behavior during functional activity;For patient/therapist safety PT goals addressed during session: Mobility/safety with mobility;Balance;Proper use of DME         AM-PAC PT "6 Clicks" Mobility  Outcome Measure Help needed  turning from your back to your side while in a flat bed without using bedrails?: A Little Help needed moving from lying on your back to sitting on the side of a flat bed without using bedrails?: A Little Help needed moving to and from a bed to a chair (including a wheelchair)?: Total Help needed standing up from a chair using your arms (e.g., wheelchair or bedside chair)?: Total Help needed to walk  in hospital room?: Total Help needed climbing 3-5 steps with a railing? : Total 6 Click Score: 10    End of Session Equipment Utilized During Treatment: Gait belt Activity Tolerance: Other (comment) (limited by cognitive deficits) Patient left: in bed;with call bell/phone within reach;with bed alarm set Nurse Communication: Mobility status PT Visit Diagnosis: Unsteadiness on feet (R26.81);Muscle weakness (generalized) (M62.81);Difficulty in walking, not elsewhere classified (R26.2)    Time: 1027-2536 PT Time Calculation (min) (ACUTE ONLY): 37 min   Charges:   PT Evaluation $PT Eval Moderate Complexity: 1 Mod          Lyanne Co, PT  Acute Rehab Services  Pager [phone] Office [phone]   Lawana Chambers Gabbrielle Mcnicholas 03/09/2021, 1:53 PM

## 2021-03-10 LAB — CBC
HCT: 41.1 % (ref 39.0–52.0)
Hemoglobin: 13.6 g/dL (ref 13.0–17.0)
MCH: 31.7 pg (ref 26.0–34.0)
MCHC: 33.1 g/dL (ref 30.0–36.0)
MCV: 95.8 fL (ref 80.0–100.0)
Platelets: 185 10*3/uL (ref 150–400)
RBC: 4.29 MIL/uL (ref 4.22–5.81)
RDW: 13.4 % (ref 11.5–15.5)
WBC: 11.4 10*3/uL — ABNORMAL HIGH (ref 4.0–10.5)
nRBC: 0 % (ref 0.0–0.2)

## 2021-03-10 LAB — BASIC METABOLIC PANEL
Anion gap: 12 (ref 5–15)
BUN: <5 mg/dL — ABNORMAL LOW (ref 6–20)
CO2: 19 mmol/L — ABNORMAL LOW (ref 22–32)
Calcium: 8.3 mg/dL — ABNORMAL LOW (ref 8.9–10.3)
Chloride: 108 mmol/L (ref 98–111)
Creatinine, Ser: 0.91 mg/dL (ref 0.61–1.24)
GFR, Estimated: >60 mL/min (ref >60)
Glucose, Bld: 72 mg/dL (ref 70–99)
Potassium: 3.5 mmol/L (ref 3.5–5.1)
Sodium: 139 mmol/L (ref 135–145)

## 2021-03-10 LAB — PHOSPHORUS: Phosphorus: 2.1 mg/dL — ABNORMAL LOW (ref 2.5–4.6)

## 2021-03-10 LAB — MAGNESIUM: Magnesium: 1.7 mg/dL (ref 1.7–2.4)

## 2021-03-10 MED ORDER — LEVETIRACETAM 1000 MG PO TABS: 1000.0000 mg | ORAL_TABLET | Freq: Two times a day (BID) | ORAL | 1 refills | Status: DC

## 2021-03-10 NOTE — Discharge Summary (Signed)
DISCHARGE SUMMARY  Miguel Black  MR#: [id number]  DOB:1990/02/09  Date of Admission: 03/07/2021 Date of Discharge: 03/10/2021  Attending Physician:Devaris Quirk Hennie Duos, MD  Patient's NV:[id number], Ander Gaster, MD (Inactive)  Consults: Neurology PCCM  Disposition: D/C home   Date of Positive COVID Test: 03/07/21  Date Quarantine Ends: 03/13/21 if remains asymptomatic   COVID-19 specific Treatment: None indicated - incidental positive   Follow-up Appts:  Follow-up Information     [facility]. Schedule an appointment as soon as possible for a visit in 1 week(s).   Specialty: Rehabilitation Contact information: [place] [address] [place], [address] [place] [id number] [place] [phone]        [name], MD Follow up in 1 week(s).   Specialty: Family Medicine Contact information: [place]  [id number] [phone]                 Tests Needing Follow-up: -decide if amantadine should be resumed   Discharge Diagnoses: Breakthrough seizures Acute encephalopathy Multiple sclerosis Acute hypoxic respiratory failure Incidental COVID-19 infection Hypokalemia Hypomagnesemia  Initial presentation: 30yo with a history of MS and seizure disorder who presented to the ED via EMS with altered mental status and apparent seizure activity.  EMS witnessed tonic-clonic seizure activity which required Versed as well as BMV in route to the ED.  Upon arrival in the ED he continued to have seizure activity and required intubation.  CT head was unremarkable.  Of note he was found to be COVID-positive at the time of his admission 2/12.  Hospital Course:  Breakthrough seizures Felt to be due to nonadherence with prescribed Keppra - management directed by Neurology - keppra dosed at 1g BID w/ suggestion dose could possibly be decreased in outpt neurologic followup    Acute encephalopathy Due to frequent seizure  activity/postictal state - resolved with mental status returned to baseline at time of discharge   Multiple sclerosis Amantadine on hold as it is known to lower seizure threshold -follow-up with neurology as outpatient   Acute hypoxic respiratory failure Due to seizure activity -self extubated x2 -has remained stable on room air postextubation   Incidental COVID-19 infection No indication for treatment   Hypokalemia Corrected   Hypomagnesemia Corrected  Allergies as of 03/10/2021   No Known Allergies      Medication List     STOP taking these medications    amantadine 100 MG capsule Commonly known as: SYMMETREL       TAKE these medications    cholecalciferol 25 MCG (1000 UNIT) tablet Commonly known as: VITAMIN D3 Take 5,000 Units by mouth daily.   dalfampridine 10 MG Tb12 Take 1 tablet (10 mg total) by mouth 2 (two) times daily.   levETIRAcetam 1000 MG tablet Commonly known as: KEPPRA Take 1 tablet (1,000 mg total) by mouth 2 (two) times daily. What changed:  medication strength how much to take   Ocrevus 300 MG/10ML injection Generic drug: ocrelizumab Inject 20 mLs (600 mg total) into the vein every 6 (six) months. 600 mg every 6 months.               Durable Medical Equipment  (From admission, onward)           Start     Ordered   03/10/21 1420  For home use only DME 3 n 1  Once        03/10/21 1420   03/10/21 1420  For home use only DME Walker rolling  Once       Question Answer Comment  Walker: With 5 Inch Wheels   Patient needs a walker to treat with the following condition Weakness      03/10/21 1420            Day of Discharge BP 136/82 (BP Location: Left Arm)    Pulse 68    Temp 99.2 F (37.3 C) (Oral)    Resp 20    Ht 5\' 8"  (1.727 m)    Wt 71.9 kg    SpO2 96%    BMI 24.10 kg/m   Physical Exam: General: No acute respiratory distress Lungs: Clear to auscultation bilaterally without wheezes or crackles Cardiovascular:  Regular rate and rhythm without murmur gallop or rub normal S1 and S2 Abdomen: Nontender, nondistended, soft, bowel sounds positive, no rebound, no ascites, no appreciable mass Extremities: No significant cyanosis, clubbing, or edema bilateral lower extremities  Basic Metabolic Panel: Recent Labs  Lab 03/07/21 0930 03/07/21 1048 03/08/21 0553 03/08/21 1253 03/09/21 0719 03/09/21 0727 03/10/21 0611  NA 138 139 139 139  --  140 139  K 4.7 4.0 3.5 3.2*  --  4.4 3.5  CL 103  --  108  --   --  107 108  CO2 20*  --  21*  --   --  18* 19*  GLUCOSE 116*  --  76  --   --  66* 72  BUN 10  --  8  --   --  5* <5*  CREATININE 1.18  --  1.14  --   --  1.03 0.91  CALCIUM 8.8*  --  7.7*  --   --  8.1* 8.3*  MG  --   --  1.4*  --  2.1  --  1.7  PHOS  --   --  3.5  --  3.1  --  2.1*    Liver Function Tests: Recent Labs  Lab 03/07/21 0930  AST 27  ALT 15  ALKPHOS 68  BILITOT 0.8  PROT 7.7  ALBUMIN 4.6    CBC: Recent Labs  Lab 03/07/21 0930 03/07/21 1048 03/08/21 0553 03/08/21 1253 03/10/21 0611  WBC 8.0  --  15.0*  --  11.4*  NEUTROABS 4.8  --   --   --   --   HGB 16.9 13.9 13.6 15.0 13.6  HCT 55.1* 41.0 41.8 44.0 41.1  MCV 105.8*  --  97.9  --  95.8  PLT 217  --  156  --  185    CBG: Recent Labs  Lab 03/08/21 2015 03/08/21 2313 03/09/21 0400  GLUCAP 85 94 73    Recent Results (from the past 240 hour(s))  MRSA Next Gen by PCR, Nasal     Status: None   Collection Time: 03/07/21  1:48 PM   Specimen: Nasal Mucosa; Nasal Swab  Result Value Ref Range Status   MRSA by PCR Next Gen NOT DETECTED NOT DETECTED Final    Comment: (NOTE) The GeneXpert MRSA Assay (FDA approved for NASAL specimens only), is one component of a comprehensive MRSA colonization surveillance program. It is not intended to diagnose MRSA infection nor to guide or monitor treatment for MRSA infections. Test performance is not FDA approved in patients less than 90 years old. Performed at [facility], [place] [address], [place], [place] [address]   Resp Panel by RT-PCR (Flu A&B, Covid) Nasopharyngeal Swab     Status: Abnormal   Collection Time:  03/07/21  3:46 PM   Specimen: Nasopharyngeal Swab; Nasopharyngeal(NP) swabs in vial transport medium  Result Value Ref Range Status   SARS Coronavirus 2 by RT PCR POSITIVE (A) NEGATIVE Final    Comment: (NOTE) SARS-CoV-2 target nucleic acids are DETECTED.  The SARS-CoV-2 RNA is generally detectable in upper respiratory specimens during the acute phase of infection. Positive results are indicative of the presence of the identified virus, but do not rule out bacterial infection or co-infection with other pathogens not detected by the test. Clinical correlation with patient history and other diagnostic information is necessary to determine patient infection status. The expected result is Negative.  Fact Sheet for Patients: EntrepreneurPulse.com.au  Fact Sheet for Healthcare Providers: IncredibleEmployment.be  This test is not yet approved or cleared by the [place] FDA and  has been authorized for detection and/or diagnosis of SARS-CoV-2 by FDA under an Emergency Use Authorization (EUA).  This EUA will remain in effect (meaning this test can be used) for the duration of  the COVID-19 declaration under Section 564(b)(1) of the A ct, 21 U.S.C. section 360bbb-3(b)(1), unless the authorization is terminated or revoked sooner.     Influenza A by PCR NEGATIVE NEGATIVE Final   Influenza B by PCR NEGATIVE NEGATIVE Final    Comment: (NOTE) The Xpert Xpress SARS-CoV-2/FLU/RSV plus assay is intended as an aid in the diagnosis of influenza from Nasopharyngeal swab specimens and should not be used as a sole basis for treatment. Nasal washings and aspirates are unacceptable for Xpert Xpress SARS-CoV-2/FLU/RSV testing.  Fact Sheet for Patients: EntrepreneurPulse.com.au  Fact Sheet  for Healthcare Providers: IncredibleEmployment.be  This test is not yet approved or cleared by the [place] FDA and has been authorized for detection and/or diagnosis of SARS-CoV-2 by FDA under an Emergency Use Authorization (EUA). This EUA will remain in effect (meaning this test can be used) for the duration of the COVID-19 declaration under Section 564(b)(1) of the Act, 21 U.S.C. section 360bbb-3(b)(1), unless the authorization is terminated or revoked.  Performed at [facility], [place] [address] [place], [place] [address]      Time spent in discharge (includes decision making & examination of pt): 35 minutes  03/10/2021, 6:35 PM   Cherene Altes, MD [facility] Office  [phone]

## 2021-03-10 NOTE — Plan of Care (Signed)

## 2021-03-10 NOTE — Progress Notes (Signed)
Occupational Therapy Treatment Patient Details Name: Miguel Black MRN: [id number] DOB: Jun 15, 1990 Today's Date: 03/10/2021   History of present illness Pt is 31 yo male with breakthrough seizure and found to be covid +. Self extubated 2/13 am and had to be reintubated for desaturation and somnolence. Pt again self extubated 2/13 pm. PMH: MS, seizures, regular marijuana use   OT comments  Pt with improved ability to mobilize and complete ADL safely today. Pt does not appear at baseline however and would benefit from follow up OT/PT at the neuro outpt center. Spoke with Mom over the phone and she states they can provide direct assistance with ADL and mobility. Discussed with MD and CM.   Recommendations for follow up therapy are one component of a multi-disciplinary discharge planning process, led by the attending physician.  Recommendations may be updated based on patient status, additional functional criteria and insurance authorization.    Follow Up Recommendations  Outpatient OT    Assistance Recommended at Discharge Frequent or constant Supervision/Assistance  Patient can return home with the following  A little help with walking and/or transfers;A little help with bathing/dressing/bathroom;Assistance with cooking/housework;Assistance with feeding;Direct supervision/assist for medications management;Direct supervision/assist for financial management;Assist for transportation;Help with stairs or ramp for entrance   Equipment Recommendations  BSC/3in1;Other (comment) (RW)    Recommendations for Other Services      Precautions / Restrictions Precautions Precautions: Fall       Mobility Bed Mobility Overal bed mobility: Needs Assistance       Supine to sit: Supervision          Transfers Overall transfer level: Needs assistance     Sit to Stand: Min guard                 Balance Overall balance assessment: Needs assistance   Sitting balance-Leahy Scale:  Fair       Standing balance-Leahy Scale: Poor                             ADL either performed or assessed with clinical judgement   ADL                                         General ADL Comments: ambulates with minguard assist; abnormal gait pattern at baseline; more unsteady but safer than yesterday's session; completeing ADL with minguard assist for UB adn Min A for LB    Extremity/Trunk Assessment Upper Extremity Assessment Upper Extremity Assessment: RUE deficits/detail RUE Deficits / Details: RUE more uncoordinated than baseline; Mom agrees; using funcitonally however LUE Deficits / Details: weaker at baseline; uncoordinated movements; difficulty with fine motor tasks            Vision       Perception     Praxis      Cognition Arousal/Alertness: Awake/alert Behavior During Therapy: WFL for tasks assessed/performed Overall Cognitive Status: Impaired/Different from baseline Area of Impairment: Memory, Problem solving, Awareness, Safety/judgement, Attention                   Current Attention Level: Sustained Memory: Decreased short-term memory   Safety/Judgement: Decreased awareness of safety, Decreased awareness of deficits Awareness: Emergent Problem Solving: Slow processing          Exercises      Shoulder Instructions       General  Comments      Pertinent Vitals/ Pain       Pain Assessment Pain Assessment: No/denies pain  Home Living                                          Prior Functioning/Environment              Frequency  Min 2X/week        Progress Toward Goals  OT Goals(current goals can now be found in the care plan section)  Progress towards OT goals: Progressing toward goals  Acute Rehab OT Goals Patient Stated Goal: to go home today OT Goal Formulation: With patient/family Time For Goal Achievement: 03/23/21 Potential to Achieve Goals: Good ADL Goals Pt  Will Perform Grooming: with set-up;sitting Pt Will Perform Upper Body Bathing: with set-up;sitting Pt Will Perform Lower Body Bathing: with supervision;sit to/from stand Pt Will Perform Lower Body Dressing: with set-up;sit to/from stand Pt Will Transfer to Toilet: with supervision Additional ADL Goal #1: Pt will verbalize 2 strateiges to reduce risk of falls  Plan Discharge plan needs to be updated    Co-evaluation                 AM-PAC OT "6 Clicks" Daily Activity     Outcome Measure   Help from another person eating meals?: A Little Help from another person taking care of personal grooming?: A Little Help from another person toileting, which includes using toliet, bedpan, or urinal?: A Little Help from another person bathing (including washing, rinsing, drying)?: A Little Help from another person to put on and taking off regular upper body clothing?: A Little Help from another person to put on and taking off regular lower body clothing?: A Little 6 Click Score: 18    End of Session Equipment Utilized During Treatment: Gait belt;Rolling walker (2 wheels)  OT Visit Diagnosis: Unsteadiness on feet (R26.81);Other abnormalities of gait and mobility (R26.89);Muscle weakness (generalized) (M62.81);Ataxia, unspecified (R27.0);Other symptoms and signs involving the nervous system (R29.898)   Activity Tolerance Patient tolerated treatment well   Patient Left in bed;with call bell/phone within reach;with bed alarm set   Nurse Communication Mobility status;Other (comment) (DC recommendations)        Time: 1914-7829 OT Time Calculation (min): 25 min  Charges: OT General Charges $OT Visit: 1 Visit OT Treatments $Self Care/Home Management : 23-37 mins  Luisa Dago, OT/L   Acute OT Clinical Specialist Acute Rehabilitation Services Pager [phone] Office [phone]   [facility] 03/10/2021, 2:22 PM

## 2021-03-10 NOTE — TOC Transition Note (Signed)
Transition of Care [facility]) - CM/SW Discharge Note   Patient Details  Name: OSINACHI ZUCCARELLO MRN: [id number] Date of Birth: 1990/09/26  [facility] of Care [facility] CM/SW Contact:  Pollie Friar, RN Phone Number: 03/10/2021, 2:28 PM   Clinical Narrative:    Patient is discharging home with outpatient therapy through [facility]. Orders in Epic and information on the AVS.  DME for home delivered to the room per [employer].  Pt has supervision at home and transportation to home.    Final next level of care: OP Rehab Barriers to Discharge: No Barriers Identified   Patient Goals and CMS Choice Patient states their goals for this hospitalization and ongoing recovery are:: to go home CMS Medicare.gov Compare Post Acute Care list provided to:: Patient Represenative (must comment) Choice offered to / list presented to : Parent  Discharge Placement                       Discharge Plan and Services   Discharge Planning Services: CM Consult            DME Arranged: 3-N-1, Walker rolling DME Agency: [employer] Date DME Agency Contacted: 03/10/21   Representative spoke with at DME Agency: [place] ([place]) Interventions     Readmission Risk Interventions No flowsheet data found.

## 2021-03-11 ENCOUNTER — Other Ambulatory Visit: Payer: Self-pay | Admitting: Family Medicine

## 2021-03-11 ENCOUNTER — Telehealth: Payer: Self-pay | Admitting: Family Medicine

## 2021-03-11 ENCOUNTER — Telehealth: Payer: Self-pay

## 2021-03-11 NOTE — Telephone Encounter (Signed)
Transition Care Management Follow-up Telephone Call Date of discharge and from where: 03/10/2021, [facility] How have you been since you were released from the hospital? He said he is feeling better, doing good. He understands his quarantine ends 03/13/2021.  Any questions or concerns? No  Items Reviewed: Did the pt receive and understand the discharge instructions provided? Yes  Medications obtained and verified? Yes  - he said he has all of his medications and did not have any questions about his med regime.  Other? No  Any new allergies since your discharge? No  Dietary orders reviewed? Yes Do you have support at home? Yes   Home Care and Equipment/Supplies: Were home health services ordered? no If so, what is the name of the agency? N/a  Has the agency set up a time to come to the patient's home? not applicable Were any new equipment or medical supplies ordered?  Yes: RW, 3:1 commode What is the name of the medical supply agency? [employer] Were you able to get the supplies/equipment? yes Do you have any questions related to the use of the equipment or supplies? No  Scheduled for outpatient neuro rehab PT/OT = 03/24/2021.   Functional Questionnaire: (I = Independent and D = Dependent) ADLs: independent with ADLs.  He is using the RW with ambulation.   Follow up appointments reviewed:  PCP Hospital f/u appt confirmed? No   - Dr Jillyn Hidden who is listed as his PCP is no longer at [facility]. The patient did not want to schedule an appointment to see another provider. Gave him the phone number for [facility] to call when he is ready to schedule an appointment.  Specialist Hospital f/u appt confirmed? Yes  Scheduled to see Neurology - 08/13/2021.  Are transportation arrangements needed? No  If their condition worsens, is the pt aware to call PCP or go to the Emergency Dept.? Yes Was the patient provided with contact information for the PCP's office or ED? Yes Was to pt encouraged to call back  with questions or concerns? Yes

## 2021-03-11 NOTE — Telephone Encounter (Signed)
Patient was admitted for breakthrough seizure 03/07/2021 in setting of missed levetiracetam (patient reports for a few days). He was seen in follow up with me 02/04/21 and started on amantadine. He requested cannabis card and dalfampridine when called about lab results 02/08/21. He reports taking amantadine and dalfampridine for "a few days" prior to seizure. He was discharged home on increased dose of levetiracetam. I have contacted Miguel Black, today, to confirm that he is taking levetiracetam 1000mg  BID as prescribed. He has new prescription and reports taking consistently. I will update [employer]. Seizure not likely related to infusions. Patient is aware not to take dalfampridine. Medication discontinued. He will hold amantadine until further notice. Seizure precautions advised. No driving. Will send to Dr for review.

## 2021-03-24 ENCOUNTER — Encounter: Payer: Self-pay | Admitting: Occupational Therapy

## 2021-03-24 ENCOUNTER — Other Ambulatory Visit: Payer: Self-pay

## 2021-03-24 ENCOUNTER — Ambulatory Visit: Payer: Medicare (Managed Care) | Attending: Internal Medicine | Admitting: Occupational Therapy

## 2021-03-24 ENCOUNTER — Ambulatory Visit: Payer: Medicare (Managed Care)

## 2021-03-24 DIAGNOSIS — M6281 Muscle weakness (generalized): Secondary | ICD-10-CM | POA: Diagnosis present

## 2021-03-24 DIAGNOSIS — R262 Difficulty in walking, not elsewhere classified: Secondary | ICD-10-CM | POA: Insufficient documentation

## 2021-03-24 DIAGNOSIS — R2681 Unsteadiness on feet: Secondary | ICD-10-CM | POA: Diagnosis present

## 2021-03-24 DIAGNOSIS — R2689 Other abnormalities of gait and mobility: Secondary | ICD-10-CM | POA: Diagnosis present

## 2021-03-24 DIAGNOSIS — R278 Other lack of coordination: Secondary | ICD-10-CM | POA: Insufficient documentation

## 2021-03-24 NOTE — Therapy (Signed)
[facility] [address] [place], [place], [address] Phone: [phone]   Fax:  [phone]  Physical Therapy Evaluation  Patient Details  Name: Miguel Black MRN: [id number] Date of Birth: 1991/01/10 Referring Provider (PT): Despina Arias, MD   Encounter Date: 03/24/2021   PT End of Session - 03/24/21 1447     Visit Number 1    Number of Visits 12    Date for PT Re-Evaluation 05/05/21    Authorization Type On chart:  [facility] Medicare/Medicaid    Progress Note Due on Visit 10    PT Start Time 1445    PT Stop Time 1530    PT Time Calculation (min) 45 min    Equipment Utilized During Treatment Gait belt    Activity Tolerance Patient tolerated treatment well;Other (comment)   multiple instances of LOB/fall during session requiring PT-assist to correct   Behavior During Therapy [facility] for tasks assessed/performed             Past Medical History:  Diagnosis Date   MS (multiple sclerosis) (HCC)    Seizure (HCC)    Status epilepticus (HCC) 07/12/2018    Past Surgical History:  Procedure Laterality Date   NO PAST SURGERIES      There were no vitals filed for this visit.    Subjective Assessment - 03/24/21 1449     Subjective Pt had significant seizure activity which resulted in hospitalization x 3 days and notices left side seems to be weaker since that time. He typically wears an AFO for LLE and cane for ambulation    Pertinent History dx of MS June 2015 (relapsing/remitting), seizure hx    Patient Stated Goals Pt's goals for therapy are to strengthen L side and get back up to speed    Currently in Pain? No/denies                [facility] PT Assessment - 03/24/21 1453       Assessment   Medical Diagnosis Gait disturbance, Left-sided weakness, status epilepticus    Referring Provider (PT) Despina Arias, MD    Onset Date/Surgical Date 03/07/21    Hand Dominance Left    Prior Therapy yes - OP PT       Precautions   Precautions Fall      Balance Screen   Has the patient fallen in the past 6 months No    Has the patient had a decrease in activity level because of a fear of falling?  No    Is the patient reluctant to leave their home because of a fear of falling?  No      Home Tourist information centre manager residence    Living Arrangements Parent    Home Access Stairs to enter    Entrance Stairs-Number of Steps 7    Entrance Stairs-Rails Right    Home Layout Two level;Bed/bath upstairs    Alternate Level Stairs-Number of Steps 13    Home Equipment [place] - single point      Prior Function   Level of Independence Requires assistive device for independence    Vocation On disability    Leisure music, being outside, hanging out with friends      Sensation   Light Touch Appears Intact    Proprioception Appears Intact      Coordination   Gross Motor Movements are Fluid and Coordinated No   LLE   Fine Motor Movements are Fluid  and Coordinated No   LUE   Coordination and Movement Description difficulty with rapid and alternating movements    Heel Shin Test LLE involved      Posture/Postural Control   Posture/Postural Control No significant limitations      Tone   Assessment Location Left Lower Extremity      ROM / Strength   AROM / PROM / Strength AROM;Strength      AROM   Overall AROM  Within functional limits for tasks performed    Overall AROM Comments BLE      Strength   Right Hip Flexion 5/5    Right Hip ABduction 4/5    Right Hip ADduction 4/5    Left Hip Flexion 3+/5    Left Hip ABduction 3-/5    Left Hip ADduction 3+/5    Right Knee Flexion 5/5    Right Knee Extension 5/5    Left Knee Flexion 4-/5    Left Knee Extension 4-/5    Right Ankle Dorsiflexion 5/5    Right Ankle Plantar Flexion 5/5    Left Ankle Dorsiflexion 3-/5    Left Ankle Plantar Flexion 3/5      Transfers   Transfers Sit to Stand;Stand to Sit    Sit to Stand 5: Supervision;With  upper extremity assist;From bed;Without upper extremity assist    Five time sit to stand comments  unable, requiring UE assist      Ambulation/Gait   Ambulation/Gait Yes    Ambulation/Gait Assistance 5: Supervision    Assistive device Straight cane    Gait Pattern Step-through pattern;Decreased step length - left;Decreased hip/knee flexion - left;Decreased dorsiflexion - left;Ataxic;Poor foot clearance - left;Left genu recurvatum    Gait velocity 20.96 sec = 1.56 ft/sec   without AFO     Berg Balance Test   Sit to Stand Able to stand without using hands and stabilize independently    Standing Unsupported Able to stand 2 minutes with supervision    Sitting with Back Unsupported but Feet Supported on Floor or Stool Able to sit safely and securely 2 minutes    Stand to Sit Controls descent by using hands    Transfers Able to transfer safely, definite need of hands    Standing Unsupported with Eyes Closed Able to stand 10 seconds with supervision    Standing Unsupported with Feet Together Able to place feet together independently and stand for 1 minute with supervision    From Standing, Reach Forward with Outstretched Arm Reaches forward but needs supervision    From Standing Position, Pick up Object from Floor Able to pick up shoe, needs supervision    From Standing Position, Turn to Look Behind Over each Shoulder Looks behind one side only/other side shows less weight shift    Turn 360 Degrees Needs close supervision or verbal cueing    Standing Unsupported, Alternately Place Feet on Step/Stool Able to complete >2 steps/needs minimal assist    Standing Unsupported, One Foot in [employer] balance while stepping or standing    Standing on One Leg Unable to try or needs assist to prevent fall    Total Score 32    Berg comment: high risk for falls      LLE Tone   LLE Tone Within Functional Limits                        Objective measurements completed on examination: See  above findings.  PT Education - 03/24/21 1541     Education Details discussion of assessment findings and rationale for PT intervention. Explanation of relevant research in regards to PT intervention and MS    Person(s) Educated Patient    Methods Explanation    Comprehension Verbalized understanding              PT Short Term Goals - 03/24/21 1549       PT SHORT TERM GOAL #1   Title Pt will be independent with HEP for improved strength, balance, transfers, and gait.    Time 3    Period Weeks    Status New    Target Date 04/14/21      PT SHORT TERM GOAL #2   Title Pt will improve 5x sit<>stand to less than or equal to 20 seconds to demonstrate improved functional strength and transfer efficiency.    Baseline unable to perform, requires UE support    Time 3    Period Weeks    Status New    Target Date 04/14/21      PT SHORT TERM GOAL #3   Title Demonstrate improved balance as evidenced by score 35/56 Berg Balance Test    Baseline 32/56    Time 3    Period Weeks    Status New    Target Date 04/14/21               PT Long Term Goals - 03/24/21 1550       PT LONG TERM GOAL #1   Title Pt will be independent with progression of HEP for improved strength, balance, transfers, and gait.    Time 6    Period Weeks    Status New    Target Date 05/05/21      PT LONG TERM GOAL #2   Title Demo restoration of gait speed to 2.2 ft/sec to improve safety and efficiency of gait    Baseline 1.56 ft/sec    Time 6    Period Weeks    Status New    Target Date 05/05/21      PT LONG TERM GOAL #3   Title Pt will improve Berg score to at least 37/56 to decrease fall risk.    Baseline 32/56    Time 6    Period Weeks    Status New    Target Date 05/05/21                    Plan - 03/24/21 1544     Clinical Impression Statement Pt is 31 yo male with hx of MS who experienced seizure event and now demonstrates high risk for falls per  Sharlene Motts Balance Test score 32/56 LLE weakness exacerbation, reduced gait speed, and decline in functional mobility, independence and safety.  PT services indicated to intervene and improve motor control/coordination, enhance reactive balance to reduce risk for falls, and facilitate return to PLOF and typical level of activity for community/household participation    Personal Factors and Comorbidities Comorbidity 2    Comorbidities MS June 2015 (relapsing/remitting), seizure hx    Examination-Activity Limitations Locomotion Level;Transfers;Stairs;Stand;Carry    Examination-Participation Restrictions Community Activity;Other   fitness/outdoor activities   Stability/Clinical Decision Making Evolving/Moderate complexity    Clinical Decision Making Moderate    Rehab Potential Good    PT Frequency 2x / week    PT Duration 6 weeks    PT Treatment/Interventions ADLs/Self Care Home Management;Gait training;Stair training;Functional mobility training;Therapeutic activities;Therapeutic exercise;Balance training;Neuromuscular re-education;DME  Instruction;Manual techniques;Orthotic Fit/Training;Patient/family education;Energy conservation;Electrical Stimulation    PT Next Visit Plan TUG test, develop HEP for floor exercises for LE strength/coordination    PT Home Exercise Plan walking with walker and AFO due to fall risk    Consulted and Agree with Plan of Care Patient             Patient will benefit from skilled therapeutic intervention in order to improve the following deficits and impairments:  Abnormal gait, Decreased coordination, Difficulty walking, Impaired tone, Decreased balance, Postural dysfunction, Decreased strength, Decreased mobility  Visit Diagnosis: Difficulty in walking, not elsewhere classified  Muscle weakness (generalized)     Problem List Patient Active Problem List   Diagnosis Date Noted   History of optic neuritis 07/29/2020   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 [place], [place], [address] Phone: [phone]   Fax:  [phone]  Name: Miguel Black MRN: [id number] Date of Birth: 1990-09-01

## 2021-03-24 NOTE — Therapy (Signed)
[facility] [address] [place], [place], [address] Phone: [phone]   Fax:  [phone]  Occupational Therapy Evaluation  Patient Details  Name: Miguel Black MRN: [id number] Date of Birth: 1990/09/08 Referring Provider (OT): Lonia Blood, MD   Encounter Date: 03/24/2021   OT End of Session - 03/24/21 1444     Visit Number 1    Number of Visits 7    Date for OT Re-Evaluation 05/05/21    Authorization Type [employer]    OT Start Time 1405    OT Stop Time 1446    OT Time Calculation (min) 41 min    Activity Tolerance Patient tolerated treatment well    Behavior During Therapy WFL for tasks assessed/performed             Past Medical History:  Diagnosis Date   MS (multiple sclerosis) (HCC)    Seizure (HCC)    Status epilepticus (HCC) 07/12/2018    Past Surgical History:  Procedure Laterality Date   NO PAST SURGERIES      There were no vitals filed for this visit.   Subjective Assessment - 03/24/21 1414     Subjective  Pt reports feeling weaker post recent seizure, reports L side is even more weak than previous.    Pertinent History MS, seizures, regular marijuana use    Patient Stated Goals to get stronger    Currently in Pain? No/denies    Pain Score 0-No pain               [facility] OT Assessment - 03/24/21 1416       Assessment   Medical Diagnosis Gait disturbance, Left-sided weakness, status epilepticus    Referring Provider (OT) Lonia Blood, MD    Onset Date/Surgical Date 03/07/21   seizure   Hand Dominance Left    Next MD Visit 06/24/2021    Prior Therapy yes - OP PT      Precautions   Precautions Fall   seizure     Restrictions   Weight Bearing Restrictions No      Balance Screen   Has the patient fallen in the past 6 months No    Has the patient had a decrease in activity level because of a fear of falling?  No    Is the patient reluctant to leave their  home because of a fear of falling?  No      Home  Environment   Family/patient expects to be discharged to: Private residence    Living Arrangements Parent   mom, dad, and brother   Available Help at Discharge Available 24 hours/day    Type of Home --   town house   Home Access Stairs   8-9 steps to enter with one rail   Home Layout Two level    Bathroom Network engineer Yes    Home Equipment [place] - 2 wheels;Cane -quad;Shower seat      Prior Function   Level of Independence Requires assistive device for independence   reports SPC in community, usually not in the home   Vocation On disability    Leisure music, being outside, hanging out with friends      ADL   ADL comments Pt reports Mod I with all self-care tasks of bathing and dressing.  Pt has shower chair in home tub/shower which he  reports utilizing most of the time.  Pt reports increased fatigue and decreased endurance to engage in ADLs and IADLs.      IADL   Shopping --   mom does   Light Housekeeping Performs light daily tasks such as dishwashing, bed making    Meal Prep Able to complete simple warm meal prep    Community Mobility Relies on family or friends for transportation    Medication Management Is responsible for taking medication in correct dosages at correct time    Landscape architect Manages day-to-day purchases, but needs help with banking, major purchases, etc.      Written Expression   Dominant Hand Left      Vision - History   Baseline Vision --   L eye blurry     Sensation   Light Touch Appears Intact      Coordination   Fine Motor Movements are Fluid and Coordinated No    Finger Nose Finger Test ataxia bilaterally, greater L >R    9 Hole Peg Test Right;Left    Right 9 Hole Peg Test 50.90    Left 9 Hole Peg Test 1:35.16    Box and Blocks R: 36 and L: 23      ROM / Strength   AROM / PROM / Strength Strength;AROM       AROM   Overall AROM  Within functional limits for tasks performed    Overall AROM Comments shoulder flexion grossly 150* bilaterally      Strength   Overall Strength Within functional limits for tasks performed    Strength Assessment Site Shoulder;Elbow    Right/Left Shoulder Right;Left    Right Shoulder Flexion 5/5    Left Shoulder Flexion 4/5    Right/Left Elbow Right;Left    Right Elbow Flexion 5/5    Right Elbow Extension 5/5    Left Elbow Flexion 4/5    Left Elbow Extension 4/5      Hand Function   Right Hand Grip (lbs) 94    Left Hand Grip (lbs) 90                              OT Education - 03/24/21 1503     Education Details Educated on OT purpose, POC, and goals.    Person(s) Educated Patient    Methods Explanation    Comprehension Verbalized understanding              OT Short Term Goals - 03/24/21 1510       OT SHORT TERM GOAL #1   Title Pt will verbalize understanding of energy conservation techniques    Time 3    Period Weeks    Status New    Target Date 04/14/21      OT SHORT TERM GOAL #2   Title Pt will be independent with HEP for improved coordination bilaterally.    Time 3    Period Weeks    Status New               OT Long Term Goals - 03/24/21 1512       OT LONG TERM GOAL #1   Title Pt will verbalize understanding of task modifications and/or potential AE needs to increase ease, safety, and independence w/ ADLs    Time 6    Period Weeks    Status New    Target Date 05/05/21      OT LONG  TERM GOAL #2   Title Pt will perform dynamic standing task for simple IADLs w/o LOB using DME and/or countertop support prn    Time 6    Period Weeks    Status New      OT LONG TERM GOAL #3   Title Pt will demonstrate improved UE functional use for ADLs as evidenced by increasing box/ blocks score by 5 blocks with LUE    Baseline R: 36 and L: 23    Time 6    Period Weeks    Status New      OT LONG TERM GOAL #4    Title Pt will demonstrate improved fine motor coordination for ADLs as evidenced by decreasing 9 hole peg test score for LUE by 10 secs    Time 6    Period Weeks    Status New                   Plan - 03/24/21 1503     Clinical Impression Statement Pt is a 31 y/o male who presents to OP OT due to increased weakness bilaterally L > R s/p seizures and hospitalization, decreased activity tolerance and endurance. Pt currently lives with parents in a townhouse with 8-9 steps to enter and is on disability prior to onset. PMHx includes MS, seizures, regular marijuana use. Pt will benefit from skilled occupational therapy services to address strength and coordination, balance, GM/FM control, safety awareness, introduction of compensatory strategies/AE prn, and implementation of an HEP to improve participation and safety during ADLs and leisure pursuits.    OT Occupational Profile and History Detailed Assessment- Review of Records and additional review of physical, cognitive, psychosocial history related to current functional performance    Occupational performance deficits (Please refer to evaluation for details): ADL's;Leisure;IADL's    Body Structure / Function / Physical Skills ADL;Balance;Coordination;Decreased knowledge of precautions;Decreased knowledge of use of DME;Endurance;FMC;GMC;IADL;Mobility;Pain;Strength;UE functional use    Rehab Potential Good    Clinical Decision Making Limited treatment options, no task modification necessary    Comorbidities Affecting Occupational Performance: May have comorbidities impacting occupational performance    Modification or Assistance to Complete Evaluation  No modification of tasks or assist necessary to complete eval    OT Frequency 1x / week    OT Duration 6 weeks    OT Treatment/Interventions Self-care/ADL training;Aquatic Therapy;Moist Heat;Cryotherapy;Ultrasound;Therapeutic exercise;Neuromuscular education;Energy conservation;DME and/or AE  instruction;Building services engineer;Therapeutic activities;Cognitive remediation/compensation;Patient/family education;Balance training    Plan Fine and gross motor control, MS education/energy conservation    Consulted and Agree with Plan of Care Patient             Patient will benefit from skilled therapeutic intervention in order to improve the following deficits and impairments:   Body Structure / Function / Physical Skills: ADL, Balance, Coordination, Decreased knowledge of precautions, Decreased knowledge of use of DME, Endurance, FMC, GMC, IADL, Mobility, Pain, Strength, UE functional use       Visit Diagnosis: Other lack of coordination  Unsteadiness on feet    Problem List Patient Active Problem List   Diagnosis Date Noted   History of optic neuritis 07/29/2020   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 [address], [address] [place], [place], [address] Phone: [phone]   Fax:  [phone]  Name: Miguel Black MRN: [id number] Date of Birth: 1991-01-10

## 2021-03-29 ENCOUNTER — Encounter: Payer: Self-pay | Admitting: Physical Therapy

## 2021-03-29 ENCOUNTER — Ambulatory Visit: Payer: Medicare (Managed Care) | Admitting: Physical Therapy

## 2021-03-29 ENCOUNTER — Other Ambulatory Visit: Payer: Self-pay

## 2021-03-29 DIAGNOSIS — R262 Difficulty in walking, not elsewhere classified: Secondary | ICD-10-CM

## 2021-03-29 DIAGNOSIS — R2681 Unsteadiness on feet: Secondary | ICD-10-CM

## 2021-03-29 DIAGNOSIS — R278 Other lack of coordination: Secondary | ICD-10-CM | POA: Diagnosis not present

## 2021-03-29 DIAGNOSIS — M6281 Muscle weakness (generalized): Secondary | ICD-10-CM

## 2021-03-29 DIAGNOSIS — R2689 Other abnormalities of gait and mobility: Secondary | ICD-10-CM

## 2021-03-29 NOTE — Therapy (Signed)
[place] ?[facility] ?[address], [address] ?[place], [place], [address] ?Phone: [phone]   Fax:  [phone] ? ?Physical Therapy Treatment ? ?Patient Details  ?Name: Miguel Black ?MRN: [id number] ?Date of Birth: 02/13/1990 ?Referring Provider (PT): Despina Arias, MD ? ? ?Encounter Date: 03/29/2021 ? ? PT End of Session - 03/29/21 1533   ? ? Visit Number 2   ? Number of Visits 12   ? Date for PT Re-Evaluation 05/05/21   ? Authorization Type On chart:  [facility] Medicare/Medicaid   ? Authorization Time Period RadMD Approved 12 PT Visits 03/24/2021-05/24/2021   ? Authorization - Visit Number 1   ? Authorization - Number of Visits 12   ? Progress Note Due on Visit 10   ? PT Start Time 1449   ? PT Stop Time 1529   ? PT Time Calculation (min) 40 min   ? Equipment Utilized During Treatment Gait belt   ? Activity Tolerance Patient tolerated treatment well   ? Behavior During Therapy [facility] for tasks assessed/performed   ? ?  ?  ? ?  ? ? ?Past Medical History:  ?Diagnosis Date  ? MS (multiple sclerosis) (HCC)   ? Seizure (HCC)   ? Status epilepticus (HCC) 07/12/2018  ? ? ?Past Surgical History:  ?Procedure Laterality Date  ? NO PAST SURGERIES    ? ? ?There were no vitals filed for this visit. ? ? Subjective Assessment - 03/29/21 1451   ? ? Subjective Has been wearing his foot brace around the house and wearing it now.   ? Pertinent History dx of MS June 2015 (relapsing/remitting), seizure hx   ? Patient Stated Goals Pt's goals for therapy are to strengthen L side and get back up to speed   ? Currently in Pain? No/denies   ? ?  ?  ? ?  ? ? ? ? ? [facility] PT Assessment - 03/29/21 0001   ? ?  ? Timed Up and Go Test  ? Normal TUG (seconds) 18.13   with SPC  ? ?  ?  ? ?  ? ? ? ? ? ? ? ? ? ? ? ? ? ? ? ? [facility] Adult PT Treatment/Exercise - 03/29/21 0001   ? ?  ? Neuro Re-ed   ? Neuro Re-ed Details  R/L wt shift at stair rail 10x, L wt shift + toe tap on 6" step witj 1 UE support   ?  ? Exercises  ?  Exercises Knee/Hip;Lumbar   ?  ? Lumbar Exercises: Aerobic  ? Nustep L5 x 6 min (UEs/LEs)   ?  ? Knee/Hip Exercises: Seated  ? Sit to Sand 1 set;10 reps;without UE support   cues to avoid L knee recurvatum and improved eccentric control  ?  ? Knee/Hip Exercises: Supine  ? Bridges Strengthening;1 set;10 reps   ? Bridges Limitations cues to avoid lumbar extension; hip and core instability evident   ? Bridges with Clamshell Strengthening;Both;1 set;10 reps   red TB  ? Other Supine Knee/Hip Exercises clam with red TB 10x   ? Other Supine Knee/Hip Exercises resisted march with red loop above knees 10x   ? ?  ?  ? ?  ? ? ? ? ? ? ? ? ? ? PT Education - 03/29/21 1533   ? ? Education Details HEP- Access Code: [email or web address]   ? Person(s) Educated Patient   ? Methods Explanation;Demonstration;Tactile cues;Verbal cues;Handout   ? Comprehension Verbalized understanding;Returned demonstration   ? ?  ?  ? ?  ? ? ?  PT Short Term Goals - 03/29/21 1540   ? ?  ? PT SHORT TERM GOAL #1  ? Title Pt will be independent with HEP for improved strength, balance, transfers, and gait.   ? Time 3   ? Period Weeks   ? Status On-going   ? Target Date 04/14/21   ?  ? PT SHORT TERM GOAL #2  ? Title Pt will improve 5x sit<>stand to less than or equal to 20 seconds to demonstrate improved functional strength and transfer efficiency.   ? Baseline unable to perform, requires UE support   ? Time 3   ? Period Weeks   ? Status On-going   ? Target Date 04/14/21   ?  ? PT SHORT TERM GOAL #3  ? Title Demonstrate improved balance as evidenced by score 35/56 Berg Balance Test   ? Baseline 32/56   ? Time 3   ? Period Weeks   ? Status On-going   ? Target Date 04/14/21   ? ?  ?  ? ?  ? ? ? ? PT Long Term Goals - 03/29/21 1540   ? ?  ? PT LONG TERM GOAL #1  ? Title Pt will be independent with progression of HEP for improved strength, balance, transfers, and gait.   ? Time 6   ? Period Weeks   ? Status On-going   ? Target Date 05/05/21   ?  ? PT LONG TERM GOAL #2  ?  Title Demo restoration of gait speed to 2.2 ft/sec to improve safety and efficiency of gait   ? Baseline 1.56 ft/sec   ? Time 6   ? Period Weeks   ? Status On-going   ? Target Date 05/05/21   ?  ? PT LONG TERM GOAL #3  ? Title Pt will improve Berg score to at least 37/56 to decrease fall risk.   ? Baseline 32/56   ? Time 6   ? Period Weeks   ? Status On-going   ? Target Date 05/05/21   ? ?  ?  ? ?  ? ? ? ? ? ? ? ? Plan - 03/29/21 1534   ? ? Clinical Impression Statement Patient arrived to session with report of wearing his L ground reaction AFO more consistently since last appointment. TUG testing revealed increased risk of falls. Educated patient on use AFO especially when ambulating with SPC, otherwise to use walker on days when more unsteady or if not wearing AFO. Patient reported understanding. Worked on STS transfers with focused on avoiding L knee recurvatum and improving eccentric control. Core strengthening activities revealed instability d/t weakness but with good effort to correct according to cues given. Standing balance activities focused on L weight shifting. Patient reported understanding of HEP update and without complaints at end of session.   ? Personal Factors and Comorbidities Comorbidity 2   ? Comorbidities MS June 2015 (relapsing/remitting), seizure hx   ? Examination-Activity Limitations Locomotion Level;Transfers;Stairs;Stand;Carry   ? Examination-Participation Restrictions Community Activity;Other   fitness/outdoor activities  ? Stability/Clinical Decision Making Evolving/Moderate complexity   ? Rehab Potential Good   ? PT Frequency 2x / week   ? PT Duration 6 weeks   ? PT Treatment/Interventions ADLs/Self Care Home Management;Gait training;Stair training;Functional mobility training;Therapeutic activities;Therapeutic exercise;Balance training;Neuromuscular re-education;DME Instruction;Manual techniques;Orthotic Fit/Training;Patient/family education;Energy conservation;Electrical Stimulation    ? PT Next Visit Plan reassess HEP, progress L LE strengthening and static/dynamic balance   ? PT Home Exercise Plan walking with walker and AFO  due to fall risk   ? Consulted and Agree with Plan of Care Patient   ? ?  ?  ? ?  ? ? ?Patient will benefit from skilled therapeutic intervention in order to improve the following deficits and impairments:  Abnormal gait, Decreased coordination, Difficulty walking, Impaired tone, Decreased balance, Postural dysfunction, Decreased strength, Decreased mobility ? ?Visit Diagnosis: ?Difficulty in walking, not elsewhere classified ? ?Muscle weakness (generalized) ? ?Unsteadiness on feet ? ?Other abnormalities of gait and mobility ? ? ? ? ?Problem List ?Patient Active Problem List  ? Diagnosis Date Noted  ? History of optic neuritis 07/29/2020  ? 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 High risk medication use 01/28/2020  ? Seizure disorder (HCC) 12/04/2019  ? Marijuana use 12/04/2019  ? Seizures (HCC) 01/22/2019  ? Gait disturbance 01/22/2019  ? Left-sided weakness 01/22/2019  ? Other fatigue 01/22/2019  ? Relapsing remitting multiple sclerosis (HCC) 07/12/2018  ? Encounter for screening for HIV 07/12/2018  ? ? ?Anette Guarneri, PT, DPT ?03/29/21 3:43 PM ? ? ?[place] ?[facility] ?[address], [address] ?[place], [place], [address] ?Phone: [phone]   Fax:  [phone] ? ?Name: Miguel Black ?MRN: [id number] ?Date of Birth: August 27, 1990 ? ? ? ?

## 2021-03-31 ENCOUNTER — Encounter: Payer: Self-pay | Admitting: Occupational Therapy

## 2021-03-31 ENCOUNTER — Ambulatory Visit: Payer: Medicare (Managed Care)

## 2021-03-31 ENCOUNTER — Ambulatory Visit: Payer: Medicare (Managed Care) | Admitting: Occupational Therapy

## 2021-03-31 ENCOUNTER — Other Ambulatory Visit: Payer: Self-pay

## 2021-03-31 DIAGNOSIS — R2681 Unsteadiness on feet: Secondary | ICD-10-CM

## 2021-03-31 DIAGNOSIS — R278 Other lack of coordination: Secondary | ICD-10-CM | POA: Diagnosis not present

## 2021-03-31 DIAGNOSIS — R2689 Other abnormalities of gait and mobility: Secondary | ICD-10-CM

## 2021-03-31 DIAGNOSIS — M6281 Muscle weakness (generalized): Secondary | ICD-10-CM

## 2021-03-31 NOTE — Therapy (Signed)
[place] ?[facility] ?[address], [address] ?[place], [place], [address] ?Phone: [phone]   Fax:  [phone] ? ?Physical Therapy Treatment ? ?Patient Details  ?Name: Miguel Black ?MRN: [id number] ?Date of Birth: 08-24-1990 ?Referring Provider (PT): Despina Arias, MD ? ? ?Encounter Date: 03/31/2021 ? ? PT End of Session - 03/31/21 1540   ? ? Visit Number 3   ? Number of Visits 12   ? Date for PT Re-Evaluation 05/05/21   ? Authorization Type On chart:  [facility] Medicare/Medicaid   ? Authorization Time Period RadMD Approved 12 PT Visits 03/24/2021-05/24/2021   ? Authorization - Visit Number 2   ? Authorization - Number of Visits 12   ? Progress Note Due on Visit 10   ? PT Start Time 1530   ? PT Stop Time 1615   ? PT Time Calculation (min) 45 min   ? Equipment Utilized During Treatment Gait belt   ? Activity Tolerance Patient tolerated treatment well   ? Behavior During Therapy [facility] for tasks assessed/performed   ? ?  ?  ? ?  ? ? ?Past Medical History:  ?Diagnosis Date  ? MS (multiple sclerosis) (HCC)   ? Seizure (HCC)   ? Status epilepticus (HCC) 07/12/2018  ? ? ?Past Surgical History:  ?Procedure Laterality Date  ? NO PAST SURGERIES    ? ? ?There were no vitals filed for this visit. ? ? Subjective Assessment - 03/31/21 1536   ? ? Subjective Feeling pretty good walking with brace and cane   ? Pertinent History dx of MS June 2015 (relapsing/remitting), seizure hx   ? Patient Stated Goals Pt's goals for therapy are to strengthen L side and get back up to speed   ? ?  ?  ? ?  ? ? ? ? ? ? ? ? ? ? ? ? ? ? ? ? ? ? ? ? [facility] Adult PT Treatment/Exercise - 03/31/21 0001   ? ?  ? Lumbar Exercises: Standing  ? Other Standing Lumbar Exercises standing on airex pad: trunk twists  4.4# med ball 2x10. Med ball body orbits 5x clockwise/counter. Med ball halos 10x to improve recuitment, core strength, coordination, proximal stability   ? Other Standing Lumbar Exercises standing march 2.5# 1x10 on  compliant, then 1x10 with overhead med ball hold on firm   ?  ? Lumbar Exercises: Seated  ? Long [employer] on Chair Strengthening;Both;2 sets;10 reps   ? LAQ on Chair Weights (lbs) 2.5   ? Hip Flexion on Ball Strengthening;Both;20 reps   ? Hip Flexion on Ball Limitations 2.5# on chair   ? Sit to Stand 20 reps   ? Sit to Stand Limitations from elevated chair with 4.4 lbs med ball to overhead press   ? Other Seated Lumbar Exercises hip add isometric 3x10   ? ?  ?  ? ?  ? ? ? ? ? ? ? ? ? ? ? ? PT Short Term Goals - 03/29/21 1540   ? ?  ? PT SHORT TERM GOAL #1  ? Title Pt will be independent with HEP for improved strength, balance, transfers, and gait.   ? Time 3   ? Period Weeks   ? Status On-going   ? Target Date 04/14/21   ?  ? PT SHORT TERM GOAL #2  ? Title Pt will improve 5x sit<>stand to less than or equal to 20 seconds to demonstrate improved functional strength and transfer efficiency.   ?  Baseline unable to perform, requires UE support   ? Time 3   ? Period Weeks   ? Status On-going   ? Target Date 04/14/21   ?  ? PT SHORT TERM GOAL #3  ? Title Demonstrate improved balance as evidenced by score 35/56 Berg Balance Test   ? Baseline 32/56   ? Time 3   ? Period Weeks   ? Status On-going   ? Target Date 04/14/21   ? ?  ?  ? ?  ? ? ? ? PT Long Term Goals - 03/29/21 1540   ? ?  ? PT LONG TERM GOAL #1  ? Title Pt will be independent with progression of HEP for improved strength, balance, transfers, and gait.   ? Time 6   ? Period Weeks   ? Status On-going   ? Target Date 05/05/21   ?  ? PT LONG TERM GOAL #2  ? Title Demo restoration of gait speed to 2.2 ft/sec to improve safety and efficiency of gait   ? Baseline 1.56 ft/sec   ? Time 6   ? Period Weeks   ? Status On-going   ? Target Date 05/05/21   ?  ? PT LONG TERM GOAL #3  ? Title Pt will improve Berg score to at least 37/56 to decrease fall risk.   ? Baseline 32/56   ? Time 6   ? Period Weeks   ? Status On-going   ? Target Date 05/05/21   ? ?  ?  ? ?   ? ? ? ? ? ? ? ? Plan - 03/31/21 1619   ? ? Clinical Impression Statement Tx focus on performing dynamic balance and strength activities mainly on compliant surfaces to facilitate muscle recuitment performing activities requiring postural perturbation and resistance outside COG for added challenge. Multiple LOB incured requiring therapist physical assistance to prevent LOB/fall. Pt notes increased fatigue LLE with use of weights requiring rest periods every 10-20 reps. On reflection, many of today's activities were too challenging given level of resistance and use of compliant surface and will return to performing unsupported activities either on firm surface and/or without addition of weight to LLE. Continued sessions indicated to progress LLE motor control, strength, and proximal stability to enhance balance and reduce risk for falls during gait   ? Personal Factors and Comorbidities Comorbidity 2   ? Comorbidities MS June 2015 (relapsing/remitting), seizure hx   ? Examination-Activity Limitations Locomotion Level;Transfers;Stairs;Stand;Carry   ? Examination-Participation Restrictions Community Activity;Other   fitness/outdoor activities  ? Stability/Clinical Decision Making Evolving/Moderate complexity   ? Rehab Potential Good   ? PT Frequency 2x / week   ? PT Duration 6 weeks   ? PT Treatment/Interventions ADLs/Self Care Home Management;Gait training;Stair training;Functional mobility training;Therapeutic activities;Therapeutic exercise;Balance training;Neuromuscular re-education;DME Instruction;Manual techniques;Orthotic Fit/Training;Patient/family education;Energy conservation;Electrical Stimulation   ? PT Next Visit Plan reassess HEP, progress L LE strengthening and static/dynamic balance   ? PT Home Exercise Plan walking with walker and AFO due to fall risk   ? Consulted and Agree with Plan of Care Patient   ? ?  ?  ? ?  ? ? ?Patient will benefit from skilled therapeutic intervention in order to improve the  following deficits and impairments:  Abnormal gait, Decreased coordination, Difficulty walking, Impaired tone, Decreased balance, Postural dysfunction, Decreased strength, Decreased mobility ? ?Visit Diagnosis: ?Muscle weakness (generalized) ? ?Unsteadiness on feet ? ?Other abnormalities of gait and mobility ? ? ? ? ?Problem List ?Patient Active  Problem List  ? Diagnosis Date Noted  ? History of optic neuritis 07/29/2020  ? 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 High risk medication use 01/28/2020  ? Seizure disorder (HCC) 12/04/2019  ? Marijuana use 12/04/2019  ? Seizures (HCC) 01/22/2019  ? Gait disturbance 01/22/2019  ? Left-sided weakness 01/22/2019  ? Other fatigue 01/22/2019  ? Relapsing remitting multiple sclerosis (HCC) 07/12/2018  ? Encounter for screening for HIV 07/12/2018  ? ? ?Dion Body, PT ?03/31/2021, 4:26 PM ? ?[place] ?[facility] ?[address], [address] ?[place], [place], [address] ?Phone: [phone]   Fax:  [phone] ? ?Name: Miguel Black ?MRN: [id number] ?Date of Birth: [birth year] ? ? ? ?

## 2021-03-31 NOTE — Therapy (Signed)
[place] ?[facility] ?[address], [address] ?[place], [place], [address] ?Phone: [phone]   Fax:  [phone] ? ?Occupational Therapy Treatment ? ?Patient Details  ?Name: Miguel Black ?MRN: [id number] ?Date of Birth: [birth year] ?Referring Provider (OT): Lonia Blood, MD ? ? ?Encounter Date: 03/31/2021 ? ? OT End of Session - 03/31/21 1453   ? ? Visit Number 2   ? Number of Visits 7   ? Date for OT Re-Evaluation 05/05/21   ? Authorization Type [employer] Medicare Advantage   ? OT Start Time 1448   ? OT Stop Time 1530   ? OT Time Calculation (min) 42 min   ? Activity Tolerance Patient tolerated treatment well   ? Behavior During Therapy [facility] for tasks assessed/performed   ? ?  ?  ? ?  ? ? ?Past Medical History:  ?Diagnosis Date  ? MS (multiple sclerosis) (HCC)   ? Seizure (HCC)   ? Status epilepticus (HCC) 07/12/2018  ? ? ?Past Surgical History:  ?Procedure Laterality Date  ? NO PAST SURGERIES    ? ? ?There were no vitals filed for this visit. ? ? Subjective Assessment - 03/31/21 1452   ? ? Subjective  Pt reports tired of wearing his leg brace, but does recognize that it does improve his mobility.   ? Pertinent History MS, seizures, regular marijuana use   ? Patient Stated Goals to get stronger   ? Currently in Pain? No/denies   ? ?  ?  ? ?  ? ? ?FMC: small peg board pattern replication with focus on picking up and manipulating small pegs with R hand.  Pt completing pattern with increased time and dropping 5/28 with L hand and 0/28 with R hand.  Pt continues to report mild increased weakness and decreased coordination in R hand from baseline, but much more in L hand. ? ?ADL: educated on [employer] and online resources.  Educated on emotional well-being and provided information from [employer].  Engaged in discussion of guided imagery and meditation as well as utilizing family as support system as appropriate.  Pt continues to voice increased "eye opening"  about lift after most recent seizure. ? ? ? ? ? ? ? ? ? ? ? ? ? ? ? ? ? ? ? ? ? ? OT Short Term Goals - 03/31/21 1508   ? ?  ? OT SHORT TERM GOAL #1  ? Title Pt will verbalize understanding of energy conservation techniques   ? Time 3   ? Period Weeks   ? Status On-going   ? Target Date 04/14/21   ?  ? OT SHORT TERM GOAL #2  ? Title Pt will be independent with HEP for improved coordination bilaterally.   ? Time 3   ? Period Weeks   ? Status On-going   ? ?  ?  ? ?  ? ? ? ? OT Long Term Goals - 03/31/21 1508   ? ?  ? OT LONG TERM GOAL #1  ? Title Pt will verbalize understanding of task modifications and/or potential AE needs to increase ease, safety, and independence w/ ADLs   ? Time 6   ? Period Weeks   ? Status On-going   ? Target Date 05/05/21   ?  ? OT LONG TERM GOAL #2  ? Title Pt will perform dynamic standing task for simple IADLs w/o LOB using DME and/or countertop support prn   ? Time  6   ? Period Weeks   ? Status On-going   ?  ? OT LONG TERM GOAL #3  ? Title Pt will demonstrate improved UE functional use for ADLs as evidenced by increasing box/ blocks score by 5 blocks with LUE   ? Baseline R: 36 and L: 23   ? Time 6   ? Period Weeks   ? Status On-going   ?  ? OT LONG TERM GOAL #4  ? Title Pt will demonstrate improved fine motor coordination for ADLs as evidenced by decreasing 9 hole peg test score for LUE by 10 secs   ? Time 6   ? Period Weeks   ? Status On-going   ? ?  ?  ? ?  ? ? ? ? ? ? ? ? Plan - 03/31/21 1453   ? ? Clinical Impression Statement Pt continues to report weakness and decreased coordination bilaterally s/p recent seizure. Pt engaging in [facility] tasks with focus on increased use of LUE.  Pt requiring increased time to complete [facility] tasks with increased dropping of items in L hand.  Pt receptive to education on emotional wellbeing in MS due to increased awareness of fragility of life s/p recent seizure.  Encouraged pt to use family and friends as support system as appropriate.   ? OT Occupational  Profile and History Detailed Assessment- Review of Records and additional review of physical, cognitive, psychosocial history related to current functional performance   ? Occupational performance deficits (Please refer to evaluation for details): ADL's;Leisure;IADL's   ? Body Structure / Function / Physical Skills ADL;Balance;Coordination;Decreased knowledge of precautions;Decreased knowledge of use of DME;Endurance;FMC;GMC;IADL;Mobility;Pain;Strength;UE functional use   ? Rehab Potential Good   ? Clinical Decision Making Limited treatment options, no task modification necessary   ? Comorbidities Affecting Occupational Performance: May have comorbidities impacting occupational performance   ? Modification or Assistance to Complete Evaluation  No modification of tasks or assist necessary to complete eval   ? OT Frequency 1x / week   ? OT Duration 6 weeks   ? OT Treatment/Interventions Self-care/ADL training;Aquatic Therapy;Moist Heat;Cryotherapy;Ultrasound;Therapeutic exercise;Neuromuscular education;Energy conservation;DME and/or AE instruction;Building services engineer;Therapeutic activities;Cognitive remediation/compensation;Patient/family education;Balance training   ? Plan Fine and gross motor control, MS education/energy conservation   ? Consulted and Agree with Plan of Care Patient   ? ?  ?  ? ?  ? ? ?Patient will benefit from skilled therapeutic intervention in order to improve the following deficits and impairments:   ?Body Structure / Function / Physical Skills: ADL, Balance, Coordination, Decreased knowledge of precautions, Decreased knowledge of use of DME, Endurance, FMC, GMC, IADL, Mobility, Pain, Strength, UE functional use ?  ?  ? ? ?Visit Diagnosis: ?Muscle weakness (generalized) ? ?Unsteadiness on feet ? ?Other abnormalities of gait and mobility ? ?Other lack of coordination ? ? ? ?Problem List ?Patient Active Problem List  ? Diagnosis Date Noted  ? History of optic neuritis 07/29/2020  ?  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 High risk medication use 01/28/2020  ? Seizure disorder (HCC) 12/04/2019  ? Marijuana use 12/04/2019  ? Seizures (HCC) 01/22/2019  ? Gait disturbance 01/22/2019  ? Left-sided weakness 01/22/2019  ? Other fatigue 01/22/2019  ? Relapsing remitting multiple sclerosis (HCC) 07/12/2018  ? Encounter for screening for HIV 07/12/2018  ? ? Rosalio Loud, OT ?03/31/2021, 4:33 PM ? ?[place] ?[facility] ?[address], [address] ?[place], [place], [address] ?Phone: [phone]   Fax:  [phone] ? ?  Name: Miguel Black ?MRN: [id number] ?Date of Birth: 1990-06-24 ? ?

## 2021-04-05 ENCOUNTER — Ambulatory Visit: Payer: Medicare (Managed Care)

## 2021-04-05 ENCOUNTER — Encounter: Payer: Self-pay | Admitting: Occupational Therapy

## 2021-04-05 ENCOUNTER — Ambulatory Visit: Payer: Medicare (Managed Care) | Admitting: Occupational Therapy

## 2021-04-05 ENCOUNTER — Other Ambulatory Visit: Payer: Self-pay

## 2021-04-05 DIAGNOSIS — R2681 Unsteadiness on feet: Secondary | ICD-10-CM

## 2021-04-05 DIAGNOSIS — M6281 Muscle weakness (generalized): Secondary | ICD-10-CM

## 2021-04-05 DIAGNOSIS — R278 Other lack of coordination: Secondary | ICD-10-CM

## 2021-04-05 NOTE — Therapy (Signed)
[place] ?[facility] ?[address], [address] ?[place], [place], [address] ?Phone: [phone]   Fax:  [phone] ? ?Physical Therapy Treatment ? ?Patient Details  ?Name: Miguel Black ?MRN: [id number] ?Date of Birth: 1990-08-10 ?Referring Provider (PT): Despina Arias, MD ? ? ?Encounter Date: 04/05/2021 ? ? PT End of Session - 04/05/21 1502   ? ? Visit Number 4   ? Number of Visits 12   ? Date for PT Re-Evaluation 05/05/21   ? Authorization Type On chart:  [facility] Medicare/Medicaid   ? Authorization Time Period RadMD Approved 12 PT Visits 03/24/2021-05/24/2021   ? Authorization - Visit Number 4   ? Authorization - Number of Visits 12   ? Progress Note Due on Visit 10   ? PT Start Time 1450   ? PT Stop Time 1535   ? PT Time Calculation (min) 45 min   ? Equipment Utilized During Treatment Gait belt   ? Activity Tolerance Patient tolerated treatment well   ? Behavior During Therapy [facility] for tasks assessed/performed   ? ?  ?  ? ?  ? ? ?Past Medical History:  ?Diagnosis Date  ? MS (multiple sclerosis) (HCC)   ? Seizure (HCC)   ? Status epilepticus (HCC) 07/12/2018  ? ? ?Past Surgical History:  ?Procedure Laterality Date  ? NO PAST SURGERIES    ? ? ?There were no vitals filed for this visit. ? ? Subjective Assessment - 04/05/21 1547   ? ? Subjective Some soreness after last PT session but nothing detrimental   ? Pertinent History dx of MS June 2015 (relapsing/remitting), seizure hx   ? Patient Stated Goals Pt's goals for therapy are to strengthen L side and get back up to speed   ? ?  ?  ? ?  ? ? ? ? ? ? ? ? ? ? ? ? ? ? ? ? ? ? ? ? [facility] Adult PT Treatment/Exercise - 04/05/21 0001   ? ?  ? Neuro Re-ed   ? Neuro Re-ed Details  Standing in corner 4.4# med ball: trunk twists 3x10 emphasis on visual tracking; mini-squat to overhead swing 2x10 to facilitate righting reaction and improve balance strategy for ant-post postural perturbations. Performing seated shadow boxing 3x30 sec between standing  activities to improve rapid, alternating UE movements and maintain elevated HR. Seated EOM "stick wrestling" activity 3x30 sec against therapist to improve trunk/grip strength and withstand moderate-strong postural perturbations   ?  ? Lumbar Exercises: Standing  ? Other Standing Lumbar Exercises standing facing corner, high knee march 2.5 lbs 2x10   ?  ? Lumbar Exercises: Seated  ? Long [employer] on Chair Strengthening;Both;2 sets;10 reps   ? LAQ on Chair Weights (lbs) 2.5   ? ?  ?  ? ?  ? ? ? ? ? ? ? ? ? ? ? ? PT Short Term Goals - 03/29/21 1540   ? ?  ? PT SHORT TERM GOAL #1  ? Title Pt will be independent with HEP for improved strength, balance, transfers, and gait.   ? Time 3   ? Period Weeks   ? Status On-going   ? Target Date 04/14/21   ?  ? PT SHORT TERM GOAL #2  ? Title Pt will improve 5x sit<>stand to less than or equal to 20 seconds to demonstrate improved functional strength and transfer efficiency.   ? Baseline unable to perform, requires UE support   ? Time 3   ?  Period Weeks   ? Status On-going   ? Target Date 04/14/21   ?  ? PT SHORT TERM GOAL #3  ? Title Demonstrate improved balance as evidenced by score 35/56 Berg Balance Test   ? Baseline 32/56   ? Time 3   ? Period Weeks   ? Status On-going   ? Target Date 04/14/21   ? ?  ?  ? ?  ? ? ? ? PT Long Term Goals - 03/29/21 1540   ? ?  ? PT LONG TERM GOAL #1  ? Title Pt will be independent with progression of HEP for improved strength, balance, transfers, and gait.   ? Time 6   ? Period Weeks   ? Status On-going   ? Target Date 05/05/21   ?  ? PT LONG TERM GOAL #2  ? Title Demo restoration of gait speed to 2.2 ft/sec to improve safety and efficiency of gait   ? Baseline 1.56 ft/sec   ? Time 6   ? Period Weeks   ? Status On-going   ? Target Date 05/05/21   ?  ? PT LONG TERM GOAL #3  ? Title Pt will improve Berg score to at least 37/56 to decrease fall risk.   ? Baseline 32/56   ? Time 6   ? Period Weeks   ? Status On-going   ? Target Date 05/05/21   ? ?   ?  ? ?  ? ? ? ? ? ? ? ? Plan - 04/05/21 1551   ? ? Clinical Impression Statement Improved balance performance this session maintaining BOS on firm surface vs compliant and tolerating standing balance activities to greater degree with activities peformed pt standing with back to corner of room for safety in event of retro LOB and to provide tactile cues for limits of stability. Tolerating session without adverse effects but demo quick onset of fatigue requiring seated rest periods after 1x10 reps of PRE with leg weights. Continued sessions indicated to develop HEP to progress for STG/LTG to improve strength, balance, and activity tolerance   ? Personal Factors and Comorbidities Comorbidity 2   ? Comorbidities MS June 2015 (relapsing/remitting), seizure hx   ? Examination-Activity Limitations Locomotion Level;Transfers;Stairs;Stand;Carry   ? Examination-Participation Restrictions Community Activity;Other   fitness/outdoor activities  ? Stability/Clinical Decision Making Evolving/Moderate complexity   ? Rehab Potential Good   ? PT Frequency 2x / week   ? PT Duration 6 weeks   ? PT Treatment/Interventions ADLs/Self Care Home Management;Gait training;Stair training;Functional mobility training;Therapeutic activities;Therapeutic exercise;Balance training;Neuromuscular re-education;DME Instruction;Manual techniques;Orthotic Fit/Training;Patient/family education;Energy conservation;Electrical Stimulation   ? PT Next Visit Plan Develop balance activities to perform in corner for HEP   ? PT Home Exercise Plan walking with walker and AFO due to fall risk   ? Consulted and Agree with Plan of Care Patient   ? ?  ?  ? ?  ? ? ?Patient will benefit from skilled therapeutic intervention in order to improve the following deficits and impairments:  Abnormal gait, Decreased coordination, Difficulty walking, Impaired tone, Decreased balance, Postural dysfunction, Decreased strength, Decreased mobility ? ?Visit Diagnosis: ?Muscle  weakness (generalized) ? ?Unsteadiness on feet ? ? ? ? ?Problem List ?Patient Active Problem List  ? Diagnosis Date Noted  ? History of optic neuritis 07/29/2020  ? 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 High risk medication use 01/28/2020  ? Seizure disorder (HCC) 12/04/2019  ? Marijuana use 12/04/2019  ? Seizures (HCC) 01/22/2019  ?  Gait disturbance 01/22/2019  ? Left-sided weakness 01/22/2019  ? Other fatigue 01/22/2019  ? Relapsing remitting multiple sclerosis (HCC) 07/12/2018  ? Encounter for screening for HIV 07/12/2018  ? ? ?Dion Body, PT ?04/05/2021, 3:58 PM ? ?[place] ?[facility] ?[address], [address] ?[place], [place], [address] ?Phone: [phone]   Fax:  [phone] ? ?Name: LADAMIEN RAMMEL ?MRN: [id number] ?Date of Birth: 01-08-1991 ? ? ? ?

## 2021-04-05 NOTE — Patient Instructions (Signed)
?  Coordination Activities ? ?Perform the following activities for 10-15 minutes 1 times per day with both hand(s). ? ?Rotate ball in fingertips (clockwise and counter-clockwise). ?Toss ball between hands. ?Toss ball in air and catch with the same hand. ?Flip cards 1 at a time as fast as you can. ?Pick up coins and place in container or coin bank. ?Pick up coins and stack. ?Pick up coins one at a time until you get 5-10 in your hand, then move coins from palm to fingertips to stack one at a time. ?Screw together nuts and bolts, then unfasten. ?

## 2021-04-05 NOTE — Therapy (Signed)
[place] ?[facility] ?[address], [address] ?[place], [place], [address] ?Phone: [phone]   Fax:  [phone] ? ?Occupational Therapy Treatment ? ?Patient Details  ?Name: Miguel Black ?MRN: [id number] ?Date of Birth: Jul 02, 1990 ?Referring Provider (OT): Lonia Blood, MD ? ? ?Encounter Date: 04/05/2021 ? ? OT End of Session - 04/05/21 1415   ? ? Visit Number 3   ? Number of Visits 7   ? Date for OT Re-Evaluation 05/05/21   ? Authorization Type [employer] Medicare [employer]vantage   ? OT Start Time 1410   ? OT Stop Time 1450   ? OT Time Calculation (min) 40 min   ? Activity Tolerance Patient tolerated treatment well   ? Behavior During Therapy [facility] for tasks assessed/performed   ? ?  ?  ? ?  ? ? ?Past Medical History:  ?Diagnosis Date  ? MS (multiple sclerosis) (HCC)   ? Seizure (HCC)   ? Status epilepticus (HCC) 07/12/2018  ? ? ?Past Surgical History:  ?Procedure Laterality Date  ? NO PAST SURGERIES    ? ? ?There were no vitals filed for this visit. ? ? Subjective Assessment - 04/05/21 1415   ? ? Subjective  Pt reports not feeling the same since he last seizure.   ? Pertinent History MS, seizures, regular marijuana use   ? Patient Stated Goals to get stronger   ? Currently in Pain? No/denies   ? ?  ?  ? ?  ? ? ?FMC/GMC in standing with focus on picking up and manipulating PVC pipe pieces to complete pattern.  Pt utilizing RUE with improved success from last session due to increased size of manipulatives this session. Min cues to push pieces together firmly to ensure stability.  Pt tolerated standing ~8 mins before requiring seated rest break due to reports of BLE fatigue.  Pt removing pieces with L hand, noticing mild intention tremor with grasp with LUE.  Increased challenge by placing container of PVC pieces on mat table.  Trunk rotation to L to pick up pieces from mat table with L hand.  Pt then placing pieces to replicate pattern with increased use of LUE, utilizing RUE when  requiring bimanual assist. ? ?Provided pt with [facility] HEP.  Pt demonstrating decreased coordination with ball toss and card flipping.  Pt frequently dropping ball, benefiting from cues to start small to increase success.  Pt able to demonstrate increased motor control when flipping cards with repetition and cues to attend to task.  Provided instruction on picking up small items, in hand manipulation with coins, and stacking of variety of flat objects.  Pt reporting understanding of modifications to increase and decrease challenge with varied sized materials.  Provided with handout - see pt instructions. ? ? ? ? ? ? ? ? ? ? ? ? ? ? ? ? ? ? ? ? ? ? OT Short Term Goals - 03/31/21 1508   ? ?  ? OT SHORT TERM GOAL #1  ? Title Pt will verbalize understanding of energy conservation techniques   ? Time 3   ? Period Weeks   ? Status On-going   ? Target Date 04/14/21   ?  ? OT SHORT TERM GOAL #2  ? Title Pt will be independent with HEP for improved coordination bilaterally.   ? Time 3   ? Period Weeks   ? Status On-going   ? ?  ?  ? ?  ? ? ? ? OT  Long Term Goals - 03/31/21 1508   ? ?  ? OT LONG TERM GOAL #1  ? Title Pt will verbalize understanding of task modifications and/or potential AE needs to increase ease, safety, and independence w/ ADLs   ? Time 6   ? Period Weeks   ? Status On-going   ? Target Date 05/05/21   ?  ? OT LONG TERM GOAL #2  ? Title Pt will perform dynamic standing task for simple IADLs w/o LOB using DME and/or countertop support prn   ? Time 6   ? Period Weeks   ? Status On-going   ?  ? OT LONG TERM GOAL #3  ? Title Pt will demonstrate improved UE functional use for ADLs as evidenced by increasing box/ blocks score by 5 blocks with LUE   ? Baseline R: 36 and L: 23   ? Time 6   ? Period Weeks   ? Status On-going   ?  ? OT LONG TERM GOAL #4  ? Title Pt will demonstrate improved fine motor coordination for ADLs as evidenced by decreasing 9 hole peg test score for LUE by 10 secs   ? Time 6   ? Period Weeks   ?  Status On-going   ? ?  ?  ? ?  ? ? ? ? ? ? ? ? Plan - 04/05/21 1418   ? ? Clinical Impression Statement Pt continues to report weakness and decreased coordination bilaterally s/p recent seizure. Pt engaging in [facility] tasks with focus on increased use of LUE.  Pt demontrating increased motor control with larger PVC pipe pieces this session, however still with intention tremor and decreased coordination when connecting pieces.  Pt demonstrating increased challenge with ball rotation in finger tips and ball toss, responsive to cues to start small and increase amplitude as able.   ? OT Occupational Profile and History Detailed Assessment- Review of Records and additional review of physical, cognitive, psychosocial history related to current functional performance   ? Occupational performance deficits (Please refer to evaluation for details): ADL's;Leisure;IADL's   ? Body Structure / Function / Physical Skills ADL;Balance;Coordination;Decreased knowledge of precautions;Decreased knowledge of use of DME;Endurance;FMC;GMC;IADL;Mobility;Pain;Strength;UE functional use   ? Rehab Potential Good   ? Clinical Decision Making Limited treatment options, no task modification necessary   ? Comorbidities Affecting Occupational Performance: May have comorbidities impacting occupational performance   ? Modification or Assistance to Complete Evaluation  No modification of tasks or assist necessary to complete eval   ? OT Frequency 1x / week   ? OT Duration 6 weeks   ? OT Treatment/Interventions Self-care/ADL training;Aquatic Therapy;Moist Heat;Cryotherapy;Ultrasound;Therapeutic exercise;Neuromuscular education;Energy conservation;DME and/or AE instruction;Building services engineer;Therapeutic activities;Cognitive remediation/compensation;Patient/family education;Balance training   ? Plan Fine and gross motor control, MS education/energy conservation   ? Consulted and Agree with Plan of Care Patient   ? ?  ?  ? ?  ? ? ?Patient will  benefit from skilled therapeutic intervention in order to improve the following deficits and impairments:   ?Body Structure / Function / Physical Skills: ADL, Balance, Coordination, Decreased knowledge of precautions, Decreased knowledge of use of DME, Endurance, FMC, GMC, IADL, Mobility, Pain, Strength, UE functional use ?  ?  ? ? ?Visit Diagnosis: ?Muscle weakness (generalized) ? ?Unsteadiness on feet ? ?Other lack of coordination ? ? ? ?Problem List ?Patient Active Problem List  ? Diagnosis Date Noted  ? History of optic neuritis 07/29/2020  ? 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  High risk medication use 01/28/2020  ? Seizure disorder (HCC) 12/04/2019  ? Marijuana use 12/04/2019  ? Seizures (HCC) 01/22/2019  ? Gait disturbance 01/22/2019  ? Left-sided weakness 01/22/2019  ? Other fatigue 01/22/2019  ? Relapsing remitting multiple sclerosis (HCC) 07/12/2018  ? Encounter for screening for HIV 07/12/2018  ? ? Rosalio Loud, OT ?04/05/2021, 3:53 PM ? ?[place] ?[facility] ?[address], [address] ?[place], [place], [address] ?Phone: [phone]   Fax:  [phone] ? ?Name: Miguel Black ?MRN: [id number] ?Date of Birth: November 12, 1990 ? ?

## 2021-04-07 ENCOUNTER — Other Ambulatory Visit: Payer: Self-pay

## 2021-04-07 ENCOUNTER — Ambulatory Visit: Payer: Medicare (Managed Care)

## 2021-04-07 DIAGNOSIS — R262 Difficulty in walking, not elsewhere classified: Secondary | ICD-10-CM

## 2021-04-07 DIAGNOSIS — R278 Other lack of coordination: Secondary | ICD-10-CM

## 2021-04-07 DIAGNOSIS — R2681 Unsteadiness on feet: Secondary | ICD-10-CM

## 2021-04-07 DIAGNOSIS — M6281 Muscle weakness (generalized): Secondary | ICD-10-CM

## 2021-04-07 DIAGNOSIS — R2689 Other abnormalities of gait and mobility: Secondary | ICD-10-CM

## 2021-04-07 NOTE — Therapy (Signed)
[place] ?[facility] ?[address], [address] ?[place], [place], [address] ?Phone: [phone]   Fax:  [phone] ? ?Physical Therapy Treatment ? ?Patient Details  ?Name: Miguel Black ?MRN: [id number] ?Date of Birth: 1990-07-05 ?Referring Provider (PT): Despina Arias, MD ? ? ?Encounter Date: 04/07/2021 ? ? PT End of Session - 04/07/21 1451   ? ? Visit Number 5   ? Number of Visits 12   ? Date for PT Re-Evaluation 05/05/21   ? Authorization Type On chart:  [facility] Medicare/Medicaid   ? Authorization Time Period RadMD Approved 12 PT Visits 03/24/2021-05/24/2021   ? Authorization - Visit Number 5   ? Authorization - Number of Visits 12   ? Progress Note Due on Visit 10   ? PT Start Time 1445   ? PT Stop Time 1530   ? PT Time Calculation (min) 45 min   ? Equipment Utilized During Treatment Gait belt   ? Activity Tolerance Patient tolerated treatment well   ? Behavior During Therapy [facility] for tasks assessed/performed   ? ?  ?  ? ?  ? ? ?Past Medical History:  ?Diagnosis Date  ? MS (multiple sclerosis) (HCC)   ? Seizure (HCC)   ? Status epilepticus (HCC) 07/12/2018  ? ? ?Past Surgical History:  ?Procedure Laterality Date  ? NO PAST SURGERIES    ? ? ?There were no vitals filed for this visit. ? ? Subjective Assessment - 04/07/21 1453   ? ? Subjective Feeling more exhausted today, not been the same since last seizure   ? Pertinent History dx of MS June 2015 (relapsing/remitting), seizure hx   ? Patient Stated Goals Pt's goals for therapy are to strengthen L side and get back up to speed   ? Currently in Pain? No/denies   ? Pain Score 0-No pain   ? ?  ?  ? ?  ? ? ? ? ? ? ? ? ? ? ? ? ? ? ? ? ? ? ? ? [facility] Adult PT Treatment/Exercise - 04/07/21 0001   ? ?  ? Neuro Re-ed   ? Neuro Re-ed Details  standing reactive balance/coordination: bounce-catch playground ball off wall 2x10; bounce-catch off floor tennis ball1x10, throw-catch 1x10 reps tennis ball. Kicking swiss ball 2x10 against wall for weight  shifting and facilitate righting reactions. "Mini-golf" using cane and playground ball hitting to target to promote trunk rotation 10x left/right. Rolling swiss ball up/down the wall with forward/backward walking to promote reciprocal motion and body position against perturbations   ?  ? Lumbar Exercises: Aerobic  ? Nustep level 5 x 7 min to improve activity tolerance   ? ?  ?  ? ?  ? ? ? ? ? ? ? ? ? ? ? ? PT Short Term Goals - 03/29/21 1540   ? ?  ? PT SHORT TERM GOAL #1  ? Title Pt will be independent with HEP for improved strength, balance, transfers, and gait.   ? Time 3   ? Period Weeks   ? Status On-going   ? Target Date 04/14/21   ?  ? PT SHORT TERM GOAL #2  ? Title Pt will improve 5x sit<>stand to less than or equal to 20 seconds to demonstrate improved functional strength and transfer efficiency.   ? Baseline unable to perform, requires UE support   ? Time 3   ? Period Weeks   ? Status On-going   ? Target Date 04/14/21   ?  ?  PT SHORT TERM GOAL #3  ? Title Demonstrate improved balance as evidenced by score 35/56 Berg Balance Test   ? Baseline 32/56   ? Time 3   ? Period Weeks   ? Status On-going   ? Target Date 04/14/21   ? ?  ?  ? ?  ? ? ? ? PT Long Term Goals - 03/29/21 1540   ? ?  ? PT LONG TERM GOAL #1  ? Title Pt will be independent with progression of HEP for improved strength, balance, transfers, and gait.   ? Time 6   ? Period Weeks   ? Status On-going   ? Target Date 05/05/21   ?  ? PT LONG TERM GOAL #2  ? Title Demo restoration of gait speed to 2.2 ft/sec to improve safety and efficiency of gait   ? Baseline 1.56 ft/sec   ? Time 6   ? Period Weeks   ? Status On-going   ? Target Date 05/05/21   ?  ? PT LONG TERM GOAL #3  ? Title Pt will improve Berg score to at least 37/56 to decrease fall risk.   ? Baseline 32/56   ? Time 6   ? Period Weeks   ? Status On-going   ? Target Date 05/05/21   ? ?  ?  ? ?  ? ? ? ? ? ? ? ? Plan - 04/07/21 1640   ? ? Clinical Impression Statement Delayed righting reactions  especially when perturbation/bias placed to left weak side with pt demo decrease in kinesthetic/proprioceptive awareness and delayed hip strategy and absent stepping strategy requiring physical assist to correct LOB. Focus of session today being reactive/anticipatory balance and coordination activities to improve movement strategies to reduce risk for falls and enable greater safety with reaching outside BOS and withstanding postural perturbations. Demo fair to fair+ dynamic standing balance and would benefit from continued sessions to progress strength, balance, and activity tolerance to reduce risk for falls   ? Personal Factors and Comorbidities Comorbidity 2   ? Comorbidities MS June 2015 (relapsing/remitting), seizure hx   ? Examination-Activity Limitations Locomotion Level;Transfers;Stairs;Stand;Carry   ? Examination-Participation Restrictions Community Activity;Other   fitness/outdoor activities  ? Stability/Clinical Decision Making Evolving/Moderate complexity   ? Rehab Potential Good   ? PT Frequency 2x / week   ? PT Duration 6 weeks   ? PT Treatment/Interventions ADLs/Self Care Home Management;Gait training;Stair training;Functional mobility training;Therapeutic activities;Therapeutic exercise;Balance training;Neuromuscular re-education;DME Instruction;Manual techniques;Orthotic Fit/Training;Patient/family education;Energy conservation;Electrical Stimulation   ? PT Next Visit Plan Develop balance activities to perform in corner for HEP   ? PT Home Exercise Plan walking with walker and AFO due to fall risk   ? Consulted and Agree with Plan of Care Patient   ? ?  ?  ? ?  ? ? ?Patient will benefit from skilled therapeutic intervention in order to improve the following deficits and impairments:  Abnormal gait, Decreased coordination, Difficulty walking, Impaired tone, Decreased balance, Postural dysfunction, Decreased strength, Decreased mobility ? ?Visit Diagnosis: ?Muscle weakness  (generalized) ? ?Unsteadiness on feet ? ?Other lack of coordination ? ?Other abnormalities of gait and mobility ? ?Difficulty in walking, not elsewhere classified ? ? ? ? ?Problem List ?Patient Active Problem List  ? Diagnosis Date Noted  ? History of optic neuritis 07/29/2020  ? 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 High risk medication use 01/28/2020  ? Seizure disorder (HCC) 12/04/2019  ? Marijuana use 12/04/2019  ? Seizures (HCC) 01/22/2019  ?  Gait disturbance 01/22/2019  ? Left-sided weakness 01/22/2019  ? Other fatigue 01/22/2019  ? Relapsing remitting multiple sclerosis (HCC) 07/12/2018  ? Encounter for screening for HIV 07/12/2018  ? ? ?Dion Body, PT ?04/07/2021, 4:46 PM ? ?[place] ?[facility] ?[address], [address] ?[place], [place], [address] ?Phone: [phone]   Fax:  [phone] ? ?Name: WHITTAKER LENIS ?MRN: [id number] ?Date of Birth: 1990-05-03 ? ? ? ?

## 2021-04-12 ENCOUNTER — Other Ambulatory Visit: Payer: Self-pay

## 2021-04-12 ENCOUNTER — Ambulatory Visit: Payer: Medicare (Managed Care) | Admitting: Occupational Therapy

## 2021-04-12 ENCOUNTER — Encounter: Payer: Self-pay | Admitting: Physical Therapy

## 2021-04-12 ENCOUNTER — Encounter: Payer: Self-pay | Admitting: Occupational Therapy

## 2021-04-12 ENCOUNTER — Ambulatory Visit: Payer: Medicare (Managed Care) | Admitting: Physical Therapy

## 2021-04-12 ENCOUNTER — Other Ambulatory Visit: Payer: Self-pay | Admitting: Neurology

## 2021-04-12 DIAGNOSIS — R2681 Unsteadiness on feet: Secondary | ICD-10-CM

## 2021-04-12 DIAGNOSIS — M6281 Muscle weakness (generalized): Secondary | ICD-10-CM

## 2021-04-12 DIAGNOSIS — R278 Other lack of coordination: Secondary | ICD-10-CM | POA: Diagnosis not present

## 2021-04-12 DIAGNOSIS — R2689 Other abnormalities of gait and mobility: Secondary | ICD-10-CM

## 2021-04-12 DIAGNOSIS — G35 Multiple sclerosis: Secondary | ICD-10-CM

## 2021-04-12 NOTE — Therapy (Signed)
[place] ?[facility] ?[address], [address] ?[place], [place], [address] ?Phone: [phone]   Fax:  [phone] ? ?Occupational Therapy Treatment ? ?Patient Details  ?Name: Miguel Black ?MRN: [id number] ?Date of Birth: Oct 25, 1990 ?Referring Provider (OT): Lonia Blood, MD ? ? ?Encounter Date: 04/12/2021 ? ? OT End of Session - 04/12/21 1407   ? ? Visit Number 4   ? Number of Visits 7   ? Date for OT Re-Evaluation 05/05/21   ? Authorization Type [employer]   ? OT Start Time 1404   ? OT Stop Time 1447   ? OT Time Calculation (min) 43 min   ? Activity Tolerance Patient tolerated treatment well   ? Behavior During Therapy [facility] for tasks assessed/performed   ? ?  ?  ? ?  ? ? ?Past Medical History:  ?Diagnosis Date  ? MS (multiple sclerosis) (HCC)   ? Seizure (HCC)   ? Status epilepticus (HCC) 07/12/2018  ? ? ?Past Surgical History:  ?Procedure Laterality Date  ? NO PAST SURGERIES    ? ? ?There were no vitals filed for this visit. ? ? Subjective Assessment - 04/12/21 1406   ? ? Subjective  "I have been eating a bunch of cookies today"   ? Pertinent History MS, seizures, regular marijuana use   ? Patient Stated Goals to get stronger   ? Currently in Pain? No/denies   ? ?  ?  ? ?  ? ? ?ADL: Discussed functional tasks and resulting impairments secondary to decreased coordination in L hand.  Pt reports holding items in L hand and opening with R hand due to decreased coordination in LUE and ability to complete all self-care tasks independently including grooming tasks and buttoning buttons on shirt.  Pt reports just "feeling a difference" in his L hand coordination, but does not notice a functional difference.   ? ?Reiterated education on energy conservation strategies.  Pt reports feeling that he has more energy throughout the day and is not needing to go to bed so early, therefore "I must be using those strategies".  Provided pt with 4 P's of energy conservation handout  and discussed functional carryover of each example.  Pt able to identify 2 examples after discussion. ? ?FMC: Grooved pegboard with LUE.  Pt able to place grooved pegs with increased time, dropping 1 of 10.  Therapist increased challenge to attempt removing pegs with use of tweezers, pt unable to complete due to difficulty grading pressure on tweezers and decreased coordination when able to successfully squeeze.   ? ?GMC: Engaged in functional reach with trunk rotation to pick up rings on L side at low range and place on table top with LUE.  Pt demonstrating mild decreased GMC when reaching to place rings on cone at table top.  Therapist providing CGA for dynamic balance, especially when rotating to place rings back on table in low range.   ? ? ? ? [facility] OT Assessment - 04/12/21 0001   ? ?  ? Coordination  ? Left 9 Hole Peg Test 1:38.63   1:35.16  ? Box and Blocks L: 26   R: 36 and L: 23 on eval  ? ?  ?  ? ?  ? ? ? ? ? ? ? ? ? ? ? ? ? ? ? ? ? ? ? ? ? OT Short Term Goals - 04/12/21 1409   ? ?  ? OT SHORT TERM GOAL #1  ?  Title Pt will verbalize understanding of energy conservation techniques   ? Time 3   ? Period Weeks   ? Status Achieved   ? Target Date 04/14/21   ?  ? OT SHORT TERM GOAL #2  ? Title Pt will be independent with HEP for improved coordination bilaterally.   ? Time 3   ? Period Weeks   ? Status Achieved   ? ?  ?  ? ?  ? ? ? ? OT Long Term Goals - 04/12/21 1409   ? ?  ? OT LONG TERM GOAL #1  ? Title Pt will verbalize understanding of task modifications and/or potential AE needs to increase ease, safety, and independence w/ ADLs   ? Time 6   ? Period Weeks   ? Status On-going   ? Target Date 05/05/21   ?  ? OT LONG TERM GOAL #2  ? Title Pt will perform dynamic standing task for simple IADLs w/o LOB using DME and/or countertop support prn   ? Time 6   ? Period Weeks   ? Status On-going   ?  ? OT LONG TERM GOAL #3  ? Title Pt will demonstrate improved UE functional use for ADLs as evidenced by increasing box/  blocks score by 5 blocks with LUE   ? Baseline R: 36 and L: 23   L: 26 on 04/12/21  ? Time 6   ? Period Weeks   ? Status On-going   ?  ? OT LONG TERM GOAL #4  ? Title Pt will demonstrate improved fine motor coordination for ADLs as evidenced by decreasing 9 hole peg test score for LUE by 10 secs   ? Baseline 1:35.16   1:38.63 on 04/12/21  ? Time 6   ? Period Weeks   ? Status On-going   ? ?  ?  ? ?  ? ? ? ? ? ? ? ? Plan - 04/12/21 1407   ? ? Clinical Impression Statement Pt continues to report weakness and decreased coordination bilaterally s/p recent seizure. Pt reports engaging in some [facility] tasks at home, but recognizes that he could and should be doing more.  Pt continues to demonstrate mild decreased coordination in LUE with intention tremor when attempting to place items over cone due to requiring increased precision.  Pt is able to recognize energy conservation strategies and reports use at home intermittently as he feels like he is not as tired recently.   ? OT Occupational Profile and History Detailed Assessment- Review of Records and additional review of physical, cognitive, psychosocial history related to current functional performance   ? Occupational performance deficits (Please refer to evaluation for details): ADL's;Leisure;IADL's   ? Body Structure / Function / Physical Skills ADL;Balance;Coordination;Decreased knowledge of precautions;Decreased knowledge of use of DME;Endurance;FMC;GMC;IADL;Mobility;Pain;Strength;UE functional use   ? Rehab Potential Good   ? Clinical Decision Making Limited treatment options, no task modification necessary   ? Comorbidities Affecting Occupational Performance: May have comorbidities impacting occupational performance   ? Modification or Assistance to Complete Evaluation  No modification of tasks or assist necessary to complete eval   ? OT Frequency 1x / week   ? OT Duration 6 weeks   ? OT Treatment/Interventions Self-care/ADL training;Aquatic Therapy;Moist  Heat;Cryotherapy;Ultrasound;Therapeutic exercise;Neuromuscular education;Energy conservation;DME and/or AE instruction;Building services engineer;Therapeutic activities;Cognitive remediation/compensation;Patient/family education;Balance training   ? Plan Fine and gross motor control, functional mobility with coordination, MS education/energy conservation   ? Consulted and Agree with Plan of Care Patient   ? ?  ?  ? ?  ? ? ?  Patient will benefit from skilled therapeutic intervention in order to improve the following deficits and impairments:   ?Body Structure / Function / Physical Skills: ADL, Balance, Coordination, Decreased knowledge of precautions, Decreased knowledge of use of DME, Endurance, FMC, GMC, IADL, Mobility, Pain, Strength, UE functional use ?  ?  ? ? ?Visit Diagnosis: ?Muscle weakness (generalized) ? ?Unsteadiness on feet ? ?Other lack of coordination ? ?Other abnormalities of gait and mobility ? ? ? ?Problem List ?Patient Active Problem List  ? Diagnosis Date Noted  ? History of optic neuritis 07/29/2020  ? 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 High risk medication use 01/28/2020  ? Seizure disorder (HCC) 12/04/2019  ? Marijuana use 12/04/2019  ? Seizures (HCC) 01/22/2019  ? Gait disturbance 01/22/2019  ? Left-sided weakness 01/22/2019  ? Other fatigue 01/22/2019  ? Relapsing remitting multiple sclerosis (HCC) 07/12/2018  ? Encounter for screening for HIV 07/12/2018  ? ? Rosalio Loud, OT ?04/12/2021, 4:48 PM ? ?[place] ?[facility] ?[address], [address] ?[place], [place], [address] ?Phone: [phone]   Fax:  [phone] ? ?Name: Miguel Black ?MRN: [id number] ?Date of Birth: 1990-06-13 ? ?

## 2021-04-12 NOTE — Therapy (Signed)
[place] ?[facility] ?[address], [address] ?[place], [place], [address] ?Phone: [phone]   Fax:  [phone] ? ?Physical Therapy Treatment ? ?Patient Details  ?Name: Miguel Black ?MRN: [id number] ?Date of Birth: 18-May-1990 ?Referring Provider (PT): Despina Arias, MD ? ? ?Encounter Date: 04/12/2021 ? ? PT End of Session - 04/12/21 1603   ? ? Visit Number 6   ? Number of Visits 12   ? Date for PT Re-Evaluation 05/05/21   ? Authorization Type [employer] Medicare/Medicaid (check visit diagnosis list)   ? Authorization Time Period RadMD Approved 12 PT Visits 03/24/2021-05/24/2021   ? Authorization - Visit Number 6   ? Authorization - Number of Visits 12   ? Progress Note Due on Visit 10   ? PT Start Time 1447   ? PT Stop Time 1528   ? PT Time Calculation (min) 41 min   ? Equipment Utilized During Treatment Gait belt   ? Activity Tolerance Patient tolerated treatment well   ? Behavior During Therapy [facility] for tasks assessed/performed   ? ?  ?  ? ?  ? ? ?Past Medical History:  ?Diagnosis Date  ? MS (multiple sclerosis) (HCC)   ? Seizure (HCC)   ? Status epilepticus (HCC) 07/12/2018  ? ? ?Past Surgical History:  ?Procedure Laterality Date  ? NO PAST SURGERIES    ? ? ?There were no vitals filed for this visit. ? ? Subjective Assessment - 04/12/21 1452   ? ? Subjective Has been able to work on his exercises sometimes.   ? Pertinent History dx of MS June 2015 (relapsing/remitting), seizure hx   ? Patient Stated Goals Pt's goals for therapy are to strengthen L side and get back up to speed   ? Currently in Pain? No/denies   ? ?  ?  ? ?  ? ? ? ? ? ? ? ? ? ? ? ? ? ? ? ? ? ? ? ? [facility] Adult PT Treatment/Exercise - 04/12/21 0001   ? ?  ? Neuro Re-ed   ? Neuro Re-ed Details  ant/pos and R/L wt shifts on foam in II bars 10x each, anterior/posterior weight shifts on rockerboard and static balance 2x30", standing wide on foam with green medball pass behind back (unable to perform in romberg)   ?  ?  Lumbar Exercises: Aerobic  ? Nustep level 5 x 7 min to improve activity tolerance, cues to maintain at ~80SPM   ?  ? Knee/Hip Exercises: Standing  ? Terminal Knee Extension Strengthening;Left;1 set;10 reps;Theraband   ? Theraband Level (Terminal Knee Extension) Level 3 (Green)   ? Terminal Knee Extension Limitations cues to avoid L recurvatum   ? ?  ?  ? ?  ? ? ? ? ? ? ? ? ? ? PT Education - 04/12/21 1602   ? ? Education Details update to HEP- Access Code: [email or web address]; encouraged use of AFO at all times when up   ? Person(s) Educated Patient   ? Methods Explanation;Demonstration;Tactile cues;Verbal cues;Handout   ? Comprehension Verbalized understanding;Returned demonstration   ? ?  ?  ? ?  ? ? ? PT Short Term Goals - 03/29/21 1540   ? ?  ? PT SHORT TERM GOAL #1  ? Title Pt will be independent with HEP for improved strength, balance, transfers, and gait.   ? Time 3   ? Period Weeks   ? Status On-going   ? Target Date 04/14/21   ?  ?  PT SHORT TERM GOAL #2  ? Title Pt will improve 5x sit<>stand to less than or equal to 20 seconds to demonstrate improved functional strength and transfer efficiency.   ? Baseline unable to perform, requires UE support   ? Time 3   ? Period Weeks   ? Status On-going   ? Target Date 04/14/21   ?  ? PT SHORT TERM GOAL #3  ? Title Demonstrate improved balance as evidenced by score 35/56 Berg Balance Test   ? Baseline 32/56   ? Time 3   ? Period Weeks   ? Status On-going   ? Target Date 04/14/21   ? ?  ?  ? ?  ? ? ? ? PT Long Term Goals - 03/29/21 1540   ? ?  ? PT LONG TERM GOAL #1  ? Title Pt will be independent with progression of HEP for improved strength, balance, transfers, and gait.   ? Time 6   ? Period Weeks   ? Status On-going   ? Target Date 05/05/21   ?  ? PT LONG TERM GOAL #2  ? Title Demo restoration of gait speed to 2.2 ft/sec to improve safety and efficiency of gait   ? Baseline 1.56 ft/sec   ? Time 6   ? Period Weeks   ? Status On-going   ? Target Date 05/05/21   ?  ? PT LONG  TERM GOAL #3  ? Title Pt will improve Berg score to at least 37/56 to decrease fall risk.   ? Baseline 32/56   ? Time 6   ? Period Weeks   ? Status On-going   ? Target Date 05/05/21   ? ?  ?  ? ?  ? ? ? ? ? ? ? ? Plan - 04/12/21 1604   ? ? Clinical Impression Statement Patient arrived to session without new complaints. Encouraged patient to wear AFO daily to assist with L knee and ankle stability as patient not wearing it today- patient reported understanding. Worked on dynamic balance on foam and rockerboard. Used frequent tactile cues to maintain L soft knee and provided cues to shift weight anteriorly to correct posterior LOB. Updated HEP with quad strengthening activities and balance activities on foam that were well-tolerated today. Patient reported understanding and without complaints at end of session.   ? Personal Factors and Comorbidities Comorbidity 2   ? Comorbidities MS June 2015 (relapsing/remitting), seizure hx   ? Examination-Activity Limitations Locomotion Level;Transfers;Stairs;Stand;Carry   ? Examination-Participation Restrictions Community Activity;Other   fitness/outdoor activities  ? Stability/Clinical Decision Making Evolving/Moderate complexity   ? Rehab Potential Good   ? PT Frequency 2x / week   ? PT Duration 6 weeks   ? PT Treatment/Interventions ADLs/Self Care Home Management;Gait training;Stair training;Functional mobility training;Therapeutic activities;Therapeutic exercise;Balance training;Neuromuscular re-education;DME Instruction;Manual techniques;Orthotic Fit/Training;Patient/family education;Energy conservation;Electrical Stimulation   ? PT Next Visit Plan Develop balance activities to perform in corner for HEP, continue quad strengthening and balance activities   ? Consulted and Agree with Plan of Care Patient   ? ?  ?  ? ?  ? ? ?Patient will benefit from skilled therapeutic intervention in order to improve the following deficits and impairments:  Abnormal gait, Decreased  coordination, Difficulty walking, Impaired tone, Decreased balance, Postural dysfunction, Decreased strength, Decreased mobility ? ?Visit Diagnosis: ?Muscle weakness (generalized) ? ?Unsteadiness on feet ? ? ? ? ?Problem List ?Patient Active Problem List  ? Diagnosis Date Noted  ? History of optic neuritis 07/29/2020  ?  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 High risk medication use 01/28/2020  ? Seizure disorder (HCC) 12/04/2019  ? Marijuana use 12/04/2019  ? Seizures (HCC) 01/22/2019  ? Gait disturbance 01/22/2019  ? Left-sided weakness 01/22/2019  ? Other fatigue 01/22/2019  ? Relapsing remitting multiple sclerosis (HCC) 07/12/2018  ? Encounter for screening for HIV 07/12/2018  ? ? ?Anette Guarneri, PT, DPT ?04/12/21 4:06 PM ? ? ?[place] ?[facility] ?[address], [address] ?[place], [place], [address] ?Phone: [phone]   Fax:  [phone] ? ?Name: ROHITH FAUTH ?MRN: [id number] ?Date of Birth: 12-Sep-1990 ? ? ? ?

## 2021-04-14 ENCOUNTER — Other Ambulatory Visit: Payer: Self-pay

## 2021-04-14 ENCOUNTER — Ambulatory Visit: Payer: Medicare (Managed Care) | Admitting: Rehabilitative and Restorative Service Providers"

## 2021-04-14 ENCOUNTER — Encounter: Payer: Self-pay | Admitting: Rehabilitative and Restorative Service Providers"

## 2021-04-14 DIAGNOSIS — R2681 Unsteadiness on feet: Secondary | ICD-10-CM

## 2021-04-14 DIAGNOSIS — R278 Other lack of coordination: Secondary | ICD-10-CM

## 2021-04-14 DIAGNOSIS — R262 Difficulty in walking, not elsewhere classified: Secondary | ICD-10-CM

## 2021-04-14 DIAGNOSIS — M6281 Muscle weakness (generalized): Secondary | ICD-10-CM

## 2021-04-14 DIAGNOSIS — R2689 Other abnormalities of gait and mobility: Secondary | ICD-10-CM

## 2021-04-14 NOTE — Therapy (Signed)
[place] ?[facility] ?[address], [address] ?[place], [place], [address] ?Phone: [phone]   Fax:  [phone] ? ?Physical Therapy Treatment ? ?Patient Details  ?Name: Miguel Black ?MRN: [id number] ?Date of Birth: 09-Sep-1990 ?Referring Provider (PT): Despina Arias, MD ? ? ?Encounter Date: 04/14/2021 ? ? PT End of Session - 04/14/21 1111   ? ? Visit Number 7   ? Number of Visits 12   ? Date for PT Re-Evaluation 05/05/21   ? Authorization Type [employer] Medicare/Medicaid (check visit diagnosis list)   ? Authorization Time Period RadMD Approved 12 PT Visits 03/24/2021-05/24/2021   ? Authorization - Visit Number 7   ? Authorization - Number of Visits 12   ? Progress Note Due on Visit 10   ? PT Start Time 1106   ? PT Stop Time 1146   ? PT Time Calculation (min) 40 min   ? Equipment Utilized During Treatment Gait belt   ? Activity Tolerance Patient tolerated treatment well   ? Behavior During Therapy [facility] for tasks assessed/performed   ? ?  ?  ? ?  ? ? ?Past Medical History:  ?Diagnosis Date  ? MS (multiple sclerosis) (HCC)   ? Seizure (HCC)   ? Status epilepticus (HCC) 07/12/2018  ? ? ?Past Surgical History:  ?Procedure Laterality Date  ? NO PAST SURGERIES    ? ? ?There were no vitals filed for this visit. ? ? Subjective Assessment - 04/14/21 1110   ? ? Subjective The patient reports he is wearing his AFO today.   ? Pertinent History dx of MS June 2015 (relapsing/remitting), seizure hx   ? Patient Stated Goals Pt's goals for therapy are to strengthen L side and get back up to speed   ? Currently in Pain? No/denies   ? ?  ?  ? ?  ? ? ? ? ? ? ? ? ? ? ? ? ? ? ? ? ? ? ? ? [facility] Adult PT Treatment/Exercise - 04/14/21 1445   ? ?  ? Ambulation/Gait  ? Ambulation/Gait Yes   ? Ambulation/Gait Assistance 5: Supervision   ? Ambulation Distance (Feet) 75 Feet   short distances without device  ? Assistive device Straight cane;None   ?  ? Neuro Re-ed   ? Neuro Re-ed Details  Standing weight shifting  anterior/posterior initally from wall with min A due to moving too quickly anteriorly and not being able to control weight shift.  Moved to parallel bars with tactile cues from resistance bands to move ant/post within smaller limit of stability.  Performed alteraitng foot taps to 6" steps, then worked on tapping floor, 6" and 12" surface with cues for wegiht shift and balance. Narrow base of support with eyes closed and CGA for balance control, then progressed to 1/2 tandem stance with head motion workiong on postural control.   ?  ? Exercises  ? Exercises Knee/Hip   ?  ? Knee/Hip Exercises: Aerobic  ? Nustep level 4 x 3 minutes LEs only   ?  ? Knee/Hip Exercises: Standing  ? Forward Lunges Right;Left;10 reps   ? SLS with cues to soften L knee while attempting to roll a ball with R LE, then ball roll with L LE working on coordination   ? ?  ?  ? ?  ? ? ? ? ? ? ? ? ? ? ? ? PT Short Term Goals - 03/29/21 1540   ? ?  ? PT SHORT TERM GOAL #  1  ? Title Pt will be independent with HEP for improved strength, balance, transfers, and gait.   ? Time 3   ? Period Weeks   ? Status On-going   ? Target Date 04/14/21   ?  ? PT SHORT TERM GOAL #2  ? Title Pt will improve 5x sit<>stand to less than or equal to 20 seconds to demonstrate improved functional strength and transfer efficiency.   ? Baseline unable to perform, requires UE support   ? Time 3   ? Period Weeks   ? Status On-going   ? Target Date 04/14/21   ?  ? PT SHORT TERM GOAL #3  ? Title Demonstrate improved balance as evidenced by score 35/56 Berg Balance Test   ? Baseline 32/56   ? Time 3   ? Period Weeks   ? Status On-going   ? Target Date 04/14/21   ? ?  ?  ? ?  ? ? ? ? PT Long Term Goals - 03/29/21 1540   ? ?  ? PT LONG TERM GOAL #1  ? Title Pt will be independent with progression of HEP for improved strength, balance, transfers, and gait.   ? Time 6   ? Period Weeks   ? Status On-going   ? Target Date 05/05/21   ?  ? PT LONG TERM GOAL #2  ? Title Demo restoration of  gait speed to 2.2 ft/sec to improve safety and efficiency of gait   ? Baseline 1.56 ft/sec   ? Time 6   ? Period Weeks   ? Status On-going   ? Target Date 05/05/21   ?  ? PT LONG TERM GOAL #3  ? Title Pt will improve Berg score to at least 37/56 to decrease fall risk.   ? Baseline 32/56   ? Time 6   ? Period Weeks   ? Status On-going   ? Target Date 05/05/21   ? ?  ?  ? ?  ? ? ? ? ? ? ? ? Plan - 04/14/21 1443   ? ? Clinical Impression Statement PT emphasized control within limit of stability learning ant/posterior limit before needing to take a step.  PT working on balance activities to improve postural awareness and control during daily tasks.   ? PT Frequency 2x / week   ? PT Duration 6 weeks   ? PT Treatment/Interventions ADLs/Self Care Home Management;Gait training;Stair training;Functional mobility training;Therapeutic activities;Therapeutic exercise;Balance training;Neuromuscular re-education;DME Instruction;Manual techniques;Orthotic Fit/Training;Patient/family education;Energy conservation;Electrical Stimulation   ? PT Next Visit Plan Dynamic gait and balance, L LE strengthening, coordination/motor timing, and limits of stabilty   ? PT Home Exercise Plan walking with walker and AFO due to fall risk   ? Consulted and Agree with Plan of Care Patient   ? ?  ?  ? ?  ? ? ?Patient will benefit from skilled therapeutic intervention in order to improve the following deficits and impairments:  Abnormal gait, Decreased coordination, Difficulty walking, Impaired tone, Decreased balance, Postural dysfunction, Decreased strength, Decreased mobility ? ?Visit Diagnosis: ?Muscle weakness (generalized) ? ?Unsteadiness on feet ? ?Other lack of coordination ? ?Other abnormalities of gait and mobility ? ?Difficulty in walking, not elsewhere classified ? ? ? ? ?Problem List ?Patient Active Problem List  ? Diagnosis Date Noted  ? History of optic neuritis 07/29/2020  ? 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 High  risk medication use 01/28/2020  ? Seizure disorder (HCC) 12/04/2019  ? Marijuana use 12/04/2019  ?  Seizures (HCC) 01/22/2019  ? Gait disturbance 01/22/2019  ? Left-sided weakness 01/22/2019  ? Other fatigue 01/22/2019  ? Relapsing remitting multiple sclerosis (HCC) 07/12/2018  ? Encounter for screening for HIV 07/12/2018  ? ? ?Calix Heinbaugh, PT ?04/14/2021, 2:49 PM ? ?[place] ?[facility] ?[address], [address] ?[place], [place], [address] ?Phone: [phone]   Fax:  [phone] ? ?Name: JERMALE LABRUM ?MRN: [id number] ?Date of Birth: 1990-05-19 ? ? ? ?

## 2021-04-19 ENCOUNTER — Encounter: Payer: Medicare (Managed Care) | Admitting: Occupational Therapy

## 2021-04-21 ENCOUNTER — Encounter: Payer: Self-pay | Admitting: Occupational Therapy

## 2021-04-21 ENCOUNTER — Ambulatory Visit: Payer: Medicare (Managed Care) | Admitting: Occupational Therapy

## 2021-04-21 ENCOUNTER — Ambulatory Visit: Payer: Medicare (Managed Care) | Admitting: Physical Therapy

## 2021-04-21 ENCOUNTER — Encounter: Payer: Self-pay | Admitting: Physical Therapy

## 2021-04-21 DIAGNOSIS — R2681 Unsteadiness on feet: Secondary | ICD-10-CM

## 2021-04-21 DIAGNOSIS — R2689 Other abnormalities of gait and mobility: Secondary | ICD-10-CM

## 2021-04-21 DIAGNOSIS — M6281 Muscle weakness (generalized): Secondary | ICD-10-CM

## 2021-04-21 DIAGNOSIS — R278 Other lack of coordination: Secondary | ICD-10-CM

## 2021-04-21 NOTE — Therapy (Signed)
[place] ?[facility] ?[address], [address] ?[place], [place], [address] ?Phone: [phone]   Fax:  [phone] ? ?Occupational Therapy Treatment ? ?Patient Details  ?Name: Miguel Black ?MRN: [id number] ?Date of Birth: 08-15-1990 ?Referring Provider (OT): Lonia Blood, MD ? ? ?Encounter Date: 04/21/2021 ? ? OT End of Session - 04/21/21 1409   ? ? Visit Number 5   ? Number of Visits 7   ? Date for OT Re-Evaluation 05/05/21   ? Authorization Type [employer]vantage   ? OT Start Time 1405   ? OT Stop Time 1446   ? OT Time Calculation (min) 41 min   ? Activity Tolerance Patient tolerated treatment well   ? Behavior During Therapy [facility] for tasks assessed/performed   ? ?  ?  ? ?  ? ? ?Past Medical History:  ?Diagnosis Date  ? MS (multiple sclerosis) (HCC)   ? Seizure (HCC)   ? Status epilepticus (HCC) 07/12/2018  ? ? ?Past Surgical History:  ?Procedure Laterality Date  ? NO PAST SURGERIES    ? ? ?There were no vitals filed for this visit. ? ? Subjective Assessment - 04/21/21 1408   ? ? Subjective  "my coordination is getting better, I guess"   ? Pertinent History MS, seizures, regular marijuana use   ? Patient Stated Goals to get stronger   ? Currently in Pain? No/denies   ? ?  ?  ? ?  ? ? ?Activity tolerance with ambulating around therapy gym with [facility] and supervision overall to CGA when bending to pick up items from low range. Pt with one LOB when ambulating, due to decreased clearance of LLE, with pt able to correct without assist from therapist. ? ?GMC with picking up rings and moving from one cone to the other with focus on massed repetition.  Incorporated crossing midline and alternating UE.  Pt demonstrating coordination impairments bilaterally, however L continues to be worse than R. ? ?Discussed functional grasp on items when transporting items throughout home - pt reports utilizing RUE to hold items and then stabilize on counters, walls, furniture with LUE as pt  doesn't typically use cane in home.  Pt reports noticing minimal differences when using cane vs not using it when in the home setting, however notices a difference when in the community. Therapist reiterated recommendation of use of cane for longer distances for balance as well as energy conservation. ? ?Completed 9 hole peg test: 1:47.53.  Noted increased time and effort to pick up the first peg, but then increased coordination and therefore speed with remaining pegs.  Completed a 2nd time due to noted improvements after repetition.  Pt completed in 1:30.37 during 2nd attempt.   ? ? ? ? ? ? ? ? ? ? ? ? ? ? ? ? ? ? ? ? ? ? OT Short Term Goals - 04/12/21 1409   ? ?  ? OT SHORT TERM GOAL #1  ? Title Pt will verbalize understanding of energy conservation techniques   ? Time 3   ? Period Weeks   ? Status Achieved   ? Target Date 04/14/21   ?  ? OT SHORT TERM GOAL #2  ? Title Pt will be independent with HEP for improved coordination bilaterally.   ? Time 3   ? Period Weeks   ? Status Achieved   ? ?  ?  ? ?  ? ? ? ? OT Long Term Goals - 04/12/21  1409   ? ?  ? OT LONG TERM GOAL #1  ? Title Pt will verbalize understanding of task modifications and/or potential AE needs to increase ease, safety, and independence w/ ADLs   ? Time 6   ? Period Weeks   ? Status On-going   ? Target Date 05/05/21   ?  ? OT LONG TERM GOAL #2  ? Title Pt will perform dynamic standing task for simple IADLs w/o LOB using DME and/or countertop support prn   ? Time 6   ? Period Weeks   ? Status On-going   ?  ? OT LONG TERM GOAL #3  ? Title Pt will demonstrate improved UE functional use for ADLs as evidenced by increasing box/ blocks score by 5 blocks with LUE   ? Baseline R: 36 and L: 23   L: 26 on 04/12/21  ? Time 6   ? Period Weeks   ? Status On-going   ?  ? OT LONG TERM GOAL #4  ? Title Pt will demonstrate improved fine motor coordination for ADLs as evidenced by decreasing 9 hole peg test score for LUE by 10 secs   ? Baseline 1:35.16   1:38.63 on  04/12/21  ? Time 6   ? Period Weeks   ? Status On-going   ? ?  ?  ? ?  ? ? ? ? ? ? ? ? Plan - 04/21/21 1409   ? ? Clinical Impression Statement Pt reports mild improvements in coordination, but still notices decreased coordination bilaterally.  Pt tolerated functional moblity around treatment space with focus on retrieving items from various height surfaces to simulate homemaking tasks.  Pt with 1 LOB, but able to correct without assistance from therapist.  Pt utilized LUE and RUE throughout session as directed, demonstrating improved coordination in RUE > LUE.  Pt continues to demonstrate decreased coordination with LUE, however is able to utilize it as diminished to non-dominant level.   ? OT Occupational Profile and History Detailed Assessment- Review of Records and additional review of physical, cognitive, psychosocial history related to current functional performance   ? Occupational performance deficits (Please refer to evaluation for details): ADL's;Leisure;IADL's   ? Body Structure / Function / Physical Skills ADL;Balance;Coordination;Decreased knowledge of precautions;Decreased knowledge of use of DME;Endurance;FMC;GMC;IADL;Mobility;Pain;Strength;UE functional use   ? Rehab Potential Good   ? Clinical Decision Making Limited treatment options, no task modification necessary   ? Comorbidities Affecting Occupational Performance: May have comorbidities impacting occupational performance   ? Modification or Assistance to Complete Evaluation  No modification of tasks or assist necessary to complete eval   ? OT Frequency 1x / week   ? OT Duration 6 weeks   ? OT Treatment/Interventions Self-care/ADL training;Aquatic Therapy;Moist Heat;Cryotherapy;Ultrasound;Therapeutic exercise;Neuromuscular education;Energy conservation;DME and/or AE instruction;Building services engineer;Therapeutic activities;Cognitive remediation/compensation;Patient/family education;Balance training   ? Plan Fine and gross motor control,  functional mobility with coordination, MS education/energy conservation   ? Consulted and Agree with Plan of Care Patient   ? ?  ?  ? ?  ? ? ?Patient will benefit from skilled therapeutic intervention in order to improve the following deficits and impairments:   ?Body Structure / Function / Physical Skills: ADL, Balance, Coordination, Decreased knowledge of precautions, Decreased knowledge of use of DME, Endurance, FMC, GMC, IADL, Mobility, Pain, Strength, UE functional use ?  ?  ? ? ?Visit Diagnosis: ?Muscle weakness (generalized) ? ?Other lack of coordination ? ?Unsteadiness on feet ? ?Other abnormalities of gait and mobility ? ? ? ?  Problem List ?Patient Active Problem List  ? Diagnosis Date Noted  ? History of optic neuritis 07/29/2020  ? 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 High risk medication use 01/28/2020  ? Seizure disorder (HCC) 12/04/2019  ? Marijuana use 12/04/2019  ? Seizures (HCC) 01/22/2019  ? Gait disturbance 01/22/2019  ? Left-sided weakness 01/22/2019  ? Other fatigue 01/22/2019  ? Relapsing remitting multiple sclerosis (HCC) 07/12/2018  ? Encounter for screening for HIV 07/12/2018  ? ? Rosalio Loud, OT ?04/21/2021, 4:26 PM ? ?[place] ?[facility] ?[address], [address] ?[place], [place], [address] ?Phone: [phone]   Fax:  [phone] ? ?Name: Miguel Black ?MRN: [id number] ?Date of Birth: 03-Oct-1990 ? ?

## 2021-04-21 NOTE — Therapy (Signed)
[place] ?[facility] ?[address], [address] ?[place], [place], [address] ?Phone: [phone]   Fax:  [phone] ? ?Physical Therapy Treatment ? ?Patient Details  ?Name: Miguel Black ?MRN: [id number] ?Date of Birth: [birth year] ?Referring Provider (PT): Arlice Colt, MD ? ? ?Encounter Date: 04/21/2021 ? ? PT End of Session - 04/21/21 1439   ? ? Visit Number 8   ? Number of Visits 12   ? Date for PT Re-Evaluation 05/05/21   ? Authorization Type [employer] Medicare/Medicaid (check visit diagnosis list)   ? Authorization Time Period RadMD Approved 12 PT Visits 03/24/2021-05/24/2021   ? Authorization - Visit Number 8   ? Authorization - Number of Visits 12   ? Progress Note Due on Visit 10   ? PT Start Time 6962   ? PT Stop Time 1526   ? PT Time Calculation (min) 40 min   ? Equipment Utilized During Treatment Gait belt   ? Activity Tolerance Patient tolerated treatment well   ? Behavior During Therapy [facility] for tasks assessed/performed   ? ?  ?  ? ?  ? ? ?Past Medical History:  ?Diagnosis Date  ? MS (multiple sclerosis) ([place])   ? Seizure ([place])   ? Status epilepticus ([place]) 07/12/2018  ? ? ?Past Surgical History:  ?Procedure Laterality Date  ? NO PAST SURGERIES    ? ? ?There were no vitals filed for this visit. ? ? Subjective Assessment - 04/21/21 1439   ? ? Subjective Mostly try to wear the AFO most of the day-put it on after showering.   ? Pertinent History dx of MS June 2015 (relapsing/remitting), seizure hx   ? Patient Stated Goals Pt's goals for therapy are to strengthen L side and get back up to speed   ? ?  ?  ? ?  ? ? ? ? ? [facility] PT Assessment - 04/21/21 0001   ? ?  ? Berg Balance Test  ? Sit to Stand Able to stand without using hands and stabilize independently   ? Standing Unsupported Able to stand safely 2 minutes   ? Sitting with Back Unsupported but Feet Supported on Floor or Stool Able to sit safely and securely 2 minutes   ? Stand to Sit Sits safely with minimal use of hands   ?  Transfers Able to transfer safely, definite need of hands   ? Standing Unsupported with Eyes Closed Able to stand 10 seconds safely   ? Standing Unsupported with Feet Together Able to place feet together independently and stand for 1 minute with supervision   ? From Standing, Reach Forward with Outstretched Arm Can reach forward >5 cm safely (2")   ? From Standing Position, Pick up Object from [place] to pick up shoe safely and easily   ? From Standing Position, Turn to Look Behind Over each Shoulder Looks behind from both sides and weight shifts well   ? Turn 360 Degrees Able to turn 360 degrees safely but slowly   ? Standing Unsupported, Alternately Place Feet on Step/Stool Needs assistance to keep from falling or unable to try   ? Standing Unsupported, One Foot in Front Able to take small step independently and hold 30 seconds   ? Standing on One Leg Unable to try or needs assist to prevent fall   ? Total Score 40   ? Berg comment: Improved from 32/56, still indicates increased fall risk.   ? ?  ?  ? ?  ? ? ? ? ? ? ? ? ? ?  Reviewed HEP: ? ?Sit to stand, 2 sets x 5 reps ? ?Alternating step taps at 6? step, 2 x 10 reps ? ?Bridging with theraband around knees, 2 x 10 reps ? ?Standing on foam:  head turns/nods, trunk rotation to pass objects behind back ? ?Pt reports not doing terminal knee extension at home; performed/reviewed in session with green theraband; pt return demo understanding with cues for technique. ? ? ? ? ? ? ? [facility] PT Treatment/Exercise - 04/21/21 0001   ? ?  ? Transfers  ? Transfers Sit to Stand;Stand to Sit   ? Sit to Stand 5: Supervision;Without upper extremity assist;From chair/3-in-1   ? Five time sit to stand comments  17.85   ? Stand to Sit 5: Supervision;Without upper extremity assist;To chair/3-in-1   ? Number of Reps 2 sets;Other reps (comment)   5 reps  ?  ? Ambulation/Gait  ? Ambulation/Gait Yes   ? Ambulation/Gait Assistance 5: Supervision   ? Ambulation Distance (Feet) 100 Feet    40 ft x 4  ? Assistive device Straight cane   ?  ? Knee/Hip Exercises: Supine  ? Other Supine Knee/Hip Exercises clam with green TB 10x, 2 sets   ? ?  ?  ? ?  ? ? ? ? ? ? ? ? ? ? ? ? PT Short Term Goals - 04/21/21 1502   ? ?  ? PT SHORT TERM GOAL #1  ? Title Pt will be independent with HEP for improved strength, balance, transfers, and gait.   ? Time 3   ? Period Weeks   ? Status Partially Met   ? Target Date 04/14/21   ?  ? PT SHORT TERM GOAL #2  ? Title Pt will improve 5x sit<>stand to less than or equal to 20 seconds to demonstrate improved functional strength and transfer efficiency.   ? Baseline unable to perform, requires UE support; 17.85 sec 04/21/21   ? Time 3   ? Period Weeks   ? Status Achieved   ? Target Date 04/14/21   ?  ? PT SHORT TERM GOAL #3  ? Title Demonstrate improved balance as evidenced by score 35/56 Berg Balance Test   ? Baseline 32/56; 40/56 04/21/2021   ? Time 3   ? Period Weeks   ? Status Achieved   ? Target Date 04/14/21   ? ?  ?  ? ?  ? ? ? ? PT Long Term Goals - 04/21/21 1534   ? ?  ? PT LONG TERM GOAL #1  ? Title Pt will be independent with progression of HEP for improved strength, balance, transfers, and gait.   ? Time 6   ? Period Weeks   ? Status On-going   ? Target Date 05/05/21   ?  ? PT LONG TERM GOAL #2  ? Title Demo restoration of gait speed to 2.2 ft/sec to improve safety and efficiency of gait   ? Baseline 1.56 ft/sec   ? Time 6   ? Period Weeks   ? Status On-going   ? Target Date 05/05/21   ?  ? PT LONG TERM GOAL #3  ? Title Pt will improve Berg score to at least 43/56 to decrease fall risk.   ? Baseline 32/56>40/56   ? Time 6   ? Period Weeks   ? Status Revised   ? Target Date 05/05/21   ? ?  ?  ? ?  ? ? ? ? ? ? ? ?  Plan - 04/21/21 1529   ? ? Clinical Impression Statement Assessed STGs this visit, with pt meeting 2 of 3 STGs.  Pt has partially met STG 1 for HEP independence (reports not doing all HEP at home; needs min cues for several of the exercises with review).  STG 2  met for 5x sit<>stand and STG 3 met for imrpoved Berg score, indicating increased functional strength and improved balance.  While pt is improving, he does still remain at fall risk for both 5x sit<>stand and Berg scores.  He is progressing with therapy and will benefit from continued skilled PT to further address balance, stregnth for improved overall functional mobility and decreased fall risk.   ? PT Frequency 2x / week   ? PT Duration 6 weeks   ? PT Treatment/Interventions ADLs/Self Care Home Management;Gait training;Stair training;Functional mobility training;Therapeutic activities;Therapeutic exercise;Balance training;Neuromuscular re-education;DME Instruction;Manual techniques;Orthotic Fit/Training;Patient/family education;Energy conservation;Electrical Stimulation   ? PT Next Visit Plan Dynamic gait and balance, L LE strengthening, coordination/motor timing, and limits of stabilty.  Work towards [employer].   ? PT Home Exercise Plan walking with walker and AFO due to fall risk   ? Consulted and Agree with Plan of Care Patient   ? ?  ?  ? ?  ? ? ?Patient will benefit from skilled therapeutic intervention in order to improve the following deficits and impairments:  Abnormal gait, Decreased coordination, Difficulty walking, Impaired tone, Decreased balance, Postural dysfunction, Decreased strength, Decreased mobility ? ?Visit Diagnosis: ?Muscle weakness (generalized) ? ?Unsteadiness on feet ? ?Other abnormalities of gait and mobility ? ? ? ? ?Problem List ?Patient Active Problem List  ? Diagnosis Date Noted  ? History of optic neuritis 07/29/2020  ? 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 High risk medication use 01/28/2020  ? Seizure disorder ([place]) 12/04/2019  ? Marijuana use 12/04/2019  ? Seizures ([place]) 01/22/2019  ? Gait disturbance 01/22/2019  ? Left-sided weakness 01/22/2019  ? Other fatigue 01/22/2019  ? Relapsing remitting multiple sclerosis ([place]) 07/12/2018  ? Encounter for screening for HIV  07/12/2018  ? ? ?Heloise Gordan W., PT ?04/21/2021, 3:35 PM ? ?[place] ?[facility] ?[address], [address] ?[place], [place], [address] ?Phone: [phone]   Fax:  [phone]

## 2021-04-26 ENCOUNTER — Encounter: Payer: Medicare (Managed Care) | Admitting: Occupational Therapy

## 2021-04-28 ENCOUNTER — Encounter: Payer: Self-pay | Admitting: Physical Therapy

## 2021-04-28 ENCOUNTER — Encounter: Payer: Self-pay | Admitting: Occupational Therapy

## 2021-04-28 ENCOUNTER — Ambulatory Visit: Payer: Medicare (Managed Care) | Attending: Internal Medicine | Admitting: Occupational Therapy

## 2021-04-28 ENCOUNTER — Ambulatory Visit: Payer: Medicare (Managed Care) | Admitting: Physical Therapy

## 2021-04-28 DIAGNOSIS — R2681 Unsteadiness on feet: Secondary | ICD-10-CM | POA: Diagnosis present

## 2021-04-28 DIAGNOSIS — M6281 Muscle weakness (generalized): Secondary | ICD-10-CM | POA: Insufficient documentation

## 2021-04-28 DIAGNOSIS — R278 Other lack of coordination: Secondary | ICD-10-CM | POA: Diagnosis present

## 2021-04-28 DIAGNOSIS — R2689 Other abnormalities of gait and mobility: Secondary | ICD-10-CM | POA: Diagnosis present

## 2021-04-28 NOTE — Therapy (Signed)
[place] ?[facility] ?[address], [address] ?[place], [place], [address] ?Phone: [phone]   Fax:  [phone] ? ?Occupational Therapy Treatment ? ?Patient Details  ?Name: Miguel Black ?MRN: [id number] ?Date of Birth: 03/05/1990 ?Referring Provider (OT): Lonia Blood, MD ? ? ?Encounter Date: 04/28/2021 ? ? OT End of Session - 04/28/21 4008   ? ? Visit Number 6   ? Number of Visits 7   ? Date for OT Re-Evaluation 05/05/21   ? Authorization Type [employer] Medicare Advantage   ? OT Start Time 0935   ? OT Stop Time 1017   ? OT Time Calculation (min) 42 min   ? Activity Tolerance Patient tolerated treatment well   ? Behavior During Therapy [facility] for tasks assessed/performed   ? ?  ?  ? ?  ? ? ?Past Medical History:  ?Diagnosis Date  ? MS (multiple sclerosis) (HCC)   ? Seizure (HCC)   ? Status epilepticus (HCC) 07/12/2018  ? ? ?Past Surgical History:  ?Procedure Laterality Date  ? NO PAST SURGERIES    ? ? ?There were no vitals filed for this visit. ? ? Subjective Assessment - 04/28/21 0937   ? ? Subjective  Pt reports being tired this morning, stating that he went to bed late and then woke up late this morning.   ? Pertinent History MS, seizures, regular marijuana use   ? Patient Stated Goals to get stronger   ? Currently in Pain? No/denies   ? ?  ?  ? ?  ? ? ?Therapeutic activity:  Functional mobility with focus on ambulating around therapy gym while transporting laundry basket in BUE to challenge balance and endurance during functional homemaking tasks.  Pt retrieving items from moderate to high range in cabinet with LUE and crossing midline, incorporating trunk rotation to place items in basket on R.  Pt demonstrating good balance, and intermittent use of countertop for UE support.  Pt then completing task with RUE to continue focus on balance and endurance during simulated homemaking tasks.   ? ?FMC: Engaged in pattern replication with small peg board alternating LUE and RUE.  Pt  demonstrating improved motor control with no dropping of pegs with either hand, still demonstrating mild-moderate shakiness and increased time when picking up and manipulating pegs in finger tips but significant improvements when placing into holes in board.  Therapist challenged pt to remove pegs and place in hand and then translate pegs from palm of hand to finger tips to place in container to challenge in-hand manipulation and translation with [facility].   ? ? ? ? ? ? ? ? ? ? ? ? ? ? ? ? ? ? ? ? ? ? ? ? OT Short Term Goals - 04/12/21 1409   ? ?  ? OT SHORT TERM GOAL #1  ? Title Pt will verbalize understanding of energy conservation techniques   ? Time 3   ? Period Weeks   ? Status Achieved   ? Target Date 04/14/21   ?  ? OT SHORT TERM GOAL #2  ? Title Pt will be independent with HEP for improved coordination bilaterally.   ? Time 3   ? Period Weeks   ? Status Achieved   ? ?  ?  ? ?  ? ? ? ? OT Long Term Goals - 04/12/21 1409   ? ?  ? OT LONG TERM GOAL #1  ? Title Pt will verbalize understanding of task modifications and/or  potential AE needs to increase ease, safety, and independence w/ ADLs   ? Time 6   ? Period Weeks   ? Status On-going   ? Target Date 05/05/21   ?  ? OT LONG TERM GOAL #2  ? Title Pt will perform dynamic standing task for simple IADLs w/o LOB using DME and/or countertop support prn   ? Time 6   ? Period Weeks   ? Status On-going   ?  ? OT LONG TERM GOAL #3  ? Title Pt will demonstrate improved UE functional use for ADLs as evidenced by increasing box/ blocks score by 5 blocks with LUE   ? Baseline R: 36 and L: 23   L: 26 on 04/12/21  ? Time 6   ? Period Weeks   ? Status On-going   ?  ? OT LONG TERM GOAL #4  ? Title Pt will demonstrate improved fine motor coordination for ADLs as evidenced by decreasing 9 hole peg test score for LUE by 10 secs   ? Baseline 1:35.16   1:38.63 on 04/12/21  ? Time 6   ? Period Weeks   ? Status On-going   ? ?  ?  ? ?  ? ? ? ? ? ? ? ? Plan - 04/28/21 0941   ? ? Clinical  Impression Statement Pt reports noticing improvements in coordination when completing structured tasks and typical routine tasks at home.  Pt tolerated dynamic standing and trunk rotation to incorporate reaching outside BOS into high and low ranges to simulate homemaking tasks.  Pt ambulating without AD to transport item with BUE to simulate homemaking tasks.  Pt with no LOB this sessoin.  Pt utilized LUE and RUE throughout session as directed, demonstrating improved coordination with [facility] tasks with pegs, in-hand manipulation, and translation.   ? OT Occupational Profile and History Detailed Assessment- Review of Records and additional review of physical, cognitive, psychosocial history related to current functional performance   ? Occupational performance deficits (Please refer to evaluation for details): ADL's;Leisure;IADL's   ? Body Structure / Function / Physical Skills ADL;Balance;Coordination;Decreased knowledge of precautions;Decreased knowledge of use of DME;Endurance;FMC;GMC;IADL;Mobility;Pain;Strength;UE functional use   ? Rehab Potential Good   ? Clinical Decision Making Limited treatment options, no task modification necessary   ? Comorbidities Affecting Occupational Performance: May have comorbidities impacting occupational performance   ? Modification or Assistance to Complete Evaluation  No modification of tasks or assist necessary to complete eval   ? OT Frequency 1x / week   ? OT Duration 6 weeks   ? OT Treatment/Interventions Self-care/ADL training;Aquatic Therapy;Moist Heat;Cryotherapy;Ultrasound;Therapeutic exercise;Neuromuscular education;Energy conservation;DME and/or AE instruction;Building services engineer;Therapeutic activities;Cognitive remediation/compensation;Patient/family education;Balance training   ? Plan Reiterate MS education/energy conservation, check off goals and d/c after next session.   ? Consulted and Agree with Plan of Care Patient   ? ?  ?  ? ?  ? ? ?Patient will benefit  from skilled therapeutic intervention in order to improve the following deficits and impairments:   ?Body Structure / Function / Physical Skills: ADL, Balance, Coordination, Decreased knowledge of precautions, Decreased knowledge of use of DME, Endurance, FMC, GMC, IADL, Mobility, Pain, Strength, UE functional use ?  ?  ? ? ?Visit Diagnosis: ?Muscle weakness (generalized) ? ?Unsteadiness on feet ? ?Other abnormalities of gait and mobility ? ?Other lack of coordination ? ? ? ?Problem List ?Patient Active Problem List  ? Diagnosis Date Noted  ? History of optic neuritis 07/29/2020  ? Internuclear ophthalmoplegia of  right eye 07/29/2020  ? High risk medication use 01/28/2020  ? Seizure disorder (HCC) 12/04/2019  ? Marijuana use 12/04/2019  ? Seizures (HCC) 01/22/2019  ? Gait disturbance 01/22/2019  ? Left-sided weakness 01/22/2019  ? Other fatigue 01/22/2019  ? Relapsing remitting multiple sclerosis (HCC) 07/12/2018  ? Encounter for screening for HIV 07/12/2018  ? ? Rosalio Loud, OT ?04/28/2021, 10:28 AM ? ?[place] ?[facility] ?[address], [address] ?[place], [place], [address] ?Phone: [phone]   Fax:  [phone] ? ?Name: DEMYAN HERDEGEN ?MRN: [id number] ?Date of Birth: 01/01/1991 ? ?

## 2021-04-28 NOTE — Therapy (Signed)
[place] ?[facility] ?[address], [address] ?[place], [place], [address] ?Phone: [phone]   Fax:  [phone] ? ?Physical Therapy Treatment ? ?Patient Details  ?Name: DEMARKIS Black ?MRN: [id number] ?Date of Birth: September 02, 1990 ?Referring Provider (PT): Arlice Colt, MD ? ? ?Encounter Date: 04/28/2021 ? ? PT End of Session - 04/28/21 1325   ? ? Visit Number 9   ? Number of Visits 12   ? Date for PT Re-Evaluation 05/05/21   ? Authorization Type [employer] Medicare/Medicaid (check visit diagnosis list)   ? Authorization Time Period RadMD Approved 12 PT Visits 03/24/2021-05/24/2021   ? Authorization - Visit Number 9   ? Authorization - Number of Visits 12   ? Progress Note Due on Visit 10   ? PT Start Time 1016   ? PT Stop Time 1058   ? PT Time Calculation (min) 42 min   ? Equipment Utilized During Treatment Gait belt   ? Activity Tolerance Patient tolerated treatment well   ? Behavior During Therapy [facility] for tasks assessed/performed   ? ?  ?  ? ?  ? ? ?Past Medical History:  ?Diagnosis Date  ? MS (multiple sclerosis) ([place])   ? Seizure ([place])   ? Status epilepticus ([place]) 07/12/2018  ? ? ?Past Surgical History:  ?Procedure Laterality Date  ? NO PAST SURGERIES    ? ? ?There were no vitals filed for this visit. ? ? Subjective Assessment - 04/28/21 1018   ? ? Subjective Pt reports no changes since previous session - reports he is doing better, "making good progress"; pt wearing AFO - states he walks in home without SPC most of the time   ? Pertinent History dx of MS June 2015 (relapsing/remitting), seizure hx   ? Patient Stated Goals Pt's goals for therapy are to strengthen L side and get back up to speed   ? Currently in Pain? No/denies   ? ?  ?  ? ?  ? ? ? ? ? ? ? ? ? ? ? ? ? ? ? ? ? ? ? ? [place] Adult PT Treatment/Exercise - 04/28/21 1029   ? ?  ? Transfers  ? Transfers Sit to Stand;Stand to Sit   ? Sit to Stand 4: Min guard   ? Number of Reps 10 reps   ? Comments Rt foot was placed on 4" block  for increased weight bearing & strengthening;  performed without UE support from mat surface   ?  ? Ambulation/Gait  ? Ambulation/Gait Yes   ? Ambulation/Gait Assistance 4: Min guard   ? Ambulation Distance (Feet) 50 Feet   ? Assistive device None   ?  ? Exercises  ? Exercises Knee/Hip   ?  ? Lumbar Exercises: Stretches  ? Active Hamstring Stretch Left;2 reps;20 seconds   pt in seated position on mat - LLE was propped on PT's leg - cues to sit erect for increased stretch  ?  ? Knee/Hip Exercises: Supine  ? Heel Slides AROM;Left;1 set;10 reps   knee to chest with extension with min assist & cues for slow controlled movement with extension back down to mat  ? Bridges AROM;1 set;5 reps   ? Bridges with Clamshell AROM;Both;1 set;5 reps   bridging with hip abdct./adduction 5 reps  ? Other Supine Knee/Hip Exercises marching with RLE extension 5 reps with cues for technique   ? ?  ?  ? ?  ? ?NeuroRe-ed: ?Cone taps to 2 cones with  min assist with each foot to improve SLS on each leg and coordination with RLE ? ?Tall kneeling - 10 small mini squats with min. UE support on mat ? ?Lt 1/2 kneeling with mod to max assist to maintain balance ? ?Floor to stand with CGA with UE support on mat for return to seated position ? ? Balance Exercises - 04/28/21 0001   ? ?  ? Balance Exercises: Standing  ? Rockerboard Anterior/posterior;Lateral;10 reps;UE support   min UE support on // bars with CGA to min assist  ? Marching Solid surface;Static;10 reps   progressed to marching forward/backward inside // bars 2 reps (10' x 4) with min to mod assist for balance recovery due to decr. SLS on LLE  ? ?  ?  ? ?  ? ? ? ? ? ? ? PT Short Term Goals - 04/28/21 1331   ? ?  ? PT SHORT TERM GOAL #1  ? Title Pt will be independent with HEP for improved strength, balance, transfers, and gait.   ? Time 3   ? Period Weeks   ? Status Partially Met   ? Target Date 04/14/21   ?  ? PT SHORT TERM GOAL #2  ? Title Pt will improve 5x sit<>stand to less than or  equal to 20 seconds to demonstrate improved functional strength and transfer efficiency.   ? Baseline unable to perform, requires UE support; 17.85 sec 04/21/21   ? Time 3   ? Period Weeks   ? Status Achieved   ? Target Date 04/14/21   ?  ? PT SHORT TERM GOAL #3  ? Title Demonstrate improved balance as evidenced by score 35/56 Berg Balance Test   ? Baseline 32/56; 40/56 04/21/2021   ? Time 3   ? Period Weeks   ? Status Achieved   ? Target Date 04/14/21   ? ?  ?  ? ?  ? ? ? ? PT Long Term Goals - 04/28/21 1331   ? ?  ? PT LONG TERM GOAL #1  ? Title Pt will be independent with progression of HEP for improved strength, balance, transfers, and gait.   ? Time 6   ? Period Weeks   ? Status On-going   ? Target Date 05/05/21   ?  ? PT LONG TERM GOAL #2  ? Title Demo restoration of gait speed to 2.2 ft/sec to improve safety and efficiency of gait   ? Baseline 1.56 ft/sec   ? Time 6   ? Period Weeks   ? Status On-going   ? Target Date 05/05/21   ?  ? PT LONG TERM GOAL #3  ? Title Pt will improve Berg score to at least 43/56 to decrease fall risk.   ? Baseline 32/56>40/56   ? Time 6   ? Period Weeks   ? Status Revised   ? Target Date 05/05/21   ? ?  ?  ? ?  ? ? ? ? ? ? ? ? Plan - 04/28/21 1326   ? ? Clinical Impression Statement Pt had much difficulty performing transitional movement from tall kneeling to Lt 1/2 kneeling and needed mod assist for positioning RLE in 1/2 kneel position on mat on floor; needed max assist to maintain upright balance in Lt 1/2 kneeling position.  Pt also needed UE support to maintain balance with marching inside // bars due to decreased SLS on RLE.  Pt was able to perform sit to stand from mat without UE  support with increased weight bearing on RLE without UE support with Lt foot placed on step.  Pt required short frequent rest periods during session due to c/o fatigue.  Pt is progressing well and will benefit from continued PT to address LLE weakness, gait and balance deficits.   ? PT Frequency 2x /  week   ? PT Duration 6 weeks   ? PT Treatment/Interventions ADLs/Self Care Home Management;Gait training;Stair training;Functional mobility training;Therapeutic activities;Therapeutic exercise;Balance training;Neuromuscular re-education;DME Instruction;Manual techniques;Orthotic Fit/Training;Patient/family education;Energy conservation;Electrical Stimulation   ? PT Next Visit Plan 10th visit progress note due - Dynamic gait and balance, L LE strengthening, coordination/motor timing, and limits of stabilty.  Work towards [employer].   ? PT Home Exercise Plan walking with walker and AFO due to fall risk   ? Consulted and Agree with Plan of Care Patient   ? ?  ?  ? ?  ? ? ?Patient will benefit from skilled therapeutic intervention in order to improve the following deficits and impairments:  Abnormal gait, Decreased coordination, Difficulty walking, Impaired tone, Decreased balance, Postural dysfunction, Decreased strength, Decreased mobility ? ?Visit Diagnosis: ?Muscle weakness (generalized) ? ?Unsteadiness on feet ? ?Other abnormalities of gait and mobility ? ? ? ? ?Problem List ?Patient Active Problem List  ? Diagnosis Date Noted  ? History of optic neuritis 07/29/2020  ? 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 High risk medication use 01/28/2020  ? Seizure disorder ([place]) 12/04/2019  ? Marijuana use 12/04/2019  ? Seizures ([place]) 01/22/2019  ? Gait disturbance 01/22/2019  ? Left-sided weakness 01/22/2019  ? Other fatigue 01/22/2019  ? Relapsing remitting multiple sclerosis ([place]) 07/12/2018  ? Encounter for screening for HIV 07/12/2018  ? ? ?UGQBVQ, XIHWT [id number], PT ?04/28/2021, 1:33 PM ? ?[place] ?[facility] ?[address], [address] ?[place], [place], [address] ?Phone: [phone]   Fax:  [phone] ? ?Name: AERO DRUMMONDS ?MRN: [id number] ?Date of Birth: 1990-11-07 ? ? ? ?

## 2021-05-03 ENCOUNTER — Encounter: Payer: Self-pay | Admitting: Physical Therapy

## 2021-05-03 ENCOUNTER — Ambulatory Visit: Payer: Medicare (Managed Care) | Admitting: Occupational Therapy

## 2021-05-03 ENCOUNTER — Ambulatory Visit: Payer: Medicare (Managed Care) | Admitting: Physical Therapy

## 2021-05-03 ENCOUNTER — Encounter: Payer: Self-pay | Admitting: Occupational Therapy

## 2021-05-03 DIAGNOSIS — M6281 Muscle weakness (generalized): Secondary | ICD-10-CM | POA: Diagnosis not present

## 2021-05-03 DIAGNOSIS — R2681 Unsteadiness on feet: Secondary | ICD-10-CM

## 2021-05-03 DIAGNOSIS — R2689 Other abnormalities of gait and mobility: Secondary | ICD-10-CM

## 2021-05-03 DIAGNOSIS — R278 Other lack of coordination: Secondary | ICD-10-CM

## 2021-05-03 NOTE — Therapy (Signed)
[place] ?[facility] ?[address], [address] ?[place], [place], [address] ?Phone: [phone]   Fax:  [phone] ? ?Occupational Therapy Treatment and Discharge ? ?Patient Details  ?Name: Miguel Black ?MRN: [id number] ?Date of Birth: 12/25/1990 ?Referring Provider (OT): Cherene Altes, MD ? ?OCCUPATIONAL THERAPY DISCHARGE SUMMARY ? ?Visits from Start of Care: 7 ? ?Current functional level related to goals / functional outcomes: ?Pt has met 3 of 4 LTGs with improvements towards 4th goal.  Pt is able to demonstrate improved coordination of LUE during fine and gross motor tasks, but is also able to verbalize and demonstrate adaptive techniques to increase success.  Pt is able to utilize adaptive techniques and energy conservation strategies to increase independence and safety with ADLs and IADLs and report understanding of when to use each vs challenging self to maintain current level of function. ?  ?Remaining deficits: ?LUE coordination, however improved from most recent seizure.  ?  ?Education / Equipment: ?Pt has been provided with MS education in regards to social supports/emotional wellbeing, energy conservation strategies, and HEP for gross and fine motor coordination.  ? ?Patient agrees to discharge. Patient goals were met. Patient is being discharged due to meeting the stated rehab goals.. ? ? ? ? ? ?Encounter Date: 05/03/2021 ? ? OT End of Session - 05/03/21 1406   ? ? Visit Number 7   ? Number of Visits 7   ? Date for OT Re-Evaluation 05/05/21   ? Authorization Type [employer] Medicare Advantage   ? OT Start Time 1400   ? OT Stop Time 1438   ? OT Time Calculation (min) 38 min   ? Activity Tolerance Patient tolerated treatment well   ? Behavior During Therapy [facility] for tasks assessed/performed   ? ?  ?  ? ?  ? ? ?Past Medical History:  ?Diagnosis Date  ? MS (multiple sclerosis) ([place])   ? Seizure ([place])   ? Status epilepticus ([place]) 07/12/2018  ? ? ?Past Surgical History:   ?Procedure Laterality Date  ? NO PAST SURGERIES    ? ? ?There were no vitals filed for this visit. ? ? Subjective Assessment - 05/03/21 1404   ? ? Subjective  Pt reports watching some fights on tv   ? Pertinent History MS, seizures, regular marijuana use   ? Patient Stated Goals to get stronger   ? Currently in Pain? No/denies   ? ?  ?  ? ?  ? ? ?ADL: Pt reports Mod I with all ADLs, does use the shower chair for safety and energy conservation with bathing.  Pt reports occasionally needing a standing or sitting rest break during IADLs, but usually "forces myself to finish" before resting.  Educated on standing rest breaks and dividing up tasks throughout day or week for energy conservation.  Educated on continued focus on HEP for balance, endurance, and coordination to continue to maintain function that he has regained. ? ?Box and blocks: frequently when attempting to pick up blocks noticed decreased coordination.  Therefore pt utilized scooping motion against wall of box with improved ability to pick up. Pt able to pick up 26 blocks in the one minute time limit. Discussed use of adaptive strategies to increase success during functional tasks. ? ?9 hole peg test: Completed x2: 1:25.43 and 1:06.87.  Educated on repetition as coordination improved second time after repetition.  Reiterated HEP for coordination to allow for repetition of [facility].  Engaged in ball toss with pt able to  toss ball R <> L x3-4 without dropping, however noting decreased gross motor coordination due to decreased control with LUE.  Transitioned to [facility] with picking up variety of small items.  Pt picking up sliding off table initially, however able to complete with increased focus on picking up instead of sliding.  Therapist reiterated use of adaptive strategies vs challenging coordination on case by case to allow for most improved success during specific task or day. ? ? [facility] OT Assessment - 05/03/21 0001   ? ?  ? Coordination  ? Left 9 Hole Peg  Test 1:06.87   1:35.16 on eval  ? Box and Blocks L: 26   L: 23 on eval  ? ?  ?  ? ?  ? ? ? ? ? ? ? ? ? ? ? ? ? ? ? ? ? ? ? ? ? OT Short Term Goals - 04/12/21 1409   ? ?  ? OT SHORT TERM GOAL #1  ? Title Pt will verbalize understanding of energy conservation techniques   ? Time 3   ? Period Weeks   ? Status Achieved   ? Target Date 04/14/21   ?  ? OT SHORT TERM GOAL #2  ? Title Pt will be independent with HEP for improved coordination bilaterally.   ? Time 3   ? Period Weeks   ? Status Achieved   ? ?  ?  ? ?  ? ? ? ? OT Long Term Goals - 05/03/21 1406   ? ?  ? OT LONG TERM GOAL #1  ? Title Pt will verbalize understanding of task modifications and/or potential AE needs to increase ease, safety, and independence w/ ADLs   ? Time 6   ? Period Weeks   ? Status Achieved   ? Target Date 05/05/21   ?  ? OT LONG TERM GOAL #2  ? Title Pt will perform dynamic standing task for simple IADLs w/o LOB using DME and/or countertop support prn   ? Time 6   ? Period Weeks   ? Status Achieved   ?  ? OT LONG TERM GOAL #3  ? Title Pt will demonstrate improved UE functional use for ADLs as evidenced by increasing box/ blocks score by 5 blocks with LUE   26 blocks  ? Baseline R: 36 and L: 23   L: 26 on 04/12/21  ? Time 6   ? Period Weeks   ? Status Partially Met   ?  ? OT LONG TERM GOAL #4  ? Title Pt will demonstrate improved fine motor coordination for ADLs as evidenced by decreasing 9 hole peg test score for LUE by 10 secs   1:06.87  ? Baseline 1:35.16   ? Time 6   ? Period Weeks   ? Status Achieved   ? ?  ?  ? ?  ? ? ? ? ? ? ? ? Plan - 05/03/21 1406   ? ? Clinical Impression Statement Pt reports and demonstrates improvements in coordination bilaterally.  Pt reports use of LUE at diminished level to non-dominant during ADLs and IADLs depending on complexity of task.  Pt is able to report energy conservation strategies and techniques to increase safety and independence with ADL and IADLs.  Pt reports understanding of compensatory  techniques and use of compensatory strategies when appropriate and challenging self when appropriate to maintain function he has regained.   ? OT Occupational Profile and History Detailed Assessment- Review of Records and additional  review of physical, cognitive, psychosocial history related to current functional performance   ? Occupational performance deficits (Please refer to evaluation for details): ADL's;Leisure;IADL's   ? Body Structure / Function / Physical Skills ADL;Balance;Coordination;Decreased knowledge of precautions;Decreased knowledge of use of DME;Endurance;FMC;GMC;IADL;Mobility;Pain;Strength;UE functional use   ? Rehab Potential Good   ? Clinical Decision Making Limited treatment options, no task modification necessary   ? Comorbidities Affecting Occupational Performance: May have comorbidities impacting occupational performance   ? Modification or Assistance to Complete Evaluation  No modification of tasks or assist necessary to complete eval   ? OT Frequency 1x / week   ? OT Duration 6 weeks   ? OT Treatment/Interventions Self-care/ADL training;Aquatic Therapy;Moist Heat;Cryotherapy;Ultrasound;Therapeutic exercise;Neuromuscular education;Energy conservation;DME and/or AE instruction;Therapist, nutritional;Therapeutic activities;Cognitive remediation/compensation;Patient/family education;Balance training   ? Plan D/C from OT   ? Consulted and Agree with Plan of Care Patient   ? ?  ?  ? ?  ? ? ?Patient will benefit from skilled therapeutic intervention in order to improve the following deficits and impairments:   ?Body Structure / Function / Physical Skills: ADL, Balance, Coordination, Decreased knowledge of precautions, Decreased knowledge of use of DME, Endurance, FMC, GMC, IADL, Mobility, Pain, Strength, UE functional use ?  ?  ? ? ?Visit Diagnosis: ?Muscle weakness (generalized) ? ?Unsteadiness on feet ? ?Other lack of coordination ? ?Other abnormalities of gait and mobility ? ? ? ?Problem  List ?Patient Active Problem List  ? Diagnosis Date Noted  ? History of optic neuritis 07/29/2020  ? 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 High risk medication use 01/28/2020  ? Seizure disorder ([place]

## 2021-05-03 NOTE — Therapy (Addendum)
[facility] [address] [place], [place], [address] Phone: [phone]   Fax:  [phone]  Physical Therapy Treatment/Discharge Summary  Patient Details  Name: Miguel Black MRN: [id number] Date of Birth: 04-07-1990 Referring Provider (PT): Despina Arias, MD   PHYSICAL THERAPY DISCHARGE SUMMARY  Visits from Start of Care: 10  Current functional level related to goals / functional outcomes:  PT Long Term Goals - 05/03/21 1346       PT LONG TERM GOAL #1   Title Pt will be independent with progression of HEP for improved strength, balance, transfers, and gait.    Time 6    Period Weeks    Status Achieved    Target Date 05/05/21      PT LONG TERM GOAL #2   Title Demo restoration of gait speed to 2.2 ft/sec to improve safety and efficiency of gait    Baseline 1.56 ft/sec> 2 ft/sec    Time 6    Period Weeks    Status Partially Met    Target Date 05/05/21      PT LONG TERM GOAL #3   Title Pt will improve Berg score to at least 43/56 to decrease fall risk.    Baseline 32/56>40/56>42/56 05/03/21    Time 6    Period Weeks    Status Partially Met    Target Date 05/05/21               Remaining deficits: Balance, decreased strength, gait   Education / Equipment: Educated in HEP, to use cane and AFO for optimal stability with gait.   Patient agrees to discharge. Patient goals were partially met. Patient is being discharged due to being pleased with the current functional level.  Lonia Blood, PT 05/03/21 5:26 PM Phone: [phone] Fax: [phone]    Encounter Date: 05/03/2021   PT End of Session - 05/03/21 1718     Visit Number 10    Number of Visits 12    Date for PT Re-Evaluation 05/05/21    Authorization Type [employer] Medicare/Medicaid (check visit diagnosis list)    Authorization Time Period [facility] Approved 12 PT Visits 03/24/2021-05/24/2021    Authorization - Visit Number 10    Authorization -  Number of Visits 12    Progress Note Due on Visit 10    PT Start Time 1319    PT Stop Time 1400    PT Time Calculation (min) 41 min    Equipment Utilized During Treatment Gait belt    Activity Tolerance Patient tolerated treatment well    Behavior During Therapy WFL for tasks assessed/performed             Past Medical History:  Diagnosis Date   MS (multiple sclerosis) (HCC)    Seizure (HCC)    Status epilepticus (HCC) 07/12/2018    Past Surgical History:  Procedure Laterality Date   NO PAST SURGERIES      There were no vitals filed for this visit.   Subjective Assessment - 05/03/21 1323     Subjective Feel like I'm moving better, walking a little faster, using the AFO more.    Pertinent History dx of MS June 2015 (relapsing/remitting), seizure hx    Patient Stated Goals Pt's goals for therapy are to strengthen L side and get back up to speed    Currently in Pain? No/denies                [facility] PT Assessment -  05/03/21 0001       Berg Balance Test   Sit to Stand Able to stand without using hands and stabilize independently    Standing Unsupported Able to stand safely 2 minutes    Sitting with Back Unsupported but Feet Supported on Floor or Stool Able to sit safely and securely 2 minutes    Stand to Sit Sits safely with minimal use of hands    Transfers Able to transfer safely, definite need of hands    Standing Unsupported with Eyes Closed Able to stand 10 seconds safely    Standing Unsupported with Feet Together Able to place feet together independently and stand 1 minute safely    From Standing, Reach Forward with Outstretched Arm Can reach forward >12 cm safely (5")    From Standing Position, Pick up Object from Floor Able to pick up shoe safely and easily    From Standing Position, Turn to Look Behind Over each Shoulder Looks behind from both sides and weight shifts well    Turn 360 Degrees Able to turn 360 degrees safely but slowly    Standing  Unsupported, Alternately Place Feet on Step/Stool Needs assistance to keep from falling or unable to try    Standing Unsupported, One Foot in Front Able to take small step independently and hold 30 seconds    Standing on One Leg Unable to try or needs assist to prevent fall    Total Score 42    Berg comment: Improved from 40/56                 REview of full HEP, with pt return demo understanding.  Cues to perform standing rotation on foam at home without foam if needed (he reports not finding foam as thick at home).          [facility] Adult PT Treatment/Exercise - 05/03/21 0001       Ambulation/Gait   Ambulation/Gait Yes    Ambulation/Gait Assistance 4: Min guard    Ambulation Distance (Feet) 60 Feet   120   Assistive device None    Gait Pattern Step-through pattern;Decreased step length - left;Decreased hip/knee flexion - left;Decreased dorsiflexion - left;Ataxic;Poor foot clearance - left;Left genu recurvatum    Gait velocity 16.37 sec = 2 ft/sec      Lumbar Exercises: Aerobic   Nustep level 5 x 7 min to improve activity tolerance, cues to maintain at ~80SPM                     PT Education - 05/03/21 1718     Education Details Progress towards goals, POC/discharge this visit    Person(s) Educated Patient    Methods Explanation    Comprehension Verbalized understanding              PT Short Term Goals - 04/28/21 1331       PT SHORT TERM GOAL #1   Title Pt will be independent with HEP for improved strength, balance, transfers, and gait.    Time 3    Period Weeks    Status Partially Met    Target Date 04/14/21      PT SHORT TERM GOAL #2   Title Pt will improve 5x sit<>stand to less than or equal to 20 seconds to demonstrate improved functional strength and transfer efficiency.    Baseline unable to perform, requires UE support; 17.85 sec 04/21/21    Time 3    Period Weeks    Status  Achieved    Target Date 04/14/21      PT SHORT TERM GOAL  #3   Title Demonstrate improved balance as evidenced by score 35/56 Berg Balance Test    Baseline 32/56; 40/56 04/21/2021    Time 3    Period Weeks    Status Achieved    Target Date 04/14/21               PT Long Term Goals - 05/03/21 1346       PT LONG TERM GOAL #1   Title Pt will be independent with progression of HEP for improved strength, balance, transfers, and gait.    Time 6    Period Weeks    Status Achieved    Target Date 05/05/21      PT LONG TERM GOAL #2   Title Demo restoration of gait speed to 2.2 ft/sec to improve safety and efficiency of gait    Baseline 1.56 ft/sec> 2 ft/sec    Time 6    Period Weeks    Status Partially Met    Target Date 05/05/21      PT LONG TERM GOAL #3   Title Pt will improve Berg score to at least 43/56 to decrease fall risk.    Baseline 32/56>40/56>42/56 05/03/21    Time 6    Period Weeks    Status Partially Met    Target Date 05/05/21                   Plan - 05/03/21 1719     Clinical Impression Statement 10th Visit Note/Discharge note, covering dates 03/24/2021-05/03/2021.  Assessed LTGs this visit, as pt feels he has made improvements in functional strength, balance and would like to work on his own for the upcoming future.  Pt has met 1 of 3 LTGs.  LTG 1 met for HEP.  LTG 2 partially met for improved gait velocity to 2 ft/sec (from 1.56 ft/sec), just not to goal level.  LTG 3 partially met, for imrpoved Berg score to 42/56, just not to goal level.  Pt reports wearing AFO most of the time, and PT reinforced education to use cane and wear AFO for optimal stability and safety with gait.  Pt is appropriate for d/c at this time.    PT Frequency 2x / week    PT Duration 6 weeks    PT Treatment/Interventions ADLs/Self Care Home Management;Gait training;Stair training;Functional mobility training;Therapeutic activities;Therapeutic exercise;Balance training;Neuromuscular re-education;DME Instruction;Manual techniques;Orthotic  Fit/Training;Patient/family education;Energy conservation;Electrical Stimulation    PT Next Visit Plan Discharge this visit.    PT Home Exercise Plan walking with walker and AFO due to fall risk    Consulted and Agree with Plan of Care Patient             Patient will benefit from skilled therapeutic intervention in order to improve the following deficits and impairments:  Abnormal gait, Decreased coordination, Difficulty walking, Impaired tone, Decreased balance, Postural dysfunction, Decreased strength, Decreased mobility  Visit Diagnosis: Muscle weakness (generalized)  Other abnormalities of gait and mobility  Unsteadiness on feet     Problem List Patient Active Problem List   Diagnosis Date Noted   History of optic neuritis 07/29/2020   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 [place], [place], [address] Phone: [phone]   Fax:  [phone]  Name: Miguel Black MRN: [id number] Date of Birth: May 18, 1990

## 2021-05-05 ENCOUNTER — Ambulatory Visit: Payer: Medicare (Managed Care) | Admitting: Rehabilitative and Restorative Service Providers"

## 2021-06-03 ENCOUNTER — Other Ambulatory Visit: Payer: Self-pay | Admitting: Neurology

## 2021-06-03 DIAGNOSIS — G35 Multiple sclerosis: Secondary | ICD-10-CM

## 2021-06-24 ENCOUNTER — Ambulatory Visit (INDEPENDENT_AMBULATORY_CARE_PROVIDER_SITE_OTHER): Payer: Medicare (Managed Care) | Admitting: Neurology

## 2021-06-24 ENCOUNTER — Encounter: Payer: Self-pay | Admitting: Neurology

## 2021-06-24 VITALS — BP 122/79 | HR 65 | Ht 69.0 in | Wt 153.0 lb

## 2021-06-24 DIAGNOSIS — G40909 Epilepsy, unspecified, not intractable, without status epilepticus: Secondary | ICD-10-CM

## 2021-06-24 DIAGNOSIS — R5383 Other fatigue: Secondary | ICD-10-CM

## 2021-06-24 DIAGNOSIS — H5121 Internuclear ophthalmoplegia, right eye: Secondary | ICD-10-CM

## 2021-06-24 DIAGNOSIS — R269 Unspecified abnormalities of gait and mobility: Secondary | ICD-10-CM | POA: Diagnosis not present

## 2021-06-24 DIAGNOSIS — Z8669 Personal history of other diseases of the nervous system and sense organs: Secondary | ICD-10-CM

## 2021-06-24 DIAGNOSIS — G35 Multiple sclerosis: Secondary | ICD-10-CM | POA: Diagnosis not present

## 2021-06-24 MED ORDER — PREDNISONE 50 MG PO TABS: ORAL_TABLET | ORAL | 0 refills | Status: DC

## 2021-06-24 NOTE — Progress Notes (Signed)
Took call from [facility] and spoke w/ Tammy. Confirmed high dose steroid for MS exacerbation. Dx code: G56. She verbalized understanding.

## 2021-06-24 NOTE — Progress Notes (Signed)
[facility]  PATIENT: Miguel Black DOB: 06-Oct-1990  REFERRING DOCTOR OR PCP:  Cain Saupe (PCP) SOURCE: Patient, notes from emergency room, lab results and imaging studies personally reviewed.  _________________________________   HISTORICAL  CHIEF COMPLAINT:  Chief Complaint  Patient presents with   Follow-up    Pt alone, rm 1. He had a seizure in feb 2023. Since the last sz he has been weaker ever since. He was working with Pt/OT which completed in April. Last Ocrevus infusion was 05/24/2021 and next one is scheduled 11/22/2021.     HISTORY OF PRESENT ILLNESS:  He is a 31 y.o. man with multiple sclerosis and seizures.  Update 07/29/2020: He reports doing well since his last visit.  He reports no new seizures and feels his neurologic status is stable with no new symptoms or exacerbations.    He had a seizure 03/07/2021 that became status epilepticus.  At the time he was on dalfampridine x 1 week and does not think it helped much.    He had GTC.   He remembers the part of  the seizure (more likel the post ictal state) and feeling like being transported to a different dimension ad dreamlike state.  He was intubated and in ICU x 2 days.  He also had Covid at the time.  He still feels weaker than in February.    He was on levetiracetam at the time.   His previous seizure was 12/03/2019.  Afterwards, he remained groggy and slept 11 hours.   He returned to his baseline over the next day.     He has had 4 seizures overall 03/07/2016 was first  He feels his MS is stable.     He is on Ocrevus, last dose May 2023.   Next one 11/22/2021.   He feels neurologically stable.   No exacerbations or new neurologic symptoms.    He uses a cane to ambulate due to balace more than strength.   He can go across the room without a cane and 150-200 feet with it.  Marland Kitchen  He notes some slowing of the left hand and it feels heavier - unchanged.  No recent falls.  He is weaker on his left side than  right.  Bladder function is fine.   He notes vision is reduced on the left and he has color desaturation out of the left eye.   His right eye has good vision.      He notes fatigue some afternoons but is good in the mornings.   Amantadine had not helped much.  Fatigue is physical > cognitive.   He sleeps well.    He feels focus and attention are good though cognitive skills sometimes are a little off.   He feels mood is fine.        MS history:  He was diagnosed with relapsing remitting MS in June 2015 after presenting with reduced vision OS while he was in [facility] [place].   In retrospect he had had some neurologic symptoms a few years before the disgnosis.   He had trouble running before his diagnosis.    He was placed on Tecfidera but he had breakthrough activity.     Just a year later, he was switched to Tysabri and has done better.  He moved to [facility]  June 2019 and started seeing the [facility] at [facility] (Dr. Gaynelle Adu).   He has not had any exacerbations and feels his MS has been  stable.   Initially, he continue Tysabri.  He tolerated Tysabri well but going back and forth to an infusion center monthly with difficulty.    He has been JCV Ab negative.   His last infusion was off Tysabri was in summer 2019 and he switched to Ocrevus at the end of 2019.   His last JCV Ab test I have a result for is 07/04/2017 (negative 0.14).   I saw him 12/2018 and he transferred infusions to [facility].      Seizure history:  His first one was 2018 and second one was 07/12/2018.   He notes being dehydrated the second one.    Both seizures were GTC by description with LOC.    He slept > a day after each one.   He was started on Keppra but stopped after a week or so due to side effects.  He had a third seizure November 2021 when he was found by his dad in a postictal state.  He was placed on Keppra has continued to take it.   Imaging MRI of the brain 07/13/2018 showed multiple T2/flair hyperintense foci.  Posterior fossa  foci are noted in the posterior right medulla, bilateral cerebellar hemispheres, pons, right cerebellar peduncle and cerebral peduncle.  There are foci in the thalamus and multiple foci in the periventricular, juxtacortical and deep white matter.  None of the foci appear to be acute or enhance.   Lab tests: Hepatitis B labs from 2020 at [facility] showed no evidence of chronic infection.  :  TB Gold was negative 01/22/2019.  Vitamin D 01/22/2019 was 30.1.  IgG/IgA/IgM was normal 01/22/2019.  CD19/CD20 showed less then 0.5% of lymphocytes were B cells.    REVIEW OF SYSTEMS: Constitutional: No fevers, chills, sweats, or change in appetite Eyes: He notes reduced vision OS.  He sometimes has double vision. Ear, nose and throat: No hearing loss, ear pain, nasal congestion, sore throat Cardiovascular: No chest pain, palpitations Respiratory:  No shortness of breath at rest or with exertion.   No wheezes GastrointestinaI: No nausea, vomiting, diarrhea, abdominal pain, fecal incontinence Genitourinary:  No dysuria, urinary retention or frequency.  No nocturia. Musculoskeletal:  No neck pain, back pain Integumentary: No rash, pruritus, skin lesions Neurological: as above Psychiatric: No depression at this time.  No anxiety Endocrine: No palpitations, diaphoresis, change in appetite, change in weigh or increased thirst Hematologic/Lymphatic:  No anemia, purpura, petechiae. Allergic/Immunologic: No itchy/runny eyes, nasal congestion, recent allergic reactions, rashes  ALLERGIES: Allergies  Allergen Reactions   Dalfampridine Other (See Comments)    Seizure 02/2021 while on dalfampridine    HOME MEDICATIONS:  Current Outpatient Medications:    cholecalciferol (VITAMIN D3) 25 MCG (1000 UNIT) tablet, Take 5,000 Units by mouth daily., Disp: , Rfl:    levETIRAcetam (KEPPRA) 1000 MG tablet, Take 1 tablet (1,000 mg total) by mouth 2 (two) times daily., Disp: 60 tablet, Rfl: 1   OCREVUS 300 MG/10ML  injection, INFUSE 600MG  IN 0.9% NS INTRAVENOUSLY AT 40ML\HR AND INCREASE BY 40ML\HR EVERY 30 MINUTES (MAX 200ML\HR) OVER AT LEAST 3.5HRS AS DIRECTED EVERY 6 MONTHS., Disp: 20 mL, Rfl: 1  PAST MEDICAL HISTORY: Past Medical History:  Diagnosis Date   MS (multiple sclerosis) (HCC)    Seizure (HCC)    Status epilepticus (HCC) 07/12/2018    PAST SURGICAL HISTORY: Past Surgical History:  Procedure Laterality Date   NO PAST SURGERIES      FAMILY HISTORY: Family History  Problem Relation Age of Onset  Hypertension Mother    Healthy Father    Healthy Sister    Healthy Brother    Diabetes Maternal Grandmother    Hypertension Maternal Grandfather    Hodgkin's lymphoma Brother    Colon cancer Maternal Uncle 45    SOCIAL HISTORY:  Social History   Socioeconomic History   Marital status: Unknown    Spouse name: Not on file   Number of children: 0   Years of education: 11   Highest education level: Not on file  Occupational History   Occupation: Unemployed  Tobacco Use   Smoking status: Some Days    Types: Cigars   Smokeless tobacco: Never   Tobacco comments:    2.5-3 per day  Vaping Use   Vaping Use: Never used  Substance and Sexual Activity   Alcohol use: Yes    Comment: sometimes    Drug use: Yes    Types: Marijuana    Comment: Reports he smokes a lot, cannot put a number to it   Sexual activity: Not on file  Other Topics Concern   Not on file  Social History Narrative   Left handed   Coffee every day   Lives with family    Social Determinants of Health   Financial Resource Strain: Not on file  Food Insecurity: Not on file  Transportation Needs: Not on file  Physical Activity: Not on file  Stress: Not on file  Social Connections: Not on file  Intimate Partner Violence: Not on file     PHYSICAL EXAM  Vitals:   06/24/21 0818  BP: 122/79  Pulse: 65  Weight: 153 lb (69.4 kg)  Height: 5\' 9"  (1.753 m)    Body mass index is 22.59  kg/m.   General: The patient is well-developed and well-nourished and in no acute distress  HEENT:  Head is [place]/AT.  Sclera are anicteric.     Skin: Extremities are without rash or edema.  Neurologic Exam  Mental status: The patient is alert and oriented x 3 at the time of the examination. The patient has apparent normal recent and remote memory, with an apparently normal attention span and concentration ability.   Speech is normal.  Cranial nerves: Extraocular movements show right incomplete INO. Pupils are equal, round, and reactive to light and accomodation.  Mild reduced acuity and color saturation OS vs OD.  Facial strength was normal.  There is good facial sensation to soft touch bilaterally. Trapezius and sternocleidomastoid strength is normal. No dysarthria is noted.   No obvious hearing deficits are noted.  Motor:  Muscle bulk is normal.   Tone is increased in legs. Strength is  5 / 5 in all 4 extremities.   Sensory: Sensory testing is intact to pinprick, soft touch and vibration sensation in all 4 extremities.  Coordination: Cerebellar testing reveals reduced finger-nose-finger and heel-to-shin lon he left.  Gait and station: Station is normal.   He has a left foot drop and the gait is spastic. He is unable to tandem.  Romberg is negative.   Reflexes: Deep tendon reflexes are increased in legs, left > right; crossed adductors (L>R) and nonsustained left ankle clonus.      DIAGNOSTIC DATA (LABS, IMAGING, TESTING) - I reviewed patient records, labs, notes, testing and imaging myself where available.  Lab Results  Component Value Date   WBC 11.4 (H) 03/10/2021   HGB 13.6 03/10/2021   HCT 41.1 03/10/2021   MCV 95.8 03/10/2021   PLT 185 03/10/2021  Component Value Date/Time   NA 139 03/10/2021 0611   NA 140 07/22/2019 1502   K 3.5 03/10/2021 0611   CL 108 03/10/2021 0611   CO2 19 (L) 03/10/2021 0611   GLUCOSE 72 03/10/2021 0611   BUN <5 (L) 03/10/2021 0611    BUN 12 07/22/2019 1502   CREATININE 0.91 03/10/2021 0611   CALCIUM 8.3 (L) 03/10/2021 0611   PROT 7.7 03/07/2021 0930   PROT 7.2 07/22/2019 1502   ALBUMIN 4.6 03/07/2021 0930   ALBUMIN 4.9 07/22/2019 1502   AST 27 03/07/2021 0930   ALT 15 03/07/2021 0930   ALKPHOS 68 03/07/2021 0930   BILITOT 0.8 03/07/2021 0930   BILITOT <0.2 07/22/2019 1502   GFRNONAA >60 03/10/2021 0611   GFRAA 99 07/22/2019 1502     Lab Results  Component Value Date   TSH 0.625 01/22/2019       ASSESSMENT AND PLAN  Relapsing remitting multiple sclerosis (HCC)  Seizure disorder (HCC)  Gait disturbance  Other fatigue  History of optic neuritis  Internuclear ophthalmoplegia of right eye   1.  With MS, he will continue Ocrevus.  At next visit:  checkk IgM/IgG/IgA and CBC with differential today.  Check MRI of the brain and cervical spine aroud time of next visit to determine if there is any breakthrough activity.  If this is occurring we would need to consider a different disease modifying therapy 2.  Seizure likely combination of MS, history of sz, dalfampridine and Covid infection.   Continue Keppra 1000 mg p.o. twice daily.    Avoid dalfampridine (also avoid tramadol and Wellbutrin). 3.   We will continue to use the cane.  We did discuss the possibility of using a walker for more stability.  Avoid dalfampridine.  Stay active, exercise as tolerated. 4.   Due to moderate fatigue - high dose prednisone (600 mg x 3 d)  5.   Return to see [place] in 6 months or sooner for new or worsening neurologic symptoms.  42 minute office visit with the majority of the time spent face-to-face for history and physical, discussion/counseling and decision-making.  Additional time with record review and documentation.    Saisha Hogue A. Epimenio Foot, MD, [facility] 06/24/2021, 9:02 AM Certified in Neurology, Clinical Neurophysiology, Sleep Medicine and Neuroimaging  [facility] [address], [address] [place],  [place] [address] [phone]

## 2021-08-12 ENCOUNTER — Ambulatory Visit: Payer: Medicare (Managed Care) | Admitting: Neurology

## 2021-08-20 ENCOUNTER — Telehealth: Payer: Self-pay | Admitting: Neurology

## 2021-08-20 NOTE — Telephone Encounter (Signed)
Pt requesting refill for levETIRAcetam (KEPPRA) 1000 MG tablet at [facility] [phone]

## 2021-08-21 ENCOUNTER — Other Ambulatory Visit: Payer: Self-pay

## 2021-08-21 ENCOUNTER — Emergency Department (HOSPITAL_COMMUNITY): Payer: Medicare (Managed Care)

## 2021-08-21 ENCOUNTER — Encounter (HOSPITAL_COMMUNITY): Payer: Self-pay | Admitting: Pulmonary Disease

## 2021-08-21 ENCOUNTER — Inpatient Hospital Stay (HOSPITAL_COMMUNITY): Payer: Medicare (Managed Care)

## 2021-08-21 ENCOUNTER — Inpatient Hospital Stay (HOSPITAL_COMMUNITY)
Admission: EM | Admit: 2021-08-21 | Discharge: 2021-08-27 | DRG: 100 | Disposition: A | Discharge status: Home Health | Payer: Medicare (Managed Care) | Attending: Internal Medicine | Admitting: Internal Medicine

## 2021-08-21 DIAGNOSIS — G35 Multiple sclerosis: Secondary | ICD-10-CM | POA: Diagnosis present

## 2021-08-21 DIAGNOSIS — F17290 Nicotine dependence, other tobacco product, uncomplicated: Secondary | ICD-10-CM | POA: Diagnosis present

## 2021-08-21 DIAGNOSIS — E876 Hypokalemia: Secondary | ICD-10-CM | POA: Diagnosis not present

## 2021-08-21 DIAGNOSIS — J9601 Acute respiratory failure with hypoxia: Secondary | ICD-10-CM | POA: Diagnosis present

## 2021-08-21 DIAGNOSIS — G40909 Epilepsy, unspecified, not intractable, without status epilepticus: Secondary | ICD-10-CM

## 2021-08-21 DIAGNOSIS — Z888 Allergy status to other drugs, medicaments and biological substances status: Secondary | ICD-10-CM | POA: Diagnosis not present

## 2021-08-21 DIAGNOSIS — G40901 Epilepsy, unspecified, not intractable, with status epilepticus: Secondary | ICD-10-CM | POA: Diagnosis present

## 2021-08-21 DIAGNOSIS — J690 Pneumonitis due to inhalation of food and vomit: Secondary | ICD-10-CM | POA: Diagnosis present

## 2021-08-21 DIAGNOSIS — Z79899 Other long term (current) drug therapy: Secondary | ICD-10-CM

## 2021-08-21 DIAGNOSIS — R269 Unspecified abnormalities of gait and mobility: Secondary | ICD-10-CM

## 2021-08-21 DIAGNOSIS — R32 Unspecified urinary incontinence: Secondary | ICD-10-CM | POA: Diagnosis present

## 2021-08-21 LAB — CBC WITH DIFFERENTIAL/PLATELET
Abs Immature Granulocytes: 0.13 10*3/uL — ABNORMAL HIGH (ref 0.00–0.07)
Basophils Absolute: 0 10*3/uL (ref 0.0–0.1)
Basophils Relative: 0 %
Eosinophils Absolute: 0 10*3/uL (ref 0.0–0.5)
Eosinophils Relative: 0 %
HCT: 44.7 % (ref 39.0–52.0)
Hemoglobin: 14.8 g/dL (ref 13.0–17.0)
Immature Granulocytes: 1 %
Lymphocytes Relative: 10 %
Lymphs Abs: 2 10*3/uL (ref 0.7–4.0)
MCH: 32.2 pg (ref 26.0–34.0)
MCHC: 33.1 g/dL (ref 30.0–36.0)
MCV: 97.2 fL (ref 80.0–100.0)
Monocytes Absolute: 0.7 10*3/uL (ref 0.1–1.0)
Monocytes Relative: 4 %
Neutro Abs: 17.3 10*3/uL — ABNORMAL HIGH (ref 1.7–7.7)
Neutrophils Relative %: 85 %
Platelets: 251 10*3/uL (ref 150–400)
RBC: 4.6 MIL/uL (ref 4.22–5.81)
RDW: 13.1 % (ref 11.5–15.5)
WBC: 20.2 10*3/uL — ABNORMAL HIGH (ref 4.0–10.5)
nRBC: 0 % (ref 0.0–0.2)

## 2021-08-21 LAB — URINALYSIS, ROUTINE W REFLEX MICROSCOPIC
Bacteria, UA: NONE SEEN
Bilirubin Urine: NEGATIVE
Glucose, UA: NEGATIVE mg/dL
Ketones, ur: NEGATIVE mg/dL
Leukocytes,Ua: NEGATIVE
Nitrite: NEGATIVE
Protein, ur: 30 mg/dL — AB
Specific Gravity, Urine: 1.017 (ref 1.005–1.030)
pH: 5 (ref 5.0–8.0)

## 2021-08-21 LAB — POCT I-STAT 7, (LYTES, BLD GAS, ICA,H+H)
Acid-base deficit: 4 mmol/L — ABNORMAL HIGH (ref 0.0–2.0)
Bicarbonate: 21.5 mmol/L (ref 20.0–28.0)
Calcium, Ion: 1.05 mmol/L — ABNORMAL LOW (ref 1.15–1.40)
HCT: 39 % (ref 39.0–52.0)
Hemoglobin: 13.3 g/dL (ref 13.0–17.0)
O2 Saturation: 99 %
Patient temperature: 101.5
Potassium: 3.5 mmol/L (ref 3.5–5.1)
Sodium: 141 mmol/L (ref 135–145)
TCO2: 23 mmol/L (ref 22–32)
pCO2 arterial: 43 mmHg (ref 32–48)
pH, Arterial: 7.314 — ABNORMAL LOW (ref 7.35–7.45)
pO2, Arterial: 163 mmHg — ABNORMAL HIGH (ref 83–108)

## 2021-08-21 LAB — COMPREHENSIVE METABOLIC PANEL
ALT: 12 U/L (ref 0–44)
AST: 20 U/L (ref 15–41)
Albumin: 4.5 g/dL (ref 3.5–5.0)
Alkaline Phosphatase: 59 U/L (ref 38–126)
Anion gap: 8 (ref 5–15)
BUN: 13 mg/dL (ref 6–20)
CO2: 24 mmol/L (ref 22–32)
Calcium: 8.5 mg/dL — ABNORMAL LOW (ref 8.9–10.3)
Chloride: 109 mmol/L (ref 98–111)
Creatinine, Ser: 1.07 mg/dL (ref 0.61–1.24)
GFR, Estimated: >60 mL/min (ref >60)
Glucose, Bld: 153 mg/dL — ABNORMAL HIGH (ref 70–99)
Potassium: 5 mmol/L (ref 3.5–5.1)
Sodium: 141 mmol/L (ref 135–145)
Total Bilirubin: 0.5 mg/dL (ref 0.3–1.2)
Total Protein: 7.3 g/dL (ref 6.5–8.1)

## 2021-08-21 LAB — RAPID URINE DRUG SCREEN, HOSP PERFORMED
Amphetamines: NOT DETECTED
Barbiturates: NOT DETECTED
Benzodiazepines: NOT DETECTED
Cocaine: NOT DETECTED
Opiates: NOT DETECTED
Tetrahydrocannabinol: POSITIVE — AB

## 2021-08-21 LAB — ETHANOL: Alcohol, Ethyl (B): <10 mg/dL (ref <10)

## 2021-08-21 LAB — MRSA NEXT GEN BY PCR, NASAL: MRSA by PCR Next Gen: NOT DETECTED

## 2021-08-21 LAB — CBG MONITORING, ED: Glucose-Capillary: 168 mg/dL — ABNORMAL HIGH (ref 70–99)

## 2021-08-21 LAB — MAGNESIUM: Magnesium: 2 mg/dL (ref 1.7–2.4)

## 2021-08-21 MED ORDER — PROPOFOL 1000 MG/100ML IV EMUL
5.0000 ug/kg/min | INTRAVENOUS | Status: DC
  Administered 2021-08-21 – 2021-08-23 (×5): 50 ug/kg/min via INTRAVENOUS
  Filled 2021-08-21 (×6): qty 100

## 2021-08-21 MED ORDER — MIDAZOLAM-SODIUM CHLORIDE 100-0.9 MG/100ML-% IV SOLN
0.0000 mg/h | INTRAVENOUS | Status: DC
  Administered 2021-08-21: 2 mg/h via INTRAVENOUS
  Filled 2021-08-21: qty 100

## 2021-08-21 MED ORDER — HEPARIN SODIUM (PORCINE) 5000 UNIT/ML IJ SOLN
5000.0000 [IU] | Freq: Three times a day (TID) | INTRAMUSCULAR | Status: DC
Start: 2021-08-21 — End: 2021-08-27
  Administered 2021-08-21 – 2021-08-27 (×19): 5000 [IU] via SUBCUTANEOUS
  Filled 2021-08-21 (×18): qty 1

## 2021-08-21 MED ORDER — ORAL CARE MOUTH RINSE: 15.0000 mL | OROMUCOSAL | Status: DC | PRN

## 2021-08-21 MED ORDER — ACETAMINOPHEN 325 MG PO TABS
ORAL_TABLET | ORAL | Status: AC
  Filled 2021-08-21: qty 2

## 2021-08-21 MED ORDER — LORAZEPAM 2 MG/ML IJ SOLN
INTRAMUSCULAR | Status: AC
  Administered 2021-08-21: 2 mg via INTRAVENOUS
  Filled 2021-08-21: qty 1

## 2021-08-21 MED ORDER — SODIUM CHLORIDE 0.9 % IV SOLN: INTRAVENOUS | Status: DC

## 2021-08-21 MED ORDER — LORAZEPAM 2 MG/ML IJ SOLN
2.0000 mg | Freq: Once | INTRAMUSCULAR | Status: AC
  Administered 2021-08-21: 2 mg via INTRAVENOUS

## 2021-08-21 MED ORDER — LEVETIRACETAM IN NACL 1000 MG/100ML IV SOLN
2000.0000 mg | Freq: Once | INTRAVENOUS | Status: AC
  Administered 2021-08-21: 2000 mg via INTRAVENOUS

## 2021-08-21 MED ORDER — DOCUSATE SODIUM 100 MG PO CAPS: 100.0000 mg | ORAL_CAPSULE | Freq: Two times a day (BID) | ORAL | Status: DC | PRN

## 2021-08-21 MED ORDER — SODIUM CHLORIDE 0.9 % IV SOLN
2000.0000 mg | Freq: Once | INTRAVENOUS | Status: DC
  Administered 2021-08-21: 2000 mg via INTRAVENOUS
  Filled 2021-08-21: qty 20

## 2021-08-21 MED ORDER — SODIUM CHLORIDE 0.9 % IV BOLUS
1000.0000 mL | Freq: Once | INTRAVENOUS | Status: AC
  Administered 2021-08-21: 1000 mL via INTRAVENOUS

## 2021-08-21 MED ORDER — AMPICILLIN-SULBACTAM SODIUM 3 (2-1) G IJ SOLR
3.0000 g | Freq: Four times a day (QID) | INTRAMUSCULAR | Status: DC
  Administered 2021-08-21 – 2021-08-24 (×11): 3 g via INTRAVENOUS
  Filled 2021-08-21 (×10): qty 8

## 2021-08-21 MED ORDER — LORAZEPAM 2 MG/ML IJ SOLN
2.0000 mg | Freq: Once | INTRAMUSCULAR | Status: AC
  Filled 2021-08-21: qty 1

## 2021-08-21 MED ORDER — MIDAZOLAM BOLUS VIA INFUSION: 0.0000 mg | INTRAVENOUS | Status: DC | PRN

## 2021-08-21 MED ORDER — CHLORHEXIDINE GLUCONATE CLOTH 2 % EX PADS
6.0000 | MEDICATED_PAD | Freq: Every day | CUTANEOUS | Status: DC
  Administered 2021-08-22 – 2021-08-27 (×7): 6 via TOPICAL

## 2021-08-21 MED ORDER — PROPOFOL 1000 MG/100ML IV EMUL
INTRAVENOUS | Status: AC
  Administered 2021-08-21: 50 ug/kg/min
  Filled 2021-08-21: qty 100

## 2021-08-21 MED ORDER — ORAL CARE MOUTH RINSE
15.0000 mL | OROMUCOSAL | Status: DC
  Administered 2021-08-21 – 2021-08-24 (×32): 15 mL via OROMUCOSAL

## 2021-08-21 MED ORDER — SUCCINYLCHOLINE CHLORIDE 20 MG/ML IJ SOLN
INTRAMUSCULAR | Status: AC | PRN
  Administered 2021-08-21: 100 mg via INTRAVENOUS

## 2021-08-21 MED ORDER — LEVETIRACETAM IN NACL 1000 MG/100ML IV SOLN
1000.0000 mg | Freq: Two times a day (BID) | INTRAVENOUS | Status: DC
Start: 2021-08-21 — End: 2021-08-24
  Administered 2021-08-21 – 2021-08-24 (×6): 1000 mg via INTRAVENOUS
  Filled 2021-08-21 (×6): qty 100

## 2021-08-21 MED ORDER — POLYETHYLENE GLYCOL 3350 17 G PO PACK
17.0000 g | PACK | Freq: Every day | ORAL | Status: DC | PRN
  Filled 2021-08-21: qty 1

## 2021-08-21 MED ORDER — PANTOPRAZOLE 2 MG/ML SUSPENSION
40.0000 mg | Freq: Every day | ORAL | Status: DC
  Administered 2021-08-21 – 2021-08-24 (×4): 40 mg
  Filled 2021-08-21 (×4): qty 20

## 2021-08-21 MED ORDER — DOCUSATE SODIUM 50 MG/5ML PO LIQD
100.0000 mg | Freq: Two times a day (BID) | ORAL | Status: DC
Start: 2021-08-21 — End: 2021-08-24
  Administered 2021-08-21 – 2021-08-23 (×5): 100 mg
  Filled 2021-08-21 (×5): qty 10

## 2021-08-21 MED ORDER — ACETAMINOPHEN 325 MG PO TABS
650.0000 mg | ORAL_TABLET | Freq: Four times a day (QID) | ORAL | Status: DC | PRN
  Administered 2021-08-21 – 2021-08-24 (×4): 650 mg
  Filled 2021-08-21 (×4): qty 2

## 2021-08-21 MED ORDER — ACETAMINOPHEN 325 MG PO TABS: 650.0000 mg | ORAL_TABLET | Freq: Four times a day (QID) | ORAL | Status: DC | PRN

## 2021-08-21 MED ORDER — PROPOFOL 1000 MG/100ML IV EMUL
INTRAVENOUS | Status: AC
  Filled 2021-08-21: qty 100

## 2021-08-21 MED ORDER — LEVETIRACETAM IN NACL 1000 MG/100ML IV SOLN
2000.0000 mg | Freq: Once | INTRAVENOUS | Status: DC
  Filled 2021-08-21: qty 200

## 2021-08-21 MED ORDER — POLYETHYLENE GLYCOL 3350 17 G PO PACK
17.0000 g | PACK | Freq: Every day | ORAL | Status: DC
  Administered 2021-08-21 – 2021-08-23 (×3): 17 g
  Filled 2021-08-21 (×3): qty 1

## 2021-08-21 MED ORDER — ETOMIDATE 2 MG/ML IV SOLN
INTRAVENOUS | Status: AC | PRN
  Administered 2021-08-21: 20 mg via INTRAVENOUS

## 2021-08-21 NOTE — Progress Notes (Signed)
LTM EEG hooked up and running - no initial skin breakdown - push button tested - neuro notified. [facility] monitoring.  

## 2021-08-21 NOTE — Progress Notes (Signed)
Pharmacy Antibiotic Note  Miguel Black is a 31 y.o. male for which pharmacy has been consulted for unasyn dosing for  aspiration pneumonia . Patient presenting with seizures. Patient is intubated.  Estimated Creatinine Clearance: 99.1 mL/min (by C-G formula based on SCr of 1.07 mg/dL). WBC 20.2; T 101; HR 114; RR 18  Plan: Unasyn 3g q6h Trend WBC, Fever, Renal function, & Clinical course F/u cultures, clinical course, WBC, fever De-escalate when able  Height: 5\' 9"  (175.3 cm) Weight: 69.4 kg (153 lb) IBW/kg (Calculated) : 70.7  Temp (24hrs), Avg:100.2 F (37.9 C), Min:98.9 F (37.2 C), Max:101 F (38.3 C)  Recent Labs  Lab 08/21/21 1423  WBC 20.2*  CREATININE 1.07    Estimated Creatinine Clearance: 99.1 mL/min (by C-G formula based on SCr of 1.07 mg/dL).    Allergies  Allergen Reactions   Dalfampridine Other (See Comments)    Seizure 02/2021 while on dalfampridine    Antimicrobials this admission: unasyn 7/29 >>   Microbiology results: Pending  Thank you for allowing pharmacy to be a part of this patient's care.  8/29, PharmD, BCPS 08/21/2021 5:33 PM ED Clinical Pharmacist -  [phone]

## 2021-08-21 NOTE — H&P (Signed)
NAME:  Miguel Black, MRN:  [id number], DOB:  [birth year], LOS: 0 ADMISSION DATE:  08/21/2021, CONSULTATION DATE: 08/21/2021 REFERRING MD: Dr. Janan Halter, CHIEF COMPLAINT: Seizures with concern of status epilepticus  History of Present Illness:  Miguel Black is a 31 year old male with a past medical history of status epilepticus, seizures, and multiple sclerosis who presented to the ED via EMS due to seizures.  Per report family found patient in bed with seizure-like activity prompting EMS call.  Family is unsure length of time patient was seizing prior to being found.  On EMS arrival patient was seen post ictal but on ED arrival patient was again seizing.  Patient family states they have had issues getting maintenance Keppra refilled and has missed approximately 2 days of all antiepileptics.  On arrival to ED patient was intubated for airway protection and received 6 mg of Ativan and Keppra load prior to cessation of seizure-like activity.  Neurology was called and stat EEG ordered.  Blood work relatively unremarkable apart from leukocytosis with WBC 20.2.  Urinalysis normal.  Chest x-ray with patchy infiltrate left lower lung concerning for atelectasis versus possible aspiration pneumonia.  Patient febrile on admission. PCCM consulted for further management and admission.  Pertinent  Medical History  Prior status epilepticus Multiple sclerosis  Significant Hospital Events: Including procedures, antibiotic start and stop dates in addition to other pertinent events   7/29 admitted after being found seizing at home of note patient had missed 2 days of Keppra due to difficulty getting medication refilled.  Intubated in ED for airway protection  Interim History / Subjective:  As above  Objective   Blood pressure (!) 158/99, resp. rate (!) 23, height 5\' 9"  (1.753 m), SpO2 100 %.    Vent Mode: PRVC FiO2 (%):  [40 %] 40 % Set Rate:  [18 bmp] 18 bmp Vt Set:  [570 mL] 570 mL PEEP:  [5  cmH20] 5 cmH20 Plateau Pressure:  [10 cmH20] 10 cmH20   Intake/Output Summary (Last 24 hours) at 08/21/2021 1547 Last data filed at 08/21/2021 1453 Gross per 24 hour  Intake 200 ml  Output --  Net 200 ml   There were no vitals filed for this visit.  Examination: General: young male, intuabted/sedated on mechanical ventilation, in NAD HEENT: ETT, MM pink/moist, PERRL, sclera anicteric Neuro: sedated, PERRL CV: s1s2 regular rate and rhythm, no murmur, rubs, or gallops,  PULM:  clear to auscultation bilaterally, no wheezing GI: soft, bowel sounds active in all 4 quadrants, non-distended Extremities: warm/dry, no edema  Skin: no rashes or lesions  Resolved Hospital Problem list     Assessment & Plan:  Status epilepticus with history of seizures and prior status -likely due to missing anti-epileptic medication vs infection History of multiple sclerosis P: Primary management per neurology  Maintain neuro protective measures; goal for eurothermia, euglycemia, eunatermia, normoxia, and PCO2 goal of 35-40 Nutrition and bowel regiment  Seizure precautions  AEDs per neurology  Aspirations precautions  Frequent neurochecks EEG Consider LP   Acute respiratory insufficiency in the setting of status epilepticus Concern for possible aspiration pneumonia -Chest x-ray with left lower lobe infiltrate in the setting of seizure activity concerning for possible aspiration event.  Leukocytotis and febrile on admission P: Continue ventilator support with lung protective strategies  Wean PEEP and FiO2 for sats greater than 90%. Head of bed elevated 30 degrees. Plateau pressures less than 30 cm H20.  Follow intermittent chest x-ray and ABG.   SAT/SBT as tolerated, mentation preclude  extubation  Ensure adequate pulmonary hygiene  Follow cultures  VAP bundle in place  PAD protocol Empiric Unasyn with low threshold to discontinue Trend WBC and fever curve Check procalcitonin and extended  respiratory viral panel Check urine strep pneumonia ag  Best Practice (right click and "Reselect all SmartList Selections" daily)   Diet/type: NPO DVT prophylaxis: prophylactic heparin  GI prophylaxis: PPI Lines: N/A Foley:  N/A Code Status:  full code Last date of multidisciplinary goals of care discussion [will discuss with family]  Labs   CBC: Recent Labs  Lab 08/21/21 1423  WBC 20.2*  NEUTROABS 17.3*  HGB 14.8  HCT 44.7  MCV 97.2  PLT 251    Basic Metabolic Panel: Recent Labs  Lab 08/21/21 1423  NA 141  K 5.0  CL 109  CO2 24  GLUCOSE 153*  BUN 13  CREATININE 1.07  CALCIUM 8.5*  MG 2.0   GFR: CrCl cannot be calculated (Unknown ideal weight.). Recent Labs  Lab 08/21/21 1423  WBC 20.2*    Liver Function Tests: Recent Labs  Lab 08/21/21 1423  AST 20  ALT 12  ALKPHOS 59  BILITOT 0.5  PROT 7.3  ALBUMIN 4.5   No results for input(s): "LIPASE", "AMYLASE" in the last 168 hours. No results for input(s): "AMMONIA" in the last 168 hours.  ABG    Component Value Date/Time   PHART 7.325 (L) 03/08/2021 1253   PCO2ART 48.5 (H) 03/08/2021 1253   PO2ART 226 (H) 03/08/2021 1253   HCO3 25.2 03/08/2021 1253   TCO2 27 03/08/2021 1253   ACIDBASEDEF 1.0 03/08/2021 1253   O2SAT 100.0 03/08/2021 1253     Coagulation Profile: No results for input(s): "INR", "PROTIME" in the last 168 hours.  Cardiac Enzymes: No results for input(s): "CKTOTAL", "CKMB", "CKMBINDEX", "TROPONINI" in the last 168 hours.  HbA1C: No results found for: "HGBA1C"  CBG: Recent Labs  Lab 08/21/21 1400  GLUCAP 168*    Review of Systems:   Unable to assess review of systems due to being intubation and sedated  Past Medical History:  He,  has a past medical history of MS (multiple sclerosis) (HCC), Seizure (HCC), and Status epilepticus (HCC) (07/12/2018).   Surgical History:   Past Surgical History:  Procedure Laterality Date   NO PAST SURGERIES       Social History:    reports that he has been smoking cigars. He has never used smokeless tobacco. He reports current alcohol use. He reports current drug use. Drug: Marijuana.   Family History:  His family history includes Colon cancer (age of onset: 76) in his maternal uncle; Diabetes in his maternal grandmother; Healthy in his brother, father, and sister; Hodgkin's lymphoma in his brother; Hypertension in his maternal grandfather and mother.   Allergies Allergies  Allergen Reactions   Dalfampridine Other (See Comments)    Seizure 02/2021 while on dalfampridine     Home Medications  Prior to Admission medications   Medication Sig Start Date End Date Taking? Authorizing Provider  cholecalciferol (VITAMIN D3) 25 MCG (1000 UNIT) tablet Take 5,000 Units by mouth daily.    [provider]  levETIRAcetam (KEPPRA) 1000 MG tablet Take 1 tablet (1,000 mg total) by mouth 2 (two) times daily. 03/10/21   Lonia Blood, MD  OCREVUS 300 MG/10ML injection INFUSE 600MG  IN 0.9% NS INTRAVENOUSLY AT 40ML\HR AND INCREASE BY 40ML\HR EVERY 30 MINUTES (MAX 200ML\HR) OVER AT LEAST 3.5HRS AS DIRECTED EVERY 6 MONTHS. 06/07/21   Sater, 06/09/21, MD  predniSONE (DELTASONE) 50 MG tablet 12 pills (600 mg)po qd x 3 days for MS. 06/24/21   Sater, Pearletha Furl, MD     Critical care time: 40 minutes    Melody Comas, MD [facility] Office: [phone]   See Amion for personal pager PCCM on call pager [phone] until 7pm. Please call Elink 7p-7a. [phone]

## 2021-08-21 NOTE — Consult Note (Signed)
Neurology Consultation Reason for Consult: Status Epilepticus Referring Physician: Para Skeans  CC: Status epilepticus  History is obtained from: Chart review  HPI: Miguel Black is a 31 y.o. male with a history of MS and seizures as well as previous episode of status epilepticus who presents in status epilepticus.  I am not sure if he actually ran out, but there is under the chart from yesterday requesting a refill for Keppra.  He was found seizing for an unknown period of time by his mother this morning.  He apparently did arouse and start speaking in route with EMS, but then began seizing again.  On arrival to the emergency department he was actively seizing, at the time of my evaluation he had already received 4 mg of IV Ativan and was still seizing.  At that time he was getting an additional dose of Ativan, and due to airway protection concerns was in the process of being prepped for intubation.   Past Medical History:  Diagnosis Date   MS (multiple sclerosis) (HCC)    Seizure (HCC)    Status epilepticus (HCC) 07/12/2018     Family History  Problem Relation Age of Onset   Hypertension Mother    Healthy Father    Healthy Sister    Healthy Brother    Diabetes Maternal Grandmother    Hypertension Maternal Grandfather    Hodgkin's lymphoma Brother    Colon cancer Maternal Uncle 45     Social History:  reports that he has been smoking cigars. He has never used smokeless tobacco. He reports current alcohol use. He reports current drug use. Drug: Marijuana.   Exam: Current vital signs: BP (!) 158/99   Resp (!) 23  Vital signs in last 24 hours: Resp:  [23] 23 (07/29 1430) BP: (158)/(99) 158/99 (07/29 1430)   Physical Exam  In bed, nonrebreather in place  Neuro: Mental Status: Does not open eyes or follow commands Cranial Nerves: II: Does not but threat. Pupils are equal, round, and reactive to light.   III,IV, VI: No response to doll's, eyes are midline V: VII:  Corneals are intact Motor: He has increased tone, he does flexion posture to noxious stimulation is single time, but mostly just low amplitude clonic activity. Sensory: As above  Cerebellar: Does not perform   I have reviewed labs in epic and the results pertinent to this consultation are: Creatinine 0.9  I have reviewed the images obtained: CT head from 2/12 -periventricular white matter changes  Impression: 31 year old male with a history of MS and epilepsy who presents with recurrent status epilepticus in the setting of difficulty obtaining his medications.   Recommendations: 1) Keppra 1 g twice daily 2) sedatives only for sedation, no need to continue high doses for seizure suppression 3) neurology will follow  This patient is critically ill and at significant risk of neurological worsening, death and care requires constant monitoring of vital signs, hemodynamics,respiratory and cardiac monitoring, neurological assessment, discussion with family, other specialists and medical decision making of high complexity. I spent 50 minutes of neurocritical care time  in the care of  this patient. This was time spent independent of any time provided by nurse practitioner or PA.  Ritta Slot, MD [facility] [phone]  If 7pm- 7am, please page neurology on call as listed in AMION. 08/21/2021  7:49 PM

## 2021-08-21 NOTE — ED Provider Notes (Signed)
[facility] EMERGENCY DEPARTMENT Provider Note   CSN: [id number] Arrival date & time: 08/21/21  1345     History  No chief complaint on file.   Miguel Black is a 31 y.o. male.  Pt is a 31 yo male with a pmhx significant for MS and seizure d/o.  Pt is unable to give any hx.  Family found pt in his bed and appeared to be having a seizure. It is unkn how long he's been seizing. The pt's family called EMS.  EMS said he was postictal when they arrived, but did not appear to be seizing.  He appeared to be seizing when he arrived to the ED.  Pt's family said they have been trying to get his keppra refilled, but have been getting the run around by [facility].  Pt has not taken it in 2 days.         Home Medications Prior to Admission medications   Medication Sig Start Date End Date Taking? Authorizing Provider  cholecalciferol (VITAMIN D3) 25 MCG (1000 UNIT) tablet Take 5,000 Units by mouth daily.    [provider]  levETIRAcetam (KEPPRA) 1000 MG tablet Take 1 tablet (1,000 mg total) by mouth 2 (two) times daily. 03/10/21   Lonia Blood, MD  OCREVUS 300 MG/10ML injection INFUSE 600MG  IN 0.9% NS INTRAVENOUSLY AT 40ML\HR AND INCREASE BY 40ML\HR EVERY 30 MINUTES (MAX 200ML\HR) OVER AT LEAST 3.5HRS AS DIRECTED EVERY 6 MONTHS. 06/07/21   Sater, 06/09/21, MD  predniSONE (DELTASONE) 50 MG tablet 12 pills (600 mg)po qd x 3 days for MS. 06/24/21   Sater, 08/24/21, MD      Allergies    Dalfampridine    Review of Systems   Review of Systems  Unable to perform ROS: Mental status change  All other systems reviewed and are negative.   Physical Exam Updated Vital Signs BP (!) 158/99   Resp (!) 23   Ht 5\' 9"  (1.753 m)   SpO2 100%   BMI 22.59 kg/m  Physical Exam Vitals and nursing note reviewed. Exam conducted with a chaperone present.  Constitutional:      General: He is in acute distress.     Appearance: He is ill-appearing.  HENT:     Head:  Normocephalic.     Comments: Bite to tongue    Nose: Nose normal.     Mouth/Throat:     Mouth: Mucous membranes are dry.  Eyes:     Pupils: Pupils are equal, round, and reactive to light.  Cardiovascular:     Rate and Rhythm: Normal rate and regular rhythm.     Pulses: Normal pulses.     Heart sounds: Normal heart sounds.  Pulmonary:     Effort: Pulmonary effort is normal.     Breath sounds: Normal breath sounds.  Abdominal:     General: Abdomen is flat. Bowel sounds are normal.     Palpations: Abdomen is soft.  Genitourinary:    Penis: Circumcised.      Comments: Incontinent of urine Musculoskeletal:        General: Normal range of motion.     Cervical back: Normal range of motion and neck supple.     Comments: Brace on left leg (suspect hx foot drop?)  Skin:    General: Skin is warm.     Capillary Refill: Capillary refill takes less than 2 seconds.  Neurological:     Comments: Seizing upon arrival  Psychiatric:  Comments: Unable to assess     ED Results / Procedures / Treatments   Labs (all labs ordered are listed, but only abnormal results are displayed) Labs Reviewed  COMPREHENSIVE METABOLIC PANEL - Abnormal; Notable for the following components:      Result Value   Glucose, Bld 153 (*)    Calcium 8.5 (*)    All other components within normal limits  CBC WITH DIFFERENTIAL/PLATELET - Abnormal; Notable for the following components:   WBC 20.2 (*)    Neutro Abs 17.3 (*)    Abs Immature Granulocytes 0.13 (*)    All other components within normal limits  URINALYSIS, ROUTINE W REFLEX MICROSCOPIC - Abnormal; Notable for the following components:   Hgb urine dipstick SMALL (*)    Protein, ur 30 (*)    All other components within normal limits  RAPID URINE DRUG SCREEN, HOSP PERFORMED - Abnormal; Notable for the following components:   Tetrahydrocannabinol POSITIVE (*)    All other components within normal limits  CBG MONITORING, ED - Abnormal; Notable for the  following components:   Glucose-Capillary 168 (*)    All other components within normal limits  MAGNESIUM  ETHANOL    EKG EKG Interpretation  Date/Time:  Saturday August 21 2021 13:53:28 EDT Ventricular Rate:  64 PR Interval:  116 QRS Duration: 94 QT Interval:  427 QTC Calculation: 441 R Axis:   77 Text Interpretation: Sinus arrhythmia Borderline short PR interval No significant change since last tracing Confirmed by Jacalyn Lefevre [phone]) on 08/21/2021 3:19:08 PM  Radiology DG Chest Portable 1 View  Result Date: 08/21/2021 CLINICAL DATA:  Difficulty breathing EXAM: PORTABLE CHEST 1 VIEW COMPARISON:  03/08/2021 FINDINGS: Cardiac size is within normal limits. Tip of endotracheal tube is 4.3 cm above the carina. NG tube is noted traversing the esophagus. Small patchy infiltrates are seen in left lower lung field. Rest of the lung fields are clear. There are no signs of alveolar pulmonary edema. There is no pleural effusion or pneumothorax. IMPRESSION: Small patchy infiltrates in left lower lung fields suggest atelectasis/pneumonia. Electronically Signed   By: Ernie Avena M.D.   On: 08/21/2021 15:24    Procedures Procedure Name: Intubation Date/Time: 08/21/2021 3:23 PM  Performed by: Jacalyn Lefevre, MDPre-anesthesia Checklist: Patient identified, Patient being monitored, Emergency Drugs available, Timeout performed and Suction available Oxygen Delivery Method: Non-rebreather mask Preoxygenation: Pre-oxygenation with 100% oxygen Induction Type: Rapid sequence Ventilation: Mask ventilation without difficulty Tube size: 7.5 mm Number of attempts: 1 Placement Confirmation: ETT inserted through vocal cords under direct vision, CO2 detector and Breath sounds checked- equal and bilateral Secured at: 25 cm Tube secured with: ETT holder Dental Injury: Teeth and Oropharynx as per pre-operative assessment         Medications Ordered in ED Medications  sodium chloride 0.9 %  bolus 1,000 mL (1,000 mLs Intravenous New Bag/Given 08/21/21 1443)    And  0.9 %  sodium chloride infusion (has no administration in time range)  levETIRAcetam (KEPPRA) IVPB 1000 mg/100 mL premix (has no administration in time range)  midazolam (VERSED) 100 mg/100 mL (1 mg/mL) premix infusion (2 mg/hr Intravenous New Bag/Given 08/21/21 1525)  midazolam (VERSED) bolus via infusion 0-5 mg (has no administration in time range)  LORazepam (ATIVAN) injection 2 mg (2 mg Intravenous Given 08/21/21 1415)  propofol (DIPRIVAN) 1000 MG/100ML infusion (50 mcg/kg/min  New Bag/Given 08/21/21 1431)  etomidate (AMIDATE) injection (20 mg Intravenous Given 08/21/21 1428)  succinylcholine (ANECTINE) injection (100 mg Intravenous Given 08/21/21 1428)  levETIRAcetam (KEPPRA) IVPB 1000 mg/100 mL premix (2,000 mg Intravenous New Bag/Given 08/21/21 1455)  LORazepam (ATIVAN) injection 2 mg (2 mg Intravenous Given 08/21/21 1420)    ED Course/ Medical Decision Making/ A&P                           Medical Decision Making Amount and/or Complexity of Data Reviewed Labs: ordered. Radiology: ordered.  Risk Prescription drug management. Decision regarding hospitalization.   This patient presents to the ED for concern of seizure, this involves an extensive number of treatment options, and is a complaint that carries with it a high risk of complications and morbidity.  The differential diagnosis includes seizure, status epilepticus, electrolyte abn   Co morbidities that complicate the patient evaluation  MS and SZ d/o   Additional history obtained:  Additional history obtained from epic chart review External records from outside source obtained and reviewed including EMS report   Lab Tests:  I Ordered, and personally interpreted labs.  The pertinent results include:  cbc with wbc elevated at 20.2, cmp with glucose elevated 153; ua neg for infection; uds + mj   Imaging Studies ordered:  I ordered imaging  studies including cxr  I independently visualized and interpreted imaging which showed  IMPRESSION:  Small patchy infiltrates in left lower lung fields suggest  atelectasis/pneumonia.   I agree with the radiologist interpretation   Cardiac Monitoring:  The patient was maintained on a cardiac monitor.  I personally viewed and interpreted the cardiac monitored which showed an underlying rhythm of: sinus brady   Medicines ordered and prescription drug management:  I ordered medication including ativan and keppra  for seizure  Reevaluation of the patient after these medicines showed that the patient improved I have reviewed the patients home medicines and have made adjustments as needed   Test Considered:  eeg   Critical Interventions:  Intubation for airway protection   Consultations Obtained:  I requested consultation with the neurologist (Dr. Amada Jupiter),  and discussed lab and imaging findings as well as pertinent plan - He will order stat EEG.  He saw pt immediately. Pt d/w CCM for admission.  Problem List / ED Course:  Status epilepticus:  pt received 4 mg ativan iv and keppra has been started.  Pt still seizing, so he's given 2 more mg ativan and intubated for airway protection.  He is put on a propofol drip and versed drip.  He does not appear to be seizing.  EEG tech here for further seizure eval.   Reevaluation:  After the interventions noted above, I reevaluated the patient and found that they have :improved   Social Determinants of Health:  Lives at home   Dispostion:  After consideration of the diagnostic results and the patients response to treatment, I feel that the patent would benefit from admission.    CRITICAL CARE Performed by: Jacalyn Lefevre   Total critical care time: 45 minutes  Critical care time was exclusive of separately billable procedures and treating other patients.  Critical care was necessary to treat or prevent imminent or  life-threatening deterioration.  Critical care was time spent personally by me on the following activities: development of treatment plan with patient and/or surrogate as well as nursing, discussions with consultants, evaluation of patient's response to treatment, examination of patient, obtaining history from patient or surrogate, ordering and performing treatments and interventions, ordering and review of laboratory studies, ordering and review of radiographic studies, pulse  oximetry and re-evaluation of patient's condition.         Final Clinical Impression(s) / ED Diagnoses Final diagnoses:  Status epilepticus [facility])    Rx / DC Orders ED Discharge Orders     None         Jacalyn Lefevre, MD 08/21/21 [phone]

## 2021-08-21 NOTE — Progress Notes (Signed)
Just finished moving pt from ed 002c to [id number]

## 2021-08-21 NOTE — Progress Notes (Signed)
LTM maint complete - no skin breakdown [facility] monitored, Event button test confirmed by [facility]. ? ?

## 2021-08-21 NOTE — Progress Notes (Signed)
Pt arrived with a wallet with a $5 bill and 6 $1 bills, numerous credit/debit cards, social security card and an ID. Belongings also included a pair of shoes with a brace in the left shoe, pants, belt and a shirt.

## 2021-08-21 NOTE — ED Triage Notes (Signed)
PT with hx of seizures here via [facility] from home.  Mom stated pt had been trying to refill prescription x 2 days and was unable.  Mom found pt seizing in his recliner - unknown time - she had last seen him last night, but thinks he had breakfast this am.  Per [facility], pt was post-ictal and on NRB upon their arrival.  CBG OF 132, BP IN 180'S.  Pt unresponsive upon arrival.  Actively seizing.

## 2021-08-21 NOTE — Progress Notes (Signed)
Patient intubated by MD with 7.5 ETT taped at 25 cm at the lip, good color change on ETCO2 detector, good BBS, SATS 100%, will continue to monitor patient.

## 2021-08-22 ENCOUNTER — Inpatient Hospital Stay (HOSPITAL_COMMUNITY): Payer: Medicare (Managed Care)

## 2021-08-22 DIAGNOSIS — G40901 Epilepsy, unspecified, not intractable, with status epilepticus: Secondary | ICD-10-CM

## 2021-08-22 LAB — GLUCOSE, CAPILLARY
Glucose-Capillary: 91 mg/dL (ref 70–99)
Glucose-Capillary: 102 mg/dL — ABNORMAL HIGH (ref 70–99)
Glucose-Capillary: 87 mg/dL (ref 70–99)

## 2021-08-22 LAB — URINALYSIS, ROUTINE W REFLEX MICROSCOPIC
Bacteria, UA: NONE SEEN
Bilirubin Urine: NEGATIVE
Glucose, UA: NEGATIVE mg/dL
Ketones, ur: 80 mg/dL — AB
Nitrite: NEGATIVE
Protein, ur: 30 mg/dL — AB
RBC / HPF: >50 RBC/hpf — ABNORMAL HIGH (ref 0–5)
Specific Gravity, Urine: 1.028 (ref 1.005–1.030)
pH: 5 (ref 5.0–8.0)

## 2021-08-22 LAB — PHOSPHORUS: Phosphorus: 3.4 mg/dL (ref 2.5–4.6)

## 2021-08-22 LAB — CBC
HCT: 39.2 % (ref 39.0–52.0)
Hemoglobin: 13.5 g/dL (ref 13.0–17.0)
MCH: 32.3 pg (ref 26.0–34.0)
MCHC: 34.4 g/dL (ref 30.0–36.0)
MCV: 93.8 fL (ref 80.0–100.0)
Platelets: 171 10*3/uL (ref 150–400)
RBC: 4.18 MIL/uL — ABNORMAL LOW (ref 4.22–5.81)
RDW: 13 % (ref 11.5–15.5)
WBC: 19.1 10*3/uL — ABNORMAL HIGH (ref 4.0–10.5)
nRBC: 0 % (ref 0.0–0.2)

## 2021-08-22 LAB — BASIC METABOLIC PANEL
Anion gap: 8 (ref 5–15)
BUN: 9 mg/dL (ref 6–20)
CO2: 22 mmol/L (ref 22–32)
Calcium: 7.9 mg/dL — ABNORMAL LOW (ref 8.9–10.3)
Chloride: 110 mmol/L (ref 98–111)
Creatinine, Ser: 0.93 mg/dL (ref 0.61–1.24)
GFR, Estimated: >60 mL/min (ref >60)
Glucose, Bld: 96 mg/dL (ref 70–99)
Potassium: 3.4 mmol/L — ABNORMAL LOW (ref 3.5–5.1)
Sodium: 140 mmol/L (ref 135–145)

## 2021-08-22 LAB — HEMOGLOBIN A1C
Hgb A1c MFr Bld: 5.5 % (ref 4.8–5.6)
Mean Plasma Glucose: 111.15 mg/dL

## 2021-08-22 LAB — CK TOTAL AND CKMB (NOT AT ARMC)
CK, MB: 5.4 ng/mL — ABNORMAL HIGH (ref 0.5–5.0)
Relative Index: 0.3 (ref 0.0–2.5)
Total CK: 1694 U/L — ABNORMAL HIGH (ref 49–397)

## 2021-08-22 LAB — MAGNESIUM: Magnesium: 1.7 mg/dL (ref 1.7–2.4)

## 2021-08-22 LAB — TRIGLYCERIDES: Triglycerides: 61 mg/dL (ref <150)

## 2021-08-22 LAB — STREP PNEUMONIAE URINARY ANTIGEN: Strep Pneumo Urinary Antigen: NEGATIVE

## 2021-08-22 MED ORDER — INSULIN ASPART 100 UNIT/ML IJ SOLN
0.0000 [IU] | INTRAMUSCULAR | Status: DC
  Administered 2021-08-23 – 2021-08-24 (×2): 1 [IU] via SUBCUTANEOUS

## 2021-08-22 MED ORDER — POTASSIUM CHLORIDE 10 MEQ/100ML IV SOLN
10.0000 meq | INTRAVENOUS | Status: AC
  Administered 2021-08-22 (×4): 10 meq via INTRAVENOUS
  Filled 2021-08-22 (×4): qty 100

## 2021-08-22 MED ORDER — POTASSIUM CHLORIDE 20 MEQ PO PACK
40.0000 meq | PACK | Freq: Two times a day (BID) | ORAL | Status: DC
Start: 2021-08-22 — End: 2021-08-22
  Administered 2021-08-22: 40 meq
  Filled 2021-08-22: qty 2

## 2021-08-22 MED ORDER — VITAL HIGH PROTEIN PO LIQD
1000.0000 mL | ORAL | Status: AC
  Administered 2021-08-22: 1000 mL

## 2021-08-22 MED ORDER — PROSOURCE TF PO LIQD
45.0000 mL | Freq: Two times a day (BID) | ORAL | Status: DC
  Administered 2021-08-22 – 2021-08-24 (×5): 45 mL
  Filled 2021-08-22 (×5): qty 45

## 2021-08-22 MED ORDER — MAGNESIUM SULFATE 2 GM/50ML IV SOLN
2.0000 g | Freq: Once | INTRAVENOUS | Status: AC
Start: 2021-08-22 — End: 2021-08-22
  Administered 2021-08-22: 2 g via INTRAVENOUS
  Filled 2021-08-22: qty 50

## 2021-08-22 MED ORDER — SODIUM CHLORIDE 0.9 % IV SOLN: INTRAVENOUS | Status: DC | PRN

## 2021-08-22 MED ORDER — CALCIUM GLUCONATE-NACL 1-0.675 GM/50ML-% IV SOLN
1.0000 g | Freq: Once | INTRAVENOUS | Status: AC
  Administered 2021-08-22: 1000 mg via INTRAVENOUS
  Filled 2021-08-22: qty 50

## 2021-08-22 MED ORDER — POTASSIUM CHLORIDE 20 MEQ PO PACK
40.0000 meq | PACK | Freq: Two times a day (BID) | ORAL | Status: DC
Start: 2021-08-22 — End: 2021-08-22

## 2021-08-22 NOTE — Progress Notes (Addendum)
NAME:  Miguel Black, MRN:  [id number], DOB:  1990/08/16, LOS: 1 ADMISSION DATE:  08/21/2021, CONSULTATION DATE: 08/21/2021 REFERRING MD: Dr. Janan Halter, CHIEF COMPLAINT: Seizures with concern of status epilepticus  History of Present Illness:  Miguel Black is a 31 year old male with a past medical history of status epilepticus, seizures, and multiple sclerosis who presented to the ED via EMS due to seizures.  Per report family found patient in bed with seizure-like activity prompting EMS call.  Family is unsure length of time patient was seizing prior to being found.  On EMS arrival patient was seen post ictal but on ED arrival patient was again seizing.  Patient family states they have had issues getting maintenance Keppra refilled and has missed approximately 2 days of all antiepileptics.  On arrival to ED patient was intubated for airway protection and received 6 mg of Ativan and Keppra load prior to cessation of seizure-like activity.  Neurology was called and stat EEG ordered.  Blood work relatively unremarkable apart from leukocytosis with WBC 20.2.  Urinalysis normal.  Chest x-ray with patchy infiltrate left lower lung concerning for atelectasis versus possible aspiration pneumonia.  Patient febrile on admission. PCCM consulted for further management and admission.  Pertinent  Medical History  Prior status epilepticus Multiple sclerosis  Significant Hospital Events: Including procedures, antibiotic start and stop dates in addition to other pertinent events   7/29 admitted after being found seizing at home of note patient had missed 2 days of Keppra due to difficulty getting medication refilled.  Intubated in ED for airway protection 7/30 No acute issues overnight, no seizures on EEG   Interim History / Subjective:  No issues overnight, remains on EEG   Objective   Blood pressure 121/80, pulse 69, temperature 98.4 F (36.9 C), temperature source Axillary, resp. rate 18, height  5\' 9"  (1.753 m), weight 73.2 kg, SpO2 100 %.    Vent Mode: PRVC FiO2 (%):  [40 %] 40 % Set Rate:  [18 bmp] 18 bmp Vt Set:  [570 mL] 570 mL PEEP:  [5 cmH20] 5 cmH20 Plateau Pressure:  [10 cmH20-18 cmH20] 18 cmH20   Intake/Output Summary (Last 24 hours) at 08/22/2021 0724 Last data filed at 08/22/2021 0600 Gross per 24 hour  Intake 3383.57 ml  Output 900 ml  Net 2483.57 ml    Filed Weights   08/21/21 1701 08/22/21 0500  Weight: 69.4 kg 73.2 kg    Examination: General: Acute ill appearing adult male lying in bed on mechanical ventilation, in NAD HEENT: ETT, MM pink/moist, PERRL,  Neuro: Sedated on initial assessment, sedation later stopped and patient was then coughing on tube but is still not following commands  CV: s1s2 regular rate and rhythm, no murmur, rubs, or gallops,  PULM:  Clear to ascultation, no increased work of breathing, no added breath sounds  GI: soft, bowel sounds active in all 4 quadrants, non-tender, non-distended Extremities: warm/dry, no edema  Skin: no rashes or lesions  Resolved Hospital Problem list     Assessment & Plan:  Status epilepticus with history of seizures and prior status -likely due to missing anti-epileptic medication vs infection History of multiple sclerosis P: Management per neurology  Maintain neuro protective measures; goal for eurothermia, euglycemia, eunatermia, normoxia, and PCO2 goal of 35-40 Nutrition and bowel regiment  Seizure precautions  AEDs per neurology  Aspirations precautions  Frequent neuro checks  EEG remains in place May need to consider imaging given   Acute respiratory insufficiency in the setting of  status epilepticus Concern for possible aspiration pneumonia -Chest x-ray with left lower lobe infiltrate in the setting of seizure activity concerning for possible aspiration event.  Leukocytotis and febrile on admission -Strep pneumonia negative  P: Continue ventilator support with lung protective strategies   Wean PEEP and FiO2 for sats greater than 90%. Head of bed elevated 30 degrees. Plateau pressures less than 30 cm H20.  Follow intermittent chest x-ray and ABG.   SAT/SBT as tolerated, mentation preclude extubation  Ensure adequate pulmonary hygiene  Follow cultures  VAP bundle in place  PAD protocol Empiric Unasyn   Hypokalemia P: Supplement   Decreased urine output with dark coloration P: Check CK Monitor output IV hydration   Best Practice (right click and "Reselect all SmartList Selections" daily)   Diet/type: NPO DVT prophylaxis: prophylactic heparin  GI prophylaxis: PPI Lines: N/A Foley:  N/A Code Status:  full code Last date of multidisciplinary goals of care discussion:   Critical care time: 38 minutes  Miguel Black D. Miguel Pea, NP-C [facility] Personal contact information can be found on Amion  08/22/2021, 7:25 AM

## 2021-08-22 NOTE — Progress Notes (Signed)
EEG maint complete.  ?

## 2021-08-22 NOTE — Progress Notes (Signed)
Subjective: No seizures since being connected to the EEG  Exam: Vitals:   08/22/21 1229 08/22/21 1300  BP:  136/86  Pulse:  62  Resp:  18  Temp: 99.9 F (37.7 C)   SpO2:  99%   Gen: In bed, intubated  Neuro: Performed with sedation paused for about 15 minutes MS: Does not open eyes or follow commands CN: Pupils are equal and reactive, blinks to eyelid stimulation bilaterally Motor: Withdraws to noxious stimulation x4 Sensory: As above   Pertinent Labs: Creatinine 0.93  Impression: 31 year old male who presented in status epilepticus in the setting of not being able to get his Keppra refilled.  His persistent encephalopathy may simply be due to additive effects of sedating medications, but given that he is not waking up as much as I would like I do think some imaging would be prudent.  I would favor minimizing sedation from this point forward and continuing EEG for now.  Recommendations: 1) continue Keppra 1 g twice daily 2) minimize sedation 3) CT head 4) neurology to follow  This patient is critically ill and at significant risk of neurological worsening, death and care requires constant monitoring of vital signs, hemodynamics,respiratory and cardiac monitoring, neurological assessment, discussion with family, other specialists and medical decision making of high complexity. I spent 40 minutes of neurocritical care time  in the care of  this patient. This was time spent independent of any time provided by nurse practitioner or PA.  Ritta Slot, MD [facility] [phone]  If 7pm- 7am, please page neurology on call as listed in AMION. 08/22/2021  1:33 PM  If 7pm- 7am, please page neurology on call as listed in AMION.

## 2021-08-22 NOTE — Procedures (Signed)
Patient Name: Miguel Black  MRN: [id number]  Epilepsy Attending: Charlsie Quest  Referring Physician/Provider: Rejeana Brock, MD  Duration: 08/21/2021 1515 to 08/22/2021 1515  Patient history: 31 year old male with a history of MS and epilepsy who presents with recurrent status epilepticus. EEG to evaluate for seizure.  Level of alertness:  comatose  AEDs during EEG study: LEV, propofol, versed  Technical aspects: This EEG study was done with scalp electrodes positioned according to the 10-20 International system of electrode placement. Electrical activity was reviewed with band pass filter of 1-70Hz , sensitivity of 7 uV/mm, display speed of 85mm/sec with a 60Hz  notched filter applied as appropriate. EEG data were recorded continuously and digitally stored.  Video monitoring was available and reviewed as appropriate.  Description: EEG showed continuous generalized 3 to 6 Hz theta-delta slowing admixed with 15 to 18 Hz beta activity  distributed symmetrically and diffusely. Hyperventilation and photic stimulation were not performed.     ABNORMALITY - Continuous slow, generalized - Excessive beta, generalized  IMPRESSION: This study is suggestive of severe diffuse encephalopathy, nonspecific etiology but likely related to sedation. No seizures or epileptiform discharges were seen throughout the recording.  Ziza Hastings 

## 2021-08-22 NOTE — Plan of Care (Signed)
  Problem: Safety: Goal: Non-violent Restraint(s) Outcome: Progressing   Problem: Education: Goal: Knowledge of General Education information will improve Description: Including pain rating scale, medication(s)/side effects and non-pharmacologic comfort measures Outcome: Progressing   Problem: Health Behavior/Discharge Planning: Goal: Ability to manage health-related needs will improve Outcome: Progressing   Problem: Activity: Goal: Risk for activity intolerance will decrease Outcome: Progressing   Problem: Nutrition: Goal: Adequate nutrition will be maintained Outcome: Progressing   Problem: Skin Integrity: Goal: Risk for impaired skin integrity will decrease Outcome: Progressing   Problem: Education: Goal: Expressions of having a comfortable level of knowledge regarding the disease process will increase Outcome: Progressing   Problem: Coping: Goal: Ability to adjust to condition or change in health will improve Outcome: Progressing   Problem: Health Behavior/Discharge Planning: Goal: Compliance with prescribed medication regimen will improve Outcome: Progressing   Problem: Medication: Goal: Risk for medication side effects will decrease Outcome: Progressing   Problem: Safety: Goal: Verbalization of understanding the information provided will improve Outcome: Progressing

## 2021-08-22 NOTE — Progress Notes (Signed)
RT transported patient from 4N27 to CT and back with RN. No complications. RT will continue to monitor.  

## 2021-08-23 ENCOUNTER — Inpatient Hospital Stay (HOSPITAL_COMMUNITY): Payer: Medicare (Managed Care)

## 2021-08-23 DIAGNOSIS — J9601 Acute respiratory failure with hypoxia: Secondary | ICD-10-CM | POA: Diagnosis not present

## 2021-08-23 DIAGNOSIS — G40901 Epilepsy, unspecified, not intractable, with status epilepticus: Secondary | ICD-10-CM | POA: Diagnosis not present

## 2021-08-23 HISTORY — DX: Acute respiratory failure with hypoxia: J96.01

## 2021-08-23 LAB — BASIC METABOLIC PANEL
Anion gap: 11 (ref 5–15)
BUN: 9 mg/dL (ref 6–20)
CO2: 19 mmol/L — ABNORMAL LOW (ref 22–32)
Calcium: 8.3 mg/dL — ABNORMAL LOW (ref 8.9–10.3)
Chloride: 111 mmol/L (ref 98–111)
Creatinine, Ser: 0.97 mg/dL (ref 0.61–1.24)
GFR, Estimated: >60 mL/min (ref >60)
Glucose, Bld: 85 mg/dL (ref 70–99)
Potassium: 3.7 mmol/L (ref 3.5–5.1)
Sodium: 141 mmol/L (ref 135–145)

## 2021-08-23 LAB — GLUCOSE, CAPILLARY
Glucose-Capillary: 100 mg/dL — ABNORMAL HIGH (ref 70–99)
Glucose-Capillary: 108 mg/dL — ABNORMAL HIGH (ref 70–99)
Glucose-Capillary: 108 mg/dL — ABNORMAL HIGH (ref 70–99)
Glucose-Capillary: 111 mg/dL — ABNORMAL HIGH (ref 70–99)
Glucose-Capillary: 124 mg/dL — ABNORMAL HIGH (ref 70–99)
Glucose-Capillary: 137 mg/dL — ABNORMAL HIGH (ref 70–99)
Glucose-Capillary: 84 mg/dL (ref 70–99)

## 2021-08-23 LAB — PHOSPHORUS: Phosphorus: 2.2 mg/dL — ABNORMAL LOW (ref 2.5–4.6)

## 2021-08-23 LAB — CBC
HCT: 35.7 % — ABNORMAL LOW (ref 39.0–52.0)
Hemoglobin: 12.2 g/dL — ABNORMAL LOW (ref 13.0–17.0)
MCH: 32.3 pg (ref 26.0–34.0)
MCHC: 34.2 g/dL (ref 30.0–36.0)
MCV: 94.4 fL (ref 80.0–100.0)
Platelets: 160 10*3/uL (ref 150–400)
RBC: 3.78 MIL/uL — ABNORMAL LOW (ref 4.22–5.81)
RDW: 13.1 % (ref 11.5–15.5)
WBC: 17.8 10*3/uL — ABNORMAL HIGH (ref 4.0–10.5)
nRBC: 0 % (ref 0.0–0.2)

## 2021-08-23 LAB — MAGNESIUM: Magnesium: 2.1 mg/dL (ref 1.7–2.4)

## 2021-08-23 MED ORDER — OSMOLITE 1.5 CAL PO LIQD
1000.0000 mL | ORAL | Status: DC
  Administered 2021-08-23: 1000 mL

## 2021-08-23 MED ORDER — DEXMEDETOMIDINE HCL IN NACL 400 MCG/100ML IV SOLN
0.4000 ug/kg/h | INTRAVENOUS | Status: DC
  Administered 2021-08-23: 0.9 ug/kg/h via INTRAVENOUS
  Administered 2021-08-23: 0.4 ug/kg/h via INTRAVENOUS
  Filled 2021-08-23: qty 100

## 2021-08-23 MED ORDER — DEXMEDETOMIDINE HCL IN NACL 400 MCG/100ML IV SOLN
INTRAVENOUS | Status: AC
  Administered 2021-08-23: 0.6 ug/kg/h via INTRAVENOUS
  Filled 2021-08-23: qty 100

## 2021-08-23 MED ORDER — POTASSIUM PHOSPHATES 15 MMOLE/5ML IV SOLN
15.0000 mmol | Freq: Once | INTRAVENOUS | Status: AC
  Administered 2021-08-23: 15 mmol via INTRAVENOUS
  Filled 2021-08-23: qty 5

## 2021-08-23 MED ORDER — LEVETIRACETAM 1000 MG PO TABS: 1000.0000 mg | ORAL_TABLET | Freq: Two times a day (BID) | ORAL | 11 refills | Status: DC

## 2021-08-23 MED ORDER — MIDAZOLAM HCL 2 MG/2ML IJ SOLN
2.0000 mg | INTRAMUSCULAR | Status: DC | PRN
  Administered 2021-08-23 – 2021-08-24 (×2): 2 mg via INTRAVENOUS
  Filled 2021-08-23 (×2): qty 2

## 2021-08-23 MED ORDER — POTASSIUM CHLORIDE 20 MEQ PO PACK
20.0000 meq | PACK | Freq: Once | ORAL | Status: AC
  Administered 2021-08-23: 20 meq
  Filled 2021-08-23: qty 1

## 2021-08-23 NOTE — Procedures (Addendum)
Patient Name: Miguel Black  MRN: [id number]  Epilepsy Attending: Charlsie Quest  Referring Physician/Provider: Rejeana Brock, MD  Duration: 08/22/2021 1515 to 08/23/2021 1426   Patient history: 31 year old male with a history of MS and epilepsy who presents with recurrent status epilepticus. EEG to evaluate for seizure.   Level of alertness:  comatose   AEDs during EEG study: LEV, propofol   Technical aspects: This EEG study was done with scalp electrodes positioned according to the 10-20 International system of electrode placement. Electrical activity was reviewed with band pass filter of 1-70Hz , sensitivity of 7 uV/mm, display speed of 13mm/sec with a 60Hz  notched filter applied as appropriate. EEG data were recorded continuously and digitally stored.  Video monitoring was available and reviewed as appropriate.   Description: EEG showed continuous generalized 3 to 6 Hz theta-delta slowing admixed with 15 to 18 Hz beta activity  distributed symmetrically and diffusely. Hyperventilation and photic stimulation were not performed.      ABNORMALITY - Continuous slow, generalized - Excessive beta, generalized   IMPRESSION: This study is suggestive of severe diffuse encephalopathy, nonspecific etiology but likely related to sedation. No seizures or epileptiform discharges were seen throughout the recording.   Sarah Baez 

## 2021-08-23 NOTE — Progress Notes (Signed)
Initial Nutrition Assessment  DOCUMENTATION CODES:   Not applicable  INTERVENTION:  - will adjust TF regimen: Osmolite 1.5 @ 30 ml/hr to advance by 10 ml every 8 hours to reach goal rate of 60 ml/hr with 45 ml Prosource TF BID.  - this regimen will provide 2240 kcal, 112 grams protein, and 1097 ml free water.  - free water per CCM.   NUTRITION DIAGNOSIS:   Inadequate oral intake related to acute illness, inability to eat as evidenced by NPO status.  GOAL:   Patient will meet greater than or equal to 90% of their needs  MONITOR:   Vent status, TF tolerance, Labs, Weight trends  REASON FOR ASSESSMENT:   Ventilator, Consult Enteral/tube feeding initiation and management  ASSESSMENT:   31 year old male with medical history of status epilepticus, seizures, and multiple sclerosis. He presented to the ED via EMS after family found him in bed with seizure-like activity. When EMS arrived, he was post ictal and when they arrived to the ED he was again having a seizure. His family reported having difficulty getting maintenance Keppra refilled and that he had missed 2 days of all anti-epileptics. In the ED, he was intubated for airway protection. Neurology was consulted and state EEG done. In the ED, CXR showed patchy infiltrate in left lower lung concerning for atelectasis versus possible aspiration pneumonia. Patient febrile on admission. He was admitted for status epilepticus.  Patient remains intubated since 7/29. Cortrak placed today (distal stomach per abdominal x-ray). He is receiving TF regimen per protocol since 7/30 late morning: Vital High Protein @ 40 ml/hr with 45 ml Prosource TF BID. This regimen is providing 1040 kcal, 106 grams protein, and 802 ml free water.   Patient was proned and was flipped back to supine around 1130-1200 today. Able to talk with RN in person after that time and again via secure chat a short time ago. Patient weaned this afternoon and extubation to be  considered/attempted on 8/1. Patient to remain intubated today.   EEG done today.   Patient was assessed by a [place] RD on 03/09/21 though patient was unavailable at the time of visit.   Weight today is 169 lb, weight yesterday was 161 lb (that weight used to estimate needs), and weight on 6/1 was 153 lb.    Patient is currently intubated on ventilator support MV: 8.7 L/min Temp (24hrs), Avg:99.4 F (37.4 C), Min:98.6 F (37 C), Max:100.2 F (37.9 C) Propofol: none  Labs reviewed; CBGs: 108, 111, 108 mg/dl, Ca: 8.3 mg/dl, Phos: 2.2 mg/dl.  Medications reviewed; 100 mg colace BID, sliding scale novolog, 40 mg protonix/day per tube, 17 g miralax/day, 20 mEq Klor-Con x1 dose 7/31, 10 mEq IV KCl x4 runs 7/30, 15 mmol IV KPhos x1 run 7/31.  IVF; NS @ 125 ml/hr.    NUTRITION - FOCUSED PHYSICAL EXAM:  Flowsheet Row Most Recent Value  Orbital Region Unable to assess  [ETT holder]  Upper Arm Region No depletion  Thoracic and Lumbar Region Unable to assess  Buccal Region Unable to assess  [ETT holder]  Temple Region No depletion  Clavicle Bone Region No depletion  Clavicle and Acromion Bone Region No depletion  Scapular Bone Region No depletion  Dorsal Hand No depletion  Patellar Region No depletion  Anterior Thigh Region No depletion  Posterior Calf Region No depletion  Edema (RD Assessment) None  Hair Reviewed  Eyes Unable to assess  Mouth Unable to assess  Skin Reviewed  Nails Reviewed  Diet Order:   Diet Order             Diet NPO time specified  Diet effective now                   EDUCATION NEEDS:   No education needs have been identified at this time  Skin:  Skin Assessment: Reviewed RN Assessment  Last BM:  PTA/unknown  Height:   Ht Readings from Last 1 Encounters:  08/21/21 5\' 9"  (1.753 m)    Weight:   Wt Readings from Last 1 Encounters:  08/23/21 76.5 kg     BMI:  Body mass index is 24.91 kg/m.  Estimated Nutritional  Needs:  Kcal:  2200-2400 kcal Protein:  110-125 grams Fluid:  >/= 2.2 L/day     08/25/21, MS, RD, LDN, CNSC Registered Dietitian II Inpatient Clinical Nutrition RD pager # and on-call/weekend pager # available in [facility]

## 2021-08-23 NOTE — Plan of Care (Signed)
  Problem: Safety: Goal: Non-violent Restraint(s) Outcome: Progressing   Problem: Clinical Measurements: Goal: Will remain free from infection Outcome: Progressing Goal: Respiratory complications will improve Outcome: Progressing Goal: Cardiovascular complication will be avoided Outcome: Progressing   Problem: Activity: Goal: Risk for activity intolerance will decrease Outcome: Progressing   Problem: Nutrition: Goal: Adequate nutrition will be maintained Outcome: Progressing   Problem: Elimination: Goal: Will not experience complications related to bowel motility Outcome: Progressing Goal: Will not experience complications related to urinary retention Outcome: Progressing   Problem: Safety: Goal: Ability to remain free from injury will improve Outcome: Progressing   Problem: Skin Integrity: Goal: Risk for impaired skin integrity will decrease Outcome: Progressing   Problem: Fluid Volume: Goal: Ability to maintain a balanced intake and output will improve Outcome: Progressing   Problem: Metabolic: Goal: Ability to maintain appropriate glucose levels will improve Outcome: Progressing   Problem: Nutritional: Goal: Maintenance of adequate nutrition will improve Outcome: Progressing Goal: Progress toward achieving an optimal weight will improve Outcome: Progressing   Problem: Skin Integrity: Goal: Risk for impaired skin integrity will decrease Outcome: Progressing   Problem: Tissue Perfusion: Goal: Adequacy of tissue perfusion will improve Outcome: Progressing

## 2021-08-23 NOTE — Procedures (Signed)
Cortrak  Person Inserting Tube:  Reshawn Ostlund C, RD Tube Type:  Cortrak - 43 inches Tube Size:  10 Tube Location:  Left nare Secured by: Bridle Technique Used to Measure Tube Placement:  Marking at nare/corner of mouth Cortrak Secured At:  76 cm   Cortrak Tube Team Note:  Consult received to place a Cortrak feeding tube.   X-ray is required, abdominal x-ray has been ordered by the Cortrak team. Please confirm tube placement before using the Cortrak tube.   If the tube becomes dislodged please keep the tube and contact the Cortrak team at [email or web address] ([email or web address]) for replacement.  If after hours and replacement cannot be delayed, place a NG tube and confirm placement with an abdominal x-ray.   Tayna Smethurst P., RD, LDN, CNSC See AMiON for contact information    

## 2021-08-23 NOTE — Progress Notes (Signed)
eLink Physician-Brief Progress Note Patient Name: Miguel Black DOB: 02/14/1990 MRN: [id number]   Date of Service  08/23/2021  HPI/Events of Note  Asymptomatic sinus bradycardia but HR in high 30's at times.  Concern related to precedex  eICU Interventions  Stop precedex and use versed prn for agitation     Intervention Category Major Interventions: Other:  Henry Russel, Demetrius Charity 08/23/2021, 8:59 PM

## 2021-08-23 NOTE — Plan of Care (Signed)
  Problem: Safety: Goal: Non-violent Restraint(s) Outcome: Progressing   Problem: Education: Goal: Expressions of having a comfortable level of knowledge regarding the disease process will increase Outcome: Progressing   Problem: Coping: Goal: Ability to adjust to condition or change in health will improve Outcome: Progressing Goal: Ability to identify appropriate support needs will improve Outcome: Progressing   Problem: Health Behavior/Discharge Planning: Goal: Compliance with prescribed medication regimen will improve Outcome: Progressing   Problem: Medication: Goal: Risk for medication side effects will decrease Outcome: Progressing   Problem: Clinical Measurements: Goal: Complications related to the disease process, condition or treatment will be avoided or minimized Outcome: Progressing Goal: Diagnostic test results will improve Outcome: Progressing   Problem: Safety: Goal: Verbalization of understanding the information provided will improve Outcome: Progressing   Problem: Self-Concept: Goal: Level of anxiety will decrease Outcome: Progressing

## 2021-08-23 NOTE — Assessment & Plan Note (Signed)
-   Complete 5 days of antibiotics.

## 2021-08-23 NOTE — Telephone Encounter (Signed)
E-scribed refill per MD request.

## 2021-08-23 NOTE — Progress Notes (Signed)
Neurology Progress Note  Subjective: No seizures since being connected to the EEG, will discontinue Propofol discontinued, precedex gtt initiated Patient is alert and follows commands on exam  Plans for possible extubation today per CCM   Exam: Vitals:   08/23/21 1135 08/23/21 1145  BP:    Pulse:    Resp:    Temp:  99.7 F (37.6 C)  SpO2: 97%    Gen: In bed, intubated, on precedex gtt   Neuro: Performed with precedex gtt running, not paused for examination MS: Patient is slightly drowsy but arouses to voice. He follows commands and nods appropriately to examiner questions.  CN: Pupils are equal and reactive and briskly reactive to light bilaterally, blinks to threat bilaterally, cough and gag reflexes are intact, head is midline, tongue protrudes midline Motor: Moves all 4 extremities antigravity without vertical drift. Patient does have some decreased toe wiggle on the left (per mother, chronic due to MS) Sensory: Sensation intact and symmetric to light touch throughout extremities  Pertinent Labs: Lab Results  Component Value Date   WBC 17.8 (H) 08/23/2021   HGB 12.2 (L) 08/23/2021   HCT 35.7 (L) 08/23/2021   MCV 94.4 08/23/2021   PLT 160 08/23/2021    Latest Reference Range & Units 08/23/21 01:03  Sodium 135 - 145 mmol/L 141  Potassium 3.5 - 5.1 mmol/L 3.7  Chloride 98 - 111 mmol/L 111  CO2 22 - 32 mmol/L 19 (L)  Glucose 70 - 99 mg/dL 85  BUN 6 - 20 mg/dL 9  Creatinine 3.23 - 5.57 mg/dL 3.22  Calcium 8.9 - 02.5 mg/dL 8.3 (L)  Anion gap 5 - 15  11  Phosphorus 2.5 - 4.6 mg/dL 2.2 (L)  Magnesium 1.7 - 2.4 mg/dL 2.1  GFR, Estimated >42 mL/min >60  (L): Data is abnormally low  Impression: 31 year old male who presented in status epilepticus in the setting of not being able to get his Keppra refilled.  Initial concern for persistent encephalopathy with significant improvement in mental status on examination today. EEG without seizures or epileptiform discharges  7/29-7/31, will discontinue. CT imaging 7/30 stable without acute abnormality.   Recommendations: - continue Keppra 1 g twice daily - discontinue EEG - follow up with outpatient neurology - will follow until patient is extubated  -- Lanae Boast, AGACNP-BC [facility] [phone]  Neurology Attending Attestation   I examined the patient and discussed plan with Ms. Toberman NP. Above note has been edited by me to reflect my findings and recommendations.    This patient is critically ill and at significant risk of neurological worsening, death and care requires constant monitoring of vital signs, hemodynamics,respiratory and cardiac monitoring, neurological assessment, discussion with family, other specialists and medical decision making of high complexity. I spent 45 minutes of neurocritical care time  in the care of  this patient. This was time spent independent of any time provided by nurse practitioner or PA.   Bing Neighbors, MD [facility] [phone]   If 7pm- 7am, please page neurology on call as listed in AMION.

## 2021-08-23 NOTE — Telephone Encounter (Signed)
Appears pt admitted to [facility] 08/21/21 for seizures. Did you want to review before we send in refill?

## 2021-08-23 NOTE — Assessment & Plan Note (Addendum)
Passed brief SBT Chest CXR clear. Normal auscultation.   - Extubate today.  - Progressive mobilization.  - Bedside swallow evaluation

## 2021-08-23 NOTE — Progress Notes (Signed)
Pt w/ STE/aVF on monitor. EKG normal sinus. CCM informed. Pt asymptomatic.

## 2021-08-23 NOTE — Progress Notes (Signed)
Pt's HR as low as 39 bbp. Neurology on the unit and aware, CCM informed.

## 2021-08-23 NOTE — Progress Notes (Signed)
[facility] ADULT ICU REPLACEMENT PROTOCOL   The patient does apply for the [facility] Adult ICU Electrolyte Replacment Protocol based on the criteria listed below:   1.Exclusion criteria: TCTS patients, ECMO patients, and Dialysis patients 2. Is GFR >/= 30 ml/min? Yes.    Patient's GFR today is >60 3. Is SCr </= 2? Yes.   Patient's SCr is 0.97 mg/dL 4. Did SCr increase >/= 0.5 in 24 hours? No. 5.Pt's weight >40kg  Yes.   6. Abnormal electrolyte(s): phos 2.2, K+ 3.7  7. Electrolytes replaced per protocol 8.  Call MD STAT for K+ </= 2.5, Phos </= 1, or Mag </= 1 Physician:  n/a  Melvern Banker 08/23/2021 4:44 AM

## 2021-08-23 NOTE — Progress Notes (Signed)
LTM EEG discontinued - no skin breakdown at unhook.   

## 2021-08-23 NOTE — Progress Notes (Signed)
NAME:  [name], MRN:  [id number], DOB:  [birth year], LOS: 2 ADMISSION DATE:  08/21/2021, CONSULTATION DATE: 08/21/2021 REFERRING MD: Dr. Janan Halter, CHIEF COMPLAINT: Seizures with concern of status epilepticus  History of Present Illness:  Miguel Black is a 31 year old male with a past medical history of status epilepticus, seizures, and multiple sclerosis who presented to the ED via EMS due to seizures.  Per report family found patient in bed with seizure-like activity prompting EMS call.  Family is unsure length of time patient was seizing prior to being found.  On EMS arrival patient was seen post ictal but on ED arrival patient was again seizing.  Patient family states they have had issues getting maintenance Keppra refilled and has missed approximately 2 days of all antiepileptics.  On arrival to ED patient was intubated for airway protection and received 6 mg of Ativan and Keppra load prior to cessation of seizure-like activity.  Neurology was called and stat EEG ordered.  Blood work relatively unremarkable apart from leukocytosis with WBC 20.2.  Urinalysis normal.  Chest x-ray with patchy infiltrate left lower lung concerning for atelectasis versus possible aspiration pneumonia.  Patient febrile on admission. PCCM consulted for further management and admission.  Pertinent  Medical History  Prior status epilepticus Multiple sclerosis  Significant Hospital Events: Including procedures, antibiotic start and stop dates in addition to other pertinent events   7/29 admitted after being found seizing at home of note patient had missed 2 days of Keppra due to difficulty getting medication refilled.  Intubated in ED for airway protection 7/30 No acute issues overnight, no seizures on EEG   Interim History / Subjective:  No issues overnight, remains on EEG   Objective   Blood pressure (!) 137/97, pulse 76, temperature 98.9 F (37.2 C), temperature source Axillary, resp. rate 18,  height 5\' 9"  (1.753 m), weight 76.5 kg, SpO2 99 %.    Vent Mode: PRVC FiO2 (%):  [40 %] 40 % Set Rate:  [18 bmp] 18 bmp Vt Set:  [570 mL] 570 mL PEEP:  [5 cmH20] 5 cmH20 Plateau Pressure:  [15 cmH20-21 cmH20] 17 cmH20   Intake/Output Summary (Last 24 hours) at 08/23/2021 08/25/2021 Last data filed at 08/23/2021 0800 Gross per 24 hour  Intake 4517.08 ml  Output 2065 ml  Net 2452.08 ml    Filed Weights   08/21/21 1701 08/22/21 0500 08/23/21 0300  Weight: 69.4 kg 73.2 kg 76.5 kg    Examination: General: Acute ill appearing adult male lying in bed on mechanical ventilation, in NAD HEENT: ETT, MM pink/moist, PERRL,  Neuro: Sedated on initial assessment, sedation later stopped and patient was then coughing on tube but is still not following commands  CV: s1s2 regular rate and rhythm, no murmur, rubs, or gallops,  PULM:  Clear to ascultation, no increased work of breathing, no added breath sounds  GI: soft, bowel sounds active in all 4 quadrants, non-tender, non-distended Extremities: warm/dry, no edema  Skin: no rashes or lesions  Ancillary tests personally reviewed:     Assessment & Plan:  Status epilepticus (HCC) Currently no seizures.  No epileptiform activity on LTM  - Wean propofol off today.  - Continue current levetiracetam  Acute respiratory failure with hypoxia (HCC) Currently apneic on attempts to SBT. Chest CXR clear. Normal auscultation.   - Mental status likely will drive extubation.  - Consider Precedex if needs some sedation to facilitate weaning.   Aspiration pneumonia (HCC) - Complete 5 days of antibiotics.  Best Practice (right click and "Reselect all SmartList Selections" daily)   Diet/type: tubefeeds DVT prophylaxis: prophylactic heparin  GI prophylaxis: PPI Lines: N/A Foley:  N/A Code Status:  full code Last date of multidisciplinary goals of care discussion:   CRITICAL CARE Performed by: Lynnell Catalan   Total critical care time: 35  minutes  Critical care time was exclusive of separately billable procedures and treating other patients.  Critical care was necessary to treat or prevent imminent or life-threatening deterioration.  Critical care was time spent personally by me on the following activities: development of treatment plan with patient and/or surrogate as well as nursing, discussions with consultants, evaluation of patient's response to treatment, examination of patient, obtaining history from patient or surrogate, ordering and performing treatments and interventions, ordering and review of laboratory studies, ordering and review of radiographic studies, pulse oximetry, re-evaluation of patient's condition and participation in multidisciplinary rounds.  Lynnell Catalan, MD [facility] ICU Physician [facility] Critical [facility]  Pager: [phone] Mobile: [phone] After hours: [phone].   08/23/2021, 9:09 AM

## 2021-08-23 NOTE — Progress Notes (Signed)
LTM maintenance.  All under 10. No skin breakdown

## 2021-08-23 NOTE — Assessment & Plan Note (Signed)
Currently no seizures.  No epileptiform activity on LTM Off sedation and following commands.   - Continue current levetiracetam

## 2021-08-23 NOTE — Progress Notes (Signed)
TF restarted at 0045.

## 2021-08-24 ENCOUNTER — Telehealth: Payer: Self-pay | Admitting: Neurology

## 2021-08-24 LAB — CBC
HCT: 35 % — ABNORMAL LOW (ref 39.0–52.0)
Hemoglobin: 12 g/dL — ABNORMAL LOW (ref 13.0–17.0)
MCH: 31.9 pg (ref 26.0–34.0)
MCHC: 34.3 g/dL (ref 30.0–36.0)
MCV: 93.1 fL (ref 80.0–100.0)
Platelets: 162 10*3/uL (ref 150–400)
RBC: 3.76 MIL/uL — ABNORMAL LOW (ref 4.22–5.81)
RDW: 13.2 % (ref 11.5–15.5)
WBC: 10 10*3/uL (ref 4.0–10.5)
nRBC: 0 % (ref 0.0–0.2)

## 2021-08-24 LAB — GLUCOSE, CAPILLARY
Glucose-Capillary: 107 mg/dL — ABNORMAL HIGH (ref 70–99)
Glucose-Capillary: 113 mg/dL — ABNORMAL HIGH (ref 70–99)
Glucose-Capillary: 137 mg/dL — ABNORMAL HIGH (ref 70–99)

## 2021-08-24 LAB — BASIC METABOLIC PANEL
Anion gap: 5 (ref 5–15)
BUN: 11 mg/dL (ref 6–20)
CO2: 22 mmol/L (ref 22–32)
Calcium: 8.1 mg/dL — ABNORMAL LOW (ref 8.9–10.3)
Chloride: 114 mmol/L — ABNORMAL HIGH (ref 98–111)
Creatinine, Ser: 0.72 mg/dL (ref 0.61–1.24)
GFR, Estimated: >60 mL/min (ref >60)
Glucose, Bld: 118 mg/dL — ABNORMAL HIGH (ref 70–99)
Potassium: 3.6 mmol/L (ref 3.5–5.1)
Sodium: 141 mmol/L (ref 135–145)

## 2021-08-24 LAB — PHOSPHORUS: Phosphorus: 2.8 mg/dL (ref 2.5–4.6)

## 2021-08-24 LAB — MAGNESIUM: Magnesium: 2 mg/dL (ref 1.7–2.4)

## 2021-08-24 MED ORDER — ACETAMINOPHEN 325 MG PO TABS: 650.0000 mg | ORAL_TABLET | Freq: Four times a day (QID) | ORAL | Status: DC | PRN

## 2021-08-24 MED ORDER — DOCUSATE SODIUM 100 MG PO CAPS
100.0000 mg | ORAL_CAPSULE | Freq: Two times a day (BID) | ORAL | Status: DC
Start: 2021-08-24 — End: 2021-08-27
  Administered 2021-08-24 – 2021-08-27 (×6): 100 mg via ORAL
  Filled 2021-08-24 (×6): qty 1

## 2021-08-24 MED ORDER — POLYETHYLENE GLYCOL 3350 17 G PO PACK
17.0000 g | PACK | Freq: Every day | ORAL | Status: DC
  Administered 2021-08-27: 17 g via ORAL
  Filled 2021-08-24: qty 1

## 2021-08-24 MED ORDER — ORAL CARE MOUTH RINSE: 15.0000 mL | OROMUCOSAL | Status: DC | PRN

## 2021-08-24 MED ORDER — ORAL CARE MOUTH RINSE
15.0000 mL | OROMUCOSAL | Status: DC
  Administered 2021-08-24 (×3): 15 mL via OROMUCOSAL

## 2021-08-24 MED ORDER — LEVETIRACETAM 500 MG PO TABS
1000.0000 mg | ORAL_TABLET | Freq: Two times a day (BID) | ORAL | Status: DC
  Administered 2021-08-24 – 2021-08-27 (×6): 1000 mg via ORAL
  Filled 2021-08-24 (×6): qty 2

## 2021-08-24 MED ORDER — ENSURE ENLIVE PO LIQD
237.0000 mL | Freq: Two times a day (BID) | ORAL | Status: DC
  Administered 2021-08-25 – 2021-08-27 (×6): 237 mL via ORAL

## 2021-08-24 MED ORDER — OSMOLITE 1.5 CAL PO LIQD
996.0000 mL | ORAL | Status: DC
Start: 2021-08-24 — End: 2021-08-25
  Administered 2021-08-24: 996 mL
  Filled 2021-08-24 (×3): qty 1000

## 2021-08-24 MED ORDER — PANTOPRAZOLE SODIUM 40 MG PO TBEC
40.0000 mg | DELAYED_RELEASE_TABLET | Freq: Every day | ORAL | Status: DC
  Administered 2021-08-25: 40 mg via ORAL
  Filled 2021-08-24: qty 1

## 2021-08-24 NOTE — Telephone Encounter (Signed)
Called pt at [phone] since mother not on DPR and unable to speak with her. Had to LVM for him to call office back.

## 2021-08-24 NOTE — Progress Notes (Signed)
NAME:  OMARION MINNEHAN, MRN:  [id number], DOB:  12/20/1990, LOS: 3 ADMISSION DATE:  08/21/2021, CONSULTATION DATE: 08/21/2021 REFERRING MD: Dr. Janan Halter, CHIEF COMPLAINT: Seizures with concern of status epilepticus  History of Present Illness:  Cedarius Kersh is a 31 year old male with a past medical history of status epilepticus, seizures, and multiple sclerosis who presented to the ED via EMS due to seizures.  Per report family found patient in bed with seizure-like activity prompting EMS call.  Family is unsure length of time patient was seizing prior to being found.  On EMS arrival patient was seen post ictal but on ED arrival patient was again seizing.  Patient family states they have had issues getting maintenance Keppra refilled and has missed approximately 2 days of all antiepileptics.  On arrival to ED patient was intubated for airway protection and received 6 mg of Ativan and Keppra load prior to cessation of seizure-like activity.  Neurology was called and stat EEG ordered.  Blood work relatively unremarkable apart from leukocytosis with WBC 20.2.  Urinalysis normal.  Chest x-ray with patchy infiltrate left lower lung concerning for atelectasis versus possible aspiration pneumonia.  Patient febrile on admission. PCCM consulted for further management and admission.  Pertinent  Medical History  Prior status epilepticus Multiple sclerosis  Significant Hospital Events: Including procedures, antibiotic start and stop dates in addition to other pertinent events   7/29 admitted after being found seizing at home of note patient had missed 2 days of Keppra due to difficulty getting medication refilled.  Intubated in ED for airway protection 7/30 No acute issues overnight, no seizures on EEG   Interim History / Subjective:  Agitated off sedation.   Objective   Blood pressure 136/82, pulse (!) 105, temperature 100.2 F (37.9 C), temperature source Bladder, resp. rate (!) 23, height 5\' 9"   (1.753 m), weight 74.1 kg, SpO2 97 %.    Vent Mode: CPAP;PSV FiO2 (%):  [40 %] 40 % Set Rate:  [18 bmp] 18 bmp Vt Set:  [570 mL] 570 mL PEEP:  [5 cmH20] 5 cmH20 Pressure Support:  [5 cmH20-10 cmH20] 5 cmH20 Plateau Pressure:  [15 cmH20-21 cmH20] 21 cmH20   Intake/Output Summary (Last 24 hours) at 08/24/2021 0915 Last data filed at 08/24/2021 0800 Gross per 24 hour  Intake 2578.57 ml  Output 1175 ml  Net 1403.57 ml    Filed Weights   08/22/21 0500 08/23/21 0300 08/24/21 0400  Weight: 73.2 kg 76.5 kg 74.1 kg    Examination: General: Acute ill appearing adult male lying in bed on mechanical ventilation, in NAD HEENT: ETT, MM pink/moist, PERRL,  Neuro: off sedation, able follow commands and move all limbs.  CV: s1s2 regular rate and rhythm, no murmur, rubs, or gallops,  PULM:  Clear to ascultation, no increased work of breathing, no added breath sounds  GI: soft, bowel sounds active in all 4 quadrants, non-tender, non-distended Extremities: warm/dry, no edema  Skin: no rashes or lesions  Ancillary tests personally reviewed:     Assessment & Plan:  Status epilepticus (HCC) Currently no seizures.  No epileptiform activity on LTM Off sedation and following commands.   - Continue current levetiracetam  Acute respiratory failure with hypoxia (HCC) Passed brief SBT Chest CXR clear. Normal auscultation.   - Extubate today.  - Progressive mobilization.  - Bedside swallow evaluation  Aspiration pneumonia (HCC) - Complete 5 days of antibiotics.   Best Practice (right click and "Reselect all SmartList Selections" daily)   Diet/type: tubefeeds DVT  prophylaxis: prophylactic heparin  GI prophylaxis: PPI Lines: N/A Foley:  N/A Code Status:  full code Last date of multidisciplinary goals of care discussion:   CRITICAL CARE Performed by: Lynnell Catalan   Total critical care time: 35 minutes  Critical care time was exclusive of separately billable procedures and treating  other patients.  Critical care was necessary to treat or prevent imminent or life-threatening deterioration.  Critical care was time spent personally by me on the following activities: development of treatment plan with patient and/or surrogate as well as nursing, discussions with consultants, evaluation of patient's response to treatment, examination of patient, obtaining history from patient or surrogate, ordering and performing treatments and interventions, ordering and review of laboratory studies, ordering and review of radiographic studies, pulse oximetry, re-evaluation of patient's condition and participation in multidisciplinary rounds.  Lynnell Catalan, MD [facility] ICU Physician [facility] Critical [facility]  Pager: [phone] Mobile: [phone] After hours: [phone].   08/24/2021, 9:15 AM

## 2021-08-24 NOTE — Procedures (Signed)
Extubation Procedure Note  Patient Details:   Name: Miguel Black DOB: Sep 28, 1990 MRN: [id number]   Airway Documentation:    Vent end date: 08/24/21 Vent end time: 0750   Evaluation  O2 sats: stable throughout Complications: No apparent complications Patient did tolerate procedure well. Bilateral Breath Sounds: Diminished, Rhonchi   Yes  RT extubated patient to 4L [place] per MD order with RN at bedside. Positive cuff leak noted. Patient tolerated well. No stridor noted at this time. RT will continue to monitor as needed.   Jaquelyn Bitter 08/24/2021, 8:15 AM

## 2021-08-24 NOTE — Progress Notes (Signed)
Neurology Progress Note  Subjective: EEG discontinued 7/31 without further concern for seizures noted overnight Patient with reported asymptomatic bradycardia with heart rate in the 30's overnight Precedex gtt discontinued Unable to extubate 7/31 due to apnea with SBT, extubated 8/1 AM  Exam: Vitals:   08/24/21 0750 08/24/21 0800  BP:  136/82  Pulse: (!) 125 (!) 105  Resp: (!) 22 (!) 23  Temp: 100.2 F (37.9 C) 100.2 F (37.9 C)  SpO2: 99% 97%   Gen: Sitting up in ICU bed, in no acute distress. Endorses some throat soreness from ETT. Patient is calm and cooperative with exam.   Neuro:  MS: Patient is initially asleep on exam but wakes easily to voice. He follows commands without difficulty. He does state incorrectly that he is 31 years old and that the month is June. Speech is intact without dysarthria or aphasia.  CN: Pupils are equal and reactive and briskly reactive to light bilaterally, visual fields are full, EOMI, shoulder shrug is symmetric, tongue protrudes midline Motor: Moves all 4 extremities antigravity without vertical drift. Slight weakness noted to the left lower extremity from thigh to ankle, does not wiggle toes on the left (per mother, chronic due to MS).  Sensory: Sensation intact and symmetric to light touch throughout extremities Coordination: left lower extremity ataxia noted (per patient this is chronic secondary to MS, uses a cane at baseline for ambulation)  Pertinent Labs: Lab Results  Component Value Date   WBC 10.0 08/24/2021   HGB 12.0 (L) 08/24/2021   HCT 35.0 (L) 08/24/2021   MCV 93.1 08/24/2021   PLT 162 08/24/2021    Latest Reference Range & Units 08/23/21 01:03  Sodium 135 - 145 mmol/L 141  Potassium 3.5 - 5.1 mmol/L 3.7  Chloride 98 - 111 mmol/L 111  CO2 22 - 32 mmol/L 19 (L)  Glucose 70 - 99 mg/dL 85  BUN 6 - 20 mg/dL 9  Creatinine 3.41 - 9.62 mg/dL 2.29  Calcium 8.9 - 79.8 mg/dL 8.3 (L)  Anion gap 5 - 15  11  Phosphorus 2.5 - 4.6  mg/dL 2.2 (L)  Magnesium 1.7 - 2.4 mg/dL 2.1  GFR, Estimated >92 mL/min >60  (L): Data is abnormally low  Impression: 31 year old male who presented in status epilepticus in the setting of not being able to get his Keppra refilled.  Initial concern for persistent encephalopathy with significant improvement in mental status on examination 7/31 and 8/1. EEG without seizures or epileptiform discharges during hospitalization, discontinued 7/31. CT imaging 7/30 stable without acute abnormality.   Recommendations: - continue Keppra 1 g twice daily - continue seizure precautions - follow up with outpatient neurology as needed - No further inpatient neurology recommendations at this time, please call for further questions or concerns    Discussed [facility] statutes, patients with seizures are not allowed to drive until they have been seizure-free for six months. Use caution when using heavy equipment or power tools. Avoid working on ladders or at heights. Take showers instead of baths. Ensure the water temperature is not too high on the home water heater. Do not go swimming alone. Do not lock yourself in a room alone (i.e. bathroom). When caring for infants or small children, sit down when holding, feeding, or changing them to minimize risk of injury to the child in the event you have a seizure. Maintain good sleep hygiene. Avoid alcohol.   -- Lanae Boast, AGACNP-BC [facility] [phone]

## 2021-08-24 NOTE — Progress Notes (Signed)
Nutrition Follow-up  DOCUMENTATION CODES:   Not applicable  INTERVENTION:  - will adjust TF regimen:  Osmolite 1.5 @ 83 ml/hr x 12 hours  - this regimen will provide 1494 kcal, 62 grams protein, and 756 ml free water.  Encourage PO intake in hopes of d/c'ing cortrak tube and TF tomorrow  Ensure Enlive po BID, each supplement provides 350 kcal and 20 grams of protein.    NUTRITION DIAGNOSIS:   Inadequate oral intake related to acute illness, inability to eat as evidenced by NPO status.  GOAL:   Patient will meet greater than or equal to 90% of their needs  MONITOR:   Vent status, TF tolerance, Labs, Weight trends  REASON FOR ASSESSMENT:   Ventilator, Consult Enteral/tube feeding initiation and management  ASSESSMENT:   31 year old male with medical history of status epilepticus, seizures, and multiple sclerosis. He presented to the ED via EMS after family found him in bed with seizure-like activity. When EMS arrived, he was post ictal and when they arrived to the ED he was again having a seizure. His family reported having difficulty getting maintenance Keppra refilled and that he had missed 2 days of all anti-epileptics. In the ED, he was intubated for airway protection. Neurology was consulted and state EEG done. In the ED, CXR showed patchy infiltrate in left lower lung concerning for atelectasis versus possible aspiration pneumonia. Patient febrile on admission. He was admitted for status epilepticus.  Pt discussed during ICU rounds and with RN.  Pt extubated and passed bedside swallow eval and is now on a regular diet. However, pt did not remember if he had eaten lunch, spoke with RN who reports that pt refused lunch. Pt states he has tried ensure but could not tell me why he had tried them. Pt seemed pleasantly confused.  Cortrak remains in place; changed to nocturnal TF, hopeful to d/c tomorrow if pt will eat/take supplements.   Labs reviewed;  CBG's:  108-137  Medications reviewed; 100 mg colace BID, 40 mg protonix/day per tube, 17 g miralax/day   Diet Order:   Diet Order             Diet regular Room service appropriate? No; Fluid consistency: Thin  Diet effective now                   EDUCATION NEEDS:   No education needs have been identified at this time  Skin:  Skin Assessment: Reviewed RN Assessment  Last BM:  PTA/unknown  Height:   Ht Readings from Last 1 Encounters:  08/21/21 5\' 9"  (1.753 m)    Weight:   Wt Readings from Last 1 Encounters:  08/24/21 74.1 kg     BMI:  Body mass index is 24.12 kg/m.  Estimated Nutritional Needs:  Kcal:  2200-2400 kcal Protein:  110-125 grams Fluid:  >/= 2.2 L/day   10/24/21., RD, LDN, CNSC See AMiON for contact information

## 2021-08-24 NOTE — Progress Notes (Signed)
  Transition of Care [facility]) Screening Note   Patient Details  Name: Miguel Black Date of Birth: Feb 14, 1990   Transition of Care [facility]) CM/SW Contact:    Mearl Latin, LCSW Phone Number: 08/24/2021, 4:37 PM    Transition of Care Department [facility]) has reviewed patient and no TOC needs have been identified at this time. We will continue to monitor patient advancement through interdisciplinary progression rounds. If new patient transition needs arise, please place a TOC consult.

## 2021-08-24 NOTE — Telephone Encounter (Signed)
Pt's mother, Wayburn Shaler (not on DPR) asking nurse to call back to discuss why pt was noncompliant, need clarification.

## 2021-08-24 NOTE — Plan of Care (Signed)
Neurology will re-examine tmrw after patient is extubated.  Bing Neighbors, MD [facility] [phone]  If 7pm- 7am, please page neurology on call as listed in AMION.

## 2021-08-25 DIAGNOSIS — G40901 Epilepsy, unspecified, not intractable, with status epilepticus: Secondary | ICD-10-CM | POA: Diagnosis not present

## 2021-08-25 LAB — BASIC METABOLIC PANEL
Anion gap: 7 (ref 5–15)
BUN: 8 mg/dL (ref 6–20)
CO2: 25 mmol/L (ref 22–32)
Calcium: 8.4 mg/dL — ABNORMAL LOW (ref 8.9–10.3)
Chloride: 110 mmol/L (ref 98–111)
Creatinine, Ser: 0.83 mg/dL (ref 0.61–1.24)
GFR, Estimated: >60 mL/min (ref >60)
Glucose, Bld: 105 mg/dL — ABNORMAL HIGH (ref 70–99)
Potassium: 3.4 mmol/L — ABNORMAL LOW (ref 3.5–5.1)
Sodium: 142 mmol/L (ref 135–145)

## 2021-08-25 LAB — CBC
HCT: 33.7 % — ABNORMAL LOW (ref 39.0–52.0)
Hemoglobin: 11.6 g/dL — ABNORMAL LOW (ref 13.0–17.0)
MCH: 32.3 pg (ref 26.0–34.0)
MCHC: 34.4 g/dL (ref 30.0–36.0)
MCV: 93.9 fL (ref 80.0–100.0)
Platelets: 172 10*3/uL (ref 150–400)
RBC: 3.59 MIL/uL — ABNORMAL LOW (ref 4.22–5.81)
RDW: 13 % (ref 11.5–15.5)
WBC: 8.7 10*3/uL (ref 4.0–10.5)
nRBC: 0 % (ref 0.0–0.2)

## 2021-08-25 LAB — MAGNESIUM: Magnesium: 1.9 mg/dL (ref 1.7–2.4)

## 2021-08-25 LAB — TRIGLYCERIDES: Triglycerides: 91 mg/dL (ref <150)

## 2021-08-25 LAB — PHOSPHORUS: Phosphorus: 3.8 mg/dL (ref 2.5–4.6)

## 2021-08-25 MED ORDER — POTASSIUM CHLORIDE CRYS ER 20 MEQ PO TBCR
40.0000 meq | EXTENDED_RELEASE_TABLET | Freq: Once | ORAL | Status: AC
Start: 2021-08-25 — End: 2021-08-25
  Administered 2021-08-25: 40 meq via ORAL
  Filled 2021-08-25: qty 2

## 2021-08-25 MED ORDER — ORAL CARE MOUTH RINSE: 15.0000 mL | OROMUCOSAL | Status: DC | PRN

## 2021-08-25 NOTE — Progress Notes (Signed)
Inpatient Rehab Admissions Coordinator:   Per therapy recommendations, patient was screened for CIR candidacy by Megan Salon, MS, CCC-SLP. At this time, Pt. does not appear to demonstrate medical necessity to justify in hospital rehabilitation/CIR. Additionally, Pt.'s insurance is not in network with CIR, so if AIR level of care is desired, TOC will need to look at other AIRs. I  will not pursue a rehab consult for this Pt.   Recommend other rehab venues to be pursued.  Please contact me with any questions.  Megan Salon, MS, CCC-SLP Rehab Admissions Coordinator  [phone] (celll) [phone] (office)

## 2021-08-25 NOTE — Progress Notes (Addendum)
Neurology Progress Note  Subjective: Patient is awake and alert laying in the bed in NAD. He was extubated yesterday, doing well on RA. He states he is feeling pretty good. Was able to eat some food this morning. He is oriented to self, place and year. Follows commands and moving all extremities.   Exam: Vitals:   08/25/21 0900 08/25/21 1000  BP: (!) 150/97 (!) 116/94  Pulse: 78 70  Resp: 18 (!) 21  Temp:    SpO2: 97% 94%   Gen: adult male in NAD  Neuro:  MS: Alert and oriented x 3. Speech is clear, no dysarthria or aphasia noted  CN: Pupils are equal and reactive and briskly reactive to light bilaterally, tracks, tongue midline, no facial droop Motor: Moves all 4 extremities antigravity without vertical drift. Left side weakness of arm and leg due to his MS, he states this is chronic Sensory: Sensation intact and symmetric to light touch throughout extremities  Pertinent Labs: Lab Results  Component Value Date   WBC 8.7 08/25/2021   HGB 11.6 (L) 08/25/2021   HCT 33.7 (L) 08/25/2021   MCV 93.9 08/25/2021   PLT 172 08/25/2021    Impression: 31 year old male who presented in status epilepticus in the setting of not being able to get his Keppra refilled.  Initial concern for persistent encephalopathy with significant improvement in mental status on examination today. EEG without seizures or epileptiform discharges 7/29-7/31, will discontinue. CT imaging 7/30 stable without acute abnormality.   Recommendations: - continue Keppra 1 g twice daily - Per [place] law patient may not drive for 6 mos after last seizure - Will need follow up with outpatient neurology - Neurology will sign off. Please call with questions or concerns  Gevena Mart DNP, ACNPC-AG  Neurology Attending Attestation: I discussed the plan with Ms. Wolfe NP and agree with the findings and recommendations above. Neuro to sign off, but please re-engage if any new neurologic concerns arise.  Bing Neighbors, MD [facility] [phone]  If 7pm- 7am, please page neurology on call as listed in AMION.

## 2021-08-25 NOTE — Evaluation (Signed)
Physical Therapy Evaluation Patient Details Name: Miguel Black MRN: [id number] DOB: [birth year] Today's Date: 08/25/2021  History of Present Illness  Pt is a 31 y/o male admitted 7/29 secondary to seizures. Pt intubated on 7/29 and extubated on 8/1. PMH includes MS and seizures.  Clinical Impression  Pt admitted secondary to problem above with deficits below. Pt requiring mod A +2 for mobility this session using RW. Very poor insight into safety and deficits. LLE weakness and ataxia throughout and tended to drift to the R. Physical assist required to manage RW as well. Pt reports he was previously mod I with use of cane. Recommending AIR level therapies to increase independence and safety. Will continue to follow acutely.        Recommendations for follow up therapy are one component of a multi-disciplinary discharge planning process, led by the attending physician.  Recommendations may be updated based on patient status, additional functional criteria and insurance authorization.  Follow Up Recommendations Acute inpatient rehab (3hours/day)      Assistance Recommended at Discharge Frequent or constant Supervision/Assistance  Patient can return home with the following  Two people to help with walking and/or transfers;Two people to help with bathing/dressing/bathroom;Assistance with cooking/housework;Help with stairs or ramp for entrance;Assist for transportation    Equipment Recommendations Wheelchair cushion (measurements PT);Wheelchair (measurements PT)  Recommendations for Other Services  Rehab consult    Functional Status Assessment Patient has had a recent decline in their functional status and demonstrates the ability to make significant improvements in function in a reasonable and predictable amount of time.     Precautions / Restrictions Precautions Precautions: Fall Restrictions Weight Bearing Restrictions: No      Mobility  Bed Mobility Overal bed mobility: Needs  Assistance Bed Mobility: Supine to Sit     Supine to sit: Mod assist     General bed mobility comments: Assist for trunk to come to sitting.    Transfers Overall transfer level: Needs assistance Equipment used: Rolling walker (2 wheels) Transfers: Sit to/from Stand Sit to Stand: Mod assist, +2 physical assistance, +2 safety/equipment           General transfer comment: Mod A for lift assist and steadying to stand.    Ambulation/Gait Ambulation/Gait assistance: Mod assist, +2 physical assistance, +2 safety/equipment Gait Distance (Feet): 40 Feet Assistive device: Rolling walker (2 wheels) Gait Pattern/deviations: Step-through pattern, Decreased stride length, Knees buckling, Ataxic Gait velocity: Decreased     General Gait Details: Very unsteady and LLE very ataxic throughout. Physical assist required to manage RW for proximity to device. Pt also tending to vear towards the R during ambulation and unable to correct. L knee buckling noted as well.  Stairs            Wheelchair Mobility    Modified Rankin (Stroke Patients Only)       Balance Overall balance assessment: Needs assistance Sitting-balance support: Feet supported, Bilateral upper extremity supported Sitting balance-Leahy Scale: Poor Sitting balance - Comments: Reliant on BUE support   Standing balance support: Bilateral upper extremity supported Standing balance-Leahy Scale: Poor Standing balance comment: Reliant on UE and external support                             Pertinent Vitals/Pain Pain Assessment Pain Assessment: No/denies pain    Home Living Family/patient expects to be discharged to:: Private residence Living Arrangements: Parent Available Help at Discharge: Family;Available 24 hours/day Type of Home: Apartment  Home Access: Stairs to enter Entrance Stairs-Rails: Right;Left;Can reach both Entrance Stairs-Number of Steps: 5 Alternate Level Stairs-Number of Steps:  flight Home Layout: Two level Home Equipment: Agricultural consultant (2 wheels);Cane - single point;Shower seat;Grab bars - tub/shower      Prior Function Prior Level of Function : Independent/Modified Independent             Mobility Comments: Pt reports using cane for ambulation       Hand Dominance   Dominant Hand: Left    Extremity/Trunk Assessment   Upper Extremity Assessment Upper Extremity Assessment: Defer to OT evaluation    Lower Extremity Assessment Lower Extremity Assessment: LLE deficits/detail LLE Deficits / Details: Grossly 1/5 in ankle DF. 3-/5 throughout remainder of muscle groups.    Cervical / Trunk Assessment Cervical / Trunk Assessment: Normal  Communication      Cognition Arousal/Alertness: Awake/alert Behavior During Therapy: WFL for tasks assessed/performed Overall Cognitive Status: No family/caregiver present to determine baseline cognitive functioning                                 General Comments: Decreased safety awareness noted and difficulty sequencing. Almost childlike in nature. Multile cues for safety and sequencing throughout.        General Comments      Exercises     Assessment/Plan    PT Assessment Patient needs continued PT services  PT Problem List Decreased strength;Decreased range of motion;Decreased activity tolerance;Decreased balance;Decreased mobility;Decreased cognition;Decreased knowledge of use of DME;Decreased coordination;Decreased safety awareness;Decreased knowledge of precautions       PT Treatment Interventions DME instruction;Gait training;Functional mobility training;Stair training;Therapeutic activities;Therapeutic exercise;Balance training;Patient/family education    PT Goals (Current goals can be found in the Care Plan section)  Acute Rehab PT Goals Patient Stated Goal: to go home PT Goal Formulation: With patient Time For Goal Achievement: 09/08/21 Potential to Achieve Goals: Good     Frequency Min 3X/week     Co-evaluation PT/OT/SLP Co-Evaluation/Treatment: Yes Reason for Co-Treatment: For patient/therapist safety;To address functional/ADL transfers PT goals addressed during session: Mobility/safety with mobility;Balance         AM-PAC PT "6 Clicks" Mobility  Outcome Measure Help needed turning from your back to your side while in a flat bed without using bedrails?: A Little Help needed moving from lying on your back to sitting on the side of a flat bed without using bedrails?: A Lot Help needed moving to and from a bed to a chair (including a wheelchair)?: Total Help needed standing up from a chair using your arms (e.g., wheelchair or bedside chair)?: Total Help needed to walk in hospital room?: Total Help needed climbing 3-5 steps with a railing? : Total 6 Click Score: 9    End of Session Equipment Utilized During Treatment: Gait belt Activity Tolerance: Patient tolerated treatment well Patient left: in chair;with call bell/phone within reach;with chair alarm set Nurse Communication: Mobility status PT Visit Diagnosis: Unsteadiness on feet (R26.81);Muscle weakness (generalized) (M62.81);Difficulty in walking, not elsewhere classified (R26.2)    Time: 9381-0175 PT Time Calculation (min) (ACUTE ONLY): 18 min   Charges:   PT Evaluation $PT Eval Moderate Complexity: 1 Mod          Farley Ly, PT, DPT  Acute Rehabilitation Services  Office: [phone]   Lehman Prom 08/25/2021, 11:47 AM

## 2021-08-25 NOTE — Progress Notes (Signed)
NAME:  Miguel Black, MRN:  [id number], DOB:  Aug 15, 1990, LOS: 4 ADMISSION DATE:  08/21/2021, CONSULTATION DATE: 08/21/2021 REFERRING MD: Dr. Janan Halter, CHIEF COMPLAINT: Seizures with concern of status epilepticus  History of Present Illness:  Miguel Black is a 31 year old male with a past medical history of status epilepticus, seizures, and multiple sclerosis who presented to the ED via EMS due to seizures.  Per report family found patient in bed with seizure-like activity prompting EMS call.  Family is unsure length of time patient was seizing prior to being found.  On EMS arrival patient was seen post ictal but on ED arrival patient was again seizing.  Patient family states they have had issues getting maintenance Keppra refilled and has missed approximately 2 days of all antiepileptics.  On arrival to ED patient was intubated for airway protection and received 6 mg of Ativan and Keppra load prior to cessation of seizure-like activity.  Neurology was called and stat EEG ordered.  Blood work relatively unremarkable apart from leukocytosis with WBC 20.2.  Urinalysis normal.  Chest x-ray with patchy infiltrate left lower lung concerning for atelectasis versus possible aspiration pneumonia.  Patient febrile on admission. PCCM consulted for further management and admission.  Pertinent  Medical History  Prior status epilepticus Multiple sclerosis  Significant Hospital Events: Including procedures, antibiotic start and stop dates in addition to other pertinent events   7/29 admitted after being found seizing at home of note patient had missed 2 days of Keppra due to difficulty getting medication refilled.  Intubated in ED for airway protection 7/30 No acute issues overnight, no seizures on EEG  8/1 extubated  Interim History / Subjective:  Extubated successfully yesterday. Seen by PT who recommended CIR. CIR deemed him to not be a candidate for admission. TOC placed for assistance. Pt  does state he has balance problems at home and uses a cane for mobilization due to underlying MS.  Objective   Blood pressure 135/89, pulse (!) 57, temperature 98.9 F (37.2 C), temperature source Oral, resp. rate (!) 21, height 5\' 9"  (1.753 m), weight 73.8 kg, SpO2 98 %.        Intake/Output Summary (Last 24 hours) at 08/25/2021 1258 Last data filed at 08/25/2021 0800 Gross per 24 hour  Intake 1011.21 ml  Output 750 ml  Net 261.21 ml    Filed Weights   08/23/21 0300 08/24/21 0400 08/25/21 0500  Weight: 76.5 kg 74.1 kg 73.8 kg    Examination: General: Adult male, resting in bed, in NAD. Neuro: A&O x 3, no deficits. HEENT: [place]/AT. Sclerae anicteric. EOMI. Cardiovascular: RRR, no M/R/G.  Lungs: Respirations even and unlabored.  CTA bilaterally, No W/R/R. Abdomen: BS x 4, soft, NT/ND.  Musculoskeletal: No gross deformities, no edema.  Skin: Intact, warm, no rashes.    Assessment & Plan:  Status epilepticus (HCC) Currently no seizures.  No epileptiform activity on LTM Off sedation and following commands.   - Continue current levetiracetam  Acute respiratory failure with hypoxia (HCC) Passed brief SBT Chest CXR clear. Normal auscultation.   - Extubate today.  - Progressive mobilization.  - Bedside swallow evaluation  Aspiration pneumonia (HCC) - Complete 5 days of antibiotics.    Status Epilepticus - no epileptiform activity on LTM. Hx Seizures. - Continue Keppra per neuro. - Close outpatient follow up.  Concern for aspiration on admit - s/p 3 days Unasyn. Currently afebrile and asymptomatic. - Supportive care. - Bronchial hygiene.  Hypokalemia - s/p repletion. - Follow BMP.  Hx MS. - F/u as outpatient.  Mobility / Conditioning - evaluated by PT who recommended CIR vs acute rehab. Screened by CIR and unfortunately not a candidate. - TOC placed for assistance with acute rehab given his balance and mobility problems (he states he does use a cane at baseline  and does suffer from imbalance etc).  Stable for transfer out of ICU. Will ask TRH to assume care in AM 8/3 unless he is ready for discharge at which point PCCM will remain as primary and complete discharge. Please call us back 8/2  if this is the case.  Best Practice (right click and "Reselect all SmartList Selections" daily)   Diet/type: Regular consistency (see orders) DVT prophylaxis: prophylactic heparin  GI prophylaxis: N/A Lines: N/A Foley:  N/A Code Status:  full code Last date of multidisciplinary goals of care discussion:     Rutherford Guys, PA - [facility] For pager details, please see AMION or use Epic chat  After 1900, please call [facility] for cross coverage needs 08/25/2021, 1:05 PM

## 2021-08-25 NOTE — Evaluation (Addendum)
Occupational Therapy Evaluation Patient Details Name: Miguel Black MRN: [id number] DOB: November 19, 1990 Today's Date: 08/25/2021   History of Present Illness Pt is a 31 y/o male admitted 7/29 secondary to seizures. Pt intubated on 7/29 and extubated on 8/1. PMH includes MS and seizures.   Clinical Impression   [name] was evaluated s/p the above admission list, he reports mod I at baseline and lives with his parents who can assist 24/7 at d/c. Upon evaluation pt ws limited by L weakness (LHD), poor balance, decreased activity tolerance, and impaired cognition. He required mod A for bed mobility and mod A +2 for transfers and functional mobility. Pt had extremely poor management of RW and needed maximal cues and physical assist for safety with not carryover of cues noted. Pt also had a severe R drift during ambulation with no attempt to self-correct. Due to deficits he requires min A for UB ADLs, and max A for LB ADLs. Pt will benefit from OT acutely. Recommend AIR for maximal functional recovery towards mod I prior to d/c.   Recommend SLP cognitive evaluation due to impaired command following, attention, problem solving, insight, and safety awareness this session.      Recommendations for follow up therapy are one component of a multi-disciplinary discharge planning process, led by the attending physician.  Recommendations may be updated based on patient status, additional functional criteria and insurance authorization.   Follow Up Recommendations  Acute inpatient rehab (3hours/day)    Assistance Recommended at Discharge Frequent or constant Supervision/Assistance  Patient can return home with the following A lot of help with walking and/or transfers;A lot of help with bathing/dressing/bathroom;Assistance with cooking/housework;Direct supervision/assist for medications management;Direct supervision/assist for financial management;Assist for transportation;Help with stairs or ramp for entrance     Functional Status Assessment  Patient has had a recent decline in their functional status and demonstrates the ability to make significant improvements in function in a reasonable and predictable amount of time.  Equipment Recommendations  None recommended by OT    Recommendations for Other Services Speech consult     Precautions / Restrictions Precautions Precautions: Fall Restrictions Weight Bearing Restrictions: No      Mobility Bed Mobility Overal bed mobility: Needs Assistance Bed Mobility: Supine to Sit     Supine to sit: Mod assist     General bed mobility comments: Assist for trunk to come to sitting.    Transfers Overall transfer level: Needs assistance Equipment used: Rolling walker (2 wheels) Transfers: Sit to/from Stand Sit to Stand: Mod assist, +2 physical assistance, +2 safety/equipment           General transfer comment: Mod A for lift assist and steadying to stand.      Balance Overall balance assessment: Needs assistance Sitting-balance support: Feet supported, Bilateral upper extremity supported Sitting balance-Leahy Scale: Poor Sitting balance - Comments: Reliant on BUE support   Standing balance support: Bilateral upper extremity supported Standing balance-Leahy Scale: Poor Standing balance comment: Reliant on UE and external support                           ADL either performed or assessed with clinical judgement   ADL Overall ADL's : Needs assistance/impaired Eating/Feeding: Minimal assistance;Sitting   Grooming: Minimal assistance;Sitting   Upper Body Bathing: Minimal assistance;Sitting   Lower Body Bathing: Maximal assistance;Sit to/from stand   Upper Body Dressing : Minimal assistance;Sitting   Lower Body Dressing: Maximal assistance;Sit to/from stand   Toilet Transfer:  Moderate assistance;+2 for physical assistance;+2 for safety/equipment;Ambulation;Rolling walker (2 wheels)   Toileting- Clothing Manipulation  and Hygiene: Maximal assistance;Sit to/from stand       Functional mobility during ADLs: Moderate assistance       Vision Baseline Vision/History: 0 No visual deficits Vision Assessment?: Vision impaired- to be further tested in functional context Additional Comments: Pt states his L eye has been "blurry" since his MS dx     Perception     Praxis      Pertinent Vitals/Pain Pain Assessment Pain Assessment: No/denies pain     Hand Dominance Left   Extremity/Trunk Assessment Upper Extremity Assessment Upper Extremity Assessment: RUE deficits/detail;LUE deficits/detail RUE Deficits / Details: 5/5 MMT and full ROM LUE Deficits / Details: 4/5 globally through arm. slow and deliberate coordination. Paraesthesias distally. Full ROM. Reuqired cue for hand placement on RW LUE Sensation: decreased light touch;decreased proprioception LUE Coordination: decreased fine motor   Lower Extremity Assessment Lower Extremity Assessment: Defer to PT evaluation LLE Deficits / Details: Grossly 1/5 in ankle DF. 3-/5 throughout remainder of muscle groups.   Cervical / Trunk Assessment Cervical / Trunk Assessment: Normal   Communication Communication Communication: No difficulties   Cognition Arousal/Alertness: Awake/alert Behavior During Therapy: WFL for tasks assessed/performed Overall Cognitive Status: No family/caregiver present to determine baseline cognitive functioning                                 General Comments: very poor safety awareness and insight to deficits. No problem solving noted to increase safety. Required maximal repetitive cues throughout with no carryover.     General Comments  VSS onRA    Exercises     Shoulder Instructions      Home Living Family/patient expects to be discharged to:: Private residence Living Arrangements: Parent Available Help at Discharge: Family;Available 24 hours/day Type of Home: Apartment Home Access: Stairs to  enter Entrance Stairs-Number of Steps: 5 Entrance Stairs-Rails: Right;Left;Can reach both Home Layout: Two level Alternate Level Stairs-Number of Steps: flight Alternate Level Stairs-Rails: Left Bathroom Shower/Tub: Chief Strategy Officer: Standard     Home Equipment: Agricultural consultant (2 wheels);Cane - single point;Shower seat;Grab bars - tub/shower          Prior Functioning/Environment Prior Level of Function : Independent/Modified Independent             Mobility Comments: Pt reports using cane for ambulation ADLs Comments: independent        OT Problem List: Decreased strength;Decreased range of motion;Decreased activity tolerance;Impaired balance (sitting and/or standing);Decreased coordination;Decreased cognition;Decreased safety awareness;Decreased knowledge of precautions;Decreased knowledge of use of DME or AE;Pain;Impaired sensation      OT Treatment/Interventions: Self-care/ADL training;Therapeutic exercise;DME and/or AE instruction;Therapeutic activities;Patient/family education;Balance training    OT Goals(Current goals can be found in the care plan section) Acute Rehab OT Goals Patient Stated Goal: home OT Goal Formulation: With patient Time For Goal Achievement: 09/08/21 Potential to Achieve Goals: Good ADL Goals Pt Will Perform Grooming: with modified independence;standing Pt Will Perform Upper Body Dressing: with modified independence;sitting Pt Will Perform Lower Body Dressing: with supervision;sit to/from stand Pt Will Transfer to Toilet: with supervision;ambulating Additional ADL Goal #1: pt will indep complete IADL medication management task  OT Frequency: Min 2X/week    Co-evaluation PT/OT/SLP Co-Evaluation/Treatment: Yes Reason for Co-Treatment: For patient/therapist safety;To address functional/ADL transfers;Complexity of the patient's impairments (multi-system involvement) PT goals addressed during session: Mobility/safety with  mobility;Balance  OT goals addressed during session: ADL's and self-care      AM-PAC OT "6 Clicks" Daily Activity     Outcome Measure Help from another person eating meals?: A Little Help from another person taking care of personal grooming?: A Little Help from another person toileting, which includes using toliet, bedpan, or urinal?: A Lot Help from another person bathing (including washing, rinsing, drying)?: A Lot Help from another person to put on and taking off regular upper body clothing?: A Little Help from another person to put on and taking off regular lower body clothing?: A Lot 6 Click Score: 15   End of Session Equipment Utilized During Treatment: Gait belt;Rolling walker (2 wheels) Nurse Communication: Mobility status  Activity Tolerance: Patient tolerated treatment well Patient left: in chair;with call bell/phone within reach;with chair alarm set  OT Visit Diagnosis: Unsteadiness on feet (R26.81);Other abnormalities of gait and mobility (R26.89);Muscle weakness (generalized) (M62.81)                Time: 7654-6503 OT Time Calculation (min): 18 min Charges:  OT General Charges $OT Visit: 1 Visit OT Evaluation $OT Eval Moderate Complexity: 1 Mod    Arnett Galindez A Gian Ybarra 08/25/2021, 2:16 PM

## 2021-08-25 NOTE — TOC Initial Note (Signed)
Transition of Care [facility]) - Initial/Assessment Note    Patient Details  Name: Miguel Black MRN: [id number] Date of Birth: 11-06-1990  [facility] of [facility]) CM/SW Contact:    Glennon Mac, RN Phone Number: 08/25/2021, 4:16 PM  Clinical Narrative:                 Pt is a 31 y/o male admitted 7/29 secondary to seizures. Pt intubated on 7/29 and extubated on 8/1.  PTA, pt independent with assistive devices; lives at home with parents.  PT/OT recommending inpatient rehab.  Pt's insurance out of network for CIR.  I spoke with pt's mother, Miguel Black, to discuss therapy recommendations, and to discuss alternative venues for CIR.  She states that pt/family prefer dc home with OP therapies, as he has had in the past. TOC will follow for progress with therapies.   Expected Discharge Plan: OP Rehab Barriers to Discharge: Continued Medical Work up   Patient Goals and CMS Choice Patient states their goals for this hospitalization and ongoing recovery are:: to go home      Expected Discharge Plan and Services Expected Discharge Plan: OP Rehab   Discharge Planning Services: CM Consult   Living arrangements for the past 2 months: Apartment                                      Prior Living Arrangements/Services Living arrangements for the past 2 months: Apartment Lives with:: Parents Patient language and need for interpreter reviewed:: Yes        Need for Family Participation in Patient Care: Yes (Comment) Care giver support system in place?: Yes (comment)   Criminal Activity/Legal Involvement Pertinent to Current Situation/Hospitalization: No - Comment as needed  Activities of Daily Living      Permission Sought/Granted   Permission granted to share information with : Yes, Verbal Permission Granted  Share Information with NAME: Miguel Black     Permission granted to share info w Relationship: mother  Permission granted to share info w Contact Information:  [phone]  Emotional Assessment Appearance:: Appears stated age Attitude/Demeanor/Rapport: Guarded Affect (typically observed): Appropriate Orientation: : Oriented to Self, Oriented to Place, Oriented to  Time, Oriented to Situation      Admission diagnosis:  Status epilepticus (HCC) [G40.901] Patient Active Problem List   Diagnosis Date Noted   Acute respiratory failure with hypoxia (HCC) 08/23/2021   Status epilepticus (HCC) 08/21/2021   History of optic neuritis 07/29/2020   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   High risk medication use 01/28/2020   Seizure disorder (HCC) 12/04/2019   Marijuana use 12/04/2019   Seizures (HCC) 01/22/2019   Gait disturbance 01/22/2019   Left-sided weakness 01/22/2019   Other fatigue 01/22/2019   Aspiration pneumonia (HCC) 07/12/2018   Relapsing remitting multiple sclerosis (HCC) 07/12/2018   Encounter for screening for HIV 07/12/2018   PCP:  Pcp, No Pharmacy:   [id number] [place], [place] - [address] [place] [address] Phone: [phone] Fax: [phone]  [facility] [place], [place] - [address] [place] [address] Phone: [phone] Fax: [phone]     Social Determinants of Health (SDOH) Interventions    Readmission Risk Interventions     No data to display         Quintella Baton, RN, BSN  Trauma/Neuro ICU Case  Manager [phone]

## 2021-08-25 NOTE — Progress Notes (Signed)
Nutrition Follow-up  DOCUMENTATION CODES:   Not applicable  INTERVENTION:   Encourage PO intake; per   Ensure Enlive po BID, each supplement provides 350 kcal and 20 grams of protein.    NUTRITION DIAGNOSIS:   Inadequate oral intake related to acute illness as evidenced by meal completion < 25%. Ongoing.   GOAL:   Patient will meet greater than or equal to 90% of their needs Progressing.   MONITOR:   PO intake, Supplement acceptance  REASON FOR ASSESSMENT:   Ventilator, Consult Enteral/tube feeding initiation and management  ASSESSMENT:   31 year old male with medical history of status epilepticus, seizures, and multiple sclerosis. He presented to the ED via EMS after family found him in bed with seizure-like activity. When EMS arrived, he was post ictal and when they arrived to the ED he was again having a seizure. His family reported having difficulty getting maintenance Keppra refilled and that he had missed 2 days of all anti-epileptics. In the ED, he was intubated for airway protection. Neurology was consulted and state EEG done. In the ED, CXR showed patchy infiltrate in left lower lung concerning for atelectasis versus possible aspiration pneumonia. Patient febrile on admission. He was admitted for status epilepticus.  Pt discussed during ICU rounds and with RN and MD. Pt did not eat yesterday. Per RN pt is drinking some of his ensure today.  Pt unable to go to AIR. TOC following.   8/1 extubated; diet advanced to Regular 8/2 tube d/c'ed  Labs reviewed;  CBG's: 108-137  Medications reviewed; 100 mg colace BID, 40 mg protonix/day per tube, 17 g miralax/day   Diet Order:   Diet Order             Diet regular Room service appropriate? No; Fluid consistency: Thin  Diet effective now                   EDUCATION NEEDS:   No education needs have been identified at this time  Skin:  Skin Assessment: Reviewed RN Assessment  Last BM:  8/2  Height:    Ht Readings from Last 1 Encounters:  08/21/21 5\' 9"  (1.753 m)    Weight:   Wt Readings from Last 1 Encounters:  08/25/21 73.8 kg     BMI:  Body mass index is 24.03 kg/m.  Estimated Nutritional Needs:  Kcal:  2200-2400 kcal Protein:  110-125 grams Fluid:  >/= 2.2 L/day   10/25/21., RD, LDN, CNSC See AMiON for contact information

## 2021-08-25 NOTE — Progress Notes (Signed)
Pharmacy Electrolyte Replacement  Recent Labs:  Recent Labs    08/25/21 0712  K 3.4*  MG 1.9  PHOS 3.8  CREATININE 0.83    Low Critical Values (K </= 2.5, Phos </= 1, Mg </= 1) Present: None    Plan: Replete potassium with of oral potassium chloride.

## 2021-08-26 DIAGNOSIS — G40901 Epilepsy, unspecified, not intractable, with status epilepticus: Secondary | ICD-10-CM | POA: Diagnosis not present

## 2021-08-26 LAB — CBC
HCT: 33.7 % — ABNORMAL LOW (ref 39.0–52.0)
Hemoglobin: 11.5 g/dL — ABNORMAL LOW (ref 13.0–17.0)
MCH: 32.1 pg (ref 26.0–34.0)
MCHC: 34.1 g/dL (ref 30.0–36.0)
MCV: 94.1 fL (ref 80.0–100.0)
Platelets: 169 10*3/uL (ref 150–400)
RBC: 3.58 MIL/uL — ABNORMAL LOW (ref 4.22–5.81)
RDW: 12.8 % (ref 11.5–15.5)
WBC: 7.4 10*3/uL (ref 4.0–10.5)
nRBC: 0 % (ref 0.0–0.2)

## 2021-08-26 LAB — BASIC METABOLIC PANEL
Anion gap: 9 (ref 5–15)
BUN: 6 mg/dL (ref 6–20)
CO2: 20 mmol/L — ABNORMAL LOW (ref 22–32)
Calcium: 8.3 mg/dL — ABNORMAL LOW (ref 8.9–10.3)
Chloride: 109 mmol/L (ref 98–111)
Creatinine, Ser: 0.76 mg/dL (ref 0.61–1.24)
GFR, Estimated: >60 mL/min (ref >60)
Glucose, Bld: 101 mg/dL — ABNORMAL HIGH (ref 70–99)
Potassium: 3.4 mmol/L — ABNORMAL LOW (ref 3.5–5.1)
Sodium: 138 mmol/L (ref 135–145)

## 2021-08-26 LAB — PHOSPHORUS: Phosphorus: 3.8 mg/dL (ref 2.5–4.6)

## 2021-08-26 LAB — MAGNESIUM: Magnesium: 1.8 mg/dL (ref 1.7–2.4)

## 2021-08-26 MED ORDER — POTASSIUM CHLORIDE CRYS ER 20 MEQ PO TBCR
40.0000 meq | EXTENDED_RELEASE_TABLET | Freq: Once | ORAL | Status: AC
  Administered 2021-08-26: 40 meq via ORAL
  Filled 2021-08-26: qty 2

## 2021-08-26 NOTE — Evaluation (Signed)
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   High risk medication use 01/28/2020   Seizure disorder (HCC) 12/04/2019   Marijuana use 12/04/2019   Seizures (HCC) 01/22/2019   Gait disturbance 01/22/2019   Left-sided weakness 01/22/2019   Other fatigue 01/22/2019   Aspiration pneumonia (HCC) 07/12/2018   Relapsing remitting multiple sclerosis (HCC) 07/12/2018   Encounter for screening for HIV 07/12/2018   Past Medical History:  Past Medical History:  Diagnosis Date   MS (multiple sclerosis) (HCC)    Seizure (HCC)    Status epilepticus (HCC) 07/12/2018   Past Surgical History:  Past Surgical History:  Procedure Laterality Date   NO PAST SURGERIES     HPI:  Pt is a 31 y/o male admitted 7/29 secondary to seizures. Pt intubated on 7/29 and extubated on 8/1. PMH includes MS and seizures.   Assessment / Plan / Recommendation Clinical Impression  Pt presents with acute changes in memory. He participated in most of the SLUMS (he deferred written portion at this time), scoring 11 out of an adjusted possible 24 points. Throguhout testing he did benefit from trial of therapy in which SLP provided reinforcement and cues for recall. Otherwise, demonstrating some difficulty with retrieval, working memory, and processing. His mother arrived toward the end of the session, and she believes that he has primarily returned to his baseline except for his memory. They are hopeful that this will continue to improve, as it has in the past after seizures, but are agreeable to SLP f/u to facilitate.     SLP Assessment  SLP Recommendation/Assessment: Patient needs continued Speech Lanaguage Pathology Services SLP Visit Diagnosis: Cognitive communication deficit (R41.841)    Recommendations for follow up therapy are one component of a multi-disciplinary discharge planning process, led by the attending physician.  Recommendations may be updated based on patient status, additional functional criteria and insurance authorization.    Follow Up Recommendations  Outpatient SLP (per mom's request)    Assistance Recommended at Discharge  Frequent or constant Supervision/Assistance  Functional Status Assessment Patient has had a recent decline in their functional status and demonstrates the ability to make significant improvements in function in a reasonable and predictable amount of time.  Frequency and Duration min 2x/week  1 week      SLP Evaluation Cognition  Overall Cognitive Status: Impaired/Different from baseline Orientation Level: Oriented to person;Oriented to place;Oriented to situation;Disoriented to time Attention: Selective Selective Attention: Impaired Selective Attention Impairment: Verbal basic Memory: Impaired Memory Impairment: Storage deficit;Retrieval deficit;Decreased recall of new information Problem Solving: Impaired Problem Solving Impairment: Verbal basic;Verbal complex Safety/Judgment: Impaired       Comprehension  Auditory Comprehension Overall Auditory Comprehension: Impaired Commands: Within Functional Limits Conversation: Simple Interfering Components: Processing speed;Working memory EffectiveTechniques: Repetition;Slowed speech    Expression Expression Primary Mode of Expression: Verbal Verbal Expression Overall Verbal Expression: Appears within functional limits for tasks assessed   Oral / Motor  Motor Speech Overall Motor Speech: Impaired at baseline (slow, a little monotone)            Mahala Menghini., M.A. CCC-SLP Acute Rehabilitation  Services Office [phone]  Secure chat  preferred  08/26/2021, 2:48 PM

## 2021-08-26 NOTE — Progress Notes (Signed)
PROGRESS NOTE    Miguel Black  JGG:[id number] DOB: Aug 27, 1990 DOA: 08/21/2021 PCP: Pcp, No    Brief Narrative:  31 year old gentleman with history of status epilepticus and multiple sclerosis lives at home with parents brought to the ER with seizure.  He was found by family members in the bed with seizure-like activity, he was postictal with EMS and started seizing again at the emergency room.  Intubated for airway protection, treated with IV Ativan and Keppra loading with cessation of seizure activities.  In the emergency room unresponsive, leukocytosis, urine normal.  Chest x-ray with patchy infiltrate left lower lung.  Febrile on admission. 7/29, intubated and admitted to the neuro ICU and treated with Keppra and Ativan. 8/1, extubated to room air.   Assessment & Plan:   Status epilepticus in a patient with known seizure, breakthrough seizure suspected due to missed Keppra: Medically stabilized.  No epileptiform activity on long-term monitoring.  Off sedation and following commands. Neurology recommended to continue Keppra 1 g twice daily. All-time seizure precautions. He does not drive due to baseline balance problems, seizure precautions advised.  Acute hypoxemic respiratory failure secondary to postictal state: Improved to baseline.  Extubated to room air.  Continue progressive mobilization. Initially remained on antibiotics, pneumonia ruled out.  Antibiotics discontinued.  Hypokalemia: Replaced.  History of multiple sclerosis: Follows up with Dr. Epimenio Foot at neurology.  Continue outpatient neurology.  Physical deconditioning and debility: Patient at baseline with debility and walks with a cane. Now needing 2 person assist.  PT OT recommended CIR, however incidence declined. Family wanting to take him home. Needs to work with more sessions of PT OT before going home with family with home health. Transfer to MedSurg bed and continue to work with PT OT and mobility  specialist.   DVT prophylaxis: heparin injection 5,000 Units Start: 08/21/21 1630 SCDs Start: 08/21/21 1616   Code Status: Full code Family Communication: Called, unable to talk to mother. Disposition Plan: Status is: Inpatient Remains inpatient appropriate because: Unsafe discharge planning     Consultants:  Critical care Neurology  Procedures:  Intubation extubation  Antimicrobials:  None   Subjective: Patient seen and examined.  He tells me he has been doing well and wanting to go home.  Nurse tells me that they needed 2 people to move him out of bed. Patient is mostly alert oriented, he keeps asking same questions again again and then he realizes that he has been doing this.  Objective: Vitals:   08/26/21 0730 08/26/21 0800 08/26/21 0900 08/26/21 1000  BP:  (!) 144/97 (!) 158/94 (!) 152/88  Pulse:  (!) 51 (!) 55 67  Resp:  (!) 25 (!) 21 19  Temp: 99 F (37.2 C)     TempSrc: Oral     SpO2:  95% 94% 94%  Weight:      Height:        Intake/Output Summary (Last 24 hours) at 08/26/2021 1024 Last data filed at 08/26/2021 1000 Gross per 24 hour  Intake 250 ml  Output 485 ml  Net -235 ml   Filed Weights   08/23/21 0300 08/24/21 0400 08/25/21 0500  Weight: 76.5 kg 74.1 kg 73.8 kg    Examination:  General exam: Appears calm and comfortable  Frail.  Not in any distress.  On room air. Respiratory system: No added sounds. Cardiovascular system: S1 & S2 heard, RRR. No pedal edema. Gastrointestinal system: Soft.  Nontender.  Bowel sound present.. Central nervous system: Alert and oriented.  Anxious.  Equal strength all extremities.    Data Reviewed: I have personally reviewed following labs and imaging studies  CBC: Recent Labs  Lab 08/21/21 1423 08/21/21 1806 08/22/21 0637 08/23/21 0103 08/24/21 0553 08/25/21 0712 08/26/21 0218  WBC 20.2*  --  19.1* 17.8* 10.0 8.7 7.4  NEUTROABS 17.3*  --   --   --   --   --   --   HGB 14.8   < > 13.5 12.2* 12.0*  11.6* 11.5*  HCT 44.7   < > 39.2 35.7* 35.0* 33.7* 33.7*  MCV 97.2  --  93.8 94.4 93.1 93.9 94.1  PLT 251  --  171 160 162 172 169   < > = values in this interval not displayed.   Basic Metabolic Panel: Recent Labs  Lab 08/22/21 0637 08/23/21 0103 08/24/21 0553 08/25/21 0712 08/26/21 0218  NA 140 141 141 142 138  K 3.4* 3.7 3.6 3.4* 3.4*  CL 110 111 114* 110 109  CO2 22 19* 22 25 20*  GLUCOSE 96 85 118* 105* 101*  BUN 9 9 11 8 6   CREATININE 0.93 0.97 0.72 0.83 0.76  CALCIUM 7.9* 8.3* 8.1* 8.4* 8.3*  MG 1.7 2.1 2.0 1.9 1.8  PHOS 3.4 2.2* 2.8 3.8 3.8   GFR: Estimated Creatinine Clearance: 135 mL/min (by C-G formula based on SCr of 0.76 mg/dL). Liver Function Tests: Recent Labs  Lab 08/21/21 1423  AST 20  ALT 12  ALKPHOS 59  BILITOT 0.5  PROT 7.3  ALBUMIN 4.5   No results for input(s): "LIPASE", "AMYLASE" in the last 168 hours. No results for input(s): "AMMONIA" in the last 168 hours. Coagulation Profile: No results for input(s): "INR", "PROTIME" in the last 168 hours. Cardiac Enzymes: Recent Labs  Lab 08/22/21 0637  CKTOTAL 1,694*  CKMB 5.4*   BNP (last 3 results) No results for input(s): "PROBNP" in the last 8760 hours. HbA1C: No results for input(s): "HGBA1C" in the last 72 hours. CBG: Recent Labs  Lab 08/23/21 1953 08/23/21 2344 08/24/21 0347 08/24/21 0808 08/24/21 1202  GLUCAP 124* 137* 107* 113* 137*   Lipid Profile: Recent Labs    08/25/21 0712  TRIG 91   Thyroid Function Tests: No results for input(s): "TSH", "T4TOTAL", "FREET4", "T3FREE", "THYROIDAB" in the last 72 hours. Anemia Panel: No results for input(s): "VITAMINB12", "FOLATE", "FERRITIN", "TIBC", "IRON", "RETICCTPCT" in the last 72 hours. Sepsis Labs: No results for input(s): "PROCALCITON", "LATICACIDVEN" in the last 168 hours.  Recent Results (from the past 240 hour(s))  MRSA Next Gen by PCR, Nasal     Status: None   Collection Time: 08/21/21  5:59 PM   Specimen:  Nasopharyngeal Swab; Nasal Swab  Result Value Ref Range Status   MRSA by PCR Next Gen NOT DETECTED NOT DETECTED Final    Comment: (NOTE) The GeneXpert MRSA Assay (FDA approved for NASAL specimens only), is one component of a comprehensive MRSA colonization surveillance program. It is not intended to diagnose MRSA infection nor to guide or monitor treatment for MRSA infections. Test performance is not FDA approved in patients less than 49 years old. Performed at [facility], [address], [place], [place]          Radiology Studies: No results found.      Scheduled Meds:  Chlorhexidine Gluconate Cloth  6 each Topical Q0600   docusate sodium  100 mg Oral BID   feeding supplement  237 mL Oral BID BM   heparin  5,000  Units Subcutaneous Q8H   levETIRAcetam  1,000 mg Oral BID   polyethylene glycol  17 g Oral Daily   Continuous Infusions:  sodium chloride Stopped (08/23/21 0602)     LOS: 5 days    Time spent: 35 minutes    Barb Merino, MD [facility] Pager [phone]

## 2021-08-26 NOTE — Progress Notes (Signed)
Physical Therapy Treatment Patient Details Name: Miguel Black MRN: [id number] DOB: 25-Nov-1990 Today's Date: 08/26/2021   History of Present Illness Pt is a 31 y/o male admitted 7/29 secondary to seizures. Pt intubated on 7/29 and extubated on 8/1. PMH includes MS and seizures.    PT Comments    The pt was seen for continued mobility progression and OOB mobility. The pt is eager to mobilize and able to complete bed mobility without physical assist. Once attempting to complete sit-stand transfers and gait, pt needing up to modA to steady and maintain positioning with RW. Pt with frequent LOB to R and needs cues for maintaining LLE inside RW. With use of AFOs, pt with improved clearance of LLE, but continues to need modA to steady with gait. Will continue to benefit from acute PT prior to d/c to facilitate return towards PLOF.   Noted in chart that pt family declining acute inpatient rehab at this time and would like to d/c home with OPPT follow up. Will need additional PT visits acutely and increased assist at home to facilitate this d/c safely.     Recommendations for follow up therapy are one component of a multi-disciplinary discharge planning process, led by the attending physician.  Recommendations may be updated based on patient status, additional functional criteria and insurance authorization.  Follow Up Recommendations  Acute inpatient rehab (3hours/day) (pt family declining, prefers OPPT)     Assistance Recommended at Discharge Frequent or constant Supervision/Assistance  Patient can return home with the following Two people to help with walking and/or transfers;Two people to help with bathing/dressing/bathroom;Assistance with cooking/housework;Help with stairs or ramp for entrance;Assist for transportation   Equipment Recommendations  Wheelchair cushion (measurements PT);Wheelchair (measurements PT)    Recommendations for Other Services       Precautions / Restrictions  Precautions Precautions: Fall Precaution Comments: has AFO for LLE Restrictions Weight Bearing Restrictions: No     Mobility  Bed Mobility Overal bed mobility: Needs Assistance Bed Mobility: Supine to Sit     Supine to sit: Mod assist     General bed mobility comments: Assist for trunk to come to sitting.    Transfers Overall transfer level: Needs assistance Equipment used: Rolling walker (2 wheels) Transfers: Sit to/from Stand Sit to Stand: Mod assist           General transfer comment: minA to rise with modA to steady in standing. increased cues with backing up to chair to sit    Ambulation/Gait Ambulation/Gait assistance: Mod assist Gait Distance (Feet): 20 Feet (+ 45 ft) Assistive device: Rolling walker (2 wheels) Gait Pattern/deviations: Step-through pattern, Decreased stride length, Knees buckling, Ataxic, Knee hyperextension - left Gait velocity: Decreased     General Gait Details: unsteady with cues needed for RW positioning and proximity. Physical assist required to manage RW and steady pt. Pt also tending to vear towards the R during ambulation and unable to correct. L knee buckling/hyperextension noted as well. Pt reports this is close to his baseline, but that he typically uses a cane       Balance Overall balance assessment: Needs assistance Sitting-balance support: Feet supported, Bilateral upper extremity supported Sitting balance-Leahy Scale: Poor Sitting balance - Comments: Reliant on BUE support   Standing balance support: Bilateral upper extremity supported Standing balance-Leahy Scale: Poor Standing balance comment: Reliant on UE and external support  Cognition Arousal/Alertness: Awake/alert Behavior During Therapy: WFL for tasks assessed/performed Overall Cognitive Status: No family/caregiver present to determine baseline cognitive functioning                                 General  Comments: Decreased safety awareness noted and difficulty sequencing. Almost childlike in nature. Multile cues for safety and sequencing throughout.           General Comments General comments (skin integrity, edema, etc.): VSS on RA        PT Goals (current goals can now be found in the care plan section) Acute Rehab PT Goals Patient Stated Goal: to go home PT Goal Formulation: With patient Time For Goal Achievement: 09/08/21 Potential to Achieve Goals: Good Progress towards PT goals: Progressing toward goals    Frequency    Min 4X/week      PT Plan Discharge plan needs to be updated;Frequency needs to be updated       AM-PAC PT "6 Clicks" Mobility   Outcome Measure  Help needed turning from your back to your side while in a flat bed without using bedrails?: A Little Help needed moving from lying on your back to sitting on the side of a flat bed without using bedrails?: A Little Help needed moving to and from a bed to a chair (including a wheelchair)?: A Lot Help needed standing up from a chair using your arms (e.g., wheelchair or bedside chair)?: A Lot Help needed to walk in hospital room?: A Lot Help needed climbing 3-5 steps with a railing? : Total 6 Click Score: 13    End of Session Equipment Utilized During Treatment: Gait belt Activity Tolerance: Patient tolerated treatment well Patient left: in chair (in WC to transfer to new unit) Nurse Communication: Mobility status PT Visit Diagnosis: Unsteadiness on feet (R26.81);Muscle weakness (generalized) (M62.81);Difficulty in walking, not elsewhere classified (R26.2)     Time: 1451-1510 PT Time Calculation (min) (ACUTE ONLY): 19 min  Charges:  $Gait Training: 8-22 mins                     Vickki Muff, PT, DPT   Acute Rehabilitation Department   Ronnie Derby 08/26/2021, 4:17 PM

## 2021-08-27 DIAGNOSIS — G40901 Epilepsy, unspecified, not intractable, with status epilepticus: Secondary | ICD-10-CM | POA: Diagnosis not present

## 2021-08-27 NOTE — TOC Progression Note (Signed)
Transition of Care [facility]) - Progression Note    Patient Details  Name: Miguel Black MRN: [id number] Date of Birth: 1990-06-18  Transition of [facility]) CM/SW [place], RN Phone Number: 08/27/2021, 2:34 PM  Clinical Narrative:    CM met with the patient and mother at the bedside to discuss transitions of care to home today with the patient's mother by car.  The patient's mother states that she plans to follow up with Dr. Garth Bigness office the first of the week to schedule a hospital follow up,  I attempted to call the office to schedule a follow up visit and their office is closed until [place].  The patient's mother states that patient does not have a primary care.  The patient's mother states that she will call her own primary care provider to see if they are in network with the patient's insurance - Dr. Louanna Raw was listed in the discharge instructions, otherwise she will call and schedule through [facility].  The patient's mother requests referral to [facility] Outpatient rehabilitation - referral placed and physician to cosign - reminder placed in the discharge instructions.  The patient is able to ambulate with a Rolling walker at this time - patient has a cane and rolling walker at home.  Patient to be discharged with his mother to home.   Expected Discharge Plan: OP Rehab Barriers to Discharge: Continued Medical Work up  Expected Discharge Plan and Services Expected Discharge Plan: OP Rehab   Discharge Planning Services: CM Consult   Living arrangements for the past 2 months: Apartment Expected Discharge Date: 08/27/21                                     Social Determinants of Health (SDOH) Interventions    Readmission Risk Interventions     No data to display

## 2021-08-27 NOTE — Progress Notes (Signed)
PROGRESS NOTE    Miguel Black  YTK:[id number] DOB: 13-Sep-1990 DOA: 08/21/2021 PCP: Pcp, No    Brief Narrative:  31 year old gentleman with history of status epilepticus and multiple sclerosis lives at home with parents brought to the ER with seizure.  He was found by family members in the bed with seizure-like activity, he was postictal with EMS and started seizing again at the emergency room.  Intubated for airway protection, treated with IV Ativan and Keppra loading with cessation of seizure activities.  In the emergency room unresponsive, leukocytosis, urine normal.  Chest x-ray with patchy infiltrate left lower lung.  Febrile on admission. 7/29, intubated and admitted to the neuro ICU and treated with Keppra and Ativan. 8/1, extubated to room air. Remains very debilitated, declined by CIR so staying in the hospital to improve mobility.   Assessment & Plan:   Status epilepticus in a patient with known seizure, breakthrough seizure suspected due to missed Keppra: Medically stabilized.  No epileptiform activity on long-term monitoring.  Off sedation and following commands. Neurology recommended to continue Keppra 1 g twice daily. All-time seizure precautions. He does not drive due to baseline balance problems, seizure precautions advised.  Acute hypoxemic respiratory failure secondary to postictal state: Improved to baseline.  Extubated to room air.  Continue progressive mobilization. Initially remained on antibiotics, pneumonia ruled out.  Antibiotics discontinued.  Hypokalemia: Replaced.  Adequate.  History of multiple sclerosis: Follows up with Dr. Epimenio Foot at neurology.  Continue outpatient neurology.  Physical deconditioning and debility: Patient at baseline with debility and walks with a cane. PT OT recommended CIR, however declined by insurance company. Family wanting to take him home. Needs to work with more sessions of PT OT before going home with family with home  health.   DVT prophylaxis: heparin injection 5,000 Units Start: 08/21/21 1630 SCDs Start: 08/21/21 1616   Code Status: Full code Family Communication: Mother on the phone 8/3. Disposition Plan: Status is: Inpatient Remains inpatient appropriate because: Unsafe discharge planning     Consultants:  Critical care Neurology  Procedures:  Intubation extubation  Antimicrobials:  None   Subjective: Seen and examined.  No new events.  He believes he can walk with a walker.  PT session last evening still recommended acute inpatient rehab.  Objective: Vitals:   08/26/21 1741 08/26/21 1928 08/27/21 0459 08/27/21 0810  BP: (!) 146/95 (!) 136/92  (!) 136/92  Pulse: (!) 58 (!) 59  75  Resp: 16 14  18   Temp: 98.6 F (37 C) 99.8 F (37.7 C)  98.6 F (37 C)  TempSrc: Oral Oral  Oral  SpO2: 100% 99%  97%  Weight:   68.2 kg   Height:        Intake/Output Summary (Last 24 hours) at 08/27/2021 1024 Last data filed at 08/27/2021 0830 Gross per 24 hour  Intake 370 ml  Output 450 ml  Net -80 ml   Filed Weights   08/24/21 0400 08/25/21 0500 08/27/21 0459  Weight: 74.1 kg 73.8 kg 68.2 kg    Examination:  General exam: Appears calm and comfortable  Lying in bed.  Comfortable and pleasant. Respiratory system: No added sounds. Cardiovascular system: S1 & S2 heard, RRR. No pedal edema. Gastrointestinal system: Soft.  Nontender.  Bowel sound present.. Central nervous system: Alert and oriented.   Equal strength all extremities.    Data Reviewed: I have personally reviewed following labs and imaging studies  CBC: Recent Labs  Lab 08/21/21 1423 08/21/21 1806 08/22/21 08/24/21  08/23/21 0103 08/24/21 0553 08/25/21 0712 08/26/21 0218  WBC 20.2*  --  19.1* 17.8* 10.0 8.7 7.4  NEUTROABS 17.3*  --   --   --   --   --   --   HGB 14.8   < > 13.5 12.2* 12.0* 11.6* 11.5*  HCT 44.7   < > 39.2 35.7* 35.0* 33.7* 33.7*  MCV 97.2  --  93.8 94.4 93.1 93.9 94.1  PLT 251  --  171 160 162 172  169   < > = values in this interval not displayed.   Basic Metabolic Panel: Recent Labs  Lab 08/22/21 0637 08/23/21 0103 08/24/21 0553 08/25/21 0712 08/26/21 0218  NA 140 141 141 142 138  K 3.4* 3.7 3.6 3.4* 3.4*  CL 110 111 114* 110 109  CO2 22 19* 22 25 20*  GLUCOSE 96 85 118* 105* 101*  BUN 9 9 11 8 6   CREATININE 0.93 0.97 0.72 0.83 0.76  CALCIUM 7.9* 8.3* 8.1* 8.4* 8.3*  MG 1.7 2.1 2.0 1.9 1.8  PHOS 3.4 2.2* 2.8 3.8 3.8   GFR: Estimated Creatinine Clearance: 130.2 mL/min (by C-G formula based on SCr of 0.76 mg/dL). Liver Function Tests: Recent Labs  Lab 08/21/21 1423  AST 20  ALT 12  ALKPHOS 59  BILITOT 0.5  PROT 7.3  ALBUMIN 4.5   No results for input(s): "LIPASE", "AMYLASE" in the last 168 hours. No results for input(s): "AMMONIA" in the last 168 hours. Coagulation Profile: No results for input(s): "INR", "PROTIME" in the last 168 hours. Cardiac Enzymes: Recent Labs  Lab 08/22/21 0637  CKTOTAL 1,694*  CKMB 5.4*   BNP (last 3 results) No results for input(s): "PROBNP" in the last 8760 hours. HbA1C: No results for input(s): "HGBA1C" in the last 72 hours. CBG: Recent Labs  Lab 08/23/21 1953 08/23/21 2344 08/24/21 0347 08/24/21 0808 08/24/21 1202  GLUCAP 124* 137* 107* 113* 137*   Lipid Profile: Recent Labs    08/25/21 0712  TRIG 91   Thyroid Function Tests: No results for input(s): "TSH", "T4TOTAL", "FREET4", "T3FREE", "THYROIDAB" in the last 72 hours. Anemia Panel: No results for input(s): "VITAMINB12", "FOLATE", "FERRITIN", "TIBC", "IRON", "RETICCTPCT" in the last 72 hours. Sepsis Labs: No results for input(s): "PROCALCITON", "LATICACIDVEN" in the last 168 hours.  Recent Results (from the past 240 hour(s))  MRSA Next Gen by PCR, Nasal     Status: None   Collection Time: 08/21/21  5:59 PM   Specimen: Nasopharyngeal Swab; Nasal Swab  Result Value Ref Range Status   MRSA by PCR Next Gen NOT DETECTED NOT DETECTED Final    Comment:  (NOTE) The GeneXpert MRSA Assay (FDA approved for NASAL specimens only), is one component of a comprehensive MRSA colonization surveillance program. It is not intended to diagnose MRSA infection nor to guide or monitor treatment for MRSA infections. Test performance is not FDA approved in patients less than 15 years old. Performed at [facility], [address] [place], [place]          Radiology Studies: No results found.      Scheduled Meds:  Chlorhexidine Gluconate Cloth  6 each Topical Q0600   docusate sodium  100 mg Oral BID   feeding supplement  237 mL Oral BID BM   heparin  5,000 Units Subcutaneous Q8H   levETIRAcetam  1,000 mg Oral BID   polyethylene glycol  17 g Oral Daily   Continuous Infusions:  sodium chloride Stopped (08/23/21 0602)  LOS: 6 days    Time spent: 35 minutes    Barb Merino, MD [facility] Pager [phone]

## 2021-08-27 NOTE — Discharge Summary (Signed)
Physician Discharge Summary  Miguel Black:[id number] DOB: 01-05-1991 DOA: 08/21/2021  PCP: Oneita Hurt, No  Admit date: 08/21/2021 Discharge date: 08/27/2021  Admitted From: Home Disposition: Home with home health PT OT  Recommendations for Outpatient Follow-up:  Follow up with PCP in 1-2 weeks Continue follow-up with neurology  Home Health: PT/OT Equipment/Devices: None  Discharge Condition: Stable CODE STATUS: Full code Diet recommendation: Regular diet  Discharge summary: 31 year old gentleman with history of status epilepticus and multiple sclerosis lives at home with parents brought to the ER with seizure.  He was found by family members in the bed with seizure-like activity, he was postictal with EMS and started seizing again at the emergency room.  Intubated for airway protection, treated with IV Ativan and Keppra loading with cessation of seizure activities.  In the emergency room unresponsive, leukocytosis, urine normal.  Chest x-ray with patchy infiltrate left lower lung.  Febrile on admission. 7/29, intubated and admitted to the neuro ICU and treated with Keppra and Ativan. 8/1, extubated to room air. Remained debilitated and working with therapies.  Clinically improved.  Declined to go to acute inpatient rehab by insurance provider.  He continued to stay in the hospital, improve mobility and going home with parents.  Status epilepticus in a patient with known seizure, breakthrough seizure suspected due to missed Keppra: Medically stabilized.  No epileptiform activity on long-term monitoring.   Neurology recommended to continue Keppra 1 g twice daily. All-time seizure precautions. He does not drive due to baseline balance problems, seizure precautions advised.   History of multiple sclerosis: Follows up with Dr. Epimenio Foot at neurology.  Continue outpatient neurology.   Physical deconditioning and debility: Patient at baseline with debility and walks with a cane. PT OT  recommended CIR, however declined by insurance company. Family wanting to take him home and fell safe to go home. Has 24 hour help.    Discharge Diagnoses:  Active Problems:   Aspiration pneumonia (HCC)   Status epilepticus (HCC)   Acute respiratory failure with hypoxia [facility])    Discharge Instructions  Discharge Instructions     Diet general   Complete by: As directed    Increase activity slowly   Complete by: As directed       Allergies as of 08/27/2021       Reactions   Dalfampridine Other (See Comments)   Seizure 02/2021 while on dalfampridine        Medication List     STOP taking these medications    predniSONE 50 MG tablet Commonly known as: DELTASONE       TAKE these medications    cholecalciferol 25 MCG (1000 UNIT) tablet Commonly known as: VITAMIN D3 Take 5,000 Units by mouth daily.   levETIRAcetam 1000 MG tablet Commonly known as: KEPPRA Take 1 tablet (1,000 mg total) by mouth 2 (two) times daily.   Ocrevus 300 MG/10ML injection Generic drug: ocrelizumab INFUSE 600MG  IN 0.9% NS INTRAVENOUSLY AT 40ML\HR AND INCREASE BY 40ML\HR EVERY 30 MINUTES (MAX 200ML\HR) OVER AT LEAST 3.5HRS AS DIRECTED EVERY 6 MONTHS.        Allergies  Allergen Reactions   Dalfampridine Other (See Comments)    Seizure 02/2021 while on dalfampridine    Consultations: Critical care Neurology   Procedures/Studies: DG Abd Portable 1V  Result Date: 08/23/2021 CLINICAL DATA:  Feeding tube placement. EXAM: PORTABLE ABDOMEN - 1 VIEW COMPARISON:  None Available. FINDINGS: The bowel gas pattern is normal. Distal tip of feeding tube is seen in expected  position of distal stomach. No radio-opaque calculi or other significant radiographic abnormality are seen. IMPRESSION: Distal tip of feeding tube seen in expected position of distal stomach. Electronically Signed   By: Lupita Raider M.D.   On: 08/23/2021 11:49   DG CHEST PORT 1 VIEW  Result Date: 08/23/2021 CLINICAL  DATA:  Seizure.  Ventilator dependent. EXAM: PORTABLE CHEST 1 VIEW COMPARISON:  08/21/2021 FINDINGS: 5:34 a.m. ETT is 4.4 cm from the carina, stable. NGT passes well into the stomach but neither the side-hole or tip are included in the exam. Hazy interstitial consolidation in the left lower lobe continues to be noted with no interval improvement or worsening. Remaining lungs are clear. No pleural effusion is seen. No pneumothorax. The cardiomediastinal silhouette is normal. No vascular congestion is seen. IMPRESSION: Stable overall aeration with no interval change in the left lower lobe opacities and support tubes. Electronically Signed   By: Almira Bar M.D.   On: 08/23/2021 06:59   CT HEAD WO CONTRAST ( )  Result Date: 08/22/2021 CLINICAL DATA:  31 year old male with seizure disorder. EXAM: CT HEAD WITHOUT CONTRAST TECHNIQUE: Contiguous axial images were obtained from the base of the skull through the vertex without intravenous contrast. RADIATION DOSE REDUCTION: This exam was performed according to the departmental dose-optimization program which includes automated exposure control, adjustment of the mA and/or kV according to patient size and/or use of iterative reconstruction technique. COMPARISON:  03/07/2021 head CT.  Brain MRI 08/29/2020. FINDINGS: Brain: Normal cerebral volume. No midline shift, mass effect, or evidence of intracranial mass lesion. No ventriculomegaly. No acute intracranial hemorrhage identified. Asymmetric periventricular white matter hypodensity greater on the left appears stable and most resemble chronic demyelinating disease on the MRI last year. Stable gray-white matter differentiation throughout the brain. No cortically based acute infarct identified. Vascular: No suspicious intracranial vascular hyperdensity. Skull: No acute osseous abnormality identified. Sinuses/Orbits: Mild paranasal sinus mucosal thickening and right maxillary mucous retention cysts are stable. Tympanic  cavities and mastoids remain clear. Other: Retained secretions in the pharynx, intubated on the scout view. Visualized orbits and scalp soft tissues are within normal limits. IMPRESSION: Stable non contrast CT appearance of the brain since February with evidence of chronic demyelinating disease. No acute intracranial abnormality. Electronically Signed   By: Odessa Fleming M.D.   On: 08/22/2021 11:18   Overnight EEG with video  Result Date: 08/22/2021 Charlsie Quest, MD     08/23/2021 10:25 AM Patient Name: Miguel Black MRN: [id number] Epilepsy Attending: Charlsie Quest Referring Physician/Provider: Rejeana Brock, MD Duration: 08/21/2021 1515 to 08/22/2021 1515 Patient history: 31 year old male with a history of MS and epilepsy who presents with recurrent status epilepticus. EEG to evaluate for seizure. Level of alertness:  comatose AEDs during EEG study: LEV, propofol, versed Technical aspects: This EEG study was done with scalp electrodes positioned according to the 10-20 International system of electrode placement. Electrical activity was reviewed with band pass filter of 1-70Hz , sensitivity of 7 uV/mm, display speed of 48mm/sec with a 60Hz  notched filter applied as appropriate. EEG data were recorded continuously and digitally stored.  Video monitoring was available and reviewed as appropriate. Description: EEG showed continuous generalized 3 to 6 Hz theta-delta slowing admixed with 15 to 18 Hz beta activity  distributed symmetrically and diffusely. Hyperventilation and photic stimulation were not performed.   ABNORMALITY - Continuous slow, generalized - Excessive beta, generalized IMPRESSION: This study is suggestive of severe diffuse encephalopathy, nonspecific etiology but likely related to sedation. No  seizures or epileptiform discharges were seen throughout the recording. Lora Havens   DG Chest Portable 1 View  Result Date: 08/21/2021 CLINICAL DATA:  Difficulty breathing EXAM: PORTABLE  CHEST 1 VIEW COMPARISON:  03/08/2021 FINDINGS: Cardiac size is within normal limits. Tip of endotracheal tube is 4.3 cm above the carina. NG tube is noted traversing the esophagus. Small patchy infiltrates are seen in left lower lung field. Rest of the lung fields are clear. There are no signs of alveolar pulmonary edema. There is no pleural effusion or pneumothorax. IMPRESSION: Small patchy infiltrates in left lower lung fields suggest atelectasis/pneumonia. Electronically Signed   By: Elmer Picker M.D.   On: 08/21/2021 15:24   (Echo, Carotid, EGD, Colonoscopy, ERCP)    Subjective: Patient seen and examined.  No overnight events.  Pleasant.  Slightly impulsive.   Discharge Exam: Vitals:   08/26/21 1928 08/27/21 0810  BP: (!) 136/92 (!) 136/92  Pulse: (!) 59 75  Resp: 14 18  Temp: 99.8 F (37.7 C) 98.6 F (37 C)  SpO2: 99% 97%   Vitals:   08/26/21 1741 08/26/21 1928 08/27/21 0459 08/27/21 0810  BP: (!) 146/95 (!) 136/92  (!) 136/92  Pulse: (!) 58 (!) 59  75  Resp: 16 14  18   Temp: 98.6 F (37 C) 99.8 F (37.7 C)  98.6 F (37 C)  TempSrc: Oral Oral  Oral  SpO2: 100% 99%  97%  Weight:   68.2 kg   Height:        General: Pt is alert, awake, not in acute distress Cardiovascular: RRR, S1/S2 +, no rubs, no gallops Respiratory: CTA bilaterally, no wheezing, no rhonchi Abdominal: Soft, NT, ND, bowel sounds + Extremities: no edema, no cyanosis    The results of significant diagnostics from this hospitalization (including imaging, microbiology, ancillary and laboratory) are listed below for reference.     Microbiology: Recent Results (from the past 240 hour(s))  MRSA Next Gen by PCR, Nasal     Status: None   Collection Time: 08/21/21  5:59 PM   Specimen: Nasopharyngeal Swab; Nasal Swab  Result Value Ref Range Status   MRSA by PCR Next Gen NOT DETECTED NOT DETECTED Final    Comment: (NOTE) The GeneXpert MRSA Assay (FDA approved for NASAL specimens only), is one  component of a comprehensive MRSA colonization surveillance program. It is not intended to diagnose MRSA infection nor to guide or monitor treatment for MRSA infections. Test performance is not FDA approved in patients less than 44 years old. Performed at [facility], [place] [address], [place], [place] [address]      Labs: BNP (last 3 results) No results for input(s): "BNP" in the last 8760 hours. Basic Metabolic Panel: Recent Labs  Lab 08/22/21 0637 08/23/21 0103 08/24/21 0553 08/25/21 0712 08/26/21 0218  NA 140 141 141 142 138  K 3.4* 3.7 3.6 3.4* 3.4*  CL 110 111 114* 110 109  CO2 22 19* 22 25 20*  GLUCOSE 96 85 118* 105* 101*  BUN 9 9 11 8 6   CREATININE 0.93 0.97 0.72 0.83 0.76  CALCIUM 7.9* 8.3* 8.1* 8.4* 8.3*  MG 1.7 2.1 2.0 1.9 1.8  PHOS 3.4 2.2* 2.8 3.8 3.8   Liver Function Tests: Recent Labs  Lab 08/21/21 1423  AST 20  ALT 12  ALKPHOS 59  BILITOT 0.5  PROT 7.3  ALBUMIN 4.5   No results for input(s): "LIPASE", "AMYLASE" in the last 168 hours. No results for input(s): "AMMONIA"  in the last 168 hours. CBC: Recent Labs  Lab 08/21/21 1423 08/21/21 1806 08/22/21 0637 08/23/21 0103 08/24/21 0553 08/25/21 0712 08/26/21 0218  WBC 20.2*  --  19.1* 17.8* 10.0 8.7 7.4  NEUTROABS 17.3*  --   --   --   --   --   --   HGB 14.8   < > 13.5 12.2* 12.0* 11.6* 11.5*  HCT 44.7   < > 39.2 35.7* 35.0* 33.7* 33.7*  MCV 97.2  --  93.8 94.4 93.1 93.9 94.1  PLT 251  --  171 160 162 172 169   < > = values in this interval not displayed.   Cardiac Enzymes: Recent Labs  Lab 08/22/21 0637  CKTOTAL 1,694*  CKMB 5.4*   BNP: Invalid input(s): "POCBNP" CBG: Recent Labs  Lab 08/23/21 1953 08/23/21 2344 08/24/21 0347 08/24/21 0808 08/24/21 1202  GLUCAP 124* 137* 107* 113* 137*   D-Dimer No results for input(s): "DDIMER" in the last 72 hours. Hgb A1c No results for input(s): "HGBA1C" in the last 72 hours. Lipid Profile Recent Labs    08/25/21 0712   TRIG 91   Thyroid function studies No results for input(s): "TSH", "T4TOTAL", "T3FREE", "THYROIDAB" in the last 72 hours.  Invalid input(s): "FREET3" Anemia work up No results for input(s): "VITAMINB12", "FOLATE", "FERRITIN", "TIBC", "IRON", "RETICCTPCT" in the last 72 hours. Urinalysis    Component Value Date/Time   COLORURINE YELLOW 08/22/2021 1146   APPEARANCEUR CLEAR 08/22/2021 1146   LABSPEC 1.028 08/22/2021 1146   PHURINE 5.0 08/22/2021 1146   GLUCOSEU NEGATIVE 08/22/2021 1146   HGBUR MODERATE (A) 08/22/2021 1146   BILIRUBINUR NEGATIVE 08/22/2021 1146   KETONESUR 80 (A) 08/22/2021 1146   PROTEINUR 30 (A) 08/22/2021 1146   NITRITE NEGATIVE 08/22/2021 1146   LEUKOCYTESUR TRACE (A) 08/22/2021 1146   Sepsis Labs Recent Labs  Lab 08/23/21 0103 08/24/21 0553 08/25/21 0712 08/26/21 0218  WBC 17.8* 10.0 8.7 7.4   Microbiology Recent Results (from the past 240 hour(s))  MRSA Next Gen by PCR, Nasal     Status: None   Collection Time: 08/21/21  5:59 PM   Specimen: Nasopharyngeal Swab; Nasal Swab  Result Value Ref Range Status   MRSA by PCR Next Gen NOT DETECTED NOT DETECTED Final    Comment: (NOTE) The GeneXpert MRSA Assay (FDA approved for NASAL specimens only), is one component of a comprehensive MRSA colonization surveillance program. It is not intended to diagnose MRSA infection nor to guide or monitor treatment for MRSA infections. Test performance is not FDA approved in patients less than 30 years old. Performed at [facility], [place] [address] [place], [place] [address]      Time coordinating discharge: 35 minutes  SIGNED:   Barb Merino, MD  [facility] 08/27/2021, 1:53 PM
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
## 2021-08-27 NOTE — Progress Notes (Signed)
Patient and mother educated on discharge instructions and are agreeable to plan and outpatient Physical therapy. PT VSS and he will be escorted to [facility] by wheelchair.

## 2021-09-03 ENCOUNTER — Other Ambulatory Visit: Payer: Self-pay

## 2021-09-03 ENCOUNTER — Ambulatory Visit: Payer: Medicare (Managed Care) | Attending: Internal Medicine

## 2021-09-03 DIAGNOSIS — G40909 Epilepsy, unspecified, not intractable, without status epilepticus: Secondary | ICD-10-CM | POA: Insufficient documentation

## 2021-09-03 DIAGNOSIS — R2681 Unsteadiness on feet: Secondary | ICD-10-CM | POA: Diagnosis present

## 2021-09-03 DIAGNOSIS — R262 Difficulty in walking, not elsewhere classified: Secondary | ICD-10-CM | POA: Insufficient documentation

## 2021-09-03 DIAGNOSIS — R2689 Other abnormalities of gait and mobility: Secondary | ICD-10-CM | POA: Insufficient documentation

## 2021-09-03 DIAGNOSIS — M6281 Muscle weakness (generalized): Secondary | ICD-10-CM | POA: Insufficient documentation

## 2021-09-03 DIAGNOSIS — R269 Unspecified abnormalities of gait and mobility: Secondary | ICD-10-CM | POA: Diagnosis not present

## 2021-09-03 NOTE — Therapy (Addendum)
OUTPATIENT PHYSICAL THERAPY NEURO EVALUATION   Patient Name: Miguel Black MRN: [id number] DOB:1990-11-25, 31 y.o., male Today's Date: 09/03/2021   PCP: None listed REFERRING PROVIDER: Dorcas Carrow, MD    PT End of Session - 09/03/21 1016     Visit Number 1    Number of Visits 12    Date for PT Re-Evaluation 10/15/21    Authorization Type [employer] Advantage; Medicaid secondary    Authorization Time Period auth required    Progress Note Due on Visit 10    PT Start Time 1015    PT Stop Time 1100    PT Time Calculation (min) 45 min    Equipment Utilized During Treatment Gait belt    Activity Tolerance Patient tolerated treatment well    Behavior During Therapy WFL for tasks assessed/performed             Past Medical History:  Diagnosis Date   MS (multiple sclerosis) (HCC)    Seizure (HCC)    Status epilepticus (HCC) 07/12/2018   Past Surgical History:  Procedure Laterality Date   NO PAST SURGERIES     Patient Active Problem List   Diagnosis Date Noted   Acute respiratory failure with hypoxia (HCC) 08/23/2021   Status epilepticus (HCC) 08/21/2021   History of optic neuritis 07/29/2020   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   High risk medication use 01/28/2020   Seizure disorder (HCC) 12/04/2019   Marijuana use 12/04/2019   Seizures (HCC) 01/22/2019   Gait disturbance 01/22/2019   Left-sided weakness 01/22/2019   Other fatigue 01/22/2019   Aspiration pneumonia (HCC) 07/12/2018   Relapsing remitting multiple sclerosis (HCC) 07/12/2018   Encounter for screening for HIV 07/12/2018    ONSET DATE: 08/21/21  REFERRING DIAG: G40.909 (ICD-10-CM) - Seizure disorder (HCC) R26.9 (ICD-10-CM) - Gait disturbance   THERAPY DIAG:  Unsteadiness on feet - Plan: PT plan of care cert/re-cert  Muscle weakness (generalized) - Plan: PT plan of care cert/re-cert  Other abnormalities of gait and mobility - Plan: PT plan of care  cert/re-cert  Rationale for Evaluation and Treatment Rehabilitation  SUBJECTIVE:                                                                                                                                                                                              SUBJECTIVE STATEMENT: Pt with onset of seizure activity and hospitalized x 1 week and notes this has also exacerbated left sided weakness from MS Pt accompanied by: self  PERTINENT HISTORY: hx of seizures, MS  PAIN:  Are you having pain? No  PRECAUTIONS: Fall  WEIGHT BEARING RESTRICTIONS No  FALLS: Has patient fallen in last 6 months? No  LIVING ENVIRONMENT: Lives with: lives with their family Lives in: House/apartment Stairs: Yes: Internal: 12 steps; on right going up and External: 4 steps; on right going up and SPC Has following equipment at home: Single point cane  PLOF: Independent with household mobility without device, Independent with community mobility with device, and Independent with homemaking with ambulation  PATIENT GOALS Improve balance and walking to enable trips in community/going to the store  OBJECTIVE:    PATIENT EDUCATION: Education details: assessment findings/fall risk; HEP initiation Person educated: Patient Education method: Explanation, Handouts Education comprehension: verbalized understanding and returned demonstration   HOME EXERCISE PROGRAM: Access Code: [email or web address] URL: [email or web address] Date: 09/03/2021 Prepared by: Shary Decamp  Exercises - Supine Bridge with Resistance Band  - 1 x daily - 7 x weekly - 3 sets - 10 reps - 3 sec hold   DIAGNOSTIC FINDINGS:  Stable non contrast CT appearance of the brain since February with evidence of chronic demyelinating disease. No acute intracranial abnormality.  COGNITION: Overall cognitive status: Within functional limits for tasks assessed   SENSATION: Chronic changes from MS per  pt  report  COORDINATION: LLE deficits    MUSCLE TONE: LLE: Mild and Moderate       POSTURE: anterior pelvic tilt  LOWER EXTREMITY ROM:    WNL  LOWER EXTREMITY MMT:    MMT Right Eval Left Eval  Hip flexion 5 4  Hip extension 4 4-  Hip abduction 4- 3+  Hip adduction 4 4  Hip internal rotation    Hip external rotation    Knee flexion 5 3+  Knee extension 5 3+  Ankle dorsiflexion 5 3  Ankle plantarflexion 4 3  Ankle inversion    Ankle eversion    (Blank rows = not tested)  BED MOBILITY:  Independent  TRANSFERS: Assistive device utilized: Single point cane  Sit to stand: Complete Independence Stand to sit: Complete Independence Chair to chair: Complete Independence and Modified independence Floor: Modified independence  RAMP:  Level of Assistance: SBA Assistive device utilized: Single point cane Ramp Comments: wide BOS, excessive trunk extension to compensate LE weakness  CURB:  Level of Assistance: SBA and CGA Assistive device utilized: Single point cane Curb Comments:   STAIRS:  Level of Assistance: SBA and CGA  Stair Negotiation Technique: Alternating Pattern  with Single Rail on Right Single Rail on Left  Number of Stairs: 10   Height of Stairs: 4-6"  Comments: improved stability when with right HR  GAIT: Gait pattern: decreased arm swing- Left, decreased stance time- Left, and wide BOS Distance walked: 150 Assistive device utilized: Single point cane Level of assistance: SBA and CGA Comments: Uneven/grassy surfaces w/ CGA-min A with LOB during turns  FUNCTIONAL TESTs:  5 times sit to stand: 17.65 sec w/out UE Timed up and go (TUG): 16.94 sec Berg Balance Scale: 36/56 indicating  M-CTSIB  Condition 1: Firm Surface, EO 30 Sec, Normal Sway  Condition 2: Firm Surface, EC 30 Sec, Mild Sway  Condition 3: Foam Surface, EO 30 Sec, Mild and Moderate Sway  Condition 4: Foam Surface, EC 30 Sec, Moderate and Severe Sway    PATIENT SURVEYS:   N/A  TODAY'S TREATMENT:  Evaluation and HEP initiated      GOALS: Goals reviewed with patient? Yes  SHORT TERM GOALS: Target date: 09/24/2021  Patient will be independent in HEP to improve functional outcomes Baseline: initiated after evaluation Goal  status: INITIAL  2.  Manifest improved BLE strength and balance per time 14 sec 5xSTS test Baseline: 17.65 sec Goal status: INITIAL  3. Demonstrate modified independent ambulation over level surfaces x 300 ft and ascending/descending 10 stairs with HR to improve safety with ambulation in home  Baseline: supervision  Goal status: INITIAL  LONG TERM GOALS: Target date: 10/08/2021  Reduce risk for falls during functional mobility per score 45/56 Berg Balance Test Baseline: 36/56 indicating high risk Goal status: INITIAL  2.  Improve safety with ambulation per time 12 sec TUG test to reduce risk for falls Baseline: 16.94 sec Goal status: INITIAL  3.  Increase bilateral hip abductor strength to 4/5 for improved stance control and single limb support stability for gait cycle Baseline: 4-/5 RLE; 3+/5 LLE Goal status: INITIAL  4.  Ambulate over grassy terrain with supervision using cane to improve safety in community Baseline: CGA-min A w/ SPC Goal status: INITIAL   ASSESSMENT:  CLINICAL IMPRESSION: Patient is a 32 y.o. male who was seen today for physical therapy evaluation and treatment for unsteadiness on feet and mobility/balance deficits resulting in high risk for falls per assessment/evaluation tests. Demonstrates generalized weakness LLE > RLE with reduced functional activity tolerance limiting his typical activity to household level due to deficits and impairments. PT services indicated to intervene and facilitate return to PLOF and enhance mobility to reduce risk for falls and enable improved safety with community-level ambulation/mobility.   OBJECTIVE IMPAIRMENTS Abnormal gait, decreased activity tolerance, decreased  balance, decreased coordination, decreased endurance, decreased mobility, difficulty walking, decreased strength, increased muscle spasms, impaired tone, and postural dysfunction.   ACTIVITY LIMITATIONS carrying, lifting, standing, squatting, stairs, reach over head, and locomotion level  PARTICIPATION LIMITATIONS: meal prep, cleaning, laundry, shopping, and community activity  PERSONAL FACTORS Past/current experiences, Time since onset of injury/illness/exacerbation, and 1 comorbidity: MS  are also affecting patient's functional outcome.   REHAB POTENTIAL: Good  CLINICAL DECISION MAKING: Evolving/moderate complexity  EVALUATION COMPLEXITY: Moderate  PLAN: PT FREQUENCY: 1-2x/week  PT DURATION: 6 weeks  PLANNED INTERVENTIONS: Therapeutic exercises, Therapeutic activity, Neuromuscular re-education, Balance training, Gait training, Patient/Family education, Self Care, Joint mobilization, Stair training, Vestibular training, Canalith repositioning, DME instructions, Aquatic Therapy, Dry Needling, Electrical stimulation, Spinal mobilization, Cryotherapy, Moist heat, Taping, and Manual therapy  PLAN FOR NEXT SESSION: Continue with balance, gait, and strength training   11:35 AM, 09/03/21 M. Shary Decamp, PT, DPT Physical Therapist- [place] Office Number: [phone]

## 2021-09-13 ENCOUNTER — Ambulatory Visit: Payer: Medicare (Managed Care)

## 2021-09-13 DIAGNOSIS — R2681 Unsteadiness on feet: Secondary | ICD-10-CM | POA: Diagnosis not present

## 2021-09-13 DIAGNOSIS — R2689 Other abnormalities of gait and mobility: Secondary | ICD-10-CM

## 2021-09-13 DIAGNOSIS — R262 Difficulty in walking, not elsewhere classified: Secondary | ICD-10-CM

## 2021-09-13 DIAGNOSIS — M6281 Muscle weakness (generalized): Secondary | ICD-10-CM

## 2021-09-13 NOTE — Therapy (Signed)
OUTPATIENT PHYSICAL THERAPY NEURO TREATMENT   Patient Name: Miguel Black MRN: [id number] DOB:[birth year], 31 y.o., male Today's Date: 09/13/2021   PCP: None listed REFERRING PROVIDER: Barb Merino, MD    PT End of Session - 09/13/21 1017     Visit Number 2    Number of Visits 12    Date for PT Re-Evaluation 10/15/21    Authorization Type [employer]vantage; Medicaid secondary    Authorization Time Period auth required    Progress Note Due on Visit 10    PT Start Time 1015    PT Stop Time 1100    PT Time Calculation (min) 45 min    Equipment Utilized During Treatment Gait belt    Activity Tolerance Patient tolerated treatment well    Behavior During Therapy WFL for tasks assessed/performed             Past Medical History:  Diagnosis Date   MS (multiple sclerosis) ([place])    Seizure ([place])    Status epilepticus ([place]) 07/12/2018   Past Surgical History:  Procedure Laterality Date   NO PAST SURGERIES     Patient Active Problem List   Diagnosis Date Noted   Acute respiratory failure with hypoxia ([place]) 08/23/2021   Status epilepticus ([place]) 08/21/2021   History of optic neuritis 07/29/2020   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   High risk medication use 01/28/2020   Seizure disorder ([place]) 12/04/2019   Marijuana use 12/04/2019   Seizures ([place]) 01/22/2019   Gait disturbance 01/22/2019   Left-sided weakness 01/22/2019   Other fatigue 01/22/2019   Aspiration pneumonia ([place]) 07/12/2018   Relapsing remitting multiple sclerosis ([place]) 07/12/2018   Encounter for screening for HIV 07/12/2018    ONSET DATE: 08/21/21  REFERRING DIAG: G40.909 (ICD-10-CM) - Seizure disorder ([facility]) R26.9 (ICD-10-CM) - Gait disturbance   THERAPY DIAG:  Unsteadiness on feet  Muscle weakness (generalized)  Other abnormalities of gait and mobility  Difficulty in walking, not elsewhere classified  Rationale for Evaluation and Treatment  Rehabilitation  SUBJECTIVE:                                                                                                                                                                                              SUBJECTIVE STATEMENT: Feeling fine, uneventful weekend Pt accompanied by: self  PERTINENT HISTORY: hx of seizures, MS  PAIN:  Are you having pain? No  PRECAUTIONS: Fall  WEIGHT BEARING RESTRICTIONS No  FALLS: Has patient fallen in last 6 months? No  LIVING ENVIRONMENT: Lives with: lives with their family Lives in: House/apartment Stairs: Yes: Internal: 12 steps; on  right going up and External: 4 steps; on right going up and SPC Has following equipment at home: Single point cane  PLOF: Independent with household mobility without device, Independent with community mobility with device, and Independent with homemaking with ambulation  PATIENT GOALS Improve balance and walking to enable trips in community/going to the store  OBJECTIVE:   TODAY'S TREATMENT: 09/13/21 Activity Comments  Bridge with resistance loop 2x10 Green loop Around knees   Supine hip flexion 2x10 Green loop around feet (bent knee)  Prone hamstring curl 2x10 Green loop around ankles  Sit-stand 3x5  EOM, 10# goblet, tactile cues for left weight shift  Standing and pushing into therapist for LLE Standing against right side of body  [place] deadlift 1x10 10# Tactile cues for hip/spine alignment  Walking march along wall 4x10 ft, CGA-min A for guarding and tactile cues to left hip abduction for single limb support  Standing on foam EO x 30 sec EC x 30 sec     PATIENT EDUCATION: Education details: assessment findings/fall risk; HEP initiation Person educated: Patient Education method: Explanation, Handouts Education comprehension: verbalized understanding and returned demonstration   HOME EXERCISE PROGRAM: Access Code: [email or web address] URL: [email or web address] Date:  09/03/2021 Prepared by: Shary Decamp  Exercises - Supine Bridge with Resistance Band  - 1 x daily - 7 x weekly - 3 sets - 10 reps - 3 sec hold Access Code: [email or web address] - Prone Hamstring Curl with Anchored Resistance  - 1 x daily - 7 x weekly - 3 sets - 10 reps - 2 sec hold - Supine Hip Flexion with Resistance Loop  - 1 x daily - 7 x weekly - 3 sets - 10 reps  DIAGNOSTIC FINDINGS:  Stable non contrast CT appearance of the brain since February with evidence of chronic demyelinating disease. No acute intracranial abnormality.  COGNITION: Overall cognitive status: Within functional limits for tasks assessed   SENSATION: Chronic changes from MS per  pt report  COORDINATION: LLE deficits    MUSCLE TONE: LLE: Mild and Moderate       POSTURE: anterior pelvic tilt  LOWER EXTREMITY ROM:    WNL  LOWER EXTREMITY MMT:    MMT Right Eval Left Eval  Hip flexion 5 4  Hip extension 4 4-  Hip abduction 4- 3+  Hip adduction 4 4  Hip internal rotation    Hip external rotation    Knee flexion 5 3+  Knee extension 5 3+  Ankle dorsiflexion 5 3  Ankle plantarflexion 4 3  Ankle inversion    Ankle eversion    (Blank rows = not tested)  BED MOBILITY:  Independent  TRANSFERS: Assistive device utilized: Single point cane  Sit to stand: Complete Independence Stand to sit: Complete Independence Chair to chair: Complete Independence and Modified independence Floor: Modified independence  RAMP:  Level of Assistance: SBA Assistive device utilized: Single point cane Ramp Comments: wide BOS, excessive trunk extension to compensate LE weakness  CURB:  Level of Assistance: SBA and CGA Assistive device utilized: Single point cane Curb Comments:   STAIRS:  Level of Assistance: SBA and CGA  Stair Negotiation Technique: Alternating Pattern  with Single Rail on Right Single Rail on Left  Number of Stairs: 10   Height of Stairs: 4-6"  Comments: improved stability when with right  HR  GAIT: Gait pattern: decreased arm swing- Left, decreased stance time- Left, and wide BOS Distance walked: 150 Assistive device utilized: Single point cane Level of assistance: SBA and CGA Comments: Uneven/grassy  surfaces w/ CGA-min A with LOB during turns  FUNCTIONAL TESTs:  5 times sit to stand: 17.65 sec w/out UE Timed up and go (TUG): 16.94 sec Berg Balance Scale: 36/56 indicating  M-CTSIB  Condition 1: Firm Surface, EO 30 Sec, Normal Sway  Condition 2: Firm Surface, EC 30 Sec, Mild Sway  Condition 3: Foam Surface, EO 30 Sec, Mild and Moderate Sway  Condition 4: Foam Surface, EC 30 Sec, Moderate and Severe Sway    PATIENT SURVEYS:  N/A  TODAY'S TREATMENT:  Evaluation and HEP initiated      GOALS: Goals reviewed with patient? Yes  SHORT TERM GOALS: Target date: 09/24/2021  Patient will be independent in HEP to improve functional outcomes Baseline: initiated after evaluation Goal status: INITIAL  2.  Manifest improved BLE strength and balance per time 14 sec 5xSTS test Baseline: 17.65 sec Goal status: INITIAL  3. Demonstrate modified independent ambulation over level surfaces x 300 ft and ascending/descending 10 stairs with HR to improve safety with ambulation in home  Baseline: supervision  Goal status: INITIAL  LONG TERM GOALS: Target date: 10/08/2021  Reduce risk for falls during functional mobility per score 45/56 Berg Balance Test Baseline: 36/56 indicating high risk Goal status: INITIAL  2.  Improve safety with ambulation per time 12 sec TUG test to reduce risk for falls Baseline: 16.94 sec Goal status: INITIAL  3.  Increase bilateral hip abductor strength to 4/5 for improved stance control and single limb support stability for gait cycle Baseline: 4-/5 RLE; 3+/5 LLE Goal status: INITIAL  4.  Ambulate over grassy terrain with supervision using cane to improve safety in community Baseline: CGA-min A w/ SPC Goal status:  INITIAL   ASSESSMENT:  CLINICAL IMPRESSION: Proceeded with treatment to improve LLE strength by way of open chain movements with green t-band resistance to improve isolation and recruitment with selective LLE movements.  Proceeded with trials of compound lifts with difficulty in maintaining LLE weight shift requiring various tactile cues/techniques to promote increased weightbearing to LLE to good effect with use of mirror for midline orientation. Continued sessions to progress balance and motor control as continnues to demo significant deficits in dynamic standing balance.  Able to maintain eyes closed on foam x 30 sec today but with decrease in LLE weight acceptance noted   OBJECTIVE IMPAIRMENTS Abnormal gait, decreased activity tolerance, decreased balance, decreased coordination, decreased endurance, decreased mobility, difficulty walking, decreased strength, increased muscle spasms, impaired tone, and postural dysfunction.   ACTIVITY LIMITATIONS carrying, lifting, standing, squatting, stairs, reach over head, and locomotion level  PARTICIPATION LIMITATIONS: meal prep, cleaning, laundry, shopping, and community activity  PERSONAL FACTORS Past/current experiences, Time since onset of injury/illness/exacerbation, and 1 comorbidity: MS  are also affecting patient's functional outcome.   REHAB POTENTIAL: Good  CLINICAL DECISION MAKING: Evolving/moderate complexity  EVALUATION COMPLEXITY: Moderate  PLAN: PT FREQUENCY: 1-2x/week  PT DURATION: 6 weeks  PLANNED INTERVENTIONS: Therapeutic exercises, Therapeutic activity, Neuromuscular re-education, Balance training, Gait training, Patient/Family education, Self Care, Joint mobilization, Stair training, Vestibular training, Canalith repositioning, DME instructions, Aquatic Therapy, Dry Needling, Electrical stimulation, Spinal mobilization, Cryotherapy, Moist heat, Taping, and Manual therapy  PLAN FOR NEXT SESSION: Continue with balance, gait,  and strength training   10:17 AM, 09/13/21 M. Shary Decamp, PT, DPT Physical Therapist- [place] Office Number: [phone]

## 2021-09-15 ENCOUNTER — Ambulatory Visit: Payer: Medicare (Managed Care)

## 2021-09-15 DIAGNOSIS — R262 Difficulty in walking, not elsewhere classified: Secondary | ICD-10-CM

## 2021-09-15 DIAGNOSIS — R2681 Unsteadiness on feet: Secondary | ICD-10-CM | POA: Diagnosis not present

## 2021-09-15 DIAGNOSIS — R2689 Other abnormalities of gait and mobility: Secondary | ICD-10-CM

## 2021-09-15 DIAGNOSIS — M6281 Muscle weakness (generalized): Secondary | ICD-10-CM

## 2021-09-15 NOTE — Therapy (Signed)
OUTPATIENT PHYSICAL THERAPY NEURO TREATMENT   Patient Name: Miguel Black MRN: [id number] DOB:1990/06/18, 31 y.o., male Today's Date: 09/15/2021   PCP: None listed REFERRING PROVIDER: Dorcas Carrow, MD    PT End of Session - 09/15/21 1023     Visit Number 3    Number of Visits 12    Date for PT Re-Evaluation 10/15/21    Authorization Type [employer] Medicare Advantage; Medicaid secondary    Authorization Time Period auth required    Progress Note Due on Visit 10    PT Start Time 1015    PT Stop Time 1100    PT Time Calculation (min) 45 min    Equipment Utilized During Treatment Gait belt    Activity Tolerance Patient tolerated treatment well    Behavior During Therapy WFL for tasks assessed/performed             Past Medical History:  Diagnosis Date   MS (multiple sclerosis) ([facility])    Seizure ([facility])    Status epilepticus ([facility]) 07/12/2018   Past Surgical History:  Procedure Laterality Date   NO PAST SURGERIES     Patient Active Problem List   Diagnosis Date Noted   Acute respiratory failure with hypoxia ([facility]) 08/23/2021   Status epilepticus ([facility]) 08/21/2021   History of optic neuritis 07/29/2020   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   High risk medication use 01/28/2020   Seizure disorder ([facility]) 12/04/2019   Marijuana use 12/04/2019   Seizures ([facility]) 01/22/2019   Gait disturbance 01/22/2019   Left-sided weakness 01/22/2019   Other fatigue 01/22/2019   Aspiration pneumonia ([facility]) 07/12/2018   Relapsing remitting multiple sclerosis ([facility]) 07/12/2018   Encounter for screening for HIV 07/12/2018    ONSET DATE: 08/21/21  REFERRING DIAG: G40.909 (ICD-10-CM) - Seizure disorder ([facility]) R26.9 (ICD-10-CM) - Gait disturbance   THERAPY DIAG:  Unsteadiness on feet  Muscle weakness (generalized)  Other abnormalities of gait and mobility  Difficulty in walking, not elsewhere classified  Rationale for Evaluation and Treatment  Rehabilitation  SUBJECTIVE:                                                                                                                                                                                              SUBJECTIVE STATEMENT: A little muscle soreness after last session, but just lasted a day Pt accompanied by: self  PERTINENT HISTORY: hx of seizures, MS  PAIN:  Are you having pain? No  PRECAUTIONS: Fall  WEIGHT BEARING RESTRICTIONS No  FALLS: Has patient fallen in last 6 months? No  LIVING ENVIRONMENT: Lives with: lives with their family Lives  in: House/apartment Stairs: Yes: Internal: 12 steps; on right going up and External: 4 steps; on right going up and SPC Has following equipment at home: Single point cane  PLOF: Independent with household mobility without device, Independent with community mobility with device, and Independent with homemaking with ambulation  PATIENT GOALS Improve balance and walking to enable trips in community/going to the store  OBJECTIVE:   TODAY'S TREATMENT: 09/15/21 Activity Comments  Sit-stand 2x5 RLE offset by 2" box, 1x5 stride stance  Bridge with resistance loop 3x10 Green loop Around knees   Supine hip flexion 3x10 Green loop around feet (bent knee) and holding 4.4# med ball   Prone hamstring curls 3x10 Green loop around ankles  Standing on foam EO/EC 3x15 sec Head turns 5x EO/EC Trunk twists 2x10 4.4# med ball Overhead press 2x10 4.4# med ball          PATIENT EDUCATION: Education details: assessment findings/fall risk; HEP initiation Person educated: Patient Education method: Explanation, Handouts Education comprehension: verbalized understanding and returned demonstration   HOME EXERCISE PROGRAM: Access Code: [email or web address] URL: [email or web address] Date: 09/03/2021 Prepared by: Shary Decamp  Exercises - Supine Bridge with Resistance Band  - 1 x daily - 7 x weekly - 3 sets - 10 reps - 3 sec  hold Access Code: [email or web address] - Prone Hamstring Curl with Anchored Resistance  - 1 x daily - 7 x weekly - 3 sets - 10 reps - 2 sec hold - Supine Hip Flexion with Resistance Loop  - 1 x daily - 7 x weekly - 3 sets - 10 reps  DIAGNOSTIC FINDINGS:  Stable non contrast CT appearance of the brain since February with evidence of chronic demyelinating disease. No acute intracranial abnormality.  COGNITION: Overall cognitive status: Within functional limits for tasks assessed   SENSATION: Chronic changes from MS per  pt report  COORDINATION: LLE deficits    MUSCLE TONE: LLE: Mild and Moderate       POSTURE: anterior pelvic tilt  LOWER EXTREMITY ROM:    WNL  LOWER EXTREMITY MMT:    MMT Right Eval Left Eval  Hip flexion 5 4  Hip extension 4 4-  Hip abduction 4- 3+  Hip adduction 4 4  Hip internal rotation    Hip external rotation    Knee flexion 5 3+  Knee extension 5 3+  Ankle dorsiflexion 5 3  Ankle plantarflexion 4 3  Ankle inversion    Ankle eversion    (Blank rows = not tested)  BED MOBILITY:  Independent  TRANSFERS: Assistive device utilized: Single point cane  Sit to stand: Complete Independence Stand to sit: Complete Independence Chair to chair: Complete Independence and Modified independence Floor: Modified independence  RAMP:  Level of Assistance: SBA Assistive device utilized: Single point cane Ramp Comments: wide BOS, excessive trunk extension to compensate LE weakness  CURB:  Level of Assistance: SBA and CGA Assistive device utilized: Single point cane Curb Comments:   STAIRS:  Level of Assistance: SBA and CGA  Stair Negotiation Technique: Alternating Pattern  with Single Rail on Right Single Rail on Left  Number of Stairs: 10   Height of Stairs: 4-6"  Comments: improved stability when with right HR  GAIT: Gait pattern: decreased arm swing- Left, decreased stance time- Left, and wide BOS Distance walked: 150 Assistive device utilized:  Single point cane Level of assistance: SBA and CGA Comments: Uneven/grassy surfaces w/ CGA-min A with LOB during turns  FUNCTIONAL TESTs:  5 times sit to stand: 17.65  sec w/out UE Timed up and go (TUG): 16.94 sec Berg Balance Scale: 36/56 indicating  M-CTSIB  Condition 1: Firm Surface, EO 30 Sec, Normal Sway  Condition 2: Firm Surface, EC 30 Sec, Mild Sway  Condition 3: Foam Surface, EO 30 Sec, Mild and Moderate Sway  Condition 4: Foam Surface, EC 30 Sec, Moderate and Severe Sway    PATIENT SURVEYS:  N/A  TODAY'S TREATMENT:  Evaluation and HEP initiated      GOALS: Goals reviewed with patient? Yes  SHORT TERM GOALS: Target date: 09/24/2021  Patient will be independent in HEP to improve functional outcomes Baseline: initiated after evaluation Goal status: INITIAL  2.  Manifest improved BLE strength and balance per time 14 sec 5xSTS test Baseline: 17.65 sec Goal status: INITIAL  3. Demonstrate modified independent ambulation over level surfaces x 300 ft and ascending/descending 10 stairs with HR to improve safety with ambulation in home  Baseline: supervision  Goal status: INITIAL  LONG TERM GOALS: Target date: 10/08/2021  Reduce risk for falls during functional mobility per score 45/56 Berg Balance Test Baseline: 36/56 indicating high risk Goal status: INITIAL  2.  Improve safety with ambulation per time 12 sec TUG test to reduce risk for falls Baseline: 16.94 sec Goal status: INITIAL  3.  Increase bilateral hip abductor strength to 4/5 for improved stance control and single limb support stability for gait cycle Baseline: 4-/5 RLE; 3+/5 LLE Goal status: INITIAL  4.  Ambulate over grassy terrain with supervision using cane to improve safety in community Baseline: CGA-min A w/ SPC Goal status: INITIAL   ASSESSMENT:  CLINICAL IMPRESSION: Treatment initiated with strengthening for LLE and HEP review with poor recall demonstrated requiring cues for all  activities initiated thus far.  Proceeded with training in activities to bias LLE for tranfers to improve strength and motor control and improved balance on compliant surfaces. Continued sessions to reinforce HEP compliance and development.   OBJECTIVE IMPAIRMENTS Abnormal gait, decreased activity tolerance, decreased balance, decreased coordination, decreased endurance, decreased mobility, difficulty walking, decreased strength, increased muscle spasms, impaired tone, and postural dysfunction.   ACTIVITY LIMITATIONS carrying, lifting, standing, squatting, stairs, reach over head, and locomotion level  PARTICIPATION LIMITATIONS: meal prep, cleaning, laundry, shopping, and community activity  PERSONAL FACTORS Past/current experiences, Time since onset of injury/illness/exacerbation, and 1 comorbidity: MS  are also affecting patient's functional outcome.   REHAB POTENTIAL: Good  CLINICAL DECISION MAKING: Evolving/moderate complexity  EVALUATION COMPLEXITY: Moderate  PLAN: PT FREQUENCY: 1-2x/week  PT DURATION: 6 weeks  PLANNED INTERVENTIONS: Therapeutic exercises, Therapeutic activity, Neuromuscular re-education, Balance training, Gait training, Patient/Family education, Self Care, Joint mobilization, Stair training, Vestibular training, Canalith repositioning, DME instructions, Aquatic Therapy, Dry Needling, Electrical stimulation, Spinal mobilization, Cryotherapy, Moist heat, Taping, and Manual therapy  PLAN FOR NEXT SESSION: Continue with balance, gait, and strength training   10:24 AM, 09/15/21 M. Sherlyn Lees, PT, DPT Physical Therapist- [place] Office Number: [phone]

## 2021-09-20 ENCOUNTER — Ambulatory Visit: Payer: Medicare (Managed Care)

## 2021-09-20 DIAGNOSIS — R2681 Unsteadiness on feet: Secondary | ICD-10-CM

## 2021-09-20 DIAGNOSIS — R262 Difficulty in walking, not elsewhere classified: Secondary | ICD-10-CM

## 2021-09-20 DIAGNOSIS — M6281 Muscle weakness (generalized): Secondary | ICD-10-CM

## 2021-09-20 DIAGNOSIS — R2689 Other abnormalities of gait and mobility: Secondary | ICD-10-CM

## 2021-09-20 NOTE — Therapy (Signed)
OUTPATIENT PHYSICAL THERAPY NEURO TREATMENT   Patient Name: Miguel Black MRN: [id number] DOB:12-26-1990, 31 y.o., male Today's Date: 09/20/2021   PCP: None listed REFERRING PROVIDER: Dorcas Carrow, MD    PT End of Session - 09/20/21 1018     Visit Number 4    Number of Visits 12    Date for PT Re-Evaluation 10/15/21    Authorization Type [employer] Advantage; Medicaid secondary    Authorization Time Period auth required    Progress Note Due on Visit 10    PT Start Time 1015    PT Stop Time 1100    PT Time Calculation (min) 45 min    Equipment Utilized During Treatment Gait belt    Activity Tolerance Patient tolerated treatment well    Behavior During Therapy WFL for tasks assessed/performed             Past Medical History:  Diagnosis Date   MS (multiple sclerosis) (HCC)    Seizure (HCC)    Status epilepticus (HCC) 07/12/2018   Past Surgical History:  Procedure Laterality Date   NO PAST SURGERIES     Patient Active Problem List   Diagnosis Date Noted   Acute respiratory failure with hypoxia (HCC) 08/23/2021   Status epilepticus (HCC) 08/21/2021   History of optic neuritis 07/29/2020   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   High risk medication use 01/28/2020   Seizure disorder (HCC) 12/04/2019   Marijuana use 12/04/2019   Seizures (HCC) 01/22/2019   Gait disturbance 01/22/2019   Left-sided weakness 01/22/2019   Other fatigue 01/22/2019   Aspiration pneumonia (HCC) 07/12/2018   Relapsing remitting multiple sclerosis (HCC) 07/12/2018   Encounter for screening for HIV 07/12/2018    ONSET DATE: 08/21/21  REFERRING DIAG: G40.909 (ICD-10-CM) - Seizure disorder (HCC) R26.9 (ICD-10-CM) - Gait disturbance   THERAPY DIAG:  Unsteadiness on feet  Muscle weakness (generalized)  Difficulty in walking, not elsewhere classified  Other abnormalities of gait and mobility  Rationale for Evaluation and Treatment  Rehabilitation  SUBJECTIVE:                                                                                                                                                                                              SUBJECTIVE STATEMENT: Pretty tired over the weekend and didn't exercise very much Pt accompanied by: self  PERTINENT HISTORY: hx of seizures, MS  PAIN:  Are you having pain? No  PRECAUTIONS: Fall  WEIGHT BEARING RESTRICTIONS No  FALLS: Has patient fallen in last 6 months? No  LIVING ENVIRONMENT: Lives with: lives with their family Lives in: House/apartment  Stairs: Yes: Internal: 12 steps; on right going up and External: 4 steps; on right going up and SPC Has following equipment at home: Single point cane  PLOF: Independent with household mobility without device, Independent with community mobility with device, and Independent with homemaking with ambulation  PATIENT GOALS Improve balance and walking to enable trips in community/going to the store  OBJECTIVE:   TODAY'S TREATMENT: 09/20/21 Activity Comments  Sit-stand 5x5 Stride stance EOM  Bridge with resistance loop 3x10 Green loop Around knees   Supine hip flexion 3x10 Green loop around feet (bent knee)   Prone hamstring curls 3x10 Green loop + 2.5 lbs  Quadruped  -shoulder taps/knee lifts 5x -alt arm lift 5x -alt leg lift 5x (2.5# LLE)  Standing on foam -EO/EC 2x15 sec -head turns 5x EO/EC -trunk twists 2x5 2.5# -overhead press 2x5 2.5# -   marching W/out AD. Multiple LOB right       PATIENT EDUCATION: Education details: assessment findings/fall risk; HEP initiation Person educated: Patient Education method: Explanation, Handouts Education comprehension: verbalized understanding and returned demonstration   HOME EXERCISE PROGRAM: Access Code: [email or web address] URL: [email or web address] Date: 09/03/2021 Prepared by: Sherlyn Lees  Exercises - Supine Bridge with Resistance Band  - 1 x  daily - 7 x weekly - 3 sets - 10 reps - 3 sec hold Access Code: [id number] - Prone Hamstring Curl with Anchored Resistance  - 1 x daily - 7 x weekly - 3 sets - 10 reps - 2 sec hold - Supine Hip Flexion with Resistance Loop  - 1 x daily - 7 x weekly - 3 sets - 10 reps  DIAGNOSTIC FINDINGS:  Stable non contrast CT appearance of the brain since February with evidence of chronic demyelinating disease. No acute intracranial abnormality.  COGNITION: Overall cognitive status: Within functional limits for tasks assessed   SENSATION: Chronic changes from MS per  pt report  COORDINATION: LLE deficits    MUSCLE TONE: LLE: Mild and Moderate       POSTURE: anterior pelvic tilt  LOWER EXTREMITY ROM:    WNL  LOWER EXTREMITY MMT:    MMT Right Eval Left Eval  Hip flexion 5 4  Hip extension 4 4-  Hip abduction 4- 3+  Hip adduction 4 4  Hip internal rotation    Hip external rotation    Knee flexion 5 3+  Knee extension 5 3+  Ankle dorsiflexion 5 3  Ankle plantarflexion 4 3  Ankle inversion    Ankle eversion    (Blank rows = not tested)  BED MOBILITY:  Independent  TRANSFERS: Assistive device utilized: Single point cane  Sit to stand: Complete Independence Stand to sit: Complete Independence Chair to chair: Complete Independence and Modified independence Floor: Modified independence  RAMP:  Level of Assistance: SBA Assistive device utilized: Single point cane Ramp Comments: wide BOS, excessive trunk extension to compensate LE weakness  CURB:  Level of Assistance: SBA and CGA Assistive device utilized: Single point cane Curb Comments:   STAIRS:  Level of Assistance: SBA and CGA  Stair Negotiation Technique: Alternating Pattern  with Single Rail on Right Single Rail on Left  Number of Stairs: 10   Height of Stairs: 4-6"  Comments: improved stability when with right HR  GAIT: Gait pattern: decreased arm swing- Left, decreased stance time- Left, and wide  BOS Distance walked: 150 Assistive device utilized: Single point cane Level of assistance: SBA and CGA Comments: Uneven/grassy surfaces w/ CGA-min A with LOB during turns  FUNCTIONAL  TESTs:  5 times sit to stand: 17.65 sec w/out UE Timed up and go (TUG): 16.94 sec Berg Balance Scale: 36/56 indicating  M-CTSIB  Condition 1: Firm Surface, EO 30 Sec, Normal Sway  Condition 2: Firm Surface, EC 30 Sec, Mild Sway  Condition 3: Foam Surface, EO 30 Sec, Mild and Moderate Sway  Condition 4: Foam Surface, EC 30 Sec, Moderate and Severe Sway    PATIENT SURVEYS:  N/A  TODAY'S TREATMENT:  Evaluation and HEP initiated      GOALS: Goals reviewed with patient? Yes  SHORT TERM GOALS: Target date: 09/24/2021  Patient will be independent in HEP to improve functional outcomes Baseline: initiated after evaluation Goal status: INITIAL  2.  Manifest improved BLE strength and balance per time 14 sec 5xSTS test Baseline: 17.65 sec Goal status: INITIAL  3. Demonstrate modified independent ambulation over level surfaces x 300 ft and ascending/descending 10 stairs with HR to improve safety with ambulation in home  Baseline: supervision  Goal status: INITIAL  LONG TERM GOALS: Target date: 10/08/2021  Reduce risk for falls during functional mobility per score 45/56 Berg Balance Test Baseline: 36/56 indicating high risk Goal status: INITIAL  2.  Improve safety with ambulation per time 12 sec TUG test to reduce risk for falls Baseline: 16.94 sec Goal status: INITIAL  3.  Increase bilateral hip abductor strength to 4/5 for improved stance control and single limb support stability for gait cycle Baseline: 4-/5 RLE; 3+/5 LLE Goal status: INITIAL  4.  Ambulate over grassy terrain with supervision using cane to improve safety in community Baseline: CGA-min A w/ SPC Goal status: INITIAL   ASSESSMENT:  CLINICAL IMPRESSION: Poor HEP recall requiring 100% cues/guidance.  Initiated quadruped  activities today with intent on incorporating to HEP for benefit of Wbing and core stabilization as well as isolated coordination.  Difficulty with balance activities on foam with head turns and also with single leg support during march activities with multiple LOB   OBJECTIVE IMPAIRMENTS Abnormal gait, decreased activity tolerance, decreased balance, decreased coordination, decreased endurance, decreased mobility, difficulty walking, decreased strength, increased muscle spasms, impaired tone, and postural dysfunction.   ACTIVITY LIMITATIONS carrying, lifting, standing, squatting, stairs, reach over head, and locomotion level  PARTICIPATION LIMITATIONS: meal prep, cleaning, laundry, shopping, and community activity  PERSONAL FACTORS Past/current experiences, Time since onset of injury/illness/exacerbation, and 1 comorbidity: MS  are also affecting patient's functional outcome.   REHAB POTENTIAL: Good  CLINICAL DECISION MAKING: Evolving/moderate complexity  EVALUATION COMPLEXITY: Moderate  PLAN: PT FREQUENCY: 1-2x/week  PT DURATION: 6 weeks  PLANNED INTERVENTIONS: Therapeutic exercises, Therapeutic activity, Neuromuscular re-education, Balance training, Gait training, Patient/Family education, Self Care, Joint mobilization, Stair training, Vestibular training, Canalith repositioning, DME instructions, Aquatic Therapy, Dry Needling, Electrical stimulation, Spinal mobilization, Cryotherapy, Moist heat, Taping, and Manual therapy  PLAN FOR NEXT SESSION: Continue with balance, gait, and strength training   10:20 AM, 09/20/21 M. Shary Decamp, PT, DPT Physical Therapist- [place] Office Number: [phone]

## 2021-09-22 ENCOUNTER — Ambulatory Visit: Payer: Medicare (Managed Care)

## 2021-09-28 ENCOUNTER — Ambulatory Visit: Payer: Medicare (Managed Care) | Admitting: Neurology

## 2021-09-29 ENCOUNTER — Ambulatory Visit: Payer: Medicare (Managed Care) | Attending: Internal Medicine

## 2021-09-29 DIAGNOSIS — R2681 Unsteadiness on feet: Secondary | ICD-10-CM | POA: Insufficient documentation

## 2021-09-29 DIAGNOSIS — M6281 Muscle weakness (generalized): Secondary | ICD-10-CM | POA: Insufficient documentation

## 2021-09-29 DIAGNOSIS — R2689 Other abnormalities of gait and mobility: Secondary | ICD-10-CM | POA: Diagnosis present

## 2021-09-29 DIAGNOSIS — R262 Difficulty in walking, not elsewhere classified: Secondary | ICD-10-CM | POA: Insufficient documentation

## 2021-09-29 NOTE — Therapy (Signed)
OUTPATIENT PHYSICAL THERAPY NEURO TREATMENT   Patient Name: Miguel Black MRN: [id number] DOB:July 16, 1990, 31 y.o., male Today's Date: 09/29/2021   PCP: None listed REFERRING PROVIDER: Dorcas Carrow, MD    PT End of Session - 09/29/21 1016     Visit Number 5    Number of Visits 12    Date for PT Re-Evaluation 10/15/21    Authorization Type [employer] Advantage; Medicaid secondary    Authorization Time Period auth required    Progress Note Due on Visit 10    PT Start Time 1015    PT Stop Time 1100    PT Time Calculation (min) 45 min    Equipment Utilized During Treatment Gait belt    Activity Tolerance Patient tolerated treatment well    Behavior During Therapy WFL for tasks assessed/performed             Past Medical History:  Diagnosis Date   MS (multiple sclerosis) (HCC)    Seizure (HCC)    Status epilepticus (HCC) 07/12/2018   Past Surgical History:  Procedure Laterality Date   NO PAST SURGERIES     Patient Active Problem List   Diagnosis Date Noted   Acute respiratory failure with hypoxia (HCC) 08/23/2021   Status epilepticus (HCC) 08/21/2021   History of optic neuritis 07/29/2020   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   High risk medication use 01/28/2020   Seizure disorder (HCC) 12/04/2019   Marijuana use 12/04/2019   Seizures (HCC) 01/22/2019   Gait disturbance 01/22/2019   Left-sided weakness 01/22/2019   Other fatigue 01/22/2019   Aspiration pneumonia (HCC) 07/12/2018   Relapsing remitting multiple sclerosis (HCC) 07/12/2018   Encounter for screening for HIV 07/12/2018    ONSET DATE: 08/21/21  REFERRING DIAG: G40.909 (ICD-10-CM) - Seizure disorder (HCC) R26.9 (ICD-10-CM) - Gait disturbance   THERAPY DIAG:  Unsteadiness on feet  Muscle weakness (generalized)  Difficulty in walking, not elsewhere classified  Other abnormalities of gait and mobility  Rationale for Evaluation and Treatment  Rehabilitation  SUBJECTIVE:                                                                                                                                                                                              SUBJECTIVE STATEMENT: No falls in past couple weeks  Pt accompanied by: self  PERTINENT HISTORY: hx of seizures, MS  PAIN:  Are you having pain? No  PRECAUTIONS: Fall  WEIGHT BEARING RESTRICTIONS No  FALLS: Has patient fallen in last 6 months? No  LIVING ENVIRONMENT: Lives with: lives with their family Lives in: House/apartment Stairs: Yes: Internal:  12 steps; on right going up and External: 4 steps; on right going up and SPC Has following equipment at home: Single point cane  PLOF: Independent with household mobility without device, Independent with community mobility with device, and Independent with homemaking with ambulation  PATIENT GOALS Improve balance and walking to enable trips in community/going to the store  OBJECTIVE:   TODAY'S TREATMENT: 09/29/21 Activity Comments  LAQ 3x10 2.5#  Bridge with resistance loop 3x10 Green loop Around knees   Supine hip flexion 3x10 Green loop around feet (bent knee)   Prone hamstring curls 3x10 Green loop + 2.5 lbs  Quadruped  -shoulder taps/knee lifts 5x -alt arm lift 5x -alt leg lift 5x (2.5# LLE)  Corner balance/coordination Boxing shots and forms 1-4, shadow boxing and target          PATIENT EDUCATION: Education details: assessment findings/fall risk; HEP initiation Person educated: Patient Education method: Explanation, Handouts Education comprehension: verbalized understanding and returned demonstration   HOME EXERCISE PROGRAM: Access Code: [email or web address] URL: [email or web address] Date: 09/03/2021 Prepared by: Shary Decamp  Exercises - Supine Bridge with Resistance Band  - 1 x daily - 7 x weekly - 3 sets - 10 reps - 3 sec hold Access Code: [email or web address] - Prone Hamstring Curl with Anchored  Resistance  - 1 x daily - 7 x weekly - 3 sets - 10 reps - 2 sec hold - Supine Hip Flexion with Resistance Loop  - 1 x daily - 7 x weekly - 3 sets - 10 reps Access Code: [email or web address] - Quadruped Alternating Arm Lift  - 1 x daily - 7 x weekly - 1-3 sets - 10 reps - Quadruped Alternating Leg Extensions  - 1 x daily - 7 x weekly - 1-3 sets - 10 reps  DIAGNOSTIC FINDINGS:  Stable non contrast CT appearance of the brain since February with evidence of chronic demyelinating disease. No acute intracranial abnormality.  COGNITION: Overall cognitive status: Within functional limits for tasks assessed   SENSATION: Chronic changes from MS per  pt report  COORDINATION: LLE deficits    MUSCLE TONE: LLE: Mild and Moderate       POSTURE: anterior pelvic tilt  LOWER EXTREMITY ROM:    WNL  LOWER EXTREMITY MMT:    MMT Right Eval Left Eval  Hip flexion 5 4  Hip extension 4 4-  Hip abduction 4- 3+  Hip adduction 4 4  Hip internal rotation    Hip external rotation    Knee flexion 5 3+  Knee extension 5 3+  Ankle dorsiflexion 5 3  Ankle plantarflexion 4 3  Ankle inversion    Ankle eversion    (Blank rows = not tested)  BED MOBILITY:  Independent  TRANSFERS: Assistive device utilized: Single point cane  Sit to stand: Complete Independence Stand to sit: Complete Independence Chair to chair: Complete Independence and Modified independence Floor: Modified independence  RAMP:  Level of Assistance: SBA Assistive device utilized: Single point cane Ramp Comments: wide BOS, excessive trunk extension to compensate LE weakness  CURB:  Level of Assistance: SBA and CGA Assistive device utilized: Single point cane Curb Comments:   STAIRS:  Level of Assistance: SBA and CGA  Stair Negotiation Technique: Alternating Pattern  with Single Rail on Right Single Rail on Left  Number of Stairs: 10   Height of Stairs: 4-6"  Comments: improved stability when with right HR  GAIT: Gait  pattern: decreased arm swing- Left, decreased stance time- Left, and wide BOS Distance walked:  150 Assistive device utilized: Single point cane Level of assistance: SBA and CGA Comments: Uneven/grassy surfaces w/ CGA-min A with LOB during turns  FUNCTIONAL TESTs:  5 times sit to stand: 17.65 sec w/out UE Timed up and go (TUG): 16.94 sec Berg Balance Scale: 36/56 indicating  M-CTSIB  Condition 1: Firm Surface, EO 30 Sec, Normal Sway  Condition 2: Firm Surface, EC 30 Sec, Mild Sway  Condition 3: Foam Surface, EO 30 Sec, Mild and Moderate Sway  Condition 4: Foam Surface, EC 30 Sec, Moderate and Severe Sway    PATIENT SURVEYS:  N/A  TODAY'S TREATMENT:  Evaluation and HEP initiated      GOALS: Goals reviewed with patient? Yes  SHORT TERM GOALS: Target date: 09/24/2021  Patient will be independent in HEP to improve functional outcomes Baseline: initiated after evaluation Goal status: INITIAL  2.  Manifest improved BLE strength and balance per time 14 sec 5xSTS test Baseline: 17.65 sec Goal status: INITIAL  3. Demonstrate modified independent ambulation over level surfaces x 300 ft and ascending/descending 10 stairs with HR to improve safety with ambulation in home  Baseline: supervision  Goal status: INITIAL  LONG TERM GOALS: Target date: 10/08/2021  Reduce risk for falls during functional mobility per score 45/56 Berg Balance Test Baseline: 36/56 indicating high risk Goal status: INITIAL  2.  Improve safety with ambulation per time 12 sec TUG test to reduce risk for falls Baseline: 16.94 sec Goal status: INITIAL  3.  Increase bilateral hip abductor strength to 4/5 for improved stance control and single limb support stability for gait cycle Baseline: 4-/5 RLE; 3+/5 LLE Goal status: INITIAL  4.  Ambulate over grassy terrain with supervision using cane to improve safety in community Baseline: CGA-min A w/ SPC Goal status: INITIAL   ASSESSMENT:  CLINICAL  IMPRESSION: Continues to demo poor HEP recall requiring verbal/visual cues for set-up and sequence. Progressed with coordination and balance activities with good progression of boxing sequence demonstrating good coordination and accuracy of movement. Continued sessions for HEP development and progression of balance and gait activities to reduce risk for falls   OBJECTIVE IMPAIRMENTS Abnormal gait, decreased activity tolerance, decreased balance, decreased coordination, decreased endurance, decreased mobility, difficulty walking, decreased strength, increased muscle spasms, impaired tone, and postural dysfunction.   ACTIVITY LIMITATIONS carrying, lifting, standing, squatting, stairs, reach over head, and locomotion level  PARTICIPATION LIMITATIONS: meal prep, cleaning, laundry, shopping, and community activity  PERSONAL FACTORS Past/current experiences, Time since onset of injury/illness/exacerbation, and 1 comorbidity: MS  are also affecting patient's functional outcome.   REHAB POTENTIAL: Good  CLINICAL DECISION MAKING: Evolving/moderate complexity  EVALUATION COMPLEXITY: Moderate  PLAN: PT FREQUENCY: 1-2x/week  PT DURATION: 6 weeks  PLANNED INTERVENTIONS: Therapeutic exercises, Therapeutic activity, Neuromuscular re-education, Balance training, Gait training, Patient/Family education, Self Care, Joint mobilization, Stair training, Vestibular training, Canalith repositioning, DME instructions, Aquatic Therapy, Dry Needling, Electrical stimulation, Spinal mobilization, Cryotherapy, Moist heat, Taping, and Manual therapy  PLAN FOR NEXT SESSION: Continue with balance, gait, and strength training   10:17 AM, 09/29/21 M. Shary Decamp, PT, DPT Physical Therapist- [place] Office Number: [phone]

## 2021-10-01 ENCOUNTER — Ambulatory Visit: Payer: Medicare (Managed Care)

## 2021-10-01 DIAGNOSIS — R2689 Other abnormalities of gait and mobility: Secondary | ICD-10-CM

## 2021-10-01 DIAGNOSIS — R2681 Unsteadiness on feet: Secondary | ICD-10-CM | POA: Diagnosis not present

## 2021-10-01 DIAGNOSIS — M6281 Muscle weakness (generalized): Secondary | ICD-10-CM

## 2021-10-01 DIAGNOSIS — R262 Difficulty in walking, not elsewhere classified: Secondary | ICD-10-CM

## 2021-10-01 NOTE — Therapy (Signed)
OUTPATIENT PHYSICAL THERAPY NEURO TREATMENT   Patient Name: Miguel Black MRN: [id number] DOB:25-Jul-1990, 31 y.o., male Today's Date: 10/01/2021   PCP: None listed REFERRING PROVIDER: Dorcas Carrow, MD    PT End of Session - 10/01/21 1019     Visit Number 6    Number of Visits 12    Date for PT Re-Evaluation 10/15/21    Authorization Type [employer]vantage; Medicaid secondary    Authorization Time Period auth required    Progress Note Due on Visit 10    PT Start Time 1015    PT Stop Time 1100    PT Time Calculation (min) 45 min    Equipment Utilized During Treatment Gait belt    Activity Tolerance Patient tolerated treatment well    Behavior During Therapy WFL for tasks assessed/performed             Past Medical History:  Diagnosis Date   MS (multiple sclerosis) (HCC)    Seizure (HCC)    Status epilepticus (HCC) 07/12/2018   Past Surgical History:  Procedure Laterality Date   NO PAST SURGERIES     Patient Active Problem List   Diagnosis Date Noted   Acute respiratory failure with hypoxia (HCC) 08/23/2021   Status epilepticus (HCC) 08/21/2021   History of optic neuritis 07/29/2020   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  Rationale for Evaluation and Treatment Rehabilitation  SUBJECTIVE:                                                                                                                                                                                               SUBJECTIVE STATEMENT: A little sore from last session  Pt accompanied by: self  PERTINENT HISTORY: hx of seizures, MS  PAIN:  Are you having pain? No  PRECAUTIONS: Fall  WEIGHT BEARING RESTRICTIONS No  FALLS: Has patient fallen in last 6 months? No  LIVING ENVIRONMENT: Lives with: lives with their family Lives in: House/apartment Stairs: Yes: Internal: 12 steps; on right going up and External: 4 steps; on right going up and [facility] Has following  equipment at home: Single point cane  PLOF: Independent with household mobility without device, Independent with community mobility with device, and Independent with homemaking with ambulation  PATIENT GOALS Improve balance and walking to enable trips in community/going to the store  OBJECTIVE:    TODAY'S TREATMENT: 10/01/21 Activity Comments  NU-step intervals x 8 min Heavy: light 1:1 for resistance and alt coordination  Weight stack 2x10 Cross punch w/ rotation 10# Single arm row, staggered stance Half-kneeling lat row 15#  Tall kneeling by EOM UE support x 30 sec Overhead press 2x10 Alt punches                PATIENT EDUCATION: Education details: assessment findings/fall risk; HEP initiation Person educated: Patient Education method: Explanation, Handouts Education comprehension: verbalized understanding and returned demonstration   HOME EXERCISE PROGRAM: Access Code: [email or web address] URL: [email or web address] Date: 09/03/2021 Prepared by: Shary Decamp  Exercises - Supine Bridge with Resistance Band  - 1 x daily - 7 x weekly - 3 sets - 10 reps - 3 sec hold Access Code: [email or web address] - Prone Hamstring Curl with Anchored Resistance  - 1 x daily - 7 x weekly - 3 sets - 10 reps - 2 sec hold - Supine Hip Flexion with Resistance Loop  - 1 x daily - 7 x weekly - 3 sets - 10 reps Access Code: [email or web address] - Quadruped Alternating Arm Lift  - 1 x daily - 7 x  weekly - 1-3 sets - 10 reps - Quadruped Alternating Leg Extensions  - 1 x daily - 7 x weekly - 1-3 sets - 10 reps  DIAGNOSTIC FINDINGS:  Stable non contrast CT appearance of the brain since February with evidence of chronic demyelinating disease. No acute intracranial abnormality.  COGNITION: Overall cognitive status: Within functional limits for tasks assessed   SENSATION: Chronic changes from MS per  pt report  COORDINATION: LLE deficits    MUSCLE TONE: LLE: Mild and Moderate       POSTURE: anterior pelvic tilt  LOWER EXTREMITY ROM:    WNL  LOWER EXTREMITY MMT:    MMT Right Eval Left Eval  Hip flexion 5 4  Hip extension 4 4-  Hip abduction 4- 3+  Hip adduction 4 4  Hip internal rotation    Hip external rotation    Knee flexion 5 3+  Knee extension 5 3+  Ankle dorsiflexion 5 3  Ankle plantarflexion 4 3  Ankle inversion    Ankle eversion    (Blank rows = not tested)  BED MOBILITY:  Independent  TRANSFERS: Assistive device utilized: Single point cane  Sit to stand: Complete Independence Stand to sit: Complete Independence Chair to chair: Complete Independence and Modified independence Floor: Modified independence  RAMP:  Level of Assistance: SBA Assistive device utilized: Single point cane Ramp Comments: wide BOS, excessive trunk extension to compensate LE weakness  CURB:  Level of Assistance: SBA and CGA Assistive device utilized: Single point cane Curb Comments:   STAIRS:  Level of Assistance: SBA and CGA  Stair Negotiation Technique: Alternating Pattern  with Single Rail on Right Single Rail on Left  Number of Stairs: 10   Height of Stairs: 4-6"  Comments: improved stability when with right HR  GAIT: Gait pattern: decreased arm swing- Left, decreased stance time- Left, and wide BOS Distance walked: 150 Assistive device utilized: Single point cane Level of assistance: SBA and CGA Comments: Uneven/grassy surfaces w/ CGA-min A with LOB  during turns  FUNCTIONAL TESTs:  5 times sit  to stand: 17.65 sec w/out UE Timed up and go (TUG): 16.94 sec Berg Balance Scale: 36/56 indicating  M-CTSIB  Condition 1: Firm Surface, EO 30 Sec, Normal Sway  Condition 2: Firm Surface, EC 30 Sec, Mild Sway  Condition 3: Foam Surface, EO 30 Sec, Mild and Moderate Sway  Condition 4: Foam Surface, EC 30 Sec, Moderate and Severe Sway    PATIENT SURVEYS:  N/A  TODAY'S TREATMENT:  Evaluation and HEP initiated      GOALS: Goals reviewed with patient? Yes  SHORT TERM GOALS: Target date: 09/24/2021  Patient will be independent in HEP to improve functional outcomes Baseline: initiated after evaluation Goal status: INITIAL  2.  Manifest improved BLE strength and balance per time 14 sec 5xSTS test Baseline: 17.65 sec Goal status: INITIAL  3. Demonstrate modified independent ambulation over level surfaces x 300 ft and ascending/descending 10 stairs with HR to improve safety with ambulation in home  Baseline: supervision  Goal status: INITIAL  LONG TERM GOALS: Target date: 10/08/2021  Reduce risk for falls during functional mobility per score 45/56 Berg Balance Test Baseline: 36/56 indicating high risk Goal status: INITIAL  2.  Improve safety with ambulation per time 12 sec TUG test to reduce risk for falls Baseline: 16.94 sec Goal status: INITIAL  3.  Increase bilateral hip abductor strength to 4/5 for improved stance control and single limb support stability for gait cycle Baseline: 4-/5 RLE; 3+/5 LLE Goal status: INITIAL  4.  Ambulate over grassy terrain with supervision using cane to improve safety in community Baseline: CGA-min A w/ SPC Goal status: INITIAL   ASSESSMENT:  CLINICAL IMPRESSION: Tx emphasis on improving rotational strength, coordination, and planning with crossing midline and facilitation of weight shifting using external resistance.  Therapist needed to stabilize and facilitate postural stability  especially with half-kneeling position w/ frequent tactile cues to hip extensors for upright. Notices fatigue with resistance training tolerating 2x10 reps but notes could not complete 3rd set at this time.  Difficulty with coordinating proximal engagement in tall/half kneeling. Continued sessions to facilitate motor control and strength to enhance dynamic balance and reduce risk for falls   OBJECTIVE IMPAIRMENTS Abnormal gait, decreased activity tolerance, decreased balance, decreased coordination, decreased endurance, decreased mobility, difficulty walking, decreased strength, increased muscle spasms, impaired tone, and postural dysfunction.   ACTIVITY LIMITATIONS carrying, lifting, standing, squatting, stairs, reach over head, and locomotion level  PARTICIPATION LIMITATIONS: meal prep, cleaning, laundry, shopping, and community activity  PERSONAL FACTORS Past/current experiences, Time since onset of injury/illness/exacerbation, and 1 comorbidity: MS  are also affecting patient's functional outcome.   REHAB POTENTIAL: Good  CLINICAL DECISION MAKING: Evolving/moderate complexity  EVALUATION COMPLEXITY: Moderate  PLAN: PT FREQUENCY: 1-2x/week  PT DURATION: 6 weeks  PLANNED INTERVENTIONS: Therapeutic exercises, Therapeutic activity, Neuromuscular re-education, Balance training, Gait training, Patient/Family education, Self Care, Joint mobilization, Stair training, Vestibular training, Canalith repositioning, DME instructions, Aquatic Therapy, Dry Needling, Electrical stimulation, Spinal mobilization, Cryotherapy, Moist heat, Taping, and Manual therapy  PLAN FOR NEXT SESSION: Continue with balance, gait, and strength training   10:20 AM, 10/01/21 M. Shary Decamp, PT, DPT Physical Therapist- [place] Office Number: [phone]

## 2021-10-04 ENCOUNTER — Ambulatory Visit: Payer: Medicare (Managed Care)

## 2021-10-06 ENCOUNTER — Ambulatory Visit: Payer: Medicare (Managed Care)

## 2021-10-11 ENCOUNTER — Ambulatory Visit: Payer: Medicare (Managed Care)

## 2021-10-11 DIAGNOSIS — R262 Difficulty in walking, not elsewhere classified: Secondary | ICD-10-CM

## 2021-10-11 DIAGNOSIS — R2681 Unsteadiness on feet: Secondary | ICD-10-CM

## 2021-10-11 DIAGNOSIS — R2689 Other abnormalities of gait and mobility: Secondary | ICD-10-CM

## 2021-10-11 DIAGNOSIS — M6281 Muscle weakness (generalized): Secondary | ICD-10-CM

## 2021-10-11 NOTE — Therapy (Signed)
OUTPATIENT PHYSICAL THERAPY NEURO TREATMENT   Patient Name: Miguel Black MRN: [id number] DOB:1990-02-26, 31 y.o., male Today's Date: 10/11/2021   PCP: None listed REFERRING PROVIDER: Dorcas Carrow, MD    PT End of Session - 10/11/21 1016     Visit Number 7    Number of Visits 12    Date for PT Re-Evaluation 10/15/21    Authorization Type [employer] Advantage; Medicaid secondary    Authorization Time Period auth required    Progress Note Due on Visit 10    PT Start Time 1016    PT Stop Time 1100    PT Time Calculation (min) 44 min    Equipment Utilized During Treatment Gait belt    Activity Tolerance Patient tolerated treatment well    Behavior During Therapy WFL for tasks assessed/performed             Past Medical History:  Diagnosis Date   MS (multiple sclerosis) (HCC)    Seizure (HCC)    Status epilepticus (HCC) 07/12/2018   Past Surgical History:  Procedure Laterality Date   NO PAST SURGERIES     Patient Active Problem List   Diagnosis Date Noted   Acute respiratory failure with hypoxia (HCC) 08/23/2021   Status epilepticus (HCC) 08/21/2021   History of optic neuritis 07/29/2020   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   High risk medication use 01/28/2020   Seizure disorder (HCC) 12/04/2019   Marijuana use 12/04/2019   Seizures (HCC) 01/22/2019   Gait disturbance 01/22/2019   Left-sided weakness 01/22/2019   Other fatigue 01/22/2019   Aspiration pneumonia (HCC) 07/12/2018   Relapsing remitting multiple sclerosis (HCC) 07/12/2018   Encounter for screening for HIV 07/12/2018    ONSET DATE: 08/21/21  REFERRING DIAG: G40.909 (ICD-10-CM) - Seizure disorder (HCC) R26.9 (ICD-10-CM) - Gait disturbance   THERAPY DIAG:  Unsteadiness on feet  Muscle weakness (generalized)  Difficulty in walking, not elsewhere classified  Other abnormalities of gait and mobility  Rationale for Evaluation and Treatment  Rehabilitation  SUBJECTIVE:                                                                                                                                                                                              SUBJECTIVE STATEMENT: Had an abscess along the left ribs that was lanced, feeling better now, but still sore  Pt accompanied by: self  PERTINENT HISTORY: hx of seizures, MS  PAIN:  Are you having pain? No  PRECAUTIONS: Fall  WEIGHT BEARING RESTRICTIONS No  FALLS: Has patient fallen in last 6 months? No  LIVING ENVIRONMENT: Lives with:  lives with their family Lives in: House/apartment Stairs: Yes: Internal: 12 steps; on right going up and External: 4 steps; on right going up and SPC Has following equipment at home: Single point cane  PLOF: Independent with household mobility without device, Independent with community mobility with device, and Independent with homemaking with ambulation  PATIENT GOALS Improve balance and walking to enable trips in community/going to the store  OBJECTIVE:   TODAY'S TREATMENT: 10/11/21 Activity Comments  Dynamic balance warm-up x 2 min ea -forward/backward high-step -sidestep -tandem walk  Standing gastroc stretch 2 min on slantboard  Standing on foam -EO/EC x 15 sec -head turns EO/EC x5 reps -step downs 2x5  Step-ups 2x10 6" box, BUE rails, facilitation to left knee extension   LAQ 1x10, 10# LLE  Seated hip flex 1x10, 10# LLE     TODAY'S TREATMENT: 10/01/21 Activity Comments  NU-step intervals x 8 min Heavy: light 1:1 for resistance and alt coordination  Weight stack 2x10 Cross punch w/ rotation 10# Single arm row, staggered stance Half-kneeling lat row 15#  Tall kneeling by EOM UE support x 30 sec Overhead press 2x10 Alt punches                PATIENT EDUCATION: Education details: assessment findings/fall risk; HEP initiation Person educated: Patient Education method: Explanation, Handouts Education  comprehension: verbalized understanding and returned demonstration   HOME EXERCISE PROGRAM: Access Code: [email or web address] URL: [email or web address] Date: 09/03/2021 Prepared by: Shary Decamp  Exercises - Supine Bridge with Resistance Band  - 1 x daily - 7 x weekly - 3 sets - 10 reps - 3 sec hold Access Code: [email or web address] - Prone Hamstring Curl with Anchored Resistance  - 1 x daily - 7 x weekly - 3 sets - 10 reps - 2 sec hold - Supine Hip Flexion with Resistance Loop  - 1 x daily - 7 x weekly - 3 sets - 10 reps Access Code: [email or web address] - Quadruped Alternating Arm Lift  - 1 x daily - 7 x weekly - 1-3 sets - 10 reps - Quadruped Alternating Leg Extensions  - 1 x daily - 7 x weekly - 1-3 sets - 10 reps  DIAGNOSTIC FINDINGS:  Stable non contrast CT appearance of the brain since February with evidence of chronic demyelinating disease. No acute intracranial abnormality.  COGNITION: Overall cognitive status: Within functional limits for tasks assessed   SENSATION: Chronic changes from MS per  pt report  COORDINATION: LLE deficits    MUSCLE TONE: LLE: Mild and Moderate       POSTURE: anterior pelvic tilt  LOWER EXTREMITY ROM:    WNL  LOWER EXTREMITY MMT:    MMT Right Eval Left Eval  Hip flexion 5 4  Hip extension 4 4-  Hip abduction 4- 3+  Hip adduction 4 4  Hip internal rotation    Hip external rotation    Knee flexion 5 3+  Knee extension 5 3+  Ankle dorsiflexion 5 3  Ankle plantarflexion 4 3  Ankle inversion    Ankle eversion    (Blank rows = not tested)  BED MOBILITY:  Independent  TRANSFERS: Assistive device utilized: Single point cane  Sit to stand: Complete Independence Stand to sit: Complete Independence Chair to chair: Complete Independence and Modified independence Floor: Modified independence  RAMP:  Level of Assistance: SBA Assistive device utilized: Single point cane Ramp Comments: wide BOS, excessive trunk extension to compensate LE  weakness  CURB:  Level of Assistance: SBA and CGA Assistive device  utilized: Single point cane Curb Comments:   STAIRS:  Level of Assistance: SBA and CGA  Stair Negotiation Technique: Alternating Pattern  with Single Rail on Right Single Rail on Left  Number of Stairs: 10   Height of Stairs: 4-6"  Comments: improved stability when with right HR  GAIT: Gait pattern: decreased arm swing- Left, decreased stance time- Left, and wide BOS Distance walked: 150 Assistive device utilized: Single point cane Level of assistance: SBA and CGA Comments: Uneven/grassy surfaces w/ CGA-min A with LOB during turns  FUNCTIONAL TESTs:  5 times sit to stand: 17.65 sec w/out UE Timed up and go (TUG): 16.94 sec Berg Balance Scale: 36/56 indicating  M-CTSIB  Condition 1: Firm Surface, EO 30 Sec, Normal Sway  Condition 2: Firm Surface, EC 30 Sec, Mild Sway  Condition 3: Foam Surface, EO 30 Sec, Mild and Moderate Sway  Condition 4: Foam Surface, EC 30 Sec, Moderate and Severe Sway    PATIENT SURVEYS:  N/A  TODAY'S TREATMENT:  Evaluation and HEP initiated      GOALS: Goals reviewed with patient? Yes  SHORT TERM GOALS: Target date: 09/24/2021  Patient will be independent in HEP to improve functional outcomes Baseline: initiated after evaluation Goal status: on-going  2.  Manifest improved BLE strength and balance per time 14 sec 5xSTS test Baseline: 17.65 sec Goal status: on-going  3. Demonstrate modified independent ambulation over level surfaces x 300 ft and ascending/descending 10 stairs with HR to improve safety with ambulation in home  Baseline: supervision  Goal status: on-going  LONG TERM GOALS: Target date: 10/08/2021  Reduce risk for falls during functional mobility per score 45/56 Berg Balance Test Baseline: 36/56 indicating high risk Goal status: INITIAL  2.  Improve safety with ambulation per time 12 sec TUG test to reduce risk for falls Baseline: 16.94 sec Goal  status: INITIAL  3.  Increase bilateral hip abductor strength to 4/5 for improved stance control and single limb support stability for gait cycle Baseline: 4-/5 RLE; 3+/5 LLE Goal status: INITIAL  4.  Ambulate over grassy terrain with supervision using cane to improve safety in community Baseline: CGA-min A w/ SPC Goal status: INITIAL   ASSESSMENT:  CLINICAL IMPRESSION: Reduced activities requiring UE involvement and trunk rotation due to recent skin infection and wound on left ribs causing discomfort. Activities emphasis on facilitation of LLE recruitment in compound movements and static balance to improve postural stability followed by isolated strengthening exercises with increased weight, again for optimal recruitment.  Pt notes that he walked to store near his home before coming to session today and this caused quite a degreeof fatigue and limitation. Discussed pursuing use of scooter or motorized w/c for communinty distance. Continued sessions to progress dynamic balance, coordintion, and reduce risk for falls   OBJECTIVE IMPAIRMENTS Abnormal gait, decreased activity tolerance, decreased balance, decreased coordination, decreased endurance, decreased mobility, difficulty walking, decreased strength, increased muscle spasms, impaired tone, and postural dysfunction.   ACTIVITY LIMITATIONS carrying, lifting, standing, squatting, stairs, reach over head, and locomotion level  PARTICIPATION LIMITATIONS: meal prep, cleaning, laundry, shopping, and community activity  PERSONAL FACTORS Past/current experiences, Time since onset of injury/illness/exacerbation, and 1 comorbidity: MS  are also affecting patient's functional outcome.   REHAB POTENTIAL: Good  CLINICAL DECISION MAKING: Evolving/moderate complexity  EVALUATION COMPLEXITY: Moderate  PLAN: PT FREQUENCY: 1-2x/week  PT DURATION: 6 weeks  PLANNED INTERVENTIONS: Therapeutic exercises, Therapeutic activity, Neuromuscular  re-education, Balance training, Gait training, Patient/Family education, Self Care, Joint mobilization, Stair training,  Vestibular training, Canalith repositioning, DME instructions, Aquatic Therapy, Dry Needling, Electrical stimulation, Spinal mobilization, Cryotherapy, Moist heat, Taping, and Manual therapy  PLAN FOR NEXT SESSION: 5xSTS   10:17 AM, 10/11/21 M. Sherlyn Lees, PT, DPT Physical Therapist- [place] Office Number: [phone]

## 2021-10-13 ENCOUNTER — Ambulatory Visit: Payer: Medicare (Managed Care)

## 2021-10-13 DIAGNOSIS — R2681 Unsteadiness on feet: Secondary | ICD-10-CM | POA: Diagnosis not present

## 2021-10-13 DIAGNOSIS — M6281 Muscle weakness (generalized): Secondary | ICD-10-CM

## 2021-10-13 NOTE — Therapy (Addendum)
OUTPATIENT PHYSICAL THERAPY NEURO TREATMENT, Progress Note, and Recertification   Patient Name: Miguel Black MRN: [id number] DOB:12-Jul-1990, 31 y.o., male Today's Date: 10/13/2021   PCP: None listed REFERRING PROVIDER: Barb Merino, MD  Progress Note Reporting Period 09/03/21 to 10/13/21  See note below for Objective Data and Assessment of Progress/Goals.       PT End of Session - 10/13/21 1018     Visit Number 8    Number of Visits 20    Date for PT Re-Evaluation 11/17/21    Authorization Type [employer] Advantage; Medicaid secondary    Authorization Time Period auth required    Progress Note Due on Visit 18    PT Start Time 1017    PT Stop Time 1100    PT Time Calculation (min) 43 min    Equipment Utilized During Treatment Gait belt    Activity Tolerance Patient tolerated treatment well    Behavior During Therapy WFL for tasks assessed/performed             Past Medical History:  Diagnosis Date   MS (multiple sclerosis) ([place])    Seizure ([place])    Status epilepticus ([place]) 07/12/2018   Past Surgical History:  Procedure Laterality Date   NO PAST SURGERIES     Patient Active Problem List   Diagnosis Date Noted   Acute respiratory failure with hypoxia ([place]) 08/23/2021   Status epilepticus ([place]) 08/21/2021   History of optic neuritis 07/29/2020   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   High risk medication use 01/28/2020   Seizure disorder ([place]) 12/04/2019   Marijuana use 12/04/2019   Seizures ([place]) 01/22/2019   Gait disturbance 01/22/2019   Left-sided weakness 01/22/2019   Other fatigue 01/22/2019   Aspiration pneumonia ([place]) 07/12/2018   Relapsing remitting multiple sclerosis ([place]) 07/12/2018   Encounter for screening for HIV 07/12/2018    ONSET DATE: 08/21/21  REFERRING DIAG: G40.909 (ICD-10-CM) - Seizure disorder ([place]) R26.9 (ICD-10-CM) - Gait disturbance   THERAPY DIAG:  Unsteadiness on feet  Muscle weakness  (generalized)  Rationale for Evaluation and Treatment Rehabilitation  SUBJECTIVE:                                                                                                                                                                                              SUBJECTIVE STATEMENT: The left side is sore but healing from the recent skin issue  Pt accompanied by: self  PERTINENT HISTORY: hx of seizures, MS  PAIN:  Are you having pain? No  PRECAUTIONS: Fall  WEIGHT BEARING RESTRICTIONS No  FALLS: Has patient fallen in last  6 months? No  LIVING ENVIRONMENT: Lives with: lives with their family Lives in: House/apartment Stairs: Yes: Internal: 12 steps; on right going up and External: 4 steps; on right going up and SPC Has following equipment at home: Single point cane  PLOF: Independent with household mobility without device, Independent with community mobility with device, and Independent with homemaking with ambulation  PATIENT GOALS Improve balance and walking to enable trips in community/going to the store  OBJECTIVE:   TODAY'S TREATMENT: 10/13/21 Activity Comments  5xSTS 12 sec  Gait training -Modified indep x 340 ft in 3 min 17 sec; 1.72 ft/sec using cane -stair ambulation modified indep w/ reciprocal pattern using single HR  TUG test 15.28 sec w/ cane  Berg Balance Test 41/56  HEP review 25% recall         PATIENT EDUCATION: Education details: Recertification review Person educated: Patient Education method: Explanation, Handouts Education comprehension: verbalized understanding and returned demonstration   HOME EXERCISE PROGRAM: Access Code: [email or web address] URL: [email or web address] Date: 09/03/2021 Prepared by: Kelly Halpin  Exercises - Supine Bridge with Resistance Band  - 1 x daily - 7 x weekly - 3 sets - 10 reps - 3 sec hold Access Code: [email or web address] - Prone Hamstring Curl with Anchored Resistance  - 1 x daily - 7 x weekly - 3 sets -  10 reps - 2 sec hold - Supine Hip Flexion with Resistance Loop  - 1 x daily - 7 x weekly - 3 sets - 10 reps Access Code: [email or web address] - Quadruped Alternating Arm Lift  - 1 x daily - 7 x weekly - 1-3 sets - 10 reps - Quadruped Alternating Leg Extensions  - 1 x daily - 7 x weekly - 1-3 sets - 10 reps  DIAGNOSTIC FINDINGS:  Stable non contrast CT appearance of the brain since February with evidence of chronic demyelinating disease. No acute intracranial abnormality.  COGNITION: Overall cognitive status: Within functional limits for tasks assessed   SENSATION: Chronic changes from MS per  pt report  COORDINATION: LLE deficits    MUSCLE TONE: LLE: Mild and Moderate       POSTURE: anterior pelvic tilt  LOWER EXTREMITY ROM:    WNL  LOWER EXTREMITY MMT:    MMT Right Eval Left Eval  Hip flexion 5 4  Hip extension 4 4-  Hip abduction 4- 3+  Hip adduction 4 4  Hip internal rotation    Hip external rotation    Knee flexion 5 3+  Knee extension 5 3+  Ankle dorsiflexion 5 3  Ankle plantarflexion 4 3  Ankle inversion    Ankle eversion    (Blank rows = not tested)  10/13/21: LLE demonstrates 4+/5 quadriceps, 4-/5 hip abduction  BED MOBILITY:  Independent  TRANSFERS: Assistive device utilized: Single point cane  Sit to stand: Complete Independence Stand to sit: Complete Independence Chair to chair: Complete Independence and Modified independence Floor: Modified independence  RAMP:  Level of Assistance: SBA Assistive device utilized: Single point cane Ramp Comments: wide BOS, excessive trunk extension to compensate LE weakness  CURB:  Level of Assistance: SBA and CGA Assistive device utilized: Single point cane Curb Comments:   STAIRS:  Level of Assistance: SBA and CGA  Stair Negotiation Technique: Alternating Pattern  with Single Rail on Right Single Rail on Left  Number of Stairs: 10   Height of Stairs: 4-6"  Comments: improved stability when with right  HR  GAIT: Gait pattern: decreased arm swing- Left, decreased stance time- Left,   and wide BOS Distance walked: 150 Assistive device utilized: Single point cane Level of assistance: SBA and CGA Comments: Uneven/grassy surfaces w/ CGA-min A with LOB during turns  FUNCTIONAL TESTs:  5 times sit to stand: 17.65 sec w/out UE Timed up and go (TUG): 16.94 sec Berg Balance Scale: 36/56 indicating  M-CTSIB  Condition 1: Firm Surface, EO 30 Sec, Normal Sway  Condition 2: Firm Surface, EC 30 Sec, Mild Sway  Condition 3: Foam Surface, EO 30 Sec, Mild and Moderate Sway  Condition 4: Foam Surface, EC 30 Sec, Moderate and Severe Sway    PATIENT SURVEYS:  N/A  TODAY'S TREATMENT:  Evaluation and HEP initiated      GOALS: Goals reviewed with patient? Yes  SHORT TERM GOALS: Target date: 09/24/2021  Patient will be independent in HEP to improve functional outcomes Baseline: initiated after evaluation Goal status: on-going  2.  Manifest improved BLE strength and balance per time 14 sec 5xSTS test Baseline: 17.65 sec; (10/13/21) 12.25 sec Goal status: MET  3. Demonstrate modified independent ambulation over level surfaces x 300 ft and ascending/descending 10 stairs with HR to improve safety with ambulation in home  Baseline: supervision; (10/13/21) modified independent w/ HR and/or cane  Goal status: MET  LONG TERM GOALS: Target date: 11/17/21  Reduce risk for falls during functional mobility per score 45/56 Berg Balance Test Baseline: 36/56 indicating high risk; (10/13/21) 41/56 Goal status: On-going  2.  Improve safety with ambulation per time 12 sec TUG test to reduce risk for falls Baseline: 16.94 sec; (10/13/21) 15.28 sec w/ cane Goal status: On-going  3.  Increase bilateral hip abductor strength to 4/5 for improved stance control and single limb support stability for gait cycle Baseline: 4-/5 RLE; 3+/5 LLE Goal status: On-going  4.  Ambulate over grassy terrain with supervision  using cane to improve safety in community Baseline: CGA-min A w/ SPC Goal status: on-going   ASSESSMENT:  CLINICAL IMPRESSION: Recertification and Progress Note completed on today's date demonstrating overall improved status and meeting 2/3 STG and progressing with LTG.  Demonstrates continued balance and motor control deficits with LOB experienced with narrow BOS demands and crossing midline.  Demonstrates improved score with all standardized tests w/ improved times on 5xSTS, TUG test, and Berg Balance Test scores indicating decreased risk for falls, although with high risk for falls still present per tests. Continued sessions indicated to progress dynamic balance, strength, and general activity tolerance to facilitate return to community-level activities and ambulation   OBJECTIVE IMPAIRMENTS Abnormal gait, decreased activity tolerance, decreased balance, decreased coordination, decreased endurance, decreased mobility, difficulty walking, decreased strength, increased muscle spasms, impaired tone, and postural dysfunction.   ACTIVITY LIMITATIONS carrying, lifting, standing, squatting, stairs, reach over head, and locomotion level  PARTICIPATION LIMITATIONS: meal prep, cleaning, laundry, shopping, and community activity  PERSONAL FACTORS Past/current experiences, Time since onset of injury/illness/exacerbation, and 1 comorbidity: MS  are also affecting patient's functional outcome.   REHAB POTENTIAL: Good  CLINICAL DECISION MAKING: Evolving/moderate complexity  EVALUATION COMPLEXITY: Moderate  PLAN: PT FREQUENCY: 1-2x/week  PT DURATION: 6 weeks  PLANNED INTERVENTIONS: Therapeutic exercises, Therapeutic activity, Neuromuscular re-education, Balance training, Gait training, Patient/Family education, Self Care, Joint mobilization, Stair training, Vestibular training, Canalith repositioning, DME instructions, Aquatic Therapy, Dry Needling, Electrical stimulation, Spinal mobilization,  Cryotherapy, Moist heat, Taping, and Manual therapy  PLAN FOR NEXT SESSION: corner balance/boxing   11:09 AM, 10/13/21 M. Sherlyn Lees, PT, DPT Physical Therapist- [place] Office Number: [phone]

## 2021-10-13 NOTE — Addendum Note (Signed)
Addended by: Toniann Fail on: 10/13/2021 11:30 AM   Modules accepted: Orders

## 2021-10-18 ENCOUNTER — Ambulatory Visit: Payer: Medicare (Managed Care)

## 2021-10-20 ENCOUNTER — Ambulatory Visit: Payer: Medicare (Managed Care)

## 2021-10-20 DIAGNOSIS — M6281 Muscle weakness (generalized): Secondary | ICD-10-CM

## 2021-10-20 DIAGNOSIS — R2681 Unsteadiness on feet: Secondary | ICD-10-CM | POA: Diagnosis not present

## 2021-10-20 NOTE — Therapy (Signed)
OUTPATIENT PHYSICAL THERAPY NEURO TREATMENT   Patient Name: Miguel Black MRN: [id number] DOB:11/02/1990, 31 y.o., male Today's Date: 10/20/2021   PCP: None listed REFERRING PROVIDER: Barb Merino, MD      PT End of Session - 10/20/21 1016     Visit Number 9    Number of Visits 20    Date for PT Re-Evaluation 11/17/21    Authorization Type [employer] Medicare Advantage; Medicaid secondary    Authorization Time Period auth required    Progress Note Due on Visit 18    PT Start Time 1015    PT Stop Time 1100    PT Time Calculation (min) 45 min    Equipment Utilized During Treatment Gait belt    Activity Tolerance Patient tolerated treatment well    Behavior During Therapy WFL for tasks assessed/performed             Past Medical History:  Diagnosis Date   MS (multiple sclerosis) ([place])    Seizure ([place])    Status epilepticus ([place]) 07/12/2018   Past Surgical History:  Procedure Laterality Date   NO PAST SURGERIES     Patient Active Problem List   Diagnosis Date Noted   Acute respiratory failure with hypoxia ([place]) 08/23/2021   Status epilepticus ([place]) 08/21/2021   History of optic neuritis 07/29/2020   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   High risk medication use 01/28/2020   Seizure disorder ([place]) 12/04/2019   Marijuana use 12/04/2019   Seizures ([place]) 01/22/2019   Gait disturbance 01/22/2019   Left-sided weakness 01/22/2019   Other fatigue 01/22/2019   Aspiration pneumonia ([place]) 07/12/2018   Relapsing remitting multiple sclerosis ([place]) 07/12/2018   Encounter for screening for HIV 07/12/2018    ONSET DATE: 08/21/21  REFERRING DIAG: G40.909 (ICD-10-CM) - Seizure disorder ([facility]) R26.9 (ICD-10-CM) - Gait disturbance   THERAPY DIAG:  Unsteadiness on feet  Muscle weakness (generalized)  Difficulty in walking, not elsewhere classified  Other abnormalities of gait and mobility  Rationale for Evaluation and Treatment  Rehabilitation  SUBJECTIVE:                                                                                                                                                                                              SUBJECTIVE STATEMENT: Feeling good  Pt accompanied by: self  PERTINENT HISTORY: hx of seizures, MS  PAIN:  Are you having pain? No  PRECAUTIONS: Fall  WEIGHT BEARING RESTRICTIONS No  FALLS: Has patient fallen in last 6 months? No  LIVING ENVIRONMENT: Lives with: lives with their family Lives in: House/apartment Stairs: Yes: Internal: 12 steps;  on right going up and External: 4 steps; on right going up and SPC Has following equipment at home: Single point cane  PLOF: Independent with household mobility without device, Independent with community mobility with device, and Independent with homemaking with ambulation  PATIENT GOALS Improve balance and walking to enable trips in community/going to the store  OBJECTIVE:   TODAY'S TREATMENT: 10/20/21 Activity Comments  Cable tower strength-standing Resisted row 1x10, 10#; 1x10 20# Resisted cross punch 2x10 10#  Tall kneeling by EOM (knees on pad) -1x30 sec EO/EC -hammer curls 9# 2x10 -overhead press 2x10 9#  Seated boxing strikes Individual punch to combo x 2 min  Standing in corner -Boxing strikes: inidvidual to combo 4x2 min rounds -sidestep and pivot trials:retro-LOB -standing balance: EO/EC x 30 sec, head turns EO/EC, semi-tandem 3x15 sec          TODAY'S TREATMENT: 10/13/21 Activity Comments  5xSTS 12 sec  Gait training -Modified indep x 340 ft in 3 min 17 sec; 1.72 ft/sec using cane -stair ambulation modified indep w/ reciprocal pattern using single HR  TUG test 15.28 sec w/ cane  Berg Balance Test 41/56  HEP review 25% recall         PATIENT EDUCATION: Education details: Recertification review Person educated: Patient Education method: Explanation, Handouts Education comprehension: verbalized  understanding and returned demonstration   HOME EXERCISE PROGRAM: Access Code: [email or web address] URL: [email or web address] Date: 09/03/2021 Prepared by: Sherlyn Lees  Exercises - Supine Bridge with Resistance Band  - 1 x daily - 7 x weekly - 3 sets - 10 reps - 3 sec hold Access Code: [email or web address] - Prone Hamstring Curl with Anchored Resistance  - 1 x daily - 7 x weekly - 3 sets - 10 reps - 2 sec hold - Supine Hip Flexion with Resistance Loop  - 1 x daily - 7 x weekly - 3 sets - 10 reps Access Code: [email or web address] - Quadruped Alternating Arm Lift  - 1 x daily - 7 x weekly - 1-3 sets - 10 reps - Quadruped Alternating Leg Extensions  - 1 x daily - 7 x weekly - 1-3 sets - 10 reps  DIAGNOSTIC FINDINGS:  Stable non contrast CT appearance of the brain since February with evidence of chronic demyelinating disease. No acute intracranial abnormality.  COGNITION: Overall cognitive status: Within functional limits for tasks assessed   SENSATION: Chronic changes from MS per  pt report  COORDINATION: LLE deficits    MUSCLE TONE: LLE: Mild and Moderate       POSTURE: anterior pelvic tilt  LOWER EXTREMITY ROM:    WNL  LOWER EXTREMITY MMT:    MMT Right Eval Left Eval  Hip flexion 5 4  Hip extension 4 4-  Hip abduction 4- 3+  Hip adduction 4 4  Hip internal rotation    Hip external rotation    Knee flexion 5 3+  Knee extension 5 3+  Ankle dorsiflexion 5 3  Ankle plantarflexion 4 3  Ankle inversion    Ankle eversion    (Blank rows = not tested)  10/13/21: LLE demonstrates 4+/5 quadriceps, 4-/5 hip abduction  BED MOBILITY:  Independent  TRANSFERS: Assistive device utilized: Single point cane  Sit to stand: Complete Independence Stand to sit: Complete Independence Chair to chair: Complete Independence and Modified independence Floor: Modified independence  RAMP:  Level of Assistance: SBA Assistive device utilized: Single point cane Ramp Comments: wide BOS,  excessive trunk extension to compensate LE weakness  CURB:  Level of Assistance: SBA  and CGA Assistive device utilized: Single point cane Curb Comments:   STAIRS:  Level of Assistance: SBA and CGA  Stair Negotiation Technique: Alternating Pattern  with Single Rail on Right Single Rail on Left  Number of Stairs: 10   Height of Stairs: 4-6"  Comments: improved stability when with right HR  GAIT: Gait pattern: decreased arm swing- Left, decreased stance time- Left, and wide BOS Distance walked: 150 Assistive device utilized: Single point cane Level of assistance: SBA and CGA Comments: Uneven/grassy surfaces w/ CGA-min A with LOB during turns  FUNCTIONAL TESTs:  5 times sit to stand: 17.65 sec w/out UE Timed up and go (TUG): 16.94 sec Berg Balance Scale: 36/56 indicating  M-CTSIB  Condition 1: Firm Surface, EO 30 Sec, Normal Sway  Condition 2: Firm Surface, EC 30 Sec, Mild Sway  Condition 3: Foam Surface, EO 30 Sec, Mild and Moderate Sway  Condition 4: Foam Surface, EC 30 Sec, Moderate and Severe Sway    PATIENT SURVEYS:  N/A  TODAY'S TREATMENT:  Evaluation and HEP initiated      GOALS: Goals reviewed with patient? Yes  SHORT TERM GOALS: Target date: 09/24/2021  Patient will be independent in HEP to improve functional outcomes Baseline: initiated after evaluation Goal status: on-going  2.  Manifest improved BLE strength and balance per time 14 sec 5xSTS test Baseline: 17.65 sec; (10/13/21) 12.25 sec Goal status: MET  3. Demonstrate modified independent ambulation over level surfaces x 300 ft and ascending/descending 10 stairs with HR to improve safety with ambulation in home  Baseline: supervision; (10/13/21) modified independent w/ HR and/or cane  Goal status: MET  LONG TERM GOALS: Target date: 11/17/21  Reduce risk for falls during functional mobility per score 45/56 Berg Balance Test Baseline: 36/56 indicating high risk; (10/13/21) 41/56 Goal status:  On-going  2.  Improve safety with ambulation per time 12 sec TUG test to reduce risk for falls Baseline: 16.94 sec; (10/13/21) 15.28 sec w/ cane Goal status: On-going  3.  Increase bilateral hip abductor strength to 4/5 for improved stance control and single limb support stability for gait cycle Baseline: 4-/5 RLE; 3+/5 LLE Goal status: On-going  4.  Ambulate over grassy terrain with supervision using cane to improve safety in community Baseline: CGA-min A w/ SPC Goal status: on-going   ASSESSMENT:  CLINICAL IMPRESSION: Treatment session impetus on strengthening for HEP activities performed in tall kneeling to improve hip engagement as well as benefit of reducing risk for lateral LOB per his usual habit and this was to good effect with ability to maintain tall kneeling without LOB and complete activities without UE support approx 50% of the time. Continued with balance and coordination activities to improve body recruitment and safety/ability for changing directions and weight shift/reach outside BOS to improve safety with mobility and ADL.Continued sessions to refine HEP and to transition to community programs   OBJECTIVE IMPAIRMENTS Abnormal gait, decreased activity tolerance, decreased balance, decreased coordination, decreased endurance, decreased mobility, difficulty walking, decreased strength, increased muscle spasms, impaired tone, and postural dysfunction.   ACTIVITY LIMITATIONS carrying, lifting, standing, squatting, stairs, reach over head, and locomotion level  PARTICIPATION LIMITATIONS: meal prep, cleaning, laundry, shopping, and community activity  PERSONAL FACTORS Past/current experiences, Time since onset of injury/illness/exacerbation, and 1 comorbidity: MS  are also affecting patient's functional outcome.   REHAB POTENTIAL: Good  CLINICAL DECISION MAKING: Evolving/moderate complexity  EVALUATION COMPLEXITY: Moderate  PLAN: PT FREQUENCY: 1-2x/week  PT DURATION: 6  weeks  PLANNED INTERVENTIONS: Therapeutic exercises,  Therapeutic activity, Neuromuscular re-education, Balance training, Gait training, Patient/Family education, Self Care, Joint mobilization, Stair training, Vestibular training, Canalith repositioning, DME instructions, Aquatic Therapy, Dry Needling, Electrical stimulation, Spinal mobilization, Cryotherapy, Moist heat, Taping, and Manual therapy  PLAN FOR NEXT SESSION: corner balance/boxing, tall kneeling HEP   10:17 AM, 10/20/21 M. Sherlyn Lees, PT, DPT Physical Therapist- [place] Office Number: [phone]

## 2021-10-26 ENCOUNTER — Ambulatory Visit: Payer: Medicare (Managed Care) | Attending: Internal Medicine

## 2021-10-26 DIAGNOSIS — M6281 Muscle weakness (generalized): Secondary | ICD-10-CM | POA: Insufficient documentation

## 2021-10-26 DIAGNOSIS — R2681 Unsteadiness on feet: Secondary | ICD-10-CM | POA: Diagnosis not present

## 2021-10-26 NOTE — Therapy (Signed)
OUTPATIENT PHYSICAL THERAPY NEURO TREATMENT   Patient Name: Miguel Black MRN: [id number] DOB:1990-08-08, 31 y.o., male Today's Date: 10/26/2021   PCP: None listed REFERRING PROVIDER: Barb Merino, MD      PT End of Session - 10/26/21 1320     Visit Number 10    Number of Visits 20    Date for PT Re-Evaluation 11/17/21    Authorization Type [employer] Advantage; Medicaid secondary    Authorization Time Period auth required    Progress Note Due on Visit 18    PT Start Time 1315    PT Stop Time 1400    PT Time Calculation (min) 45 min    Equipment Utilized During Treatment Gait belt    Activity Tolerance Patient tolerated treatment well    Behavior During Therapy WFL for tasks assessed/performed             Past Medical History:  Diagnosis Date   MS (multiple sclerosis) ([place])    Seizure ([place])    Status epilepticus ([place]) 07/12/2018   Past Surgical History:  Procedure Laterality Date   NO PAST SURGERIES     Patient Active Problem List   Diagnosis Date Noted   Acute respiratory failure with hypoxia ([place]) 08/23/2021   Status epilepticus ([place]) 08/21/2021   History of optic neuritis 07/29/2020   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   High risk medication use 01/28/2020   Seizure disorder ([place]) 12/04/2019   Marijuana use 12/04/2019   Seizures ([place]) 01/22/2019   Gait disturbance 01/22/2019   Left-sided weakness 01/22/2019   Other fatigue 01/22/2019   Aspiration pneumonia ([place]) 07/12/2018   Relapsing remitting multiple sclerosis ([place]) 07/12/2018   Encounter for screening for HIV 07/12/2018    ONSET DATE: 08/21/21  REFERRING DIAG: G40.909 (ICD-10-CM) - Seizure disorder (HCC) R26.9 (ICD-10-CM) - Gait disturbance   THERAPY DIAG:  Unsteadiness on feet  Muscle weakness (generalized)  Rationale for Evaluation and Treatment Rehabilitation  SUBJECTIVE:                                                                                                                                                                                               SUBJECTIVE STATEMENT: Laid back weekend   Pt accompanied by: self  PERTINENT HISTORY: hx of seizures, MS  PAIN:  Are you having pain? No  PRECAUTIONS: Fall  WEIGHT BEARING RESTRICTIONS No  FALLS: Has patient fallen in last 6 months? No  LIVING ENVIRONMENT: Lives with: lives with their family Lives in: House/apartment Stairs: Yes: Internal: 12 steps; on right going up and External: 4 steps; on right going up  and SPC Has following equipment at home: Single point cane  PLOF: Independent with household mobility without device, Independent with community mobility with device, and Independent with homemaking with ambulation  PATIENT GOALS Improve balance and walking to enable trips in community/going to the store  OBJECTIVE:   TODAY'S TREATMENT: 10/26/21 Activity Comments  Corner balance  -feet apart/together: EO/EC 2x30 sec -boxing strikes and slip/bob--reactive/proactive balance -standing on foam: feet apart/together, EO/EC. Head turns EO/EC -rolling physio ball up/down wall 10x  -double hand bounce pysioball for coordination -sitting on physioball  Righting reaction 10-15# resistance on cable w/ belt forwards/backwards walking to provide external perturbation and facilitate hip/stepping strategy  Alt stair taps x 2 min CGA                 PATIENT EDUCATION: Education details: Recertification review Person educated: Patient Education method: Explanation, Handouts Education comprehension: verbalized understanding and returned demonstration   HOME EXERCISE PROGRAM: Access Code: [email or web address] URL: [email or web address] Date: 09/03/2021 Prepared by: Sherlyn Lees  Exercises - Supine Bridge with Resistance Band  - 1 x daily - 7 x weekly - 3 sets - 10 reps - 3 sec hold Access Code: [email or web address] - Prone Hamstring Curl with Anchored Resistance  - 1 x daily - 7  x weekly - 3 sets - 10 reps - 2 sec hold - Supine Hip Flexion with Resistance Loop  - 1 x daily - 7 x weekly - 3 sets - 10 reps Access Code: [email or web address] - Quadruped Alternating Arm Lift  - 1 x daily - 7 x weekly - 1-3 sets - 10 reps - Quadruped Alternating Leg Extensions  - 1 x daily - 7 x weekly - 1-3 sets - 10 reps  DIAGNOSTIC FINDINGS:  Stable non contrast CT appearance of the brain since February with evidence of chronic demyelinating disease. No acute intracranial abnormality.  COGNITION: Overall cognitive status: Within functional limits for tasks assessed   SENSATION: Chronic changes from MS per  pt report  COORDINATION: LLE deficits    MUSCLE TONE: LLE: Mild and Moderate       POSTURE: anterior pelvic tilt  LOWER EXTREMITY ROM:    WNL  LOWER EXTREMITY MMT:    MMT Right Eval Left Eval  Hip flexion 5 4  Hip extension 4 4-  Hip abduction 4- 3+  Hip adduction 4 4  Hip internal rotation    Hip external rotation    Knee flexion 5 3+  Knee extension 5 3+  Ankle dorsiflexion 5 3  Ankle plantarflexion 4 3  Ankle inversion    Ankle eversion    (Blank rows = not tested)  10/13/21: LLE demonstrates 4+/5 quadriceps, 4-/5 hip abduction  BED MOBILITY:  Independent  TRANSFERS: Assistive device utilized: Single point cane  Sit to stand: Complete Independence Stand to sit: Complete Independence Chair to chair: Complete Independence and Modified independence Floor: Modified independence  RAMP:  Level of Assistance: SBA Assistive device utilized: Single point cane Ramp Comments: wide BOS, excessive trunk extension to compensate LE weakness  CURB:  Level of Assistance: SBA and CGA Assistive device utilized: Single point cane Curb Comments:   STAIRS:  Level of Assistance: SBA and CGA  Stair Negotiation Technique: Alternating Pattern  with Single Rail on Right Single Rail on Left  Number of Stairs: 10   Height of Stairs: 4-6"  Comments: improved stability  when with right HR  GAIT: Gait pattern: decreased arm swing- Left, decreased stance time- Left, and wide BOS Distance walked: 150  Assistive device utilized: Single point cane Level of assistance: SBA and CGA Comments: Uneven/grassy surfaces w/ CGA-min A with LOB during turns  FUNCTIONAL TESTs:  5 times sit to stand: 17.65 sec w/out UE Timed up and go (TUG): 16.94 sec Berg Balance Scale: 36/56 indicating  M-CTSIB  Condition 1: Firm Surface, EO 30 Sec, Normal Sway  Condition 2: Firm Surface, EC 30 Sec, Mild Sway  Condition 3: Foam Surface, EO 30 Sec, Mild and Moderate Sway  Condition 4: Foam Surface, EC 30 Sec, Moderate and Severe Sway    PATIENT SURVEYS:  N/A  TODAY'S TREATMENT:  Evaluation and HEP initiated      GOALS: Goals reviewed with patient? Yes  SHORT TERM GOALS: Target date: 09/24/2021  Patient will be independent in HEP to improve functional outcomes Baseline: initiated after evaluation Goal status: on-going  2.  Manifest improved BLE strength and balance per time 14 sec 5xSTS test Baseline: 17.65 sec; (10/13/21) 12.25 sec Goal status: MET  3. Demonstrate modified independent ambulation over level surfaces x 300 ft and ascending/descending 10 stairs with HR to improve safety with ambulation in home  Baseline: supervision; (10/13/21) modified independent w/ HR and/or cane  Goal status: MET  LONG TERM GOALS: Target date: 11/17/21  Reduce risk for falls during functional mobility per score 45/56 Berg Balance Test Baseline: 36/56 indicating high risk; (10/13/21) 41/56 Goal status: On-going  2.  Improve safety with ambulation per time 12 sec TUG test to reduce risk for falls Baseline: 16.94 sec; (10/13/21) 15.28 sec w/ cane Goal status: On-going  3.  Increase bilateral hip abductor strength to 4/5 for improved stance control and single limb support stability for gait cycle Baseline: 4-/5 RLE; 3+/5 LLE Goal status: On-going  4.  Ambulate over grassy terrain  with supervision using cane to improve safety in community Baseline: CGA-min A w/ SPC Goal status: on-going   ASSESSMENT:  CLINICAL IMPRESSION: Improving with static balance on foam surfaces and eyes closed condition with only 1-2 errors during 30 sec bouts with feet apart and eyes closed conditions.  Continued with tx session to focus on anticipatory/reactive balance strategies and incorporating hip/stepping strategies with LOB against external resistance.  Able to improve speed for hip extension/stepping with assistance to facilitate.  Continued sessions to progress dynamic balance and improve speed and facilitation of righting reactions   OBJECTIVE IMPAIRMENTS Abnormal gait, decreased activity tolerance, decreased balance, decreased coordination, decreased endurance, decreased mobility, difficulty walking, decreased strength, increased muscle spasms, impaired tone, and postural dysfunction.   ACTIVITY LIMITATIONS carrying, lifting, standing, squatting, stairs, reach over head, and locomotion level  PARTICIPATION LIMITATIONS: meal prep, cleaning, laundry, shopping, and community activity  PERSONAL FACTORS Past/current experiences, Time since onset of injury/illness/exacerbation, and 1 comorbidity: MS  are also affecting patient's functional outcome.   REHAB POTENTIAL: Good  CLINICAL DECISION MAKING: Evolving/moderate complexity  EVALUATION COMPLEXITY: Moderate  PLAN: PT FREQUENCY: 1-2x/week  PT DURATION: 6 weeks  PLANNED INTERVENTIONS: Therapeutic exercises, Therapeutic activity, Neuromuscular re-education, Balance training, Gait training, Patient/Family education, Self Care, Joint mobilization, Stair training, Vestibular training, Canalith repositioning, DME instructions, Aquatic Therapy, Dry Needling, Electrical stimulation, Spinal mobilization, Cryotherapy, Moist heat, Taping, and Manual therapy  PLAN FOR NEXT SESSION: corner balance/boxing, tall kneeling HEP   1:22 PM,  10/26/21 M. Sherlyn Lees, PT, DPT Physical Therapist- [place] Office Number: [phone]

## 2021-11-14 ENCOUNTER — Other Ambulatory Visit: Payer: Self-pay

## 2021-11-14 ENCOUNTER — Emergency Department (HOSPITAL_COMMUNITY): Payer: Medicare (Managed Care)

## 2021-11-14 ENCOUNTER — Encounter (HOSPITAL_COMMUNITY): Payer: Self-pay

## 2021-11-14 ENCOUNTER — Emergency Department (HOSPITAL_COMMUNITY)
Admission: EM | Admit: 2021-11-14 | Discharge: 2021-11-15 | Disposition: A | Discharge status: Home / Self Care | Payer: Medicare (Managed Care) | Attending: Emergency Medicine | Admitting: Emergency Medicine

## 2021-11-14 DIAGNOSIS — R4182 Altered mental status, unspecified: Secondary | ICD-10-CM | POA: Diagnosis not present

## 2021-11-14 DIAGNOSIS — R569 Unspecified convulsions: Secondary | ICD-10-CM | POA: Insufficient documentation

## 2021-11-14 LAB — CBC WITH DIFFERENTIAL/PLATELET
Abs Immature Granulocytes: 0.07 10*3/uL (ref 0.00–0.07)
Basophils Absolute: 0 10*3/uL (ref 0.0–0.1)
Basophils Relative: 0 %
Eosinophils Absolute: 0 10*3/uL (ref 0.0–0.5)
Eosinophils Relative: 0 %
HCT: 48.6 % (ref 39.0–52.0)
Hemoglobin: 16.3 g/dL (ref 13.0–17.0)
Immature Granulocytes: 1 %
Lymphocytes Relative: 6 %
Lymphs Abs: 0.6 10*3/uL — ABNORMAL LOW (ref 0.7–4.0)
MCH: 32.4 pg (ref 26.0–34.0)
MCHC: 33.5 g/dL (ref 30.0–36.0)
MCV: 96.6 fL (ref 80.0–100.0)
Monocytes Absolute: 0.4 10*3/uL (ref 0.1–1.0)
Monocytes Relative: 4 %
Neutro Abs: 9.6 10*3/uL — ABNORMAL HIGH (ref 1.7–7.7)
Neutrophils Relative %: 89 %
Platelets: 261 10*3/uL (ref 150–400)
RBC: 5.03 MIL/uL (ref 4.22–5.81)
RDW: 12.6 % (ref 11.5–15.5)
WBC: 10.7 10*3/uL — ABNORMAL HIGH (ref 4.0–10.5)
nRBC: 0 % (ref 0.0–0.2)

## 2021-11-14 LAB — COMPREHENSIVE METABOLIC PANEL
ALT: 17 U/L (ref 0–44)
AST: 25 U/L (ref 15–41)
Albumin: 4.4 g/dL (ref 3.5–5.0)
Alkaline Phosphatase: 63 U/L (ref 38–126)
Anion gap: 10 (ref 5–15)
BUN: 9 mg/dL (ref 6–20)
CO2: 24 mmol/L (ref 22–32)
Calcium: 9.2 mg/dL (ref 8.9–10.3)
Chloride: 104 mmol/L (ref 98–111)
Creatinine, Ser: 1.07 mg/dL (ref 0.61–1.24)
GFR, Estimated: >60 mL/min (ref >60)
Glucose, Bld: 108 mg/dL — ABNORMAL HIGH (ref 70–99)
Potassium: 5.4 mmol/L — ABNORMAL HIGH (ref 3.5–5.1)
Sodium: 138 mmol/L (ref 135–145)
Total Bilirubin: 0.4 mg/dL (ref 0.3–1.2)
Total Protein: 7.5 g/dL (ref 6.5–8.1)

## 2021-11-14 MED ORDER — LEVETIRACETAM IN NACL 1000 MG/100ML IV SOLN
1000.0000 mg | Freq: Once | INTRAVENOUS | Status: AC
  Administered 2021-11-14: 1000 mg via INTRAVENOUS
  Filled 2021-11-14: qty 100

## 2021-11-14 NOTE — ED Notes (Signed)
Jasher Barkan (Mother) called asking for an update. Her number is [phone].

## 2021-11-14 NOTE — ED Notes (Signed)
Patient returned from CT

## 2021-11-14 NOTE — ED Provider Notes (Signed)
[facility] EMERGENCY DEPARTMENT Provider Note   CSN: [id number] Arrival date & time: 11/14/21  1735     History  Chief Complaint  Patient presents with   Seizures    Miguel Black is a 31 y.o. male.  Patient is a 31 year old male with a history of seizures on Keppra and MS on Ocrevus who is presenting today for seizure.  EMS reported that patient lives at home with his parents and told his dad this morning that he felt odd but mom went to check on him and he was having a focal seizure.  When EMS arrived he had another focal seizure and they gave him 5 mg of midazolam IM.  They then reported he started having more generalized seizure activity and gave another 5 mg of midazolam IM.  This did not stop and when IV was established they gave an additional 5 mg for a total of 15 mg of midazolam.  Upon arrival to the emergency room patient was somnolent but breathing.  No seizure activity at this moment.  It was reported that patient's baseline is ANO x4 he does have to use assist devices for ambulating.  Last seizure was approximately 7 months ago and mom had reported he has not missed any doses of his Keppra.  It is unclear if patient has had any recent illness nausea vomiting or fever.  Will confirm with parents upon arrival.  The history is provided by the EMS personnel and a parent.  Seizures      Home Medications Prior to Admission medications   Medication Sig Start Date End Date Taking? Authorizing Provider  cholecalciferol (VITAMIN D3) 25 MCG (1000 UNIT) tablet Take 5,000 Units by mouth daily.    [provider]  levETIRAcetam (KEPPRA) 1000 MG tablet Take 1 tablet (1,000 mg total) by mouth 2 (two) times daily. 08/23/21   Sater, Nanine Means, MD  OCREVUS 300 MG/10ML injection INFUSE 600MG  IN 500ML 0.9% NS INTRAVENOUSLY AT 40ML\HR AND INCREASE BY 40ML\HR EVERY 30 MINUTES (MAX 200ML\HR) OVER AT LEAST 3.5HRS AS DIRECTED EVERY 6 MONTHS. 06/07/21   Sater, Nanine Means, MD      Allergies    Dalfampridine    Review of Systems   Review of Systems  Neurological:  Positive for seizures.    Physical Exam Updated Vital Signs BP 128/81   Pulse 71   Temp 98.3 F (36.8 C) (Axillary)   Resp 13   SpO2 100%  Physical Exam Vitals and nursing note reviewed.  Constitutional:      General: He is not in acute distress.    Appearance: He is well-developed.     Comments: Patient does respond to tactile stimulation but no verbal stimulation  HENT:     Head: Normocephalic and atraumatic.     Comments: Drooling.  No evidence of tongue biting Eyes:     Conjunctiva/sclera: Conjunctivae normal.     Comments: Right pupil is 3 mm and reactive, left pupil is 5 mm and minimally reactive with slight downward and lateral gaze  Cardiovascular:     Rate and Rhythm: Normal rate and regular rhythm.     Heart sounds: No murmur heard. Pulmonary:     Effort: Pulmonary effort is normal. No respiratory distress.     Breath sounds: Normal breath sounds. No wheezing or rales.  Abdominal:     General: There is no distension.     Palpations: Abdomen is soft.     Tenderness: There is no  abdominal tenderness. There is no guarding or rebound.  Musculoskeletal:        General: No tenderness. Normal range of motion.     Cervical back: Normal range of motion and neck supple.  Skin:    General: Skin is warm and dry.     Findings: No erythema or rash.  Neurological:     Comments: Patient is noted to move his arms spontaneously.  Also legs were noted to move spontaneously.  He does not follow any commands.  Gag is intact  Psychiatric:     Comments: Altered mental status     ED Results / Procedures / Treatments   Labs (all labs ordered are listed, but only abnormal results are displayed) Labs Reviewed  CBC WITH DIFFERENTIAL/PLATELET - Abnormal; Notable for the following components:      Result Value   WBC 10.7 (*)    Neutro Abs 9.6 (*)    Lymphs Abs 0.6 (*)    All other  components within normal limits  COMPREHENSIVE METABOLIC PANEL - Abnormal; Notable for the following components:   Potassium 5.4 (*)    Glucose, Bld 108 (*)    All other components within normal limits  URINALYSIS, ROUTINE W REFLEX MICROSCOPIC    EKG None  Radiology CT Head Wo Contrast  Result Date: 11/14/2021 CLINICAL DATA:  Mental status change.  Two seizures today. EXAM: CT HEAD WITHOUT CONTRAST TECHNIQUE: Contiguous axial images were obtained from the base of the skull through the vertex without intravenous contrast. RADIATION DOSE REDUCTION: This exam was performed according to the departmental dose-optimization program which includes automated exposure control, adjustment of the mA and/or kV according to patient size and/or use of iterative reconstruction technique. COMPARISON:  08/22/2021 FINDINGS: Brain: No evidence of acute infarction, hemorrhage, hydrocephalus, extra-axial collection or mass lesion/mass effect. Unchanged asymmetric hypodensity in the periventricular white matter on the left. Vascular: No hyperdense vessel or unexpected calcification. Skull: Normal. Negative for fracture or focal lesion. Sinuses/Orbits: Polypoid mucosal thickening right maxillary sinus. Mild mucosal thickening bilateral sphenoid sinuses. Other: None. IMPRESSION: 1. No acute intracranial abnormality. 2. Unchanged asymmetric hypodensity in the periventricular white matter on the left. Electronically Signed   By: Marin Roberts M.D.   On: 11/14/2021 19:29   DG Chest Port 1 View  Result Date: 11/14/2021 CLINICAL DATA:  Seizure. EXAM: PORTABLE CHEST 1 VIEW COMPARISON:  Chest radiograph dated 08/23/2021. FINDINGS: The heart size and mediastinal contours are within normal limits. Both lungs are clear. The visualized skeletal structures are unremarkable. IMPRESSION: No active disease. Electronically Signed   By: Anner Crete M.D.   On: 11/14/2021 19:02    Procedures Procedures    Medications Ordered in  ED Medications  levETIRAcetam (KEPPRA) IVPB 1000 mg/100 mL premix (0 mg Intravenous Stopped 11/14/21 1901)    ED Course/ Medical Decision Making/ A&P                           Medical Decision Making Amount and/or Complexity of Data Reviewed External Data Reviewed: notes. Labs: ordered. Decision-making details documented in ED Course. Radiology: ordered and independent interpretation performed. Decision-making details documented in ED Course. ECG/medicine tests: ordered and independent interpretation performed. Decision-making details documented in ED Course.  Risk Prescription drug management.   Patient presenting today after seizure activity at home.  Patient received 15 mg of Versed prior to arrival due to seizure activity.  Does have a history of seizures and is taking Keppra which  his mom reports he has been compliant with.  Last seizure was 7 months ago.  Patient does have abnormal neurologic finding of the left eye however unclear if that is chronic because of his MS or something new.  We will do a head CT because family is not currently present to give further history.  Also will check to ensure no electrolyte abnormalities or evidence for infection that could have precipitated seizure.  Normally patient is ANO x4.  Patient loaded with 1 g of Keppra.  We will watch closely but currently he is maintaining his airway and sats are 98% on 2 L.  10:31 PM I independently interpreted patient's labs today and CBC without acute findings, CMP with mild hyperkalemia but it was hemolyzed, creatinine is normal anion gap. I have independently visualized and interpreted pt's images today.  Head CT today without evidence of acute hemorrhage and radiology reports unchanged hypodensities from his known MS, chest x-ray is within normal limits.  On repeat evaluation patient is now more awake and able to answer some questions but is still groggy.  Still feels that he needs to metabolize some of the Versed.   However does not appear to be in status today and appears to be clearing towards his baseline.  At this time do not feel that patient is able to stand and ambulate yet and we will continue to monitor.         Final Clinical Impression(s) / ED Diagnoses Final diagnoses:  None    Rx / DC Orders ED Discharge Orders     None         Blanchie Dessert, MD 11/14/21 2239

## 2021-11-14 NOTE — ED Triage Notes (Signed)
Pt BIBA from home. Hx of MS and Seizures. Per EMS pt was home and stated felt weid. Mom went to check on him and he was having a focal seizure. When EMS arrived pt had another seizure. 5mg  Midazolam given IM x2. Once in the Truck Pt had full seizure with body movement. 5MG  Midazolam given IV. Pt non verbal at this time. Has not regained consciousness. Pt normally A&Ox4. Last seizure 7 months ago. Pt on keppra. No dose mixed per EMS.  126/80 90 CBG- 132 90% RA 4L 95%

## 2021-11-15 NOTE — ED Notes (Signed)
Patient's mother contacted for ride home

## 2021-11-15 NOTE — ED Notes (Signed)
Patient provided with cup of water. Patient is able to drink water. PO challenge passed.

## 2021-11-15 NOTE — ED Notes (Signed)
Parent's mother Miguel Black) provided with updated about patient's plan of care via phone call

## 2021-11-15 NOTE — ED Provider Notes (Signed)
Patient is now awake and alert at his baseline.  He is taking p.o. fluids.  He will be discharged home.  Spoke to his mother via phone.  She plans to have him follow-up with neurology as he has been med compliant. Patient awake and alert, no distress, no signs of any traumatic injury.   Ripley Fraise, MD 11/15/21 [phone]

## 2021-11-15 NOTE — ED Notes (Signed)
Patient able to stand up at side of bed with stand-by assistance. Patient is A&OX4 and stating he is ready to be discharged. Patient's mentation is returned to baseline

## 2021-11-15 NOTE — ED Provider Notes (Signed)
EKG Interpretation  Date/Time:  Sunday November 14 2021 23:39:48 EDT Ventricular Rate:  71 PR Interval:  105 QRS Duration: 85 QT Interval:  402 QTC Calculation: 437 R Axis:   70 Text Interpretation: Sinus rhythm Short PR interval ST elev, probable normal early repol pattern Confirmed by Ripley Fraise [phone]) on 11/15/2021 12:02:02 AM       I assumed care in signout.  Patient is still resting comfortably after receiving Versed.  Vital signs appropriate.  We will continue to monitor.   Ripley Fraise, MD 11/15/21 0010

## 2021-12-02 NOTE — Therapy (Signed)
[facility] [address], [address] [place], [place], [address] Phone: [phone]   Fax:  [phone]  Patient Details  Name: Miguel Black MRN: [id number] Date of Birth: 12/09/1990 Referring Provider:  No ref. provider found  Encounter Date: 12/02/2021 PHYSICAL THERAPY DISCHARGE SUMMARY  Visits from Start of Care: 10  Current functional level related to goals / functional outcomes: Unable to determine   Remaining deficits: Balance, gait, mobility deficits, high risk for falls   Education / Equipment: HEP   Patient agrees to discharge. Patient goals were  unknown . Patient is being discharged due to not returning since the last visit.   Dion Body, PT 12/02/2021, 2:35 PM  [facility] [address] [place], [place], [address] Phone: [phone]   Fax:  [phone]

## 2022-01-05 ENCOUNTER — Telehealth: Payer: Self-pay | Admitting: Neurology

## 2022-01-05 ENCOUNTER — Ambulatory Visit (INDEPENDENT_AMBULATORY_CARE_PROVIDER_SITE_OTHER): Payer: Medicare (Managed Care) | Admitting: Neurology

## 2022-01-05 ENCOUNTER — Encounter: Payer: Self-pay | Admitting: Neurology

## 2022-01-05 VITALS — BP 124/82 | HR 64 | Ht 69.0 in | Wt 161.5 lb

## 2022-01-05 DIAGNOSIS — G40909 Epilepsy, unspecified, not intractable, without status epilepticus: Secondary | ICD-10-CM | POA: Diagnosis not present

## 2022-01-05 DIAGNOSIS — R269 Unspecified abnormalities of gait and mobility: Secondary | ICD-10-CM

## 2022-01-05 DIAGNOSIS — G35 Multiple sclerosis: Secondary | ICD-10-CM | POA: Diagnosis not present

## 2022-01-05 DIAGNOSIS — Z79899 Other long term (current) drug therapy: Secondary | ICD-10-CM | POA: Diagnosis not present

## 2022-01-05 MED ORDER — LEVETIRACETAM 1000 MG PO TABS: 1000.0000 mg | ORAL_TABLET | Freq: Two times a day (BID) | ORAL | 4 refills | Status: DC

## 2022-01-05 MED ORDER — ZONISAMIDE 100 MG PO CAPS: 100.0000 mg | ORAL_CAPSULE | Freq: Every day | ORAL | 3 refills | Status: DC

## 2022-01-05 NOTE — Telephone Encounter (Signed)
Referral for physical therapy sent through EPIC to [facility]. Phone: [phone] 

## 2022-01-05 NOTE — Progress Notes (Signed)
[facility]  PATIENT: Miguel Black DOB: [birth year]  REFERRING DOCTOR OR PCP:  Cain Saupe (PCP) SOURCE: Patient, notes from emergency room, lab results and imaging studies personally reviewed.  _________________________________   HISTORICAL  CHIEF COMPLAINT:  Chief Complaint  Patient presents with   Follow-up    Pt in room #10 and alone. Pt here today for f/u on Ocrevus for his MS and  his seizures.    HISTORY OF PRESENT ILLNESS:  He is a 31 y.o. man with multiple sclerosis and seizures.  Update 01/05/2022: He feels his MS is stable.     He is on Ocrevus, last dose 11/22/2021.Marland Kitchen   No exacerbations or new neurologic symptoms.   However, he does feel the left leg is a little bit weaker and more spastic than last year which affects his gait  Gait is worse since the seizure in October.  He would like to do more PT.   He uses a cane to ambulate due to ataxia more than weakness though he has both.   He has left leg > arm weakness.   He has left leg stiffness/spasticity as well. He can go across the room without a cane and > 200 feet with it.  Marland Kitchen  He notes some slowing of the left hand and it feels heavier - unchanged.   Bladder function is fine.   He notes vision is reduced on the left and he has color desaturation out of the left eye.   His right eye has good vision.      He had another  seizure 10/222023. He had an aura with this one a minute before and laid down in his bed.  He may had missed a dose before the seizure but now is much more cautious.   In 03/07/2021 he had status epilepticus.  In February, h was on dalfampridine x 1 week and does not think it helped much.    He had GTC.   He remembers the part of  the seizure (more likel the post ictal state) and feeling like being transported to a different dimension ad dreamlike state.  He was intubated and in ICU x 2 days.  He also had Covid at the time.  He still feels weaker than in February.    His previous  seizure was 12/03/2019.  Afterwards, he remained groggy and slept 11 hours.   He returned to his baseline over the next day.     He has had 5-6 seizures overall since first one  03/07/2016  He notes fatigue some afternoons but is good in the mornings.  Fatigue is physical > cognitive.   He sleeps well.    He feels focus and attention are good though cognitive skills sometimes are a little off.   He has mild depression at times.         MS history:  He was diagnosed with relapsing remitting MS in June 2015 after presenting with reduced vision OS while he was in [facility] [place].   In retrospect he had had some neurologic symptoms a few years before the disgnosis.   He had trouble running before his diagnosis.    He was placed on Tecfidera but he had breakthrough activity.     Just a year later, he was switched to Tysabri and has done better.  He moved to [facility]  June 2019 and started seeing the MS Center at [facility] (Dr. Gaynelle Adu).   He has not had  any exacerbations and feels his MS has been stable.   Initially, he continue Tysabri.  He tolerated Tysabri well but going back and forth to an infusion center monthly with difficulty.    He has been JCV Ab negative.   His last infusion was off Tysabri was in summer 2019 and he switched to Ocrevus at the end of 2019.   His last JCV Ab test I have a result for is 07/04/2017 (negative 0.14).   I saw him 12/2018 and he transferred infusions to [facility].      Seizure history:  His first one was 2018 and second one was 07/12/2018.   He notes being dehydrated the second one.    Both seizures were GTC by description with LOC.    He slept > a day after each one.   He was started on Keppra but stopped after a week or so due to side effects.  He had a third seizure November 2021 when he was found by his dad in a postictal state.  He was placed on Keppra has continued to take it.   Imaging MRI of the brain 07/13/2018 showed multiple T2/flair hyperintense foci.  Posterior fossa  foci are noted in the posterior right medulla, bilateral cerebellar hemispheres, pons, right cerebellar peduncle and cerebral peduncle.  There are foci in the thalamus and multiple foci in the periventricular, juxtacortical and deep white matter.  None of the foci appear to be acute or enhance.  MRI brain 08/30/2020 showed Multiple T2/FLAIR hyperintense foci in the cerebral hemispheres, thalamus, brainstem and cerebellum in a pattern and configuration consistent with chronic demyelinating plaque associated with multiple sclerosis. None of the foci enhance or appear to be acute. However, 1 focus in the juxtacortical white matter of the posterior left frontal lobe was present on the current scan but not on the MRI from 07/13/2018    Lab tests: Hepatitis B labs from 2020 at [facility] showed no evidence of chronic infection.  :  TB Gold was negative 01/22/2019.  Vitamin D 01/22/2019 was 30.1.  IgG/IgA/IgM was normal 01/22/2019.  CD19/CD20 showed less then 0.5% of lymphocytes were B cells.    REVIEW OF SYSTEMS: Constitutional: No fevers, chills, sweats, or change in appetite Eyes: He notes reduced vision OS.  He sometimes has double vision. Ear, nose and throat: No hearing loss, ear pain, nasal congestion, sore throat Cardiovascular: No chest pain, palpitations Respiratory:  No shortness of breath at rest or with exertion.   No wheezes GastrointestinaI: No nausea, vomiting, diarrhea, abdominal pain, fecal incontinence Genitourinary:  No dysuria, urinary retention or frequency.  No nocturia. Musculoskeletal:  No neck pain, back pain Integumentary: No rash, pruritus, skin lesions Neurological: as above Psychiatric: No depression at this time.  No anxiety Endocrine: No palpitations, diaphoresis, change in appetite, change in weigh or increased thirst Hematologic/Lymphatic:  No anemia, purpura, petechiae. Allergic/Immunologic: No itchy/runny eyes, nasal congestion, recent allergic reactions,  rashes  ALLERGIES: Allergies  Allergen Reactions   Dalfampridine Other (See Comments)    Seizure 02/2021 while on dalfampridine    HOME MEDICATIONS:  Current Outpatient Medications:    cholecalciferol (VITAMIN D3) 25 MCG (1000 UNIT) tablet, Take 5,000 Units by mouth daily., Disp: , Rfl:    OCREVUS 300 MG/10ML injection, INFUSE 600MG  IN 0.9% NS INTRAVENOUSLY AT 40ML\HR AND INCREASE BY 40ML\HR EVERY 30 MINUTES (MAX 200ML\HR) OVER AT LEAST 3.5HRS AS DIRECTED EVERY 6 MONTHS., Disp: 20 mL, Rfl: 1   zonisamide (ZONEGRAN) 100 MG capsule, Take  1 capsule (100 mg total) by mouth daily., Disp: 90 capsule, Rfl: 3   levETIRAcetam (KEPPRA) 1000 MG tablet, Take 1 tablet (1,000 mg total) by mouth 2 (two) times daily., Disp: 180 tablet, Rfl: 4  PAST MEDICAL HISTORY: Past Medical History:  Diagnosis Date   MS (multiple sclerosis) (HCC)    Seizure (HCC)    Status epilepticus (HCC) 07/12/2018    PAST SURGICAL HISTORY: Past Surgical History:  Procedure Laterality Date   NO PAST SURGERIES      FAMILY HISTORY: Family History  Problem Relation Age of Onset   Hypertension Mother    Healthy Father    Healthy Sister    Healthy Brother    Diabetes Maternal Grandmother    Hypertension Maternal Grandfather    Hodgkin's lymphoma Brother    Colon cancer Maternal Uncle 45    SOCIAL HISTORY:  Social History   Socioeconomic History   Marital status: Unknown    Spouse name: Not on file   Number of children: 0   Years of education: 11   Highest education level: Not on file  Occupational History   Occupation: Unemployed  Tobacco Use   Smoking status: Some Days    Types: Cigars   Smokeless tobacco: Never   Tobacco comments:    2.5-3 per day  Vaping Use   Vaping Use: Never used  Substance and Sexual Activity   Alcohol use: Yes    Comment: sometimes    Drug use: Yes    Types: Marijuana    Comment: Reports he smokes a lot, cannot put a number to it   Sexual activity: Not on file   Other Topics Concern   Not on file  Social History Narrative   Left handed   Coffee every day   Lives with family    Social Determinants of Health   Financial Resource Strain: Not on file  Food Insecurity: Not on file  Transportation Needs: Not on file  Physical Activity: Not on file  Stress: Not on file  Social Connections: Not on file  Intimate Partner Violence: Not on file     PHYSICAL EXAM  Vitals:   01/05/22 0831  BP: 124/82  Pulse: 64  Weight: 161 lb 8 oz (73.3 kg)  Height: 5\' 9"  (1.753 m)    Body mass index is 23.85 kg/m.   General: The patient is well-developed and well-nourished and in no acute distress  HEENT:  Head is [place]/AT.  Sclera are anicteric.     Skin: Extremities are without rash or edema.  Neurologic Exam  Mental status: The patient is alert and oriented x 3 at the time of the examination. The patient has apparent normal recent and remote memory, with an apparently normal attention span and concentration ability.   Speech is normal.  Cranial nerves: Extraocular movements show right incomplete INO. Pupils are equal, round, and reactive to light and accomodation.  Mild reduced acuity and color saturation OS vs OD.  Facial strength was normal.  There is good facial sensation to soft touch bilaterally. Trapezius and sternocleidomastoid strength is normal. No dysarthria is noted.   No obvious hearing deficits are noted.  Motor:  Muscle bulk is normal.   Tone is increased in legs. Strength is  5 / 5 in all 4 extremities.   Sensory: Sensory testing is intact to pinprick, soft touch and vibration sensation in all 4 extremities.  Coordination: Cerebellar testing reveals reduced finger-nose-finger and heel-to-shin lon he left.  Gait and station: Station  is normal.  Gait is spastic with a left foot drop... He is unable to tandem without the cane.  Romberg is borderline.   Reflexes: Deep tendon reflexes are increased in legs, left > right; crossed  adductors (L>R) and nonsustained left ankle clonus.      DIAGNOSTIC DATA (LABS, IMAGING, TESTING) - I reviewed patient records, labs, notes, testing and imaging myself where available.  Lab Results  Component Value Date   WBC 10.7 (H) 11/14/2021   HGB 16.3 11/14/2021   HCT 48.6 11/14/2021   MCV 96.6 11/14/2021   PLT 261 11/14/2021      Component Value Date/Time   NA 138 11/14/2021 2120   NA 140 07/22/2019 1502   K 5.4 (H) 11/14/2021 2120   CL 104 11/14/2021 2120   CO2 24 11/14/2021 2120   GLUCOSE 108 (H) 11/14/2021 2120   BUN 9 11/14/2021 2120   BUN 12 07/22/2019 1502   CREATININE 1.07 11/14/2021 2120   CALCIUM 9.2 11/14/2021 2120   PROT 7.5 11/14/2021 2120   PROT 7.2 07/22/2019 1502   ALBUMIN 4.4 11/14/2021 2120   ALBUMIN 4.9 07/22/2019 1502   AST 25 11/14/2021 2120   ALT 17 11/14/2021 2120   ALKPHOS 63 11/14/2021 2120   BILITOT 0.4 11/14/2021 2120   BILITOT <0.2 07/22/2019 1502   GFRNONAA >60 11/14/2021 2120   GFRAA 99 07/22/2019 1502     Lab Results  Component Value Date   TSH 0.625 01/22/2019       ASSESSMENT AND PLAN  Relapsing remitting multiple sclerosis (HCC) - Plan: IgG, IgA, IgM, CBC with Differential/Platelet, Ambulatory referral to Physical Therapy, MR BRAIN W WO CONTRAST  Seizure disorder (HCC) - Plan: MR BRAIN W WO CONTRAST  Gait disturbance - Plan: Ambulatory referral to Physical Therapy, MR BRAIN W WO CONTRAST  High risk medication use - Plan: IgG, IgA, IgM, CBC with Differential/Platelet   1. He will continue Ocrevus.   Today we will check IgM/IgG/IgA and CBC with differential today.  Check MRI of the brain  to determine if there is any breakthrough activity.  If this is occurring we would need to consider a different disease modifying therapy 2.  Seizure:   Continue Keppra 1000 mg p.o. twice daily.    Avoid dalfampridine (also avoid tramadol and Wellbutrin).   Add zonisamide nightly 3.   We will continue to use the cane.  We did discuss  the possibility of using a walker for more stability.  Avoid dalfampridine.  Stay active, exercise as tolerated.   Physical therapy.    4.     Return to see [place] in 6 months or sooner for new or worsening neurologic symptoms.     Breuna Loveall A. Epimenio Foot, MD, [facility] 01/05/2022, 9:01 AM Certified in Neurology, Clinical Neurophysiology, Sleep Medicine and Neuroimaging  [facility] [address] [place], [place] [address] [phone]

## 2022-01-06 LAB — CBC WITH DIFFERENTIAL/PLATELET
Basophils Absolute: 0 10*3/uL (ref 0.0–0.2)
Basos: 1 %
EOS (ABSOLUTE): 0.1 10*3/uL (ref 0.0–0.4)
Eos: 1 %
Hematocrit: 49.2 % (ref 37.5–51.0)
Hemoglobin: 16.9 g/dL (ref 13.0–17.7)
Immature Grans (Abs): 0 10*3/uL (ref 0.0–0.1)
Immature Granulocytes: 0 %
Lymphocytes Absolute: 1.6 10*3/uL (ref 0.7–3.1)
Lymphs: 26 %
MCH: 31.8 pg (ref 26.6–33.0)
MCHC: 34.3 g/dL (ref 31.5–35.7)
MCV: 93 fL (ref 79–97)
Monocytes Absolute: 0.4 10*3/uL (ref 0.1–0.9)
Monocytes: 7 %
Neutrophils Absolute: 3.9 10*3/uL (ref 1.4–7.0)
Neutrophils: 65 %
Platelets: 257 10*3/uL (ref 150–450)
RBC: 5.32 x10E6/uL (ref 4.14–5.80)
RDW: 13.5 % (ref 11.6–15.4)
WBC: 6 10*3/uL (ref 3.4–10.8)

## 2022-01-06 LAB — IGG, IGA, IGM
IgA/Immunoglobulin A, Serum: 137 mg/dL (ref 90–386)
IgG (Immunoglobin G), Serum: 1188 mg/dL (ref 603–1613)
IgM (Immunoglobulin M), Srm: 20 mg/dL (ref 20–172)

## 2022-01-11 ENCOUNTER — Encounter: Payer: Self-pay | Admitting: *Deleted

## 2022-01-12 ENCOUNTER — Telehealth: Payer: Self-pay | Admitting: Neurology

## 2022-01-12 NOTE — Telephone Encounter (Signed)
Miguel Black: [id number] exp. 01/12/22-04/12/22 medicaid NPR sent to [facility] [phone]

## 2022-01-19 ENCOUNTER — Other Ambulatory Visit: Payer: Self-pay

## 2022-01-19 ENCOUNTER — Ambulatory Visit: Payer: Medicare (Managed Care) | Attending: Internal Medicine

## 2022-01-19 DIAGNOSIS — M6281 Muscle weakness (generalized): Secondary | ICD-10-CM | POA: Diagnosis present

## 2022-01-19 DIAGNOSIS — R269 Unspecified abnormalities of gait and mobility: Secondary | ICD-10-CM | POA: Diagnosis not present

## 2022-01-19 DIAGNOSIS — R2681 Unsteadiness on feet: Secondary | ICD-10-CM | POA: Insufficient documentation

## 2022-01-19 DIAGNOSIS — G35 Multiple sclerosis: Secondary | ICD-10-CM | POA: Diagnosis not present

## 2022-01-19 NOTE — Therapy (Signed)
OUTPATIENT PHYSICAL THERAPY NEURO EVALUATION   Patient Name: Miguel Black MRN: [id number] DOB:1990-07-15, 31 y.o., male Today's Date: 01/19/2022   PCP: none listed REFERRING PROVIDER: Asa Lente, MD  END OF SESSION:  PT End of Session - 01/19/22 1016     Visit Number 1    Number of Visits 8    Date for PT Re-Evaluation 03/16/22    Authorization Type [employer] Medicare Advantage; Medicaid secondary    Authorization Time Period auth required    PT Start Time 1015    PT Stop Time 1100    PT Time Calculation (min) 45 min    Equipment Utilized During Treatment Gait belt             Past Medical History:  Diagnosis Date   MS (multiple sclerosis) (HCC)    Seizure (HCC)    Status epilepticus (HCC) 07/12/2018   Past Surgical History:  Procedure Laterality Date   NO PAST SURGERIES     Patient Active Problem List   Diagnosis Date Noted   Acute respiratory failure with hypoxia (HCC) 08/23/2021   Status epilepticus (HCC) 08/21/2021   History of optic neuritis 07/29/2020   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   High risk medication use 01/28/2020   Seizure disorder (HCC) 12/04/2019   Marijuana use 12/04/2019   Seizures (HCC) 01/22/2019   Gait disturbance 01/22/2019   Left-sided weakness 01/22/2019   Other fatigue 01/22/2019   Aspiration pneumonia (HCC) 07/12/2018   Relapsing remitting multiple sclerosis (HCC) 07/12/2018   Encounter for screening for HIV 07/12/2018    ONSET DATE: recurring seizure activity  REFERRING DIAG: G35 (ICD-10-CM) - Relapsing remitting multiple sclerosis (HCC) R26.9 (ICD-10-CM) - Gait disturbance  THERAPY DIAG:  Unsteadiness on feet  Muscle weakness (generalized)  Rationale for Evaluation and Treatment: Rehabilitation  SUBJECTIVE:                                                                                                                                                                                              SUBJECTIVE STATEMENT: Pt experienced increased seizure activity since October and has noted increased weakness and gait disturbance as a result. Pt reports he has been taking anti-seizure medications and receives MS treatment infusions every 6 months Pt accompanied by: self  PERTINENT HISTORY: MS, seizure activity  PAIN:  Are you having pain? No  PRECAUTIONS: Fall  WEIGHT BEARING RESTRICTIONS: No  FALLS: Has patient fallen in last 6 months? Yes. Number of falls unknown, notes, "I take it slow so haven't fallen too many times"  LIVING ENVIRONMENT: Lives with: lives with their family Lives in:  House/apartment Stairs: Yes: Internal: flight steps; on right going up Has following equipment at home: Single point cane and left AFO  PLOF: Independent with basic ADLs, Independent with household mobility with device, and Independent with community mobility with device  PATIENT GOALS: improve strength and balance  OBJECTIVE:   DIAGNOSTIC FINDINGS: imaging to be upcoming for MRI on 01/26/22  COGNITION: Overall cognitive status: Within functional limits for tasks assessed and History of cognitive impairments - at baseline   SENSATION: Light touch: Impaired  Proprioception: Impaired  LLE > RLE COORDINATION: LLE impaired  EDEMA:  none  MUSCLE TONE: LLE: Hypertonic  MUSCLE LENGTH:   DTRs:    POSTURE: anterior pelvic tilt  LOWER EXTREMITY ROM:     WFL  LOWER EXTREMITY MMT:    MMT Right Eval Left Eval  Hip flexion 5 4  Hip extension 5 4  Hip abduction 5 4-  Hip adduction    Hip internal rotation    Hip external rotation    Knee flexion 5 4-  Knee extension 5 4  Ankle dorsiflexion 5 AFO  Ankle plantarflexion 4 AFO  Ankle inversion    Ankle eversion    (Blank rows = not tested)  BED MOBILITY:  independent  TRANSFERS: Assistive device utilized: None  Sit to stand: Modified independence and SBA Stand to sit: Modified independence Chair to chair: Modified  independence Floor: Modified independence --Balance disturbance with sit-stand with pronounced right lateral shift causing LOB  RAMP:  NT  CURB:  Level of Assistance: SBA Assistive device utilized: Single point cane Curb Comments:   STAIRS: Level of Assistance: Modified independence Stair Negotiation Technique: Step to Pattern Alternating Pattern  with Single Rail on Right Number of Stairs: 15  Height of Stairs: 4-6"  Comments:   GAIT: Gait pattern: decreased stride length, decreased hip/knee flexion- Left, circumduction- Left, wide BOS, and abducted- Left Distance walked: 2 Minute Walk Test 190 ft Assistive device utilized: Single point cane Level of assistance: Modified independence and SBA Comments:   FUNCTIONAL TESTS:  5 times sit to stand: 23.66 Timed up and go (TUG): 19.40 sec w/ cane 2 minute walk test: 190 ft  Berg Balance Scale: 35/56 1.58 ft/sec walking speed  PATIENT SURVEYS:    TODAY'S TREATMENT:                                                                                                                              DATE:     PATIENT EDUCATION: Education details: assessment details Person educated: Patient Education method: Explanation Education comprehension: verbalized understanding  HOME EXERCISE PROGRAM: TBD  GOALS: Goals reviewed with patient? Yes  SHORT TERM GOALS: Target date: 02/16/2022    Patient will be independent in HEP to improve functional outcomes Baseline: Goal status: INITIAL  2.  Demo improved balance and LE strength as evidenced by time of 15 sec 5xSTS Baseline: 23.66 w/ right LOB Goal status: INITIAL    LONG TERM GOALS:  Target date: 03/16/2022    Patient will be independent in Advanced HEP to improve functional outcomes and report community resources available for those living with MS to improve self-efficacy and carryover Baseline:  Goal status: INITIAL  2.  Demo improved balance and reduced risk for falls per  score 45/56 Berg Balance Test Baseline: 35/56 Goal status: INITIAL  3.  Improve efficiency and speed of ambulation by traversing 250 ft during Baseline: 190 ft with cane and AFO Goal status: INITIAL  ASSESSMENT:  CLINICAL IMPRESSION: Patient is a 31 y.o. man who was seen today for physical therapy evaluation and treatment for gait and mobility deficits related to MS and seizure activity. Exhibits high risk for falls per balance and gait assessments and demonstrates exacerbation of gait abnormalities and deviations resulting in reduced ambulation speed and increased exertion during ADL and mobility.  PT services indicated to provide relevant interventions and improve patient safety and independence in functional mobility.    OBJECTIVE IMPAIRMENTS: Abnormal gait, decreased activity tolerance, decreased balance, decreased coordination, decreased endurance, decreased mobility, difficulty walking, decreased strength, impaired tone, and improper body mechanics.   ACTIVITY LIMITATIONS: carrying, lifting, standing, stairs, and locomotion level  PARTICIPATION LIMITATIONS: meal prep, cleaning, laundry, shopping, and community activity  PERSONAL FACTORS: Age, Time since onset of injury/illness/exacerbation, and 1 comorbidity: MS/seizure  are also affecting patient's functional outcome.   REHAB POTENTIAL: Good  CLINICAL DECISION MAKING: Evolving/moderate complexity  EVALUATION COMPLEXITY: Moderate  PLAN:  PT FREQUENCY: 1x/week  PT DURATION: 8 weeks  PLANNED INTERVENTIONS: Therapeutic exercises, Therapeutic activity, Neuromuscular re-education, Balance training, Gait training, Patient/Family education, Self Care, Joint mobilization, Stair training, Vestibular training, Canalith repositioning, Orthotic/Fit training, DME instructions, Aquatic Therapy, Dry Needling, Electrical stimulation, Wheelchair mobility training, Spinal mobilization, Cryotherapy, Moist heat, Taping, and Manual  therapy  PLAN FOR NEXT SESSION: corner balance   11:18 AM, 01/19/22 M. Shary Decamp, PT, DPT Physical Therapist- [place] Office Number: [phone]

## 2022-01-26 ENCOUNTER — Ambulatory Visit
Admission: RE | Admit: 2022-01-26 | Discharge: 2022-01-26 | Disposition: A | Discharge status: Home / Self Care | Payer: Medicare (Managed Care) | Source: Ambulatory Visit | Attending: Neurology | Admitting: Neurology

## 2022-01-26 DIAGNOSIS — G40909 Epilepsy, unspecified, not intractable, without status epilepticus: Secondary | ICD-10-CM

## 2022-01-26 DIAGNOSIS — R269 Unspecified abnormalities of gait and mobility: Secondary | ICD-10-CM

## 2022-01-26 DIAGNOSIS — G35 Multiple sclerosis: Secondary | ICD-10-CM

## 2022-01-26 MED ORDER — GADOPICLENOL 0.5 MMOL/ML IV SOLN
7.5000 mL | Freq: Once | INTRAVENOUS | Status: AC | PRN
  Administered 2022-01-26: 7.5 mL via INTRAVENOUS

## 2022-04-25 ENCOUNTER — Emergency Department (HOSPITAL_COMMUNITY): Payer: Medicare (Managed Care)

## 2022-04-25 ENCOUNTER — Inpatient Hospital Stay (HOSPITAL_COMMUNITY)
Admission: EM | Admit: 2022-04-25 | Discharge: 2022-04-28 | DRG: 100 | Disposition: A | Discharge status: Home / Self Care | Payer: Medicare (Managed Care) | Attending: Internal Medicine | Admitting: Internal Medicine

## 2022-04-25 DIAGNOSIS — G40909 Epilepsy, unspecified, not intractable, without status epilepticus: Principal | ICD-10-CM

## 2022-04-25 DIAGNOSIS — Z833 Family history of diabetes mellitus: Secondary | ICD-10-CM

## 2022-04-25 DIAGNOSIS — Z1152 Encounter for screening for COVID-19: Secondary | ICD-10-CM

## 2022-04-25 DIAGNOSIS — R4182 Altered mental status, unspecified: Principal | ICD-10-CM

## 2022-04-25 DIAGNOSIS — G9340 Encephalopathy, unspecified: Secondary | ICD-10-CM | POA: Diagnosis not present

## 2022-04-25 DIAGNOSIS — G35 Multiple sclerosis: Secondary | ICD-10-CM | POA: Diagnosis present

## 2022-04-25 DIAGNOSIS — R5381 Other malaise: Secondary | ICD-10-CM | POA: Diagnosis present

## 2022-04-25 DIAGNOSIS — R569 Unspecified convulsions: Secondary | ICD-10-CM | POA: Diagnosis not present

## 2022-04-25 DIAGNOSIS — Z79899 Other long term (current) drug therapy: Secondary | ICD-10-CM

## 2022-04-25 DIAGNOSIS — Z807 Family history of other malignant neoplasms of lymphoid, hematopoietic and related tissues: Secondary | ICD-10-CM

## 2022-04-25 DIAGNOSIS — Z79620 Long term (current) use of immunosuppressive biologic: Secondary | ICD-10-CM

## 2022-04-25 DIAGNOSIS — F17290 Nicotine dependence, other tobacco product, uncomplicated: Secondary | ICD-10-CM | POA: Diagnosis present

## 2022-04-25 DIAGNOSIS — Z8249 Family history of ischemic heart disease and other diseases of the circulatory system: Secondary | ICD-10-CM

## 2022-04-25 DIAGNOSIS — G9341 Metabolic encephalopathy: Secondary | ICD-10-CM | POA: Diagnosis present

## 2022-04-25 DIAGNOSIS — J690 Pneumonitis due to inhalation of food and vomit: Secondary | ICD-10-CM | POA: Diagnosis present

## 2022-04-25 DIAGNOSIS — Z800 Family history of malignant neoplasm of digestive organs: Secondary | ICD-10-CM

## 2022-04-25 DIAGNOSIS — G35A Relapsing-remitting multiple sclerosis: Secondary | ICD-10-CM | POA: Diagnosis present

## 2022-04-25 DIAGNOSIS — Z888 Allergy status to other drugs, medicaments and biological substances status: Secondary | ICD-10-CM

## 2022-04-25 DIAGNOSIS — R29818 Other symptoms and signs involving the nervous system: Secondary | ICD-10-CM | POA: Diagnosis present

## 2022-04-25 LAB — CBC WITH DIFFERENTIAL/PLATELET
Abs Immature Granulocytes: 0.09 10*3/uL — ABNORMAL HIGH (ref 0.00–0.07)
Basophils Absolute: 0 10*3/uL (ref 0.0–0.1)
Basophils Relative: 0 %
Eosinophils Absolute: 0 10*3/uL (ref 0.0–0.5)
Eosinophils Relative: 0 %
HCT: 43.1 % (ref 39.0–52.0)
Hemoglobin: 14.3 g/dL (ref 13.0–17.0)
Immature Granulocytes: 1 %
Lymphocytes Relative: 5 %
Lymphs Abs: 0.9 10*3/uL (ref 0.7–4.0)
MCH: 32.4 pg (ref 26.0–34.0)
MCHC: 33.2 g/dL (ref 30.0–36.0)
MCV: 97.5 fL (ref 80.0–100.0)
Monocytes Absolute: 0.5 10*3/uL (ref 0.1–1.0)
Monocytes Relative: 3 %
Neutro Abs: 16 10*3/uL — ABNORMAL HIGH (ref 1.7–7.7)
Neutrophils Relative %: 91 %
Platelets: 237 10*3/uL (ref 150–400)
RBC: 4.42 MIL/uL (ref 4.22–5.81)
RDW: 13.1 % (ref 11.5–15.5)
WBC: 17.6 10*3/uL — ABNORMAL HIGH (ref 4.0–10.5)
nRBC: 0 % (ref 0.0–0.2)

## 2022-04-25 LAB — COMPREHENSIVE METABOLIC PANEL
ALT: 59 U/L — ABNORMAL HIGH (ref 0–44)
AST: 62 U/L — ABNORMAL HIGH (ref 15–41)
Albumin: 3.9 g/dL (ref 3.5–5.0)
Alkaline Phosphatase: 82 U/L (ref 38–126)
Anion gap: 12 (ref 5–15)
BUN: 5 mg/dL — ABNORMAL LOW (ref 6–20)
CO2: 25 mmol/L (ref 22–32)
Calcium: 8.7 mg/dL — ABNORMAL LOW (ref 8.9–10.3)
Chloride: 100 mmol/L (ref 98–111)
Creatinine, Ser: 0.96 mg/dL (ref 0.61–1.24)
GFR, Estimated: >60 mL/min (ref >60)
Glucose, Bld: 114 mg/dL — ABNORMAL HIGH (ref 70–99)
Potassium: 3.9 mmol/L (ref 3.5–5.1)
Sodium: 137 mmol/L (ref 135–145)
Total Bilirubin: 0.5 mg/dL (ref 0.3–1.2)
Total Protein: 6.7 g/dL (ref 6.5–8.1)

## 2022-04-25 LAB — RESP PANEL BY RT-PCR (RSV, FLU A&B, COVID)  RVPGX2
Influenza A by PCR: NEGATIVE
Influenza B by PCR: NEGATIVE
Resp Syncytial Virus by PCR: NEGATIVE
SARS Coronavirus 2 by RT PCR: NEGATIVE

## 2022-04-25 LAB — CBG MONITORING, ED: Glucose-Capillary: 129 mg/dL — ABNORMAL HIGH (ref 70–99)

## 2022-04-25 MED ORDER — SODIUM CHLORIDE 0.9 % IV SOLN: INTRAVENOUS | Status: DC

## 2022-04-25 MED ORDER — SODIUM CHLORIDE 0.9 % IV SOLN
1.0000 g | Freq: Once | INTRAVENOUS | Status: AC
  Administered 2022-04-25: 1 g via INTRAVENOUS
  Filled 2022-04-25: qty 10

## 2022-04-25 MED ORDER — ACETAMINOPHEN 325 MG PO TABS
650.0000 mg | ORAL_TABLET | Freq: Once | ORAL | Status: DC
  Filled 2022-04-25: qty 2

## 2022-04-25 MED ORDER — ACETAMINOPHEN 650 MG RE SUPP
650.0000 mg | Freq: Once | RECTAL | Status: AC
  Administered 2022-04-25: 650 mg via RECTAL
  Filled 2022-04-25: qty 1

## 2022-04-25 MED ORDER — SODIUM CHLORIDE 0.9 % IV SOLN
500.0000 mg | Freq: Once | INTRAVENOUS | Status: AC
  Administered 2022-04-25: 500 mg via INTRAVENOUS
  Filled 2022-04-25: qty 5

## 2022-04-25 MED ORDER — SODIUM CHLORIDE 0.9 % IV BOLUS
1000.0000 mL | Freq: Once | INTRAVENOUS | Status: AC
  Administered 2022-04-25: 1000 mL via INTRAVENOUS

## 2022-04-25 NOTE — Consult Note (Signed)
NEURO HOSPITALIST CONSULT NOTE   Requestig physician: Dr. Regenia Skeeter  Reason for Consult:***  History obtained from:  Patient   Chart  Patient and Chart   ***  HPI:                                                                                                                                          Miguel Black is an 32 y.o. male ***  Past Medical History:  Diagnosis Date   MS (multiple sclerosis) ([place])    Seizure ([place])    Status epilepticus ([place]) 07/12/2018    Past Surgical History:  Procedure Laterality Date   NO PAST SURGERIES      Family History  Problem Relation Age of Onset   Hypertension Mother    Healthy Father    Healthy Sister    Healthy Brother    Diabetes Maternal Grandmother    Hypertension Maternal Grandfather    Hodgkin's lymphoma Brother    Colon cancer Maternal Uncle 68            ***  Family History: *** Mother *** Father ***  Social History:  reports that he has been smoking cigars. He has never used smokeless tobacco. He reports current alcohol use. He reports current drug use. Drug: Marijuana.  Allergies  Allergen Reactions   Dalfampridine Other (See Comments)    Seizure 02/2021 while on dalfampridine    MEDICATIONS:                                                                                                                     {medication reviewed/display:3041432}   ROS:  History obtained from {source of history:310783}  General ROS: negative for - chills, fatigue, fever, night sweats, weight gain or weight loss Psychological ROS: negative for - behavioral disorder, hallucinations, memory difficulties, mood swings or suicidal ideation Ophthalmic ROS: negative for - blurry vision, double vision, eye pain or loss of vision ENT ROS: negative for - epistaxis, nasal discharge, oral  lesions, sore throat, tinnitus or vertigo Allergy and Immunology ROS: negative for - hives or itchy/watery eyes Hematological and Lymphatic ROS: negative for - bleeding problems, bruising or swollen lymph nodes Endocrine ROS: negative for - galactorrhea, hair pattern changes, polydipsia/polyuria or temperature intolerance Respiratory ROS: negative for - cough, hemoptysis, shortness of breath or wheezing Cardiovascular ROS: negative for - chest pain, dyspnea on exertion, edema or irregular heartbeat Gastrointestinal ROS: negative for - abdominal pain, diarrhea, hematemesis, nausea/vomiting or stool incontinence Genito-Urinary ROS: negative for - dysuria, hematuria, incontinence or urinary frequency/urgency Musculoskeletal ROS: negative for - joint swelling or muscular weakness Neurological ROS: as noted in HPI Dermatological ROS: negative for rash and skin lesion changes   Blood pressure 128/81, pulse (!) 105, temperature (!) 100.4 F (38 C), temperature source Oral, resp. rate 20, SpO2 100 %.   General Examination:                                                                                                       Physical Exam  HEENT-  Normocephalic, no lesions, without obvious abnormality.  Normal external eye and conjunctiva.   Cardiovascular- S1-S2 audible, pulses palpable throughout   Lungs-no rhonchi or wheezing noted, no excessive working breathing.  Saturations within normal limits Abdomen- All 4 quadrants palpated and nontender Extremities- Warm, dry and intact Musculoskeletal-no joint tenderness, deformity or swelling Skin-warm and dry, no hyperpigmentation, vitiligo, or suspicious lesions  Neurological Examination Mental Status: Alert, oriented, thought content appropriate.  Speech fluent without evidence of aphasia.  Able to follow 3 step commands without difficulty. Cranial Nerves: II: Discs flat bilaterally; Visual fields grossly normal,  III,IV, VI: ptosis not  present, extra-ocular motions intact bilaterally pupils equal, round, reactive to light and accommodation V,VII: smile symmetric, facial light touch sensation normal bilaterally VIII: hearing normal bilaterally IX,X: uvula rises symmetrically XI: bilateral shoulder shrug XII: midline tongue extension Motor: Right : Upper extremity   5/5    Left:     Upper extremity   5/5  Lower extremity   5/5     Lower extremity   5/5 Tone and bulk:normal tone throughout; no atrophy noted Sensory: Pinprick and light touch intact throughout, bilaterally Deep Tendon Reflexes: 2+ and symmetric throughout Plantars: Right: downgoing   Left: downgoing Cerebellar: normal finger-to-nose, normal rapid alternating movements and normal heel-to-shin test Gait: normal gait and station   Lab Results: Basic Metabolic Panel: Recent Labs  Lab 04/25/22 1940  NA 137  K 3.9  CL 100  CO2 25  GLUCOSE 114*  BUN 5*  CREATININE 0.96  CALCIUM 8.7*    CBC: Recent Labs  Lab 04/25/22 1940  WBC 17.6*  NEUTROABS 16.0*  HGB 14.3  HCT 43.1  MCV 97.5  PLT 237    Cardiac Enzymes: No results for input(s): "CKTOTAL", "CKMB", "CKMBINDEX", "TROPONINI" in the last 168 hours.  Lipid Panel: No results for input(s): "CHOL", "TRIG", "HDL", "CHOLHDL", "VLDL", "LDLCALC" in the last 168 hours.  Imaging: CT Head Wo Contrast  Result Date: 04/25/2022 CLINICAL DATA:  Seizure EXAM: CT HEAD WITHOUT CONTRAST TECHNIQUE: Contiguous axial images were obtained from the base of the skull through the vertex without intravenous contrast. RADIATION DOSE REDUCTION: This exam was performed according to the departmental dose-optimization program which includes automated exposure control, adjustment of the mA and/or kV according to patient size and/or use of iterative reconstruction technique. COMPARISON:  MRI 01/26/2022, CT brain 11/14/2021 FINDINGS: Brain: No acute territorial infarction, hemorrhage or intracranial mass. Mild motion  degradation. White matter hypodensity asymmetric to the left without significant change. Nonenlarged ventricles Vascular: No hyperdense vessels, no unexpected calcification Skull: Normal. Negative for fracture or focal lesion. Sinuses/Orbits: No acute finding. Other: None IMPRESSION: 1. Motion degraded exam without definite CT evidence for acute intracranial abnormality. 2. White matter disease without significant change Electronically Signed   By: Donavan Foil M.D.   On: 04/25/2022 21:22   DG Chest Portable 1 View  Result Date: 04/25/2022 CLINICAL DATA:  Altered mental status. EXAM: PORTABLE CHEST 1 VIEW COMPARISON:  Chest radiograph dated 11/14/2021. FINDINGS: Faint density in the left lower lung field, likely artifactual or atelectasis. Developing infiltrate is less likely. No consolidative changes. There is no pleural effusion pneumothorax. The cardiac silhouette is within normal limits. No acute osseous pathology. IMPRESSION: Faint density in the left lower lung field, likely artifactual or atelectasis. No consolidative changes. Electronically Signed   By: Anner Crete M.D.   On: 04/25/2022 19:50     Assessment: -  Recommendations: - MRI brain without contrast (discussed with Dr. Regenia Skeeter) -   Electronically signed: Dr. Kerney Elbe  04/25/2022, 10:09 PM

## 2022-04-25 NOTE — ED Provider Notes (Signed)
Miguel Black   CSN: [id number] Arrival date & time: 04/25/22  1836     History  Chief Complaint  Patient presents with   Seizures    Miguel Black is a 32 y.o. male.  Patient presents to the emergency department via EMS with concerns of a possible seizure.  Patient's family states that he was last seen normal at approximately 4 AM.  They stated at 11 AM they went to check on him and he was lying in bed, not talking.  He was awake at that time.  He was unable to walk or follow commands.  At baseline the patient is reportedly alert and oriented x 4 and ambulatory.  The patient is unable to participate in review of systems at this time.  Upon initial arrival patient would answer questions and incomprehensible sounds but would make eye contact.  Upon further assessment patient was able to follow some simple commands such as grip strength evaluation but was unable to use the right hand.  Patient was febrile upon arrival with a temperature of 100.4 F.  Vital signs were otherwise unremarkable.  Past medical history is significant for seizures, status epilepticus, and mass.  Patient takes Keppra, 1000 mg twice daily.  Patient's mother states he is very meticulous about his medication and never misses doses.  Patient also takes ocrevus and zonisamide.   HPI     Home Medications Prior to Admission medications   Medication Sig Start Date End Date Taking? Authorizing Provider  cholecalciferol (VITAMIN D3) 25 MCG (1000 UNIT) tablet Take 5,000 Units by mouth daily.   Yes [provider]  levETIRAcetam (KEPPRA) 1000 MG tablet Take 1 tablet (1,000 mg total) by mouth 2 (two) times daily. 01/05/22  Yes Sater, Pearletha Furl, MD  OCREVUS 300 MG/10ML injection INFUSE 600MG  IN 0.9% NS INTRAVENOUSLY AT 40ML\HR AND INCREASE BY 40ML\HR EVERY 30 MINUTES (MAX 200ML\HR) OVER AT LEAST 3.5HRS AS DIRECTED EVERY 6 MONTHS. 06/07/21  Yes Sater, Pearletha Furl, MD  zonisamide (ZONEGRAN) 100 MG capsule Take 1 capsule (100 mg total) by mouth daily. 01/05/22  Yes Sater, Pearletha Furl, MD      Allergies    Dalfampridine    Review of Systems   Review of Systems  Reason unable to perform ROS: Patient with AMS.    Physical Exam Updated Vital Signs BP 128/79 (BP Location: Right Arm)   Pulse 75   Temp 99.9 F (37.7 C) (Oral)   Resp (!) 25   SpO2 95%  Physical Exam Vitals and nursing Black reviewed.  Constitutional:      General: He is not in acute distress.    Appearance: He is well-developed.  HENT:     Head: Normocephalic and atraumatic.     Mouth/Throat:     Mouth: Mucous membranes are moist.  Eyes:     Extraocular Movements: Extraocular movements intact.     Conjunctiva/sclera: Conjunctivae normal.     Pupils: Pupils are equal, round, and reactive to light.  Cardiovascular:     Rate and Rhythm: Normal rate and regular rhythm.     Heart sounds: No murmur heard. Pulmonary:     Effort: Pulmonary effort is normal. No respiratory distress.     Breath sounds: Normal breath sounds.  Abdominal:     Palpations: Abdomen is soft.     Tenderness: There is no abdominal tenderness.  Musculoskeletal:        General: No swelling.  Cervical back: Neck supple.  Skin:    General: Skin is warm and dry.     Capillary Refill: Capillary refill takes less than 2 seconds.  Neurological:     Mental Status: He is alert.     Comments: Patient with normal left sided grip strength, unable to grip with right hand. Patient unable to answer questions.   Psychiatric:        Mood and Affect: Mood normal.     ED Results / Procedures / Treatments   Labs (all labs ordered are listed, but only abnormal results are displayed) Labs Reviewed  COMPREHENSIVE METABOLIC PANEL - Abnormal; Notable for the following components:      Result Value   Glucose, Bld 114 (*)    BUN 5 (*)    Calcium 8.7 (*)    AST 62 (*)    ALT 59 (*)    All other components within  normal limits  CBC WITH DIFFERENTIAL/PLATELET - Abnormal; Notable for the following components:   WBC 17.6 (*)    Neutro Abs 16.0 (*)    Abs Immature Granulocytes 0.09 (*)    All other components within normal limits  RAPID URINE DRUG SCREEN, HOSP PERFORMED - Abnormal; Notable for the following components:   Tetrahydrocannabinol POSITIVE (*)    All other components within normal limits  URINALYSIS, W/ REFLEX TO CULTURE (INFECTION SUSPECTED) - Abnormal; Notable for the following components:   APPearance HAZY (*)    Protein, ur 30 (*)    All other components within normal limits  CBC - Abnormal; Notable for the following components:   WBC 15.3 (*)    RBC 3.85 (*)    Hemoglobin 12.3 (*)    HCT 37.8 (*)    All other components within normal limits  COMPREHENSIVE METABOLIC PANEL - Abnormal; Notable for the following components:   Glucose, Bld 107 (*)    Calcium 8.2 (*)    Total Protein 5.7 (*)    Albumin 3.2 (*)    AST 50 (*)    ALT 56 (*)    All other components within normal limits  CBG MONITORING, ED - Abnormal; Notable for the following components:   Glucose-Capillary 129 (*)    All other components within normal limits  RESP PANEL BY RT-PCR (RSV, FLU A&B, COVID)  RVPGX2  HIV ANTIBODY (ROUTINE TESTING W REFLEX)  LEVETIRACETAM LEVEL    EKG EKG Interpretation  Date/Time:  Monday April 25 2022 18:37:33 EDT Ventricular Rate:  78 PR Interval:  118 QRS Duration: 90 QT Interval:  390 QTC Calculation: 445 R Axis:   67 Text Interpretation: Sinus rhythm Borderline short PR interval ST elev, probable normal early repol pattern Confirmed by Pricilla Loveless [phone]) on 04/25/2022 7:30:05 PM  Radiology EEG adult  Result Date: 04/26/2022 Charlsie Quest, MD     04/26/2022  9:10 AM Patient Name: Miguel Black MRN: [id number] Epilepsy Attending: Charlsie Quest Referring Physician/Provider: Caryl Pina, MD Date: 04/26/2022 Duration: 24.30 mins Patient history: 32 y.o. male with  medical history significant of MS, seizures, debility. EEG to evaluate for seizure Level of alertness: Awake AEDs during EEG study: LEV, Ativan Technical aspects: This EEG study was done with scalp electrodes positioned according to the 10-20 International system of electrode placement. Electrical activity was reviewed with band pass filter of 1-70Hz , sensitivity of 7 uV/mm, display speed of 69mm/sec with a  notched filter applied as appropriate. EEG data were recorded continuously and digitally stored.  Video monitoring was  available and reviewed as appropriate. Description: EEG showed continuous generalized 3 to 6 Hz theta-delta slowing admixed with 15 to 18 Hz beta activity  distributed symmetrically and diffusely. Hyperventilation and photic stimulation were not performed.    ABNORMALITY - Continuous slow, generalized - Excessive beta, generalized  IMPRESSION: This study is suggestive of severe diffuse encephalopathy, nonspecific etiology but likely related to sedation. No seizures or epileptiform discharges were seen throughout the recording.  Charlsie Quest   CT Head Wo Contrast  Result Date: 04/25/2022 CLINICAL DATA:  Seizure EXAM: CT HEAD WITHOUT CONTRAST TECHNIQUE: Contiguous axial images were obtained from the base of the skull through the vertex without intravenous contrast. RADIATION DOSE REDUCTION: This exam was performed according to the departmental dose-optimization program which includes automated exposure control, adjustment of the mA and/or kV according to patient size and/or use of iterative reconstruction technique. COMPARISON:  MRI 01/26/2022, CT brain 11/14/2021 FINDINGS: Brain: No acute territorial infarction, hemorrhage or intracranial mass. Mild motion degradation. White matter hypodensity asymmetric to the left without significant change. Nonenlarged ventricles Vascular: No hyperdense vessels, no unexpected calcification Skull: Normal. Negative for fracture or focal lesion.  Sinuses/Orbits: No acute finding. Other: None IMPRESSION: 1. Motion degraded exam without definite CT evidence for acute intracranial abnormality. 2. White matter disease without significant change Electronically Signed   By: Jasmine Pang M.D.   On: 04/25/2022 21:22   DG Chest Portable 1 View  Result Date: 04/25/2022 CLINICAL DATA:  Altered mental status. EXAM: PORTABLE CHEST 1 VIEW COMPARISON:  Chest radiograph dated 11/14/2021. FINDINGS: Faint density in the left lower lung field, likely artifactual or atelectasis. Developing infiltrate is less likely. No consolidative changes. There is no pleural effusion pneumothorax. The cardiac silhouette is within normal limits. No acute osseous pathology. IMPRESSION: Faint density in the left lower lung field, likely artifactual or atelectasis. No consolidative changes. Electronically Signed   By: Elgie Collard M.D.   On: 04/25/2022 19:50    Procedures Procedures    Medications Ordered in ED Medications  sodium chloride 0.9 % bolus 1,000 mL (0 mLs Intravenous Stopped 04/25/22 2301)    And  0.9 %  sodium chloride infusion (0 mLs Intravenous Stopped 04/26/22 0521)  acetaminophen (TYLENOL) tablet 650 mg (650 mg Oral Not Given 04/25/22 1949)  levETIRAcetam (KEPPRA) IVPB 1000 mg/100 mL premix (0 mg Intravenous Stopped 04/26/22 0935)  enoxaparin (LOVENOX) injection 40 mg (40 mg Subcutaneous Given 04/26/22 1425)  lactated ringers infusion ( Intravenous Infusion Verify 04/26/22 0907)  cefTRIAXone (ROCEPHIN) 2 g in sodium chloride 0.9 % 100 mL IVPB (has no administration in time range)  azithromycin (ZITHROMAX) 500 mg in sodium chloride 0.9 % 250 mL IVPB (has no administration in time range)  acetaminophen (TYLENOL) tablet 650 mg (has no administration in time range)  acetaminophen (TYLENOL) suppository 650 mg (650 mg Rectal Given 04/25/22 1954)  cefTRIAXone (ROCEPHIN) 1 g in sodium chloride 0.9 % 100 mL IVPB (0 g Intravenous Stopped 04/25/22 2158)  azithromycin  (ZITHROMAX) 500 mg in sodium chloride 0.9 % 250 mL IVPB (0 mg Intravenous Stopped 04/25/22 2315)  LORazepam (ATIVAN) injection 1 mg (1 mg Intravenous Given 04/26/22 0103)  LORazepam (ATIVAN) injection 0.5 mg (0 mg Intravenous Hold 04/26/22 0402)    ED Course/ Medical Decision Making/ A&P                             Medical Decision Making Amount and/or Complexity of Data Reviewed Labs: ordered.  Radiology: ordered.  Risk OTC drugs. Prescription drug management. Decision regarding hospitalization.   This patient presents to the ED for concern of altered mental status, this involves an extensive number of treatment options, and is a complaint that carries with it a high risk of complications and morbidity.  The differential diagnosis includes seizure/postictal state, intracranial abnormality such as CVA, MS flare, metabolic encephalopathy, others   Co morbidities that complicate the patient evaluation  History of seizures, MS   Additional history obtained:  Additional history obtained from patient's mother External records from outside source obtained and reviewed including Neurology notes from December 13 with Dr.Sater.  Patient was evaluated at that time for seizures and MS.  Patient was continuing on his MS medications.  Patient continued with Keppra 1000 mg twice daily.   Lab Tests:  I Ordered, and personally interpreted labs.  The pertinent results include: WBC 17.6, mildly elevated AST, ALT negative respiratory panel.   Imaging Studies ordered:  I ordered imaging studies including chest x-ray, CT head, MR brain with and without contrast I independently visualized and interpreted imaging which showed  Faint density in the left lower lung field, likely artifactual or  atelectasis. No consolidative changes.  No obvious disease or abnormality on CT head. MR brain pending I agree with the radiologist interpretation   Cardiac Monitoring: / EKG:  The patient was maintained  on a cardiac monitor.  I personally viewed and interpreted the cardiac monitored which showed an underlying rhythm of: Sinus rhythm   Consultations Obtained:  I requested consultation with the neurologist, Dr.Lindzen  and discussed lab and imaging findings as well as pertinent plan - they recommend: MR brain, medicine admit.  I requested consultation with the hospitalist   Problem List / ED Course / Critical interventions / Medication management   I ordered medication including Tylenol for fever, Rocephin and azithromycin for possible pneumonia Reevaluation of the patient after these medicines showed that the patient stayed the same I have reviewed the patients home medicines and have made adjustments as needed   Social Determinants of Health:  Patient lives at home with his family   Test / Admission - Considered:  Patient continues to be altered.  Unclear etiology at this time.  Plan for admission to medicine service for further evaluation and management with neurology as consultant        Final Clinical Impression(s) / ED Diagnoses Final diagnoses:  Altered mental status, unspecified altered mental status type    Rx / DC Orders ED Discharge Orders     None         Pamala DuffelMcCauley, Luvena Wentling B, PA-C 04/26/22 1613    Pricilla LovelessGoldston, Scott, MD 04/29/22 1409

## 2022-04-25 NOTE — ED Triage Notes (Signed)
Patient BIB EMS from home from seizure activity, Unknown what time seizure happen. Patient currently in post ictal.VSS. H/O seizures.

## 2022-04-25 NOTE — ED Notes (Signed)
Bed alarm placed on patient

## 2022-04-26 ENCOUNTER — Observation Stay (HOSPITAL_COMMUNITY): Payer: Medicare (Managed Care)

## 2022-04-26 ENCOUNTER — Other Ambulatory Visit: Payer: Self-pay

## 2022-04-26 DIAGNOSIS — G35 Multiple sclerosis: Secondary | ICD-10-CM

## 2022-04-26 DIAGNOSIS — Z79899 Other long term (current) drug therapy: Secondary | ICD-10-CM | POA: Diagnosis not present

## 2022-04-26 DIAGNOSIS — Z888 Allergy status to other drugs, medicaments and biological substances status: Secondary | ICD-10-CM | POA: Diagnosis not present

## 2022-04-26 DIAGNOSIS — Z833 Family history of diabetes mellitus: Secondary | ICD-10-CM | POA: Diagnosis not present

## 2022-04-26 DIAGNOSIS — G40909 Epilepsy, unspecified, not intractable, without status epilepticus: Secondary | ICD-10-CM | POA: Diagnosis present

## 2022-04-26 DIAGNOSIS — G9341 Metabolic encephalopathy: Secondary | ICD-10-CM | POA: Diagnosis not present

## 2022-04-26 DIAGNOSIS — G9340 Encephalopathy, unspecified: Secondary | ICD-10-CM | POA: Diagnosis present

## 2022-04-26 DIAGNOSIS — Z800 Family history of malignant neoplasm of digestive organs: Secondary | ICD-10-CM | POA: Diagnosis not present

## 2022-04-26 DIAGNOSIS — Z1152 Encounter for screening for COVID-19: Secondary | ICD-10-CM | POA: Diagnosis not present

## 2022-04-26 DIAGNOSIS — R29818 Other symptoms and signs involving the nervous system: Secondary | ICD-10-CM | POA: Diagnosis not present

## 2022-04-26 DIAGNOSIS — Z807 Family history of other malignant neoplasms of lymphoid, hematopoietic and related tissues: Secondary | ICD-10-CM | POA: Diagnosis not present

## 2022-04-26 DIAGNOSIS — J690 Pneumonitis due to inhalation of food and vomit: Secondary | ICD-10-CM

## 2022-04-26 DIAGNOSIS — Z79620 Long term (current) use of immunosuppressive biologic: Secondary | ICD-10-CM | POA: Diagnosis not present

## 2022-04-26 DIAGNOSIS — R5381 Other malaise: Secondary | ICD-10-CM | POA: Diagnosis present

## 2022-04-26 DIAGNOSIS — Z8249 Family history of ischemic heart disease and other diseases of the circulatory system: Secondary | ICD-10-CM | POA: Diagnosis not present

## 2022-04-26 DIAGNOSIS — R4182 Altered mental status, unspecified: Secondary | ICD-10-CM | POA: Diagnosis present

## 2022-04-26 DIAGNOSIS — F17290 Nicotine dependence, other tobacco product, uncomplicated: Secondary | ICD-10-CM | POA: Diagnosis present

## 2022-04-26 HISTORY — DX: Metabolic encephalopathy: G93.41

## 2022-04-26 HISTORY — DX: Encephalopathy, unspecified: G93.40

## 2022-04-26 LAB — CBC
HCT: 37.8 % — ABNORMAL LOW (ref 39.0–52.0)
Hemoglobin: 12.3 g/dL — ABNORMAL LOW (ref 13.0–17.0)
MCH: 31.9 pg (ref 26.0–34.0)
MCHC: 32.5 g/dL (ref 30.0–36.0)
MCV: 98.2 fL (ref 80.0–100.0)
Platelets: 220 10*3/uL (ref 150–400)
RBC: 3.85 MIL/uL — ABNORMAL LOW (ref 4.22–5.81)
RDW: 13.2 % (ref 11.5–15.5)
WBC: 15.3 10*3/uL — ABNORMAL HIGH (ref 4.0–10.5)
nRBC: 0 % (ref 0.0–0.2)

## 2022-04-26 LAB — COMPREHENSIVE METABOLIC PANEL
ALT: 56 U/L — ABNORMAL HIGH (ref 0–44)
AST: 50 U/L — ABNORMAL HIGH (ref 15–41)
Albumin: 3.2 g/dL — ABNORMAL LOW (ref 3.5–5.0)
Alkaline Phosphatase: 65 U/L (ref 38–126)
Anion gap: 8 (ref 5–15)
BUN: 6 mg/dL (ref 6–20)
CO2: 25 mmol/L (ref 22–32)
Calcium: 8.2 mg/dL — ABNORMAL LOW (ref 8.9–10.3)
Chloride: 104 mmol/L (ref 98–111)
Creatinine, Ser: 0.87 mg/dL (ref 0.61–1.24)
GFR, Estimated: >60 mL/min (ref >60)
Glucose, Bld: 107 mg/dL — ABNORMAL HIGH (ref 70–99)
Potassium: 3.5 mmol/L (ref 3.5–5.1)
Sodium: 137 mmol/L (ref 135–145)
Total Bilirubin: 0.4 mg/dL (ref 0.3–1.2)
Total Protein: 5.7 g/dL — ABNORMAL LOW (ref 6.5–8.1)

## 2022-04-26 LAB — URINALYSIS, W/ REFLEX TO CULTURE (INFECTION SUSPECTED)
Bacteria, UA: NONE SEEN
Bilirubin Urine: NEGATIVE
Glucose, UA: NEGATIVE mg/dL
Hgb urine dipstick: NEGATIVE
Ketones, ur: NEGATIVE mg/dL
Leukocytes,Ua: NEGATIVE
Nitrite: NEGATIVE
Protein, ur: 30 mg/dL — AB
Specific Gravity, Urine: 1.027 (ref 1.005–1.030)
pH: 7 (ref 5.0–8.0)

## 2022-04-26 LAB — RAPID URINE DRUG SCREEN, HOSP PERFORMED
Amphetamines: NOT DETECTED
Barbiturates: NOT DETECTED
Benzodiazepines: NOT DETECTED
Cocaine: NOT DETECTED
Opiates: NOT DETECTED
Tetrahydrocannabinol: POSITIVE — AB

## 2022-04-26 LAB — HIV ANTIBODY (ROUTINE TESTING W REFLEX): HIV Screen 4th Generation wRfx: NONREACTIVE

## 2022-04-26 MED ORDER — ZONISAMIDE 100 MG PO CAPS
100.0000 mg | ORAL_CAPSULE | Freq: Every day | ORAL | Status: DC
  Administered 2022-04-26: 100 mg via ORAL
  Filled 2022-04-26: qty 1

## 2022-04-26 MED ORDER — LACTATED RINGERS IV SOLN: INTRAVENOUS | Status: DC

## 2022-04-26 MED ORDER — ZONISAMIDE 100 MG PO CAPS
200.0000 mg | ORAL_CAPSULE | Freq: Every day | ORAL | Status: DC
  Administered 2022-04-27 – 2022-04-28 (×2): 200 mg via ORAL
  Filled 2022-04-26 (×2): qty 2

## 2022-04-26 MED ORDER — LORAZEPAM 2 MG/ML IJ SOLN
0.5000 mg | Freq: Once | INTRAMUSCULAR | Status: AC
  Administered 2022-04-26: 0.5 mg via INTRAVENOUS

## 2022-04-26 MED ORDER — LORAZEPAM 2 MG/ML IJ SOLN
1.0000 mg | Freq: Once | INTRAMUSCULAR | Status: AC
  Administered 2022-04-26: 1 mg via INTRAVENOUS
  Filled 2022-04-26: qty 1

## 2022-04-26 MED ORDER — ENOXAPARIN SODIUM 40 MG/0.4ML IJ SOSY
40.0000 mg | PREFILLED_SYRINGE | INTRAMUSCULAR | Status: DC
  Administered 2022-04-26 – 2022-04-27 (×2): 40 mg via SUBCUTANEOUS
  Filled 2022-04-26 (×2): qty 0.4

## 2022-04-26 MED ORDER — LORAZEPAM 2 MG/ML IJ SOLN
INTRAMUSCULAR | Status: AC
  Filled 2022-04-26: qty 1

## 2022-04-26 MED ORDER — SODIUM CHLORIDE 0.9 % IV SOLN
500.0000 mg | INTRAVENOUS | Status: DC
  Administered 2022-04-26: 500 mg via INTRAVENOUS
  Filled 2022-04-26: qty 5

## 2022-04-26 MED ORDER — SODIUM CHLORIDE 0.9 % IV SOLN
2.0000 g | INTRAVENOUS | Status: DC
  Administered 2022-04-26: 2 g via INTRAVENOUS
  Filled 2022-04-26: qty 20

## 2022-04-26 MED ORDER — LEVETIRACETAM IN NACL 1000 MG/100ML IV SOLN
1000.0000 mg | Freq: Two times a day (BID) | INTRAVENOUS | Status: DC
  Administered 2022-04-26 – 2022-04-28 (×6): 1000 mg via INTRAVENOUS
  Filled 2022-04-26 (×7): qty 100

## 2022-04-26 MED ORDER — ACETAMINOPHEN 325 MG PO TABS: 650.0000 mg | ORAL_TABLET | Freq: Four times a day (QID) | ORAL | Status: DC | PRN

## 2022-04-26 NOTE — ED Provider Notes (Signed)
Received at shift change from Barbie Banner, PA-C  In short patient with medical history including seizures, MS, presenting with altered mental status, unable to obtain any information from patient, per previous provider last known well was 4 AM Monday, patient was noted to be altered around 11 AM Monday, on arrival patient was altered, would not cooperate with examination or communicate.  Patient has history of seizures as well as MS, has been compliant with medications, never happened in the past.  Per previous provider admit to medicine for further evaluation Physical Exam  BP (!) 140/108   Pulse 70   Temp 98 F (36.7 C)   Resp 20   SpO2 97%   Physical Exam Vitals and nursing note reviewed.  Constitutional:      General: He is not in acute distress.    Appearance: He is not ill-appearing.  HENT:     Head: Normocephalic and atraumatic.     Nose: No congestion.  Eyes:     Extraocular Movements: Extraocular movements intact.     Conjunctiva/sclera: Conjunctivae normal.     Pupils: Pupils are equal, round, and reactive to light.  Cardiovascular:     Rate and Rhythm: Normal rate and regular rhythm.     Pulses: Normal pulses.     Heart sounds: No murmur heard.    No friction rub. No gallop.  Pulmonary:     Effort: No respiratory distress.     Breath sounds: No wheezing, rhonchi or rales.  Abdominal:     Palpations: Abdomen is soft.     Tenderness: There is no abdominal tenderness. There is no right CVA tenderness or left CVA tenderness.  Skin:    General: Skin is warm and dry.  Neurological:     Mental Status: He is alert.     Comments: Patient would not cooperate during my evaluation, but he appears to be favoring his left side, will pull himself up or push himself away using his left arm and left leg, he can move his right arm and left leg but appears he is having difficulty doing so, he does respond to painful stimuli in the upper and lower extremities bilaterally, there is  no noted facial asymmetry, EOMs, PERRLA, patient would not communicate with me.  Psychiatric:        Mood and Affect: Mood normal.     Procedures  Procedures  ED Course / MDM    Medical Decision Making Amount and/or Complexity of Data Reviewed Labs: ordered. Radiology: ordered.  Risk OTC drugs. Prescription drug management. Decision regarding hospitalization.    Lab Tests:  I Ordered, and personally interpreted labs.  The pertinent results include: CBC shows leukocytosis of 17.6, CMP reveals glucose of 114, BUN 5, calcium 8.7, AST 62, ALT 59, respiratory panel negative   Imaging Studies ordered:  I ordered imaging studies including CT head, chest x-ray, MRI brain with and without I independently visualized and interpreted imaging which showed chest x-ray and CT head negative acute findings, MRI pending I agree with the radiologist interpretation   Cardiac Monitoring:  The patient was maintained on a cardiac monitor.  I personally viewed and interpreted the cardiac monitored which showed an underlying rhythm of: EKG without signs of ischemia   Medicines ordered and prescription drug management:  I ordered medication including at Ativan I have reviewed the patients home medicines and have made adjustments as needed  Critical Interventions:  N/a   Reevaluation:  On my evaluation patient would not cooperate, attempted  to obtain MRI but patient would not lay flat, will provide with Ativan.   Consultations Obtained:  I requested consultation with the Dr. Alcario Drought,  and discussed lab and imaging findings as well as pertinent plan - they recommend: He will admit the patient.    Test Considered:  N/A    Rule out low suspicion for internal head bleed and or mass as CT imaging is negative for acute findings. Low suspicion for dissection of the vertebral or carotid artery as presentation atypical of etiology.  Low suspicion for meningitis as has no meningeal  sign, presentation appears to be atypical.   Dispostion and problem list  After consideration of the diagnostic results and the patients response to treatment, I feel that the patent would benefit from admission.  Altered mental status-unclear etiology, patient will need further evaluation with MRI, possible LP, and formal evaluation by neurology.           Marcello Fennel, PA-C 04/26/22 0105    Quintella Reichert, MD 04/26/22 [phone]

## 2022-04-26 NOTE — ED Notes (Signed)
EEG at bedside.

## 2022-04-26 NOTE — Progress Notes (Signed)
Subjective: More awake, feels like he is close to his baseline.  He was unable to get his MRI due to too much movement.  Exam: Vitals:   04/26/22 1106 04/26/22 1538  BP: 138/75 128/79  Pulse: 67 75  Resp: 16 (!) 25  Temp: 100.1 F (37.8 C) 99.9 F (37.7 C)  SpO2: 92% 95%   Gen: In bed, NAD Resp: non-labored breathing, no acute distress Abd: soft, nt  Neuro: MS: He is awake, alert, interactive and appropriate CN: Right exotropia, pupils reactive Motor: He is able to lift all extremities against gravity Sensory: Endorses symmetric sensation   Pertinent Labs: Creatinine  Impression: 32 year old male with history of both MS and seizures who presented with prolonged postictal state following breakthrough seizure at home.  With his improvement, I think we can hold off on MRI for now and reassess in the morning.  Of his two AEDs, I would favor increasing Zonegran in response to this breakthrough.  Recommendations: 1) continue Keppra 1 g twice daily 2) increase Zonegran to 200 mg daily 3) neurology will continue to follow  Roland Rack, MD [facility] [phone]  If 7pm- 7am, please page neurology on call as listed in [place].

## 2022-04-26 NOTE — ED Notes (Signed)
ED TO INPATIENT HANDOFF REPORT  ED Nurse Name and Phone #: [phone]  S Name/Age/Gender Miguel Black 32 y.o. male Room/Bed: 001C/001C  Code Status   Code Status: Full Code  Home/SNF/Other Home Patient oriented to: self, place, time, and situation Is this baseline? Yes   Triage Complete: Triage complete  Chief Complaint Acute encephalopathy [G93.40]  Triage Note Patient BIB EMS from home from seizure activity, Unknown what time seizure happen. Patient currently in post ictal.VSS. H/O seizures.    Allergies Allergies  Allergen Reactions   Dalfampridine Other (See Comments)    Seizure 02/2021 while on dalfampridine    Level of Care/Admitting Diagnosis ED Disposition     ED Disposition  Admit   Condition  --   [place]: [place] [[id number]]  Level of Care: Progressive [102]  Admit to Progressive based on following criteria: ACUTE MENTAL DISORDER-RELATED Drug/Alcohol Ingestion/Overdose/Withdrawal, Suicidal Ideation/attempt requiring safety sitter and < Q2h monitoring/assessments, moderate to severe agitation that is managed with medication/sitter, CIWA-Ar score < 20.  Admit to Progressive based on following criteria: NEUROLOGICAL AND NEUROSURGICAL complex patients with significant risk of instability, who do not meet ICU criteria, yet require close observation or frequent assessment (< / = every 2 - 4 hours) with medical / nursing intervention.  May place patient in observation at [facility] or [facility] if equivalent level of care is available:: No  Covid Evaluation: Symptomatic Person Under Investigation (PUI) or recent exposure (last 10 days) *Testing Required*  Diagnosis: Acute encephalopathy [id number]  Admitting Physician: Etta Quill [id number]  Attending Physician: Etta Quill [phone]          B Medical/Surgery History Past Medical History:  Diagnosis Date   MS (multiple sclerosis) ([place])    Seizure ([place])    Status  epilepticus ([place]) 07/12/2018   Past Surgical History:  Procedure Laterality Date   NO PAST SURGERIES       A IV Location/Drains/Wounds Patient Lines/Drains/Airways Status     Active Line/Drains/Airways     Name Placement date Placement time Site Days   Peripheral IV 04/25/22 20 G Anterior;Left;Proximal Forearm 04/25/22  1829  Forearm  1   Peripheral IV 04/25/22 20 G Anterior;Right Forearm 04/25/22  1941  Forearm  1            Intake/Output Last 24 hours  Intake/Output Summary (Last 24 hours) at 04/26/2022 [id number] Last data filed at 04/26/2022 0907 Gross per 24 hour  Intake 2700.78 ml  Output --  Net 2700.78 ml    Labs/Imaging Results for orders placed or performed during the hospital encounter of 04/25/22 (from the past 48 hour(s))  Comprehensive metabolic panel     Status: Abnormal   Collection Time: 04/25/22  7:40 PM  Result Value Ref Range   Sodium 137 135 - 145 mmol/L   Potassium 3.9 3.5 - 5.1 mmol/L   Chloride 100 98 - 111 mmol/L   CO2 25 22 - 32 mmol/L   Glucose, Bld 114 (H) 70 - 99 mg/dL    Comment: Glucose reference range applies only to samples taken after fasting for at least 8 hours.   BUN 5 (L) 6 - 20 mg/dL   Creatinine, Ser 0.96 0.61 - 1.24 mg/dL   Calcium 8.7 (L) 8.9 - 10.3 mg/dL   Total Protein 6.7 6.5 - 8.1 g/dL   Albumin 3.9 3.5 - 5.0 g/dL   AST 62 (H) 15 - 41 U/L   ALT 59 (H) 0 -  44 U/L   Alkaline Phosphatase 82 38 - 126 U/L   Total Bilirubin 0.5 0.3 - 1.2 mg/dL   GFR, Estimated >60 >60 mL/min    Comment: (NOTE) Calculated using the CKD-EPI Creatinine Equation (2021)    Anion gap 12 5 - 15    Comment: Performed at [facility] [address] [place], [place] [address]  CBC with Differential/Platelet     Status: Abnormal   Collection Time: 04/25/22  7:40 PM  Result Value Ref Range   WBC 17.6 (H) 4.0 - 10.5 K/uL   RBC 4.42 4.22 - 5.81 MIL/uL   Hemoglobin 14.3 13.0 - 17.0 g/dL   HCT 43.1 39.0 - 52.0 %   MCV 97.5 80.0 - 100.0 fL   MCH  32.4 26.0 - 34.0 pg   MCHC 33.2 30.0 - 36.0 g/dL   RDW 13.1 11.5 - 15.5 %   Platelets 237 150 - 400 K/uL   nRBC 0.0 0.0 - 0.2 %   Neutrophils Relative % 91 %   Neutro Abs 16.0 (H) 1.7 - 7.7 K/uL   Lymphocytes Relative 5 %   Lymphs Abs 0.9 0.7 - 4.0 K/uL   Monocytes Relative 3 %   Monocytes Absolute 0.5 0.1 - 1.0 K/uL   Eosinophils Relative 0 %   Eosinophils Absolute 0.0 0.0 - 0.5 K/uL   Basophils Relative 0 %   Basophils Absolute 0.0 0.0 - 0.1 K/uL   Immature Granulocytes 1 %   Abs Immature Granulocytes 0.09 (H) 0.00 - 0.07 K/uL    Comment: Performed at [facility] [address], [place], [place] [address]  CBG monitoring, ED     Status: Abnormal   Collection Time: 04/25/22  7:43 PM  Result Value Ref Range   Glucose-Capillary 129 (H) 70 - 99 mg/dL    Comment: Glucose reference range applies only to samples taken after fasting for at least 8 hours.  Resp panel by RT-PCR (RSV, Flu A&B, Covid) Anterior Nasal Swab     Status: None   Collection Time: 04/25/22  9:36 PM   Specimen: Anterior Nasal Swab  Result Value Ref Range   SARS Coronavirus 2 by RT PCR NEGATIVE NEGATIVE   Influenza A by PCR NEGATIVE NEGATIVE   Influenza B by PCR NEGATIVE NEGATIVE    Comment: (NOTE) The Xpert Xpress SARS-CoV-2/FLU/RSV plus assay is intended as an aid in the diagnosis of influenza from Nasopharyngeal swab specimens and should not be used as a sole basis for treatment. Nasal washings and aspirates are unacceptable for Xpert Xpress SARS-CoV-2/FLU/RSV testing.  Fact Sheet for Patients: EntrepreneurPulse.com.au  Fact Sheet for Healthcare Providers: IncredibleEmployment.be  This test is not yet approved or cleared by the [place] FDA and has been authorized for detection and/or diagnosis of SARS-CoV-2 by FDA under an Emergency Use Authorization (EUA). This EUA will remain in effect (meaning this test can be used) for the duration of the COVID-19  declaration under Section 564(b)(1) of the Act, 21 U.S.C. section 360bbb-3(b)(1), unless the authorization is terminated or revoked.     Resp Syncytial Virus by PCR NEGATIVE NEGATIVE    Comment: (NOTE) Fact Sheet for Patients: EntrepreneurPulse.com.au  Fact Sheet for Healthcare Providers: IncredibleEmployment.be  This test is not yet approved or cleared by the [place] FDA and has been authorized for detection and/or diagnosis of SARS-CoV-2 by FDA under an Emergency Use Authorization (EUA). This EUA will remain in effect (meaning this test can be used) for the duration of  the COVID-19 declaration under Section 564(b)(1) of the Act, 21 U.S.C. section 360bbb-3(b)(1), unless the authorization is terminated or revoked.  Performed at [facility], [place] [address], [place], [place] [address]   Rapid urine drug screen (hospital performed)     Status: Abnormal   Collection Time: 04/26/22  1:50 AM  Result Value Ref Range   Opiates NONE DETECTED NONE DETECTED   Cocaine NONE DETECTED NONE DETECTED   Benzodiazepines NONE DETECTED NONE DETECTED   Amphetamines NONE DETECTED NONE DETECTED   Tetrahydrocannabinol POSITIVE (A) NONE DETECTED   Barbiturates NONE DETECTED NONE DETECTED    Comment: (NOTE) DRUG SCREEN FOR MEDICAL PURPOSES ONLY.  IF CONFIRMATION IS NEEDED FOR ANY PURPOSE, NOTIFY LAB WITHIN 5 DAYS.  LOWEST DETECTABLE LIMITS FOR URINE DRUG SCREEN Drug Class                     Cutoff (ng/mL) Amphetamine and metabolites    1000 Barbiturate and metabolites    200 Benzodiazepine                 200 Opiates and metabolites        300 Cocaine and metabolites        300 THC                            50 Performed at [facility], [place] [address], [place], [place] [address]   Urinalysis, w/ Reflex to Culture (Infection Suspected) -Urine, Clean Catch     Status: Abnormal   Collection Time: 04/26/22  1:50 AM  Result Value Ref Range    Specimen Source URINE, CLEAN CATCH    Color, Urine YELLOW YELLOW   APPearance HAZY (A) CLEAR   Specific Gravity, Urine 1.027 1.005 - 1.030   pH 7.0 5.0 - 8.0   Glucose, UA NEGATIVE NEGATIVE mg/dL   Hgb urine dipstick NEGATIVE NEGATIVE   Bilirubin Urine NEGATIVE NEGATIVE   Ketones, ur NEGATIVE NEGATIVE mg/dL   Protein, ur 30 (A) NEGATIVE mg/dL   Nitrite NEGATIVE NEGATIVE   Leukocytes,Ua NEGATIVE NEGATIVE   RBC / HPF 0-5 0 - 5 RBC/hpf   WBC, UA 0-5 0 - 5 WBC/hpf    Comment:        Reflex urine culture not performed if WBC <=10, OR if Squamous epithelial cells >5. If Squamous epithelial cells >5 suggest recollection.    Bacteria, UA NONE SEEN NONE SEEN   Squamous Epithelial / HPF 0-5 0 - 5 /HPF   Mucus PRESENT     Comment: Performed at [facility], [place] [address] [place], [place] [address]  CBC     Status: Abnormal   Collection Time: 04/26/22  5:20 AM  Result Value Ref Range   WBC 15.3 (H) 4.0 - 10.5 K/uL   RBC 3.85 (L) 4.22 - 5.81 MIL/uL   Hemoglobin 12.3 (L) 13.0 - 17.0 g/dL   HCT 37.8 (L) 39.0 - 52.0 %   MCV 98.2 80.0 - 100.0 fL   MCH 31.9 26.0 - 34.0 pg   MCHC 32.5 30.0 - 36.0 g/dL   RDW 13.2 11.5 - 15.5 %   Platelets 220 150 - 400 K/uL   nRBC 0.0 0.0 - 0.2 %    Comment: Performed at [facility], [place] [address], [place], [place] [address]  Comprehensive metabolic panel     Status: Abnormal   Collection Time: 04/26/22  5:20  AM  Result Value Ref Range   Sodium 137 135 - 145 mmol/L   Potassium 3.5 3.5 - 5.1 mmol/L   Chloride 104 98 - 111 mmol/L   CO2 25 22 - 32 mmol/L   Glucose, Bld 107 (H) 70 - 99 mg/dL    Comment: Glucose reference range applies only to samples taken after fasting for at least 8 hours.   BUN 6 6 - 20 mg/dL   Creatinine, Ser 0.87 0.61 - 1.24 mg/dL   Calcium 8.2 (L) 8.9 - 10.3 mg/dL   Total Protein 5.7 (L) 6.5 - 8.1 g/dL   Albumin 3.2 (L) 3.5 - 5.0 g/dL   AST 50 (H) 15 - 41 U/L   ALT 56 (H) 0 - 44 U/L   Alkaline Phosphatase 65 38  - 126 U/L   Total Bilirubin 0.4 0.3 - 1.2 mg/dL   GFR, Estimated >60 >60 mL/min    Comment: (NOTE) Calculated using the CKD-EPI Creatinine Equation (2021)    Anion gap 8 5 - 15    Comment: Performed at [facility], [place] [address], [place], [place] [address]   EEG adult  Result Date: 04/26/2022 Lora Havens, MD     04/26/2022  9:10 AM Patient Name: Miguel Black MRN: [id number] Epilepsy Attending: Lora Havens Referring Physician/Provider: Kerney Elbe, MD Date: 04/26/2022 Duration: 24.30 mins Patient history: 32 y.o. male with medical history significant of MS, seizures, debility. EEG to evaluate for seizure Level of alertness: Awake AEDs during EEG study: LEV, Ativan Technical aspects: This EEG study was done with scalp electrodes positioned according to the 10-20 International system of electrode placement. Electrical activity was reviewed with band pass filter of 1-70Hz , sensitivity of 7 uV/mm, display speed of 70mm/sec with a 60Hz  notched filter applied as appropriate. EEG data were recorded continuously and digitally stored.  Video monitoring was available and reviewed as appropriate. Description: EEG showed continuous generalized 3 to 6 Hz theta-delta slowing admixed with 15 to 18 Hz beta activity  distributed symmetrically and diffusely. Hyperventilation and photic stimulation were not performed.    ABNORMALITY - Continuous slow, generalized - Excessive beta, generalized  IMPRESSION: This study is suggestive of severe diffuse encephalopathy, nonspecific etiology but likely related to sedation. No seizures or epileptiform discharges were seen throughout the recording.  Lora Havens   CT Head Wo Contrast  Result Date: 04/25/2022 CLINICAL DATA:  Seizure EXAM: CT HEAD WITHOUT CONTRAST TECHNIQUE: Contiguous axial images were obtained from the base of the skull through the vertex without intravenous contrast. RADIATION DOSE REDUCTION: This exam was performed according to the  departmental dose-optimization program which includes automated exposure control, adjustment of the mA and/or kV according to patient size and/or use of iterative reconstruction technique. COMPARISON:  MRI 01/26/2022, CT brain 11/14/2021 FINDINGS: Brain: No acute territorial infarction, hemorrhage or intracranial mass. Mild motion degradation. White matter hypodensity asymmetric to the left without significant change. Nonenlarged ventricles Vascular: No hyperdense vessels, no unexpected calcification Skull: Normal. Negative for fracture or focal lesion. Sinuses/Orbits: No acute finding. Other: None IMPRESSION: 1. Motion degraded exam without definite CT evidence for acute intracranial abnormality. 2. White matter disease without significant change Electronically Signed   By: Donavan Foil M.D.   On: 04/25/2022 21:22   DG Chest Portable 1 View  Result Date: 04/25/2022 CLINICAL DATA:  Altered mental status. EXAM: PORTABLE CHEST 1 VIEW COMPARISON:  Chest radiograph dated 11/14/2021. FINDINGS: Faint density in the left lower lung field, likely artifactual or  atelectasis. Developing infiltrate is less likely. No consolidative changes. There is no pleural effusion pneumothorax. The cardiac silhouette is within normal limits. No acute osseous pathology. IMPRESSION: Faint density in the left lower lung field, likely artifactual or atelectasis. No consolidative changes. Electronically Signed   By: Anner Crete M.D.   On: 04/25/2022 19:50    Pending Labs Unresulted Labs (From admission, onward)     Start     Ordered   04/26/22 0503  HIV Antibody (routine testing w rflx)  (HIV Antibody (Routine testing w reflex) panel)  Once,   R        04/26/22 0504   04/25/22 1915  Keppra (Levetiracetam) level  Once,   URGENT        04/25/22 1915            Vitals/Pain Today's Vitals   04/26/22 0730 04/26/22 0745 04/26/22 0830 04/26/22 0900  BP: 112/75 129/71 133/79 (!) 126/97  Pulse: 71 66 72 88  Resp: 18 15 16   (!) 28  Temp:      TempSrc:      SpO2: 95% 97% 97% 95%    Isolation Precautions No active isolations  Medications Medications  sodium chloride 0.9 % bolus 1,000 mL (0 mLs Intravenous Stopped 04/25/22 2301)    And  0.9 %  sodium chloride infusion (0 mLs Intravenous Stopped 04/26/22 0521)  acetaminophen (TYLENOL) tablet 650 mg (650 mg Oral Not Given 04/25/22 1949)  levETIRAcetam (KEPPRA) IVPB 1000 mg/100 mL premix (0 mg Intravenous Stopped 04/26/22 0935)  enoxaparin (LOVENOX) injection 40 mg (has no administration in time range)  lactated ringers infusion ( Intravenous Infusion Verify 04/26/22 0907)  cefTRIAXone (ROCEPHIN) 2 g in sodium chloride 0.9 % 100 mL IVPB (has no administration in time range)  azithromycin (ZITHROMAX) 500 mg in sodium chloride 0.9 % 250 mL IVPB (has no administration in time range)  acetaminophen (TYLENOL) suppository 650 mg (650 mg Rectal Given 04/25/22 1954)  cefTRIAXone (ROCEPHIN) 1 g in sodium chloride 0.9 % 100 mL IVPB (0 g Intravenous Stopped 04/25/22 2158)  azithromycin (ZITHROMAX) 500 mg in sodium chloride 0.9 % 250 mL IVPB (0 mg Intravenous Stopped 04/25/22 2315)  LORazepam (ATIVAN) injection 1 mg (1 mg Intravenous Given 04/26/22 0103)  LORazepam (ATIVAN) injection 0.5 mg (0 mg Intravenous Hold 04/26/22 0402)    Mobility Has not gotten out of bed for me, gave patient urinal      Focused Assessments Neuro Assessment Handoff:  Swallow screen pass? Have not performed yet  Cardiac Rhythm: Normal sinus rhythm       Neuro Assessment: Exceptions to [facility] Neuro Checks:      Has TPA been given? No If patient is a Neuro Trauma and patient is going to OR before floor call report to [place] nurse: [phone] or [phone]   R Recommendations: See Admitting Provider Note  Report given to:   Additional Notes:

## 2022-04-26 NOTE — Assessment & Plan Note (Addendum)
Unclear if pt post ictal. Certainly has purposeful movements, (not having convulsive status epilepticus at this time). Ordering Keppra 1g IV BID in place of his 1g BID PO dosing he takes at home. Defer to neurology if he needs cEEG monitoring or not.

## 2022-04-26 NOTE — ED Notes (Signed)
PA-C will at bedside to evaluate pt, post return from MRI.

## 2022-04-26 NOTE — ED Notes (Signed)
Pt to MRI

## 2022-04-26 NOTE — ED Notes (Signed)
Pt on way back to MRI, unable to do scan d/t pt's uncooperativeness, PA-C Will F notified.

## 2022-04-26 NOTE — Progress Notes (Addendum)
Physical Therapy Evaluation Patient Details Name: Miguel Black MRN: [id number] DOB: November 15, 1990 Today's Date: 04/26/2022  History of Present Illness  Pt is a 32 y/o male admitted 4/1 after seizure activity at home. PMH includes MS and seizures, satus epilepticus 2020.  Clinical Impression  Pt admitted with above diagnosis. Independent at home, uses quad cane for outdoor mobility only. No prior falls reported except for episode yesterday. Lives with family and available 24/7 for assistance. Patient with notable decline in functional independence. Requires mod assist for transfers and balance. Posterior instability with frequent assist for balance needed. Pleasant and oriented to place and situation, but unable to recall month or year. Left hand dominant with prior weakness of LUE and LLE at baseline however pt and family reports some additional loss of strength and notable decline in coordination of LLE present today. Pt currently with functional limitations due to the deficits listed below (see PT Problem List). Pt will benefit from acute skilled PT to increase their independence and safety with mobility to allow discharge.    Patient will benefit from intensive inpatient follow up therapy, >3 hours/day         Recommendations for follow up therapy are one component of a multi-disciplinary discharge planning process, led by the attending physician.  Recommendations may be updated based on patient status, additional functional criteria and insurance authorization.     Assistance Recommended at Discharge Frequent or constant Supervision/Assistance  Patient can return home with the following  A lot of help with walking and/or transfers;A lot of help with bathing/dressing/bathroom;Assistance with cooking/housework;Direct supervision/assist for financial management;Direct supervision/assist for medications management;Assist for transportation;Help with stairs or ramp for entrance    Equipment  Recommendations None recommended by PT  Recommendations for Other Services  Rehab consult    Functional Status Assessment Patient has had a recent decline in their functional status and demonstrates the ability to make significant improvements in function in a reasonable and predictable amount of time.     Precautions / Restrictions Precautions Precautions: Fall (Seizure) Restrictions Weight Bearing Restrictions: No      Mobility  Bed Mobility Overal bed mobility: Needs Assistance Bed Mobility: Supine to Sit, Sit to Supine     Supine to sit: Min guard Sit to supine: Min guard   General bed mobility comments: Min guard for safety, effortful to rise but without physical assistance. Managed lines/leads for pt. Some lack of trunk control during transitions.    Transfers Overall transfer level: Needs assistance Equipment used: Rolling walker (2 wheels) Transfers: Sit to/from Stand Sit to Stand: Mod assist           General transfer comment: Mod assist for boost and balance, demonstrating heavy right and posterior lean. Cues for awareness, weight shifting techniques and push RW down as he attempts to lift it from the ground as he leans towards right. Poor control with descent back into bed    Ambulation/Gait             Pre-gait activities: Pre gait trial with weight shifting and stationary march holding RW for support, min-mod assist for balance at all times. leaning Rt and posteriorly.    Stairs            Wheelchair Mobility    Modified Rankin (Stroke Patients Only)       Balance Overall balance assessment: Needs assistance Sitting-balance support: No upper extremity supported, Feet supported Sitting balance-Leahy Scale: Fair Sitting balance - Comments: Periodically demonstrates posterior LOB while muscle testing.  Able to self correct with use of UEs. Postural control: Posterior lean, Right lateral lean Standing balance support: Bilateral upper  extremity supported, During functional activity, Reliant on assistive device for balance Standing balance-Leahy Scale: Poor                               Pertinent Vitals/Pain Pain Assessment Pain Assessment: No/denies pain    Home Living Family/patient expects to be discharged to:: Private residence Living Arrangements: Parent Available Help at Discharge: Family;Available 24 hours/day Type of Home: Apartment Home Access: Stairs to enter Entrance Stairs-Rails: Right;Left;Can reach both Entrance Stairs-Number of Steps: 5 Alternate Level Stairs-Number of Steps: flight Home Layout: Two level Home Equipment: Conservation officer, nature (2 wheels);Shower seat;Grab bars - tub/shower;Cane - quad Additional Comments: Some information recored from prior admission. Mother works from home.    Prior Function Prior Level of Function : Independent/Modified Independent             Mobility Comments: Pt reports using quad cane for ambulation outdoors, no device indoors ADLs Comments: independent     Hand Dominance   Dominant Hand: Left    Extremity/Trunk Assessment   Upper Extremity Assessment Upper Extremity Assessment: Defer to OT evaluation    Lower Extremity Assessment Lower Extremity Assessment: LLE deficits/detail LLE Deficits / Details: Some difficulty with testing, posterior LOB with LE assessment on EOB. Strength 3+/5 hip flexion, 4/5 knee extension, 4/5 knee flexion, 4-/5 ankle DF. 4+/5 hip abduction LLE Sensation: WNL LLE Coordination: decreased fine motor;decreased gross motor       Communication   Communication: No difficulties  Cognition Arousal/Alertness: Awake/alert Behavior During Therapy: Flat affect Overall Cognitive Status: Impaired/Different from baseline Area of Impairment: Orientation, Memory, Problem solving, Following commands                 Orientation Level: Disoriented to, Time (Aware that he is in the hospital for possible seizure. Unable  to state month and year. Recognized mother in room.)   Memory: Decreased short-term memory Following Commands: Follows one step commands consistently, Follows one step commands with increased time     Problem Solving: Slow processing, Requires verbal cues General Comments: Following commands consistently, delayed processing.        General Comments General comments (skin integrity, edema, etc.): VSS throughout. Mother present very supportive    Exercises Other Exercises Other Exercises: Standing balance, weight shift, reaching for targets at LOS, min-mod assist for stability with single UE on RW for support.   Assessment/Plan    PT Assessment Patient needs continued PT services  PT Problem List Decreased strength;Decreased activity tolerance;Decreased balance;Decreased mobility;Decreased coordination;Decreased cognition;Decreased knowledge of use of DME;Decreased safety awareness;Decreased knowledge of precautions;Impaired tone       PT Treatment Interventions DME instruction;Gait training;Stair training;Functional mobility training;Therapeutic activities;Therapeutic exercise;Balance training;Cognitive remediation;Neuromuscular re-education;Patient/family education    PT Goals (Current goals can be found in the Care Plan section)  Acute Rehab PT Goals Patient Stated Goal: Get better, go home PT Goal Formulation: With patient/family Time For Goal Achievement: 05/10/22 Potential to Achieve Goals: Good    Frequency Min 4X/week     Co-evaluation               AM-PAC PT "6 Clicks" Mobility  Outcome Measure Help needed turning from your back to your side while in a flat bed without using bedrails?: A Little Help needed moving from lying on your back to sitting on the side of  a flat bed without using bedrails?: A Little Help needed moving to and from a bed to a chair (including a wheelchair)?: A Lot Help needed standing up from a chair using your arms (e.g., wheelchair or  bedside chair)?: A Lot Help needed to walk in hospital room?: A Lot Help needed climbing 3-5 steps with a railing? : A Lot 6 Click Score: 14    End of Session Equipment Utilized During Treatment: Gait belt Activity Tolerance: Patient tolerated treatment well Patient left: in bed;with call bell/phone within reach;with bed alarm set;with family/visitor present   PT Visit Diagnosis: Unsteadiness on feet (R26.81);Other abnormalities of gait and mobility (R26.89);Muscle weakness (generalized) (M62.81);History of falling (Z91.81);Difficulty in walking, not elsewhere classified (R26.2);Other symptoms and signs involving the nervous system (R29.898);Hemiplegia and hemiparesis Hemiplegia - Right/Left: Left Hemiplegia - dominant/non-dominant: Dominant Hemiplegia - caused by:  (Hx of MS, some baseline weakness on Lt.)    Time: [id number] PT Time Calculation (min) (ACUTE ONLY): 20 min   Charges:   PT Evaluation $PT Eval Low Complexity: [place], PT, DPT Physical Therapist Acute Rehabilitation Services [place]   Ellouise Newer 04/26/2022, 2:08 PM

## 2022-04-26 NOTE — Assessment & Plan Note (Addendum)
Looks like he is on Ocrevus as outpt. Unclear if he is currently having a flare.  Unable to get MRI as above.  Will update neurology on failure and see how they want to proceed.

## 2022-04-26 NOTE — Progress Notes (Signed)
EEG complete - results pending 

## 2022-04-26 NOTE — Evaluation (Signed)
Clinical/Bedside Swallow Evaluation Patient Details  Name: Miguel Black MRN: [id number] Date of Birth: 07/26/1990  Today's Date: 04/26/2022 Time: SLP Start Time (ACUTE ONLY): 1300 SLP Stop Time (ACUTE ONLY): 1315 SLP Time Calculation (min) (ACUTE ONLY): 15 min  Past Medical History:  Past Medical History:  Diagnosis Date   MS (multiple sclerosis) ([place])    Seizure ([place])    Status epilepticus ([place]) 07/12/2018   Past Surgical History:  Past Surgical History:  Procedure Laterality Date   NO PAST SURGERIES     HPI:  Patient is a 32 y.o. male with PMH: MS, seizures, debility. He presented to the hospital from home on 04/26/22 after family found him to be altered and unable to follow commands, not talking. On arrival to ED, patient with AMS and was admitted with acute metabolic encephalopathy likely in setting of PNA (aspiration PNA) versus other neurologic concerns such as MS flare, seizure activity of stroke. CT head was motion degraded without definitive evidence for acute intracranial abnormality. MRI brain pending.    Assessment / Plan / Recommendation  Clinical Impression  Patient is presnting with clinical s/s of what appears to be a cognitive-based dysphagia. No overt s/s aspiration or penetration observed with PO consistencies of thin liquids and mechanical soft solids consumed. Patient did exhibit suspected swallow initiation delay. He took individual sips each time, and did exhibit instances of swallowing twice. With solids (graham cracker), he exhibited mildly prolonged mastication and oral transit but otherwise tolerated well. Patient and mother both deny him having any dysphagia prior to current admission. SLP is recommending to initiate PO diet of Dys 3 (mechanical soft) solids and thin liquids and will follow for toleration. SLP Visit Diagnosis: Dysphagia, unspecified (R13.10)    Aspiration Risk  Mild aspiration risk    Diet Recommendation Dysphagia 3 (Mech soft);Thin liquid    Liquid Administration via: Cup;Straw Medication Administration: Whole meds with puree Supervision: Patient able to self feed;Intermittent supervision to cue for compensatory strategies Compensations: Slow rate;Small sips/bites;Minimize environmental distractions Postural Changes: Seated upright at 90 degrees    Other  Recommendations Oral Care Recommendations: Oral care BID    Recommendations for follow up therapy are one component of a multi-disciplinary discharge planning process, led by the attending physician.  Recommendations may be updated based on patient status, additional functional criteria and insurance authorization.  Follow up Recommendations Follow physician's recommendations for discharge plan and follow up therapies      Assistance Recommended at Discharge    Functional Status Assessment Patient has had a recent decline in their functional status and demonstrates the ability to make significant improvements in function in a reasonable and predictable amount of time.  Frequency and Duration min 2x/week  2 weeks       Prognosis Prognosis for improved oropharyngeal function: Good      Swallow Study   General Date of Onset: 04/26/22 HPI: Patient is a 32 y.o. male with PMH: MS, seizures, debility. He presented to the hospital from home on 04/26/22 after family found him to be altered and unable to follow commands, not talking. On arrival to ED, patient with AMS and was admitted with acute metabolic encephalopathy likely in setting of PNA (aspiration PNA) versus other neurologic concerns such as MS flare, seizure activity of stroke. CT head was motion degraded without definitive evidence for acute intracranial abnormality. MRI brain pending. Type of Study: Bedside Swallow Evaluation Previous Swallow Assessment: during previous admission in 2023 Diet Prior to this Study: NPO Temperature Spikes  Noted: No Respiratory Status: Room air History of Recent Intubation:  No Behavior/Cognition: Alert;Cooperative Oral Cavity Assessment: Within Functional Limits Oral Care Completed by SLP: No Oral Cavity - Dentition: Adequate natural dentition Vision: Functional for self-feeding Self-Feeding Abilities: Able to feed self;Needs set up Patient Positioning: Upright in bed Baseline Vocal Quality: Normal Volitional Swallow: Able to elicit    Oral/Motor/Sensory Function Overall Oral Motor/Sensory Function: Other (comment) (patient with generalized, slow oral motor movements)   Ice Chips     Thin Liquid Thin Liquid: Impaired Presentation: Straw Oral Phase Functional Implications: Oral holding Pharyngeal  Phase Impairments: Suspected delayed Swallow    Nectar Thick     Honey Thick     Puree Puree: Not tested   Solid     Solid: Impaired Oral Phase Functional Implications: Impaired mastication     Sonia Baller, MA, CCC-SLP Speech Therapy

## 2022-04-26 NOTE — ED Notes (Signed)
This RN and NT Amber attempted to In & Out cath pt, but was unsuccessful. While prepping to straight cath pt, Pt grab his penis and being urinating towards the NT and moving his penis around, urine streamed on seizures pads, blankets, and unto floor. Attempted to catch stream, but was unsuccessful. Unable to redirect pt.   Full linen change and brief change, all monitoring cords reconnected as pt had removed those prior. Pt repositioned in bed, gown placed on pt, and warm blanket given.

## 2022-04-26 NOTE — ED Notes (Signed)
Per PA-C Will, hold 0.5 mg Ativan until pt is ready for MRI scan, still waiting on MRI to call advising they are ready to attempt for re-scan of pt.

## 2022-04-26 NOTE — ED Notes (Signed)
Fall risk bracelet, socks, and sign posted outside of room for patient safety.

## 2022-04-26 NOTE — Assessment & Plan Note (Addendum)
Pt with fever, WBC.  Concern that the faint density in LLL field on x ray may actually represent PNA / aspiration. PNA pathway Empiric rocephin + azithromycin Covid, flu, RSV neg

## 2022-04-26 NOTE — Procedures (Signed)
Patient Name: DONNAVIN BELFLOWER  MRN: [id number]  Epilepsy Attending: Lora Havens  Referring Physician/Provider: Kerney Elbe, MD  Date: 04/26/2022 Duration: 24.30 mins  Patient history: 32 y.o. male with medical history significant of MS, seizures, debility. EEG to evaluate for seizure  Level of alertness: Awake  AEDs during EEG study: LEV, Ativan  Technical aspects: This EEG study was done with scalp electrodes positioned according to the 10-20 International system of electrode placement. Electrical activity was reviewed with band pass filter of 1-70Hz , sensitivity of 7 uV/mm, display speed of 29mm/sec with a 60Hz  notched filter applied as appropriate. EEG data were recorded continuously and digitally stored.  Video monitoring was available and reviewed as appropriate.  Description: EEG showed continuous generalized 3 to 6 Hz theta-delta slowing admixed with 15 to 18 Hz beta activity  distributed symmetrically and diffusely. Hyperventilation and photic stimulation were not performed.      ABNORMALITY - Continuous slow, generalized - Excessive beta, generalized   IMPRESSION: This study is suggestive of severe diffuse encephalopathy, nonspecific etiology but likely related to sedation. No seizures or epileptiform discharges were seen throughout the recording.   Avila Albritton Barbra Sarks

## 2022-04-26 NOTE — Assessment & Plan Note (Addendum)
DDx includes: 1) Delirium from PNA 2) post-ictal state / seizure 3) MS flare or stroke 4) combination of above 5) other causes  Most concerned about neurologic causes (seizure, stroke, MS flare), given the focal neurologic deficits on exam in ED early on (R sided weakness initially in ED). Neurology consulted MRI ordered but unable to be obtained due to pt agitation, attempted after ativan but still wouldn't hold still Suspect he may need full anesthesia / intubation if we want to get MRI at this stage. Will defer need for EEG to neuro. Tele monitor Address some possible causes as below.

## 2022-04-26 NOTE — ED Notes (Signed)
Pt transported to MRI 

## 2022-04-26 NOTE — Progress Notes (Signed)
  Inpatient Rehabilitation Admissions Coordinator   Per therapy recommendations patient was screened for CIR candidacy by Danne Baxter RN MSN. [employer] is out of network for [facility]. Recommend to pursue AIR level rehab with in network provider. I will not place a Rehab Conuslt at this time.   Danne Baxter, RN, MSN Rehab Admissions Coordinator [phone] 04/26/2022 2:55 PM

## 2022-04-26 NOTE — H&P (Signed)
History and Physical    Patient: Miguel Black [id number] DOB: 1990/08/01 DOA: 04/25/2022 DOS: the patient was seen and examined on 04/26/2022 PCP: Pcp, No  Patient coming from: Home  Chief Complaint:  Chief Complaint  Patient presents with   Seizures   HPI: Miguel Black is a 32 y.o. male with medical history significant of MS, seizures, debility.  At baseline he has L > R sided weakness it seems based on PT notes from Aug last year.  Patient LKW 4am Monday.  Noted to be altered by 11am Monday, awake in bed but unable to follow commands and not talking per family.  At baseline pt AAOx4 and ambulatory.  On arrival to ED pt with AMS.  Pt mother denies missed doses of keppra.   Review of Systems: As mentioned in the history of present illness. All other systems reviewed and are negative. Past Medical History:  Diagnosis Date   MS (multiple sclerosis) ([place])    Seizure ([place])    Status epilepticus ([place]) 07/12/2018   Past Surgical History:  Procedure Laterality Date   NO PAST SURGERIES     Social History:  reports that he has been smoking cigars. He has never used smokeless tobacco. He reports current alcohol use. He reports current drug use. Drug: Marijuana.  Allergies  Allergen Reactions   Dalfampridine Other (See Comments)    Seizure 02/2021 while on dalfampridine    Family History  Problem Relation Age of Onset   Hypertension Mother    Healthy Father    Healthy Sister    Healthy Brother    Diabetes Maternal Grandmother    Hypertension Maternal Grandfather    Hodgkin's lymphoma Brother    Colon cancer Maternal Uncle 45    Prior to Admission medications   Medication Sig Start Date End Date Taking? Authorizing Provider  levETIRAcetam (KEPPRA) 1000 MG tablet Take 1 tablet (1,000 mg total) by mouth 2 (two) times daily. 01/05/22  Yes Sater, Nanine Means, MD  zonisamide (ZONEGRAN) 100 MG capsule Take 1 capsule (100 mg total) by mouth daily. 01/05/22  Yes Sater,  Nanine Means, MD  cholecalciferol (VITAMIN D3) 25 MCG (1000 UNIT) tablet Take 5,000 Units by mouth daily.    [provider]  OCREVUS 300 MG/10ML injection INFUSE 600MG  IN 500ML 0.9% NS INTRAVENOUSLY AT 40ML\HR AND INCREASE BY 40ML\HR EVERY 30 MINUTES (MAX 200ML\HR) OVER AT LEAST 3.5HRS AS DIRECTED EVERY 6 MONTHS. 06/07/21   Britt Bottom, MD    Physical Exam: Vitals:   04/26/22 0445 04/26/22 0500 04/26/22 0515 04/26/22 0530  BP: 121/83 118/81 122/71 118/68  Pulse: 83 62 71 (!) 58  Resp: (!) 24 19 17 16   Temp:      TempSrc:      SpO2: 96% 96% 92% 99%   Constitutional: Obtunded Respiratory: clear to auscultation bilaterally, no wheezing, no crackles. Normal respiratory effort. No accessory muscle use.  Cardiovascular: Regular rate and rhythm, no murmurs / rubs / gallops. No extremity edema. 2+ pedal pulses. No carotid bruits.  Abdomen: no tenderness, no masses palpated. No hepatosplenomegaly. Bowel sounds positive.  Neurologic: Moving L side more than R side on initial evaluation by EDP.  Moving all 4 extremities with greater strength on R than L side later on during ED stay it seems. Psychiatric: Confused, non-verbal, obtunded  Data Reviewed:       Latest Ref Rng & Units 04/26/2022    5:20 AM 04/25/2022    7:40 PM 01/05/2022    9:36  AM  CBC  WBC 4.0 - 10.5 K/uL 15.3  17.6  6.0   Hemoglobin 13.0 - 17.0 g/dL 12.3  14.3  16.9   Hematocrit 39.0 - 52.0 % 37.8  43.1  49.2   Platelets 150 - 400 K/uL 220  237  257       Latest Ref Rng & Units 04/26/2022    5:20 AM 04/25/2022    7:40 PM 11/14/2021    9:20 PM  CMP  Glucose 70 - 99 mg/dL 107  114  108   BUN 6 - 20 mg/dL 6  5  9    Creatinine 0.61 - 1.24 mg/dL 0.87  0.96  1.07   Sodium 135 - 145 mmol/L 137  137  138   Potassium 3.5 - 5.1 mmol/L 3.5  3.9  5.4   Chloride 98 - 111 mmol/L 104  100  104   CO2 22 - 32 mmol/L 25  25  24    Calcium 8.9 - 10.3 mg/dL 8.2  8.7  9.2   Total Protein 6.5 - 8.1 g/dL 5.7  6.7  7.5   Total  Bilirubin 0.3 - 1.2 mg/dL 0.4  0.5  0.4   Alkaline Phos 38 - 126 U/L 65  82  63   AST 15 - 41 U/L 50  62  25   ALT 0 - 44 U/L 56  59  17    COVID, FLU, RSV = neg  Urinalysis    Component Value Date/Time   COLORURINE YELLOW 04/26/2022 0150   APPEARANCEUR HAZY (A) 04/26/2022 0150   LABSPEC 1.027 04/26/2022 0150   PHURINE 7.0 04/26/2022 0150   GLUCOSEU NEGATIVE 04/26/2022 0150   HGBUR NEGATIVE 04/26/2022 0150   BILIRUBINUR NEGATIVE 04/26/2022 0150   KETONESUR NEGATIVE 04/26/2022 0150   PROTEINUR 30 (A) 04/26/2022 0150   NITRITE NEGATIVE 04/26/2022 0150   LEUKOCYTESUR NEGATIVE 04/26/2022 0150    Drugs of Abuse     Component Value Date/Time   LABOPIA NONE DETECTED 04/26/2022 0150   COCAINSCRNUR NONE DETECTED 04/26/2022 0150   LABBENZ NONE DETECTED 04/26/2022 0150   AMPHETMU NONE DETECTED 04/26/2022 0150   THCU POSITIVE (A) 04/26/2022 0150   LABBARB NONE DETECTED 04/26/2022 0150    CT head = IMPRESSION: 1. Motion degraded exam without definite CT evidence for acute intracranial abnormality. 2. White matter disease without significant change  CXR: IMPRESSION: Faint density in the left lower lung field, likely artifactual or atelectasis. No consolidative changes.    Assessment and Plan: * Acute encephalopathy DDx includes: 1) Delirium from PNA 2) post-ictal state / seizure 3) MS flare or stroke 4) combination of above 5) other causes  Most concerned about neurologic causes (seizure, stroke, MS flare), given the focal neurologic deficits on exam in ED early on (R sided weakness initially in ED). Neurology consulted MRI ordered but unable to be obtained due to pt agitation, attempted after ativan but still wouldn't hold still Suspect he may need full anesthesia / intubation if we want to get MRI at this stage. Will defer need for EEG to neuro. Tele monitor Address some possible causes as below.  Aspiration pneumonia Pt with fever, WBC.  Concern that the faint  density in LLL field on x ray may actually represent PNA / aspiration. PNA pathway Empiric rocephin + azithromycin Covid, flu, RSV neg  Seizure disorder Unclear if pt post ictal. Certainly has purposeful movements, (not having convulsive status epilepticus at this time). Ordering Keppra 1g IV BID in place of his  1g BID PO dosing he takes at home. Defer to neurology if he needs cEEG monitoring or not.  Relapsing remitting multiple sclerosis Looks like he is on Ocrevus as outpt. Unclear if he is currently having a flare.  Unable to get MRI as above.  Will update neurology on failure and see how they want to proceed.      Advance Care Planning:   Code Status: Full Code  Consults: Dr. Cheral Marker  Family Communication: No family in room  Severity of Illness: The appropriate patient status for this patient is OBSERVATION. Observation status is judged to be reasonable and necessary in order to provide the required intensity of service to ensure the patient's safety. The patient's presenting symptoms, physical exam findings, and initial radiographic and laboratory data in the context of their medical condition is felt to place them at decreased risk for further clinical deterioration. Furthermore, it is anticipated that the patient will be medically stable for discharge from the hospital within 2 midnights of admission.   Author: Etta Quill., DO 04/26/2022 5:58 AM  For on call review [email or web address].

## 2022-04-26 NOTE — Progress Notes (Signed)
New patient here from the ed with seizures. Patient is alert and oriented 3-4. Patient denies complaints. MP shows NSR. Patient oriented to room bed  and valuables policy. Patient chooses to keep valuables at bedside.

## 2022-04-26 NOTE — Progress Notes (Signed)
PROGRESS NOTE    Miguel Black  [id number] DOB: [birth year] DOA: 04/25/2022 PCP: Pcp, No   Brief Narrative:    Miguel Black is a 32 y.o. male with medical history significant of MS, seizures, debility. Miguel Black LKW 4am Monday. Noted to be altered by 11am Monday, awake in bed but unable to follow commands and not talking per family. At baseline pt AAOx4 and ambulatory. On arrival to ED pt with AMS.  Miguel Black is being admitted with acute metabolic encephalopathy likely in the setting of pneumonia versus other neurologic concerns such as MS flare, seizure activity, or stroke.  Brain MRI pending as well as further neurology workup.  Miguel Black is noted to have aspiration pneumonia and has been started on Rocephin and azithromycin.  SLP evaluation pending.  Assessment & Plan:   Principal Problem:   Acute encephalopathy Active Problems:   Aspiration pneumonia   Relapsing remitting multiple sclerosis   Seizure disorder   Neurological deficit present   Acute metabolic encephalopathy  Assessment and Plan:  Acute encephalopathy DDx includes: 1) Delirium from PNA 2) post-ictal state / seizure 3) MS flare or stroke 4) combination of above 5) other causes   Most concerned about neurologic causes (seizure, stroke, MS flare), given the focal neurologic deficits on exam in ED early on (R sided weakness initially in ED). Neurology consulted MRI ordered but unable to be obtained due to pt agitation, attempted after ativan but still wouldn't hold still Suspect Miguel Black may need full anesthesia / intubation if we want to get MRI at this stage. EEG with diffuse encephalopathy and no findings of seizure activity at this time Tele monitor Address some possible causes as below.   Aspiration pneumonia Pt with fever, WBC.  Concern that the faint density in LLL field on x ray may actually represent PNA / aspiration. PNA pathway Empiric rocephin + azithromycin Covid, flu, RSV neg Obtain SLP evaluation    Seizure disorder Unclear if pt post ictal. Certainly has purposeful movements, (not having convulsive status epilepticus at this time). Ordering Keppra 1g IV BID in place of his 1g BID PO dosing Miguel Black takes at home. Defer to neurology if Miguel Black needs cEEG monitoring or not.   Relapsing remitting multiple sclerosis Looks like Miguel Black is on Ocrevus as outpt. Unclear if Miguel Black is currently having a flare.  Unable to get MRI as above.  Will update neurology on failure and see how they want to proceed.  DVT prophylaxis:Lovenox Code Status: Full Family Communication: None at bedside Disposition Plan:  Status is: Observation The Miguel Black will require care spanning > 2 midnights and should be moved to inpatient because: IV medications.  Consultants:  Neurology  Procedures:  None  Antimicrobials:  Anti-infectives (From admission, onward)    Start     Dose/Rate Route Frequency Ordered Stop   04/26/22 2200  cefTRIAXone (ROCEPHIN) 2 g in sodium chloride 0.9 % 100 mL IVPB        2 g 200 mL/hr over 30 Minutes Intravenous Every 24 hours 04/26/22 0504 05/01/22 2159   04/26/22 2200  azithromycin (ZITHROMAX) 500 mg in sodium chloride 0.9 % 250 mL IVPB        500 mg 250 mL/hr over 60 Minutes Intravenous Every 24 hours 04/26/22 0504 05/01/22 2159   04/25/22 2145  cefTRIAXone (ROCEPHIN) 1 g in sodium chloride 0.9 % 100 mL IVPB        1 g 200 mL/hr over 30 Minutes Intravenous  Once 04/25/22 2132 04/25/22 2158  04/25/22 2145  azithromycin (ZITHROMAX) 500 mg in sodium chloride 0.9 % 250 mL IVPB        500 mg 250 mL/hr over 60 Minutes Intravenous  Once 04/25/22 2132 04/25/22 2315       Subjective: Miguel Black seen and evaluated today with no new acute complaints or concerns. No acute concerns or events noted overnight.  Miguel Black appears confused this morning and cannot tell me where Miguel Black is or the year.  Objective: Vitals:   04/26/22 0830 04/26/22 0900 04/26/22 0953 04/26/22 1032  BP: 133/79 (!) 126/97 123/83    Pulse: 72 88 67   Resp: 16 (!) 28 (!) 22   Temp:    99.4 F (37.4 C)  TempSrc:    Oral  SpO2: 97% 95% 100%     Intake/Output Summary (Last 24 hours) at 04/26/2022 1052 Last data filed at 04/26/2022 [id number] Gross per 24 hour  Intake 2700.78 ml  Output --  Net 2700.78 ml   There were no vitals filed for this visit.  Examination:  General exam: Appears calm and comfortable, pleasant but confused Respiratory system: Clear to auscultation. Respiratory effort normal. Cardiovascular system: S1 & S2 heard, RRR.  Gastrointestinal system: Abdomen is soft Central nervous system: Alert and awake Extremities: No edema Skin: No significant lesions noted Psychiatry: Flat affect.    Data Reviewed: I have personally reviewed following labs and imaging studies  CBC: Recent Labs  Lab 04/25/22 1940 04/26/22 0520  WBC 17.6* 15.3*  NEUTROABS 16.0*  --   HGB 14.3 12.3*  HCT 43.1 37.8*  MCV 97.5 98.2  PLT 237 [id number]   Basic Metabolic Panel: Recent Labs  Lab 04/25/22 1940 04/26/22 0520  NA 137 137  K 3.9 3.5  CL 100 104  CO2 25 25  GLUCOSE 114* 107*  BUN 5* 6  CREATININE 0.96 0.87  CALCIUM 8.7* 8.2*   GFR: CrCl cannot be calculated (Unknown ideal weight.). Liver Function Tests: Recent Labs  Lab 04/25/22 1940 04/26/22 0520  AST 62* 50*  ALT 59* 56*  ALKPHOS 82 65  BILITOT 0.5 0.4  PROT 6.7 5.7*  ALBUMIN 3.9 3.2*   No results for input(s): "LIPASE", "AMYLASE" in the last 168 hours. No results for input(s): "AMMONIA" in the last 168 hours. Coagulation Profile: No results for input(s): "INR", "PROTIME" in the last 168 hours. Cardiac Enzymes: No results for input(s): "CKTOTAL", "CKMB", "CKMBINDEX", "TROPONINI" in the last 168 hours. BNP (last 3 results) No results for input(s): "PROBNP" in the last 8760 hours. HbA1C: No results for input(s): "HGBA1C" in the last 72 hours. CBG: Recent Labs  Lab 04/25/22 1943  GLUCAP 129*   Lipid Profile: No results for input(s):  "CHOL", "HDL", "LDLCALC", "TRIG", "CHOLHDL", "LDLDIRECT" in the last 72 hours. Thyroid Function Tests: No results for input(s): "TSH", "T4TOTAL", "FREET4", "T3FREE", "THYROIDAB" in the last 72 hours. Anemia Panel: No results for input(s): "VITAMINB12", "FOLATE", "FERRITIN", "TIBC", "IRON", "RETICCTPCT" in the last 72 hours. Sepsis Labs: No results for input(s): "PROCALCITON", "LATICACIDVEN" in the last 168 hours.  Recent Results (from the past 240 hour(s))  Resp panel by RT-PCR (RSV, Flu A&B, Covid) Anterior Nasal Swab     Status: None   Collection Time: 04/25/22  9:36 PM   Specimen: Anterior Nasal Swab  Result Value Ref Range Status   SARS Coronavirus 2 by RT PCR NEGATIVE NEGATIVE Final   Influenza A by PCR NEGATIVE NEGATIVE Final   Influenza B by PCR NEGATIVE NEGATIVE Final    Comment: (NOTE) The  Xpert Xpress SARS-CoV-2/FLU/RSV plus assay is intended as an aid in the diagnosis of influenza from Nasopharyngeal swab specimens and should not be used as a sole basis for treatment. Nasal washings and aspirates are unacceptable for Xpert Xpress SARS-CoV-2/FLU/RSV testing.  Fact Sheet for Patients: EntrepreneurPulse.com.au  Fact Sheet for Healthcare Providers: IncredibleEmployment.be  This test is not yet approved or cleared by the [place] FDA and has been authorized for detection and/or diagnosis of SARS-CoV-2 by FDA under an Emergency Use Authorization (EUA). This EUA will remain in effect (meaning this test can be used) for the duration of the COVID-19 declaration under Section 564(b)(1) of the Act, 21 U.S.C. section 360bbb-3(b)(1), unless the authorization is terminated or revoked.     Resp Syncytial Virus by PCR NEGATIVE NEGATIVE Final    Comment: (NOTE) Fact Sheet for Patients: EntrepreneurPulse.com.au  Fact Sheet for Healthcare Providers: IncredibleEmployment.be  This test is not yet approved  or cleared by the [place] FDA and has been authorized for detection and/or diagnosis of SARS-CoV-2 by FDA under an Emergency Use Authorization (EUA). This EUA will remain in effect (meaning this test can be used) for the duration of the COVID-19 declaration under Section 564(b)(1) of the Act, 21 U.S.C. section 360bbb-3(b)(1), unless the authorization is terminated or revoked.  Performed at [facility], [place] [address] [place], [place] [address]          Radiology Studies: EEG adult  Result Date: 2022-04-30 Lora Havens, MD     2022-04-30  9:10 AM Miguel Black Name: ERYCK COEN MRN: [id number] Epilepsy Attending: Lora Havens Referring Physician/Provider: Kerney Elbe, MD Date: 04/30/22 Duration: 24.30 mins Miguel Black history: 32 y.o. male with medical history significant of MS, seizures, debility. EEG to evaluate for seizure Level of alertness: Awake AEDs during EEG study: LEV, Ativan Technical aspects: This EEG study was done with scalp electrodes positioned according to the 10-20 International system of electrode placement. Electrical activity was reviewed with band pass filter of 1-70Hz , sensitivity of 7 uV/mm, display speed of 80mm/sec with a 60Hz  notched filter applied as appropriate. EEG data were recorded continuously and digitally stored.  Video monitoring was available and reviewed as appropriate. Description: EEG showed continuous generalized 3 to 6 Hz theta-delta slowing admixed with 15 to 18 Hz beta activity  distributed symmetrically and diffusely. Hyperventilation and photic stimulation were not performed.    ABNORMALITY - Continuous slow, generalized - Excessive beta, generalized  IMPRESSION: This study is suggestive of severe diffuse encephalopathy, nonspecific etiology but likely related to sedation. No seizures or epileptiform discharges were seen throughout the recording.  Lora Havens   CT Head Wo Contrast  Result Date: 04/25/2022 CLINICAL DATA:   Seizure EXAM: CT HEAD WITHOUT CONTRAST TECHNIQUE: Contiguous axial images were obtained from the base of the skull through the vertex without intravenous contrast. RADIATION DOSE REDUCTION: This exam was performed according to the departmental dose-optimization program which includes automated exposure control, adjustment of the mA and/or kV according to Miguel Black size and/or use of iterative reconstruction technique. COMPARISON:  MRI 01/26/2022, CT brain 11/14/2021 FINDINGS: Brain: No acute territorial infarction, hemorrhage or intracranial mass. Mild motion degradation. White matter hypodensity asymmetric to the left without significant change. Nonenlarged ventricles Vascular: No hyperdense vessels, no unexpected calcification Skull: Normal. Negative for fracture or focal lesion. Sinuses/Orbits: No acute finding. Other: None IMPRESSION: 1. Motion degraded exam without definite CT evidence for acute intracranial abnormality. 2. White matter disease without significant change Electronically Signed   By:  Donavan Foil M.D.   On: 04/25/2022 21:22   DG Chest Portable 1 View  Result Date: 04/25/2022 CLINICAL DATA:  Altered mental status. EXAM: PORTABLE CHEST 1 VIEW COMPARISON:  Chest radiograph dated 11/14/2021. FINDINGS: Faint density in the left lower lung field, likely artifactual or atelectasis. Developing infiltrate is less likely. No consolidative changes. There is no pleural effusion pneumothorax. The cardiac silhouette is within normal limits. No acute osseous pathology. IMPRESSION: Faint density in the left lower lung field, likely artifactual or atelectasis. No consolidative changes. Electronically Signed   By: Anner Crete M.D.   On: 04/25/2022 19:50        Scheduled Meds:  acetaminophen  650 mg Oral Once   enoxaparin (LOVENOX) injection  40 mg Subcutaneous Q24H   Continuous Infusions:  sodium chloride Stopped (04/26/22 0521)   azithromycin     cefTRIAXone (ROCEPHIN)  IV     lactated  ringers 100 mL/hr at 04/26/22 0907   levETIRAcetam Stopped (04/26/22 0935)     LOS: 0 days    Time spent: 35 minutes    Marrisa Kimber Darleen Crocker, DO [facility]  If 7PM-7AM, please contact night-coverage [email or web address] 04/26/2022, 10:52 AM

## 2022-04-26 NOTE — ED Notes (Signed)
Called MRI, they advised pt should be ready for MRI in approx 15 mins, advised to give ativan when transport arrives.

## 2022-04-26 NOTE — ED Notes (Signed)
MRI notified that pt received ativan IV, would like to re-scan pt, MRI has someone at this time, will call back once they are ready to re-scan pt

## 2022-04-27 ENCOUNTER — Other Ambulatory Visit (HOSPITAL_COMMUNITY): Payer: Self-pay

## 2022-04-27 DIAGNOSIS — G40909 Epilepsy, unspecified, not intractable, without status epilepticus: Secondary | ICD-10-CM | POA: Diagnosis not present

## 2022-04-27 DIAGNOSIS — G9340 Encephalopathy, unspecified: Secondary | ICD-10-CM | POA: Diagnosis not present

## 2022-04-27 DIAGNOSIS — J690 Pneumonitis due to inhalation of food and vomit: Secondary | ICD-10-CM | POA: Diagnosis not present

## 2022-04-27 DIAGNOSIS — G35 Multiple sclerosis: Secondary | ICD-10-CM | POA: Diagnosis not present

## 2022-04-27 LAB — COMPREHENSIVE METABOLIC PANEL
ALT: 56 U/L — ABNORMAL HIGH (ref 0–44)
AST: 42 U/L — ABNORMAL HIGH (ref 15–41)
Albumin: 3.7 g/dL (ref 3.5–5.0)
Alkaline Phosphatase: 65 U/L (ref 38–126)
Anion gap: 13 (ref 5–15)
BUN: 9 mg/dL (ref 6–20)
CO2: 22 mmol/L (ref 22–32)
Calcium: 8.7 mg/dL — ABNORMAL LOW (ref 8.9–10.3)
Chloride: 101 mmol/L (ref 98–111)
Creatinine, Ser: 0.93 mg/dL (ref 0.61–1.24)
GFR, Estimated: >60 mL/min (ref >60)
Glucose, Bld: 99 mg/dL (ref 70–99)
Potassium: 3.5 mmol/L (ref 3.5–5.1)
Sodium: 136 mmol/L (ref 135–145)
Total Bilirubin: 0.8 mg/dL (ref 0.3–1.2)
Total Protein: 6.4 g/dL — ABNORMAL LOW (ref 6.5–8.1)

## 2022-04-27 LAB — CBC
HCT: 39.9 % (ref 39.0–52.0)
Hemoglobin: 13.5 g/dL (ref 13.0–17.0)
MCH: 32.1 pg (ref 26.0–34.0)
MCHC: 33.8 g/dL (ref 30.0–36.0)
MCV: 95 fL (ref 80.0–100.0)
Platelets: 179 10*3/uL (ref 150–400)
RBC: 4.2 MIL/uL — ABNORMAL LOW (ref 4.22–5.81)
RDW: 12.7 % (ref 11.5–15.5)
WBC: 8.3 10*3/uL (ref 4.0–10.5)
nRBC: 0 % (ref 0.0–0.2)

## 2022-04-27 LAB — MAGNESIUM: Magnesium: 1.9 mg/dL (ref 1.7–2.4)

## 2022-04-27 MED ORDER — ZONISAMIDE 100 MG PO CAPS
200.0000 mg | ORAL_CAPSULE | Freq: Every day | ORAL | 1 refills | Status: DC
  Filled 2022-04-27: qty 90, 45d supply, fill #0
  Filled 2022-06-16: qty 90, 45d supply, fill #1

## 2022-04-27 MED ORDER — AMOXICILLIN-POT CLAVULANATE 875-125 MG PO TABS
1.0000 | ORAL_TABLET | Freq: Two times a day (BID) | ORAL | 0 refills | Status: AC
  Filled 2022-04-27: qty 6, 3d supply, fill #0

## 2022-04-27 MED ORDER — AMOXICILLIN-POT CLAVULANATE 875-125 MG PO TABS
1.0000 | ORAL_TABLET | Freq: Two times a day (BID) | ORAL | Status: DC
  Administered 2022-04-27 – 2022-04-28 (×3): 1 via ORAL
  Filled 2022-04-27 (×3): qty 1

## 2022-04-27 NOTE — Discharge Summary (Signed)
PATIENT DETAILS Name: Miguel Black Age: 32 y.o. Sex: male Date of Birth: 06-03-1990 MRN: [id number]. Admitting Physician: Rodena Goldmann, DO PCP:Pcp, No  Admit Date: 04/25/2022 Discharge date: 04/28/2022  Recommendations for Outpatient Follow-up:  Follow up with PCP in 1-2 weeks Please obtain CMP/CBC in one week Ensure follow up with Dr Felecia Shelling  Admitted From:  Home  Disposition: Home with outpatient PT (Mother refused inpatient rehab)   Discharge Condition: fair  CODE STATUS:   Code Status: Full Code   Diet recommendation:  Diet Order             Diet regular Room service appropriate? Yes; Fluid consistency: Thin  Diet effective now           Diet general                    Brief Summary: Patient is a 32 y.o.  male with history of MS, seizure disorder who confusion-thought to be due to a prolonged postictal state following a breakthrough seizure.   Significant events: 4/1>> admit to [facility]   Significant studies: 4/1>> CT head: No acute intracranial abnormality 4/1>> CXR: Faint density-likely artifactual.  No consolidative changes. 4/2>> Spot EEG: No seizures   Significant microbiology data: 4/1>> COVID/influenza/RSV PCR: Negative   Procedures: None   Consults: Neurology  Brief Hospital Course: Acute encephalopathy Likely due to prolonged postictal state from a breakthrough seizure Encephalopathy has resolved-completely awake/alert   Seizure disorder with breakthrough seizure Neurology following-EEG negative Continue Keppra, Zonegran dosage increased Follow with Neuro as outpatient See seizure precautions/restrictions as below   Possible aspiration pneumonia Afebrile-nontoxic-appearing-clinically improved No longer on  Rocephin/Zithromax-switched to Augmentin for a few more days.   Relapsing/meeting MS Per neurology-since clinically improved-no need to pursue MRI brain Not thought to have a flare Resume outpatient follow with Dr Felecia Shelling    Debility/deconditioning PT recommending acute inpatient rehab-however mother prefers outpatient PT-rather than  inpatient  rehab at [facility] (CIR not a option as not in network). Case Manager has sent referral to outpatient PT.    BMI: Estimated body mass index is 23.85 kg/m as calculated from the following:   Height as of 01/05/22: 5\' 9"  (1.753 m).   Weight as of 01/05/22: 73.3 kg.    Discharge Diagnoses:  Principal Problem:   Acute encephalopathy Active Problems:   Aspiration pneumonia   Relapsing remitting multiple sclerosis   Seizure disorder   Neurological deficit present   Acute metabolic encephalopathy   Discharge Instructions:  Activity:  As tolerated with Full fall precautions use walker/cane & assistance as needed  Discharge Instructions     Diet general   Complete by: As directed    Discharge instructions   Complete by: As directed    Follow with Primary MD in 1-2 weeks  Please get a complete blood count and chemistry panel checked by your Primary MD at your next visit, and again as instructed by your Primary MD.  Get Medicines reviewed and adjusted: Please take all your medications with you for your next visit with your Primary MD  Laboratory/radiological data: Please request your Primary MD to go over all hospital tests and procedure/radiological results at the follow up, please ask your Primary MD to get all Hospital records sent to his/her office.  In some cases, they will be blood work, cultures and biopsy results pending at the time of your discharge. Please request that your primary care M.D. follows up on these results.  Also Note  the following: If you experience worsening of your admission symptoms, develop shortness of breath, life threatening emergency, suicidal or homicidal thoughts you must seek medical attention immediately by calling 911 or calling your MD immediately  if symptoms less severe.  You must read complete instructions/literature  along with all the possible adverse reactions/side effects for all the Medicines you take and that have been prescribed to you. Take any new Medicines after you have completely understood and accpet all the possible adverse reactions/side effects.   Do not drive when taking Pain medications or sleeping medications (Benzodaizepines)  Do not take more than prescribed Pain, Sleep and Anxiety Medications. It is not advisable to combine anxiety,sleep and pain medications without talking with your primary care practitioner  Special Instructions: If you have smoked or chewed Tobacco  in the last 2 yrs please stop smoking, stop any regular Alcohol  and or any Recreational drug use.  Wear Seat belts while driving.  Please note: You were cared for by a hospitalist during your hospital stay. Once you are discharged, your primary care physician will handle any further medical issues. Please note that NO REFILLS for any discharge medications will be authorized once you are discharged, as it is imperative that you return to your primary care physician (or establish a relationship with a primary care physician if you do not have one) for your post hospital discharge needs so that they can reassess your need for medications and monitor your lab values.     Seizure precautions: Per [facility] statutes, patients with seizures are not allowed to drive until they have been seizure-free for six months and cleared by a physician    Use caution when using heavy equipment or power tools. Avoid working on ladders or at heights. Take showers instead of baths. Ensure the water temperature is not too high on the home water heater. Do not go swimming alone. Do not lock yourself in a room alone (i.e. bathroom). When caring for infants or small children, sit down when holding, feeding, or changing them to minimize risk of injury to the child in the event you have a seizure. Maintain good sleep hygiene. Avoid alcohol.     If patient has another seizure, call 911 and bring them back to the ED if: A.  The seizure lasts longer than 5 minutes.      B.  The patient doesn't wake shortly after the seizure or has new problems such as difficulty seeing, speaking or moving following the seizure C.  The patient was injured during the seizure D.  The patient has a temperature over 102 F (39C) E.  The patient vomited during the seizure and now is having trouble breathing    During the Seizure   - First, ensure adequate ventilation and place patients on the floor on their left side  Loosen clothing around the neck and ensure the airway is patent. If the patient is clenching the teeth, do not force the mouth open with any object as this can cause severe damage - Remove all items from the surrounding that can be hazardous. The patient may be oblivious to what's happening and may not even know what he or she is doing. If the patient is confused and wandering, either gently guide him/her away and block access to outside areas - Reassure the individual and be comforting - Call 911. In most cases, the seizure ends before EMS arrives. However, there are cases when seizures may last over 3 to  5 minutes. Or the individual may have developed breathing difficulties or severe injuries. If a pregnant patient or a person with diabetes develops a seizure, it is prudent to call an ambulance. - Finally, if the patient does not regain full consciousness, then call EMS. Most patients will remain confused for about 45 to 90 minutes after a seizure, so you must use judgment in calling for help. - Avoid restraints but make sure the patient is in a bed with padded side rails - Place the individual in a lateral position with the neck slightly flexed; this will help the saliva drain from the mouth and prevent the tongue from falling backward - Remove all nearby furniture and other hazards from the area - Provide verbal assurance as the individual is  regaining consciousness - Provide the patient with privacy if possible - Call for help and start treatment as ordered by the caregiver    After the Seizure (Postictal Stage)   After a seizure, most patients experience confusion, fatigue, muscle pain and/or a headache. Thus, one should permit the individual to sleep. For the next few days, reassurance is essential. Being calm and helping reorient the person is also of importance.   Most seizures are painless and end spontaneously. Seizures are not harmful to others but can lead to complications such as stress on the lungs, brain and the heart. Individuals with prior lung problems may develop labored breathing and respiratory distress.    Increase activity slowly   Complete by: As directed       Allergies as of 04/28/2022       Reactions   Dalfampridine Other (See Comments)   Seizure 02/2021 while on dalfampridine        Medication List     TAKE these medications    amoxicillin-clavulanate 875-125 MG tablet Commonly known as: AUGMENTIN Take 1 tablet by mouth every 12 (twelve) hours for 3 days.   cholecalciferol 25 MCG (1000 UNIT) tablet Commonly known as: VITAMIN D3 Take 5,000 Units by mouth daily.   levETIRAcetam 1000 MG tablet Commonly known as: KEPPRA Take 1 tablet (1,000 mg total) by mouth 2 (two) times daily.   Ocrevus 300 MG/10ML injection Generic drug: ocrelizumab INFUSE 600MG  IN 500ML 0.9% NS INTRAVENOUSLY AT 40ML\HR AND INCREASE BY 40ML\HR EVERY 30 MINUTES (MAX 200ML\HR) OVER AT LEAST 3.5HRS AS DIRECTED EVERY 6 MONTHS.   zonisamide 100 MG capsule Commonly known as: ZONEGRAN Take 2 capsules (200 mg total) by mouth daily. What changed: how much to take        [place] Follow up.   Specialty: Rehabilitation Contact information: [address] [place], [place] [id number] [place] [id number] [phone]                Allergies  Allergen Reactions   Dalfampridine Other (See Comments)    Seizure 02/2021 while on dalfampridine     Other Procedures/Studies: EEG adult  Result Date: 05-17-22 Lora Havens, MD     05/17/2022  9:10 AM Patient Name: Miguel Black MRN: [id number] Epilepsy Attending: Lora Havens Referring Physician/Provider: Kerney Elbe, MD Date: 05/17/2022 Duration: 24.30 mins Patient history: 32 y.o. male with medical history significant of MS, seizures, debility. EEG to evaluate for seizure Level of alertness: Awake AEDs during EEG study: LEV, Ativan Technical aspects: This EEG study was done with scalp electrodes positioned according to the 10-20 International system of electrode placement. Electrical activity was  reviewed with band pass filter of 1-70Hz , sensitivity of 7 uV/mm, display speed of 74mm/sec with a 60Hz  notched filter applied as appropriate. EEG data were recorded continuously and digitally stored.  Video monitoring was available and reviewed as appropriate. Description: EEG showed continuous generalized 3 to 6 Hz theta-delta slowing admixed with 15 to 18 Hz beta activity  distributed symmetrically and diffusely. Hyperventilation and photic stimulation were not performed.    ABNORMALITY - Continuous slow, generalized - Excessive beta, generalized  IMPRESSION: This study is suggestive of severe diffuse encephalopathy, nonspecific etiology but likely related to sedation. No seizures or epileptiform discharges were seen throughout the recording.  Lora Havens   CT Head Wo Contrast  Result Date: 04/25/2022 CLINICAL DATA:  Seizure EXAM: CT HEAD WITHOUT CONTRAST TECHNIQUE: Contiguous axial images were obtained from the base of the skull through the vertex without intravenous contrast. RADIATION DOSE REDUCTION: This exam was performed according to the departmental dose-optimization program which includes automated exposure control, adjustment of the mA and/or kV according to  patient size and/or use of iterative reconstruction technique. COMPARISON:  MRI 01/26/2022, CT brain 11/14/2021 FINDINGS: Brain: No acute territorial infarction, hemorrhage or intracranial mass. Mild motion degradation. White matter hypodensity asymmetric to the left without significant change. Nonenlarged ventricles Vascular: No hyperdense vessels, no unexpected calcification Skull: Normal. Negative for fracture or focal lesion. Sinuses/Orbits: No acute finding. Other: None IMPRESSION: 1. Motion degraded exam without definite CT evidence for acute intracranial abnormality. 2. White matter disease without significant change Electronically Signed   By: Donavan Foil M.D.   On: 04/25/2022 21:22   DG Chest Portable 1 View  Result Date: 04/25/2022 CLINICAL DATA:  Altered mental status. EXAM: PORTABLE CHEST 1 VIEW COMPARISON:  Chest radiograph dated 11/14/2021. FINDINGS: Faint density in the left lower lung field, likely artifactual or atelectasis. Developing infiltrate is less likely. No consolidative changes. There is no pleural effusion pneumothorax. The cardiac silhouette is within normal limits. No acute osseous pathology. IMPRESSION: Faint density in the left lower lung field, likely artifactual or atelectasis. No consolidative changes. Electronically Signed   By: Anner Crete M.D.   On: 04/25/2022 19:50     TODAY-DAY OF DISCHARGE:  Subjective:   Attilio Trickett today has no headache,no chest abdominal pain,no new weakness tingling or numbness, feels much better wants to go home today.  Objective:   Blood pressure 115/67, pulse 91, temperature 98 F (36.7 C), temperature source Oral, resp. rate 18, SpO2 97 %.  Intake/Output Summary (Last 24 hours) at 04/28/2022 0639 Last data filed at 04/28/2022 0600 Gross per 24 hour  Intake 440 ml  Output 2625 ml  Net -2185 ml   There were no vitals filed for this visit.  Exam: Awake Alert, Oriented *3, No new F.N deficits, Normal  affect [place].AT,PERRAL Supple Neck,No JVD, No cervical lymphadenopathy appriciated.  Symmetrical Chest wall movement, Good air movement bilaterally, CTAB RRR,No Gallops,Rubs or new Murmurs, No Parasternal Heave +ve B.Sounds, Abd Soft, Non tender, No organomegaly appriciated, No rebound -guarding or rigidity. No Cyanosis, Clubbing or edema, No new Rash or bruise   PERTINENT RADIOLOGIC STUDIES: EEG adult  Result Date: 2022/05/26 Lora Havens, MD     05/26/2022  9:10 AM Patient Name: Miguel Black MRN: [id number] Epilepsy Attending: Lora Havens Referring Physician/Provider: Kerney Elbe, MD Date: May 26, 2022 Duration: 24.30 mins Patient history: 32 y.o. male with medical history significant of MS, seizures, debility. EEG to evaluate for seizure Level of alertness: Awake AEDs during EEG study:  LEV, Ativan Technical aspects: This EEG study was done with scalp electrodes positioned according to the 10-20 International system of electrode placement. Electrical activity was reviewed with band pass filter of 1-70Hz , sensitivity of 7 uV/mm, display speed of 32mm/sec with a 60Hz  notched filter applied as appropriate. EEG data were recorded continuously and digitally stored.  Video monitoring was available and reviewed as appropriate. Description: EEG showed continuous generalized 3 to 6 Hz theta-delta slowing admixed with 15 to 18 Hz beta activity  distributed symmetrically and diffusely. Hyperventilation and photic stimulation were not performed.    ABNORMALITY - Continuous slow, generalized - Excessive beta, generalized  IMPRESSION: This study is suggestive of severe diffuse encephalopathy, nonspecific etiology but likely related to sedation. No seizures or epileptiform discharges were seen throughout the recording.  Priyanka Barbra Sarks     PERTINENT LAB RESULTS: CBC: Recent Labs    04/26/22 0520 04/27/22 0955  WBC 15.3* 8.3  HGB 12.3* 13.5  HCT 37.8* 39.9  PLT 220 179   CMET CMP     Component  Value Date/Time   NA 136 04/27/2022 0955   NA 140 07/22/2019 1502   K 3.5 04/27/2022 0955   CL 101 04/27/2022 0955   CO2 22 04/27/2022 0955   GLUCOSE 99 04/27/2022 0955   BUN 9 04/27/2022 0955   BUN 12 07/22/2019 1502   CREATININE 0.93 04/27/2022 0955   CALCIUM 8.7 (L) 04/27/2022 0955   PROT 6.4 (L) 04/27/2022 0955   PROT 7.2 07/22/2019 1502   ALBUMIN 3.7 04/27/2022 0955   ALBUMIN 4.9 07/22/2019 1502   AST 42 (H) 04/27/2022 0955   ALT 56 (H) 04/27/2022 0955   ALKPHOS 65 04/27/2022 0955   BILITOT 0.8 04/27/2022 0955   BILITOT <0.2 07/22/2019 1502   GFRNONAA >60 04/27/2022 0955   GFRAA 99 07/22/2019 1502    GFR CrCl cannot be calculated (Unknown ideal weight.). No results for input(s): "LIPASE", "AMYLASE" in the last 72 hours. No results for input(s): "CKTOTAL", "CKMB", "CKMBINDEX", "TROPONINI" in the last 72 hours. Invalid input(s): "POCBNP" No results for input(s): "DDIMER" in the last 72 hours. No results for input(s): "HGBA1C" in the last 72 hours. No results for input(s): "CHOL", "HDL", "LDLCALC", "TRIG", "CHOLHDL", "LDLDIRECT" in the last 72 hours. No results for input(s): "TSH", "T4TOTAL", "T3FREE", "THYROIDAB" in the last 72 hours.  Invalid input(s): "FREET3" No results for input(s): "VITAMINB12", "FOLATE", "FERRITIN", "TIBC", "IRON", "RETICCTPCT" in the last 72 hours. Coags: No results for input(s): "INR" in the last 72 hours.  Invalid input(s): "PT" Microbiology: Recent Results (from the past 240 hour(s))  Resp panel by RT-PCR (RSV, Flu A&B, Covid) Anterior Nasal Swab     Status: None   Collection Time: 04/25/22  9:36 PM   Specimen: Anterior Nasal Swab  Result Value Ref Range Status   SARS Coronavirus 2 by RT PCR NEGATIVE NEGATIVE Final   Influenza A by PCR NEGATIVE NEGATIVE Final   Influenza B by PCR NEGATIVE NEGATIVE Final    Comment: (NOTE) The Xpert Xpress SARS-CoV-2/FLU/RSV plus assay is intended as an aid in the diagnosis of influenza from  Nasopharyngeal swab specimens and should not be used as a sole basis for treatment. Nasal washings and aspirates are unacceptable for Xpert Xpress SARS-CoV-2/FLU/RSV testing.  Fact Sheet for Patients: EntrepreneurPulse.com.au  Fact Sheet for Healthcare Providers: IncredibleEmployment.be  This test is not yet approved or cleared by the [place] FDA and has been authorized for detection and/or diagnosis of SARS-CoV-2 by FDA under an Emergency Use  Authorization (EUA). This EUA will remain in effect (meaning this test can be used) for the duration of the COVID-19 declaration under Section 564(b)(1) of the Act, 21 U.S.C. section 360bbb-3(b)(1), unless the authorization is terminated or revoked.     Resp Syncytial Virus by PCR NEGATIVE NEGATIVE Final    Comment: (NOTE) Fact Sheet for Patients: EntrepreneurPulse.com.au  Fact Sheet for Healthcare Providers: IncredibleEmployment.be  This test is not yet approved or cleared by the [place] FDA and has been authorized for detection and/or diagnosis of SARS-CoV-2 by FDA under an Emergency Use Authorization (EUA). This EUA will remain in effect (meaning this test can be used) for the duration of the COVID-19 declaration under Section 564(b)(1) of the Act, 21 U.S.C. section 360bbb-3(b)(1), unless the authorization is terminated or revoked.  Performed at [facility], [place] [address], [place], [place] [address]     FURTHER DISCHARGE INSTRUCTIONS:  Get Medicines reviewed and adjusted: Please take all your medications with you for your next visit with your Primary MD  Laboratory/radiological data: Please request your Primary MD to go over all hospital tests and procedure/radiological results at the follow up, please ask your Primary MD to get all Hospital records sent to his/her office.  In some cases, they will be blood work, cultures and biopsy  results pending at the time of your discharge. Please request that your primary care M.D. goes through all the records of your hospital data and follows up on these results.  Also Note the following: If you experience worsening of your admission symptoms, develop shortness of breath, life threatening emergency, suicidal or homicidal thoughts you must seek medical attention immediately by calling 911 or calling your MD immediately  if symptoms less severe.  You must read complete instructions/literature along with all the possible adverse reactions/side effects for all the Medicines you take and that have been prescribed to you. Take any new Medicines after you have completely understood and accpet all the possible adverse reactions/side effects.   Do not drive when taking Pain medications or sleeping medications (Benzodaizepines)  Do not take more than prescribed Pain, Sleep and Anxiety Medications. It is not advisable to combine anxiety,sleep and pain medications without talking with your primary care practitioner  Special Instructions: If you have smoked or chewed Tobacco  in the last 2 yrs please stop smoking, stop any regular Alcohol  and or any Recreational drug use.  Wear Seat belts while driving.  Please note: You were cared for by a hospitalist during your hospital stay. Once you are discharged, your primary care physician will handle any further medical issues. Please note that NO REFILLS for any discharge medications will be authorized once you are discharged, as it is imperative that you return to your primary care physician (or establish a relationship with a primary care physician if you do not have one) for your post hospital discharge needs so that they can reassess your need for medications and monitor your lab values.  Total Time spent coordinating discharge including counseling, education and face to face time equals greater than 30 minutes.  SignedOren Binet 04/28/2022 6:39 AM

## 2022-04-27 NOTE — Progress Notes (Signed)
Subjective: Continues to improve  Exam: Vitals:   04/26/22 1959 04/27/22 0400  BP: 131/73 129/80  Pulse: 64 64  Resp: 17 20  Temp: 99.6 F (37.6 C) 97.9 F (36.6 C)  SpO2: 94% 93%   Gen: In bed, NAD Resp: non-labored breathing, no acute distress Abd: soft, nt  Neuro: MS: He is awake, alert, interactive and appropriate, responses are slow but he is oriented. CN: Right exotropia, pupils reactive Motor: He is able to lift all extremities against gravity Sensory: Endorses symmetric sensation    Impression: 32 year old male with history of both MS and seizures who presented with prolonged postictal state following breakthrough seizure at home.  He appears to be tolerating the increased dose of Zonegran well.  Recommendations: 1) continue Keppra 1 g twice daily 2) continue Zonegran 200 mg twice daily  3) neurology will be available as needed. Roland Rack, MD [facility] [phone]  If 7pm- 7am, please page neurology on call as listed in [place].

## 2022-04-27 NOTE — Progress Notes (Addendum)
PROGRESS NOTE        PATIENT DETAILS Name: Miguel Black Age: 32 y.o. Sex: male Date of Birth: January 01, 1991 Admit Date: 04/25/2022 Admitting Physician Pratik Darleen Crocker, DO PCP:Pcp, No  Brief Summary: Patient is a 32 y.o.  male with history of MS, seizure disorder who confusion-thought to be due to a prolonged postictal state following a breakthrough seizure.  Significant events: 4/1>> admit to [facility]  Significant studies: 4/1>> CT head: No acute intracranial abnormality 4/1>> CXR: Faint density-likely artifactual.  No consolidative changes. 4/2>> Spot EEG: No seizures  Significant microbiology data: 4/1>> COVID/influenza/RSV PCR: Negative  Procedures: None  Consults: Neurology  Subjective: Lying comfortably in bed-denies any chest pain or shortness of breath.  Completely awake/alert.  Objective: Vitals: Blood pressure 129/80, pulse 64, temperature 97.9 F (36.6 C), temperature source Oral, resp. rate 20, SpO2 93 %.   Exam: Gen Exam:Alert awake-not in any distress HEENT:atraumatic, normocephalic Chest: B/L clear to auscultation anteriorly CVS:S1S2 regular Abdomen:soft non tender, non distended Extremities:no edema Neurology: Non focal-moving all 4 ext symmetrically. Skin: no rash  Pertinent Labs/Radiology:    Latest Ref Rng & Units 04/27/2022    9:55 AM 04/26/2022    5:20 AM 04/25/2022    7:40 PM  CBC  WBC 4.0 - 10.5 K/uL 8.3  15.3  17.6   Hemoglobin 13.0 - 17.0 g/dL 13.5  12.3  14.3   Hematocrit 39.0 - 52.0 % 39.9  37.8  43.1   Platelets 150 - 400 K/uL 179  220  237     Lab Results  Component Value Date   NA 136 04/27/2022   K 3.5 04/27/2022   CL 101 04/27/2022   CO2 22 04/27/2022    Assessment/Plan: Acute encephalopathy Likely due to prolonged postictal state from a breakthrough seizure Encephalopathy has resolved-completely awake/alert  Seizure disorder with breakthrough seizure Neurology following-EEG negative Continue  Keppra, Zonegran dosage increased  Possible aspiration pneumonia Afebrile-nontoxic-appearing-clinically improved Stop Rocephin/Zithromax-switch to Augmentin for a few more days.  Relapsing/meeting MS Per neurology-since clinically improved-no need to pursue MRI brain Not thought to have a flare  Debility/deconditioning PT recommending acute inpatient rehab  BMI: Estimated body mass index is 23.85 kg/m as calculated from the following:   Height as of 01/05/22: 5\' 9"  (1.753 m).   Weight as of 01/05/22: 73.3 kg.   Code status:   Code Status: Full Code   DVT Prophylaxis: enoxaparin (LOVENOX) injection 40 mg Start: 04/26/22 1400   Family Communication: [id number] updated 4/3-prefers outpatient rehab-than going to [facility] inpatient rehab as [facility] not in network   Disposition Plan: Status is: Inpatient Remains inpatient appropriate because: Severity of illness   Planned Discharge Destination:Rehabilitation facility   Diet: Diet Order             Diet regular Room service appropriate? Yes; Fluid consistency: Thin  Diet effective now                     Antimicrobial agents: Anti-infectives (From admission, onward)    Start     Dose/Rate Route Frequency Ordered Stop   04/26/22 2200  cefTRIAXone (ROCEPHIN) 2 g in sodium chloride 0.9 % 100 mL IVPB        2 g 200 mL/hr over 30 Minutes Intravenous Every 24 hours 04/26/22 0504 05/01/22 2159   04/26/22 2200  azithromycin (ZITHROMAX) 500 mg  in sodium chloride 0.9 % 250 mL IVPB        500 mg 250 mL/hr over 60 Minutes Intravenous Every 24 hours 04/26/22 0504 05/01/22 2159   04/25/22 2145  cefTRIAXone (ROCEPHIN) 1 g in sodium chloride 0.9 % 100 mL IVPB        1 g 200 mL/hr over 30 Minutes Intravenous  Once 04/25/22 2132 04/25/22 2158   04/25/22 2145  azithromycin (ZITHROMAX) 500 mg in sodium chloride 0.9 % 250 mL IVPB        500 mg 250 mL/hr over 60 Minutes Intravenous  Once 04/25/22 2132 04/25/22 2315         MEDICATIONS: Scheduled Meds:  acetaminophen  650 mg Oral Once   enoxaparin (LOVENOX) injection  40 mg Subcutaneous Q24H   zonisamide  200 mg Oral Daily   Continuous Infusions:  sodium chloride Stopped (04/26/22 0521)   azithromycin 500 mg (04/26/22 2243)   cefTRIAXone (ROCEPHIN)  IV 2 g (04/26/22 2251)   lactated ringers 100 mL/hr at 04/26/22 0907   levETIRAcetam 1,000 mg (04/27/22 1130)   PRN Meds:.acetaminophen   I have personally reviewed following labs and imaging studies  LABORATORY DATA: CBC: Recent Labs  Lab 04/25/22 1940 04/26/22 0520 04/27/22 0955  WBC 17.6* 15.3* 8.3  NEUTROABS 16.0*  --   --   HGB 14.3 12.3* 13.5  HCT 43.1 37.8* 39.9  MCV 97.5 98.2 95.0  PLT 237 220 0000000    Basic Metabolic Panel: Recent Labs  Lab 04/25/22 1940 04/26/22 0520 04/27/22 0955  NA 137 137 136  K 3.9 3.5 3.5  CL 100 104 101  CO2 25 25 22   GLUCOSE 114* 107* 99  BUN 5* 6 9  CREATININE 0.96 0.87 0.93  CALCIUM 8.7* 8.2* 8.7*  MG  --   --  1.9    GFR: CrCl cannot be calculated (Unknown ideal weight.).  Liver Function Tests: Recent Labs  Lab 04/25/22 1940 04/26/22 0520 04/27/22 0955  AST 62* 50* 42*  ALT 59* 56* 56*  ALKPHOS 82 65 65  BILITOT 0.5 0.4 0.8  PROT 6.7 5.7* 6.4*  ALBUMIN 3.9 3.2* 3.7   No results for input(s): "LIPASE", "AMYLASE" in the last 168 hours. No results for input(s): "AMMONIA" in the last 168 hours.  Coagulation Profile: No results for input(s): "INR", "PROTIME" in the last 168 hours.  Cardiac Enzymes: No results for input(s): "CKTOTAL", "CKMB", "CKMBINDEX", "TROPONINI" in the last 168 hours.  BNP (last 3 results) No results for input(s): "PROBNP" in the last 8760 hours.  Lipid Profile: No results for input(s): "CHOL", "HDL", "LDLCALC", "TRIG", "CHOLHDL", "LDLDIRECT" in the last 72 hours.  Thyroid Function Tests: No results for input(s): "TSH", "T4TOTAL", "FREET4", "T3FREE", "THYROIDAB" in the last 72 hours.  Anemia  Panel: No results for input(s): "VITAMINB12", "FOLATE", "FERRITIN", "TIBC", "IRON", "RETICCTPCT" in the last 72 hours.  Urine analysis:    Component Value Date/Time   COLORURINE YELLOW 04/26/2022 0150   APPEARANCEUR HAZY (A) 04/26/2022 0150   LABSPEC 1.027 04/26/2022 0150   PHURINE 7.0 04/26/2022 0150   GLUCOSEU NEGATIVE 04/26/2022 0150   HGBUR NEGATIVE 04/26/2022 0150   BILIRUBINUR NEGATIVE 04/26/2022 0150   KETONESUR NEGATIVE 04/26/2022 0150   PROTEINUR 30 (A) 04/26/2022 0150   NITRITE NEGATIVE 04/26/2022 0150   LEUKOCYTESUR NEGATIVE 04/26/2022 0150    Sepsis Labs: Lactic Acid, Venous    Component Value Date/Time   LATICACIDVEN 2.2 (HH) 12/04/2019 0720    MICROBIOLOGY: Recent Results (from the past 240 hour(s))  Resp panel by RT-PCR (RSV, Flu A&B, Covid) Anterior Nasal Swab     Status: None   Collection Time: 04/25/22  9:36 PM   Specimen: Anterior Nasal Swab  Result Value Ref Range Status   SARS Coronavirus 2 by RT PCR NEGATIVE NEGATIVE Final   Influenza A by PCR NEGATIVE NEGATIVE Final   Influenza B by PCR NEGATIVE NEGATIVE Final    Comment: (NOTE) The Xpert Xpress SARS-CoV-2/FLU/RSV plus assay is intended as an aid in the diagnosis of influenza from Nasopharyngeal swab specimens and should not be used as a sole basis for treatment. Nasal washings and aspirates are unacceptable for Xpert Xpress SARS-CoV-2/FLU/RSV testing.  Fact Sheet for Patients: EntrepreneurPulse.com.au  Fact Sheet for Healthcare Providers: IncredibleEmployment.be  This test is not yet approved or cleared by the [place] FDA and has been authorized for detection and/or diagnosis of SARS-CoV-2 by FDA under an Emergency Use Authorization (EUA). This EUA will remain in effect (meaning this test can be used) for the duration of the COVID-19 declaration under Section 564(b)(1) of the Act, 21 U.S.C. section 360bbb-3(b)(1), unless the authorization is  terminated or revoked.     Resp Syncytial Virus by PCR NEGATIVE NEGATIVE Final    Comment: (NOTE) Fact Sheet for Patients: EntrepreneurPulse.com.au  Fact Sheet for Healthcare Providers: IncredibleEmployment.be  This test is not yet approved or cleared by the [place] FDA and has been authorized for detection and/or diagnosis of SARS-CoV-2 by FDA under an Emergency Use Authorization (EUA). This EUA will remain in effect (meaning this test can be used) for the duration of the COVID-19 declaration under Section 564(b)(1) of the Act, 21 U.S.C. section 360bbb-3(b)(1), unless the authorization is terminated or revoked.  Performed at [facility], [place] [address], [place], [place] [address]     RADIOLOGY STUDIES/RESULTS: EEG adult  Result Date: 04/26/2022 Lora Havens, MD     04/26/2022  9:10 AM Patient Name: Miguel Black MRN: [id number] Epilepsy Attending: Lora Havens Referring Physician/Provider: Kerney Elbe, MD Date: 04/26/2022 Duration: 24.30 mins Patient history: 32 y.o. male with medical history significant of MS, seizures, debility. EEG to evaluate for seizure Level of alertness: Awake AEDs during EEG study: LEV, Ativan Technical aspects: This EEG study was done with scalp electrodes positioned according to the 10-20 International system of electrode placement. Electrical activity was reviewed with band pass filter of 1-70Hz , sensitivity of 7 uV/mm, display speed of 3mm/sec with a 60Hz  notched filter applied as appropriate. EEG data were recorded continuously and digitally stored.  Video monitoring was available and reviewed as appropriate. Description: EEG showed continuous generalized 3 to 6 Hz theta-delta slowing admixed with 15 to 18 Hz beta activity  distributed symmetrically and diffusely. Hyperventilation and photic stimulation were not performed.    ABNORMALITY - Continuous slow, generalized - Excessive beta, generalized   IMPRESSION: This study is suggestive of severe diffuse encephalopathy, nonspecific etiology but likely related to sedation. No seizures or epileptiform discharges were seen throughout the recording.  Lora Havens   CT Head Wo Contrast  Result Date: 04/25/2022 CLINICAL DATA:  Seizure EXAM: CT HEAD WITHOUT CONTRAST TECHNIQUE: Contiguous axial images were obtained from the base of the skull through the vertex without intravenous contrast. RADIATION DOSE REDUCTION: This exam was performed according to the departmental dose-optimization program which includes automated exposure control, adjustment of the mA and/or kV according to patient size and/or use of iterative reconstruction technique. COMPARISON:  MRI 01/26/2022, CT brain 11/14/2021 FINDINGS: Brain: No acute  territorial infarction, hemorrhage or intracranial mass. Mild motion degradation. White matter hypodensity asymmetric to the left without significant change. Nonenlarged ventricles Vascular: No hyperdense vessels, no unexpected calcification Skull: Normal. Negative for fracture or focal lesion. Sinuses/Orbits: No acute finding. Other: None IMPRESSION: 1. Motion degraded exam without definite CT evidence for acute intracranial abnormality. 2. White matter disease without significant change Electronically Signed   By: Donavan Foil M.D.   On: 04/25/2022 21:22   DG Chest Portable 1 View  Result Date: 04/25/2022 CLINICAL DATA:  Altered mental status. EXAM: PORTABLE CHEST 1 VIEW COMPARISON:  Chest radiograph dated 11/14/2021. FINDINGS: Faint density in the left lower lung field, likely artifactual or atelectasis. Developing infiltrate is less likely. No consolidative changes. There is no pleural effusion pneumothorax. The cardiac silhouette is within normal limits. No acute osseous pathology. IMPRESSION: Faint density in the left lower lung field, likely artifactual or atelectasis. No consolidative changes. Electronically Signed   By: Anner Crete  M.D.   On: 04/25/2022 19:50     LOS: 1 day   Oren Binet, MD  [facility]    To contact the attending provider between 7A-7P or the covering provider during after hours 7P-7A, please log into the web site [email or web address] and access using universal Enterprise password for that web site. If you do not have the password, please call the hospital operator.  04/27/2022, 11:44 AM

## 2022-04-27 NOTE — Progress Notes (Signed)
Physical Therapy Treatment Patient Details Name: Miguel Black MRN: [id number] DOB: 18-Oct-1990 Today's Date: 04/27/2022   History of Present Illness Pt is a 32 y/o male admitted 4/1 after seizure activity at home. PMH includes relapsing remitting MS and seizures, satus epilepticus 2020.    PT Comments    Pt progressing well, able to ambulate in the hallway today but requires chair follow and increased cueing for safety as pt has diminished insight into his deficits. Pt is very motivated to work with therapy and mobilize, requiring min-modA for physical assistance and maximal cueing for safety with ambulation for RW management and technique. Pt often ambulating to the side of his RW or behind it at an increased distance, cueing throughout for proximity as well as knee and hip flexion to advance BLE, pt often circumducting to advance. Acute PT will continue to follow up with pt to progress mobility, discharge recommendations remain appropriate.     Recommendations for follow up therapy are one component of a multi-disciplinary discharge planning process, led by the attending physician.  Recommendations may be updated based on patient status, additional functional criteria and insurance authorization.  Follow Up Recommendations       Assistance Recommended at Discharge Frequent or constant Supervision/Assistance  Patient can return home with the following A lot of help with bathing/dressing/bathroom;Assistance with cooking/housework;Direct supervision/assist for financial management;Direct supervision/assist for medications management;Assist for transportation;Help with stairs or ramp for entrance;Two people to help with walking and/or transfers   Equipment Recommendations  None recommended by PT    Recommendations for Other Services Rehab consult     Precautions / Restrictions Precautions Precautions: Fall;Other (comment) (seizure) Restrictions Weight Bearing Restrictions: No      Mobility  Bed Mobility Overal bed mobility: Needs Assistance Bed Mobility: Supine to Sit, Sit to Supine     Supine to sit: Min guard, HOB elevated Sit to supine: Min guard, HOB elevated   General bed mobility comments: minG for safety as pt is impulsive, verbal cues for sequencing    Transfers Overall transfer level: Needs assistance Equipment used: Rolling walker (2 wheels) Transfers: Sit to/from Stand, Bed to chair/wheelchair/BSC Sit to Stand: Min guard, Min assist, +2 safety/equipment   Step pivot transfers: Min assist, +2 safety/equipment       General transfer comment: minA from bed for first trial, progressing to minG for second trial, cueing for hand placement. +2 and max cueing for safety. Step pivot back to bed as no chair alarm available and pt impulsive    Ambulation/Gait Ambulation/Gait assistance: Max assist, +2 safety/equipment Gait Distance (Feet): 40 Feet Assistive device: Rolling walker (2 wheels) Gait Pattern/deviations: Step-to pattern, Decreased stride length, Wide base of support, Drifts right/left, Decreased step length - left, Decreased step length - right, Decreased dorsiflexion - left, Decreased dorsiflexion - right, Ataxic Gait velocity: decreased     General Gait Details: ambulating with modA but requiring maximal cueing for RW management and knee and hip flexion B. Pt with decreased insight into his deficits requiring +2 for chair follow for safety. B knee extension throughout most of gait, especially on L, pt circumducting to advance legs with decreased L foot clearance.   Stairs             Wheelchair Mobility    Modified Rankin (Stroke Patients Only)       Balance Overall balance assessment: Needs assistance Sitting-balance support: No upper extremity supported, Feet supported Sitting balance-Leahy Scale: Fair   Postural control: Posterior lean, Right lateral lean  Standing balance support: Bilateral upper extremity supported,  During functional activity, Reliant on assistive device for balance Standing balance-Leahy Scale: Poor Standing balance comment: reliant on external support for balance                            Cognition Arousal/Alertness: Awake/alert Behavior During Therapy: Impulsive Overall Cognitive Status: Impaired/Different from baseline Area of Impairment: Orientation, Memory, Following commands, Safety/judgement, Problem solving                 Orientation Level: Disoriented to, Time   Memory: Decreased short-term memory Following Commands: Follows one step commands with increased time Safety/Judgement: Decreased awareness of safety, Decreased awareness of deficits   Problem Solving: Slow processing, Requires verbal cues, Difficulty sequencing General Comments: Increased time for command following and increased cueing provided for safety        Exercises      General Comments General comments (skin integrity, edema, etc.): VSS on room air      Pertinent Vitals/Pain Pain Assessment Pain Assessment: No/denies pain    Home Living                          Prior Function            PT Goals (current goals can now be found in the care plan section) Acute Rehab PT Goals Patient Stated Goal: Get better, go home PT Goal Formulation: With patient/family Time For Goal Achievement: 05/10/22 Potential to Achieve Goals: Good Progress towards PT goals: Progressing toward goals    Frequency    Min 4X/week      PT Plan Current plan remains appropriate    Co-evaluation PT/OT/SLP Co-Evaluation/Treatment: Yes Reason for Co-Treatment: Necessary to address cognition/behavior during functional activity;For patient/therapist safety;To address functional/ADL transfers PT goals addressed during session: Mobility/safety with mobility;Proper use of DME        AM-PAC PT "6 Clicks" Mobility   Outcome Measure  Help needed turning from your back to your side  while in a flat bed without using bedrails?: A Little Help needed moving from lying on your back to sitting on the side of a flat bed without using bedrails?: A Little Help needed moving to and from a bed to a chair (including a wheelchair)?: A Lot Help needed standing up from a chair using your arms (e.g., wheelchair or bedside chair)?: A Lot Help needed to walk in hospital room?: Total Help needed climbing 3-5 steps with a railing? : Total 6 Click Score: 12    End of Session Equipment Utilized During Treatment: Gait belt Activity Tolerance: Patient tolerated treatment well Patient left: in bed;with call bell/phone within reach;with bed alarm set Nurse Communication: Mobility status PT Visit Diagnosis: Unsteadiness on feet (R26.81);Other abnormalities of gait and mobility (R26.89);Muscle weakness (generalized) (M62.81);History of falling (Z91.81);Difficulty in walking, not elsewhere classified (R26.2);Other symptoms and signs involving the nervous system (R29.898);Hemiplegia and hemiparesis Hemiplegia - Right/Left: Left Hemiplegia - dominant/non-dominant: Dominant Hemiplegia - caused by:  (Hx of MS, some baseline weakness on Lt.)     Time: [id number] PT Time Calculation (min) (ACUTE ONLY): 31 min  Charges:  $Gait Training: 8-22 mins                     Charlynne Cousins, PT DPT Acute Rehabilitation Services Office [phone]    Luvenia Heller 04/27/2022, 3:55 PM

## 2022-04-27 NOTE — TOC Initial Note (Signed)
Transition of Care [facility]) - Initial/Assessment Note    Patient Details  Name: Miguel Black MRN: [id number] Date of Birth: [birth year]  [facility] CM/SW Contact:    Benard Halsted, LCSW Phone Number: 04/27/2022, 1:05 PM  Clinical Narrative:                 CSW spoke with patient regarding inpatient rehab recommendation. CSW explained that patient's insurance is not in network with CIR. CSW provided other location options of [place] and [place]. Per patient, he lives in [place] with his parents and brother so [place] would be the next closest. CSW sent referral to [place] with [facility] for review.   Expected Discharge Plan: IP Rehab Facility Barriers to Discharge: Insurance Authorization (IP rehab bed)   Patient Goals and CMS Choice Patient states their goals for this hospitalization and ongoing recovery are:: Rehab CMS Medicare.gov Compare Post Acute Care list provided to:: Patient Choice offered to / list presented to : Patient [place] ownership interest in [facility].provided to:: Patient    Expected Discharge Plan and Services     Post Acute Care Choice: IP Rehab Living arrangements for the past 2 months: Single Family Home                                      Prior Living Arrangements/Services Living arrangements for the past 2 months: Single Family Home Lives with:: Parents Patient language and need for interpreter reviewed:: Yes Do you feel safe going back to the place where you live?: Yes      Need for Family Participation in Patient Care: Yes (Comment) Care giver support system in place?: Yes (comment) Current home services: DME (cane) Criminal Activity/Legal Involvement Pertinent to Current Situation/Hospitalization: No - Comment as needed  Activities of Daily Living      Permission Sought/Granted Permission sought to share information with : Facility Sport and exercise psychologist, Family Supports Permission granted to  share information with : Yes, Verbal Permission Granted     Permission granted to share info w AGENCY: IR  Permission granted to share info w Relationship: Mother     Emotional Assessment Appearance:: Appears stated age Attitude/Demeanor/Rapport: Engaged Affect (typically observed): Accepting, Appropriate, Pleasant Orientation: : Oriented to Self, Oriented to Place, Oriented to  Time, Oriented to Situation Alcohol / Substance Use: Not Applicable Psych Involvement: No (comment)  Admission diagnosis:  Acute encephalopathy [G93.40] Altered mental status, unspecified altered mental status type [id number] Acute metabolic encephalopathy [id number] Patient Active Problem List   Diagnosis Date Noted   Acute encephalopathy 04/26/2022   Neurological deficit present [id number]   Acute metabolic encephalopathy [id number]   Acute respiratory failure with hypoxia 08/23/2021   Status epilepticus 08/21/2021   History of optic neuritis 07/29/2020   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   High risk medication use 01/28/2020   Seizure disorder 12/04/2019   Marijuana use 12/04/2019   Seizures 01/22/2019   Gait disturbance 01/22/2019   Left-sided weakness 01/22/2019   Other fatigue 01/22/2019   Aspiration pneumonia 07/12/2018   Relapsing remitting multiple sclerosis 07/12/2018   Encounter for screening for HIV 07/12/2018   PCP:  Pcp, No Pharmacy:   [id number] - [place], [place] [address] Phone: [phone] Fax: [phone]  [place], [address]  [place] [address] Phone: [phone] Fax: [phone]     Social Determinants of Health (SDOH) Social History: SDOH Screenings   Depression (PHQ2-9): Low Risk  (01/15/2020)  Tobacco Use: High Risk (01/19/2022)   SDOH Interventions:     Readmission Risk Interventions     No data to  display

## 2022-04-27 NOTE — Plan of Care (Signed)

## 2022-04-27 NOTE — Progress Notes (Signed)
Speech Language Pathology Treatment: Dysphagia  Patient Details Name: Miguel Black MRN: [id number] DOB: [birth year] Today's Date: 04/27/2022 Time: 1000-1015 SLP Time Calculation (min) (ACUTE ONLY): 15 min  Assessment / Plan / Recommendation Clinical Impression  Patient seen by SLP for skilled treatment focused on dysphagia goals. Patient was awake and alert and appears much improved as compared to previous date. Today, he is able to converse with SLP and not exhibiting the delays in responses as he had. He does continue to exhibit impaired cognition, such as thinking the red button on phone was the button to call his nurse. In addition, during session patient's mother called and patient then told SLP that she was coming to pick him up. (No d/c plans for patient yet). He did demonstrate functional swallow, with no initiation delays with thin liquids (water) as was observed during evaluation previous date. SLP recommending to advance to regular solids and no further skilled intervention warranted.   Patient may benefit from cognitive evaluation at next venue of care if he is not at baseline.    HPI HPI: Patient is a 32 y.o. male with PMH: MS, seizures, debility. He presented to the hospital from home on 04/26/22 after family found him to be altered and unable to follow commands, not talking. On arrival to ED, patient with AMS and was admitted with acute metabolic encephalopathy likely in setting of PNA (aspiration PNA) versus other neurologic concerns such as MS flare, seizure activity of stroke. CT head was motion degraded without definitive evidence for acute intracranial abnormality. MRI brain pending.      SLP Plan  All goals met;Discharge SLP treatment due to (comment)      Recommendations for follow up therapy are one component of a multi-disciplinary discharge planning process, led by the attending physician.  Recommendations may be updated based on patient status, additional functional  criteria and insurance authorization.    Recommendations  Diet recommendations: Regular;Thin liquid Liquids provided via: Cup;Straw Medication Administration: Whole meds with liquid Supervision: Patient able to self feed Compensations: Slow rate;Small sips/bites;Minimize environmental distractions Postural Changes and/or Swallow Maneuvers: Seated upright 90 degrees                  Oral care BID   Set up Supervision/Assistance Dysphagia, unspecified (R13.10)     All goals met;Discharge SLP treatment due to (comment)     Sonia Baller, MA, CCC-SLP Speech Therapy

## 2022-04-27 NOTE — TOC Transition Note (Signed)
Transition of Care [facility]) - CM/SW Discharge Note   Patient Details  Name: Miguel Black MRN: [id number] Date of Birth: [birth year]  [facility] of [facility]) CM/SW Contact:  Levonne Lapping, RN Phone Number: 04/27/2022, 2:46 PM   Clinical Narrative:    Patient will DC to home with Mom. She will pick her Son up at 8:30 in the am.  Per Mom and Patient's request, Patient will attend OP Rehab at [facility].  Referral has been made.  No additional CM needs at this time.          Barriers to Discharge: Insurance Authorization (IP rehab bed)   Patient Goals and CMS Choice CMS Medicare.gov Compare Post Acute Care list provided to:: Patient Choice offered to / list presented to : Patient  Discharge Placement                         Discharge Plan and Services Additional resources added to the After Visit Summary for       Post Acute Care Choice: IP Rehab                               Social Determinants of Health (SDOH) Interventions SDOH Screenings   Depression (PHQ2-9): Low Risk  (01/15/2020)  Tobacco Use: High Risk (01/19/2022)     Readmission Risk Interventions     No data to display

## 2022-04-27 NOTE — Evaluation (Signed)
Occupational Therapy Evaluation Patient Details Name: Miguel Black MRN: [id number] DOB: 25-Jan-1991 Today's Date: 04/27/2022   History of Present Illness Pt is a 32 y/o male admitted 4/1 after seizure activity at home. PMH includes relapsing remitting MS and seizures, satus epilepticus 2020.   Clinical Impression   Pt admitted with above diagnosis. Lives at home with parents and adult sibling with assistance available 24/7. Pt was previously Independent with ADLs and used quad cane for functional mobility in the community. Pt now presents with cognitive and functional decline as outlined below. Pt requires Mod assist with UB ADLs and Max assist with LB ADLs with max cues for safety and sequencing throughout. Pt is motivated to participate in OT. Pt will benefit from acute skilled OT to increase safety and independence with ADLs. Post acute discharge, pt would benefit from intensive inpatient rehab >3 hours per day.     Recommendations for follow up therapy are one component of a multi-disciplinary discharge planning process, led by the attending physician.  Recommendations may be updated based on patient status, additional functional criteria and insurance authorization.   Assistance Recommended at Discharge Frequent or constant Supervision/Assistance  Patient can return home with the following A little help with walking and/or transfers;A little help with bathing/dressing/bathroom;Assistance with cooking/housework;Direct supervision/assist for medications management;Direct supervision/assist for financial management;Assist for transportation;Help with stairs or ramp for entrance    Functional Status Assessment  Patient has had a recent decline in their functional status and demonstrates the ability to make significant improvements in function in a reasonable and predictable amount of time.  Equipment Recommendations  None recommended by OT    Recommendations for Other Services Rehab  consult     Precautions / Restrictions Precautions Precautions: Fall;Other (comment) (Seizure)      Mobility Bed Mobility Overal bed mobility: Needs Assistance Bed Mobility: Supine to Sit, Sit to Supine     Supine to sit: Min guard Sit to supine: Min guard   General bed mobility comments: Min guard for safety; Required Mod assist with cues for sequencing and hand/foot placement for positioning in bed after sit to supine    Transfers Overall transfer level: Needs assistance Equipment used: Rolling walker (2 wheels) Transfers: Sit to/from Stand Sit to Stand: Min guard, +2 safety/equipment (with cues for safety and sequencing)                  Balance Overall balance assessment: Needs assistance Sitting-balance support: No upper extremity supported, Feet supported Sitting balance-Leahy Scale: Fair   Postural control: Posterior lean, Right lateral lean Standing balance support: Bilateral upper extremity supported, During functional activity, Reliant on assistive device for balance Standing balance-Leahy Scale: Poor                             ADL either performed or assessed with clinical judgement   ADL Overall ADL's : Needs assistance/impaired Eating/Feeding: Modified independent   Grooming: Cueing for sequencing;Set up;Sitting;Bed level   Upper Body Bathing: Sitting;Cueing for safety;Cueing for sequencing;Moderate assistance   Lower Body Bathing: Cueing for safety;Cueing for sequencing;Sit to/from stand;Maximal assistance;+2 for safety/equipment   Upper Body Dressing : Cueing for sequencing;Sitting;Moderate assistance   Lower Body Dressing: Cueing for safety;Cueing for sequencing;Sit to/from stand;Maximal assistance   Toilet Transfer: Moderate assistance;+2 for safety/equipment;Rolling walker (2 wheels);BSC/3in1;Cueing for safety;Cueing for sequencing   Toileting- Clothing Manipulation and Hygiene: Maximal assistance;Cueing for safety;Cueing for  sequencing;+2 for safety/equipment   Tub/ Shower Transfer:  Moderate assistance;+2 for safety/equipment;Cueing for safety;Cueing for sequencing;Shower seat;Rolling walker (2 wheels);Grab bars   Functional mobility during ADLs: Moderate assistance;Cueing for sequencing;Cueing for safety;+2 for safety/equipment;Rolling walker (2 wheels) (Pt required cues to open his eyes/attend to surroundings during functional mobility.)       Vision Baseline Vision/History: 0 No visual deficits Vision Assessment?: No apparent visual deficits     Perception     Praxis Praxis Praxis tested?: Deficits; Decreased sequencing and motor planning    Pertinent Vitals/Pain Pain Assessment Pain Assessment: No/denies pain     Hand Dominance Right (Pt reports he uses Right hand for eating and writing and Left hand for all other tasks.)   Extremity/Trunk Assessment Upper Extremity Assessment Upper Extremity Assessment: Overall WFL for tasks assessed   Lower Extremity Assessment Lower Extremity Assessment: Defer to PT evaluation       Communication Communication Communication: No difficulties   Cognition Arousal/Alertness: Awake/alert Behavior During Therapy: Impulsive, WFL for tasks assessed/performed (Motivated to participate) Overall Cognitive Status: Impaired/Different from baseline (Per chart review, pt with recent decrease in cognition. No family present during OT eval.) Area of Impairment: Orientation, Memory, Following commands, Safety/judgement, Problem solving                 Orientation Level: Disoriented to, Time (Oriented to year; disoriented to month and day)   Memory: Decreased short-term memory Following Commands: Follows one step commands consistently, Follows one step commands with increased time Safety/Judgement: Decreased awareness of safety, Decreased awareness of deficits   Problem Solving: Slow processing, Requires verbal cues, Difficulty sequencing General Comments:  Following commands consistently, delayed processing.     General Comments       Exercises     Shoulder Instructions      Home Living Family/patient expects to be discharged to:: Private residence Living Arrangements: Parent;Other relatives (Pt lives with parents and older brother.) Available Help at Discharge: Available 24 hours/day;Family Type of Home: Apartment Home Access: Stairs to enter Technical brewer of Steps: 5 Entrance Stairs-Rails: Right;Left;Can reach both Home Layout: Two level Alternate Level Stairs-Number of Steps: flight Alternate Level Stairs-Rails: Left Bathroom Shower/Tub: Occupational psychologist: Standard     Home Equipment: Shower seat;Cane - Holiday representative (2 wheels);Grab bars - tub/shower          Prior Functioning/Environment Prior Level of Function : Independent/Modified Independent             Mobility Comments: Pt reports using quad cane for ambulation outdoors, no device indoors ADLs Comments: Independent        OT Problem List: Impaired balance (sitting and/or standing);Decreased coordination;Decreased cognition;Decreased safety awareness      OT Treatment/Interventions: Self-care/ADL training;Therapeutic activities;Cognitive remediation/compensation;Patient/family education;Balance training;DME and/or AE instruction    OT Goals(Current goals can be found in the care plan section) Acute Rehab OT Goals Patient Stated Goal: Pt wants to return home with family. OT Goal Formulation: With patient Time For Goal Achievement: 05/11/22 Potential to Achieve Goals: Good ADL Goals Pt Will Perform Upper Body Bathing: with min guard assist;sitting Pt Will Perform Lower Body Bathing: sit to/from stand;with min assist Pt Will Perform Upper Body Dressing: with supervision;sitting Pt Will Transfer to Toilet: with min assist Pt Will Perform Toileting - Clothing Manipulation and hygiene: with min assist;sit to/from stand  OT  Frequency: Min 2X/week    Co-evaluation PT/OT/SLP Co-Evaluation/Treatment: Yes Reason for Co-Treatment: Necessary to address cognition/behavior during functional activity;For patient/therapist safety;To address functional/ADL transfers   OT goals addressed during session: ADL's  and self-care;Proper use of Adaptive equipment and DME      AM-PAC OT "6 Clicks" Daily Activity     Outcome Measure Help from another person eating meals?: None Help from another person taking care of personal grooming?: A Little Help from another person toileting, which includes using toliet, bedpan, or urinal?: A Lot Help from another person bathing (including washing, rinsing, drying)?: A Lot Help from another person to put on and taking off regular upper body clothing?: A Lot Help from another person to put on and taking off regular lower body clothing?: A Lot 6 Click Score: 15   End of Session Equipment Utilized During Treatment: Gait belt;Rolling walker (2 wheels) Nurse Communication: Mobility status;Other (comment) (Decreased safety awareness)  Activity Tolerance: Patient tolerated treatment well Patient left: in bed;with call bell/phone within reach;with bed alarm set  OT Visit Diagnosis: Unsteadiness on feet (R26.81);History of falling (Z91.81);Other symptoms and signs involving cognitive function                Time: [id number] OT Time Calculation (min): 35 min Charges:  OT General Charges $OT Visit: 1 Visit OT Evaluation $OT Eval Moderate Complexity: 1 Mod  [address] MA, OTR/L   Andres Ege 04/27/2022, 1:19 PM

## 2022-04-28 DIAGNOSIS — G9341 Metabolic encephalopathy: Secondary | ICD-10-CM | POA: Diagnosis not present

## 2022-04-28 DIAGNOSIS — G9340 Encephalopathy, unspecified: Secondary | ICD-10-CM | POA: Diagnosis not present

## 2022-04-28 DIAGNOSIS — J690 Pneumonitis due to inhalation of food and vomit: Secondary | ICD-10-CM | POA: Diagnosis not present

## 2022-04-28 LAB — LEVETIRACETAM LEVEL: Levetiracetam Lvl: <2 ug/mL — ABNORMAL LOW (ref 10.0–40.0)

## 2022-04-28 NOTE — Progress Notes (Signed)
Physical Therapy Treatment Patient Details Name: Miguel Black MRN: [id number] DOB: 12-Jun-1990 Today's Date: 04/28/2022   History of Present Illness Pt is a 32 y/o male admitted 4/1 after seizure activity at home. PMH includes relapsing remitting MS and seizures, satus epilepticus 2020.    PT Comments    Pt tolerated today's session well, noted good progression with mobility. Pt performing sit<>stand trials with minG and use of RW, consistent cueing required for pushing up from the bed as pt wanting to utilize RW. Pt maintaining proper proximity to RW and gait pattern improved, performed a trial of standing marches to emphasize increased B foot clearance, slight carryover noted to ambulation but decreased clearance with fatigue. Acute PT will continue to follow up with pt as appropriate.     Recommendations for follow up therapy are one component of a multi-disciplinary discharge planning process, led by the attending physician.  Recommendations may be updated based on patient status, additional functional criteria and insurance authorization.  Follow Up Recommendations       Assistance Recommended at Discharge Frequent or constant Supervision/Assistance  Patient can return home with the following A lot of help with bathing/dressing/bathroom;Assistance with cooking/housework;Direct supervision/assist for financial management;Direct supervision/assist for medications management;Assist for transportation;Help with stairs or ramp for entrance;A little help with walking and/or transfers   Equipment Recommendations  None recommended by PT    Recommendations for Other Services Rehab consult     Precautions / Restrictions Precautions Precautions: Fall;Other (comment) (seizure) Restrictions Weight Bearing Restrictions: No     Mobility  Bed Mobility               General bed mobility comments: pt seated EOB upon arrival, ended session with pt sitting in chair     Transfers Overall transfer level: Needs assistance Equipment used: Rolling walker (2 wheels) Transfers: Sit to/from Stand Sit to Stand: Min guard           General transfer comment: minG to stand x5 reps with cueing for proper hand placement each rep. Let pt attempt to stand with hands on RW and unable to complete, but standing with minG with both hands pushing from the bed    Ambulation/Gait Ambulation/Gait assistance: Min assist Gait Distance (Feet): 20 Feet (20 feet x3 reps, 60 feet x1 rep. Seated rest break between each trial) Assistive device: Rolling walker (2 wheels) Gait Pattern/deviations: Decreased stride length, Wide base of support, Drifts right/left, Decreased step length - left, Decreased step length - right, Decreased dorsiflexion - left, Decreased dorsiflexion - right, Ataxic, Step-through pattern Gait velocity: decreased     General Gait Details: pt with improved use of RW and maintaining proper proximity and staying within its parameters. Cued for increase foot clearance B and noted improved knee and hip flexion for swing through phase   Stairs             Wheelchair Mobility    Modified Rankin (Stroke Patients Only)       Balance Overall balance assessment: Needs assistance Sitting-balance support: No upper extremity supported, Feet supported Sitting balance-Leahy Scale: Fair     Standing balance support: Bilateral upper extremity supported, During functional activity, Reliant on assistive device for balance Standing balance-Leahy Scale: Poor Standing balance comment: reliant on external support for balance                            Cognition Arousal/Alertness: Awake/alert Behavior During Therapy: Impulsive Overall Cognitive Status: Impaired/Different  from baseline Area of Impairment: Memory, Following commands, Safety/judgement, Problem solving                     Memory: Decreased short-term memory Following  Commands: Follows one step commands with increased time Safety/Judgement: Decreased awareness of safety, Decreased awareness of deficits   Problem Solving: Slow processing, Requires verbal cues General Comments: Improved command following and impulsivity from last session but requires cues with each rep for proper technique        Exercises General Exercises - Lower Extremity Hip Flexion/Marching: AROM, Both, 10 reps, Standing    General Comments General comments (skin integrity, edema, etc.): VSS on room air      Pertinent Vitals/Pain Pain Assessment Pain Assessment: No/denies pain    Home Living                          Prior Function            PT Goals (current goals can now be found in the care plan section) Acute Rehab PT Goals Patient Stated Goal: Get better, go home PT Goal Formulation: With patient/family Time For Goal Achievement: 05/10/22 Potential to Achieve Goals: Good Progress towards PT goals: Progressing toward goals    Frequency    Min 4X/week      PT Plan Current plan remains appropriate    Co-evaluation              AM-PAC PT "6 Clicks" Mobility   Outcome Measure  Help needed turning from your back to your side while in a flat bed without using bedrails?: A Little Help needed moving from lying on your back to sitting on the side of a flat bed without using bedrails?: A Little Help needed moving to and from a bed to a chair (including a wheelchair)?: A Little Help needed standing up from a chair using your arms (e.g., wheelchair or bedside chair)?: A Little Help needed to walk in hospital room?: A Little Help needed climbing 3-5 steps with a railing? : Total 6 Click Score: 16    End of Session Equipment Utilized During Treatment: Gait belt Activity Tolerance: Patient tolerated treatment well Patient left: with call bell/phone within reach;in chair;with chair alarm set Nurse Communication: Mobility status PT Visit  Diagnosis: Unsteadiness on feet (R26.81);Other abnormalities of gait and mobility (R26.89);Muscle weakness (generalized) (M62.81);History of falling (Z91.81);Difficulty in walking, not elsewhere classified (R26.2);Other symptoms and signs involving the nervous system (R29.898);Hemiplegia and hemiparesis Hemiplegia - Right/Left: Left Hemiplegia - dominant/non-dominant: Dominant Hemiplegia - caused by:  (Hx of MS, some baseline weakness on Lt.)     Time: [id number] PT Time Calculation (min) (ACUTE ONLY): 23 min  Charges:  $Gait Training: 8-22 mins $Therapeutic Activity: 8-22 mins                     Charlynne Cousins, PT DPT Acute Rehabilitation Services Office [phone]    Luvenia Heller 04/28/2022, 3:09 PM

## 2022-05-16 ENCOUNTER — Ambulatory Visit: Payer: Medicare (Managed Care) | Attending: Neurology

## 2022-05-16 DIAGNOSIS — M6281 Muscle weakness (generalized): Secondary | ICD-10-CM

## 2022-05-16 DIAGNOSIS — R2681 Unsteadiness on feet: Secondary | ICD-10-CM

## 2022-05-16 NOTE — Therapy (Signed)
OUTPATIENT PHYSICAL THERAPY NEURO EVALUATION   Patient Name: Miguel Black MRN: [id number] DOB:[birth year], 32 y.o., male Today's Date: 05/16/2022   PCP: none listed REFERRING PROVIDER: Asa Lente, MD  END OF SESSION:  PT End of Session - 05/16/22 1313     Visit Number 1    Number of Visits 9    Date for PT Re-Evaluation 06/27/22    Authorization Type [employer] Advantage; Medicaid secondary    Authorization Time Period auth required    PT Start Time 1315    PT Stop Time 1400    PT Time Calculation (min) 45 min    Equipment Utilized During Treatment Gait belt             Past Medical History:  Diagnosis Date   MS (multiple sclerosis) (HCC)    Seizure (HCC)    Status epilepticus (HCC) 07/12/2018   Past Surgical History:  Procedure Laterality Date   NO PAST SURGERIES     Patient Active Problem List   Diagnosis Date Noted   Acute encephalopathy 04/26/2022   Neurological deficit present 04/26/2022   Acute metabolic encephalopathy 04/26/2022   Acute respiratory failure with hypoxia 08/23/2021   Status epilepticus 08/21/2021   History of optic neuritis 07/29/2020   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   High risk medication use 01/28/2020   Seizure disorder 12/04/2019   Marijuana use 12/04/2019   Seizures 01/22/2019   Gait disturbance 01/22/2019   Left-sided weakness 01/22/2019   Other fatigue 01/22/2019   Aspiration pneumonia 07/12/2018   Relapsing remitting multiple sclerosis 07/12/2018   Encounter for screening for HIV 07/12/2018    ONSET DATE: 04/25/22  REFERRING DIAG: R56.9 (ICD-10-CM) - Seizures; G35 (ICD-10-CM) - Relapsing remitting multiple sclerosis (HCC) R26.9 (ICD-10-CM) - Gait disturbance  THERAPY DIAG:  Unsteadiness on feet  Muscle weakness (generalized)  Rationale for Evaluation and Treatment: Rehabilitation  SUBJECTIVE:                                                                                                                                                                                              SUBJECTIVE STATEMENT: Pt with seizure activity on 04/25/22 and hospitalized x 1 week. Pt notes this seizure was more severe and leaving "feeling off and weird".   Pt accompanied by: self  PERTINENT HISTORY: MS, seizure activity  PAIN:  Are you having pain? No  PRECAUTIONS: Fall  WEIGHT BEARING RESTRICTIONS: No  FALLS: Has patient fallen in last 6 months? Yes. Number of falls 1  LIVING ENVIRONMENT: Lives with: lives with their family Lives in: House/apartment Stairs: Yes: Internal: 12 steps;  bilateral but cannot reach both Has following equipment at home: Single point cane  PLOF: Independent with household mobility with device and supervision for community  PATIENT GOALS: improve balance, endurance, strength  OBJECTIVE:   DIAGNOSTIC FINDINGS:   COGNITION: Overall cognitive status: History of cognitive impairments - at baseline   SENSATION: WFL  notes his leg and arm feel "stiff and locked out"  COORDINATION: Grossly impaired LLE: heel to shin, alternating movements   EDEMA:    MUSCLE TONE: LLE: Hypertonic    DTRs:    POSTURE: No Significant postural limitations  LOWER EXTREMITY ROM:     WFL, left ankle not tested due to AFO  LOWER EXTREMITY MMT:    LLE 3/5 gross RLE 5/5  BED MOBILITY:  Independent  TRANSFERS: Assistive device utilized: Single point cane  Sit to stand: Modified independence Stand to sit: Modified independence Chair to chair: Modified independence Floor:  NT    CURB:  Level of Assistance: SBA and CGA Assistive device utilized: Single point cane Curb Comments:   STAIRS: Level of Assistance: Modified independence and SBA Stair Negotiation Technique: Step to Pattern Alternating Pattern  with Bilateral Rails Number of Stairs: 12  Height of Stairs: 4-6"  Comments:   GAIT: Gait pattern: circumduction- Left, genu recurvatum-  Left, and abducted- Left Distance walked: 180 ft Assistive device utilized: Single point cane Level of assistance: SBA Comments:   FUNCTIONAL TESTS:  5 times sit to stand: 15 sec w/ pushing legs against chair Timed up and go (TUG): 17 sec Berg Balance Scale: 30/56 : 180 ft w/ cane  1.5 ft/sec = 1.0 mph     PATIENT EDUCATION: Education details: regarding fall risk assessment measures, rationale of PT intervention  Person educated: Patient Education method: Explanation Education comprehension: verbalized understanding  HOME EXERCISE PROGRAM: TBD  GOALS: Goals reviewed with patient? Yes  SHORT TERM GOALS: Target date: 06/06/2022    Demo improved BLE strength and control as evidenced by 12 sec 5xSTS without compensation Baseline:15 sec Goal status: INITIAL  2.  Patient will be independent in HEP to improve functional outcomes Baseline:  Goal status: INITIAL    LONG TERM GOALS: Target date: 06/27/2022    Demo reduced risk for falls per score 45/56 Berg Balance Test Baseline: 30/56 Goal status: INITIAL  2.  Reduce risk for falls per time 12 sec TUG test Baseline: 17 sec w/ cane Goal status: INITIAL  3.  Demo improved gait speed/efficiency per distance of 220 ft during Baseline: 180 ft w/ cane and left AFO Goal status: INITIAL    ASSESSMENT:  CLINICAL IMPRESSION: Patient is a 32 y.o. male who was seen today for physical therapy evaluation and treatment for generalized weakness and unsteadiness on feet related to history of seizures and MS.  Demonstrates generalized weakness and high risk for falls per outcome measures and presents with worse tone/spasticity affecting LLE and worsening gait deviations/compensations such as LLE genu recurvatum and deterioration of motor control experiencing multiple LOB during evaluation requiring therapist HHA and support.  Patient would benefit from PT services to address deficits and limitations to reduce risk for falls  and improve safety with transfers and gait for greater community access and safety with household negotiation.    OBJECTIVE IMPAIRMENTS: Abnormal gait, decreased activity tolerance, decreased balance, decreased coordination, decreased endurance, decreased knowledge of use of DME, difficulty walking, decreased strength, impaired perceived functional ability, impaired tone, improper body mechanics, and postural dysfunction.   ACTIVITY LIMITATIONS: carrying, lifting, standing, squatting, stairs,  transfers, reach over head, and locomotion level  PARTICIPATION LIMITATIONS: meal prep, cleaning, laundry, shopping, and community activity  PERSONAL FACTORS: Age and Time since onset of injury/illness/exacerbation are also affecting patient's functional outcome.   REHAB POTENTIAL: Good  CLINICAL DECISION MAKING: Evolving/moderate complexity  EVALUATION COMPLEXITY: Moderate  PLAN:  PT FREQUENCY: 1-2x/week, plan is 2x/wk x 3 wks, then 1x/wk x 3 wks  PT DURATION: 6 weeks  PLANNED INTERVENTIONS: Therapeutic exercises, Therapeutic activity, Neuromuscular re-education, Balance training, Gait training, Patient/Family education, Self Care, Joint mobilization, Stair training, Vestibular training, Canalith repositioning, Orthotic/Fit training, DME instructions, Aquatic Therapy, Dry Needling, Electrical stimulation, Wheelchair mobility training, Spinal mobilization, Cryotherapy, Moist heat, Ionotophoresis /ml Dexamethasone, and Manual therapy  PLAN FOR NEXT SESSION: quad tip cane, corner balance, tall kneeling   3:26 PM, 05/16/22 M. Shary Decamp, PT, DPT Physical Therapist- [place] Office Number: [phone]

## 2022-05-25 ENCOUNTER — Telehealth: Payer: Self-pay | Admitting: Neurology

## 2022-05-25 NOTE — Telephone Encounter (Signed)
Called and LVM for pt relaying Dr. Bonnita Hollow message. Reminded him about f/u 06/29/22 at 2pm. Asked him to call back if he has any further questions.

## 2022-05-25 NOTE — Telephone Encounter (Signed)
Pt came into office for an infusion, states he had a seizure in April and wanted to let office know. Scheduled appt for Dr. Ledon Snare next available 11/09/2022.

## 2022-05-25 NOTE — Telephone Encounter (Signed)
Pt last seen by Dr. Epimenio Foot 01/05/22. Per last note: "Seizure:   Continue Keppra 1000 mg p.o. twice daily.    Avoid dalfampridine (also avoid tramadol and Wellbutrin).   Add zonisamide nightly "  I called pt. Pt reports he had one seizure event in April but does not remember date. Not sure if anyone was with him. Does not feel he missed any seizure med doses prior to event. No seizures since event in April. He ended call d/t bad connection. Confirmed he is taking meds as prescribed.   Kim,RN messaged after stating pt here for Ocrevus infusion. Reported to them: he had seizure activity on 4/1 and was hospitalized for a week. Feels memory worse since seizure. Offered sooner appt 06/29/22 at 2pm with Dr. Epimenio Foot. Accepted and scheduled. Cx appt made for Oct. Added him to wait list as well.

## 2022-05-31 ENCOUNTER — Ambulatory Visit: Payer: Medicare (Managed Care) | Attending: Neurology

## 2022-05-31 DIAGNOSIS — R2681 Unsteadiness on feet: Secondary | ICD-10-CM

## 2022-05-31 DIAGNOSIS — M6281 Muscle weakness (generalized): Secondary | ICD-10-CM | POA: Insufficient documentation

## 2022-05-31 NOTE — Therapy (Signed)
OUTPATIENT PHYSICAL THERAPY NEURO TREATMENT   Patient Name: Miguel Black MRN: [id number] DOB:1990-07-18, 32 y.o., male Today's Date: 05/31/2022   PCP: none listed REFERRING PROVIDER: Asa Lente, MD  END OF SESSION:  PT End of Session - 05/31/22 1533     Visit Number 2    Number of Visits 9    Date for PT Re-Evaluation 06/27/22    Authorization Type [employer] Advantage; Medicaid secondary    Authorization Time Period auth required    Authorization - Visit Number 1    Authorization - Number of Visits 6    PT Start Time 1532    PT Stop Time 1615    PT Time Calculation (min) 43 min    Equipment Utilized During Treatment Gait belt             Past Medical History:  Diagnosis Date   MS (multiple sclerosis) (HCC)    Seizure (HCC)    Status epilepticus (HCC) 07/12/2018   Past Surgical History:  Procedure Laterality Date   NO PAST SURGERIES     Patient Active Problem List   Diagnosis Date Noted   Acute encephalopathy 04/26/2022   Neurological deficit present 04/26/2022   Acute metabolic encephalopathy 04/26/2022   Acute respiratory failure with hypoxia (HCC) 08/23/2021   Status epilepticus (HCC) 08/21/2021   History of optic neuritis 07/29/2020   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   High risk medication use 01/28/2020   Seizure disorder (HCC) 12/04/2019   Marijuana use 12/04/2019   Seizures (HCC) 01/22/2019   Gait disturbance 01/22/2019   Left-sided weakness 01/22/2019   Other fatigue 01/22/2019   Aspiration pneumonia (HCC) 07/12/2018   Relapsing remitting multiple sclerosis (HCC) 07/12/2018   Encounter for screening for HIV 07/12/2018    ONSET DATE: 04/25/22  REFERRING DIAG: R56.9 (ICD-10-CM) - Seizures; G35 (ICD-10-CM) - Relapsing remitting multiple sclerosis (HCC) R26.9 (ICD-10-CM) - Gait disturbance  THERAPY DIAG:  Unsteadiness on feet  Muscle weakness (generalized)  Difficulty in walking, not elsewhere  classified  Other abnormalities of gait and mobility  Rationale for Evaluation and Treatment: Rehabilitation  SUBJECTIVE:                                                                                                                                                                                             SUBJECTIVE STATEMENT: Had MS infusion the other day   Pt accompanied by: self  PERTINENT HISTORY: MS, seizure activity  PAIN:  Are you having pain? No  PRECAUTIONS: Fall  WEIGHT BEARING RESTRICTIONS: No  FALLS: Has patient fallen in last 6 months? Yes. Number of  falls 1  LIVING ENVIRONMENT: Lives with: lives with their family Lives in: House/apartment Stairs: Yes: Internal: 12 steps; bilateral but cannot reach both Has following equipment at home: Single point cane  PLOF: Independent with household mobility with device and supervision for community  PATIENT GOALS: improve balance, endurance, strength  OBJECTIVE:   TODAY'S TREATMENT: 05/31/22 Activity Comments  Sit to stand 3x5  Stride stance LLE bias   Tall kneeling -EO/EC x 30 sec -lateral mat touches 10x -hip hinge 2x10 (with resistance) -med ball press 1x10  Half-kneel/lunge position -static hold requiring mod-max A for balance   Gait training Quad tip for SPC, CGA and cues in more vertical alignment with AD. Training in sharp turns/sudden stops  Gastroc stretch 4x60 sec to inhibit PF tone W/ slantboard for left DF ROM        DIAGNOSTIC FINDINGS:   COGNITION: Overall cognitive status: History of cognitive impairments - at baseline   SENSATION: WFL  notes his leg and arm feel "stiff and locked out"  COORDINATION: Grossly impaired LLE: heel to shin, alternating movements   EDEMA:    MUSCLE TONE: LLE: Hypertonic    DTRs:    POSTURE: No Significant postural limitations  LOWER EXTREMITY ROM:     WFL, left ankle not tested due to AFO  LOWER EXTREMITY MMT:    LLE 3/5 gross RLE 5/5  BED  MOBILITY:  Independent  TRANSFERS: Assistive device utilized: Single point cane  Sit to stand: Modified independence Stand to sit: Modified independence Chair to chair: Modified independence Floor:  NT    CURB:  Level of Assistance: SBA and CGA Assistive device utilized: Single point cane Curb Comments:   STAIRS: Level of Assistance: Modified independence and SBA Stair Negotiation Technique: Step to Pattern Alternating Pattern  with Bilateral Rails Number of Stairs: 12  Height of Stairs: 4-6"  Comments:   GAIT: Gait pattern: circumduction- Left, genu recurvatum- Left, and abducted- Left Distance walked: 180 ft Assistive device utilized: Single point cane Level of assistance: SBA Comments:   FUNCTIONAL TESTS:  5 times sit to stand: 15 sec w/ pushing legs against chair Timed up and go (TUG): 17 sec Berg Balance Scale: 30/56 : 180 ft w/ cane  1.5 ft/sec = 1.0 mph     PATIENT EDUCATION: Education details: regarding fall risk assessment measures, rationale of PT intervention  Person educated: Patient Education method: Explanation Education comprehension: verbalized understanding  HOME EXERCISE PROGRAM: TBD  GOALS: Goals reviewed with patient? Yes  SHORT TERM GOALS: Target date: 06/06/2022    Demo improved BLE strength and control as evidenced by 12 sec 5xSTS without compensation Baseline:15 sec Goal status: IN PROGRESS  2.  Patient will be independent in HEP to improve functional outcomes Baseline:  Goal status: IN PROGRESS    LONG TERM GOALS: Target date: 06/27/2022    Demo reduced risk for falls per score 45/56 Berg Balance Test Baseline: 30/56 Goal status: IN PROGRESS  2.  Reduce risk for falls per time 12 sec TUG test Baseline: 17 sec w/ cane Goal status: IN PROGRESS  3.  Demo improved gait speed/efficiency per distance of 220 ft during Baseline: 180 ft w/ cane and left AFO Goal status: IN PROGRESS    ASSESSMENT:  CLINICAL  IMPRESSION: Initiated training in forced use of LLE to improve symmetry with sit to stand to reduce risk for falls followed by core/trunk/pelvis stability activities in kneeling to improve symmetric weightbearing and improve proximal stability. Progressed to trial of half-kneel/lunge  position but unable to maintain stability.  Improved stance stability in gait with quad tip single point cane and advised patient on obtaining device for his personal cane.  Gastroc stretching with tactile cues for position to improve ankle DF ROM for flexibliity and gait. Continued sessions to advance POC details.   OBJECTIVE IMPAIRMENTS: Abnormal gait, decreased activity tolerance, decreased balance, decreased coordination, decreased endurance, decreased knowledge of use of DME, difficulty walking, decreased strength, impaired perceived functional ability, impaired tone, improper body mechanics, and postural dysfunction.   ACTIVITY LIMITATIONS: carrying, lifting, standing, squatting, stairs, transfers, reach over head, and locomotion level  PARTICIPATION LIMITATIONS: meal prep, cleaning, laundry, shopping, and community activity  PERSONAL FACTORS: Age and Time since onset of injury/illness/exacerbation are also affecting patient's functional outcome.   REHAB POTENTIAL: Good  CLINICAL DECISION MAKING: Evolving/moderate complexity  EVALUATION COMPLEXITY: Moderate  PLAN:  PT FREQUENCY: 1-2x/week, plan is 2x/wk x 3 wks, then 1x/wk x 3 wks  PT DURATION: 6 weeks  PLANNED INTERVENTIONS: Therapeutic exercises, Therapeutic activity, Neuromuscular re-education, Balance training, Gait training, Patient/Family education, Self Care, Joint mobilization, Stair training, Vestibular training, Canalith repositioning, Orthotic/Fit training, DME instructions, Aquatic Therapy, Dry Needling, Electrical stimulation, Wheelchair mobility training, Spinal mobilization, Cryotherapy, Moist heat, Ionotophoresis 4mg /ml Dexamethasone, and  Manual therapy  PLAN FOR NEXT SESSION: corner balance, tall kneeling   3:33 PM, 05/31/22 M. Shary Decamp, PT, DPT Physical Therapist- [place] Office Number: [phone]

## 2022-06-03 ENCOUNTER — Encounter: Payer: Self-pay | Admitting: Internal Medicine

## 2022-06-03 ENCOUNTER — Ambulatory Visit (INDEPENDENT_AMBULATORY_CARE_PROVIDER_SITE_OTHER): Payer: Medicare (Managed Care) | Admitting: Internal Medicine

## 2022-06-03 VITALS — BP 122/84 | HR 80 | Temp 98.7°F | Ht 69.0 in | Wt 149.8 lb

## 2022-06-03 DIAGNOSIS — Z0000 Encounter for general adult medical examination without abnormal findings: Secondary | ICD-10-CM

## 2022-06-03 DIAGNOSIS — R270 Ataxia, unspecified: Secondary | ICD-10-CM

## 2022-06-03 DIAGNOSIS — G35 Multiple sclerosis: Secondary | ICD-10-CM

## 2022-06-03 DIAGNOSIS — M21379 Foot drop, unspecified foot: Secondary | ICD-10-CM | POA: Insufficient documentation

## 2022-06-03 DIAGNOSIS — F1290 Cannabis use, unspecified, uncomplicated: Secondary | ICD-10-CM

## 2022-06-03 DIAGNOSIS — M21372 Foot drop, left foot: Secondary | ICD-10-CM

## 2022-06-03 DIAGNOSIS — Z13220 Encounter for screening for lipoid disorders: Secondary | ICD-10-CM

## 2022-06-03 DIAGNOSIS — G40909 Epilepsy, unspecified, not intractable, without status epilepticus: Secondary | ICD-10-CM

## 2022-06-03 DIAGNOSIS — F17290 Nicotine dependence, other tobacco product, uncomplicated: Secondary | ICD-10-CM

## 2022-06-03 DIAGNOSIS — E559 Vitamin D deficiency, unspecified: Secondary | ICD-10-CM

## 2022-06-03 DIAGNOSIS — Z79899 Other long term (current) drug therapy: Secondary | ICD-10-CM

## 2022-06-03 DIAGNOSIS — Z9989 Dependence on other enabling machines and devices: Secondary | ICD-10-CM

## 2022-06-03 HISTORY — DX: Foot drop, unspecified foot: M21.379

## 2022-06-03 MED ORDER — VALTOCO 5 MG DOSE 5 MG/0.1ML NA LIQD
1.0000 | Freq: Every day | NASAL | 1 refills | Status: DC | PRN
Start: 2022-06-03 — End: 2022-10-03

## 2022-06-03 NOTE — Assessment & Plan Note (Addendum)
Encouraged him to get a foot drop brace off [employer] that links into the front shoelaces they are just 20 or 30 blocks and I think that will work really well First thought his brace was not connected down into issue but did not realize it is actually got a carbon fiber piece that connects all the way down in under the cushion and I think it is actually a really good foot drop brace that he already has so I was not sure what he needs and so I asked him to have his physical therapist send me Dr. Glenetta Hew specific request for the type and I also just wrote for a generic leg brace maybe they can use that

## 2022-06-04 DIAGNOSIS — R270 Ataxia, unspecified: Secondary | ICD-10-CM | POA: Insufficient documentation

## 2022-06-04 DIAGNOSIS — Z9989 Dependence on other enabling machines and devices: Secondary | ICD-10-CM | POA: Insufficient documentation

## 2022-06-04 NOTE — Assessment & Plan Note (Signed)
Encouraged patient to maintain regular follow up with neurology for management He has no rescue medication(s) , will try to get nasal spray for mom who is very worried due to history status epilepticus. I also encouraged having a dog to help increase awareness when seizures occur Seems to be currently well-controlled on current dose keppra Will get keppra levels with baseline wellness labs today

## 2022-06-04 NOTE — Patient Instructions (Addendum)
Welcome aboard!   Today's visit was a valuable first step in understanding your health and starting your personalized care journey. We discussed your medical history and medications in detail. Given the extensive information, we prioritized addressing your most pressing concerns.  We understood those concerns to be:  New Patient (Initial Visit), Requesting leg brace, and Requesting labs to check seizure medication levels   Building a Complete Picture  To create the most effective care plan possible, we may need additional information from previous providers. We encouraged you to gather any relevant medical records for your next visit. This will help [place] build a more complete picture and develop a personalized plan together. In the meantime, we'll address your immediate concerns and provide resources to help you manage all of your medical issues.  We encourage you to use MyChart to review these efforts, and to help [place] find and correct any omissions or errors in your medical chart.  Managing Your Health Over Time  Managing every aspect of your health in a single visit isn't always feasible, but that's okay.  We addressed your most pressing concerns today and charted a course for future care. Acute conditions or preventive care measures may require further attention.  We encourage you to schedule a follow-up visit at your earliest convenience to discuss any unresolved issues.  We strongly encourage participation in annual preventive care visits to help [place] develop a more thorough understanding of your health and to help you maintain optimal wellness - please inquire about scheduling your next one with [place] at your earliest convenience.  Your Satisfaction Matters  It was a pleasure seeing you today!  Your health and satisfaction will always be my top priorities. If you believe your experience today was worthy of a 5-star rating, I'd be grateful for your feedback!  Lula Olszewski, MD   Next  Steps  Schedule Follow-Up:  We recommend a follow-up appointment in [place] in about 3 months (around 09/03/2022) for chronic disease monitoring and management. If your condition worsens before then, please call us or seek emergency care. Preventive Care:  Don't forget to schedule your annual preventive care visit!  This important checkup is typically covered by insurance and helps identify potential health issues early.  Typically its 100% insurance covered with no co-pay and helps to get surveillance labwork paid for through your insurance provider.  Sometimes it even lowers your insurance premiums to participate. Medical Information Release:  For any relevant medical information we don't have, please sign a release form so we can obtain it for your records. Lab & X-ray Appointments:  Scheduled any incomplete lab tests today or call us to schedule.  X-Rays can be done without an appointment at [facility] at [facility] ([address], [address]), M-F 8:30am-noon or 1pm-5pm.  Just tell them you're there for X-rays ordered by Dr. Jon Billings.  We'll receive the results and contact you by phone or MyChart to discuss next steps.  Bring to Your Next Appointment  Medications: Please bring all your medication bottles to your next appointment to ensure we have an accurate record of your prescriptions. Health Diaries: If you're monitoring any health conditions at home, keeping a diary of your readings can be very helpful for discussions at your next appointment.  Reviewing Your Records  Please Review this early draft of your clinical notes below and the final encounter summary tomorrow on MyChart after its been completed.   Left foot drop Assessment & Plan: Encouraged him to get  a foot drop brace off [employer] - wrote dme for him to take to medical supply shop and encouraged him to take it to his physical therapy to get more details on exactly what is desired first First thought his brace was not connected  down into issue but did not realize it is actually got a carbon fiber piece that connects all the way down in under the cushion and I think it is actually a really good foot drop brace that he already has so I was not sure what he needs and so I asked him to have his physical therapist send me Dr. Glenetta Hew specific request for the type and I also just wrote for a generic leg brace maybe they can use that  Orders: -     For Home Use Only DME Trilock ankle brace  Seizure disorder [facility]) Assessment & Plan: Encouraged patient to maintain regular follow up with neurology for management He has no rescue medication(s) , will try to get nasal spray for mom who is very worried due to history status epilepticus. I also encouraged having a dog to help increase awareness when seizures occur Seems to be currently well-controlled on current dose keppra Will get keppra levels with baseline wellness labs today   Orders: -     CBC with Differential/Platelet -     Comprehensive metabolic panel -     For Home Use Only DME Trilock ankle brace -     Valtoco 5 MG Dose; Place 1 spray into the nose daily as needed.  Dispense: 1 each; Refill: 1  Relapsing remitting multiple sclerosis (HCC) -     Levetiracetam level  High risk medication use -     CBC with Differential/Platelet -     Comprehensive metabolic panel  Vitamin D deficiency -     VITAMIN D 25 Hydroxy (Vit-D Deficiency, Fractures)  Smokes cigars Assessment & Plan: Encouraged cessation, he is precontemplative at present  Orders: -     Lipid panel  Marijuana use Assessment & Plan: Encouraged safe use- avoid street products that are often tainted.    Ambulates with cane  Preventative health care -     Lipid panel  Ataxia     Getting Answers and Following Up  Simple Questions & Concerns: For quick questions or basic follow-up after your visit, reach [place] at [phone] or MyChart messaging. Complex Concerns: If your concern is  more complex, scheduling an appointment might be best. Discuss this with the staff to find the most suitable option. Lab & Imaging Results: We'll contact you directly if results are abnormal or you don't use MyChart. Most normal results will be on MyChart within 2-3 business days, with a review message from Dr. Jon Billings. Haven't heard back in 2 weeks? Need results sooner? Contact us at [phone]. Referrals: Our referral coordinator will manage specialist referrals. The specialist's office should contact you within 2 weeks to schedule an appointment. Call us if you haven't heard from them after 2 weeks.  Staying Connected  MyChart: Activate your MyChart for the fastest way to access results and message [place]. See the last page of this paperwork for instructions.  Billing  X-ray & Lab Orders: These are billed by separate companies. Contact the invoicing company directly for questions or concerns. Visit Charges: Discuss any billing inquiries with our administrative services team.  Feedback & Satisfaction  Share Your Experience: We strive for your satisfaction! If you have any complaints, please let Dr. Jon Billings know  directly or contact our Practice Administrators, Edwena Felty or [employer], by asking at the front desk.  Scheduling Tips  Shorter Wait Times: 8 am and 1 pm appointments often have the quickest wait times. Longer Appointments: If you need more time during your visit, talk to the front desk. Due to insurance regulations, multiple back-to-back appointments might be necessary.

## 2022-06-04 NOTE — Assessment & Plan Note (Signed)
Encouraged safe use- avoid street products that are often tainted.

## 2022-06-04 NOTE — Assessment & Plan Note (Signed)
Encouraged cessation, he is precontemplative at present

## 2022-06-04 NOTE — Progress Notes (Signed)
[facility]  Phone: [phone]  New patient visit  Visit Date: 06/03/2022 Patient: Miguel Black   DOB: February 27, 1990   31 y.o. Male  MRN: [id number] PCP:  Lula Olszewski, MD  (establishing care today)  Today's Health Care Provider: Lula Olszewski, MD   Assessment & Plan  :   This initial visit focused on establishing a primary care relationship and gathering a comprehensive medical history. We addressed key concerns and prioritized next steps. For any remaining concerns, we encourage Mr.Miguel Black to schedule a follow-up appointment at his earliest convenience.   To further complete the medical history and optimize health maintenance, we also recommend scheduling an Annual Comprehensive Physical Exam (CPE) also known as Preventive Medicine Visit.  Chief Complaint  Patient presents with   New Patient (Initial Visit)   Requesting leg brace   Requesting labs to check seizure medication levels    Left Black drop Assessment & Plan: Encouraged him to get a Black drop brace off [employer] - wrote dme for him to take to medical supply shop and encouraged him to take it to his physical therapy to get more details on exactly what is desired first First thought his brace was not connected down into issue but did not realize it is actually got a carbon fiber piece that connects all the way down in under the cushion and I think it is actually a really good Black drop brace that he already has so I was not sure what he needs and so I asked him to have his physical therapist send me Dr. Glenetta Hew specific request for the type and I also just wrote for a generic leg brace maybe they can use that  Orders: -     For Home Use Only DME Trilock ankle brace  Seizure disorder [facility]) Assessment & Plan: Encouraged patient to maintain regular follow up with neurology for management He has no rescue medication(s) , will try to get nasal spray for mom who is very worried due to history  status epilepticus. I also encouraged having a dog to help increase awareness when seizures occur Seems to be currently well-controlled on current dose keppra Will get keppra levels with baseline wellness labs today   Orders: -     CBC with Differential/Platelet -     Comprehensive metabolic panel -     For Home Use Only DME Trilock ankle brace -     Valtoco 5 MG Dose; Place 1 spray into the nose daily as needed.  Dispense: 1 each; Refill: 1  Relapsing remitting multiple sclerosis (HCC) -     Levetiracetam level  High risk medication use -     CBC with Differential/Platelet -     Comprehensive metabolic panel  Vitamin D deficiency -     VITAMIN D 25 Hydroxy (Vit-D Deficiency, Fractures)  Smokes cigars Assessment & Plan: Encouraged cessation, he is precontemplative at present  Orders: -     Lipid panel  Marijuana use Assessment & Plan: Encouraged safe use- avoid street products that are often tainted.    Ambulates with cane  Preventative health care -     Lipid panel  Ataxia   Treatment plan discussed and reviewed in detail. Explained medication safety and potential side effects. Agreed on patient returning to office if symptoms worsen, persist, or new symptoms develop. Discussed precautions in case of needing to visit the Emergency Department. Answered all patient questions and confirmed understanding and comfort with the plan.  Encouraged patient to contact our office if they have any questions or concerns.  Shared plan with his neurologist to coordinate care.           Subjective:    Mr.Miguel Black presents today with intent to establish care with Lula Olszewski, MD as his Primary Care Provider (PCP) going forward.   His main concern(s) / chief complaint(s) are New Patient (Initial Visit), Requesting leg brace, and Requesting labs to check seizure medication levels   HPI  Problem-oriented charting was used to develop and update his medical history: Problem   Ambulates With Cane  Ataxia  Black Drop   Has a left Black drop ever since 2015 first flare of multiple sclerosis had a brace in 2022 custom but does not actually do anything to lift the from the Black physical therapist advised him to get an alternative Black drop brace but I cannot find record of that   Smokes Cigars  Seizure Disorder (Hcc)    Patient reports that they are consistently taking keppra and zonegran and following with Dr. Epimenio Black   Marijuana Use     Depression Screen    06/03/2022    3:07 PM 01/15/2020    2:34 PM 01/07/2019    2:32 PM  PHQ 2/9 Scores  PHQ - 2 Score 0 0 0  PHQ- 9 Score 2 0    Results for orders placed or performed in visit on 06/03/22  CBC w/Diff  Result Value Ref Range   WBC 7.7 3.8 - 10.8 Thousand/uL   RBC 4.63 4.20 - 5.80 Million/uL   Hemoglobin 14.6 13.2 - 17.1 g/dL   HCT 16.1 09.6 - 04.5 %   MCV 92.2 80.0 - 100.0 fL   MCH 31.5 27.0 - 33.0 pg   MCHC 34.2 32.0 - 36.0 g/dL   RDW 40.9 81.1 - 91.4 %   Platelets 278 140 - 400 Thousand/uL   MPV 10.6 7.5 - 12.5 fL   Neutro Abs 5,198 1,500 - 7,800 cells/uL   Lymphs Abs 1,786 850 - 3,900 cells/uL   Absolute Monocytes 624 200 - 950 cells/uL   Eosinophils Absolute 54 15 - 500 cells/uL   Basophils Absolute 39 0 - 200 cells/uL   Neutrophils Relative % 67.5 %   Total Lymphocyte 23.2 %   Monocytes Relative 8.1 %   Eosinophils Relative 0.7 %   Basophils Relative 0.5 %  Comp Met (CMET)  Result Value Ref Range   Glucose, Bld 92 65 - 99 mg/dL   BUN 14 7 - 25 mg/dL   Creat 7.82 9.56 - 2.13 mg/dL   BUN/Creatinine Ratio SEE NOTE: 6 - 22 (calc)   Sodium 136 135 - 146 mmol/L   Potassium 4.0 3.5 - 5.3 mmol/L   Chloride 101 98 - 110 mmol/L   CO2 24 20 - 32 mmol/L   Calcium 9.7 8.6 - 10.3 mg/dL   Total Protein 7.3 6.1 - 8.1 g/dL   Albumin 4.6 3.6 - 5.1 g/dL   Globulin 2.7 1.9 - 3.7 g/dL (calc)   AG Ratio 1.7 1.0 - 2.5 (calc)   Total Bilirubin 0.5 0.2 - 1.2 mg/dL   Alkaline phosphatase (APISO) 74 36 -  130 U/L   AST 13 10 - 40 U/L   ALT 15 9 - 46 U/L  Lipid panel  Result Value Ref Range   Cholesterol 199 <200 mg/dL   HDL 69 > OR = 40 mg/dL   Triglycerides 77 <086 mg/dL   LDL Cholesterol (Calc) 113 (  H) mg/dL (calc)   Total CHOL/HDL Ratio 2.9 <5.0 (calc)   Non-HDL Cholesterol (Calc) 130 (H) <130 mg/dL (calc)  Vitamin D (25 hydroxy)  Result Value Ref Range   Vit D, 25-Hydroxy 33 30 - 100 ng/mL    The following were reviewed and entered/updated into his MEDICAL RECORD NUMBERPast Medical History:  Diagnosis Date   Acute encephalopathy 04/26/2022   Acute metabolic encephalopathy 04/26/2022   Acute respiratory failure with hypoxia (HCC) 08/23/2021   Aspiration pneumonia (HCC) 07/12/2018   Closed fracture of distal phalanx or phalanges of hand 02/05/2009   Formatting of this note might be different from the original. Overview: The original diagnosis was replaced automatically by the ICD-10 team to an ICD-10 compliant one. Please review for accuracy   Encounter for screening for HIV 07/12/2018   Black drop 06/03/2022   Has a left Black drop ever since 2015 first flare of multiple sclerosis had a brace in 2022 custom but does not actually do anything to lift the from the Black physical therapist advised him to get an alternative Black drop brace but I cannot find record of that   Marijuana abuse 05/01/2013   MS (multiple sclerosis) (HCC)    Seizure (HCC)    Seizures (HCC) 01/22/2019   Status epilepticus (HCC) 07/12/2018   Past Surgical History:  Procedure Laterality Date   NO PAST SURGERIES     Family History  Problem Relation Age of Onset   Arthritis Mother    Hypertension Mother    Healthy Father    Healthy Sister    Healthy Brother    Hodgkin's lymphoma Brother    Colon cancer Maternal Uncle 45   Kidney disease Maternal Grandmother    Hypertension Maternal Grandmother    Arthritis Maternal Grandmother    Diabetes Maternal Grandmother    Stroke Maternal Grandfather     Hypertension Maternal Grandfather    Diabetes Paternal Grandmother    Outpatient Medications Prior to Visit  Medication Sig Dispense Refill   cholecalciferol (VITAMIN D3) 25 MCG (1000 UNIT) tablet Take 5,000 Units by mouth daily.     levETIRAcetam (KEPPRA) 1000 MG tablet Take 1 tablet (1,000 mg total) by mouth 2 (two) times daily. 180 tablet 4   OCREVUS 300 MG/10ML injection INFUSE 600MG  IN 0.9% NS INTRAVENOUSLY AT 40ML\HR AND INCREASE BY 40ML\HR EVERY 30 MINUTES (MAX 200ML\HR) OVER AT LEAST 3.5HRS AS DIRECTED EVERY 6 MONTHS. 20 mL 1   zonisamide (ZONEGRAN) 100 MG capsule Take 2 capsules (200 mg total) by mouth daily. 90 capsule 1   No facility-administered medications prior to visit.    Allergies  Allergen Reactions   Dalfampridine Other (See Comments)    Seizure 02/2021 while on dalfampridine   Social History   Tobacco Use   Smoking status: Some Days    Types: Cigars   Smokeless tobacco: Never   Tobacco comments:    2.5-3 per day  Vaping Use   Vaping Use: Never used  Substance Use Topics   Alcohol use: Yes    Comment: sometimes    Drug use: Yes    Types: Marijuana    Comment: Reports he smokes a lot, cannot put a number to it    Immunization History  Administered Date(s) Administered   Pneumococcal Polysaccharide-23 05/01/2013   Tdap 05/01/2013           Objective:  Physical Exam  BP 122/84 (BP Location: Left Arm, Patient Position: Sitting)   Pulse 80   Temp 98.7 F (  37.1 C) (Temporal)   Ht 5\' 9"  (1.753 m)   Wt 149 lb 12.8 oz (67.9 kg)   SpO2 100%   BMI 22.12 kg/m  Wt Readings from Last 10 Encounters:  06/03/22 149 lb 12.8 oz (67.9 kg)  01/05/22 161 lb 8 oz (73.3 kg)  08/27/21 150 lb 5.7 oz (68.2 kg)  06/24/21 153 lb (69.4 kg)  03/10/21 158 lb 8.2 oz (71.9 kg)  02/04/21 161 lb (73 kg)  07/29/20 148 lb (67.1 kg)  01/28/20 154 lb (69.9 kg)  01/15/20 161 lb (73 kg)  12/03/19 165 lb (74.8 kg)   Vital signs reviewed.  Nursing notes reviewed. Weight  trend reviewed. Abnormalities and problem-specific physical exam findings:  intranuclear opthalmoplegia, wears custom carbon fiber Black drop brace on left, uses cane to walk, very unsteady on feet. General Appearance:  Well developed, well nourished, well-groomed, healthy-appearing male with Body mass index is 22.12 kg/m. No acute distress appreciable.   Skin: Clear and well-hydrated. Pulmonary:  Normal work of breathing at rest, no respiratory distress apparent. SpO2: 100 %  Musculoskeletal: He demonstrates smooth and coordinated movements throughout all major joints.All extremities are intact.  Neurological:  Awake, alert, oriented, and engaged.  No obvious focal neurological deficits or cognitive impairments.  Sensorium seems unclouded. Gait is smooth and coordinated Psychiatric:  Appropriate mood, pleasant and cooperative demeanor, cheerful and engaged during the exam  Results Reviewed (reviewed labs/imaging may be also be found in the assessment / plan section):    Results for orders placed or performed in visit on 06/03/22  CBC w/Diff  Result Value Ref Range   WBC 7.7 3.8 - 10.8 Thousand/uL   RBC 4.63 4.20 - 5.80 Million/uL   Hemoglobin 14.6 13.2 - 17.1 g/dL   HCT 13.2 44.0 - 10.2 %   MCV 92.2 80.0 - 100.0 fL   MCH 31.5 27.0 - 33.0 pg   MCHC 34.2 32.0 - 36.0 g/dL   RDW 72.5 36.6 - 44.0 %   Platelets 278 140 - 400 Thousand/uL   MPV 10.6 7.5 - 12.5 fL   Neutro Abs 5,198 1,500 - 7,800 cells/uL   Lymphs Abs 1,786 850 - 3,900 cells/uL   Absolute Monocytes 624 200 - 950 cells/uL   Eosinophils Absolute 54 15 - 500 cells/uL   Basophils Absolute 39 0 - 200 cells/uL   Neutrophils Relative % 67.5 %   Total Lymphocyte 23.2 %   Monocytes Relative 8.1 %   Eosinophils Relative 0.7 %   Basophils Relative 0.5 %  Comp Met (CMET)  Result Value Ref Range   Glucose, Bld 92 65 - 99 mg/dL   BUN 14 7 - 25 mg/dL   Creat 3.47 4.25 - 9.56 mg/dL   BUN/Creatinine Ratio SEE NOTE: 6 - 22 (calc)    Sodium 136 135 - 146 mmol/L   Potassium 4.0 3.5 - 5.3 mmol/L   Chloride 101 98 - 110 mmol/L   CO2 24 20 - 32 mmol/L   Calcium 9.7 8.6 - 10.3 mg/dL   Total Protein 7.3 6.1 - 8.1 g/dL   Albumin 4.6 3.6 - 5.1 g/dL   Globulin 2.7 1.9 - 3.7 g/dL (calc)   AG Ratio 1.7 1.0 - 2.5 (calc)   Total Bilirubin 0.5 0.2 - 1.2 mg/dL   Alkaline phosphatase (APISO) 74 36 - 130 U/L   AST 13 10 - 40 U/L   ALT 15 9 - 46 U/L  Lipid panel  Result Value Ref Range   Cholesterol 199 <200  mg/dL   HDL 69 > OR = 40 mg/dL   Triglycerides 77 <161 mg/dL   LDL Cholesterol (Calc) 113 (H) mg/dL (calc)   Total CHOL/HDL Ratio 2.9 <5.0 (calc)   Non-HDL Cholesterol (Calc) 130 (H) <130 mg/dL (calc)  Vitamin D (25 hydroxy)  Result Value Ref Range   Vit D, 25-Hydroxy 33 30 - 100 ng/mL

## 2022-06-07 ENCOUNTER — Ambulatory Visit: Payer: Medicare (Managed Care)

## 2022-06-07 DIAGNOSIS — M6281 Muscle weakness (generalized): Secondary | ICD-10-CM

## 2022-06-07 DIAGNOSIS — R2681 Unsteadiness on feet: Secondary | ICD-10-CM

## 2022-06-07 NOTE — Therapy (Signed)
OUTPATIENT PHYSICAL THERAPY NEURO TREATMENT   Patient Name: Miguel Black MRN: [id number] DOB:07-Jan-1991, 32 y.o., male Today's Date: 06/07/2022   PCP: none listed REFERRING PROVIDER: Asa Lente, MD  END OF SESSION:  PT End of Session - 06/07/22 1316     Visit Number 3    Number of Visits 9    Date for PT Re-Evaluation 06/27/22    Authorization Type [employer] Medicare Advantage; Medicaid secondary    Authorization Time Period 6 visits approved    Authorization - Visit Number 2    Authorization - Number of Visits 6    PT Start Time 1315    PT Stop Time 1400    PT Time Calculation (min) 45 min    Equipment Utilized During Treatment Gait belt             Past Medical History:  Diagnosis Date   Acute encephalopathy 04/26/2022   Acute metabolic encephalopathy 04/26/2022   Acute respiratory failure with hypoxia (HCC) 08/23/2021   Aspiration pneumonia (HCC) 07/12/2018   Closed fracture of distal phalanx or phalanges of hand 02/05/2009   Formatting of this note might be different from the original. Overview: The original diagnosis was replaced automatically by the ICD-10 team to an ICD-10 compliant one. Please review for accuracy   Encounter for screening for HIV 07/12/2018   Foot drop 06/03/2022   Has a left foot drop ever since 2015 first flare of multiple sclerosis had a brace in 2022 custom but does not actually do anything to lift the from the foot physical therapist advised him to get an alternative foot drop brace but I cannot find record of that   Marijuana abuse 05/01/2013   MS (multiple sclerosis) (HCC)    Seizure (HCC)    Seizures (HCC) 01/22/2019   Status epilepticus (HCC) 07/12/2018   Past Surgical History:  Procedure Laterality Date   NO PAST SURGERIES     Patient Active Problem List   Diagnosis Date Noted   Ambulates with cane 06/04/2022   Ataxia 06/04/2022   Foot drop 06/03/2022   Vitamin D deficiency 06/03/2022   Smokes cigars 06/03/2022    Neurological deficit present 04/26/2022   History of optic neuritis 07/29/2020   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   High risk medication use 01/28/2020   Seizure disorder (HCC) 12/04/2019   Marijuana use 12/04/2019   Gait disturbance 01/22/2019   Left-sided weakness 01/22/2019   Other fatigue 01/22/2019   Relapsing remitting multiple sclerosis (HCC) 07/12/2018   Hypermetropia of left eye 05/30/2016    ONSET DATE: 04/25/22  REFERRING DIAG: R56.9 (ICD-10-CM) - Seizures; G35 (ICD-10-CM) - Relapsing remitting multiple sclerosis (HCC) R26.9 (ICD-10-CM) - Gait disturbance  THERAPY DIAG:  Unsteadiness on feet  Muscle weakness (generalized)  Rationale for Evaluation and Treatment: Rehabilitation  SUBJECTIVE:  SUBJECTIVE STATEMENT: Got a quad tip for cane, works great. Patient reports he has discontinued    Pt accompanied by: self  PERTINENT HISTORY: MS, seizure activity  PAIN:  Are you having pain? No  PRECAUTIONS: Fall  WEIGHT BEARING RESTRICTIONS: No  FALLS: Has patient fallen in last 6 months? Yes. Number of falls 1  LIVING ENVIRONMENT: Lives with: lives with their family Lives in: House/apartment Stairs: Yes: Internal: 12 steps; bilateral but cannot reach both Has following equipment at home: Single point cane  PLOF: Independent with household mobility with device and supervision for community  PATIENT GOALS: improve balance, endurance, strength  OBJECTIVE:   TODAY'S TREATMENT: 06/07/22 Activity Comments  Sit to stand 3x5 LLE stride 10#  Shadow boxing W/ door frame to cross and slip for coordination  Foot on step 3x30 sec  For LLE stance control  Fast swing phase LLE W/ 4# ankle weight 3x30 sec  High knee drive LLE 1O10 4#  Lateral step and load LLE 1x10 On  bosu  Forward load and lunge LLE 1x10 On bosu  Multi-sensory balance challenge    5xSTS test 17 sec         DIAGNOSTIC FINDINGS:   COGNITION: Overall cognitive status: History of cognitive impairments - at baseline   SENSATION: WFL  notes his leg and arm feel "stiff and locked out"  COORDINATION: Grossly impaired LLE: heel to shin, alternating movements   EDEMA:    MUSCLE TONE: LLE: Hypertonic    DTRs:    POSTURE: No Significant postural limitations  LOWER EXTREMITY ROM:     WFL, left ankle not tested due to AFO  LOWER EXTREMITY MMT:    LLE 3/5 gross RLE 5/5  BED MOBILITY:  Independent  TRANSFERS: Assistive device utilized: Single point cane  Sit to stand: Modified independence Stand to sit: Modified independence Chair to chair: Modified independence Floor:  NT    CURB:  Level of Assistance: SBA and CGA Assistive device utilized: Single point cane Curb Comments:   STAIRS: Level of Assistance: Modified independence and SBA Stair Negotiation Technique: Step to Pattern Alternating Pattern  with Bilateral Rails Number of Stairs: 12  Height of Stairs: 4-6"  Comments:   GAIT: Gait pattern: circumduction- Left, genu recurvatum- Left, and abducted- Left Distance walked: 180 ft Assistive device utilized: Single point cane Level of assistance: SBA Comments:   FUNCTIONAL TESTS:  5 times sit to stand: 15 sec w/ pushing legs against chair Timed up and go (TUG): 17 sec Berg Balance Scale: 30/56 : 180 ft w/ cane  1.5 ft/sec = 1.0 mph       GOALS: Goals reviewed with patient? Yes  SHORT TERM GOALS: Target date: 06/06/2022    Demo improved BLE strength and control as evidenced by 12 sec 5xSTS without compensation Baseline:15 sec; 17 sec Goal status: NOT MET  2.  Patient will be independent in HEP to improve functional outcomes Baseline:  Goal status: MET    LONG TERM GOALS: Target date: 06/27/2022    Demo reduced risk for falls  per score 45/56 Berg Balance Test Baseline: 30/56 Goal status: IN PROGRESS  2.  Reduce risk for falls per time 12 sec TUG test Baseline: 17 sec w/ cane Goal status: IN PROGRESS  3.  Demo improved gait speed/efficiency per distance of 220 ft during Baseline: 180 ft w/ cane and left AFO Goal status: IN PROGRESS    ASSESSMENT:  CLINICAL IMPRESSION: Continued with training to improve balance, coordination, and motor  control with emphasis on LLE stance phase control contrasted with techniques to facilitate fast, alternating movements LLE for swing phase.  Difficulty with multi-sensory balance challenges with decrease in LLE stability with fatigue.  5xSTS test did not reveal improved time, but does exhibit better control as in less reliance on pushing backs of legs to chair for stability/extension thrust. Continued sessions to progress mobility and achieve STG/LTG  OBJECTIVE IMPAIRMENTS: Abnormal gait, decreased activity tolerance, decreased balance, decreased coordination, decreased endurance, decreased knowledge of use of DME, difficulty walking, decreased strength, impaired perceived functional ability, impaired tone, improper body mechanics, and postural dysfunction.   ACTIVITY LIMITATIONS: carrying, lifting, standing, squatting, stairs, transfers, reach over head, and locomotion level  PARTICIPATION LIMITATIONS: meal prep, cleaning, laundry, shopping, and community activity  PERSONAL FACTORS: Age and Time since onset of injury/illness/exacerbation are also affecting patient's functional outcome.   REHAB POTENTIAL: Good  CLINICAL DECISION MAKING: Evolving/moderate complexity  EVALUATION COMPLEXITY: Moderate  PLAN:  PT FREQUENCY: 1-2x/week, plan is 2x/wk x 3 wks, then 1x/wk x 3 wks  PT DURATION: 6 weeks  PLANNED INTERVENTIONS: Therapeutic exercises, Therapeutic activity, Neuromuscular re-education, Balance training, Gait training, Patient/Family education, Self Care, Joint  mobilization, Stair training, Vestibular training, Canalith repositioning, Orthotic/Fit training, DME instructions, Aquatic Therapy, Dry Needling, Electrical stimulation, Wheelchair mobility training, Spinal mobilization, Cryotherapy, Moist heat, Ionotophoresis 4mg /ml Dexamethasone, and Manual therapy  PLAN FOR NEXT SESSION: corner balance, tall kneeling   1:16 PM, 06/07/22 M. Shary Decamp, PT, DPT Physical Therapist- [place] Office Number: [phone]

## 2022-06-08 LAB — CBC WITH DIFFERENTIAL/PLATELET
Absolute Monocytes: 624 cells/uL (ref 200–950)
Basophils Absolute: 39 cells/uL (ref 0–200)
Basophils Relative: 0.5 %
Eosinophils Absolute: 54 cells/uL (ref 15–500)
Eosinophils Relative: 0.7 %
HCT: 42.7 % (ref 38.5–50.0)
Hemoglobin: 14.6 g/dL (ref 13.2–17.1)
Lymphs Abs: 1786 cells/uL (ref 850–3900)
MCH: 31.5 pg (ref 27.0–33.0)
MCHC: 34.2 g/dL (ref 32.0–36.0)
MCV: 92.2 fL (ref 80.0–100.0)
MPV: 10.6 fL (ref 7.5–12.5)
Monocytes Relative: 8.1 %
Neutro Abs: 5198 cells/uL (ref 1500–7800)
Neutrophils Relative %: 67.5 %
Platelets: 278 10*3/uL (ref 140–400)
RBC: 4.63 10*6/uL (ref 4.20–5.80)
RDW: 13.1 % (ref 11.0–15.0)
Total Lymphocyte: 23.2 %
WBC: 7.7 10*3/uL (ref 3.8–10.8)

## 2022-06-08 LAB — LEVETIRACETAM LEVEL: Keppra (Levetiracetam): <2 ug/mL — ABNORMAL LOW

## 2022-06-08 LAB — LIPID PANEL
Cholesterol: 199 mg/dL (ref <200)
HDL: 69 mg/dL (ref >=40)
LDL Cholesterol (Calc): 113 mg/dL (calc) — ABNORMAL HIGH
Non-HDL Cholesterol (Calc): 130 mg/dL (calc) — ABNORMAL HIGH (ref <130)
Total CHOL/HDL Ratio: 2.9 (calc) (ref <5.0)
Triglycerides: 77 mg/dL (ref <150)

## 2022-06-08 LAB — COMPREHENSIVE METABOLIC PANEL
AG Ratio: 1.7 (calc) (ref 1.0–2.5)
ALT: 15 U/L (ref 9–46)
AST: 13 U/L (ref 10–40)
Albumin: 4.6 g/dL (ref 3.6–5.1)
Alkaline phosphatase (APISO): 74 U/L (ref 36–130)
BUN: 14 mg/dL (ref 7–25)
CO2: 24 mmol/L (ref 20–32)
Calcium: 9.7 mg/dL (ref 8.6–10.3)
Chloride: 101 mmol/L (ref 98–110)
Creat: 1.01 mg/dL (ref 0.60–1.26)
Globulin: 2.7 g/dL (calc) (ref 1.9–3.7)
Glucose, Bld: 92 mg/dL (ref 65–99)
Potassium: 4 mmol/L (ref 3.5–5.3)
Sodium: 136 mmol/L (ref 135–146)
Total Bilirubin: 0.5 mg/dL (ref 0.2–1.2)
Total Protein: 7.3 g/dL (ref 6.1–8.1)

## 2022-06-08 LAB — VITAMIN D 25 HYDROXY (VIT D DEFICIENCY, FRACTURES): Vit D, 25-Hydroxy: 33 ng/mL (ref 30–100)

## 2022-06-09 ENCOUNTER — Ambulatory Visit: Payer: Medicare (Managed Care)

## 2022-06-13 ENCOUNTER — Other Ambulatory Visit: Payer: Self-pay

## 2022-06-13 ENCOUNTER — Inpatient Hospital Stay (HOSPITAL_COMMUNITY): Payer: Medicare (Managed Care)

## 2022-06-13 ENCOUNTER — Emergency Department (HOSPITAL_COMMUNITY): Payer: Medicare (Managed Care)

## 2022-06-13 ENCOUNTER — Encounter (HOSPITAL_COMMUNITY): Payer: Self-pay

## 2022-06-13 ENCOUNTER — Inpatient Hospital Stay (HOSPITAL_COMMUNITY)
Admission: EM | Admit: 2022-06-13 | Discharge: 2022-06-16 | DRG: 101 | Disposition: A | Discharge status: Home / Self Care | Payer: Medicare (Managed Care) | Attending: Internal Medicine | Admitting: Internal Medicine

## 2022-06-13 DIAGNOSIS — Z91148 Patient's other noncompliance with medication regimen for other reason: Secondary | ICD-10-CM | POA: Diagnosis not present

## 2022-06-13 DIAGNOSIS — R569 Unspecified convulsions: Secondary | ICD-10-CM | POA: Diagnosis not present

## 2022-06-13 DIAGNOSIS — R159 Full incontinence of feces: Secondary | ICD-10-CM | POA: Diagnosis present

## 2022-06-13 DIAGNOSIS — R0689 Other abnormalities of breathing: Secondary | ICD-10-CM | POA: Diagnosis present

## 2022-06-13 DIAGNOSIS — L89152 Pressure ulcer of sacral region, stage 2: Secondary | ICD-10-CM | POA: Diagnosis present

## 2022-06-13 DIAGNOSIS — Z823 Family history of stroke: Secondary | ICD-10-CM

## 2022-06-13 DIAGNOSIS — Z807 Family history of other malignant neoplasms of lymphoid, hematopoietic and related tissues: Secondary | ICD-10-CM

## 2022-06-13 DIAGNOSIS — G40109 Localization-related (focal) (partial) symptomatic epilepsy and epileptic syndromes with simple partial seizures, not intractable, without status epilepticus: Secondary | ICD-10-CM | POA: Diagnosis not present

## 2022-06-13 DIAGNOSIS — M21372 Foot drop, left foot: Secondary | ICD-10-CM | POA: Diagnosis present

## 2022-06-13 DIAGNOSIS — G40901 Epilepsy, unspecified, not intractable, with status epilepticus: Secondary | ICD-10-CM | POA: Diagnosis not present

## 2022-06-13 DIAGNOSIS — D72829 Elevated white blood cell count, unspecified: Secondary | ICD-10-CM | POA: Diagnosis present

## 2022-06-13 DIAGNOSIS — R0902 Hypoxemia: Secondary | ICD-10-CM | POA: Diagnosis present

## 2022-06-13 DIAGNOSIS — G35 Multiple sclerosis: Secondary | ICD-10-CM | POA: Diagnosis not present

## 2022-06-13 DIAGNOSIS — Z800 Family history of malignant neoplasm of digestive organs: Secondary | ICD-10-CM

## 2022-06-13 DIAGNOSIS — Z833 Family history of diabetes mellitus: Secondary | ICD-10-CM

## 2022-06-13 DIAGNOSIS — G8384 Todd's paralysis (postepileptic): Secondary | ICD-10-CM | POA: Diagnosis not present

## 2022-06-13 DIAGNOSIS — Z8249 Family history of ischemic heart disease and other diseases of the circulatory system: Secondary | ICD-10-CM

## 2022-06-13 DIAGNOSIS — T426X6A Underdosing of other antiepileptic and sedative-hypnotic drugs, initial encounter: Secondary | ICD-10-CM | POA: Diagnosis present

## 2022-06-13 DIAGNOSIS — Y92009 Unspecified place in unspecified non-institutional (private) residence as the place of occurrence of the external cause: Secondary | ICD-10-CM

## 2022-06-13 DIAGNOSIS — Z8261 Family history of arthritis: Secondary | ICD-10-CM | POA: Diagnosis not present

## 2022-06-13 DIAGNOSIS — G40919 Epilepsy, unspecified, intractable, without status epilepticus: Secondary | ICD-10-CM | POA: Diagnosis not present

## 2022-06-13 DIAGNOSIS — Z79899 Other long term (current) drug therapy: Secondary | ICD-10-CM | POA: Diagnosis not present

## 2022-06-13 DIAGNOSIS — F17290 Nicotine dependence, other tobacco product, uncomplicated: Secondary | ICD-10-CM | POA: Diagnosis present

## 2022-06-13 DIAGNOSIS — R531 Weakness: Secondary | ICD-10-CM

## 2022-06-13 LAB — GLUCOSE, CAPILLARY
Glucose-Capillary: 92 mg/dL (ref 70–99)
Glucose-Capillary: 93 mg/dL (ref 70–99)

## 2022-06-13 LAB — CBC WITH DIFFERENTIAL/PLATELET
Abs Immature Granulocytes: 0.07 10*3/uL (ref 0.00–0.07)
Basophils Absolute: 0 10*3/uL (ref 0.0–0.1)
Basophils Relative: 0 %
Eosinophils Absolute: 0 10*3/uL (ref 0.0–0.5)
Eosinophils Relative: 0 %
HCT: 43.3 % (ref 39.0–52.0)
Hemoglobin: 13.7 g/dL (ref 13.0–17.0)
Immature Granulocytes: 1 %
Lymphocytes Relative: 3 %
Lymphs Abs: 0.5 10*3/uL — ABNORMAL LOW (ref 0.7–4.0)
MCH: 31.6 pg (ref 26.0–34.0)
MCHC: 31.6 g/dL (ref 30.0–36.0)
MCV: 100 fL (ref 80.0–100.0)
Monocytes Absolute: 0.4 10*3/uL (ref 0.1–1.0)
Monocytes Relative: 3 %
Neutro Abs: 13.9 10*3/uL — ABNORMAL HIGH (ref 1.7–7.7)
Neutrophils Relative %: 93 %
Platelets: 268 10*3/uL (ref 150–400)
RBC: 4.33 MIL/uL (ref 4.22–5.81)
RDW: 13.8 % (ref 11.5–15.5)
WBC: 14.8 10*3/uL — ABNORMAL HIGH (ref 4.0–10.5)
nRBC: 0 % (ref 0.0–0.2)

## 2022-06-13 LAB — COMPREHENSIVE METABOLIC PANEL
ALT: 20 U/L (ref 0–44)
AST: 43 U/L — ABNORMAL HIGH (ref 15–41)
Albumin: 4.2 g/dL (ref 3.5–5.0)
Alkaline Phosphatase: 65 U/L (ref 38–126)
Anion gap: 13 (ref 5–15)
BUN: 7 mg/dL (ref 6–20)
CO2: 21 mmol/L — ABNORMAL LOW (ref 22–32)
Calcium: 8.7 mg/dL — ABNORMAL LOW (ref 8.9–10.3)
Chloride: 104 mmol/L (ref 98–111)
Creatinine, Ser: 1.01 mg/dL (ref 0.61–1.24)
GFR, Estimated: >60 mL/min (ref >60)
Glucose, Bld: 98 mg/dL (ref 70–99)
Potassium: 4.1 mmol/L (ref 3.5–5.1)
Sodium: 138 mmol/L (ref 135–145)
Total Bilirubin: 0.7 mg/dL (ref 0.3–1.2)
Total Protein: 7.1 g/dL (ref 6.5–8.1)

## 2022-06-13 LAB — I-STAT ARTERIAL BLOOD GAS, ED
Acid-base deficit: 4 mmol/L — ABNORMAL HIGH (ref 0.0–2.0)
Bicarbonate: 23.1 mmol/L (ref 20.0–28.0)
Calcium, Ion: 1.12 mmol/L — ABNORMAL LOW (ref 1.15–1.40)
HCT: 37 % — ABNORMAL LOW (ref 39.0–52.0)
Hemoglobin: 12.6 g/dL — ABNORMAL LOW (ref 13.0–17.0)
O2 Saturation: 100 %
Patient temperature: 99.5
Potassium: 3.7 mmol/L (ref 3.5–5.1)
Sodium: 139 mmol/L (ref 135–145)
TCO2: 25 mmol/L (ref 22–32)
pCO2 arterial: 51.3 mmHg — ABNORMAL HIGH (ref 32–48)
pH, Arterial: 7.263 — ABNORMAL LOW (ref 7.35–7.45)
pO2, Arterial: 510 mmHg — ABNORMAL HIGH (ref 83–108)

## 2022-06-13 LAB — RAPID URINE DRUG SCREEN, HOSP PERFORMED
Amphetamines: NOT DETECTED
Barbiturates: NOT DETECTED
Benzodiazepines: POSITIVE — AB
Cocaine: NOT DETECTED
Opiates: NOT DETECTED
Tetrahydrocannabinol: POSITIVE — AB

## 2022-06-13 LAB — MAGNESIUM: Magnesium: 1.8 mg/dL (ref 1.7–2.4)

## 2022-06-13 LAB — MRSA NEXT GEN BY PCR, NASAL: MRSA by PCR Next Gen: NOT DETECTED

## 2022-06-13 LAB — LACTIC ACID, PLASMA
Lactic Acid, Venous: 2.7 mmol/L (ref 0.5–1.9)
Lactic Acid, Venous: 2.3 mmol/L (ref 0.5–1.9)

## 2022-06-13 LAB — CBG MONITORING, ED: Glucose-Capillary: 108 mg/dL — ABNORMAL HIGH (ref 70–99)

## 2022-06-13 MED ORDER — SODIUM CHLORIDE 0.9 % IV SOLN
100.0000 mg | Freq: Two times a day (BID) | INTRAVENOUS | Status: DC
  Filled 2022-06-13 (×2): qty 10

## 2022-06-13 MED ORDER — POLYETHYLENE GLYCOL 3350 17 G PO PACK
17.0000 g | PACK | Freq: Every day | ORAL | Status: DC
  Administered 2022-06-14: 17 g
  Filled 2022-06-13: qty 1

## 2022-06-13 MED ORDER — DOCUSATE SODIUM 50 MG/5ML PO LIQD
100.0000 mg | Freq: Two times a day (BID) | ORAL | Status: DC
  Administered 2022-06-13 – 2022-06-14 (×3): 100 mg
  Filled 2022-06-13 (×4): qty 10

## 2022-06-13 MED ORDER — CHLORHEXIDINE GLUCONATE CLOTH 2 % EX PADS
6.0000 | MEDICATED_PAD | Freq: Every day | CUTANEOUS | Status: DC
  Administered 2022-06-13 – 2022-06-16 (×4): 6 via TOPICAL

## 2022-06-13 MED ORDER — LORAZEPAM 2 MG/ML IJ SOLN: 2.0000 mg | Freq: Once | INTRAMUSCULAR | Status: DC

## 2022-06-13 MED ORDER — PROPOFOL 1000 MG/100ML IV EMUL
0.0000 ug/kg/min | INTRAVENOUS | Status: DC
  Administered 2022-06-13 – 2022-06-14 (×3): 40 ug/kg/min via INTRAVENOUS
  Filled 2022-06-13 (×3): qty 100

## 2022-06-13 MED ORDER — SUCCINYLCHOLINE CHLORIDE 20 MG/ML IJ SOLN
INTRAMUSCULAR | Status: AC | PRN
  Administered 2022-06-13: 100 mg via INTRAVENOUS

## 2022-06-13 MED ORDER — DOCUSATE SODIUM 50 MG/5ML PO LIQD: 100.0000 mg | Freq: Two times a day (BID) | ORAL | Status: DC | PRN

## 2022-06-13 MED ORDER — SODIUM CHLORIDE 0.9 % IV SOLN
INTRAVENOUS | Status: AC | PRN
  Administered 2022-06-13: 1000 mL via INTRAVENOUS

## 2022-06-13 MED ORDER — LEVETIRACETAM IN NACL 1000 MG/100ML IV SOLN
1000.0000 mg | Freq: Once | INTRAVENOUS | Status: AC
  Administered 2022-06-13: 1000 mg via INTRAVENOUS

## 2022-06-13 MED ORDER — DOCUSATE SODIUM 100 MG PO CAPS
100.0000 mg | ORAL_CAPSULE | Freq: Two times a day (BID) | ORAL | Status: DC | PRN
Start: 2022-06-13 — End: 2022-06-13

## 2022-06-13 MED ORDER — SODIUM CHLORIDE 0.9 % IV SOLN
200.0000 mg | Freq: Once | INTRAVENOUS | Status: AC
  Administered 2022-06-13: 200 mg via INTRAVENOUS
  Filled 2022-06-13: qty 20

## 2022-06-13 MED ORDER — FENTANYL CITRATE PF 50 MCG/ML IJ SOSY
50.0000 ug | PREFILLED_SYRINGE | INTRAMUSCULAR | Status: DC | PRN
  Administered 2022-06-13: 50 ug via INTRAVENOUS
  Filled 2022-06-13: qty 1

## 2022-06-13 MED ORDER — LORAZEPAM 2 MG/ML IJ SOLN
INTRAMUSCULAR | Status: AC
  Filled 2022-06-13: qty 1

## 2022-06-13 MED ORDER — ETOMIDATE 2 MG/ML IV SOLN
INTRAVENOUS | Status: AC | PRN
  Administered 2022-06-13: 20 mg via INTRAVENOUS

## 2022-06-13 MED ORDER — SODIUM CHLORIDE 0.9 % IV SOLN
3000.0000 mg | Freq: Once | INTRAVENOUS | Status: AC
  Administered 2022-06-13: 3000 mg via INTRAVENOUS
  Filled 2022-06-13: qty 30

## 2022-06-13 MED ORDER — PROPOFOL 1000 MG/100ML IV EMUL
5.0000 ug/kg/min | INTRAVENOUS | Status: DC
  Administered 2022-06-13: 20 ug/kg/min via INTRAVENOUS

## 2022-06-13 MED ORDER — LEVETIRACETAM IN NACL 1000 MG/100ML IV SOLN
1000.0000 mg | Freq: Two times a day (BID) | INTRAVENOUS | Status: DC
  Administered 2022-06-14: 1000 mg via INTRAVENOUS
  Filled 2022-06-13: qty 100

## 2022-06-13 MED ORDER — SODIUM CHLORIDE 0.9 % IV SOLN
100.0000 mg | Freq: Two times a day (BID) | INTRAVENOUS | Status: DC
  Administered 2022-06-14 – 2022-06-15 (×3): 100 mg via INTRAVENOUS
  Filled 2022-06-13 (×4): qty 10

## 2022-06-13 MED ORDER — ENOXAPARIN SODIUM 30 MG/0.3ML IJ SOSY
30.0000 mg | PREFILLED_SYRINGE | INTRAMUSCULAR | Status: DC
  Administered 2022-06-13: 30 mg via SUBCUTANEOUS
  Filled 2022-06-13: qty 0.3

## 2022-06-13 MED ORDER — FENTANYL CITRATE PF 50 MCG/ML IJ SOSY
50.0000 ug | PREFILLED_SYRINGE | INTRAMUSCULAR | Status: DC | PRN
  Administered 2022-06-14: 100 ug via INTRAVENOUS
  Filled 2022-06-13: qty 2

## 2022-06-13 MED ORDER — POLYETHYLENE GLYCOL 3350 17 G PO PACK: 17.0000 g | PACK | Freq: Every day | ORAL | Status: DC | PRN

## 2022-06-13 MED ORDER — LEVETIRACETAM IN NACL 1000 MG/100ML IV SOLN: 1000.0000 mg | Freq: Two times a day (BID) | INTRAVENOUS | Status: DC

## 2022-06-13 NOTE — ED Notes (Signed)
Pharmacy & RT at bedside setting up to intubate pt.

## 2022-06-13 NOTE — ED Provider Notes (Signed)
[place] EMERGENCY DEPARTMENT AT [facility] Provider Note   CSN: [id number] Arrival date & time: 06/13/22  1414     History {Add pertinent medical, surgical, social history, OB history to HPI:1} Chief Complaint  Patient presents with   Seizures   AMS    Miguel Black is a 32 y.o. male.  HPI     Home Medications Prior to Admission medications   Medication Sig Start Date End Date Taking? Authorizing Provider  cholecalciferol (VITAMIN D3) 25 MCG (1000 UNIT) tablet Take 5,000 Units by mouth daily.    [provider]  diazePAM (VALTOCO 5 MG DOSE) 5 MG/0.1ML LIQD Place 1 spray into the nose daily as needed. 06/03/22   Lula Olszewski, MD  levETIRAcetam (KEPPRA) 1000 MG tablet Take 1 tablet (1,000 mg total) by mouth 2 (two) times daily. 01/05/22   Sater, Pearletha Furl, MD  OCREVUS 300 MG/10ML injection INFUSE 600MG  IN 0.9% NS INTRAVENOUSLY AT 40ML\HR AND INCREASE BY 40ML\HR EVERY 30 MINUTES (MAX 200ML\HR) OVER AT LEAST 3.5HRS AS DIRECTED EVERY 6 MONTHS. 06/07/21   Sater, Pearletha Furl, MD  zonisamide (ZONEGRAN) 100 MG capsule Take 2 capsules (200 mg total) by mouth daily. 04/27/22   Ghimire, Werner Lean, MD      Allergies    Dalfampridine    Review of Systems   Review of Systems  Physical Exam Updated Vital Signs BP 127/82   Pulse 95   Temp 99.5 F (37.5 C) (Axillary)   Resp 19   SpO2 100%  Physical Exam  ED Results / Procedures / Treatments   Labs (all labs ordered are listed, but only abnormal results are displayed) Labs Reviewed  CBG MONITORING, ED - Abnormal; Notable for the following components:      Result Value   Glucose-Capillary 108 (*)    All other components within normal limits  CBC WITH DIFFERENTIAL/PLATELET  COMPREHENSIVE METABOLIC PANEL  LACTIC ACID, PLASMA  LACTIC ACID, PLASMA  RAPID URINE DRUG SCREEN, HOSP PERFORMED  MAGNESIUM    EKG None  Radiology No results found.  Procedures Procedures  {Document cardiac  monitor, telemetry assessment procedure when appropriate:1}  Medications Ordered in ED Medications  levETIRAcetam (KEPPRA) IVPB 1000 mg/100 mL premix (1,000 mg Intravenous New Bag/Given 06/13/22 1447)  levETIRAcetam (KEPPRA) 3,000 mg in sodium chloride 0.9 % 250 mL IVPB (has no administration in time range)  LORazepam (ATIVAN) injection 2 mg (has no administration in time range)  propofol (DIPRIVAN) 1000 MG/100ML infusion (20 mcg/kg/min  67.9 kg Intravenous New Bag/Given 06/13/22 1453)  LORazepam (ATIVAN) 2 MG/ML injection (has no administration in time range)  etomidate (AMIDATE) injection (20 mg Intravenous Given 06/13/22 1440)  0.9 %  sodium chloride infusion (1,000 mLs Intravenous New Bag/Given 06/13/22 1438)  succinylcholine (ANECTINE) injection (100 mg Intravenous Given 06/13/22 1442)    ED Course/ Medical Decision Making/ A&P   {   Click here for ABCD2, HEART and other calculatorsREFRESH Note before signing :1}                          Medical Decision Making Amount and/or Complexity of Data Reviewed Labs: ordered. Radiology: ordered.  Risk Prescription drug management.   ***  {Document critical care time when appropriate:1} {Document review of labs and clinical decision tools ie heart score, Chads2Vasc2 etc:1}  {Document your independent review of radiology images, and any outside records:1} {Document your discussion with family members, caretakers, and with consultants:1} {Document social determinants  of health affecting pt's care:1} {Document your decision making why or why not admission, treatments were needed:1} Final Clinical Impression(s) / ED Diagnoses Final diagnoses:  None    Rx / DC Orders ED Discharge Orders     None

## 2022-06-13 NOTE — Progress Notes (Signed)
LTM EEG hooked up and running - no initial skin breakdown - push button tested - [facility] monitoring.  

## 2022-06-13 NOTE — ED Triage Notes (Signed)
Pt BIB [facility] from home where he was last seen at 0600 & mom found him on the floor at 1100. Hx of seizures & noncompliant with his Keppra. Mother reports she gave him Diazepam while he was on the ground "shaking" & once they had him in bed he never "came out of it" so at 1315 she called EMS. EMS reports seizure like activity & received a total of 10mg  Versed & post med had snoring resp so they put in NPA then used a NRB mask. EMS also reports that he was noted to have some decorticate posturing on the Lt side as well. He remained a GCS of 8 until the versed kicked in, does follow some commands. Last seizure reported one month ago today.

## 2022-06-13 NOTE — ED Notes (Signed)
Patient's mother called for update. Mom informed that patient is admitted to the hospital and intubated. Mom also requests assistance to understand what she can do to help him during seizures at home and what other options are available for medications so the patient will take the appropriately.

## 2022-06-13 NOTE — ED Notes (Signed)
Soft wrist restraints added to wrists, verbal order given by EDP Schlossman d/t pt maxed out on Propofol & still reaching for ETT.

## 2022-06-13 NOTE — H&P (Signed)
NAME:  Miguel Black, MRN:  [id number], DOB:  12-10-1990, LOS: 0 ADMISSION DATE:  06/13/2022, CONSULTATION DATE:  06/13/2022 REFERRING MD:  Dr. Dalene Seltzer, CHIEF COMPLAINT: Status epilepticus  History of Present Illness:  Miguel Black is a 32 y.o. male with a past medical history significant for seizures, prior status epilepticus, multiple sclerosis, and substance abuse who presented to the ED via EMS after being found unresponsive with seizure-like activity.  Per mother patient is noncompliant with Keppra.  She found patient on the floor at 11 AM "shaking" she administered diazepam per rectum when patient did not respond back to baseline EMS was called.  EMS administered an additional 10 mg of Versed.  Post Versed administration on ED arrival patient was able to follow some commands but later displayed recurrent seizure activity.  Patient was emergently intubated for airway protection and need for stat head CT.  PCCM consulted for further management.  Pertinent  Medical History  Seizures, prior status epilepticus, multiple sclerosis, and substance abuse   Significant Hospital Events: Including procedures, antibiotic start and stop dates in addition to other pertinent events   5/20 presented with status, intubated for airway protection on ED arrival.  Interim History / Subjective:  As above  Objective   Blood pressure 127/82, pulse 95, temperature 99.5 F (37.5 C), temperature source Axillary, resp. rate 19, SpO2 100 %.       No intake or output data in the 24 hours ending 06/13/22 1504 There were no vitals filed for this visit.  Examination: General: Acute ill-appearing adult male lying in bed on mechanical ventilator in no acute distress HEENT: ETT, MM pink/moist, PERRL,  Neuro: Sedated, withdrawals to pain CV: s1s2 regular rate and rhythm, no murmur, rubs, or gallops,  PULM:  Clear to auscultation, no increased work of breathing, no added breath sounds  GI: soft, bowel  sounds active in all 4 quadrants, non-tender, non-distended Extremities: warm/dry, no edema  Skin: no rashes or lesion  Resolved Hospital Problem list     Assessment & Plan:  Status epilepticus with history of seizure disorder Medication noncompliance  -Medication reconciliation pending but appears patient is on 1000 g of Keppra twice daily at baseline.  History of medication noncompliance History of MS P: Admit to ICU Neurology following, appreciate assistance Of DM per neurology Keppra and Vimpat neurology Seizure precautions Frequent neurochecks  Respiratory insufficiency in the setting of above -Per report patient had recurrent seizure-like activity prior to head CT prompting emergent intubation for airway protection. P: Minimize sedation as able, if seizure activity remains controlled can likely extubate soon Continue ventilator support with lung protective strategies  Wean PEEP and FiO2 for sats greater than 90%. Head of bed elevated 30 degrees. Plateau pressures less than 30 cm H20.  Follow intermittent chest x-ray and ABG.   SAT/SBT as tolerated, mentation preclude extubation  Ensure adequate pulmonary hygiene  Follow cultures  VAP bundle in place  PAD protocol  Leukocytosis  -Likely reactive, workup currently pending but no clinical signs of infection currently. P: Trend CBC and fever curve  Best Practice (right click and "Reselect all SmartList Selections" daily)   Diet/type: NPO DVT prophylaxis: LMWH GI prophylaxis: PPI Lines: N/A Foley:  N/A Code Status:  full code Last date of multidisciplinary goals of care discussion: Pending   Labs   CBC: No results for input(s): "WBC", "NEUTROABS", "HGB", "HCT", "MCV", "PLT" in the last 168 hours.  Basic Metabolic Panel: No results for input(s): "NA", "K", "CL", "CO2", "  GLUCOSE", "BUN", "CREATININE", "CALCIUM", "MG", "PHOS" in the last 168 hours. GFR: Estimated Creatinine Clearance: 102.7 mL/min (by C-G  formula based on SCr of 1.01 mg/dL). No results for input(s): "PROCALCITON", "WBC", "LATICACIDVEN" in the last 168 hours.  Liver Function Tests: No results for input(s): "AST", "ALT", "ALKPHOS", "BILITOT", "PROT", "ALBUMIN" in the last 168 hours. No results for input(s): "LIPASE", "AMYLASE" in the last 168 hours. No results for input(s): "AMMONIA" in the last 168 hours.  ABG    Component Value Date/Time   PHART 7.314 (L) 08/21/2021 1806   PCO2ART 43.0 08/21/2021 1806   PO2ART 163 (H) 08/21/2021 1806   HCO3 21.5 08/21/2021 1806   TCO2 23 08/21/2021 1806   ACIDBASEDEF 4.0 (H) 08/21/2021 1806   O2SAT 99 08/21/2021 1806     Coagulation Profile: No results for input(s): "INR", "PROTIME" in the last 168 hours.  Cardiac Enzymes: No results for input(s): "CKTOTAL", "CKMB", "CKMBINDEX", "TROPONINI" in the last 168 hours.  HbA1C: Hgb A1c MFr Bld  Date/Time Value Ref Range Status  08/22/2021 06:37 AM 5.5 4.8 - 5.6 % Final    Comment:    (NOTE) Pre diabetes:          5.7%-6.4%  Diabetes:              >6.4%  Glycemic control for   <7.0% adults with diabetes     CBG: Recent Labs  Lab 06/13/22 1424  GLUCAP 108*    Review of Systems:   Unable to assess  Past Medical History:  He,  has a past medical history of Acute encephalopathy (04/26/2022), Acute metabolic encephalopathy (04/26/2022), Acute respiratory failure with hypoxia (HCC) (08/23/2021), Aspiration pneumonia (HCC) (07/12/2018), Closed fracture of distal phalanx or phalanges of hand (02/05/2009), Encounter for screening for HIV (07/12/2018), Foot drop (06/03/2022), Marijuana abuse (05/01/2013), MS (multiple sclerosis) (HCC), Seizure (HCC), Seizures (HCC) (01/22/2019), and Status epilepticus (HCC) (07/12/2018).   Surgical History:   Past Surgical History:  Procedure Laterality Date   NO PAST SURGERIES       Social History:   reports that he has been smoking cigars. He has never used smokeless tobacco. He reports  current alcohol use. He reports current drug use. Drug: Marijuana.   Family History:  His family history includes Arthritis in his maternal grandmother and mother; Colon cancer (age of onset: 81) in his maternal uncle; Diabetes in his maternal grandmother and paternal grandmother; Healthy in his brother, father, and sister; Hodgkin's lymphoma in his brother; Hypertension in his maternal grandfather, maternal grandmother, and mother; Kidney disease in his maternal grandmother; Stroke in his maternal grandfather.   Allergies Allergies  Allergen Reactions   Dalfampridine Other (See Comments)    Seizure 02/2021 while on dalfampridine     Home Medications  Prior to Admission medications   Medication Sig Start Date End Date Taking? Authorizing Provider  cholecalciferol (VITAMIN D3) 25 MCG (1000 UNIT) tablet Take 5,000 Units by mouth daily.    [provider]  diazePAM (VALTOCO 5 MG DOSE) 5 MG/0.1ML LIQD Place 1 spray into the nose daily as needed. 06/03/22   Lula Olszewski, MD  levETIRAcetam (KEPPRA) 1000 MG tablet Take 1 tablet (1,000 mg total) by mouth 2 (two) times daily. 01/05/22   Sater, Pearletha Furl, MD  OCREVUS 300 MG/10ML injection INFUSE 600MG  IN 0.9% NS INTRAVENOUSLY AT 40ML\HR AND INCREASE BY 40ML\HR EVERY 30 MINUTES (MAX 200ML\HR) OVER AT LEAST 3.5HRS AS DIRECTED EVERY 6 MONTHS. 06/07/21   Sater, Pearletha Furl, MD  zonisamide (  ZONEGRAN) 100 MG capsule Take 2 capsules (200 mg total) by mouth daily. 04/27/22   Ghimire, Werner Lean, MD     Critical care time:    Performed by: Caryl Manas D. Harris  Total critical care time: 40 minutes  Critical care time was exclusive of separately billable procedures and treating other patients.  Critical care was necessary to treat or prevent imminent or life-threatening deterioration.  Critical care was time spent personally by me on the following activities: development of treatment plan with patient and/or surrogate as well as nursing,  discussions with consultants, evaluation of patient's response to treatment, examination of patient, obtaining history from patient or surrogate, ordering and performing treatments and interventions, ordering and review of laboratory studies, ordering and review of radiographic studies, pulse oximetry and re-evaluation of patient's condition.  Kaela Beitz D. Harris, NP-C [facility] Personal contact information can be found on Amion  If no contact or response made please call [phone] 06/13/2022, 3:58 PM

## 2022-06-13 NOTE — Consult Note (Addendum)
Neurology Consultation  Reason for Consult: Seizure activity and left arm weakness Referring Physician: Dr. Dalene Seltzer  CC: None  History is obtained from: EMS and chart  HPI: Miguel Black is a 32 y.o. male with history of seizures, status epilepticus, substance abuse and relapsing remitting MS who was admitted after having a seizure at home at about 11:00 AM.  He his family administered Diastat when he had this seizure, and noticed that he did not wake up normally and called EMS around 1 PM.  Upon arrival to the emergency department, patient was noted to be very lethargic and unable to protect his airway.  He was intubated and had another seizure consisting of left-sided twitching with bowel and bladder incontinence during intubation.  He has been given 2mg  IV Ativan and was loaded with 4 g of Keppra.  It was noted that patient did receive a 90-day fill of his Keppra and zonisamide last month, but his Keppra level on 5/10 was less than 2.  ROS: Unable to obtain due to altered mental status.   Past Medical History:  Diagnosis Date   Acute encephalopathy 04/26/2022   Acute metabolic encephalopathy 04/26/2022   Acute respiratory failure with hypoxia (HCC) 08/23/2021   Aspiration pneumonia (HCC) 07/12/2018   Closed fracture of distal phalanx or phalanges of hand 02/05/2009   Formatting of this note might be different from the original. Overview: The original diagnosis was replaced automatically by the ICD-10 team to an ICD-10 compliant one. Please review for accuracy   Encounter for screening for HIV 07/12/2018   Foot drop 06/03/2022   Has a left foot drop ever since 2015 first flare of multiple sclerosis had a brace in 2022 custom but does not actually do anything to lift the from the foot physical therapist advised him to get an alternative foot drop brace but I cannot find record of that   Marijuana abuse 05/01/2013   MS (multiple sclerosis) (HCC)    Seizure (HCC)    Seizures (HCC)  01/22/2019   Status epilepticus (HCC) 07/12/2018     Family History  Problem Relation Age of Onset   Arthritis Mother    Hypertension Mother    Healthy Father    Healthy Sister    Healthy Brother    Hodgkin's lymphoma Brother    Colon cancer Maternal Uncle 45   Kidney disease Maternal Grandmother    Hypertension Maternal Grandmother    Arthritis Maternal Grandmother    Diabetes Maternal Grandmother    Stroke Maternal Grandfather    Hypertension Maternal Grandfather    Diabetes Paternal Grandmother      Social History:   reports that he has been smoking cigars. He has never used smokeless tobacco. He reports current alcohol use. He reports current drug use. Drug: Marijuana.  Medications  Current Facility-Administered Medications:    levETIRAcetam (KEPPRA) 3,000 mg in sodium chloride 0.9 % 250 mL IVPB, 3,000 mg, Intravenous, Once, Alvira Monday, MD   Melene Muller ON 06/14/2022] levETIRAcetam (KEPPRA) IVPB 1000 mg/100 mL premix, 1,000 mg, Intravenous, Q12H, de La Torre, Cortney E, NP   LORazepam (ATIVAN) 2 MG/ML injection, , , ,    LORazepam (ATIVAN) injection 2 mg, 2 mg, Intravenous, Once, Schlossman, Erin, MD   propofol (DIPRIVAN) 1000 MG/100ML infusion, 5-80 mcg/kg/min, Intravenous, Continuous, Schlossman, Erin, MD, Last Rate: 28.5 mL/hr at 06/13/22 1501, 70 mcg/kg/min at 06/13/22 1501  Current Outpatient Medications:    cholecalciferol (VITAMIN D3) 25 MCG (1000 UNIT) tablet, Take 5,000 Units by mouth daily.,  Disp: , Rfl:    diazePAM (VALTOCO 5 MG DOSE) 5 MG/0.1ML LIQD, Place 1 spray into the nose daily as needed., Disp: 1 each, Rfl: 1   levETIRAcetam (KEPPRA) 1000 MG tablet, Take 1 tablet (1,000 mg total) by mouth 2 (two) times daily., Disp: 180 tablet, Rfl: 4   OCREVUS 300 MG/10ML injection, INFUSE 600MG  IN 0.9% NS INTRAVENOUSLY AT 40ML\HR AND INCREASE BY 40ML\HR EVERY 30 MINUTES (MAX 200ML\HR) OVER AT LEAST 3.5HRS AS DIRECTED EVERY 6 MONTHS., Disp: 20 mL, Rfl: 1    zonisamide (ZONEGRAN) 100 MG capsule, Take 2 capsules (200 mg total) by mouth daily., Disp: 90 capsule, Rfl: 1   Exam: Current vital signs: BP 127/82   Pulse 95   Temp 99.5 F (37.5 C) (Axillary)   Resp 19   SpO2 100%  Vital signs in last 24 hours: Temp:  [99.5 F (37.5 C)] 99.5 F (37.5 C) (05/20 1437) Pulse Rate:  [95] 95 (05/20 1421) Resp:  [17-19] 19 (05/20 1421) BP: (127)/(82) 127/82 (05/20 1420) SpO2:  [100 %] 100 % (05/20 1424)  GENERAL: Obtunded patient, localizes noxious stimuli only, no verbal responses Head: Normocephalic and atraumatic, without obvious abnormality CV: Regular rate and rhythm on telemetry Extremities: warm, well perfused, without obvious deformity  NEURO:  Mental Status: Lethargic and responsive only to noxious stimuli, able to localize but cannot follow commands He is not able to provide a clear and coherent history of present illness. Patient is nonverbal No neglect is noted Cranial Nerves:  II: PERRL III, IV, VI: Left-sided exotropia VII: Face is symmetric resting  XII: Uncooperative with tongue protrusion Motor: Able to localize noxious stimuli with right upper extremity, withdraws left upper extremity to noxious, withdraws bilateral lower extremities to noxious Tone is normal. Bulk is normal.  Sensation: Intact to noxious stimuli throughout Coordination: Unable to perform Gait: Deferred   Labs I have reviewed labs in epic and the results pertinent to this consultation are:   CBC    Component Value Date/Time   WBC 7.7 06/03/2022 1601   RBC 4.63 06/03/2022 1601   HGB 14.6 06/03/2022 1601   HGB 16.9 01/05/2022 0936   HCT 42.7 06/03/2022 1601   HCT 49.2 01/05/2022 0936   PLT 278 06/03/2022 1601   PLT 257 01/05/2022 0936   MCV 92.2 06/03/2022 1601   MCV 93 01/05/2022 0936   MCH 31.5 06/03/2022 1601   MCHC 34.2 06/03/2022 1601   RDW 13.1 06/03/2022 1601   RDW 13.5 01/05/2022 0936   LYMPHSABS 1,786 06/03/2022 1601   LYMPHSABS  1.6 01/05/2022 0936   MONOABS 0.5 04/25/2022 1940   EOSABS 54 06/03/2022 1601   EOSABS 0.1 01/05/2022 0936   BASOSABS 39 06/03/2022 1601   BASOSABS 0.0 01/05/2022 0936    CMP     Component Value Date/Time   NA 136 06/03/2022 1601   NA 140 07/22/2019 1502   K 4.0 06/03/2022 1601   CL 101 06/03/2022 1601   CO2 24 06/03/2022 1601   GLUCOSE 92 06/03/2022 1601   BUN 14 06/03/2022 1601   BUN 12 07/22/2019 1502   CREATININE 1.01 06/03/2022 1601   CALCIUM 9.7 06/03/2022 1601   PROT 7.3 06/03/2022 1601   PROT 7.2 07/22/2019 1502   ALBUMIN 3.7 04/27/2022 0955   ALBUMIN 4.9 07/22/2019 1502   AST 13 06/03/2022 1601   ALT 15 06/03/2022 1601   ALKPHOS 65 04/27/2022 0955   BILITOT 0.5 06/03/2022 1601   BILITOT <0.2 07/22/2019 1502   GFRNONAA >  60 04/27/2022 0955   GFRAA 99 07/22/2019 1502    Lipid Panel     Component Value Date/Time   CHOL 199 06/03/2022 1601   TRIG 77 06/03/2022 1601   HDL 69 06/03/2022 1601   CHOLHDL 2.9 06/03/2022 1601   LDLCALC 113 (H) 06/03/2022 1601   Keppra level 5/10 less than 2 Keppra level 5/20 pending  Imaging I have reviewed the images obtained:  CT-scan of the brain: No ICH or other acute abnormalities  Assessment: 32 year old patient with history of seizures, status epilepticus, substance abuse and relapsing remitting MS was admitted after having a seizure at home for which Diastat was administered and not waking up 2 hours afterwards.  He was intubated in the emergency department for being unable to protect his airway and had further seizure activity during intubation consisting of left-sided twitching as well as bowel and bladder incontinence.  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  CT head shows no ICH or other acute abnormalities.  Patient has questionable compliance with home AEDs.  He did fill his Keppra and zonisamide last month, but his Keppra level 10 days ago was low.  Will  repeat Keppra level.  Impression: Breakthrough seizure activity in patient with history of seizures and questionable compliance with AEDs  Recommendations: -Agree with 2 mg of Ativan IV once and 4 g Keppra load -Long-term EEG -Continue Keppra 1 g every 12 -Zonisamide cannot be given per tube, will start Vimpat 100 mg twice daily -Lorazepam 2 to 4 mg IV for further seizure activity -Agree with sedation via propofol -Seizure precautions -Discussed [employer] statutes, patients with seizures are not allowed to drive until they have been seizure-free for six months Use caution when using heavy equipment or power tools. Avoid working on ladders or at heights. Take showers instead of baths. Ensure the water temperature is not too high on the home water heater. Do not go swimming alone. Do not lock yourself in a room alone (i.e. bathroom). When caring for infants or small children, sit down when holding, feeding, or changing them to minimize risk of injury to the child in the event you have a seizure. Maintain good sleep hygiene. Avoid alcohol.    Pt seen by NP/Neuro and later by MD. Note/plan to be edited by MD as needed.  Cortney E Ernestina Columbia , MSN, AGACNP-BC [facility] See Amion for schedule and pager information 06/13/2022 3:06 PM    Attending Neurohospitalist Addendum Patient seen and examined with APP/Resident. Agree with the history and physical as documented above. Agree with the plan as documented, which I helped formulate. I have edited the note above to reflect my full findings and recommendations. I have independently reviewed the chart, obtained history, review of systems and examined the patient.I have personally reviewed pertinent head/neck/spine imaging (CT/MRI). Please feel free to call with any questions.  This patient is critically ill and at significant risk of neurological worsening, death and care requires constant monitoring of vital signs,  hemodynamics,respiratory and cardiac monitoring, neurological assessment, discussion with family, other specialists and medical decision making of high complexity. I spent 45 minutes of neurocritical care time  in the care of  this patient. This was time spent independent of any time provided by nurse practitioner or PA.  Bing Neighbors, MD [facility] [phone]  If 7pm- 7am, please page neurology on call as listed in AMION.'

## 2022-06-13 NOTE — Progress Notes (Signed)
Pt transported from ED RESUS to 3M07 with no complications noted.

## 2022-06-13 NOTE — Progress Notes (Signed)
Py is unavail. He is being moved to 57m07

## 2022-06-13 NOTE — ED Notes (Signed)
Neurology at bedside with Dr. Dalene Seltzer.

## 2022-06-13 NOTE — Progress Notes (Signed)
Pt transported from ED RESUS to CT and back with no complications noted.

## 2022-06-14 ENCOUNTER — Telehealth (HOSPITAL_COMMUNITY): Payer: Self-pay | Admitting: Pharmacy Technician

## 2022-06-14 ENCOUNTER — Ambulatory Visit: Payer: Medicare (Managed Care)

## 2022-06-14 ENCOUNTER — Other Ambulatory Visit (HOSPITAL_COMMUNITY): Payer: Self-pay

## 2022-06-14 DIAGNOSIS — G40901 Epilepsy, unspecified, not intractable, with status epilepticus: Secondary | ICD-10-CM | POA: Diagnosis not present

## 2022-06-14 DIAGNOSIS — G40919 Epilepsy, unspecified, intractable, without status epilepticus: Secondary | ICD-10-CM

## 2022-06-14 LAB — BASIC METABOLIC PANEL
Anion gap: 10 (ref 5–15)
BUN: 6 mg/dL (ref 6–20)
CO2: 24 mmol/L (ref 22–32)
Calcium: 8.4 mg/dL — ABNORMAL LOW (ref 8.9–10.3)
Chloride: 107 mmol/L (ref 98–111)
Creatinine, Ser: 0.95 mg/dL (ref 0.61–1.24)
GFR, Estimated: >60 mL/min (ref >60)
Glucose, Bld: 97 mg/dL (ref 70–99)
Potassium: 3.6 mmol/L (ref 3.5–5.1)
Sodium: 141 mmol/L (ref 135–145)

## 2022-06-14 LAB — GLUCOSE, CAPILLARY
Glucose-Capillary: 95 mg/dL (ref 70–99)
Glucose-Capillary: 113 mg/dL — ABNORMAL HIGH (ref 70–99)
Glucose-Capillary: 95 mg/dL (ref 70–99)
Glucose-Capillary: 122 mg/dL — ABNORMAL HIGH (ref 70–99)

## 2022-06-14 LAB — CBC
HCT: 37.5 % — ABNORMAL LOW (ref 39.0–52.0)
Hemoglobin: 12.4 g/dL — ABNORMAL LOW (ref 13.0–17.0)
MCH: 31.2 pg (ref 26.0–34.0)
MCHC: 33.1 g/dL (ref 30.0–36.0)
MCV: 94.5 fL (ref 80.0–100.0)
Platelets: 240 10*3/uL (ref 150–400)
RBC: 3.97 MIL/uL — ABNORMAL LOW (ref 4.22–5.81)
RDW: 13.8 % (ref 11.5–15.5)
WBC: 7.8 10*3/uL (ref 4.0–10.5)
nRBC: 0 % (ref 0.0–0.2)

## 2022-06-14 LAB — LEVETIRACETAM LEVEL: Levetiracetam Lvl: <2 ug/mL — ABNORMAL LOW (ref 10.0–40.0)

## 2022-06-14 LAB — TRIGLYCERIDES: Triglycerides: 153 mg/dL — ABNORMAL HIGH (ref <150)

## 2022-06-14 LAB — MAGNESIUM: Magnesium: 2 mg/dL (ref 1.7–2.4)

## 2022-06-14 MED ORDER — BRIVARACETAM 10 MG/ML PO SOLN: 100.0000 mg | Freq: Two times a day (BID) | ORAL | Status: DC

## 2022-06-14 MED ORDER — SODIUM CHLORIDE 0.9 % IV SOLN
100.0000 mg | Freq: Two times a day (BID) | INTRAVENOUS | Status: DC
  Filled 2022-06-14 (×2): qty 10

## 2022-06-14 MED ORDER — PANTOPRAZOLE SODIUM 40 MG IV SOLR
40.0000 mg | INTRAVENOUS | Status: DC
  Administered 2022-06-14: 40 mg via INTRAVENOUS
  Filled 2022-06-14: qty 10

## 2022-06-14 MED ORDER — BRIVARACETAM 100 MG PO TABS: 100.0000 mg | ORAL_TABLET | Freq: Two times a day (BID) | ORAL | Status: DC

## 2022-06-14 MED ORDER — SODIUM CHLORIDE 0.9 % IV SOLN
100.0000 mg | Freq: Two times a day (BID) | INTRAVENOUS | Status: DC
  Filled 2022-06-14: qty 10

## 2022-06-14 MED ORDER — BRIVARACETAM 100 MG PO TABS
100.0000 mg | ORAL_TABLET | Freq: Two times a day (BID) | ORAL | Status: DC
  Administered 2022-06-14 – 2022-06-16 (×4): 100 mg via ORAL
  Filled 2022-06-14 (×4): qty 1

## 2022-06-14 MED ORDER — MIDAZOLAM HCL 2 MG/2ML IJ SOLN: 2.0000 mg | INTRAMUSCULAR | Status: DC | PRN

## 2022-06-14 MED ORDER — ENOXAPARIN SODIUM 40 MG/0.4ML IJ SOSY
40.0000 mg | PREFILLED_SYRINGE | INTRAMUSCULAR | Status: DC
  Administered 2022-06-14 – 2022-06-15 (×2): 40 mg via SUBCUTANEOUS
  Filled 2022-06-14 (×2): qty 0.4

## 2022-06-14 NOTE — Telephone Encounter (Signed)
Patient Advocate Encounter   Received notification that prior authorization for Briviact 100MG  tablets is required.   PA submitted on 06/14/2022 [facility] Insurance [facility] Medicare Electronic Prior Authorization Request Form Status is pending       Roland Earl, CPhT Pharmacy Patient Advocate Specialist [facility] Patient Advocate Team Direct Number: [phone]  Fax: [phone]

## 2022-06-14 NOTE — Progress Notes (Addendum)
NAME:  Miguel Black, MRN:  [id number], DOB:  1990-02-26, LOS: 1 ADMISSION DATE:  06/13/2022, CONSULTATION DATE:  06/13/2022 REFERRING MD:  Dr. Dalene Seltzer, CHIEF COMPLAINT: Status epilepticus  History of Present Illness:  Miguel Black is a 32 y.o. male with a past medical history significant for seizures, prior status epilepticus, multiple sclerosis, and substance abuse who presented to the ED via EMS after being found unresponsive with seizure-like activity.  Per mother patient is noncompliant with Keppra.  She found patient on the floor at 11 AM "shaking" she administered diazepam per rectum when patient did not respond back to baseline EMS was called.  EMS administered an additional 10 mg of Versed.  Post Versed administration on ED arrival patient was able to follow some commands but later displayed recurrent seizure activity.  Patient was emergently intubated for airway protection and need for stat head CT.  PCCM consulted for further management.  Pertinent  Medical History  Seizures, prior status epilepticus, multiple sclerosis, and substance abuse   Significant Hospital Events: Including procedures, antibiotic start and stop dates in addition to other pertinent events   5/20 presented with status, intubated for airway protection on ED arrival.  Interim History / Subjective:  Admitted overnight. No acute issues  Starting continuous EEG now  No obvious seizure activity overnight   Objective   Blood pressure 111/68, pulse 67, temperature 98.6 F (37 C), temperature source Axillary, resp. rate 20, weight 70.8 kg, SpO2 100 %.    Vent Mode: PRVC FiO2 (%):  [40 %-100 %] 40 % Set Rate:  [20 bmp] 20 bmp Vt Set:  [560 mL] 560 mL PEEP:  [5 cmH20] 5 cmH20 Pressure Support:  [5 cmH20] 5 cmH20 Plateau Pressure:  [15 cmH20-19 cmH20] 16 cmH20   Intake/Output Summary (Last 24 hours) at 06/14/2022 4098 Last data filed at 06/14/2022 0600 Gross per 24 hour  Intake 358.29 ml  Output 1035  ml  Net -676.71 ml   Filed Weights   06/14/22 0500  Weight: 70.8 kg    Examination: General: Acutely ill-appearing adult male, NAD on vent  HEENT: ETT, MM pink/moist, PERRL 4mm, sluggish, equal  Neuro: Sedated, RASS -1, openes eyes to voice, tracks, follows commands with R hand, no movement L hand or BLE  CV: s1s2 regular rate and rhythm, no murmur, rubs, or gallops,  PULM:  resps even non labored on vent, clear  GI: soft, bowel sounds active in all 4 quadrants, non-tender, non-distended Extremities: warm/dry, no edema  Skin: no rashes or lesion  Resolved Hospital Problem list     Assessment & Plan:  Status epilepticus with history of seizure disorder Medication noncompliance  -Medication reconciliation pending but appears patient is on 1000g of Keppra twice daily at baseline.  History of medication noncompliance History of MS P: Neurology following, appreciate assistance Keppra and Vimpat per neurology/pharmacy  Seizure precautions Frequent neurochecks  Respiratory insufficiency in the setting of above -Per report patient had recurrent seizure-like activity prior to head CT prompting emergent intubation for airway protection. P: Wean sedation as able for RASS 0, if seizure activity remains controlled and ok with neuro can likely wean/extubate soon - EEG pending Continue ventilator support with lung protective strategies  Wean PEEP and FiO2 for sats greater than 90%. Head of bed elevated 30 degrees. Follow intermittent chest x-ray and ABG.   SBT as tolerated, mentation preclude extubation  Ensure adequate pulmonary hygiene  Follow cultures  VAP bundle in place  PAD protocol  Leukocytosis  -  Likely reactive, workup currently pending but no clinical signs of infection currently. P: Trend CBC and fever curve  Best Practice (right click and "Reselect all SmartList Selections" daily)   Diet/type: NPO DVT prophylaxis: LMWH GI prophylaxis: PPI Lines: N/A Foley:   N/A Code Status:  full code Last date of multidisciplinary goals of care discussion: Pending   Labs   CBC: Recent Labs  Lab 06/13/22 1427 06/13/22 1523  WBC 14.8*  --   NEUTROABS 13.9*  --   HGB 13.7 12.6*  HCT 43.3 37.0*  MCV 100.0  --   PLT 268  --     Basic Metabolic Panel: Recent Labs  Lab 06/13/22 1427 06/13/22 1523  NA 138 139  K 4.1 3.7  CL 104  --   CO2 21*  --   GLUCOSE 98  --   BUN 7  --   CREATININE 1.01  --   CALCIUM 8.7*  --   MG 1.8  --    GFR: Estimated Creatinine Clearance: 106.9 mL/min (by C-G formula based on SCr of 1.01 mg/dL). Recent Labs  Lab 06/13/22 1427 06/13/22 1533 06/13/22 1829  WBC 14.8*  --   --   LATICACIDVEN  --  2.7* 2.3*    Liver Function Tests: Recent Labs  Lab 06/13/22 1427  AST 43*  ALT 20  ALKPHOS 65  BILITOT 0.7  PROT 7.1  ALBUMIN 4.2   No results for input(s): "LIPASE", "AMYLASE" in the last 168 hours. No results for input(s): "AMMONIA" in the last 168 hours.  ABG    Component Value Date/Time   PHART 7.263 (L) 06/13/2022 1523   PCO2ART 51.3 (H) 06/13/2022 1523   PO2ART 510 (H) 06/13/2022 1523   HCO3 23.1 06/13/2022 1523   TCO2 25 06/13/2022 1523   ACIDBASEDEF 4.0 (H) 06/13/2022 1523   O2SAT 100 06/13/2022 1523     Coagulation Profile: No results for input(s): "INR", "PROTIME" in the last 168 hours.  Cardiac Enzymes: No results for input(s): "CKTOTAL", "CKMB", "CKMBINDEX", "TROPONINI" in the last 168 hours.  HbA1C: Hgb A1c MFr Bld  Date/Time Value Ref Range Status  08/22/2021 06:37 AM 5.5 4.8 - 5.6 % Final    Comment:    (NOTE) Pre diabetes:          5.7%-6.4%  Diabetes:              >6.4%  Glycemic control for   <7.0% adults with diabetes     CBG: Recent Labs  Lab 06/13/22 1424 06/13/22 1955 06/13/22 2330 06/14/22 0341 06/14/22 0746  GLUCAP 108* 93 92 95 113*    Critical care time:    Performed by: Danford Bad  Total critical care time: 32 minutes  Critical care  time was exclusive of separately billable procedures and treating other patients.  Critical care was necessary to treat or prevent imminent or life-threatening deterioration.  Critical care was time spent personally by me on the following activities: development of treatment plan with patient and/or surrogate as well as nursing, discussions with consultants, evaluation of patient's response to treatment, examination of patient, obtaining history from patient or surrogate, ordering and performing treatments and interventions, ordering and review of laboratory studies, ordering and review of radiographic studies, pulse oximetry and re-evaluation of patient's condition.   Dirk Dress, NP Pulmonary/Critical Care Medicine  06/14/2022  9:28 AM      I agree with the Advanced Practitioner's note, impression, and recommendations as outlined. I have taken an independent interval history, reviewed  the chart and examined the patient.  My medical decision making is as follows:   Subjective: 32 yo with seizures disorder here with breakthrough seizures  Objective: Blood pressure 100/69, pulse (!) 56, temperature 98.4 F (36.9 C), temperature source Axillary, resp. rate 20, weight 70.8 kg, SpO2 100 %.  Gen:      Intubated, sedated, acutely ill appearing HEENT:  ETT to vent Lungs:    sounds of mechanical ventilation auscultated no wheeze CV:         RRR Abd:      + bowel sounds; soft, non-tender; no palpable masses, no distension Ext:    No edema Skin:      Warm and dry; no rashes Neuro:   sedated, opens eyes, tracks, intermittently follows commands   Labs/Imaging: Na 141 K 3.6 Cr 0.95 WBC 7.8 Hgb 12.4   Assessment and Plan:  Acute encephalopathy secondary to  Seizure disorder with breakthrough seizures secondary to Medication nonadherence.  Acute hypoxemic respiratory failure secondary to above   EEG reviewed - no evidence of seizures. Discussed with neurology. Plans to stop  propofol, lighten sedation and switch to precedex, try to extubate.   The patient is critically ill due to encephalopathy/seizures with multiple organ systems failure and requires high complexity decision making for assessment and support, frequent evaluation and titration of therapies, application of advanced monitoring technologies and extensive interpretation of multiple databases.   Critical Care Time devoted to patient care services described in this note is 33 minutes. This time reflects time of care of this signee Charlott Holler . This critical care time does not reflect separately billable procedures or procedure time, teaching time and supervisory time of PA/NP/Med student/Med Resident etc but could involve care discussion time.  Charlott Holler [place] Pulmonary and Critical Care Medicine 06/14/2022 1:15 PM  Pager: see AMION  If no response to pager, please call critical care on call (see AMION) until 7pm After 7:00 pm call Elink

## 2022-06-14 NOTE — Procedures (Signed)
Extubation Procedure Note  Patient Details:   Name: Miguel Black DOB: 07-04-1990 MRN: [id number]   Airway Documentation:    Vent end date: 06/14/22 Vent end time: 1419   Evaluation  O2 sats: stable throughout Complications: No apparent complications Patient did tolerate procedure well. Bilateral Breath Sounds: Clear, Diminished   Yes Positive cuff leak prior to extubation. Pt extubated to 4L . Vitals stable throughout procedure.  Lajean Manes 06/14/2022, 2:19 PM

## 2022-06-14 NOTE — Progress Notes (Addendum)
Subjective: No further seizures overnight.  Mother at bedside states patient has had significant irritability since being on Keppra and does not like taking it.  His Keppra levels are less than 2 also suggesting noncompliance.  ROS: Unable to obtain due to intubation  Examination  Vital signs in last 24 hours: Temp:  [98.3 F (36.8 C)-99.7 F (37.6 C)] 98.4 F (36.9 C) (05/21 1126) Pulse Rate:  [56-122] 79 (05/21 1418) Resp:  [16-46] 18 (05/21 1418) BP: (100-141)/(64-98) 100/69 (05/21 1126) SpO2:  [93 %-100 %] 98 % (05/21 1418) FiO2 (%):  [40 %] 40 % (05/21 1357) Weight:  [70.8 kg] 70.8 kg (05/21 0500)  General: lying in bed, NAD Neuro: On propofol at 10, awake, alert, follows commands, PERRLA, EOMI, antigravity strength in all extremities  Basic Metabolic Panel: Recent Labs  Lab 06/13/22 1427 06/13/22 1523 06/14/22 0841  NA 138 139 141  K 4.1 3.7 3.6  CL 104  --  107  CO2 21*  --  24  GLUCOSE 98  --  97  BUN 7  --  6  CREATININE 1.01  --  0.95  CALCIUM 8.7*  --  8.4*  MG 1.8  --  2.0    CBC: Recent Labs  Lab 06/13/22 1427 06/13/22 1523 06/14/22 0841  WBC 14.8*  --  7.8  NEUTROABS 13.9*  --   --   HGB 13.7 12.6* 12.4*  HCT 43.3 37.0* 37.5*  MCV 100.0  --  94.5  PLT 268  --  240     Coagulation Studies: No results for input(s): "LABPROT", "INR" in the last 72 hours.  Imaging CT head without contrast 06/13/2022: No acute intracranial abnormality.   ASSESSMENT AND PLAN: 32 year old male with relapsing imaging MS and epilepsy presented with breakthrough seizures in the setting of medication noncompliance.  Epilepsy with breakthrough seizure Multiple sclerosis -Breakthrough seizure in the setting of medication noncompliance  Recommendations -Wean propofol at 10 mcg/h to stop -Will switch Keppra to Briviact 100 mg twice daily due to significant irritability - Continue Vimpat 100 mg twice daily -Can DC LTM tomorrow if no seizures overnight -Can try to  switch to monotherapy as an outpatient if patient remains seizure-free -Seizure precautions -As needed IV Versed for clinical seizures  CRITICAL CARE Performed by: Charlsie Quest   Total critical care time: 35 minutes  Critical care time was exclusive of separately billable procedures and treating other patients.  Critical care was necessary to treat or prevent imminent or life-threatening deterioration.  Critical care was time spent personally by me on the following activities: development of treatment plan with patient and/or surrogate as well as nursing, discussions with consultants, evaluation of patient's response to treatment, examination of patient, obtaining history from patient or surrogate, ordering and performing treatments and interventions, ordering and review of laboratory studies, ordering and review of radiographic studies, pulse oximetry and re-evaluation of patient's condition.     Lindie Spruce Epilepsy [facility] For questions after 5pm please refer to AMION to reach the Neurologist on call

## 2022-06-14 NOTE — Telephone Encounter (Signed)
Patient Advocate Encounter  Prior Authorization for Briviact 100MG  tablets  has been approved.    PA# [id number] Insurance [facility] Medicare Electronic Prior Authorization Request Form  Effective dates: 06/14/2022 through 01/23/2098  Patients co-pay is $0.00.     Roland Earl, CPhT Pharmacy Patient Advocate Specialist [facility] Patient Advocate Team Direct Number: [phone]  Fax: [phone]

## 2022-06-14 NOTE — Progress Notes (Signed)
eLink Physician-Brief Progress Note Patient Name: Miguel Black DOB: December 14, 1990 MRN: [id number]   Date of Service  06/14/2022  HPI/Events of Note  32 year old male found down with seizure activity in setting of medication nonadherence.  Intubated on continuous EEG.  ST elevations noted on bedside monitor.  eICU Interventions  No obvious ST changes on ECG.  Repolarization pattern could be confused with ST changes on monitor..     Intervention Category Minor Interventions: Routine modifications to care plan (e.g. PRN medications for pain, fever)  Ashawna Hanback 06/14/2022, 12:05 AM

## 2022-06-14 NOTE — Procedures (Signed)
Patient Name: Miguel Black  MRN: [id number]  Epilepsy Attending: Charlsie Quest  Referring Physician/Provider: Marjorie Smolder, NP  Duration: 06/13/2022 2027 to 06/14/2022 2027  Patient history: 32 y.o. male with history of seizures, status epilepticus, substance abuse and relapsing remitting MS who was admitted after having a seizure at home at about 11:00 AM.  EEG to evaluate for seizure.  Level of alertness: comatose  AEDs during EEG study: LEV, LCM, Propofol  Technical aspects: This EEG study was done with scalp electrodes positioned according to the 10-20 International system of electrode placement. Electrical activity was reviewed with band pass filter of 1-70Hz , sensitivity of 7 uV/mm, display speed of 43mm/sec with a 60Hz  notched filter applied as appropriate. EEG data were recorded continuously and digitally stored.  Video monitoring was available and reviewed as appropriate.  Description: EEG showed continuous generalized 3 to 6 Hz theta-delta slowing admixed with an excessive amount of  12-15 Hz beta activity distributed symmetrically and diffusely. As sedation was weaned, EEG continued to show generalized 3 to 6 Hz theta-delta slowing admixed with overriding 12-15 Hz beta activity. Additionally rhythmic 3-5hz  theta-delta slowing was also noted in right frontotemporal region.   Event button was pressed on 06/14/2022 at 0726 for shivering. Concomitant EEG before, during and after the event did not show any EEG changes suggest seizure.  Hyperventilation and photic stimulation were not performed.     ABNORMALITY - Continuous slow, generalized - Intermittent slow, right frontotemporal region.  - Excessive beta, generalized  IMPRESSION: This study is suggestive of severe diffuse encephalopathy likely related to sedation. As sedation was weaned, EEG was suggestive of cortical dysfunction in right frontotemporal region.  No seizures or epileptiform discharges were seen  throughout the recording.  Event button was pressed on 06/14/2022 at 0726 for shivering without concomitant EEG change. This was not an epileptic event.   Tonya Wantz [name]

## 2022-06-14 NOTE — Progress Notes (Signed)
vLTM maintenance  All impedances below 10kohms.  No skin breakdown noted at all skin sites 

## 2022-06-14 NOTE — TOC Benefit Eligibility Note (Signed)
Patient Product/process development scientist completed.    The patient is currently admitted and upon discharge could be taking Briviact 100 mg tablets.  Requires Prior Authorization   The patient is insured through [employer] Part D   This test claim was processed through [facility]- copay amounts may vary at other pharmacies due to pharmacy/plan contracts, or as the patient moves through the different stages of their insurance plan.  Roland Earl, CPHT Pharmacy Patient Advocate Specialist [facility] Patient Advocate Team Direct Number: [phone]  Fax: [phone]

## 2022-06-14 NOTE — Telephone Encounter (Signed)
Pharmacy Patient Advocate Encounter  Insurance verification completed.    The patient is insured through [employer] Part D   The patient is currently admitted and ran test claims for the following: Briviact.  Copays and coinsurance results were relayed to Inpatient clinical team.   

## 2022-06-15 ENCOUNTER — Other Ambulatory Visit (HOSPITAL_COMMUNITY): Payer: Self-pay

## 2022-06-15 ENCOUNTER — Telehealth (HOSPITAL_COMMUNITY): Payer: Self-pay | Admitting: Pharmacy Technician

## 2022-06-15 DIAGNOSIS — R569 Unspecified convulsions: Secondary | ICD-10-CM | POA: Diagnosis not present

## 2022-06-15 DIAGNOSIS — G40901 Epilepsy, unspecified, not intractable, with status epilepticus: Secondary | ICD-10-CM | POA: Diagnosis not present

## 2022-06-15 DIAGNOSIS — G35 Multiple sclerosis: Secondary | ICD-10-CM

## 2022-06-15 LAB — BASIC METABOLIC PANEL
Anion gap: 11 (ref 5–15)
BUN: 9 mg/dL (ref 6–20)
CO2: 24 mmol/L (ref 22–32)
Calcium: 8.4 mg/dL — ABNORMAL LOW (ref 8.9–10.3)
Chloride: 106 mmol/L (ref 98–111)
Creatinine, Ser: 0.99 mg/dL (ref 0.61–1.24)
GFR, Estimated: >60 mL/min (ref >60)
Glucose, Bld: 100 mg/dL — ABNORMAL HIGH (ref 70–99)
Potassium: 3.1 mmol/L — ABNORMAL LOW (ref 3.5–5.1)
Sodium: 141 mmol/L (ref 135–145)

## 2022-06-15 LAB — CBC
HCT: 38.1 % — ABNORMAL LOW (ref 39.0–52.0)
Hemoglobin: 12.7 g/dL — ABNORMAL LOW (ref 13.0–17.0)
MCH: 31.8 pg (ref 26.0–34.0)
MCHC: 33.3 g/dL (ref 30.0–36.0)
MCV: 95.3 fL (ref 80.0–100.0)
Platelets: 212 10*3/uL (ref 150–400)
RBC: 4 MIL/uL — ABNORMAL LOW (ref 4.22–5.81)
RDW: 13.6 % (ref 11.5–15.5)
WBC: 8.5 10*3/uL (ref 4.0–10.5)
nRBC: 0 % (ref 0.0–0.2)

## 2022-06-15 LAB — GLUCOSE, CAPILLARY: Glucose-Capillary: 105 mg/dL — ABNORMAL HIGH (ref 70–99)

## 2022-06-15 MED ORDER — POLYETHYLENE GLYCOL 3350 17 G PO PACK: 17.0000 g | PACK | Freq: Every day | ORAL | Status: DC | PRN

## 2022-06-15 MED ORDER — DOCUSATE SODIUM 100 MG PO CAPS
100.0000 mg | ORAL_CAPSULE | Freq: Two times a day (BID) | ORAL | Status: DC
  Administered 2022-06-15 – 2022-06-16 (×3): 100 mg via ORAL
  Filled 2022-06-15 (×3): qty 1

## 2022-06-15 MED ORDER — POTASSIUM CHLORIDE CRYS ER 20 MEQ PO TBCR
20.0000 meq | EXTENDED_RELEASE_TABLET | ORAL | Status: AC
  Administered 2022-06-15 (×2): 20 meq via ORAL
  Filled 2022-06-15 (×2): qty 1

## 2022-06-15 MED ORDER — ZONISAMIDE 100 MG PO CAPS
200.0000 mg | ORAL_CAPSULE | Freq: Every day | ORAL | Status: DC
  Administered 2022-06-15 – 2022-06-16 (×2): 200 mg via ORAL
  Filled 2022-06-15 (×2): qty 2

## 2022-06-15 MED ORDER — POTASSIUM CHLORIDE 10 MEQ/100ML IV SOLN
10.0000 meq | INTRAVENOUS | Status: AC
  Administered 2022-06-15 (×4): 10 meq via INTRAVENOUS
  Filled 2022-06-15: qty 100

## 2022-06-15 NOTE — Evaluation (Addendum)
Physical Therapy Evaluation Patient Details Name: Miguel Black MRN: [id number] DOB: Jun 03, 1990 Today's Date: 06/15/2022  History of Present Illness  32 year old male admitted 5/20 with breakthrough seizures in the setting of medication noncompliance. VDRF 5/20-5/21.  PMH: relapsing imaging MS and epilepsy  Clinical Impression  Pt admitted with above diagnosis. Pt with significant deficits post seizure with left hemibody weaker than PTA. Pt was walking Modif I PTA and now needing mod assist of 2. Pt normally bathes and dresses himself and took incr time to get socks on today. Talked with pts' mom as she and family provide 24 hour care for pt and she will meet this PT at 11 am tomorrow to determine if they can provide what pt needs.  Pt may need post acute therapy and should tolerate > 3 hours day.  Pt currently with functional limitations due to the deficits listed below (see PT Problem List). Pt will benefit from acute skilled PT to increase their independence and safety with mobility to allow discharge.          Recommendations for follow up therapy are one component of a multi-disciplinary discharge planning process, led by the attending physician.  Recommendations may be updated based on patient status, additional functional criteria and insurance authorization.  Follow Up Recommendations       Assistance Recommended at Discharge Frequent or constant Supervision/Assistance  Patient can return home with the following  A lot of help with walking and/or transfers;A lot of help with bathing/dressing/bathroom;Assistance with cooking/housework;Assist for transportation;Help with stairs or ramp for entrance    Equipment Recommendations Other (comment) (TBA)  Recommendations for Other Services  Rehab consult    Functional Status Assessment Patient has had a recent decline in their functional status and demonstrates the ability to make significant improvements in function in a reasonable and  predictable amount of time.     Precautions / Restrictions Precautions Precautions: Fall Restrictions Weight Bearing Restrictions: No      Mobility  Bed Mobility Overal bed mobility: Needs Assistance Bed Mobility: Rolling, Sidelying to Sit Rolling: Mod assist, +2 for physical assistance Sidelying to sit: Mod assist, +2 for physical assistance       General bed mobility comments: Pt needed assist due to impulsivity and pt with difficulty rolling and coming to eOB with significant decr movement onleft LE on his own as well as poor coordination of movement.    Transfers Overall transfer level: Needs assistance Equipment used: 2 person hand held assist Transfers: Sit to/from Stand, Bed to chair/wheelchair/BSC Sit to Stand: Mod assist, +2 physical assistance, From elevated surface   Step pivot transfers: Mod assist, +2 physical assistance, From elevated surface       General transfer comment: Pt had difficulty standing fully upright wtih incr assist by PT. Pt able to take steps to chair however incr difficulty moving left LE with great effort to coordinate movement and incr hip flexion with poor knee stability.  Pt needed bil HHA as well for safety.    Ambulation/Gait                  Stairs            Wheelchair Mobility    Modified Rankin (Stroke Patients Only)       Balance Overall balance assessment: Needs assistance Sitting-balance support: No upper extremity supported, Feet supported, Bilateral upper extremity supported Sitting balance-Leahy Scale: Poor Sitting balance - Comments: needed steadying assist in sitting at times.   Standing balance support:  Bilateral upper extremity supported, During functional activity Standing balance-Leahy Scale: Poor Standing balance comment: relies on UE support bilaterally to pivot to chair                             Pertinent Vitals/Pain Pain Assessment Pain Assessment: No/denies pain    Home  Living Family/patient expects to be discharged to:: Private residence Living Arrangements: Parent;Other relatives (Pt lives with parents and older brother.) Available Help at Discharge: Available 24 hours/day;Family Type of Home: Apartment Home Access: Stairs to enter Entrance Stairs-Rails: Right;Left;Can reach both Entrance Stairs-Number of Steps: 7 Alternate Level Stairs-Number of Steps: flight Home Layout: Two level Home Equipment: Shower seat;Cane - Programmer, applications (2 wheels);Grab bars - tub/shower;Wheelchair - manual Additional Comments: Mother works from home.    Prior Function Prior Level of Function : Independent/Modified Independent             Mobility Comments: Pt reports using quad cane for ambulation outdoors and indoors and uses wheelchair sometimes as well ADLs Comments: Independent     Hand Dominance   Dominant Hand: Right (Pt reports he uses Right hand for eating and writing and Left hand for all other tasks.)    Extremity/Trunk Assessment   Upper Extremity Assessment Upper Extremity Assessment: LUE deficits/detail LUE Deficits / Details: grossly 2+/5 LUE Coordination: decreased fine motor;decreased gross motor    Lower Extremity Assessment Lower Extremity Assessment: LLE deficits/detail LLE Deficits / Details: grossly 3-/5 LLE Coordination: decreased fine motor;decreased gross motor    Cervical / Trunk Assessment Cervical / Trunk Assessment: Kyphotic  Communication   Communication: No difficulties  Cognition Arousal/Alertness: Awake/alert Behavior During Therapy: WFL for tasks assessed/performed Overall Cognitive Status: Impaired/Different from baseline Area of Impairment: Safety/judgement                         Safety/Judgement: Decreased awareness of safety, Decreased awareness of deficits              General Comments General comments (skin integrity, edema, etc.): 77 bpm, 92%, 114/76    Exercises     Assessment/Plan     PT Assessment Patient needs continued PT services  PT Problem List Decreased activity tolerance;Decreased balance;Decreased strength;Decreased range of motion;Decreased mobility;Decreased coordination;Decreased knowledge of use of DME;Decreased safety awareness;Decreased knowledge of precautions       PT Treatment Interventions DME instruction;Gait training;Functional mobility training;Stair training;Therapeutic activities;Therapeutic exercise;Balance training;Patient/family education    PT Goals (Current goals can be found in the Care Plan section)  Acute Rehab PT Goals Patient Stated Goal: to go home PT Goal Formulation: With patient Time For Goal Achievement: 06/29/22 Potential to Achieve Goals: Good    Frequency Min 3X/week     Co-evaluation               AM-PAC PT "6 Clicks" Mobility  Outcome Measure Help needed turning from your back to your side while in a flat bed without using bedrails?: A Lot Help needed moving from lying on your back to sitting on the side of a flat bed without using bedrails?: A Lot Help needed moving to and from a bed to a chair (including a wheelchair)?: Total Help needed standing up from a chair using your arms (e.g., wheelchair or bedside chair)?: Total Help needed to walk in hospital room?: Total Help needed climbing 3-5 steps with a railing? : Total 6 Click Score: 8    End of Session  Equipment Utilized During Treatment: Gait belt Activity Tolerance: Patient limited by fatigue Patient left: in chair;with call bell/phone within reach;with chair alarm set Nurse Communication: Mobility status PT Visit Diagnosis: Unsteadiness on feet (R26.81)    Time: 1610-9604 PT Time Calculation (min) (ACUTE ONLY): 20 min   Charges:   PT Evaluation $PT Eval Moderate Complexity: 1 Mod          Rebie Peale M,PT Acute Rehab Services [phone]   Bevelyn Buckles 06/15/2022, 2:24 PM

## 2022-06-15 NOTE — TOC Benefit Eligibility Note (Signed)
Patient Product/process development scientist completed.    The patient is currently admitted and upon discharge could be taking lacosamide (Vimpat) 100 mg tablets.  The current 30 day co-pay is $0.00.   The patient is insured through [employer] Part D   This test claim was processed through [facility]- copay amounts may vary at other pharmacies due to pharmacy/plan contracts, or as the patient moves through the different stages of their insurance plan.  Roland Earl, CPHT Pharmacy Patient Advocate Specialist [facility] Patient Advocate Team Direct Number: [phone]  Fax: [phone]

## 2022-06-15 NOTE — Progress Notes (Signed)
NAME:  Miguel Black, MRN:  [id number], DOB:  1990-07-27, LOS: 2 ADMISSION DATE:  06/13/2022, CONSULTATION DATE:  06/13/2022 REFERRING MD:  Dr. Dalene Seltzer, CHIEF COMPLAINT: Status epilepticus  History of Present Illness:  Miguel Black is a 32 y.o. male with a past medical history significant for seizures, prior status epilepticus, multiple sclerosis, and substance abuse who presented to the ED via EMS after being found unresponsive with seizure-like activity.  Per mother patient is noncompliant with Keppra.  She found patient on the floor at 11 AM "shaking" she administered diazepam per rectum when patient did not respond back to baseline EMS was called.  EMS administered an additional 10 mg of Versed.  Post Versed administration on ED arrival patient was able to follow some commands but later displayed recurrent seizure activity.  Patient was emergently intubated for airway protection and need for stat head CT.  PCCM consulted for further management.  Pertinent  Medical History  Seizures, prior status epilepticus, multiple sclerosis, and substance abuse   Significant Hospital Events: Including procedures, antibiotic start and stop dates in addition to other pertinent events   5/20 presented with status, intubated for airway protection on ED arrival. 5/21 extubated   Interim History / Subjective:  Remains on cEEG No clinical seizures On room air No complaints> wants to go home  Objective   Blood pressure 114/74, pulse 83, temperature 98.6 F (37 C), temperature source Oral, resp. rate 20, weight 70.3 kg, SpO2 100 %.   Intake/Output Summary (Last 24 hours) at 06/15/2022 0953 Last data filed at 06/15/2022 0800 Gross per 24 hour  Intake 560.5 ml  Output 460 ml  Net 100.5 ml   Filed Weights   06/14/22 0500 06/14/22 2141 06/15/22 0425  Weight: 70.8 kg 70.3 kg 70.3 kg    Examination: General:  Adult male sitting upright in bed in NAD HEENT: MM pink/moist, pupils 3/r, speech  slightly slurred Neuro: Alert, oriented, chronic weakness LUE/ LLE, no deficits on R CV: rr, NSR, no murmur PULM:  non labored, CTA GI: soft, bs+, NT, voids Extremities: warm/dry, no LE edema  Skin: no rashes    Resolved Hospital Problem list     Assessment & Plan:  Status epilepticus with history of seizure disorder Medication noncompliance  -Medication reconciliation pending but appears patient is on 1000g of Keppra twice daily at baseline.  History of medication noncompliance.  Pt reported he did not like behavioral SE with keppra History of MS P: - Neurology following, appreciate assistance.  Stopping cEEG - cont AED per neurology> briviact, stopping vimpat, resuming home zonisamide - has neurology f/u - possible d/c home later today if able to arrange home meds - Seizure precautions - PT   Respiratory insufficiency in the setting of above -Per report patient had recurrent seizure-like activity prior to head CT prompting emergent intubation for airway protection. P: - extubated 5/21 doing great on RA.  No complaints.  Cont pulmonary hygiene   Leukocytosis  -Likely reactive, workup currently pending but no clinical signs of infection currently. P: - resolved on CBC, remains afebrile,  likely reactive.  No infectious symptoms   Best Practice (right click and "Reselect all SmartList Selections" daily)   Diet/type: Regular consistency (see orders)>  DVT prophylaxis: LMWH GI prophylaxis: PPI Lines: N/A Foley:  N/A Code Status:  full code Last date of multidisciplinary goals of care discussion: Pending   Labs   CBC: Recent Labs  Lab 06/13/22 1427 06/13/22 1523 06/14/22 0841 06/15/22 0210  WBC 14.8*  --  7.8 8.5  NEUTROABS 13.9*  --   --   --   HGB 13.7 12.6* 12.4* 12.7*  HCT 43.3 37.0* 37.5* 38.1*  MCV 100.0  --  94.5 95.3  PLT 268  --  240 212    Basic Metabolic Panel: Recent Labs  Lab 06/13/22 1427 06/13/22 1523 06/14/22 0841 06/15/22 0210  NA 138  139 141 141  K 4.1 3.7 3.6 3.1*  CL 104  --  107 106  CO2 21*  --  24 24  GLUCOSE 98  --  97 100*  BUN 7  --  6 9  CREATININE 1.01  --  0.95 0.99  CALCIUM 8.7*  --  8.4* 8.4*  MG 1.8  --  2.0  --    GFR: Estimated Creatinine Clearance: 108.5 mL/min (by C-G formula based on SCr of 0.99 mg/dL). Recent Labs  Lab 06/13/22 1427 06/13/22 1533 06/13/22 1829 06/14/22 0841 06/15/22 0210  WBC 14.8*  --   --  7.8 8.5  LATICACIDVEN  --  2.7* 2.3*  --   --     Liver Function Tests: Recent Labs  Lab 06/13/22 1427  AST 43*  ALT 20  ALKPHOS 65  BILITOT 0.7  PROT 7.1  ALBUMIN 4.2   No results for input(s): "LIPASE", "AMYLASE" in the last 168 hours. No results for input(s): "AMMONIA" in the last 168 hours.  ABG    Component Value Date/Time   PHART 7.263 (L) 06/13/2022 1523   PCO2ART 51.3 (H) 06/13/2022 1523   PO2ART 510 (H) 06/13/2022 1523   HCO3 23.1 06/13/2022 1523   TCO2 25 06/13/2022 1523   ACIDBASEDEF 4.0 (H) 06/13/2022 1523   O2SAT 100 06/13/2022 1523     Coagulation Profile: No results for input(s): "INR", "PROTIME" in the last 168 hours.  Cardiac Enzymes: No results for input(s): "CKTOTAL", "CKMB", "CKMBINDEX", "TROPONINI" in the last 168 hours.  HbA1C: Hgb A1c MFr Bld  Date/Time Value Ref Range Status  08/22/2021 06:37 AM 5.5 4.8 - 5.6 % Final    Comment:    (NOTE) Pre diabetes:          5.7%-6.4%  Diabetes:              >6.4%  Glycemic control for   <7.0% adults with diabetes     CBG: Recent Labs  Lab 06/14/22 0341 06/14/22 0746 06/14/22 1127 06/14/22 1916 06/15/22 0721  GLUCAP 95 113* 95 122* 105*    Critical care time: n/a      Posey Boyer, MSN, AG-ACNP-BC [facility] 06/15/2022, 9:54 AM  See Amion for pager If no response to pager, please call PCCM consult pager After 7:00 pm call Elink

## 2022-06-15 NOTE — Telephone Encounter (Signed)
Pharmacy Patient Advocate Encounter  Insurance verification completed.    The patient is insured through [employer] Part D   The patient is currently admitted and ran test claims for the following:  lacosamide (Vimpat) .  Copays and coinsurance results were relayed to Inpatient clinical team.

## 2022-06-15 NOTE — Progress Notes (Signed)
EEG D/C'd. Patient had no skin break down. [facility] notified.  

## 2022-06-15 NOTE — Progress Notes (Signed)
Subjective: No acute events overnight.  No further seizures.  States he was not taking Keppra due to side effects (irritability and mood changes).  ROS: negative except above  Examination  Vital signs in last 24 hours: Temp:  [98.3 F (36.8 C)-99.2 F (37.3 C)] 98.3 F (36.8 C) (05/22 1115) Pulse Rate:  [55-109] 73 (05/22 1100) Resp:  [14-27] 16 (05/22 1100) BP: (111-149)/(68-88) 111/79 (05/22 1100) SpO2:  [96 %-100 %] 100 % (05/22 1100) FiO2 (%):  [40 %] 40 % (05/21 1357) Weight:  [70.3 kg] 70.3 kg (05/22 0425)  General: lying in bed, NAD Neuro: AOx3, cranial nerves appear grossly intact, 5/5 strength in right upper and right lower extremity, 4/5 in left upper and left lower extremity    Basic Metabolic Panel: Recent Labs  Lab 06/13/22 1427 06/13/22 1523 06/14/22 0841 06/15/22 0210  NA 138 139 141 141  K 4.1 3.7 3.6 3.1*  CL 104  --  107 106  CO2 21*  --  24 24  GLUCOSE 98  --  97 100*  BUN 7  --  6 9  CREATININE 1.01  --  0.95 0.99  CALCIUM 8.7*  --  8.4* 8.4*  MG 1.8  --  2.0  --     CBC: Recent Labs  Lab 06/13/22 1427 06/13/22 1523 06/14/22 0841 06/15/22 0210  WBC 14.8*  --  7.8 8.5  NEUTROABS 13.9*  --   --   --   HGB 13.7 12.6* 12.4* 12.7*  HCT 43.3 37.0* 37.5* 38.1*  MCV 100.0  --  94.5 95.3  PLT 268  --  240 212     Coagulation Studies: No results for input(s): "LABPROT", "INR" in the last 72 hours.  Imaging No new brain imaging overnight   ASSESSMENT AND PLAN: 32 year old male with relapsing imaging MS and epilepsy presented with breakthrough seizures in the setting of medication noncompliance.   Epilepsy with breakthrough seizure Multiple sclerosis -Breakthrough seizure in the setting of medication noncompliance   Recommendations - Continue Briviact 100 mg twice daily due to significant irritability on keppra -Switch back to zonisamide 200 mg daily (home dose) -DC vimpat -Mother reports patient has intranasal benzo for rescue -DC LTM  EEG as no further seizures -Seizure precautions including do not drive -As needed IV Versed for clinical seizures -Follow-up with Dr. Sherol Dade at [facility] on 06/29/2022 at 2 PM  Seizure precautions: Per [facility] statutes, patients with seizures are not allowed to drive until they have been seizure-free for six months and cleared by a physician    Use caution when using heavy equipment or power tools. Avoid working on ladders or at heights. Take showers instead of baths. Ensure the water temperature is not too high on the home water heater. Do not go swimming alone. Do not lock yourself in a room alone (i.e. bathroom). When caring for infants or small children, sit down when holding, feeding, or changing them to minimize risk of injury to the child in the event you have a seizure. Maintain good sleep hygiene. Avoid alcohol.    If patient has another seizure, call 911 and bring them back to the ED if: A.  The seizure lasts longer than 5 minutes.      B.  The patient doesn't wake shortly after the seizure or has new problems such as difficulty seeing, speaking or moving following the seizure C.  The patient was injured during the seizure D.  The patient has a temperature  over 102 F (39C) E.  The patient vomited during the seizure and now is having trouble breathing    During the Seizure   - First, ensure adequate ventilation and place patients on the floor on their left side  Loosen clothing around the neck and ensure the airway is patent. If the patient is clenching the teeth, do not force the mouth open with any object as this can cause severe damage - Remove all items from the surrounding that can be hazardous. The patient may be oblivious to what's happening and may not even know what he or she is doing. If the patient is confused and wandering, either gently guide him/her away and block access to outside areas - Reassure the individual and be comforting - Call  911. In most cases, the seizure ends before EMS arrives. However, there are cases when seizures may last over 3 to 5 minutes. Or the individual may have developed breathing difficulties or severe injuries. If a pregnant patient or a person with diabetes develops a seizure, it is prudent to call an ambulance. - Finally, if the patient does not regain full consciousness, then call EMS. Most patients will remain confused for about 45 to 90 minutes after a seizure, so you must use judgment in calling for help.    After the Seizure (Postictal Stage)   After a seizure, most patients experience confusion, fatigue, muscle pain and/or a headache. Thus, one should permit the individual to sleep. For the next few days, reassurance is essential. Being calm and helping reorient the person is also of importance.   Most seizures are painless and end spontaneously. Seizures are not harmful to others but can lead to complications such as stress on the lungs, brain and the heart. Individuals with prior lung problems may develop labored breathing and respiratory distress.        I have spent a total of 36   minutes with the patient reviewing hospital notes,  test results, labs and examining the patient as well as establishing an assessment and plan that was discussed personally with the patient.  > 50% of time was spent in direct patient care.     Lindie Spruce Epilepsy [facility] For questions after 5pm please refer to AMION to reach the Neurologist on call

## 2022-06-15 NOTE — Progress Notes (Signed)
Patient transferred to bed, vitals taken, room and equipment orientation given. Call light in reach. There are no tele boxes not bedside monitors to put on the patient. Waiting for a discharge to place tele box

## 2022-06-15 NOTE — TOC Initial Note (Signed)
Transition of Care [facility]) - Initial/Assessment Note    Patient Details  Name: Miguel Black MRN: [id number] Date of Birth: February 02, 1990  Transition of Care [facility]) CM/SW Contact:    Tom-Johnson, Hershal Coria, RN Phone Number: 06/15/2022, 3:21 PM  Clinical Narrative:                  CM spoke with patient at bedside and mother via phone per patient's request. Patient is admitted for Status Epilepticus. Patient states he was not compliant with taking his Keppra, make him irritable. Benefit check done for Vimpat, patient to start at d/c. No copay noted. Neurology following.  Patient is from home with both parents and brother. On disability. Mother transports to and from appointments.  Has a cane, shower seat and grab bars at home.  PCP is Lula Olszewski, MD and [facility] [address].  PT recommended CIR, patient's insurance not within their network. Patient declined CIR, wants to go home and continue outpatient PT at [facility]. Order and referral on AVS.  CM will continue to follow as patient progresses with care towards discharge.       Expected Discharge Plan: IP Rehab Facility Barriers to Discharge: Continued Medical Work up   Patient Goals and CMS Choice Patient states their goals for this hospitalization and ongoing recovery are:: To go to rehab and return home CMS Medicare.gov Compare Post Acute Care list provided to:: Patient Choice offered to / list presented to : Patient      Expected Discharge Plan and Services   Discharge Planning Services: CM Consult Post Acute Care Choice: IP Rehab Living arrangements for the past 2 months: Apartment (Condominium)                           HH Arranged: NA HH Agency: NA        Prior Living Arrangements/Services Living arrangements for the past 2 months: Apartment (Condominium) Lives with:: Parents, Siblings (Mother, father and brother) Patient language and need for interpreter reviewed:: Yes Do  you feel safe going back to the place where you live?: Yes      Need for Family Participation in Patient Care: Yes (Comment) Care giver support system in place?: Yes (comment) Current home services: DME (Cane, shower seat, grab bars.) Criminal Activity/Legal Involvement Pertinent to Current Situation/Hospitalization: No - Comment as needed  Activities of Daily Living      Permission Sought/Granted Permission sought to share information with : Case Manager, Family Supports, Oceanographer granted to share information with : Yes, Verbal Permission Granted              Emotional Assessment Appearance:: Appears stated age Attitude/Demeanor/Rapport: Engaged, Gracious Affect (typically observed): Accepting, Appropriate, Calm, Hopeful, Pleasant Orientation: : Oriented to Self, Oriented to Place, Oriented to  Time, Oriented to Situation Alcohol / Substance Use: Not Applicable Psych Involvement: No (comment)  Admission diagnosis:  Status epilepticus (HCC) [G40.901] Patient Active Problem List   Diagnosis Date Noted   Status epilepticus (HCC) 06/13/2022   Ambulates with cane 06/04/2022   Ataxia 06/04/2022   Foot drop 06/03/2022   Vitamin D deficiency 06/03/2022   Smokes cigars 06/03/2022   Neurological deficit present 04/26/2022   History of optic neuritis 07/29/2020   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 [place] [address] Phone: [phone] Fax: [phone]  [facility] [place] - [address] [place] [address] Phone: [phone] Fax: [phone]  [facility] [address] [place] [address] Phone: [phone] Fax: [phone]     Social Determinants of Health (SDOH) Social History: SDOH Screenings   Depression (PHQ2-9): Low Risk  (06/03/2022)  Tobacco Use: High Risk (06/13/2022)   SDOH Interventions: Transportation Interventions: Intervention Not Indicated, Inpatient TOC, Patient Resources (Friends/Family)   Readmission Risk Interventions    06/15/2022    2:41 PM  Readmission Risk Prevention Plan  Transportation Screening Complete  PCP or Specialist Appt within 5-7 Days Complete  Home Care Screening Complete  Medication Review (RN CM) Referral to Pharmacy

## 2022-06-15 NOTE — Progress Notes (Signed)
[facility] ADULT ICU REPLACEMENT PROTOCOL   The patient does apply for the [facility] Adult ICU Electrolyte Replacment Protocol based on the criteria listed below:   1.Exclusion criteria: TCTS, ECMO, Dialysis, and Myasthenia Gravis patients 2. Is GFR >/= 30 ml/min? Yes.    Patient's GFR today is >60 3. Is SCr </= 2? Yes.   Patient's SCr is 0.99 mg/dL 4. Did SCr increase >/= 0.5 in 24 hours? No. 5.Pt's weight >40kg  Yes.   6. Abnormal electrolyte(s):   K 3.1  7. Electrolytes replaced per protocol 8.  Call MD STAT for K+ </= 2.5, Phos </= 1, or Mag </= 1 Physician:  Shawn Stall R Deborah Dondero 06/15/2022 3:51 AM

## 2022-06-15 NOTE — Procedures (Addendum)
Patient Name: Miguel Black  MRN: [id number]  Epilepsy Attending: Charlsie Quest  Referring Physician/Provider: Marjorie Smolder, NP  Duration: 06/14/2022 2027 to 06/15/2022 1119   Patient history: 32 y.o. male with history of seizures, status epilepticus, substance abuse and relapsing remitting MS who was admitted after having a seizure at home at about 11:00 AM.  EEG to evaluate for seizure.   Level of alertness: lethargic   AEDs during EEG study: LEV, LCM  Technical aspects: This EEG study was done with scalp electrodes positioned according to the 10-20 International system of electrode placement. Electrical activity was reviewed with band pass filter of 1-70Hz , sensitivity of 7 uV/mm, display speed of 10mm/sec with a 60Hz  notched filter applied as appropriate. EEG data were recorded continuously and digitally stored.  Video monitoring was available and reviewed as appropriate.   Description: EEG showed continuous generalized 3 to 6 Hz theta-delta slowing admixed with overriding 12-15 Hz beta activity. Additionally rhythmic 3-5hz  theta-delta slowing was also noted in right frontotemporal region. Hyperventilation and photic stimulation were not performed.      ABNORMALITY - Continuous slow, generalized - Intermittent slow, right frontotemporal region.  - Excessive beta, generalized   IMPRESSION: This study was suggestive of cortical dysfunction in right frontotemporal region. Additionally there was moderate diffuse encephalopathy. No seizures or epileptiform discharges were seen throughout the recording.   Shalayah Beagley [name]

## 2022-06-15 NOTE — Progress Notes (Signed)
LTM maint complete - no skin breakdown under: Fp1 Fp2 A1 A2 AllLeads attached, impedance below 10kOhms [facility] monitored, Event button test confirmed by [facility].

## 2022-06-15 NOTE — Progress Notes (Signed)
Inpatient Rehab Admissions Coordinator:   Per therapy recommendations,  patient was screened for CIR candidacy by Megan Salon, MS, CCC-SLP. Pt. Is potentially a good AIR candidate; however, Pt.'s insurance is not in network with CIR. If AIR is desired, TOC will need to fax out to other AIRs in network with his insurance.    Megan Salon, MS, CCC-SLP Rehab Admissions Coordinator  [phone] (celll) [phone] (office)

## 2022-06-15 NOTE — Progress Notes (Signed)
Not ready for discharge home due to PT findings and pending further recommendations.  Will transfer to floor and to [facility] for pick up 5/23.

## 2022-06-16 ENCOUNTER — Ambulatory Visit: Payer: Medicare (Managed Care)

## 2022-06-16 ENCOUNTER — Other Ambulatory Visit (HOSPITAL_COMMUNITY): Payer: Self-pay

## 2022-06-16 DIAGNOSIS — G40901 Epilepsy, unspecified, not intractable, with status epilepticus: Secondary | ICD-10-CM | POA: Diagnosis not present

## 2022-06-16 LAB — BASIC METABOLIC PANEL
Anion gap: 11 (ref 5–15)
BUN: 9 mg/dL (ref 6–20)
CO2: 22 mmol/L (ref 22–32)
Calcium: 8.7 mg/dL — ABNORMAL LOW (ref 8.9–10.3)
Chloride: 109 mmol/L (ref 98–111)
Creatinine, Ser: 1.06 mg/dL (ref 0.61–1.24)
GFR, Estimated: >60 mL/min (ref >60)
Glucose, Bld: 100 mg/dL — ABNORMAL HIGH (ref 70–99)
Potassium: 3.9 mmol/L (ref 3.5–5.1)
Sodium: 142 mmol/L (ref 135–145)

## 2022-06-16 LAB — MAGNESIUM: Magnesium: 2 mg/dL (ref 1.7–2.4)

## 2022-06-16 MED ORDER — BRIVARACETAM 100 MG PO TABS
100.0000 mg | ORAL_TABLET | Freq: Two times a day (BID) | ORAL | 2 refills | Status: DC
  Filled 2022-06-16: qty 60, 30d supply, fill #0

## 2022-06-16 NOTE — Progress Notes (Signed)
IV removed and telemetry discontinued. Mother at bedside. Patient to be wheeled off of unit for transport home.  Melony Overly, RN

## 2022-06-16 NOTE — Evaluation (Signed)
Occupational Therapy Evaluation Patient Details Name: Miguel Black MRN: [id number] DOB: 18-Jan-1991 Today's Date: 06/16/2022   History of Present Illness 32 year old male admitted 5/20 with breakthrough seizures in the setting of medication noncompliance. VDRF 5/20-5/21.  PMH: relapsing imaging MS and epilepsy   Clinical Impression   PTA, pt lives with family, typically Modified Independent with ADLs and mobility using cane (primarily outside of the home). Pt presents now with deficits in coordination, L sided strength, safety awareness, standing balance and endurance. Pt requires overall Min A for LB ADLs w/ emphasis on compensatory education and consideration for AE education in future. Pt requires Min A for transfers/short mobility using RW, increased assist/unsteadiness noted with use of baseline cane. Pt's mother present and able to provide assist at DC. Would recommend OP OT as pt now having new issues managing ADLs.      Recommendations for follow up therapy are one component of a multi-disciplinary discharge planning process, led by the attending physician.  Recommendations may be updated based on patient status, additional functional criteria and insurance authorization.   Assistance Recommended at Discharge Frequent or constant Supervision/Assistance  Patient can return home with the following A little help with walking and/or transfers;A little help with bathing/dressing/bathroom;Assistance with cooking/housework;Direct supervision/assist for medications management;Direct supervision/assist for financial management;Assist for transportation;Help with stairs or ramp for entrance    Functional Status Assessment  Patient has had a recent decline in their functional status and demonstrates the ability to make significant improvements in function in a reasonable and predictable amount of time.  Equipment Recommendations  Other (comment) (consider AE for LB ADL)    Recommendations for  Other Services       Precautions / Restrictions Precautions Precautions: Fall;Other (comment) Precaution Comments: L AFO at baseline Restrictions Weight Bearing Restrictions: No      Mobility Bed Mobility Overal bed mobility: Needs Assistance Bed Mobility: Supine to Sit     Supine to sit: Min guard, HOB elevated     General bed mobility comments: increased time/effort but able to swing LE off of bed    Transfers Overall transfer level: Needs assistance Equipment used: 1 person hand held assist, 2 person hand held assist, Quad cane Transfers: Sit to/from Stand Sit to Stand: Min assist           General transfer comment: initial stands min guard to Min A without AD (during LB dressing), Min A for quad cane standing w/ cues for DME use - switched to RW for mobility trial      Balance Overall balance assessment: Needs assistance Sitting-balance support: No upper extremity supported, Feet supported, Bilateral upper extremity supported Sitting balance-Leahy Scale: Fair Sitting balance - Comments: deficits noted with bending forward/dynamic tasks with min guard for safety   Standing balance support: Single extremity supported, Bilateral upper extremity supported, During functional activity Standing balance-Leahy Scale: Poor                             ADL either performed or assessed with clinical judgement   ADL Overall ADL's : Needs assistance/impaired Eating/Feeding: Set up   Grooming: Minimal assistance;Standing Grooming Details (indicate cue type and reason): able to wash face, turn on faucet with L hand though anticipate some assist with bimanual tasks due to impaired balance Upper Body Bathing: Set up;Sitting   Lower Body Bathing: Minimal assistance;Sit to/from stand   Upper Body Dressing : Set up;Sitting   Lower Body Dressing: Sit to/from  stand;Sitting/lateral leans;Minimal assistance Lower Body Dressing Details (indicate cue type and reason):  increased time/effort with some light assist for sock/shoe mgmt for LLE d/t AFO. educated on shoehorn likely helpful with this task  - mother present and aware of this AE. Discussed easier clothing to wear, donning LLE first, bunching pants leg up to get over foot easier. Min A for underwear/pants mgmt Toilet Transfer: Minimal assistance;Ambulation;Rolling walker (2 wheels)   Toileting- Clothing Manipulation and Hygiene: Minimal assistance;Sitting/lateral lean;Sit to/from stand       Functional mobility during ADLs: Minimal assistance;Rolling walker (2 wheels);Cueing for sequencing;Cueing for safety General ADL Comments: Pt w/ coordination and balance deficits as well as slower processing wtih decreased insight into these deficits. encouraged pt/mother for ADL task modification, sink baths rather than shower initially if not feeling steady/strong     Vision Baseline Vision/History: 1 Wears glasses Ability to See in Adequate Light: 0 Adequate Patient Visual Report: No change from baseline Vision Assessment?: No apparent visual deficits     Perception     Praxis      Pertinent Vitals/Pain Pain Assessment Pain Assessment: No/denies pain     Hand Dominance Right   Extremity/Trunk Assessment Upper Extremity Assessment Upper Extremity Assessment: LUE deficits/detail LUE Deficits / Details: impaired coordination though functional. grossly weak LUE Coordination: decreased fine motor;decreased gross motor   Lower Extremity Assessment Lower Extremity Assessment: Defer to PT evaluation   Cervical / Trunk Assessment Cervical / Trunk Assessment: Normal   Communication Communication Communication: No difficulties   Cognition Arousal/Alertness: Awake/alert Behavior During Therapy: WFL for tasks assessed/performed, Impulsive Overall Cognitive Status: Impaired/Different from baseline Area of Impairment: Safety/judgement                         Safety/Judgement: Decreased  awareness of safety, Decreased awareness of deficits     General Comments: impulsive with movements at times, decreased awareness of balance/coordination deficits, sitting too close to EOB, etc     General Comments  Mother present at bedside    Exercises     Shoulder Instructions      Home Living Family/patient expects to be discharged to:: Private residence Living Arrangements: Parent;Other relatives (brother) Available Help at Discharge: Available 24 hours/day;Family Type of Home: Apartment Home Access: Stairs to enter Entrance Stairs-Number of Steps: 7 Entrance Stairs-Rails: Right;Left;Can reach both Home Layout: Two level Alternate Level Stairs-Number of Steps: flight Alternate Level Stairs-Rails: Left Bathroom Shower/Tub: Chief Strategy Officer: Standard     Home Equipment: Shower seat;Cane - Programmer, applications (2 wheels);Grab bars - tub/shower;Wheelchair - manual   Additional Comments: Mother works from home.      Prior Functioning/Environment Prior Level of Function : Independent/Modified Independent             Mobility Comments: Pt reports using quad cane for ambulation outdoors ADLs Comments: Reports modified independent for ADLs typically, family normally does iADLs        OT Problem List: Decreased strength;Decreased activity tolerance;Impaired balance (sitting and/or standing);Decreased coordination;Decreased cognition;Decreased safety awareness;Decreased knowledge of use of DME or AE      OT Treatment/Interventions: Self-care/ADL training;Therapeutic exercise;Energy conservation;DME and/or AE instruction;Therapeutic activities;Balance training;Patient/family education    OT Goals(Current goals can be found in the care plan section) Acute Rehab OT Goals Patient Stated Goal: home today OT Goal Formulation: With patient/family Time For Goal Achievement: 06/30/22 Potential to Achieve Goals: Good  OT Frequency: Min 2X/week     Co-evaluation  AM-PAC OT "6 Clicks" Daily Activity     Outcome Measure Help from another person eating meals?: A Little Help from another person taking care of personal grooming?: A Little Help from another person toileting, which includes using toliet, bedpan, or urinal?: A Little Help from another person bathing (including washing, rinsing, drying)?: A Little Help from another person to put on and taking off regular upper body clothing?: A Little Help from another person to put on and taking off regular lower body clothing?: A Little 6 Click Score: 18   End of Session Equipment Utilized During Treatment: Gait belt;Rolling walker (2 wheels)  Activity Tolerance: Patient tolerated treatment well Patient left: Other (comment);with family/visitor present (walking with PT)  OT Visit Diagnosis: Unsteadiness on feet (R26.81);Other abnormalities of gait and mobility (R26.89);Muscle weakness (generalized) (M62.81);Ataxia, unspecified (R27.0);Other symptoms and signs involving cognitive function                Time: 1610-9604 OT Time Calculation (min): 21 min Charges:  OT General Charges $OT Visit: 1 Visit OT Evaluation $OT Eval Moderate Complexity: 1 Mod  Bradd Canary, OTR/L Acute Rehab Services Office: [phone]   Lorre Munroe 06/16/2022, 11:52 AM

## 2022-06-16 NOTE — Discharge Instructions (Signed)
Seizure precautions: Per [place] DMV statutes, patients with seizures are not allowed to drive until they have been seizure-free for six months and cleared by a physician    Use caution when using heavy equipment or power tools. Avoid working on ladders or at heights. Take showers instead of baths. Ensure the water temperature is not too high on the home water heater. Do not go swimming alone. Do not lock yourself in a room alone (i.e. bathroom). When caring for infants or small children, sit down when holding, feeding, or changing them to minimize risk of injury to the child in the event you have a seizure. Maintain good sleep hygiene. Avoid alcohol.    If patient has another seizure, call 911 and bring them back to the ED if: A.  The seizure lasts longer than 5 minutes.      B.  The patient doesn't wake shortly after the seizure or has new problems such as difficulty seeing, speaking or moving following the seizure C.  The patient was injured during the seizure D.  The patient has a temperature over 102 F (39C) E.  The patient vomited during the seizure and now is having trouble breathing    During the Seizure   - First, ensure adequate ventilation and place patients on the floor on their left side  Loosen clothing around the neck and ensure the airway is patent. If the patient is clenching the teeth, do not force the mouth open with any object as this can cause severe damage - Remove all items from the surrounding that can be hazardous. The patient may be oblivious to what's happening and may not even know what he or she is doing. If the patient is confused and wandering, either gently guide him/her away and block access to outside areas - Reassure the individual and be comforting - Call 911. In most cases, the seizure ends before EMS arrives. However, there are cases when seizures may last over 3 to 5 minutes. Or the individual may have developed breathing difficulties or severe  injuries. If a pregnant patient or a person with diabetes develops a seizure, it is prudent to call an ambulance. - Finally, if the patient does not regain full consciousness, then call EMS. Most patients will remain confused for about 45 to 90 minutes after a seizure, so you must use judgment in calling for help.      After the Seizure (Postictal Stage)   After a seizure, most patients experience confusion, fatigue, muscle pain and/or a headache. Thus, one should permit the individual to sleep. For the next few days, reassurance is essential. Being calm and helping reorient the person is also of importance.   Most seizures are painless and end spontaneously. Seizures are not harmful to others but can lead to complications such as stress on the lungs, brain and the heart. Individuals with prior lung problems may develop labored breathing and respiratory distress.  

## 2022-06-16 NOTE — Progress Notes (Signed)
Physical Therapy Treatment Patient Details Name: Miguel Black MRN: [id number] DOB: 1991/01/06 Today's Date: 06/16/2022   History of Present Illness 32 year old male admitted 5/20 with breakthrough seizures in the setting of medication noncompliance. VDRF 5/20-5/21.  PMH: relapsing imaging MS and epilepsy    PT Comments    Pt admitted with above diagnosis. Pt was able to ambulate with RW with min assist and mod cuesfor safety.  Mom present and education completed with pt and mom and they feel confident going home.  Mom to provide 24 hour care for pt and they have needed equipment. Outpt PT recommended.  Pt currently with functional limitations due to balance and endurance deficits. Pt will benefit from acute skilled PT to increase their independence and safety with mobility to allow discharge.      Recommendations for follow up therapy are one component of a multi-disciplinary discharge planning process, led by the attending physician.  Recommendations may be updated based on patient status, additional functional criteria and insurance authorization.  Follow Up Recommendations       Assistance Recommended at Discharge Intermittent Supervision/Assistance  Patient can return home with the following Assistance with cooking/housework;Assist for transportation;Help with stairs or ramp for entrance;A little help with walking and/or transfers;A little help with bathing/dressing/bathroom   Equipment Recommendations  Other (comment) (issued gait belt)    Recommendations for Other Services       Precautions / Restrictions Precautions Precautions: Fall;Other (comment) Precaution Comments: L AFO at baseline Restrictions Weight Bearing Restrictions: No     Mobility  Bed Mobility Overal bed mobility: Needs Assistance Bed Mobility: Supine to Sit     Supine to sit: Min guard, HOB elevated     General bed mobility comments: increased time/effort but able to swing LE off of bed     Transfers Overall transfer level: Needs assistance Equipment used: 1 person hand held assist, 2 person hand held assist, Quad cane Transfers: Sit to/from Stand Sit to Stand: Min assist           General transfer comment: initial stands min guard to Min A without AD (during LB dressing), Min A for quad cane standing w/ cues for DME use - switched to RW for mobility trial    Ambulation/Gait Ambulation/Gait assistance: Mod assist Gait Distance (Feet): 45 Feet Assistive device: Quad cane, Rolling walker (2 wheels) Gait Pattern/deviations: Step-to pattern, Decreased step length - left, Decreased stance time - left, Decreased weight shift to left, Decreased dorsiflexion - left, Knee hyperextension - left, Drifts right/left, Wide base of support   Gait velocity interpretation: <1.31 ft/sec, indicative of household ambulator   General Gait Details: Pt and mom state that pt had progressed to using no device in the home.  Pt intiially using SBQC with ambulation however right UE with decr coordination and pt unsteady without min assist.  Obtained RW and pt is more steady with RW however still needing min assist and moderate cues for safety as he needs assist for sequencing steps and controlling RW.  Even with pts AFO left LE, still has difficulty lifting left LE off floor at times dragging foot and needing incr time and cues for safety.  Also noted left knee hyperextension which brace appears to try and prevent.  Pt tends to push the rW too far in front of him.  Mom practiced assisting pt with gait with gait belt in place and was able to return demonstration. Had conversation with pt and mom that pt is at risk of falls  currently and will need assist with ambulation and pt and mom agree.  Also pt and mom aware that RW is recommended and to pick up throw rugs.   Stairs             Wheelchair Mobility    Modified Rankin (Stroke Patients Only)       Balance Overall balance assessment:  Needs assistance Sitting-balance support: No upper extremity supported, Feet supported, Bilateral upper extremity supported Sitting balance-Leahy Scale: Fair Sitting balance - Comments: deficits noted with bending forward/dynamic tasks with min guard for safety.  Pt put on socks and shoes with little assist but incr time.  Pt needed cues for putting on his shirt, boxers and sweatpants as well.   Standing balance support: Single extremity supported, Bilateral upper extremity supported, During functional activity Standing balance-Leahy Scale: Poor Standing balance comment: relies on UE support bilaterally to pivot to chair                            Cognition Arousal/Alertness: Awake/alert Behavior During Therapy: WFL for tasks assessed/performed, Impulsive Overall Cognitive Status: Impaired/Different from baseline Area of Impairment: Safety/judgement                         Safety/Judgement: Decreased awareness of safety, Decreased awareness of deficits     General Comments: impulsive with movements at times, decreased awareness of balance/coordination deficits, sitting too close to EOB, etc        Exercises      General Comments General comments (skin integrity, edema, etc.): mom states they have a RW      Pertinent Vitals/Pain Pain Assessment Pain Assessment: No/denies pain    Home Living Family/patient expects to be discharged to:: Private residence Living Arrangements: Parent;Other relatives (brother) Available Help at Discharge: Available 24 hours/day;Family Type of Home: Apartment Home Access: Stairs to enter Entrance Stairs-Rails: Right;Left;Can reach both Entrance Stairs-Number of Steps: 7 Alternate Level Stairs-Number of Steps: flight Home Layout: Two level Home Equipment: Shower seat;Cane - Programmer, applications (2 wheels);Grab bars - tub/shower;Wheelchair - manual Additional Comments: Mother works from home.    Prior Function             PT Goals (current goals can now be found in the care plan section) Acute Rehab PT Goals Patient Stated Goal: to go home Progress towards PT goals: Progressing toward goals    Frequency    Min 3X/week      PT Plan Discharge plan needs to be updated;Equipment recommendations need to be updated    Co-evaluation              AM-PAC PT "6 Clicks" Mobility   Outcome Measure  Help needed turning from your back to your side while in a flat bed without using bedrails?: A Little Help needed moving from lying on your back to sitting on the side of a flat bed without using bedrails?: A Little Help needed moving to and from a bed to a chair (including a wheelchair)?: A Little Help needed standing up from a chair using your arms (e.g., wheelchair or bedside chair)?: A Little Help needed to walk in hospital room?: A Little Help needed climbing 3-5 steps with a railing? : A Lot 6 Click Score: 17    End of Session Equipment Utilized During Treatment: Gait belt Activity Tolerance: Patient tolerated treatment well Patient left: with call bell/phone within reach;in bed;with family/visitor present Nurse  Communication: Mobility status PT Visit Diagnosis: Unsteadiness on feet (R26.81)     Time: 1610-9604 PT Time Calculation (min) (ACUTE ONLY): 11 min  Charges:  $Gait Training: 8-22 mins                     Tonica Brasington M,PT Acute Rehab Services [phone]    Bevelyn Buckles 06/16/2022, 1:01 PM

## 2022-06-16 NOTE — TOC Transition Note (Signed)
Transition of Care [facility]) - CM/SW Discharge Note   Patient Details  Name: Miguel Black MRN: [id number] Date of Birth: 1990/05/22  Transition of Care [facility]) CM/SW Contact:  Lockie Pares, RN Phone Number: 06/16/2022, 9:02 AM   Clinical Narrative:    Patient for OP PT, referral in by [facility].  DC home today.   Final next level of care: Home/Self Care Barriers to Discharge: No Barriers Identified   Patient Goals and CMS Choice CMS Medicare.gov Compare Post Acute Care list provided to:: Patient Choice offered to / list presented to : Patient  Discharge Placement                         Discharge Plan and Services Additional resources added to the After Visit Summary for     Discharge Planning Services: CM Consult Post Acute Care Choice: IP Rehab                    HH Arranged: NA HH Agency: NA        Social Determinants of Health (SDOH) Interventions SDOH Screenings   Depression (PHQ2-9): Low Risk  (06/03/2022)  Tobacco Use: High Risk (06/13/2022)     Readmission Risk Interventions    06/15/2022    2:41 PM  Readmission Risk Prevention Plan  Transportation Screening Complete  PCP or Specialist Appt within 5-7 Days Complete  Home Care Screening Complete  Medication Review (RN CM) Referral to Pharmacy

## 2022-06-16 NOTE — Plan of Care (Signed)
  Problem: Safety: Goal: Non-violent Restraint(s) Outcome: Progressing   Problem: Education: Goal: Knowledge of General Education information will improve Description: Including pain rating scale, medication(s)/side effects and non-pharmacologic comfort measures Outcome: Progressing   Problem: Health Behavior/Discharge Planning: Goal: Ability to manage health-related needs will improve Outcome: Progressing   Problem: Clinical Measurements: Goal: Ability to maintain clinical measurements within normal limits will improve Outcome: Progressing Goal: Will remain free from infection Outcome: Progressing Goal: Diagnostic test results will improve Outcome: Progressing Goal: Respiratory complications will improve Outcome: Progressing Goal: Cardiovascular complication will be avoided Outcome: Progressing   

## 2022-06-16 NOTE — Discharge Summary (Signed)
[facility]  Physician Discharge Summary   Patient ID: Miguel Black MRN: [id number] DOB/AGE: 04-11-1990 32 y.o.  Admit date: 06/13/2022 Discharge date: 06/16/2022    PCP: Lula Olszewski, MD  DISCHARGE DIAGNOSES:    Status epilepticus [facility])   RECOMMENDATIONS FOR OUTPATIENT FOLLOW UP: Patient to follow-up with his neurologist next month.    Home Health: Patient declined short-term rehab at a skilled nursing facility.  He preferred outpatient physical therapy. Equipment/Devices: None  CODE STATUS: Full code  DISCHARGE CONDITION: fair  Diet recommendation: As before  INITIAL HISTORY: 32 y.o. male with a past medical history significant for seizures, prior status epilepticus, multiple sclerosis, and substance abuse who presented to the ED via EMS after being found unresponsive with seizure-like activity.  Per mother patient is noncompliant with Keppra.  She found patient on the floor at 11 AM "shaking" she administered diazepam per rectum when patient did not respond back to baseline EMS was called.  EMS administered an additional 10 mg of Versed.  Post Versed administration on ED arrival patient was able to follow some commands but later displayed recurrent seizure activity.  Patient was emergently intubated for airway protection.  Consultations: Critical care medicine. Neurology  Procedures: EEG  HOSPITAL COURSE:   Patient was hospitalized.  He was initially in the intensive care unit.  Seen by neurology.  Placed on continuous EEG.  Apparently he was noncompliant with Keppra since it was making him irritable.  Patient was started on Briviact.  Zonisamide was being continued.  Patient to be discharged on both of these medications.  Keppra to be discontinued at discharge.  Patient's mentation is back to baseline.  He was seen by PT and OT who recommended skilled nursing facility for short-term rehab.  Patient however declines this.  He prefers to do outpatient PT.   Blood work stable this morning.  Patient wants to go home.  Seems to be stable for discharge.  Stage II pressure injury coccyx Pressure Injury 06/13/22 Coccyx Stage 2 -  Partial thickness loss of dermis presenting as a shallow open injury with a red, pink wound bed without slough. (Active)  06/13/22 1700  Location: Coccyx  Location Orientation:   Staging: Stage 2 -  Partial thickness loss of dermis presenting as a shallow open injury with a red, pink wound bed without slough.  Wound Description (Comments):   Present on Admission: Yes    Patient is stable.  Okay for discharge home today.  PERTINENT LABS:  The results of significant diagnostics from this hospitalization (including imaging, microbiology, ancillary and laboratory) are listed below for reference.    Microbiology: Recent Results (from the past 240 hour(s))  MRSA Next Gen by PCR, Nasal     Status: None   Collection Time: 06/13/22  5:00 PM   Specimen: Nasal Mucosa; Nasal Swab  Result Value Ref Range Status   MRSA by PCR Next Gen NOT DETECTED NOT DETECTED Final    Comment: (NOTE) The GeneXpert MRSA Assay (FDA approved for NASAL specimens only), is one component of a comprehensive MRSA colonization surveillance program. It is not intended to diagnose MRSA infection nor to guide or monitor treatment for MRSA infections. Test performance is not FDA approved in patients less than 64 years old. Performed at [facility], [address] [place], [place] [address]      Labs:   Basic Metabolic Panel: Recent Labs  Lab 06/13/22 1427 06/13/22 1523 06/14/22 0841 06/15/22 0210 06/16/22 0451  NA 138  139 141 141 142  K 4.1 3.7 3.6 3.1* 3.9  CL 104  --  107 106 109  CO2 21*  --  24 24 22   GLUCOSE 98  --  97 100* 100*  BUN 7  --  6 9 9   CREATININE 1.01  --  0.95 0.99 1.06  CALCIUM 8.7*  --  8.4* 8.4* 8.7*  MG 1.8  --  2.0  --  2.0   Liver Function Tests: Recent Labs  Lab 06/13/22 1427  AST 43*  ALT 20   ALKPHOS 65  BILITOT 0.7  PROT 7.1  ALBUMIN 4.2    CBC: Recent Labs  Lab 06/13/22 1427 06/13/22 1523 06/14/22 0841 06/15/22 0210  WBC 14.8*  --  7.8 8.5  NEUTROABS 13.9*  --   --   --   HGB 13.7 12.6* 12.4* 12.7*  HCT 43.3 37.0* 37.5* 38.1*  MCV 100.0  --  94.5 95.3  PLT 268  --  240 212    CBG: Recent Labs  Lab 06/14/22 0341 06/14/22 0746 06/14/22 1127 06/14/22 1916 06/15/22 0721  GLUCAP 95 113* 95 122* 105*     IMAGING STUDIES Overnight EEG with video  Result Date: 06/14/2022 Charlsie Quest, MD     06/15/2022  9:27 AM Patient Name: Miguel Black MRN: [id number] Epilepsy Attending: Charlsie Quest Referring Physician/Provider: Marjorie Smolder, NP Duration: 06/13/2022 2027 to 06/14/2022 2027 Patient history: 32 y.o. male with history of seizures, status epilepticus, substance abuse and relapsing remitting MS who was admitted after having a seizure at home at about 11:00 AM.  EEG to evaluate for seizure. Level of alertness: comatose AEDs during EEG study: LEV, LCM, Propofol Technical aspects: This EEG study was done with scalp electrodes positioned according to the 10-20 International system of electrode placement. Electrical activity was reviewed with band pass filter of 1-70Hz , sensitivity of 7 uV/mm, display speed of 37mm/sec with a 60Hz  notched filter applied as appropriate. EEG data were recorded continuously and digitally stored.  Video monitoring was available and reviewed as appropriate. Description: EEG showed continuous generalized 3 to 6 Hz theta-delta slowing admixed with an excessive amount of  12-15 Hz beta activity distributed symmetrically and diffusely. As sedation was weaned, EEG continued to show generalized 3 to 6 Hz theta-delta slowing admixed with overriding 12-15 Hz beta activity. Additionally rhythmic 3-5hz  theta-delta slowing was also noted in right frontotemporal region. Event button was pressed on 06/14/2022 at 0726 for shivering. Concomitant  EEG before, during and after the event did not show any EEG changes suggest seizure. Hyperventilation and photic stimulation were not performed.   ABNORMALITY - Continuous slow, generalized - Intermittent slow, right frontotemporal region. - Excessive beta, generalized IMPRESSION: This study is suggestive of severe diffuse encephalopathy likely related to sedation. As sedation was weaned, EEG was suggestive of cortical dysfunction in right frontotemporal region.  No seizures or epileptiform discharges were seen throughout the recording. Event button was pressed on 06/14/2022 at 0726 for shivering without concomitant EEG change. This was not an epileptic event. Charlsie Quest   CT Head Wo Contrast  Result Date: 06/13/2022 CLINICAL DATA:  Trauma EXAM: CT HEAD WITHOUT CONTRAST TECHNIQUE: Contiguous axial images were obtained from the base of the skull through the vertex without intravenous contrast. RADIATION DOSE REDUCTION: This exam was performed according to the departmental dose-optimization program which includes automated exposure control, adjustment of the mA and/or kV according to patient size and/or use of iterative reconstruction technique.  COMPARISON:  CT Head 11/14/21 FINDINGS: Limitations: Slightly motion degraded exam. Brain: No evidence of acute infarction, hemorrhage, hydrocephalus, extra-axial collection or mass lesion/mass effect. Redemonstrated periventricular white matter lesions, nonspecific and unchanged on CT from prior exam. Vascular: No hyperdense vessel or unexpected calcification. Skull: Normal. Negative for fracture or focal lesion. Sinuses/Orbits: No middle ear or mastoid effusion. Mucosal thickening in the bilateral ethmoid and right maxillary sinus. Orbits are unremarkable. Other: Partially visualized endotracheal and enteric tubes in place. IMPRESSION: No acute intracranial abnormality. Electronically Signed   By: Lorenza Cambridge M.D.   On: 06/13/2022 15:21   DG Chest Portable 1  View  Result Date: 06/13/2022 CLINICAL DATA:  Endotracheal intubation, seizure EXAM: PORTABLE CHEST 1 VIEW COMPARISON:  04/25/2022 FINDINGS: Endotracheal tube tip is 1.3 cm above the carina. Although not malpositioned, consider retracting 1 cm for safety. A nasogastric tube enters the stomach. The lungs appear clear. Cardiac and mediastinal margins appear normal. No blunting of the costophrenic angles. IMPRESSION: 1. Endotracheal tube tip is 1.3 cm above the carina. Consider retracting 1 cm for safety. 2. Nasogastric tube enters the stomach. 3. No acute thoracic findings. Electronically Signed   By: Gaylyn Rong M.D.   On: 06/13/2022 15:02    DISCHARGE EXAMINATION: Vitals:   06/15/22 2341 06/16/22 0351 06/16/22 0430 06/16/22 0745  BP: (!) 145/86 132/83  128/74  Pulse: (!) 55 (!) 59  65  Resp: 16 15  18   Temp: 98.5 F (36.9 C) 98.5 F (36.9 C)  98.7 F (37.1 C)  TempSrc: Oral Oral  Oral  SpO2: 99% 100%  100%  Weight:   70 kg    General appearance: Awake alert.  In no distress Resp: Clear to auscultation bilaterally.  Normal effort Cardio: S1-S2 is normal regular.  No S3-S4.  No rubs murmurs or bruit GI: Abdomen is soft.  Nontender nondistended.  Bowel sounds are present normal.  No masses organomegaly   DISPOSITION: Home  Discharge Instructions     Ambulatory referral to Physical Therapy   Complete by: As directed    Call MD for:  difficulty breathing, headache or visual disturbances   Complete by: As directed    Call MD for:  extreme fatigue   Complete by: As directed    Call MD for:  persistant dizziness or light-headedness   Complete by: As directed    Call MD for:  persistant nausea and vomiting   Complete by: As directed    Call MD for:  severe uncontrolled pain   Complete by: As directed    Call MD for:  temperature >100.4   Complete by: As directed    Diet general   Complete by: As directed    Discharge instructions   Complete by: As directed    Please take  your medications as prescribed.  Seek attention if you have recurrent seizures.  You were cared for by a hospitalist during your hospital stay. If you have any questions about your discharge medications or the care you received while you were in the hospital after you are discharged, you can call the unit and asked to speak with the hospitalist on call if the hospitalist that took care of you is not available. Once you are discharged, your primary care physician will handle any further medical issues. Please note that NO REFILLS for any discharge medications will be authorized once you are discharged, as it is imperative that you return to your primary care physician (or establish a relationship with a  primary care physician if you do not have one) for your aftercare needs so that they can reassess your need for medications and monitor your lab values. If you do not have a primary care physician, you can call [phone] for a physician referral.   Increase activity slowly   Complete by: As directed    No wound care   Complete by: As directed          Allergies as of 06/16/2022       Reactions   Dalfampridine Other (See Comments)   Seizure 02/2021 while on dalfampridine        Medication List     STOP taking these medications    levETIRAcetam 1000 MG tablet Commonly known as: KEPPRA       TAKE these medications    Briviact 100 MG Tabs tablet Generic drug: brivaracetam Take 1 tablet (100 mg total) by mouth every 12 (twelve) hours.   Ocrevus 300 MG/10ML injection Generic drug: ocrelizumab INFUSE 600MG  IN 0.9% NS INTRAVENOUSLY AT 40ML\HR AND INCREASE BY 40ML\HR EVERY 30 MINUTES (MAX 200ML\HR) OVER AT LEAST 3.5HRS AS DIRECTED EVERY 6 MONTHS.   Valtoco 5 MG Dose 5 MG/0.1ML Liqd Generic drug: diazePAM Place 1 spray into the nose daily as needed.   zonisamide 100 MG capsule Commonly known as: ZONEGRAN Take 2 capsules (200 mg total) by mouth daily.          Follow-up  Information     [facility] Follow up.   Specialty: Rehabilitation Why: Call to schedule resumption of care appointment. Contact information: [address] [place], [place] [phone] mc [place] [address] [phone]        Lula Olszewski, MD. Schedule an appointment as soon as possible for a visit in 1 week(s).   Specialty: Internal Medicine Why: post hospitalization follow up Contact information: [address] [place] [address] [phone]         Asa Lente, MD Follow up on 06/29/2022.   Specialty: Neurology Why: at 2pm on 06/29/2022 Contact information: [address] [place] [address] [phone]                 TOTAL DISCHARGE TIME: 35 minutes  Denece Shearer Rito Ehrlich  [facility] Pager on [email or web address]  06/17/2022, 10:10 AM

## 2022-06-21 ENCOUNTER — Telehealth: Payer: Self-pay

## 2022-06-21 ENCOUNTER — Ambulatory Visit: Payer: Medicare (Managed Care)

## 2022-06-21 DIAGNOSIS — R2681 Unsteadiness on feet: Secondary | ICD-10-CM

## 2022-06-21 DIAGNOSIS — M6281 Muscle weakness (generalized): Secondary | ICD-10-CM | POA: Diagnosis present

## 2022-06-21 NOTE — Therapy (Signed)
OUTPATIENT PHYSICAL THERAPY NEURO TREATMENT   Patient Name: Miguel Black MRN: [id number] DOB:[birth year], 32 y.o., male Today's Date: 06/21/2022   PCP: none listed REFERRING PROVIDER: Asa Lente, MD  END OF SESSION:  PT End of Session - 06/21/22 1318     Visit Number 4    Number of Visits 9    Date for PT Re-Evaluation 06/27/22    Authorization Type [employer] Medicare Advantage; Medicaid secondary    Authorization Time Period 6 visits approved    Authorization - Visit Number 3    Authorization - Number of Visits 6    PT Start Time 1315    PT Stop Time 1400    PT Time Calculation (min) 45 min    Equipment Utilized During Treatment Gait belt             Past Medical History:  Diagnosis Date   Acute encephalopathy 04/26/2022   Acute metabolic encephalopathy 04/26/2022   Acute respiratory failure with hypoxia (HCC) 08/23/2021   Aspiration pneumonia (HCC) 07/12/2018   Closed fracture of distal phalanx or phalanges of hand 02/05/2009   Formatting of this note might be different from the original. Overview: The original diagnosis was replaced automatically by the ICD-10 team to an ICD-10 compliant one. Please review for accuracy   Encounter for screening for HIV 07/12/2018   Foot drop 06/03/2022   Has a left foot drop ever since 2015 first flare of multiple sclerosis had a brace in 2022 custom but does not actually do anything to lift the from the foot physical therapist advised him to get an alternative foot drop brace but I cannot find record of that   Marijuana abuse 05/01/2013   MS (multiple sclerosis) (HCC)    Seizure (HCC)    Seizures (HCC) 01/22/2019   Status epilepticus (HCC) 07/12/2018   Past Surgical History:  Procedure Laterality Date   NO PAST SURGERIES     Patient Active Problem List   Diagnosis Date Noted   Status epilepticus (HCC) 06/13/2022   Ambulates with cane 06/04/2022   Ataxia 06/04/2022   Foot drop 06/03/2022   Vitamin D deficiency  06/03/2022   Smokes cigars 06/03/2022   Neurological deficit present 04/26/2022   History of optic neuritis 07/29/2020   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   High risk medication use 01/28/2020   Seizure disorder (HCC) 12/04/2019   Marijuana use 12/04/2019   Gait disturbance 01/22/2019   Left-sided weakness 01/22/2019   Other fatigue 01/22/2019   Relapsing remitting multiple sclerosis (HCC) 07/12/2018   Hypermetropia of left eye 05/30/2016    ONSET DATE: 04/25/22  REFERRING DIAG: R56.9 (ICD-10-CM) - Seizures; G35 (ICD-10-CM) - Relapsing remitting multiple sclerosis (HCC) R26.9 (ICD-10-CM) - Gait disturbance  THERAPY DIAG:  Unsteadiness on feet  Muscle weakness (generalized)  Rationale for Evaluation and Treatment: Rehabilitation  SUBJECTIVE:  SUBJECTIVE STATEMENT: Pt has been hospitalized x 3 days due to seizure activity. Returns to clinic and reports feeling increased weakness in LLE and LUE. Noticing that every day feeling a bit better   Pt accompanied by: self  PERTINENT HISTORY: MS, seizure activity  PAIN:  Are you having pain? No  PRECAUTIONS: Fall  WEIGHT BEARING RESTRICTIONS: No  FALLS: Has patient fallen in last 6 months? Yes. Number of falls 1  LIVING ENVIRONMENT: Lives with: lives with their family Lives in: House/apartment Stairs: Yes: Internal: 12 steps; bilateral but cannot reach both Has following equipment at home: Single point cane  PLOF: Independent with household mobility with device and supervision for community  PATIENT GOALS: improve balance, endurance, strength  OBJECTIVE:   TODAY'S TREATMENT: 06/21/22 Activity Comments  LE MMT 4/5 left quad 3+/5 left hamstring 3/5 left hip flexion 4/5 hip abd/add (seated)  5xSTS 13 sec  Berg Balance  Test 35/56  TUG 24 sec w/ 2 LOB  165 ft w/ cane and left AFO  Gait training PLS AFO     TODAY'S TREATMENT: 06/07/22 Activity Comments  Sit to stand 3x5 LLE stride 10#  Shadow boxing W/ door frame to cross and slip for coordination  Foot on step 3x30 sec  For LLE stance control  Fast swing phase LLE W/ 4# ankle weight 3x30 sec  High knee drive LLE 1O10 4#  Lateral step and load LLE 1x10 On bosu  Forward load and lunge LLE 1x10 On bosu  Multi-sensory balance challenge    5xSTS test 17 sec         DIAGNOSTIC FINDINGS:   COGNITION: Overall cognitive status: History of cognitive impairments - at baseline   SENSATION: WFL  notes his leg and arm feel "stiff and locked out"  COORDINATION: Grossly impaired LLE: heel to shin, alternating movements   EDEMA:    MUSCLE TONE: LLE: Hypertonic    DTRs:    POSTURE: No Significant postural limitations  LOWER EXTREMITY ROM:     WFL, left ankle not tested due to AFO  LOWER EXTREMITY MMT:    LLE 3/5 gross RLE 5/5  BED MOBILITY:  Independent  TRANSFERS: Assistive device utilized: Single point cane  Sit to stand: Modified independence Stand to sit: Modified independence Chair to chair: Modified independence Floor:  NT    CURB:  Level of Assistance: SBA and CGA Assistive device utilized: Single point cane Curb Comments:   STAIRS: Level of Assistance: Modified independence and SBA Stair Negotiation Technique: Step to Pattern Alternating Pattern  with Bilateral Rails Number of Stairs: 12  Height of Stairs: 4-6"  Comments:   GAIT: Gait pattern: circumduction- Left, genu recurvatum- Left, and abducted- Left Distance walked: 180 ft Assistive device utilized: Single point cane Level of assistance: SBA Comments:   FUNCTIONAL TESTS:  5 times sit to stand: 15 sec w/ pushing legs against chair Timed up and go (TUG): 17 sec Berg Balance Scale: 30/56 : 180 ft w/ cane  1.5 ft/sec = 1.0  mph       GOALS: Goals reviewed with patient? Yes  SHORT TERM GOALS: Target date: 06/06/2022    Demo improved BLE strength and control as evidenced by 12 sec 5xSTS without compensation Baseline:15 sec; 17 sec Goal status: MET  2.  Patient will be independent in HEP to improve functional outcomes Baseline:  Goal status: MET    LONG TERM GOALS: Target date: 06/27/2022    Demo reduced risk for falls per score 45/56 Sharlene Motts  Balance Test Baseline: 30/56; 35/56 Goal status: IN PROGRESS  2.  Reduce risk for falls per time 12 sec TUG test Baseline: 17 sec w/ cane; 24 sec Goal status: IN PROGRESS  3.  Demo improved gait speed/efficiency per distance of 220 ft during Baseline: 180 ft w/ cane and left AFO; (06/21/22) 165 ft w/ cane and AFO Goal status: IN PROGRESS    ASSESSMENT:  CLINICAL IMPRESSION: Patient returns to clinic following recent hospitalization. Re-assessment performed and demonstrates improved LLE strength and time in FTSTS test.  Seemingly there is increased LLE hypertonicity present with demonstrating LLE in abducted posture and cicrumduction in swing.  Pt wears ground-reaction AFO on left and trial of PLS reveals decrease in abducted limb posture and marginally improved circumduction and this also facilitated some improvement in heel strike loading response. Continued sessions to progress POC details.   OBJECTIVE IMPAIRMENTS: Abnormal gait, decreased activity tolerance, decreased balance, decreased coordination, decreased endurance, decreased knowledge of use of DME, difficulty walking, decreased strength, impaired perceived functional ability, impaired tone, improper body mechanics, and postural dysfunction.   ACTIVITY LIMITATIONS: carrying, lifting, standing, squatting, stairs, transfers, reach over head, and locomotion level  PARTICIPATION LIMITATIONS: meal prep, cleaning, laundry, shopping, and community activity  PERSONAL FACTORS: Age and Time since onset  of injury/illness/exacerbation are also affecting patient's functional outcome.   REHAB POTENTIAL: Good  CLINICAL DECISION MAKING: Evolving/moderate complexity  EVALUATION COMPLEXITY: Moderate  PLAN:  PT FREQUENCY: 1-2x/week, plan is 2x/wk x 3 wks, then 1x/wk x 3 wks  PT DURATION: 6 weeks  PLANNED INTERVENTIONS: Therapeutic exercises, Therapeutic activity, Neuromuscular re-education, Balance training, Gait training, Patient/Family education, Self Care, Joint mobilization, Stair training, Vestibular training, Canalith repositioning, Orthotic/Fit training, DME instructions, Aquatic Therapy, Dry Needling, Electrical stimulation, Wheelchair mobility training, Spinal mobilization, Cryotherapy, Moist heat, Ionotophoresis 4mg /ml Dexamethasone, and Manual therapy  PLAN FOR NEXT SESSION: corner balance, tall kneeling   1:18 PM, 06/21/22 M. Shary Decamp, PT, DPT Physical Therapist- [place] Office Number: [phone]

## 2022-06-21 NOTE — Transitions of Care (Post Inpatient/ED Visit) (Signed)
   06/21/2022  Name: Miguel Black MRN: [id number] DOB: [birth year]  Today's TOC FU Call Status: Today's TOC FU Call Status:: Unsuccessul Call (1st Attempt) Unsuccessful Call (1st Attempt) Date: 06/21/22  Attempted to reach the patient regarding the most recent Inpatient/ED visit.  Follow Up Plan: Additional outreach attempts will be made to reach the patient to complete the Transitions of Care (Post Inpatient/ED visit) call.   Signature  Agnes Lawrence, CMA (AAMA)  [facility] [phone]

## 2022-06-23 NOTE — Transitions of Care (Post Inpatient/ED Visit) (Signed)
   06/23/2022  Name: Miguel Black MRN: [id number] DOB: 08-Mar-1990  Today's TOC FU Call Status: Today's TOC FU Call Status:: Unsuccessful Call (2nd Attempt) Unsuccessful Call (1st Attempt) Date: 06/21/22 Unsuccessful Call (2nd Attempt) Date: 06/23/22  Attempted to reach the patient regarding the most recent Inpatient/ED visit.  Follow Up Plan: Additional outreach attempts will be made to reach the patient to complete the Transitions of Care (Post Inpatient/ED visit) call.   Signature Agnes Lawrence, CMA (AAMA)  [facility] [phone]

## 2022-06-27 NOTE — Transitions of Care (Post Inpatient/ED Visit) (Signed)
   06/27/2022  Name: Miguel Black MRN: [id number] DOB: [birth year]  Today's TOC FU Call Status: Today's TOC FU Call Status:: Successful TOC FU Call Competed Unsuccessful Call (1st Attempt) Date: 06/21/22 Unsuccessful Call (2nd Attempt) Date: 06/23/22 Unsuccessful Call (3rd Attempt) Date: 06/27/22 [facility] FU Call Complete Date: 06/27/22  Attempted to reach the patient regarding the most recent Inpatient/ED visit.  Follow Up Plan: No further outreach attempts will be made at this time. We have been unable to contact the patient.  Signature Agnes Lawrence, CMA (AAMA)  [facility] [phone]

## 2022-06-28 ENCOUNTER — Ambulatory Visit: Payer: Medicare (Managed Care) | Attending: Neurology

## 2022-06-28 DIAGNOSIS — R2689 Other abnormalities of gait and mobility: Secondary | ICD-10-CM | POA: Insufficient documentation

## 2022-06-28 DIAGNOSIS — M6281 Muscle weakness (generalized): Secondary | ICD-10-CM | POA: Diagnosis present

## 2022-06-28 DIAGNOSIS — R262 Difficulty in walking, not elsewhere classified: Secondary | ICD-10-CM | POA: Insufficient documentation

## 2022-06-28 DIAGNOSIS — R2681 Unsteadiness on feet: Secondary | ICD-10-CM | POA: Insufficient documentation

## 2022-06-28 NOTE — Therapy (Addendum)
OUTPATIENT PHYSICAL THERAPY NEURO TREATMENT, Progress Note and Recertification   Patient Name: Miguel Black MRN: [id number] DOB:Oct 17, 1990, 32 y.o., male Today's Date: 06/28/2022   PCP: none listed REFERRING PROVIDER: Asa Lente, MD  Progress Note Reporting Period 05/16/22 to 06/28/22  See note below for Objective Data and Assessment of Progress/Goals.      END OF SESSION:  PT End of Session - 06/28/22 1316     Visit Number 5    Number of Visits 12    Date for PT Re-Evaluation 08/09/22    Authorization Type [employer] Medicare Advantage; Medicaid secondary    Authorization Time Period 6 visits approved    Authorization - Visit Number 4    Authorization - Number of Visits 6    PT Start Time 1316    PT Stop Time 1400    PT Time Calculation (min) 44 min    Equipment Utilized During Treatment Gait belt             Past Medical History:  Diagnosis Date   Acute encephalopathy 04/26/2022   Acute metabolic encephalopathy 04/26/2022   Acute respiratory failure with hypoxia (HCC) 08/23/2021   Aspiration pneumonia (HCC) 07/12/2018   Closed fracture of distal phalanx or phalanges of hand 02/05/2009   Formatting of this note might be different from the original. Overview: The original diagnosis was replaced automatically by the ICD-10 team to an ICD-10 compliant one. Please review for accuracy   Encounter for screening for HIV 07/12/2018   Foot drop 06/03/2022   Has a left foot drop ever since 2015 first flare of multiple sclerosis had a brace in 2022 custom but does not actually do anything to lift the from the foot physical therapist advised him to get an alternative foot drop brace but I cannot find record of that   Marijuana abuse 05/01/2013   MS (multiple sclerosis) (HCC)    Seizure (HCC)    Seizures (HCC) 01/22/2019   Status epilepticus (HCC) 07/12/2018   Past Surgical History:  Procedure Laterality Date   NO PAST SURGERIES     Patient Active Problem List    Diagnosis Date Noted   Status epilepticus (HCC) 06/13/2022   Ambulates with cane 06/04/2022   Ataxia 06/04/2022   Foot drop 06/03/2022   Vitamin D deficiency 06/03/2022   Smokes cigars 06/03/2022   Neurological deficit present 04/26/2022   History of optic neuritis 07/29/2020   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   High risk medication use 01/28/2020   Seizure disorder (HCC) 12/04/2019   Marijuana use 12/04/2019   Gait disturbance 01/22/2019   Left-sided weakness 01/22/2019   Other fatigue 01/22/2019   Relapsing remitting multiple sclerosis (HCC) 07/12/2018   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  Increased difficulty with walking.    Pt accompanied by: self  PERTINENT HISTORY: MS, seizure activity  PAIN:  Are you having pain? No  PRECAUTIONS: Fall  WEIGHT BEARING RESTRICTIONS: No  FALLS: Has patient fallen in last 6 months? Yes. Number of falls 1  LIVING ENVIRONMENT: Lives with: lives with their family Lives in: House/apartment Stairs: Yes: Internal: 12 steps; bilateral but cannot reach both Has following equipment at home: Single point cane  PLOF: Independent with household mobility with device and supervision for community  PATIENT GOALS: improve balance, endurance, strength  OBJECTIVE:   TODAY'S TREATMENT:  06/28/22 Activity Comments  Gait training -use of rollator and CGA level surfaces and consistent cues in safety awareness and brake sequence.   -use of RW and CGA level surfaces -use of knee cage left knee to control extension thrust in midstance w/ use of rollator and cane  LE PRE -LAQ 3x10 5# -standing hip flexion 2x10 5# (BUE support) -standing hip abd 2x10 5# (therapist facilitating LLE stance control during movement) -hamstring curls 2x10 5# (therapist facilitating LLE stance control)                 TODAY'S TREATMENT: 06/21/22 Activity Comments  LE MMT 4/5 left quad 3+/5 left hamstring 3/5 left hip flexion 4/5 hip abd/add (seated)  5xSTS 17 sec  Berg Balance Test 35/56  TUG 24 sec w/ 2 LOB  165 ft w/ cane and left AFO  Gait training PLS AFO          DIAGNOSTIC FINDINGS:   COGNITION: Overall cognitive status: History of cognitive impairments - at baseline   SENSATION: WFL  notes his leg and arm feel "stiff and locked out"  COORDINATION: Grossly impaired LLE: heel to shin, alternating movements   EDEMA:    MUSCLE TONE: LLE: Hypertonic    DTRs:    POSTURE: No Significant postural limitations  LOWER EXTREMITY ROM:     WFL, left ankle not tested due to AFO  LOWER EXTREMITY MMT:    LLE 3/5 gross RLE 5/5  BED MOBILITY:  Independent  TRANSFERS: Assistive device utilized: Single point cane  Sit to stand: Modified independence Stand to sit: Modified independence Chair to chair: Modified independence Floor:  NT    CURB:  Level of Assistance: SBA and CGA Assistive device utilized: Single point cane Curb Comments:   STAIRS: Level of Assistance: Modified independence and SBA Stair Negotiation Technique: Step to Pattern Alternating Pattern  with Bilateral Rails Number of Stairs: 12  Height of Stairs: 4-6"  Comments:   GAIT: Gait pattern: circumduction- Left, genu recurvatum- Left, and abducted- Left Distance walked: 180 ft Assistive  device utilized: Single point cane Level of assistance: SBA Comments:   FUNCTIONAL TESTS:  5 times sit to stand: 15 sec w/ pushing legs against chair Timed up and go (TUG): 17 sec Berg Balance Scale: 30/56 : 180 ft w/ cane  1.5 ft/sec = 1.0 mph       GOALS: Goals reviewed with patient? Yes  SHORT TERM GOALS: Target date: 06/06/2022    Demo improved BLE strength and control as evidenced by 12 sec 5xSTS without compensation Baseline:15 sec; 17 sec Goal status: IN PROGRESS  2.  Patient will be independent in HEP to improve functional outcomes Baseline:  Goal status: MET    LONG TERM GOALS: Target date: 06/27/2022    Demo reduced risk for falls per score 45/56 Berg Balance Test Baseline: 30/56; 35/56 Goal  status: IN PROGRESS  2.  Reduce risk for falls per time 12 sec TUG test Baseline: 17 sec w/ cane; 24 sec w/ cane Goal status: IN PROGRESS  3.  Demo improved gait speed/efficiency per distance of 220 ft during Baseline: 180 ft w/ cane and left AFO; (06/21/22) 165 ft w/ cane and AFO Goal status: IN PROGRESS    ASSESSMENT:  CLINICAL IMPRESSION: Returns to clinic and experienced LOB/fall when entering clinic requiring therapist to catch and hold to recover postural control.  Demonstrates significant LLE spasticity with abducted limb posture in stance phase as well as circumduction for LLE swing phase.  Trial of rollator and RW to improve gait stability which was done to good effect.  Demonstrates decreased safety awareness and difficulty in sequencing using rollator requiring CGA for proper brake mgmt and sequence for safety.  Able to improve ambulation to supervision level on level surfaces using RW and knee cage to control extension thrust.  Addressed STG/LTG and demonstrates some improvement in Berg Balance Test score but continues to manifest high risk for falls per outcome measures and significant impairments in ambulation and safety with functional  mobility.  I have requested that patient search his home for a RW that he believes he may already have.  If not, patient would need to request MD order for RW as this would be of greater benefit for safety in walking.  Patient's progress has been delayed and made more difficult by recent hospitalization which has caused a set-back to his clinic attendance and caused an exacerbation of symptoms and impairments.  Patient would benefit from continued PT sessions to address deficits and limitations providing relevant interventions, compensations, and adaptations.   OBJECTIVE IMPAIRMENTS: Abnormal gait, decreased activity tolerance, decreased balance, decreased coordination, decreased endurance, decreased knowledge of use of DME, difficulty walking, decreased strength, impaired perceived functional ability, impaired tone, improper body mechanics, and postural dysfunction.   ACTIVITY LIMITATIONS: carrying, lifting, standing, squatting, stairs, transfers, reach over head, and locomotion level  PARTICIPATION LIMITATIONS: meal prep, cleaning, laundry, shopping, and community activity  PERSONAL FACTORS: Age and Time since onset of injury/illness/exacerbation are also affecting patient's functional outcome.   REHAB POTENTIAL: Good  CLINICAL DECISION MAKING: Evolving/moderate complexity  EVALUATION COMPLEXITY: Moderate  PLAN:  PT FREQUENCY: 1-2x/week,   PT DURATION: 6 weeks  PLANNED INTERVENTIONS: Therapeutic exercises, Therapeutic activity, Neuromuscular re-education, Balance training, Gait training, Patient/Family education, Self Care, Joint mobilization, Stair training, Vestibular training, Canalith repositioning, Orthotic/Fit training, DME instructions, Aquatic Therapy, Dry Needling, Electrical stimulation, Wheelchair mobility training, Spinal mobilization, Cryotherapy, Moist heat, Ionotophoresis 4mg /ml Dexamethasone, and Manual therapy  PLAN FOR NEXT SESSION: gait with walker, Swedish knee cage  for left   2:10 PM, 06/28/22 M. Shary Decamp, PT, DPT Physical Therapist- [place] Office Number: [phone]

## 2022-06-28 NOTE — Addendum Note (Signed)
Addended by: Dion Body on: 06/28/2022 02:15 PM   Modules accepted: Orders

## 2022-06-29 ENCOUNTER — Ambulatory Visit: Payer: Medicare (Managed Care) | Admitting: Neurology

## 2022-06-29 ENCOUNTER — Encounter: Payer: Self-pay | Admitting: Neurology

## 2022-07-05 ENCOUNTER — Ambulatory Visit: Payer: Medicare (Managed Care)

## 2022-07-18 ENCOUNTER — Ambulatory Visit: Payer: Medicare (Managed Care) | Admitting: Physical Therapy

## 2022-07-18 ENCOUNTER — Other Ambulatory Visit (HOSPITAL_BASED_OUTPATIENT_CLINIC_OR_DEPARTMENT_OTHER): Payer: Self-pay

## 2022-07-18 ENCOUNTER — Encounter: Payer: Self-pay | Admitting: Physical Therapy

## 2022-07-18 DIAGNOSIS — R2681 Unsteadiness on feet: Secondary | ICD-10-CM | POA: Diagnosis not present

## 2022-07-18 DIAGNOSIS — M6281 Muscle weakness (generalized): Secondary | ICD-10-CM

## 2022-07-18 DIAGNOSIS — R2689 Other abnormalities of gait and mobility: Secondary | ICD-10-CM

## 2022-07-18 NOTE — Therapy (Signed)
OUTPATIENT PHYSICAL THERAPY NEURO TREATMENT NOTE   Patient Name: Miguel Black MRN: [id number] DOB:1990-06-14, 32 y.o., male Today's Date: 07/18/2022   PCP: none listed REFERRING PROVIDER: Asa Lente, MD   END OF SESSION:  PT End of Session - 07/18/22 1229     Visit Number 6    Number of Visits 12    Date for PT Re-Evaluation 08/09/22    Authorization Type [employer]vantage; Medicaid secondary    Authorization Time Period 6 visits approved    Authorization - Visit Number 5    Authorization - Number of Visits 6    PT Start Time 1233    PT Stop Time 1316    PT Time Calculation (min) 43 min    Equipment Utilized During Treatment Gait belt    Activity Tolerance Patient tolerated treatment well    Behavior During Therapy WFL for tasks assessed/performed             Past Medical History:  Diagnosis Date   Acute encephalopathy 04/26/2022   Acute metabolic encephalopathy 04/26/2022   Acute respiratory failure with hypoxia (HCC) 08/23/2021   Aspiration pneumonia (HCC) 07/12/2018   Closed fracture of distal phalanx or phalanges of hand 02/05/2009   Formatting of this note might be different from the original. Overview: The original diagnosis was replaced automatically by the ICD-10 team to an ICD-10 compliant one. Please review for accuracy   Encounter for screening for HIV 07/12/2018   Foot drop 06/03/2022   Has a left foot drop ever since 2015 first flare of multiple sclerosis had a brace in 2022 custom but does not actually do anything to lift the from the foot physical therapist advised him to get an alternative foot drop brace but I cannot find record of that   Marijuana abuse 05/01/2013   MS (multiple sclerosis) (HCC)    Seizure (HCC)    Seizures (HCC) 01/22/2019   Status epilepticus (HCC) 07/12/2018   Past Surgical History:  Procedure Laterality Date   NO PAST SURGERIES     Patient Active Problem List   Diagnosis Date Noted   Status  epilepticus (HCC) 06/13/2022   Ambulates with cane 06/04/2022   Ataxia 06/04/2022   Foot drop 06/03/2022   Vitamin D deficiency 06/03/2022   Smokes cigars 06/03/2022   Neurological deficit present 04/26/2022   History of optic neuritis 07/29/2020   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   High risk medication use 01/28/2020   Seizure disorder (HCC) 12/04/2019   Marijuana use 12/04/2019   Gait disturbance 01/22/2019   Left-sided weakness 01/22/2019   Other fatigue 01/22/2019   Relapsing remitting multiple sclerosis (HCC) 07/12/2018   Hypermetropia of left eye 05/30/2016    ONSET DATE: 04/25/22  REFERRING DIAG: R56.9 (ICD-10-CM) - Seizures; G35 (ICD-10-CM) - Relapsing remitting multiple sclerosis (HCC) R26.9 (ICD-10-CM) - Gait disturbance  THERAPY DIAG:  Unsteadiness on feet  Muscle weakness (generalized)  Other abnormalities of gait and mobility  Rationale for Evaluation and Treatment: Rehabilitation  SUBJECTIVE:  SUBJECTIVE STATEMENT: No falls.  I've been taking it easy at home. Feel slower and worse movement.  Pt accompanied by: self  PERTINENT HISTORY: MS, seizure activity  PAIN:  Are you having pain? No  PRECAUTIONS: Fall  WEIGHT BEARING RESTRICTIONS: No  FALLS: Has patient fallen in last 6 months? Yes. Number of falls 1  LIVING ENVIRONMENT: Lives with: lives with their family Lives in: House/apartment Stairs: Yes: Internal: 12 steps; bilateral but cannot reach both Has following equipment at home: Single point cane  PLOF: Independent with household mobility with device and supervision for community  PATIENT GOALS: improve balance, endurance, strength  OBJECTIVE:   Gait in and out of clinic lobby to gym area, with pt's cane, with min guard assistance.  Wide  BOS and slowed pace.  Reviewed previous discussion about RW and pt does not feel a RW would help him.  PT reiterated safety concerns with gait and that RW would potentially help with gait safety, especially in the community. TODAY'S TREATMENT: 07/18/2022 Activity Comments  Seated Strengthening BLE -LAQ 3x10 5# -seated march 2 x 10, 5# BUE support, to lessen trunk rotation) -hamstring curls, green theraband 2 x 10 RLE, LLE with green theraband x 10 with compensation through trunk, heelslides no resistance x 10     Sit to stand 2 x 5 reps from mat surface, UE support Cues to keep knees soft upon standing to lessen locking out on LLE  Standing squats 10 reps   Standing hamstring curls 6 reps LLE with assist No weight  Gentle squat in midline, then lateral weightshift and reach, 10 reps UE support on chair      Access Code: [email or web address] URL: [email or web address] Date: 07/18/2022 Prepared by: [facility] - Outpatient  Rehab - [facility]  Exercises - Seated Hip Flexion March with Ankle Weights  - 1 x daily - 3-4 x weekly - 3 sets - 10 reps - Seated Long Arc Quad with Ankle Weight  - 1 x daily - 3-4 x weekly - 3 sets - 10 reps - Seated Hamstring Curl with Anchored Resistance  - 1 x daily - 3-4 x weekly - 3 sets - 10 reps  PATIENT EDUCATION: Education details: HEP, slowed pace of exercises for improved control Person educated: Patient Education method: Explanation, Demonstration, and Handouts Education comprehension: verbalized understanding and returned demonstration  ----------------------------------------------------------------------------    DIAGNOSTIC FINDINGS:   COGNITION: Overall cognitive status: History of cognitive impairments - at baseline   SENSATION: WFL  notes his leg and arm feel "stiff and locked out"  COORDINATION: Grossly impaired LLE: heel to shin, alternating movements   EDEMA:    MUSCLE TONE: LLE: Hypertonic    DTRs:    POSTURE: No  Significant postural limitations  LOWER EXTREMITY ROM:     WFL, left ankle not tested due to AFO  LOWER EXTREMITY MMT:    LLE 3/5 gross RLE 5/5  BED MOBILITY:  Independent  TRANSFERS: Assistive device utilized: Single point cane  Sit to stand: Modified independence Stand to sit: Modified independence Chair to chair: Modified independence Floor:  NT    CURB:  Level of Assistance: SBA and CGA Assistive device utilized: Single point cane Curb Comments:   STAIRS: Level of Assistance: Modified independence and SBA Stair Negotiation Technique: Step to Pattern Alternating Pattern  with Bilateral Rails Number of Stairs: 12  Height of Stairs: 4-6"  Comments:   GAIT: Gait pattern: circumduction- Left, genu recurvatum- Left, and abducted- Left Distance walked: 180 ft Assistive  device utilized: Single point cane Level of assistance: SBA Comments:   FUNCTIONAL TESTS:  5 times sit to stand: 15 sec w/ pushing legs against chair Timed up and go (TUG): 17 sec Berg Balance Scale: 30/56 : 180 ft w/ cane  1.5 ft/sec = 1.0 mph       GOALS: Goals reviewed with patient? Yes  SHORT TERM GOALS: Target date: 06/06/2022    Demo improved BLE strength and control as evidenced by 12 sec 5xSTS without compensation Baseline:15 sec; 17 sec Goal status: IN PROGRESS  2.  Patient will be independent in HEP to improve functional outcomes Baseline:  Goal status: MET    LONG TERM GOALS: Target date: 06/27/2022>08/09/2022 (per recert)    Demo reduced risk for falls per score 45/56 Berg Balance Test Baseline: 30/56; 35/56 Goal status: IN PROGRESS  2.  Reduce risk for falls per time 12 sec TUG test Baseline: 17 sec w/ cane; 24 sec w/ cane Goal status: IN PROGRESS  3.  Demo improved gait speed/efficiency per distance of 220 ft during Baseline: 180 ft w/ cane and left AFO; (06/21/22) 165 ft w/ cane and AFO Goal status: IN PROGRESS    ASSESSMENT:  CLINICAL  IMPRESSION: Pt returns today, with last visit 06/28/2022.  Worked on lower extremity strengthening, with extra time and cues required for exercises to prevent excessive trunk compensations due to LLE weakness.  Worked primarily in sitting and added to HEP for safe seated strengthening exercises.  Worked also in standing for improved quad/hamstring control.  Did not focus on gait in session today, but pt came in again with his cane and reports he does not see how the RW would be able to help him.  Continued to reinforce gait safety as a means for improved overall functional mobility and that RW may be beneficial to this end.  OBJECTIVE IMPAIRMENTS: Abnormal gait, decreased activity tolerance, decreased balance, decreased coordination, decreased endurance, decreased knowledge of use of DME, difficulty walking, decreased strength, impaired perceived functional ability, impaired tone, improper body mechanics, and postural dysfunction.   ACTIVITY LIMITATIONS: carrying, lifting, standing, squatting, stairs, transfers, reach over head, and locomotion level  PARTICIPATION LIMITATIONS: meal prep, cleaning, laundry, shopping, and community activity  PERSONAL FACTORS: Age and Time since onset of injury/illness/exacerbation are also affecting patient's functional outcome.   REHAB POTENTIAL: Good  CLINICAL DECISION MAKING: Evolving/moderate complexity  EVALUATION COMPLEXITY: Moderate  PLAN:  PT FREQUENCY: 1-2x/week,   PT DURATION: 6 weeks  PLANNED INTERVENTIONS: Therapeutic exercises, Therapeutic activity, Neuromuscular re-education, Balance training, Gait training, Patient/Family education, Self Care, Joint mobilization, Stair training, Vestibular training, Canalith repositioning, Orthotic/Fit training, DME instructions, Aquatic Therapy, Dry Needling, Electrical stimulation, Wheelchair mobility training, Spinal mobilization, Cryotherapy, Moist heat, Ionotophoresis 4mg /ml Dexamethasone, and Manual  therapy  PLAN FOR NEXT SESSION: Review HEP and continue to work gait/balance.  Continue to promote gait with walker, try again Swedish knee cage for left knee.  Will need to schedule more appts (and request additional auth to cover POC).   Lonia Blood, PT 07/18/22 2:19 PM Phone: [phone] Fax: [phone]   [facility] at [facility] [address] [place], [address] [place], [place] [address] Phone # [phone] Fax # [phone]

## 2022-07-20 ENCOUNTER — Other Ambulatory Visit (HOSPITAL_COMMUNITY): Payer: Self-pay

## 2022-07-20 ENCOUNTER — Ambulatory Visit: Payer: Medicare (Managed Care) | Admitting: Physical Therapy

## 2022-07-25 ENCOUNTER — Other Ambulatory Visit (HOSPITAL_COMMUNITY): Payer: Self-pay

## 2022-08-04 NOTE — Progress Notes (Signed)
Chief Complaint  Patient presents with   Follow-up    Pt in room 2, mother in room. Here for MS follow up on Ocrevus 600mg  IV q 6 months, walks with cane. Pt mother said pt told her he was having suicidal thoughts, mother believes it the Briviact, wants to discuss changing Rx. Pt has seizure on 07/30/22 mother started patient on vitamin B6 and doing better with walking and memory.    HISTORY OF PRESENT ILLNESS:  08/08/22 ALL:  Miguel Black is a 32 y.o. male here today for follow up for RRMS. He continues Ocrevus infusions and tolerating well. His last infusion was 05/25/2022, next scheduled for 11/23/22. Labs have been stable. MRI brain stable 01/2022.   He feels that he is doing fairly well from MS standpoint. No new or exacerbating symptoms. He continues to have difficulty with balance. He reports ongoing weakness of bilateral lower extremities. Worse in left. He uses cane for long distances but not around the home. PT recently recommended he use a Rolator. He reportedly has one at home but does not think it will help him. He continues to use left swedish knee cage brace. He has a stationary bike but has not used in a while. He continues PT. He switched providers and is resuming therapy, today. He feels it is helpful.  He had a seizure 04/2022. Zonisamide was increased to 200mg  at bedtime. Had another seizure 05/2022 not aborted with recal diazepam. EMS called and administered 10mg  Versed. He was started on Briviact due to reports of irritability and noncompliance with levetiracetam. He tells me, today, that he had another seizure 07/30/2022. He had stopped Briviact as he reports suicidal ideations. Mom administered rectal diazepam. He restarted Briviact about a week ago and reports feeling well. He is also taking B6 which he feels has helped with energy, mood and gait. He denies suicidal ideations. No further seizure activity.   He is sleeping well. Mood is good. No vision, bowel or bladder  changes.   He continues vitamin D 1000iu daily OTC.    HISTORY (copied from Dr Bonnita Hollow previous note)  He is a 32 y.o. man with multiple sclerosis and seizures.  Update 01/05/2022: He feels his MS is stable.     He is on Ocrevus, last dose 11/22/2021.Marland Kitchen   No exacerbations or new neurologic symptoms.   However, he does feel the left leg is a little bit weaker and more spastic than last year which affects his gait   Gait is worse since the seizure in October.  He would like to do more PT.   He uses a cane to ambulate due to ataxia more than weakness though he has both.   He has left leg > arm weakness.   He has left leg stiffness/spasticity as well. He can go across the room without a cane and > 200 feet with it.  Marland Kitchen  He notes some slowing of the left hand and it feels heavier - unchanged.   Bladder function is fine.   He notes vision is reduced on the left and he has color desaturation out of the left eye.   His right eye has good vision.       He had another  seizure 10/222023. He had an aura with this one a minute before and laid down in his bed.  He may had missed a dose before the seizure but now is much more cautious.   In 03/07/2021 he had status epilepticus.  In February, h was on dalfampridine x 1 week and does not think it helped much.    He had GTC.   He remembers the part of  the seizure (more likel the post ictal state) and feeling like being transported to a different dimension ad dreamlike state.  He was intubated and in ICU x 2 days.  He also had Covid at the time.  He still feels weaker than in February.    His previous seizure was 12/03/2019.  Afterwards, he remained groggy and slept 11 hours.   He returned to his baseline over the next day.     He has had 5-6 seizures overall since first one  03/07/2016   He notes fatigue some afternoons but is good in the mornings.  Fatigue is physical > cognitive.   He sleeps well.    He feels focus and attention are good though cognitive skills  sometimes are a little off.   He has mild depression at times.          MS history:  He was diagnosed with relapsing remitting MS in June 2015 after presenting with reduced vision OS while he was in [facility] [place].   In retrospect he had had some neurologic symptoms a few years before the disgnosis.   He had trouble running before his diagnosis.    He was placed on Tecfidera but he had breakthrough activity.     Just a year later, he was switched to Tysabri and has done better.  He moved to [facility] [place]  June 2019 and started seeing the MS Center at [facility] (Dr. Gaynelle Adu).   He has not had any exacerbations and feels his MS has been stable.   Initially, he continue Tysabri.  He tolerated Tysabri well but going back and forth to an infusion center monthly with difficulty.    He has been JCV Ab negative.   His last infusion was off Tysabri was in summer 2019 and he switched to Ocrevus at the end of 2019.   His last JCV Ab test I have a result for is 07/04/2017 (negative 0.14).   I saw him 12/2018 and he transferred infusions to [facility].      Seizure history:  His first one was 2018 and second one was 07/12/2018.   He notes being dehydrated the second one.    Both seizures were GTC by description with LOC.    He slept > a day after each one.   He was started on Keppra but stopped after a week or so due to side effects.  He had a third seizure November 2021 when he was found by his dad in a postictal state.  He was placed on Keppra has continued to take it.   Imaging MRI of the brain 07/13/2018 showed multiple T2/flair hyperintense foci.  Posterior fossa foci are noted in the posterior right medulla, bilateral cerebellar hemispheres, pons, right cerebellar peduncle and cerebral peduncle.  There are foci in the thalamus and multiple foci in the periventricular, juxtacortical and deep white matter.  None of the foci appear to be acute or enhance.   MRI brain 08/30/2020 showed Multiple T2/FLAIR hyperintense foci in  the cerebral hemispheres, thalamus, brainstem and cerebellum in a pattern and configuration consistent with chronic demyelinating plaque associated with multiple sclerosis. None of the foci enhance or appear to be acute. However, 1 focus in the juxtacortical white matter of the posterior left frontal lobe was present on the current scan  but not on the MRI from 07/13/2018    Lab tests: Hepatitis B labs from 2020 at [facility] showed no evidence of chronic infection.  :  TB Gold was negative 01/22/2019.  Vitamin D 01/22/2019 was 30.1.  IgG/IgA/IgM was normal 01/22/2019.  CD19/CD20 showed less then 0.5% of lymphocytes were B cells.   REVIEW OF SYSTEMS: Out of a complete 14 system review of symptoms, the patient complains only of the following symptoms, imbalance, gait difficulty, fatigue, seizure and all other reviewed systems are negative.   ALLERGIES: Allergies  Allergen Reactions   Dalfampridine Other (See Comments)    Seizure 02/2021 while on dalfampridine     HOME MEDICATIONS: Outpatient Medications Prior to Visit  Medication Sig Dispense Refill   diazePAM (VALTOCO 5 MG DOSE) 5 MG/0.1ML LIQD Place 1 spray into the nose daily as needed. 1 each 1   OCREVUS 300 MG/10ML injection INFUSE 600MG  IN 0.9% NS INTRAVENOUSLY AT 40ML\HR AND INCREASE BY 40ML\HR EVERY 30 MINUTES (MAX 200ML\HR) OVER AT LEAST 3.5HRS AS DIRECTED EVERY 6 MONTHS. 20 mL 1   brivaracetam (BRIVIACT) 100 MG TABS tablet Take 1 tablet (100 mg total) by mouth every 12 (twelve) hours. 60 tablet 2   zonisamide (ZONEGRAN) 100 MG capsule Take 2 capsules (200 mg total) by mouth daily. 90 capsule 1   No facility-administered medications prior to visit.     PAST MEDICAL HISTORY: Past Medical History:  Diagnosis Date   Acute encephalopathy 04/26/2022   Acute metabolic encephalopathy 04/26/2022   Acute respiratory failure with hypoxia (HCC) 08/23/2021   Aspiration pneumonia (HCC) 07/12/2018   Closed fracture of distal phalanx  or phalanges of hand 02/05/2009   Formatting of this note might be different from the original. Overview: The original diagnosis was replaced automatically by the ICD-10 team to an ICD-10 compliant one. Please review for accuracy   Encounter for screening for HIV 07/12/2018   Foot drop 06/03/2022   Has a left foot drop ever since 2015 first flare of multiple sclerosis had a brace in 2022 custom but does not actually do anything to lift the from the foot physical therapist advised him to get an alternative foot drop brace but I cannot find record of that   Marijuana abuse 05/01/2013   MS (multiple sclerosis) (HCC)    Seizure (HCC)    Seizures (HCC) 01/22/2019   Status epilepticus (HCC) 07/12/2018     PAST SURGICAL HISTORY: Past Surgical History:  Procedure Laterality Date   NO PAST SURGERIES       FAMILY HISTORY: Family History  Problem Relation Age of Onset   Arthritis Mother    Hypertension Mother    Healthy Father    Healthy Sister    Healthy Brother    Hodgkin's lymphoma Brother    Colon cancer Maternal Uncle 45   Kidney disease Maternal Grandmother    Hypertension Maternal Grandmother    Arthritis Maternal Grandmother    Diabetes Maternal Grandmother    Stroke Maternal Grandfather    Hypertension Maternal Grandfather    Diabetes Paternal Grandmother      SOCIAL HISTORY: Social History   Socioeconomic History   Marital status: Unknown    Spouse name: Not on file   Number of children: 0   Years of education: 11   Highest education level: Not on file  Occupational History   Occupation: Unemployed  Tobacco Use   Smoking status: Some Days    Types: Cigars   Smokeless tobacco: Never  Tobacco comments:    2.5-3 per day  Vaping Use   Vaping status: Never Used  Substance and Sexual Activity   Alcohol use: Yes    Comment: sometimes    Drug use: Yes    Types: Marijuana    Comment: Reports he smokes a lot, cannot put a number to it   Sexual activity: Not  Currently  Other Topics Concern   Not on file  Social History Narrative   Left handed   Coffee every day   Lives with family    Social Determinants of Health   Financial Resource Strain: Not on file  Food Insecurity: Not on file  Transportation Needs: Not on file  Physical Activity: Not on file  Stress: Not on file  Social Connections: Unknown (10/13/2021)   Received from [facility]   Social Network    Social Network: Not on file  Intimate Partner Violence: Unknown (10/13/2021)   Received from [facility]   HITS    Physically Hurt: Not on file    Insult or Talk Down To: Not on file    Threaten Physical Harm: Not on file    Scream or Curse: Not on file     PHYSICAL EXAM  Vitals:   08/08/22 0843  BP: 113/72  Pulse: 75  Weight: 142 lb (64.4 kg)  Height: 5\' 9"  (1.753 m)    Body mass index is 20.97 kg/m.  Generalized: Well developed, in no acute distress  Cardiology: normal rate and rhythm, no murmur auscultated  Respiratory: clear to auscultation bilaterally    Neurological examination  Mentation: Alert oriented to time, place, history taking. Follows all commands speech and language fluent Cranial nerve II-XII: Pupils were equal round reactive to light. Extraocular movements were full, visual field were full on confrontational test. Facial sensation and strength were normal. Head turning and shoulder shrug  were normal and symmetric. Motor: The motor testing reveals 5 over 5 strength of all 4 extremities. Good symmetric motor tone is noted throughout.  Sensory: Sensory testing is intact to soft touch on all 4 extremities. No evidence of extinction is noted.  Coordination: Cerebellar testing reveals ataxic finger-nose-finger and heel-to-shin bilaterally.  Gait and station: Gait is wide and spastic. Uses cane. Unable to Tandem Reflexes: Deep tendon reflexes are symmetric and normal bilaterally.    DIAGNOSTIC DATA (LABS, IMAGING, TESTING) - I reviewed patient  records, labs, notes, testing and imaging myself where available.  Lab Results  Component Value Date   WBC 8.5 06/15/2022   HGB 12.7 (L) 06/15/2022   HCT 38.1 (L) 06/15/2022   MCV 95.3 06/15/2022   PLT 212 06/15/2022      Component Value Date/Time   NA 142 06/16/2022 0451   NA 140 07/22/2019 1502   K 3.9 06/16/2022 0451   CL 109 06/16/2022 0451   CO2 22 06/16/2022 0451   GLUCOSE 100 (H) 06/16/2022 0451   BUN 9 06/16/2022 0451   BUN 12 07/22/2019 1502   CREATININE 1.06 06/16/2022 0451   CREATININE 1.01 06/03/2022 1601   CALCIUM 8.7 (L) 06/16/2022 0451   PROT 7.1 06/13/2022 1427   PROT 7.2 07/22/2019 1502   ALBUMIN 4.2 06/13/2022 1427   ALBUMIN 4.9 07/22/2019 1502   AST 43 (H) 06/13/2022 1427   ALT 20 06/13/2022 1427   ALKPHOS 65 06/13/2022 1427   BILITOT 0.7 06/13/2022 1427   BILITOT <0.2 07/22/2019 1502   GFRNONAA >60 06/16/2022 0451   GFRAA 99 07/22/2019 1502   Lab Results  Component Value Date   CHOL 199 06/03/2022   HDL 69 06/03/2022   LDLCALC 113 (H) 06/03/2022   TRIG 153 (H) 06/14/2022   CHOLHDL 2.9 06/03/2022   Lab Results  Component Value Date   HGBA1C 5.5 08/22/2021   No results found for: "VITAMINB12" Lab Results  Component Value Date   TSH 0.625 01/22/2019        No data to display               No data to display           ASSESSMENT AND PLAN  32 y.o. year old male  has a past medical history of Acute encephalopathy (04/26/2022), Acute metabolic encephalopathy (04/26/2022), Acute respiratory failure with hypoxia (HCC) (08/23/2021), Aspiration pneumonia (HCC) (07/12/2018), Closed fracture of distal phalanx or phalanges of hand (02/05/2009), Encounter for screening for HIV (07/12/2018), Foot drop (06/03/2022), Marijuana abuse (05/01/2013), MS (multiple sclerosis) (HCC), Seizure (HCC), Seizures (HCC) (01/22/2019), and Status epilepticus (HCC) (07/12/2018). here with    Relapsing remitting multiple sclerosis (HCC) - Plan: CBC with  Differential/Platelets, IgG, IgA, IgM  Seizure disorder (HCC)  High risk medication use - Plan: CBC with Differential/Platelets, IgG, IgA, IgM  Gait disturbance  Other fatigue  Vitamin D deficiency - Plan: Vitamin D, 25-hydroxy  Viral is doing well from MS standpoint. We will continue Ocrevus infusions biannually. We will update labs, today. We have discussed previous reports of suicidal ideations on Briviact. He felt levetiracetam made him very irritable and does not wish to try another ASM. He has resumed Briviact with B6 and reports feeling better than he has in a long time. He will continue Briviact 100mg  BID and zonisamide 200mg  at bedtime. Continue diazepam for seizure abortion. May adjust vitamin D level pending labs. He is aware to seek emergency medical attention for any concerns of SI. He was encouraged to continue healthy lifestyle habits. He will follow up with Dr Epimenio Foot in 6 months, sooner if needed.    Orders Placed This Encounter  Procedures   CBC with Differential/Platelets   IgG, IgA, IgM   Vitamin D, 25-hydroxy     Meds ordered this encounter  Medications   zonisamide (ZONEGRAN) 100 MG capsule    Sig: Take 2 capsules (200 mg total) by mouth daily.    Dispense:  180 capsule    Refill:  1    Order Specific Question:   Supervising Provider    Answer:   Anson Fret [[id number]]   brivaracetam (BRIVIACT) 100 MG TABS tablet    Sig: Take 1 tablet (100 mg total) by mouth every 12 (twelve) hours.    Dispense:  180 tablet    Refill:  1    Order Specific Question:   Supervising Provider    Answer:   Anson Fret [[id number]]      Shawnie Dapper, MSN, FNP-C 08/08/2022, 10:49 AM  [facility] [address], [address] [place], [place] [address] [phone]

## 2022-08-04 NOTE — Patient Instructions (Signed)
Below is our plan:  We will continue Ocrevus infusions every 6 months. Please continue Briviact 100mg  twice daily and zonisamide 200mg  daily.   Please make sure you are staying well hydrated. I recommend 50-60 ounces daily. Well balanced diet and regular exercise encouraged. Consistent sleep schedule with 6-8 hours recommended.   Please continue follow up with care team as directed.   Follow up with Dr Epimenio Foot in 3-4 months   You may receive a survey regarding today's visit. I encourage you to leave honest feed back as I do use this information to improve patient care. Thank you for seeing me today!

## 2022-08-08 ENCOUNTER — Encounter: Payer: Self-pay | Admitting: Physical Therapy

## 2022-08-08 ENCOUNTER — Encounter: Payer: Self-pay | Admitting: Family Medicine

## 2022-08-08 ENCOUNTER — Ambulatory Visit: Payer: Medicare (Managed Care) | Attending: Neurology | Admitting: Physical Therapy

## 2022-08-08 ENCOUNTER — Ambulatory Visit (INDEPENDENT_AMBULATORY_CARE_PROVIDER_SITE_OTHER): Payer: Medicare (Managed Care) | Admitting: Family Medicine

## 2022-08-08 VITALS — BP 113/72 | HR 75 | Ht 69.0 in | Wt 142.0 lb

## 2022-08-08 DIAGNOSIS — M6281 Muscle weakness (generalized): Secondary | ICD-10-CM | POA: Insufficient documentation

## 2022-08-08 DIAGNOSIS — R2681 Unsteadiness on feet: Secondary | ICD-10-CM | POA: Diagnosis present

## 2022-08-08 DIAGNOSIS — E559 Vitamin D deficiency, unspecified: Secondary | ICD-10-CM

## 2022-08-08 DIAGNOSIS — R5383 Other fatigue: Secondary | ICD-10-CM

## 2022-08-08 DIAGNOSIS — G35 Multiple sclerosis: Secondary | ICD-10-CM

## 2022-08-08 DIAGNOSIS — Z79899 Other long term (current) drug therapy: Secondary | ICD-10-CM | POA: Diagnosis not present

## 2022-08-08 DIAGNOSIS — R269 Unspecified abnormalities of gait and mobility: Secondary | ICD-10-CM

## 2022-08-08 DIAGNOSIS — G40909 Epilepsy, unspecified, not intractable, without status epilepticus: Secondary | ICD-10-CM

## 2022-08-08 DIAGNOSIS — R2689 Other abnormalities of gait and mobility: Secondary | ICD-10-CM | POA: Insufficient documentation

## 2022-08-08 MED ORDER — ZONISAMIDE 100 MG PO CAPS: 200.0000 mg | ORAL_CAPSULE | Freq: Every day | ORAL | 1 refills | Status: DC

## 2022-08-08 MED ORDER — BRIVARACETAM 100 MG PO TABS: 100.0000 mg | ORAL_TABLET | Freq: Two times a day (BID) | ORAL | 1 refills | Status: DC

## 2022-08-08 NOTE — Therapy (Signed)
OUTPATIENT PHYSICAL THERAPY NEURO TREATMENT NOTE/DISCHARGE SUMMARY   Patient Name: Miguel Black MRN: [id number] DOB:December 08, 1990, 32 y.o., male Today's Date: 08/09/2022   PCP: none listed REFERRING PROVIDER: Asa Lente, MD  PHYSICAL THERAPY DISCHARGE SUMMARY  Visits from Start of Care: 7  Current functional level related to goals / functional outcomes: See below-pt has not met any of the 3 LTGs.  (He has only attended 3 PT sessions since 06/28/2022)   Remaining deficits: Balance, strength, decreased safety awareness, fall risk   Education / Equipment: Educated in [employer]; educated in rollator as likely safer option than cane, but pt report he will not use rollator.   Patient agrees to discharge. Patient goals were not met. Patient is being discharged due to lack of progress.  END OF SESSION:  PT End of Session - 08/08/22 1400     Visit Number 7    Number of Visits 12    Date for PT Re-Evaluation 08/09/22    Authorization Type [employer]vantage; Medicaid secondary    Authorization Time Period 6 visits approved    Authorization - Visit Number 6    Authorization - Number of Visits 6    PT Start Time 1402    PT Stop Time 1444    PT Time Calculation (min) 42 min    Equipment Utilized During Treatment Gait belt    Activity Tolerance Patient tolerated treatment well    Behavior During Therapy WFL for tasks assessed/performed              Past Medical History:  Diagnosis Date   Acute encephalopathy 04/26/2022   Acute metabolic encephalopathy 04/26/2022   Acute respiratory failure with hypoxia (HCC) 08/23/2021   Aspiration pneumonia (HCC) 07/12/2018   Closed fracture of distal phalanx or phalanges of hand 02/05/2009   Formatting of this note might be different from the original. Overview: The original diagnosis was replaced automatically by the ICD-10 team to an ICD-10 compliant one. Please review for accuracy   Encounter for screening for HIV 07/12/2018    Foot drop 06/03/2022   Has a left foot drop ever since 2015 first flare of multiple sclerosis had a brace in 2022 custom but does not actually do anything to lift the from the foot physical therapist advised him to get an alternative foot drop brace but I cannot find record of that   Marijuana abuse 05/01/2013   MS (multiple sclerosis) (HCC)    Seizure (HCC)    Seizures (HCC) 01/22/2019   Status epilepticus (HCC) 07/12/2018   Past Surgical History:  Procedure Laterality Date   NO PAST SURGERIES     Patient Active Problem List   Diagnosis Date Noted   Status epilepticus (HCC) 06/13/2022   Ambulates with cane 06/04/2022   Ataxia 06/04/2022   Foot drop 06/03/2022   Vitamin D deficiency 06/03/2022   Smokes cigars 06/03/2022   Neurological deficit present 04/26/2022   History of optic neuritis 07/29/2020   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   High risk medication use 01/28/2020   Seizure disorder (HCC) 12/04/2019   Marijuana use 12/04/2019   Gait disturbance 01/22/2019   Left-sided weakness 01/22/2019   Other fatigue 01/22/2019   multiple sclerosis 07/12/2018   Hypermetropia of left eye 05/30/2016    ONSET DATE: 04/25/22  REFERRING DIAG: R56.9 (ICD-10-CM) - Seizures; G35 (ICD-10-CM) - Relapsing remitting multiple sclerosis (HCC) R26.9 (ICD-10-CM) - Gait disturbance  THERAPY DIAG:  Unsteadiness on feet  Muscle weakness (generalized)  Other abnormalities  of gait and mobility  Rationale for Evaluation and Treatment: Rehabilitation  SUBJECTIVE:                                                                                                                                                                                             SUBJECTIVE STATEMENT: Saw the neurologist today.  Feel like my walking is improved, especially since taking Vitamin B6. Had a seizure 07/30/2022.  Had a couple of falls.    Pt accompanied by: self  PERTINENT HISTORY: MS, seizure  activity  PAIN:  Are you having pain? No  PRECAUTIONS: Fall  WEIGHT BEARING RESTRICTIONS: No  FALLS: Has patient fallen in last 6 months? Yes. Number of falls 1  LIVING ENVIRONMENT: Lives with: lives with their family Lives in: House/apartment Stairs: Yes: Internal: 12 steps; bilateral but cannot reach both Has following equipment at home: Single point cane  PLOF: Independent with household mobility with device and supervision for community  PATIENT GOALS: improve balance, endurance, strength  OBJECTIVE:   Gait in and out of clinic lobby to gym area, with pt's cane, with min guard assistance.  Wide BOS and slowed pace.  Describes routine of exercise: Doing seated exercise and paying attention to diet.  Has a foot bike but isn't using.  TODAY'S TREATMENT: 08/08/2022 Activity Comments  Berg Balance 40/56 Improved from 35/56  :  170 ft With cane   TUG:  27.31 sec  With cane, increased time from last check  Standing balance exercises: Attempted side step together-pt unsteady and unsafe -wide BOS lateral weightshifting x 10 -stagger stance forward/back weightshifting x 5 reps   Sit<>stand at chair, UE support at counter upon standing, 5 reps Cues for hand placement, slowed pace       [facility] PT Assessment - 08/08/22 1405       Standardized Balance Assessment   Standardized Balance Assessment Berg Balance Test      Berg Balance Test   Sit to Stand Able to stand without using hands and stabilize independently    Standing Unsupported Able to stand safely 2 minutes    Sitting with Back Unsupported but Feet Supported on Floor or Stool Able to sit safely and securely 2 minutes    Stand to Sit Controls descent by using hands    Transfers Able to transfer safely, definite need of hands    Standing Unsupported with Eyes Closed Able to stand 10 seconds safely    Standing Unsupported with Feet Together Able to place feet together independently and stand 1 minute safely    From  Standing, Reach Forward with Outstretched Arm Can reach  forward >12 cm safely (5")    From Standing Position, Pick up Object from Floor Able to pick up shoe safely and easily    From Standing Position, Turn to Look Behind Over each Shoulder Looks behind from both sides and weight shifts well    Turn 360 Degrees Needs close supervision or verbal cueing    Standing Unsupported, Alternately Place Feet on Step/Stool Needs assistance to keep from falling or unable to try    Standing Unsupported, One Foot in Front Able to take small step independently and hold 30 seconds    Standing on One Leg Unable to try or needs assist to prevent fall    Total Score 40    Berg comment: improved from 35/56              Access Code: [email or web address] URL: [email or web address] Date: 08/08/2022 Prepared by: [facility] - Outpatient  Rehab - [facility]  Exercises - Seated Hip Flexion March with Ankle Weights  - 1 x daily - 3-4 x weekly - 3 sets - 10 reps - Seated Long Arc Quad with Ankle Weight  - 1 x daily - 3-4 x weekly - 3 sets - 10 reps - Seated Hamstring Curl with Anchored Resistance  - 1 x daily - 3-4 x weekly - 3 sets - 10 reps - Sit to Stand with Counter Support  - 1 x daily - 5 x weekly - 1-2 sets - 5 reps - Side to side weightshift  - 1 x daily - 5 x weekly - 1-2 sets - 10 reps  PATIENT EDUCATION: Education details: HEP updates, slowed pace of exercises for improved control. Discussed POC, and given decreased overall progress towards goals and pt not keeping frequency of therapy, pt/PT agree to discharge therapy at this time. Person educated: Patient Education method: Explanation, Demonstration, and Handouts Education comprehension: verbalized understanding and returned demonstration  ----------------------------------------------------------------------------    DIAGNOSTIC FINDINGS:   COGNITION: Overall cognitive status: History of cognitive impairments - at  baseline   SENSATION: WFL  notes his leg and arm feel "stiff and locked out"  COORDINATION: Grossly impaired LLE: heel to shin, alternating movements   EDEMA:    MUSCLE TONE: LLE: Hypertonic    DTRs:    POSTURE: No Significant postural limitations  LOWER EXTREMITY ROM:     WFL, left ankle not tested due to AFO  LOWER EXTREMITY MMT:    LLE 3/5 gross RLE 5/5  BED MOBILITY:  Independent  TRANSFERS: Assistive device utilized: Single point cane  Sit to stand: Modified independence Stand to sit: Modified independence Chair to chair: Modified independence Floor:  NT    CURB:  Level of Assistance: SBA and CGA Assistive device utilized: Single point cane Curb Comments:   STAIRS: Level of Assistance: Modified independence and SBA Stair Negotiation Technique: Step to Pattern Alternating Pattern  with Bilateral Rails Number of Stairs: 12  Height of Stairs: 4-6"  Comments:   GAIT: Gait pattern: circumduction- Left, genu recurvatum- Left, and abducted- Left Distance walked: 180 ft Assistive device utilized: Single point cane Level of assistance: SBA Comments:   FUNCTIONAL TESTS:  5 times sit to stand: 15 sec w/ pushing legs against chair Timed up and go (TUG): 17 sec Berg Balance Scale: 30/56 : 180 ft w/ cane  1.5 ft/sec = 1.0 mph       GOALS: Goals reviewed with patient? Yes  SHORT TERM GOALS: Target date: 06/06/2022    Demo improved BLE strength and control as  evidenced by 12 sec 5xSTS without compensation Baseline:15 sec; 17 sec Goal status: IN PROGRESS  2.  Patient will be independent in HEP to improve functional outcomes Baseline:  Goal status: MET    LONG TERM GOALS: Target date: 06/27/2022>08/09/2022 (per recert)    Demo reduced risk for falls per score 45/56 Berg Balance Test Baseline: 30/56; 35/56>40/56 08/08/2022 Goal status: NOT MET 08/08/2022  2.  Reduce risk for falls per time 12 sec TUG test Baseline: 17 sec w/ cane; 24  sec w/ cane; 27.31 sec with TUG 08/08/2022 Goal status: NOT MET  3.  Demo improved gait speed/efficiency per distance of 220 ft during Baseline: 180 ft w/ cane and left AFO; (06/21/22) 165 ft w/ cane and AFO, 170 ft with cane and Left AFO 08/08/2022 Goal status: NOT MET    ASSESSMENT:  CLINICAL IMPRESSION: Pt returns today, after previous visit at end of June 2024. He does report having seizure on 07/30/2022.  Had frank discussion with patient, as he has only had 3 visits since 06/28/2022, that this frequency of therapy is likely not contributing to improved functional mobility.  Assessed LTGs, and pt has not met any of the 3 LTGs.  Provided update to HEP today, but with verbal reminder of seated HEP, it is not clear if pt is doing these regularly.  With pt's decreased progress, inability to keep recommended therapy frequency, and questionable compliance with HEP, PT recommends discharge at this visit.  Pt is in agreement.  *Did recommend consistent performance of HEP and rollator for safety-pt declines using rollator at this time.*  OBJECTIVE IMPAIRMENTS: Abnormal gait, decreased activity tolerance, decreased balance, decreased coordination, decreased endurance, decreased knowledge of use of DME, difficulty walking, decreased strength, impaired perceived functional ability, impaired tone, improper body mechanics, and postural dysfunction.   ACTIVITY LIMITATIONS: carrying, lifting, standing, squatting, stairs, transfers, reach over head, and locomotion level  PARTICIPATION LIMITATIONS: meal prep, cleaning, laundry, shopping, and community activity  PERSONAL FACTORS: Age and Time since onset of injury/illness/exacerbation are also affecting patient's functional outcome.   REHAB POTENTIAL: Good  CLINICAL DECISION MAKING: Evolving/moderate complexity  EVALUATION COMPLEXITY: Moderate  PLAN:  PT FREQUENCY: 1-2x/week,   PT DURATION: 6 weeks  PLANNED INTERVENTIONS: Therapeutic exercises,  Therapeutic activity, Neuromuscular re-education, Balance training, Gait training, Patient/Family education, Self Care, Joint mobilization, Stair training, Vestibular training, Canalith repositioning, Orthotic/Fit training, DME instructions, Aquatic Therapy, Dry Needling, Electrical stimulation, Wheelchair mobility training, Spinal mobilization, Cryotherapy, Moist heat, Ionotophoresis 4mg /ml Dexamethasone, and Manual therapy  PLAN FOR NEXT SESSION: Discharge this visit.   Lonia Blood, PT 08/09/22 1:30 PM Phone: [phone] Fax: [phone]   [facility] [address], [address] [place], [place] [address] Phone # [phone] Fax # [phone]    [facility] Authorization   Choose one: Neuro Rehabilitative  Standardized Assessment or Functional Outcome Tool: See Pain Assessment, Berg, TUG, and Other 2 MWT  Score or Percent Disability: Berg 40/56 (high fall risk), TUG 27.31 sec (high fall risk), 170 ft in  Body Parts Treated (Select each separately):  Other lower extremity Strength . Overall deficits/functional limitations for body part selected: severe Other balance . Overall deficits/functional limitations for body part selected: severe N/A. Overall deficits/functional limitations for body part selected: NA

## 2022-08-09 LAB — CBC WITH DIFFERENTIAL/PLATELET
Basophils Absolute: 0 10*3/uL (ref 0.0–0.2)
Basos: 1 %
EOS (ABSOLUTE): 0.1 10*3/uL (ref 0.0–0.4)
Eos: 1 %
Hematocrit: 43 % (ref 37.5–51.0)
Hemoglobin: 14.6 g/dL (ref 13.0–17.7)
Immature Grans (Abs): 0 10*3/uL (ref 0.0–0.1)
Immature Granulocytes: 0 %
Lymphocytes Absolute: 1.5 10*3/uL (ref 0.7–3.1)
Lymphs: 27 %
MCH: 31.9 pg (ref 26.6–33.0)
MCHC: 34 g/dL (ref 31.5–35.7)
MCV: 94 fL (ref 79–97)
Monocytes Absolute: 0.4 10*3/uL (ref 0.1–0.9)
Monocytes: 8 %
Neutrophils Absolute: 3.5 10*3/uL (ref 1.4–7.0)
Neutrophils: 63 %
Platelets: 300 10*3/uL (ref 150–450)
RBC: 4.58 x10E6/uL (ref 4.14–5.80)
RDW: 12.9 % (ref 11.6–15.4)
WBC: 5.5 10*3/uL (ref 3.4–10.8)

## 2022-08-09 LAB — VITAMIN D 25 HYDROXY (VIT D DEFICIENCY, FRACTURES): Vit D, 25-Hydroxy: 40.9 ng/mL (ref 30.0–100.0)

## 2022-08-09 LAB — IGG, IGA, IGM
IgA/Immunoglobulin A, Serum: 133 mg/dL (ref 90–386)
IgG (Immunoglobin G), Serum: 1112 mg/dL (ref 603–1613)
IgM (Immunoglobulin M), Srm: 20 mg/dL (ref 20–172)

## 2022-09-30 ENCOUNTER — Other Ambulatory Visit: Payer: Self-pay | Admitting: Internal Medicine

## 2022-09-30 DIAGNOSIS — G40909 Epilepsy, unspecified, not intractable, without status epilepticus: Secondary | ICD-10-CM

## 2022-10-03 ENCOUNTER — Other Ambulatory Visit: Payer: Self-pay

## 2022-10-03 DIAGNOSIS — G40909 Epilepsy, unspecified, not intractable, without status epilepticus: Secondary | ICD-10-CM

## 2022-10-03 MED ORDER — VALTOCO 5 MG DOSE 5 MG/0.1ML NA LIQD: 1.0000 | Freq: Every day | NASAL | 1 refills | Status: DC | PRN

## 2022-10-06 ENCOUNTER — Emergency Department (HOSPITAL_COMMUNITY): Payer: Medicare (Managed Care)

## 2022-10-06 ENCOUNTER — Encounter (HOSPITAL_COMMUNITY): Payer: Self-pay

## 2022-10-06 ENCOUNTER — Other Ambulatory Visit: Payer: Self-pay

## 2022-10-06 ENCOUNTER — Inpatient Hospital Stay (HOSPITAL_COMMUNITY)
Admission: EM | Admit: 2022-10-06 | Discharge: 2022-10-09 | DRG: 101 | Disposition: A | Discharge status: Home / Self Care | Payer: Medicare (Managed Care) | Attending: Family Medicine | Admitting: Family Medicine

## 2022-10-06 DIAGNOSIS — G40909 Epilepsy, unspecified, not intractable, without status epilepticus: Secondary | ICD-10-CM | POA: Diagnosis not present

## 2022-10-06 DIAGNOSIS — Z888 Allergy status to other drugs, medicaments and biological substances status: Secondary | ICD-10-CM

## 2022-10-06 DIAGNOSIS — G35A Relapsing-remitting multiple sclerosis: Secondary | ICD-10-CM | POA: Diagnosis present

## 2022-10-06 DIAGNOSIS — R45851 Suicidal ideations: Secondary | ICD-10-CM | POA: Diagnosis present

## 2022-10-06 DIAGNOSIS — F17290 Nicotine dependence, other tobacco product, uncomplicated: Secondary | ICD-10-CM | POA: Diagnosis present

## 2022-10-06 DIAGNOSIS — Z8261 Family history of arthritis: Secondary | ICD-10-CM

## 2022-10-06 DIAGNOSIS — Z833 Family history of diabetes mellitus: Secondary | ICD-10-CM

## 2022-10-06 DIAGNOSIS — R569 Unspecified convulsions: Principal | ICD-10-CM

## 2022-10-06 DIAGNOSIS — M21372 Foot drop, left foot: Secondary | ICD-10-CM | POA: Diagnosis present

## 2022-10-06 DIAGNOSIS — R7401 Elevation of levels of liver transaminase levels: Secondary | ICD-10-CM | POA: Diagnosis present

## 2022-10-06 DIAGNOSIS — Z91199 Patient's noncompliance with other medical treatment and regimen due to unspecified reason: Secondary | ICD-10-CM

## 2022-10-06 DIAGNOSIS — E875 Hyperkalemia: Secondary | ICD-10-CM | POA: Diagnosis present

## 2022-10-06 DIAGNOSIS — Z560 Unemployment, unspecified: Secondary | ICD-10-CM

## 2022-10-06 DIAGNOSIS — G35 Multiple sclerosis: Secondary | ICD-10-CM | POA: Diagnosis present

## 2022-10-06 DIAGNOSIS — G40919 Epilepsy, unspecified, intractable, without status epilepticus: Secondary | ICD-10-CM | POA: Diagnosis present

## 2022-10-06 DIAGNOSIS — T426X6A Underdosing of other antiepileptic and sedative-hypnotic drugs, initial encounter: Secondary | ICD-10-CM | POA: Diagnosis present

## 2022-10-06 DIAGNOSIS — Z823 Family history of stroke: Secondary | ICD-10-CM

## 2022-10-06 DIAGNOSIS — D72829 Elevated white blood cell count, unspecified: Secondary | ICD-10-CM | POA: Diagnosis present

## 2022-10-06 DIAGNOSIS — Z807 Family history of other malignant neoplasms of lymphoid, hematopoietic and related tissues: Secondary | ICD-10-CM

## 2022-10-06 DIAGNOSIS — L89152 Pressure ulcer of sacral region, stage 2: Secondary | ICD-10-CM | POA: Diagnosis present

## 2022-10-06 DIAGNOSIS — Z8249 Family history of ischemic heart disease and other diseases of the circulatory system: Secondary | ICD-10-CM

## 2022-10-06 DIAGNOSIS — Z800 Family history of malignant neoplasm of digestive organs: Secondary | ICD-10-CM

## 2022-10-06 DIAGNOSIS — Z91128 Patient's intentional underdosing of medication regimen for other reason: Secondary | ICD-10-CM

## 2022-10-06 LAB — CBC WITH DIFFERENTIAL/PLATELET
Abs Immature Granulocytes: 0.28 10*3/uL — ABNORMAL HIGH (ref 0.00–0.07)
Basophils Absolute: 0.1 10*3/uL (ref 0.0–0.1)
Basophils Relative: 0 %
Eosinophils Absolute: 0 10*3/uL (ref 0.0–0.5)
Eosinophils Relative: 0 %
HCT: 47.1 % (ref 39.0–52.0)
Hemoglobin: 15.2 g/dL (ref 13.0–17.0)
Immature Granulocytes: 2 %
Lymphocytes Relative: 8 %
Lymphs Abs: 1.3 10*3/uL (ref 0.7–4.0)
MCH: 31.4 pg (ref 26.0–34.0)
MCHC: 32.3 g/dL (ref 30.0–36.0)
MCV: 97.3 fL (ref 80.0–100.0)
Monocytes Absolute: 0.8 10*3/uL (ref 0.1–1.0)
Monocytes Relative: 5 %
Neutro Abs: 14.1 10*3/uL — ABNORMAL HIGH (ref 1.7–7.7)
Neutrophils Relative %: 85 %
Platelets: 265 10*3/uL (ref 150–400)
RBC: 4.84 MIL/uL (ref 4.22–5.81)
RDW: 13.5 % (ref 11.5–15.5)
WBC: 16.5 10*3/uL — ABNORMAL HIGH (ref 4.0–10.5)
nRBC: 0 % (ref 0.0–0.2)

## 2022-10-06 LAB — COMPREHENSIVE METABOLIC PANEL
ALT: 24 U/L (ref 0–44)
AST: 42 U/L — ABNORMAL HIGH (ref 15–41)
Albumin: 4.1 g/dL (ref 3.5–5.0)
Alkaline Phosphatase: 57 U/L (ref 38–126)
Anion gap: 13 (ref 5–15)
BUN: 12 mg/dL (ref 6–20)
CO2: 18 mmol/L — ABNORMAL LOW (ref 22–32)
Calcium: 8.3 mg/dL — ABNORMAL LOW (ref 8.9–10.3)
Chloride: 104 mmol/L (ref 98–111)
Creatinine, Ser: 1.08 mg/dL (ref 0.61–1.24)
GFR, Estimated: >60 mL/min (ref >60)
Glucose, Bld: 110 mg/dL — ABNORMAL HIGH (ref 70–99)
Potassium: 5.3 mmol/L — ABNORMAL HIGH (ref 3.5–5.1)
Sodium: 135 mmol/L (ref 135–145)
Total Bilirubin: 1 mg/dL (ref 0.3–1.2)
Total Protein: 6.7 g/dL (ref 6.5–8.1)

## 2022-10-06 LAB — CBG MONITORING, ED: Glucose-Capillary: 118 mg/dL — ABNORMAL HIGH (ref 70–99)

## 2022-10-06 MED ORDER — LACTATED RINGERS IV BOLUS
1000.0000 mL | Freq: Once | INTRAVENOUS | Status: AC
  Administered 2022-10-06: 1000 mL via INTRAVENOUS

## 2022-10-06 MED ORDER — LEVETIRACETAM IN NACL 1500 MG/100ML IV SOLN
1500.0000 mg | Freq: Once | INTRAVENOUS | Status: AC
  Administered 2022-10-06: 1500 mg via INTRAVENOUS
  Filled 2022-10-06: qty 100

## 2022-10-06 NOTE — ED Notes (Signed)
Patient placed on 2L [place].

## 2022-10-06 NOTE — ED Provider Notes (Signed)
I saw and evaluated the patient, reviewed the resident's note and I agree with the findings and plan.  EKG Interpretation Date/Time:  Thursday October 06 2022 21:02:28 EDT Ventricular Rate:  81 PR Interval:  118 QRS Duration:  86 QT Interval:  381 QTC Calculation: 443 R Axis:   78  Text Interpretation: Sinus rhythm Borderline short PR interval Consider right atrial enlargement Confirmed by Lorre Nick ([id number]) on 10/06/2022 9:03:09 PM       Patient is EKG per interpretation shows normal sinus rhythm.  Rate of 86.  Patient here after a witnessed seizure by relatives at home.  According to EMS, patient has not been compliant with his antiepileptics.  He was given 5 of Versed.  On arrival here, he is somnolent.  He is protecting his airway.  Will load with Keppra and reassess   Lorre Nick, MD 10/06/22 2104

## 2022-10-06 NOTE — ED Triage Notes (Signed)
Patient BIB from home due to seizures. Patient actively seizing upon EMS arrival received 5mg  versed. EMS reports family stated patient has not been taking seizure medication. Hx MS. Patient seizing at bridge and received 2.5 versed IV via ems. Patient received 4mg  zofran. Patient withdrawing from pain.

## 2022-10-06 NOTE — ED Provider Notes (Signed)
[place] EMERGENCY DEPARTMENT AT [facility] Provider Note   CSN: [id number] Arrival date & time: 10/06/22  2047     History  Chief Complaint  Patient presents with   Seizures    Miguel Black is a 32 y.o. male.   Seizures  Patient is a 32 year old male with a history of multiple sclerosis, seizure disorder presenting for seizure.  Per EMS report, he was found to have generalized tonic-clonic shaking therefore he was given 5 mg of Versed.  This did terminate the seizure however shortly prior to arrival to the ED there is concern for recurrent nystagmus and posturing therefore he was redosed with 2.5 mg of Versed.     Home Medications Prior to Admission medications   Medication Sig Start Date End Date Taking? Authorizing Provider  brivaracetam (BRIVIACT) 100 MG TABS tablet Take 1 tablet (100 mg total) by mouth every 12 (twelve) hours. 08/08/22   Lomax, Amy, NP  diazePAM (VALTOCO 5 MG DOSE) 5 MG/0.1ML LIQD Place 1 spray into the nose daily as needed. 10/03/22   Lula Olszewski, MD  OCREVUS 300 MG/10ML injection INFUSE 600MG  IN 0.9% NS INTRAVENOUSLY AT 40ML\HR AND INCREASE BY 40ML\HR EVERY 30 MINUTES (MAX 200ML\HR) OVER AT LEAST 3.5HRS AS DIRECTED EVERY 6 MONTHS. 06/07/21   Sater, Pearletha Furl, MD  zonisamide (ZONEGRAN) 100 MG capsule Take 2 capsules (200 mg total) by mouth daily. 08/08/22   Shawnie Dapper, NP      Allergies    Dalfampridine    Review of Systems   Review of Systems  Neurological:  Positive for seizures.    Physical Exam Updated Vital Signs BP 110/61   Pulse 65   Temp 98.8 F (37.1 C) (Axillary)   Resp 18   Ht 5\' 11"  (1.803 m)   Wt 65 kg   SpO2 99%   BMI 19.99 kg/m  Physical Exam Vitals and nursing note reviewed.  Constitutional:      General: He is in acute distress.     Appearance: He is well-developed. He is ill-appearing.  HENT:     Head: Normocephalic and atraumatic.  Eyes:     Conjunctiva/sclera: Conjunctivae normal.   Cardiovascular:     Rate and Rhythm: Normal rate and regular rhythm.  Pulmonary:     Effort: Pulmonary effort is normal. No respiratory distress.     Breath sounds: Rhonchi present.  Abdominal:     Palpations: Abdomen is soft.     Tenderness: There is no abdominal tenderness.  Musculoskeletal:        General: No swelling.     Cervical back: Neck supple.  Skin:    General: Skin is warm and dry.     Capillary Refill: Capillary refill takes less than 2 seconds.  Neurological:     Mental Status: He is lethargic.     GCS: GCS eye subscore is 1. GCS verbal subscore is 1. GCS motor subscore is 5.     ED Results / Procedures / Treatments   Labs (all labs ordered are listed, but only abnormal results are displayed) Labs Reviewed  CBC WITH DIFFERENTIAL/PLATELET - Abnormal; Notable for the following components:      Result Value   WBC 16.5 (*)    Neutro Abs 14.1 (*)    Abs Immature Granulocytes 0.28 (*)    All other components within normal limits  COMPREHENSIVE METABOLIC PANEL - Abnormal; Notable for the following components:   Potassium 5.3 (*)    CO2 18 (*)  Glucose, Bld 110 (*)    Calcium 8.3 (*)    AST 42 (*)    All other components within normal limits  CBG MONITORING, ED - Abnormal; Notable for the following components:   Glucose-Capillary 118 (*)    All other components within normal limits    EKG EKG Interpretation Date/Time:  Thursday October 06 2022 21:02:28 EDT Ventricular Rate:  81 PR Interval:  118 QRS Duration:  86 QT Interval:  381 QTC Calculation: 443 R Axis:   78  Text Interpretation: Sinus rhythm Borderline short PR interval Consider right atrial enlargement Confirmed by Lorre Nick ([id number]) on 10/06/2022 9:03:09 PM  Radiology CT Head Wo Contrast  Result Date: 10/06/2022 CLINICAL DATA:  Altered mental status EXAM: CT HEAD WITHOUT CONTRAST TECHNIQUE: Contiguous axial images were obtained from the base of the skull through the vertex without  intravenous contrast. RADIATION DOSE REDUCTION: This exam was performed according to the departmental dose-optimization program which includes automated exposure control, adjustment of the mA and/or kV according to patient size and/or use of iterative reconstruction technique. COMPARISON:  06/13/2022 FINDINGS: Brain: No evidence of acute infarction, hemorrhage, hydrocephalus, extra-axial collection or mass lesion/mass effect. Unchanged periventricular white matter hypoattenuation, worst in the left periatrial region. Vascular: No hyperdense vessel or unexpected calcification. Skull: Normal. Negative for fracture or focal lesion. Sinuses/Orbits: No acute finding. Other: None. IMPRESSION: 1. No acute intracranial abnormality. 2. Unchanged periventricular white matter hypoattenuation, worst in the left periatrial region. This is in keeping with the reported diagnosis of multiple sclerosis. Electronically Signed   By: Deatra Robinson M.D.   On: 10/06/2022 23:55    Procedures Procedures    Medications Ordered in ED Medications  levETIRAcetam (KEPPRA) IVPB 1500 mg/ 100 mL premix (has no administration in time range)  levETIRAcetam (KEPPRA) IVPB 1500 mg/ 100 mL premix (0 mg Intravenous Stopped 10/06/22 2148)  lactated ringers bolus 1,000 mL (0 mLs Intravenous Stopped 10/06/22 2351)    ED Course/ Medical Decision Making/ A&P                                 Medical Decision Making Amount and/or Complexity of Data Reviewed Labs: ordered. Radiology: ordered.  Risk Prescription drug management. Decision regarding hospitalization.   Patient is a 32 year old male with a history of multiple sclerosis and seizure disorder presenting for a seizure.  On my initial evaluation, he is afebrile, hemodynamically stable.  He is maintaining appropriate oxygen saturation on 2 L nasal cannula and responds to pain by localizing but does not have other response.  I spoke with EMS on their arrival and they report that  when they first got to patient in the field he was having generalized tonic-clonic movements therefore was given 5 mg of Versed.  Due to recurrent abnormal movements concerning for potential seizure activity they did be dosed 2.5 mg shortly prior to arrival here.  They did receive report from family members on the scene who stated that patient has not been taking his antiepileptic.  On my initial evaluation of patient, administered 20 mg/kg dose of IV Keppra.  Additionally gave 1 L fluid bolus.  Basic labs obtained and demonstrate mild leukocytosis, likely reactive in the setting of seizure as well as minimal hyperkalemia again likely related to hemolysis.  CT head is without acute intracranial abnormality.  I spoke then with patient's mother Miguel Black.  She reports that patient has stopped taking his antiepileptic medications  because he is concerned that they make him more depressed.  She has tried to emphasize to him the importance of taking them however he has been unwilling to do so.  She does report that the seizure activity that she saw today is consistent with his baseline seizures.  Patient serially reassessed over his ED course.  After 3 hours, he does swat my hand away to sternal rub however does not respond to voice and will not speak to respond to pain or answer questions.  Given prolonged postictal period, I did engage neurology.  Given his multiple doses of benzodiazepines as well as known seizure disorder without evidence of active seizing at this time, they do recommend ongoing ED observation.  Recommend continuing to redosed IV Keppra while he is here.  I ordered an additional dose of Keppra for 5 AM and handed off to oncoming team for further observation with plan for potential admission if he fails to return to baseline.        Final Clinical Impression(s) / ED Diagnoses Final diagnoses:  Seizure [facility])    Rx / DC Orders ED Discharge Orders     None          Claretha Cooper, DO 10/07/22 0053    Lorre Nick, MD 10/07/22 [phone]

## 2022-10-07 ENCOUNTER — Emergency Department (HOSPITAL_COMMUNITY): Payer: Medicare (Managed Care)

## 2022-10-07 ENCOUNTER — Observation Stay (HOSPITAL_COMMUNITY): Payer: Medicare (Managed Care)

## 2022-10-07 DIAGNOSIS — G40919 Epilepsy, unspecified, intractable, without status epilepticus: Secondary | ICD-10-CM | POA: Diagnosis not present

## 2022-10-07 DIAGNOSIS — R569 Unspecified convulsions: Secondary | ICD-10-CM

## 2022-10-07 DIAGNOSIS — D72829 Elevated white blood cell count, unspecified: Secondary | ICD-10-CM | POA: Diagnosis present

## 2022-10-07 DIAGNOSIS — G35 Multiple sclerosis: Secondary | ICD-10-CM | POA: Diagnosis not present

## 2022-10-07 DIAGNOSIS — R7401 Elevation of levels of liver transaminase levels: Secondary | ICD-10-CM | POA: Diagnosis present

## 2022-10-07 LAB — URINALYSIS, ROUTINE W REFLEX MICROSCOPIC
Bilirubin Urine: NEGATIVE
Glucose, UA: NEGATIVE mg/dL
Hgb urine dipstick: NEGATIVE
Ketones, ur: NEGATIVE mg/dL
Leukocytes,Ua: NEGATIVE
Nitrite: NEGATIVE
Protein, ur: NEGATIVE mg/dL
Specific Gravity, Urine: 1.02 (ref 1.005–1.030)
pH: 6 (ref 5.0–8.0)

## 2022-10-07 LAB — CK: Total CK: 510 U/L — ABNORMAL HIGH (ref 49–397)

## 2022-10-07 MED ORDER — ONDANSETRON HCL 4 MG PO TABS: 4.0000 mg | ORAL_TABLET | Freq: Four times a day (QID) | ORAL | Status: DC | PRN

## 2022-10-07 MED ORDER — ENOXAPARIN SODIUM 40 MG/0.4ML IJ SOSY
40.0000 mg | PREFILLED_SYRINGE | INTRAMUSCULAR | Status: DC
  Administered 2022-10-07 – 2022-10-08 (×2): 40 mg via SUBCUTANEOUS
  Filled 2022-10-07 (×2): qty 0.4

## 2022-10-07 MED ORDER — SODIUM CHLORIDE 0.9 % IV SOLN: INTRAVENOUS | Status: DC

## 2022-10-07 MED ORDER — IOHEXOL 350 MG/ML SOLN
75.0000 mL | Freq: Once | INTRAVENOUS | Status: AC | PRN
  Administered 2022-10-07: 75 mL via INTRAVENOUS

## 2022-10-07 MED ORDER — ACETAMINOPHEN 325 MG PO TABS: 650.0000 mg | ORAL_TABLET | Freq: Four times a day (QID) | ORAL | Status: DC | PRN

## 2022-10-07 MED ORDER — SODIUM CHLORIDE 0.9% FLUSH
3.0000 mL | Freq: Two times a day (BID) | INTRAVENOUS | Status: DC
  Administered 2022-10-07 – 2022-10-09 (×4): 3 mL via INTRAVENOUS

## 2022-10-07 MED ORDER — ALBUTEROL SULFATE (2.5 MG/3ML) 0.083% IN NEBU: 2.5000 mg | INHALATION_SOLUTION | Freq: Four times a day (QID) | RESPIRATORY_TRACT | Status: DC | PRN

## 2022-10-07 MED ORDER — CALCIUM GLUCONATE-NACL 2-0.675 GM/100ML-% IV SOLN
2.0000 g | Freq: Once | INTRAVENOUS | Status: AC
  Administered 2022-10-07: 2000 mg via INTRAVENOUS
  Filled 2022-10-07: qty 100

## 2022-10-07 MED ORDER — LORAZEPAM 2 MG/ML IJ SOLN: 1.0000 mg | INTRAMUSCULAR | Status: DC | PRN

## 2022-10-07 MED ORDER — ACETAMINOPHEN 650 MG RE SUPP: 650.0000 mg | Freq: Four times a day (QID) | RECTAL | Status: DC | PRN

## 2022-10-07 MED ORDER — ONDANSETRON HCL 4 MG/2ML IJ SOLN: 4.0000 mg | Freq: Four times a day (QID) | INTRAMUSCULAR | Status: DC | PRN

## 2022-10-07 MED ORDER — LEVETIRACETAM IN NACL 1500 MG/100ML IV SOLN
1500.0000 mg | Freq: Two times a day (BID) | INTRAVENOUS | Status: DC
  Administered 2022-10-07: 1500 mg via INTRAVENOUS
  Filled 2022-10-07: qty 100

## 2022-10-07 MED ORDER — LEVETIRACETAM IN NACL 500 MG/100ML IV SOLN
500.0000 mg | Freq: Two times a day (BID) | INTRAVENOUS | Status: DC
  Administered 2022-10-07 – 2022-10-08 (×2): 500 mg via INTRAVENOUS
  Filled 2022-10-07 (×2): qty 100

## 2022-10-07 NOTE — ED Notes (Signed)
ED TO INPATIENT HANDOFF REPORT  ED Nurse Name and Phone #: [phone]  S Name/Age/Gender Miguel Black 32 y.o. male Room/Bed: 021C/021C  Code Status   Code Status: Prior  Home/SNF/Other Home Patient alert, not oriented. Is this baseline? No   Triage Complete: Triage complete  Chief Complaint 32 yo M actively seizing upon EMS arrival given 5 mg versed post ictal but possible aspiration, BP 112/62 HR 102  Triage Note Patient BIB from home due to seizures. Patient actively seizing upon EMS arrival received 5mg  versed. EMS reports family stated patient has not been taking seizure medication. Hx MS. Patient seizing at bridge and received 2.5 versed IV via ems. Patient received 4mg  zofran. Patient withdrawing from pain.   Allergies Allergies  Allergen Reactions   Dalfampridine Other (See Comments)    Seizure 02/2021 while on dalfampridine    Level of Care/Admitting Diagnosis ED Disposition     ED Disposition  Admit   Condition  --   Comment  The patient appears reasonably stabilized for admission considering the current resources, flow, and capabilities available in the ED at this time, and I doubt any other [facility] requiring further screening and/or treatment in the ED prior to admission is  present.          B Medical/Surgery History Past Medical History:  Diagnosis Date   Acute encephalopathy 04/26/2022   Acute metabolic encephalopathy 04/26/2022   Acute respiratory failure with hypoxia (HCC) 08/23/2021   Aspiration pneumonia (HCC) 07/12/2018   Closed fracture of distal phalanx or phalanges of hand 02/05/2009   Formatting of this note might be different from the original. Overview: The original diagnosis was replaced automatically by the ICD-10 team to an ICD-10 compliant one. Please review for accuracy   Encounter for screening for HIV 07/12/2018   Foot drop 06/03/2022   Has a left foot drop ever since 2015 first flare of multiple sclerosis had a brace in 2022  custom but does not actually do anything to lift the from the foot physical therapist advised him to get an alternative foot drop brace but I cannot find record of that   Marijuana abuse 05/01/2013   MS (multiple sclerosis) (HCC)    Seizure (HCC)    Seizures (HCC) 01/22/2019   Status epilepticus (HCC) 07/12/2018   Past Surgical History:  Procedure Laterality Date   NO PAST SURGERIES       A IV Location/Drains/Wounds Patient Lines/Drains/Airways Status     Active Line/Drains/Airways     Name Placement date Placement time Site Days   Peripheral IV 10/06/22 18 G Anterior;Left Forearm 10/06/22  --  Forearm  1   Peripheral IV 10/06/22 20 G Anterior;Right Forearm 10/06/22  2058  Forearm  1   Pressure Injury 06/13/22 Coccyx Stage 2 -  Partial thickness loss of dermis presenting as a shallow open injury with a red, pink wound bed without slough. 06/13/22  1700  -- 116            Intake/Output Last 24 hours  Intake/Output Summary (Last 24 hours) at 10/07/2022 0707 Last data filed at 10/07/2022 0558 Gross per 24 hour  Intake 1196.39 ml  Output --  Net 1196.39 ml    Labs/Imaging Results for orders placed or performed during the hospital encounter of 10/06/22 (from the past 48 hour(s))  CBC with Differential     Status: Abnormal   Collection Time: 10/06/22  9:01 PM  Result Value Ref Range   WBC 16.5 (H) 4.0 - 10.5  K/uL   RBC 4.84 4.22 - 5.81 MIL/uL   Hemoglobin 15.2 13.0 - 17.0 g/dL   HCT 72.5 36.6 - 44.0 %   MCV 97.3 80.0 - 100.0 fL   MCH 31.4 26.0 - 34.0 pg   MCHC 32.3 30.0 - 36.0 g/dL   RDW 34.7 42.5 - 95.6 %   Platelets 265 150 - 400 K/uL   nRBC 0.0 0.0 - 0.2 %   Neutrophils Relative % 85 %   Neutro Abs 14.1 (H) 1.7 - 7.7 K/uL   Lymphocytes Relative 8 %   Lymphs Abs 1.3 0.7 - 4.0 K/uL   Monocytes Relative 5 %   Monocytes Absolute 0.8 0.1 - 1.0 K/uL   Eosinophils Relative 0 %   Eosinophils Absolute 0.0 0.0 - 0.5 K/uL   Basophils Relative 0 %   Basophils Absolute  0.1 0.0 - 0.1 K/uL   Immature Granulocytes 2 %   Abs Immature Granulocytes 0.28 (H) 0.00 - 0.07 K/uL    Comment: Performed at [facility], [address], [place], [place] [address]  Comprehensive metabolic panel     Status: Abnormal   Collection Time: 10/06/22  9:01 PM  Result Value Ref Range   Sodium 135 135 - 145 mmol/L   Potassium 5.3 (H) 3.5 - 5.1 mmol/L    Comment: HEMOLYSIS AT THIS LEVEL MAY AFFECT RESULT   Chloride 104 98 - 111 mmol/L   CO2 18 (L) 22 - 32 mmol/L   Glucose, Bld 110 (H) 70 - 99 mg/dL    Comment: Glucose reference range applies only to samples taken after fasting for at least 8 hours.   BUN 12 6 - 20 mg/dL   Creatinine, Ser 4.33 0.61 - 1.24 mg/dL   Calcium 8.3 (L) 8.9 - 10.3 mg/dL   Total Protein 6.7 6.5 - 8.1 g/dL   Albumin 4.1 3.5 - 5.0 g/dL   AST 42 (H) 15 - 41 U/L    Comment: HEMOLYSIS AT THIS LEVEL MAY AFFECT RESULT   ALT 24 0 - 44 U/L    Comment: HEMOLYSIS AT THIS LEVEL MAY AFFECT RESULT   Alkaline Phosphatase 57 38 - 126 U/L   Total Bilirubin 1.0 0.3 - 1.2 mg/dL    Comment: HEMOLYSIS AT THIS LEVEL MAY AFFECT RESULT   GFR, Estimated >60 >60 mL/min    Comment: (NOTE) Calculated using the CKD-EPI Creatinine Equation (2021)    Anion gap 13 5 - 15    Comment: Performed at [facility], [address], [place], [place] [address]  CBG monitoring, ED     Status: Abnormal   Collection Time: 10/06/22  9:02 PM  Result Value Ref Range   Glucose-Capillary 118 (H) 70 - 99 mg/dL    Comment: Glucose reference range applies only to samples taken after fasting for at least 8 hours.   CT ANGIO HEAD NECK W WO CM  Result Date: 10/07/2022 CLINICAL DATA:  Acute stroke suspected EXAM: CT ANGIOGRAPHY HEAD AND NECK WITH AND WITHOUT CONTRAST TECHNIQUE: Multidetector CT imaging of the head and neck was performed using the standard protocol during bolus administration of intravenous contrast. Multiplanar CT image reconstructions and MIPs were obtained to evaluate  the vascular anatomy. Carotid stenosis measurements (when applicable) are obtained utilizing NASCET criteria, using the distal internal carotid diameter as the denominator. RADIATION DOSE REDUCTION: This exam was performed according to the departmental dose-optimization program which includes automated exposure control, adjustment of the mA and/or kV according to patient size  and/or use of iterative reconstruction technique. CONTRAST:  75mL OMNIPAQUE IOHEXOL 350 MG/ML SOLN COMPARISON:  Head CT from yesterday FINDINGS: CT HEAD FINDINGS Brain: No evidence of acute infarction, hemorrhage, hydrocephalus, extra-axial collection or mass lesion/mass effect. Low densities in the cerebral white matter from multiple sclerosis by prior imaging. These are underestimated relative to brain MRI earlier this year. Vascular: No hyperdense vessel or unexpected calcification. Skull: Normal. Negative for fracture or focal lesion. Sinuses/Orbits: No acute finding. Review of the MIP images confirms the above findings CTA NECK FINDINGS Aortic arch: Normal with 3 vessel branching Right carotid system: Mild right ICA indentation due to stylohyoid ligament ossification. No stenosis, beading, or ulceration Left carotid system: Vessels are smoothly contoured and widely patent. No atheromatous changes Vertebral arteries: No proximal subclavian or vertebral stenosis. No atheromatous changes. Skeleton: Unremarkable Other neck: Unremarkable Upper chest: Clear apical lungs Review of the MIP images confirms the above findings CTA HEAD FINDINGS Anterior circulation: No significant stenosis, proximal occlusion, aneurysm, or vascular malformation. Posterior circulation: No significant stenosis, proximal occlusion, aneurysm, or vascular malformation. Venous sinuses: As permitted by contrast timing, patent. Review of the MIP images confirms the above findings IMPRESSION: Negative CTA.  No explanation for symptoms. Electronically Signed   By: Tiburcio Pea M.D.   On: 10/07/2022 05:36   CT Head Wo Contrast  Result Date: 10/06/2022 CLINICAL DATA:  Altered mental status EXAM: CT HEAD WITHOUT CONTRAST TECHNIQUE: Contiguous axial images were obtained from the base of the skull through the vertex without intravenous contrast. RADIATION DOSE REDUCTION: This exam was performed according to the departmental dose-optimization program which includes automated exposure control, adjustment of the mA and/or kV according to patient size and/or use of iterative reconstruction technique. COMPARISON:  06/13/2022 FINDINGS: Brain: No evidence of acute infarction, hemorrhage, hydrocephalus, extra-axial collection or mass lesion/mass effect. Unchanged periventricular white matter hypoattenuation, worst in the left periatrial region. Vascular: No hyperdense vessel or unexpected calcification. Skull: Normal. Negative for fracture or focal lesion. Sinuses/Orbits: No acute finding. Other: None. IMPRESSION: 1. No acute intracranial abnormality. 2. Unchanged periventricular white matter hypoattenuation, worst in the left periatrial region. This is in keeping with the reported diagnosis of multiple sclerosis. Electronically Signed   By: Deatra Robinson M.D.   On: 10/06/2022 23:55    Pending Labs Unresulted Labs (From admission, onward)    None       Vitals/Pain Today's Vitals   10/07/22 0345 10/07/22 0430 10/07/22 0500 10/07/22 0545  BP: 101/66 105/60 93/69 102/75  Pulse: (!) 59 63 60 (!) 58  Resp: 16  16 13   Temp:   98.2 F (36.8 C)   TempSrc:      SpO2: 100% 100% 96% 98%  Weight:      Height:        Isolation Precautions No active isolations  Medications Medications  levETIRAcetam (KEPPRA) IVPB 1500 mg/ 100 mL premix (0 mg Intravenous Stopped 10/07/22 0558)  levETIRAcetam (KEPPRA) IVPB 1500 mg/ 100 mL premix (0 mg Intravenous Stopped 10/06/22 2148)  lactated ringers bolus 1,000 mL (0 mLs Intravenous Stopped 10/06/22 2351)  iohexol (OMNIPAQUE) 350 MG/ML  injection 75 mL (75 mLs Intravenous Contrast Given 10/07/22 0531)    Mobility walks     Focused Assessments     R Recommendations: See Admitting Provider Note  Report given to:   Additional Notes:  Prolonged Postictal, patient moves and moans, however has not spoke words. Family stated it usually take 24 hours to come out of it. Patient non-compliant  with seizure medication.

## 2022-10-07 NOTE — ED Notes (Signed)
Neurology at bedside.

## 2022-10-07 NOTE — ED Notes (Signed)
Called floor and let them know patient would be coming up as it has been over 40 mins

## 2022-10-07 NOTE — ED Notes (Signed)
Patient spontaneously opening eyes. Opens eyes to voice. Patient moaning, but not verbally communicating.

## 2022-10-07 NOTE — H&P (Signed)
History and Physical    Patient: Miguel Black KGM:[id number] DOB: 08/23/1990 DOA: 10/06/2022 DOS: the patient was seen and examined on 10/07/2022 PCP: Lula Olszewski, MD  Patient coming from: Home with mother via EMS  Chief Complaint:  Chief Complaint  Patient presents with   Seizures   HPI: Miguel Black is a 32 y.o. male with medical history significant of multiple sclerosis, seizure disorder, and gait disturbance who presents after having seizure at home yesterday evening.  History unable to be obtained from the patient due to him being acutely altered at this time.  It was reported that he had not been taking his seizure medicine. He just had informed his mother 1 week ago that he had not been taking any of his antiseizure medicines for some time.  Patient was reported to be actively seizing upon their arrival and had been given Versed 5 mg.  Patient had recurrent episode of seizure like activity while they were transporting him off the ambulance and was given additional 2.5 mg of Versed.  Review of records note patient last office visit with neurology was 08/08/22 at that time it was recommended that he continue on Briviact 100mg  BID despite reports of having suicidal ideations and zonisamide 200mg  at bedtime.  He reported that Keppra had made him feel irritable.  He didn't wanted to try any other medicines.  In the emergency department patient was noted to be afebrile with pulse 58-97, respirations 13-25, and all other vital signs relatively maintained.  Labs significant for WBC 16.5, potassium 5.3, AST 42, and ALT 24.  Any acute abnormality and unchanged periventricular white matter lesions worse on the left periatrial region.  Neurology has been consulted and recommended CTA of the head that did not show any acute abnormality.  Patient has been given Keppra 1.5 g IV and 1 L lactated Ringer's.  Long-term EEG monitoring was ordered.  After several hours patient was still noted to be  for which admission was requested for monitoring.   Review of Systems: unable to review all systems due to the inability of the patient to answer questions. Past Medical History:  Diagnosis Date   Acute encephalopathy 04/26/2022   Acute metabolic encephalopathy 04/26/2022   Acute respiratory failure with hypoxia (HCC) 08/23/2021   Aspiration pneumonia (HCC) 07/12/2018   Closed fracture of distal phalanx or phalanges of hand 02/05/2009   Formatting of this note might be different from the original. Overview: The original diagnosis was replaced automatically by the ICD-10 team to an ICD-10 compliant one. Please review for accuracy   Encounter for screening for HIV 07/12/2018   Foot drop 06/03/2022   Has a left foot drop ever since 2015 first flare of multiple sclerosis had a brace in 2022 custom but does not actually do anything to lift the from the foot physical therapist advised him to get an alternative foot drop brace but I cannot find record of that   Marijuana abuse 05/01/2013   MS (multiple sclerosis) (HCC)    Seizure (HCC)    Seizures (HCC) 01/22/2019   Status epilepticus (HCC) 07/12/2018   Past Surgical History:  Procedure Laterality Date   NO PAST SURGERIES     Social History:  reports that he has been smoking cigars. He has never used smokeless tobacco. He reports current alcohol use. He reports current drug use. Drug: Marijuana.  Allergies  Allergen Reactions   Dalfampridine Other (See Comments)    Seizure 02/2021 while on dalfampridine  Family History  Problem Relation Age of Onset   Arthritis Mother    Hypertension Mother    Healthy Father    Healthy Sister    Healthy Brother    Hodgkin's lymphoma Brother    Colon cancer Maternal Uncle 45   Kidney disease Maternal Grandmother    Hypertension Maternal Grandmother    Arthritis Maternal Grandmother    Diabetes Maternal Grandmother    Stroke Maternal Grandfather    Hypertension Maternal Grandfather    Diabetes  Paternal Grandmother     Prior to Admission medications   Medication Sig Start Date End Date Taking? Authorizing Provider  brivaracetam (BRIVIACT) 100 MG TABS tablet Take 1 tablet (100 mg total) by mouth every 12 (twelve) hours. 08/08/22   Lomax, Amy, NP  diazePAM (VALTOCO 5 MG DOSE) 5 MG/0.1ML LIQD Place 1 spray into the nose daily as needed. 10/03/22   Lula Olszewski, MD  OCREVUS 300 MG/10ML injection INFUSE 600MG  IN 0.9% NS INTRAVENOUSLY AT 40ML\HR AND INCREASE BY 40ML\HR EVERY 30 MINUTES (MAX 200ML\HR) OVER AT LEAST 3.5HRS AS DIRECTED EVERY 6 MONTHS. 06/07/21   Sater, Pearletha Furl, MD  zonisamide (ZONEGRAN) 100 MG capsule Take 2 capsules (200 mg total) by mouth daily. 08/08/22   Shawnie Dapper, NP    Physical Exam: Vitals:   10/07/22 0430 10/07/22 0500 10/07/22 0545 10/07/22 0715  BP: 105/60 93/69 102/75 107/73  Pulse: 63 60 (!) 58 60  Resp:  16 13 15   Temp:  98.2 F (36.8 C)    TempSrc:      SpO2: 100% 96% 98% 99%  Weight:      Height:       Constitutional: Young male currently in no acute distress Eyes: PERRL, lids and conjunctivae normal ENMT: Mucous membranes are moist.   Neck: normal, supple,   Respiratory: clear to auscultation bilaterally, no wheezing, no crackles. Normal respiratory effort. No accessory muscle use.  Cardiovascular: Regular rate and rhythm, no murmurs / rubs / gallops. No extremity edema. 2+ pedal pulses. No carotid bruits.  Abdomen: no tenderness, no masses palpated. No hepatosplenomegaly. Bowel sounds positive.  Musculoskeletal: no clubbing / cyanosis. No joint deformity upper and lower extremities. Good ROM, no contractures. Normal muscle tone.  Skin: no rashes, lesions, ulcers. No induration Neurologic: CN 2-12 grossly intact. Sensation intact, DTR normal. Strength 5/5 in all 4.  Psychiatric: Lethargic.  Alert and oriented to self not place or time.  Data Reviewed:  EKG reveals sinus rhythm 81 bpm without any significant ischemic changes appreciated.   Reviewed labs, imaging, and pertinent records as documented in this note.  Assessment and Plan:  Seizures Postictal Patient presented after having seizures and due to what was reported as noncompliance with taking his antiseizure medication due to reports of the way the medications they make patient feel.  CT scan of the head noted unchanged periventricular white matter lesions worse on the left periatrial regionchronic.  CT angiogram of the head and neck negative for any acute abnormality.  Patient had been loaded with 1500 mg of Keppra IV. -Admit to a medical telemetry bed -Seizure and aspiration precautions -Neurochecks -N.p.o. and advance diet once patient more alert and oriented to follow commands -Follow-up with long-term EEG monitoring -Continue Keppra 500 mg IV twice daily. -Normal saline IV fluids at 75 mL/h -Ativan IV as needed for seizure activity -Appreciate neurology consultative services, we will follow-up for any further recommendations  Relapsing remitting multiple sclerosis Gait disturbance Patient diagnosed with relapsing remitting MS  and June 2015.  At baseline he walks with a cane.  He is [facility] by [facility] neurology on Ocrevus 600 mg IV every 6 months.  Leukocytosis Acute.  WBC elevated 16.5.  No fevers noted while in the emergency department. -Check urinalysis and chest x-ray  Hypocalcemia Acute.  Calcium 8.3 with albumin noted to be within normal limits. -Give 2 g of calcium gluconate IV -Continue to monitor and replace as needed  Elevated AST Acute.  AST mildly elevated at 42. Possibly reactive in nature.    DVT prophylaxis: Lovenox Advance Care Planning:   Code Status: Full Code    Consults: Neurology  Family Communication: Patient's mother updated with the phone  Severity of Illness: The appropriate patient status for this patient is OBSERVATION. Observation status is judged to be reasonable and necessary in order to provide the required  intensity of service to ensure the patient's safety. The patient's presenting symptoms, physical exam findings, and initial radiographic and laboratory data in the context of their medical condition is felt to place them at decreased risk for further clinical deterioration. Furthermore, it is anticipated that the patient will be medically stable for discharge from the hospital within 2 midnights of admission.   Author: Clydie Braun, MD 10/07/2022 8:55 AM  For on call review [email or web address].

## 2022-10-07 NOTE — Progress Notes (Signed)
LTM EEG hooked up and running - no initial skin breakdown - push button tested - No [facility]. Patient in the ED

## 2022-10-07 NOTE — Discharge Instructions (Addendum)
You were seen today for a seizure.  Your labs or imaging did not demonstrate any underlying cause this is likely related to you not taking your medications.  It is very important that you take your antiepileptics to prevent seizures.  If you are concerned about your ability to take her medications I recommend that you follow-up with your neurologist to discuss other options.

## 2022-10-07 NOTE — Progress Notes (Signed)
Attempted to get information but pt confused. TOC following.

## 2022-10-07 NOTE — Consult Note (Signed)
NEUROLOGY CONSULTATION NOTE   Date of service: October 07, 2022 Patient Name: Miguel Black MRN:  [id number] DOB:  November 16, 1990 Reason for consult: "seizures, prolonged post ictal" Requesting Provider: Lorre Nick, MD _ _ _   _ __   _ __ _ _  __ __   _ __   __ _  History of Present Illness   Miguel Black is a 32 y.o. male with PMH significant for RRMS, epilepsy with medication noncompliance who presents with seizure at home, EMS called. He was given 5 mg of IM Versed.  He was brought into the ED where he had recurrent brief episode of jerking just prior to getting him off the ambulance and was given an additional 2.5 mg of Versed.  In the ED, he was loaded with Keppra 1500 mg.  No clinical seizure activity noted.  He has been observed in the ED for the last several hours and is still somnolent, he arouses, make eye contact, but is mute despite a lot of encouragement, I was able to get him to stick his tongue out and barely give me a thumbs up.  Neurology was consulted for concerns that this could be prolonged postictal period versus subclinical seizures.  Mother tells me that patient told her that last Friday patient told her that medications are making him suicidal thoughts and agitated and stopped taking his medications.   ROS   Unable to obtain detailed review of systems secondary to obtunded mentation.  Past History   Past Medical History:  Diagnosis Date   Acute encephalopathy 04/26/2022   Acute metabolic encephalopathy 04/26/2022   Acute respiratory failure with hypoxia (HCC) 08/23/2021   Aspiration pneumonia (HCC) 07/12/2018   Closed fracture of distal phalanx or phalanges of hand 02/05/2009   Formatting of this note might be different from the original. Overview: The original diagnosis was replaced automatically by the ICD-10 team to an ICD-10 compliant one. Please review for accuracy   Encounter for screening for HIV 07/12/2018   Foot drop 06/03/2022   Has a  left foot drop ever since 2015 first flare of multiple sclerosis had a brace in 2022 custom but does not actually do anything to lift the from the foot physical therapist advised him to get an alternative foot drop brace but I cannot find record of that   Marijuana abuse 05/01/2013   MS (multiple sclerosis) (HCC)    Seizure (HCC)    Seizures (HCC) 01/22/2019   Status epilepticus (HCC) 07/12/2018   Past Surgical History:  Procedure Laterality Date   NO PAST SURGERIES     Family History  Problem Relation Age of Onset   Arthritis Mother    Hypertension Mother    Healthy Father    Healthy Sister    Healthy Brother    Hodgkin's lymphoma Brother    Colon cancer Maternal Uncle 45   Kidney disease Maternal Grandmother    Hypertension Maternal Grandmother    Arthritis Maternal Grandmother    Diabetes Maternal Grandmother    Stroke Maternal Grandfather    Hypertension Maternal Grandfather    Diabetes Paternal Grandmother    Social History   Socioeconomic History   Marital status: Unknown    Spouse name: Not on file   Number of children: 0   Years of education: 11   Highest education level: Not on file  Occupational History   Occupation: Unemployed  Tobacco Use   Smoking status: Some Days    Types: Cigars  Smokeless tobacco: Never   Tobacco comments:    2.5-3 per day  Vaping Use   Vaping status: Never Used  Substance and Sexual Activity   Alcohol use: Yes    Comment: sometimes    Drug use: Yes    Types: Marijuana    Comment: Reports he smokes a lot, cannot put a number to it   Sexual activity: Not Currently  Other Topics Concern   Not on file  Social History Narrative   Left handed   Coffee every day   Lives with family    Social Determinants of Health   Financial Resource Strain: Not on file  Food Insecurity: Not on file  Transportation Needs: Not on file  Physical Activity: Not on file  Stress: Not on file  Social Connections: Unknown (10/13/2021)   Received  [facility]   Social Network    Social Network: Not on file   Allergies  Allergen Reactions   Dalfampridine Other (See Comments)    Seizure 02/2021 while on dalfampridine    Medications  (Not in a hospital admission)    Vitals   Vitals:   10/07/22 0215 10/07/22 0345 10/07/22 0430 10/07/22 0500  BP: 123/79 101/66 105/60 93/69  Pulse: 86 (!) 59 63 60  Resp: (!) 25 16  16   Temp:    98.2 F (36.8 C)  TempSrc:      SpO2: 96% 100% 100% 96%  Weight:      Height:         Body mass index is 19.99 kg/m.  Physical Exam   General: Laying comfortably in bed; in no acute distress.  HENT: Normal oropharynx and mucosa. Normal external appearance of ears and nose.  Neck: Supple, no pain or tenderness  CV: No JVD. No peripheral edema.  Pulmonary: Symmetric Chest rise. Normal respiratory effort.  Abdomen: Soft to touch, non-tender.  Ext: No cyanosis, edema, or deformity  Skin: No rash. Normal palpation of skin.   Musculoskeletal: Normal digits and nails by inspection. No clubbing.   Neurologic Examination  Mental status/Cognition: Opens eyes to vigorous tactile stimulation/noxious stimuli.  Makes brief eye contact and will briefly track me around the room. He does not answer any orientation questions.  He has very poor attention and requires constant stimulation to stay awake  speech/language: Mute, no speech.  With a lot of encouragement/coaxing I was able to get him to stick his tongue out and barely give me a thumbs up. Cranial nerves:   CN II Pupils equal and reactive to light, makes eye contact on left and right.   CN III,IV,VI No nystagmus, no gaze preference or deviation.  Makes eye contact on left and right.   CN V Corneals intact bilaterally   CN VII no asymmetry, no nasolabial fold flattening   CN VIII Makes eye contact to speech   CN IX & X Seems to be protecting his airway fine.   CN XI Head is midline.   CN XII midline tongue protrusion   Motor:   Muscle bulk: normal, tone normal Moves all extremities spontaneously and antigravity.  Is able to toss and turn in the bed.  Sensation:  Light touch    Pin prick Localizes to proximal pinch in all extremities.   Temperature    Vibration   Proprioception    Coordination/Complex Motor:  Unable to assess coordination but movements seem smooth and without any obvious ataxia.  Labs   CBC:  Recent Labs  Lab  10/06/22 2101  WBC 16.5*  NEUTROABS 14.1*  HGB 15.2  HCT 47.1  MCV 97.3  PLT 265    Basic Metabolic Panel:  Lab Results  Component Value Date   NA 135 10/06/2022   K 5.3 (H) 10/06/2022   CO2 18 (L) 10/06/2022   GLUCOSE 110 (H) 10/06/2022   BUN 12 10/06/2022   CREATININE 1.08 10/06/2022   CALCIUM 8.3 (L) 10/06/2022   GFRNONAA >60 10/06/2022   GFRAA 99 07/22/2019   Lipid Panel:  Lab Results  Component Value Date   LDLCALC 113 (H) 06/03/2022   HgbA1c:  Lab Results  Component Value Date   HGBA1C 5.5 08/22/2021   Urine Drug Screen:     Component Value Date/Time   LABOPIA NONE DETECTED 06/13/2022 1452   COCAINSCRNUR NONE DETECTED 06/13/2022 1452   LABBENZ POSITIVE (A) 06/13/2022 1452   AMPHETMU NONE DETECTED 06/13/2022 1452   THCU POSITIVE (A) 06/13/2022 1452   LABBARB NONE DETECTED 06/13/2022 1452    Alcohol Level     Component Value Date/Time   ETH <10 08/21/2021 1423    CT Head without contrast(Personally reviewed): CTH was negative for a large hypodensity concerning for a large territory infarct or hyperdensity concerning for an ICH  CT angio Head and Neck with contrast(Personally reviewed): No LVO  cEEG:  pending  Impression   Miguel Black is a 33 y.o. male with PMH significant for RRMS, epilepsy with medication noncompliance who presents with seizure at home, got 7.5mg  of Versed per EMS. Now appears post ictal and sedated. Got loaded with Keppra 1.5g and since then, has not had any clinical seizures.  Some concern for aphasia out of  proportion to encephalopathy noted on my exam today so will get CTA and get him up on LTM to ensure no epileptic aphasia.  I also spoke with his mother in detail over phone who reports that he really wants to take his seizure medications. He was taking it back in July when he was seen in neurology clinic. He was feeling good on briviact, B6 and Zonisamide. However, he has been feeling suicidal recently and attributing that to his seizure medications and that is why he stopped taking his medications.  Recommendations  - Keppra 1500mg  IV once for now. Continue Keppra 500mg  BID for now until able to tolerate PO. - LTM EEG. - would need to discuss with him in AM when he is more awake about alternative to his current antiseizure regimen. Considering Lamictal or Depakote might be a good option given their mood stabilizing properties. - will also need to screen for suicidality. Unable to clarify at this time with the noted encephalopathy. ______________________________________________________________________   Thank you for the opportunity to take part in the care of this patient. If you have any further questions, please contact the neurology consultation attending.  Signed,  Erick Blinks [facility] _ _ _   _ __   _ __ _ _  __ __   _ __   __ _

## 2022-10-07 NOTE — ED Notes (Signed)
EEG at bedside.

## 2022-10-07 NOTE — ED Provider Notes (Signed)
  Physical Exam  BP 102/75   Pulse (!) 58   Temp 98.2 F (36.8 C)   Resp 13   Ht 5\' 11"  (1.803 m)   Wt 65 kg   SpO2 98%   BMI 19.99 kg/m   Physical Exam  Procedures  Procedures  ED Course / MDM   Clinical Course as of 10/07/22 0714  Fri Oct 07, 2022  0448 Patient continues to be very somnolent, prolonged postictal state.  Will arouse to voice minimally but not communicating, moaning, withdraws from pain.  Does not follow directions.  Airway is intact.  Per neurology recommendation patient has been obvious in the ED for 8 hours, at this time given persistent postictal state will admit for prolonged postictal state and likely EEG in the inpatient setting.  Hospitalist consult pending. [RS]  [phone] Neurology at the bedside to reevaluate the patient, recommending CTAs at this time. Agrees with plan for admission. I appreciate his collaboration in the care of this patient.  [RS]  [phone] Consult to hospitalist, Dr. Lazarus Salines who states that the patient will be an AM carryover. I appreciate his collaboration in the care of this patient.  [RS]    Clinical Course User Index [RS] Jailynn Lavalais, Eugene Gavia, PA-C   Medical Decision Making Amount and/or Complexity of Data Reviewed Labs: ordered. Radiology: ordered.  Risk Prescription drug management. Decision regarding hospitalization.    Care of this patient assumed from preceding ED provider Dr. Holley Bouche, DO, ED resident.  Please see her associated note for further insight and the patient ED course.  In brief patient has a history of MS and seizure disorder with poor compliance with his antiepileptic medication.  Presents today after an atraumatic seizure witnessed by his mother.  She states this was consistent with his known seizure disorder.  Presents in postictal state.  Was administered 5 mg of Versed en route with concern for generalized seizure activity with EMS, subsequently received an additional 2.5 mg of Versed in the ED.  At time of  shift change patient has had a reassuring medical workup in the ED but remains postictal at this time.  Case was discussed with neurologist Dr. Keturah Barre, who recommends a total of 8 hours of observation in the emergency department and if patient remains postictal at that time will warrant admission to medicine service for inpatient EEG and further evaluation for prolonged postictal state.  Remains responsive only to painful stimuli at this time; airway is intact.  This chart was dictated using voice recognition software, Dragon. Despite the best efforts of this provider to proofread and correct errors, errors may still occur which can change documentation meaning.      Sherrilee Gilles 10/07/22 0715    Tilden Fossa, MD 10/07/22 [phone]

## 2022-10-07 NOTE — Plan of Care (Signed)
  Problem: Activity: Goal: Risk for activity intolerance will decrease Outcome: Progressing   Problem: Nutrition: Goal: Adequate nutrition will be maintained Outcome: Progressing   Problem: Coping: Goal: Level of anxiety will decrease Outcome: Progressing   Problem: Safety: Goal: Verbalization of understanding the information provided will improve Outcome: Progressing   Problem: Self-Concept: Goal: Level of anxiety will decrease Outcome: Progressing

## 2022-10-08 ENCOUNTER — Observation Stay (HOSPITAL_COMMUNITY): Payer: Medicare (Managed Care)

## 2022-10-08 DIAGNOSIS — M21372 Foot drop, left foot: Secondary | ICD-10-CM | POA: Diagnosis present

## 2022-10-08 DIAGNOSIS — D72829 Elevated white blood cell count, unspecified: Secondary | ICD-10-CM | POA: Diagnosis present

## 2022-10-08 DIAGNOSIS — G40919 Epilepsy, unspecified, intractable, without status epilepticus: Secondary | ICD-10-CM | POA: Diagnosis not present

## 2022-10-08 DIAGNOSIS — G35 Multiple sclerosis: Secondary | ICD-10-CM | POA: Diagnosis present

## 2022-10-08 DIAGNOSIS — R569 Unspecified convulsions: Secondary | ICD-10-CM | POA: Diagnosis present

## 2022-10-08 DIAGNOSIS — R7401 Elevation of levels of liver transaminase levels: Secondary | ICD-10-CM | POA: Diagnosis present

## 2022-10-08 DIAGNOSIS — Z833 Family history of diabetes mellitus: Secondary | ICD-10-CM | POA: Diagnosis not present

## 2022-10-08 DIAGNOSIS — Z8261 Family history of arthritis: Secondary | ICD-10-CM | POA: Diagnosis not present

## 2022-10-08 DIAGNOSIS — Z823 Family history of stroke: Secondary | ICD-10-CM | POA: Diagnosis not present

## 2022-10-08 DIAGNOSIS — T426X6A Underdosing of other antiepileptic and sedative-hypnotic drugs, initial encounter: Secondary | ICD-10-CM | POA: Diagnosis present

## 2022-10-08 DIAGNOSIS — Z807 Family history of other malignant neoplasms of lymphoid, hematopoietic and related tissues: Secondary | ICD-10-CM | POA: Diagnosis not present

## 2022-10-08 DIAGNOSIS — Z91199 Patient's noncompliance with other medical treatment and regimen due to unspecified reason: Secondary | ICD-10-CM | POA: Diagnosis not present

## 2022-10-08 DIAGNOSIS — E875 Hyperkalemia: Secondary | ICD-10-CM | POA: Diagnosis present

## 2022-10-08 DIAGNOSIS — G40909 Epilepsy, unspecified, not intractable, without status epilepticus: Secondary | ICD-10-CM | POA: Diagnosis present

## 2022-10-08 DIAGNOSIS — R45851 Suicidal ideations: Secondary | ICD-10-CM | POA: Diagnosis present

## 2022-10-08 DIAGNOSIS — Z800 Family history of malignant neoplasm of digestive organs: Secondary | ICD-10-CM | POA: Diagnosis not present

## 2022-10-08 DIAGNOSIS — Z888 Allergy status to other drugs, medicaments and biological substances status: Secondary | ICD-10-CM | POA: Diagnosis not present

## 2022-10-08 DIAGNOSIS — Z91128 Patient's intentional underdosing of medication regimen for other reason: Secondary | ICD-10-CM | POA: Diagnosis not present

## 2022-10-08 DIAGNOSIS — Z560 Unemployment, unspecified: Secondary | ICD-10-CM | POA: Diagnosis not present

## 2022-10-08 DIAGNOSIS — L89152 Pressure ulcer of sacral region, stage 2: Secondary | ICD-10-CM | POA: Diagnosis present

## 2022-10-08 DIAGNOSIS — Z8249 Family history of ischemic heart disease and other diseases of the circulatory system: Secondary | ICD-10-CM | POA: Diagnosis not present

## 2022-10-08 DIAGNOSIS — F17290 Nicotine dependence, other tobacco product, uncomplicated: Secondary | ICD-10-CM | POA: Diagnosis present

## 2022-10-08 LAB — VALPROIC ACID LEVEL: Valproic Acid Lvl: 80 ug/mL (ref 50.0–100.0)

## 2022-10-08 LAB — CBC
HCT: 39.7 % (ref 39.0–52.0)
Hemoglobin: 13.2 g/dL (ref 13.0–17.0)
MCH: 32.5 pg (ref 26.0–34.0)
MCHC: 33.2 g/dL (ref 30.0–36.0)
MCV: 97.8 fL (ref 80.0–100.0)
Platelets: 223 10*3/uL (ref 150–400)
RBC: 4.06 MIL/uL — ABNORMAL LOW (ref 4.22–5.81)
RDW: 13.6 % (ref 11.5–15.5)
WBC: 6.8 10*3/uL (ref 4.0–10.5)
nRBC: 0 % (ref 0.0–0.2)

## 2022-10-08 LAB — COMPREHENSIVE METABOLIC PANEL
ALT: 19 U/L (ref 0–44)
AST: 21 U/L (ref 15–41)
Albumin: 3.3 g/dL — ABNORMAL LOW (ref 3.5–5.0)
Alkaline Phosphatase: 45 U/L (ref 38–126)
Anion gap: 13 (ref 5–15)
BUN: 10 mg/dL (ref 6–20)
CO2: 23 mmol/L (ref 22–32)
Calcium: 8.3 mg/dL — ABNORMAL LOW (ref 8.9–10.3)
Chloride: 107 mmol/L (ref 98–111)
Creatinine, Ser: 1.27 mg/dL — ABNORMAL HIGH (ref 0.61–1.24)
GFR, Estimated: >60 mL/min (ref >60)
Glucose, Bld: 76 mg/dL (ref 70–99)
Potassium: 3.9 mmol/L (ref 3.5–5.1)
Sodium: 143 mmol/L (ref 135–145)
Total Bilirubin: 0.8 mg/dL (ref 0.3–1.2)
Total Protein: 5.8 g/dL — ABNORMAL LOW (ref 6.5–8.1)

## 2022-10-08 MED ORDER — VALPROATE SODIUM 100 MG/ML IV SOLN
1250.0000 mg | Freq: Once | INTRAVENOUS | Status: AC
  Administered 2022-10-08: 1250 mg via INTRAVENOUS
  Filled 2022-10-08: qty 12.5

## 2022-10-08 MED ORDER — DIVALPROEX SODIUM 250 MG PO DR TAB
500.0000 mg | DELAYED_RELEASE_TABLET | Freq: Two times a day (BID) | ORAL | Status: DC
  Administered 2022-10-08 – 2022-10-09 (×2): 500 mg via ORAL
  Filled 2022-10-08 (×2): qty 2

## 2022-10-08 NOTE — Procedures (Signed)
Patient Name: Miguel Black  MRN: [id number]  Epilepsy Attending: Charlsie Quest  Referring Physician/Provider: Erick Blinks, MD  Duration: 10/07/2022 4098 to 10/08/2022 1191  Patient history:  32 y.o. male with PMH significant for RRMS, epilepsy with medication noncompliance who presents with seizure at home, got 7.5mg  of Versed per EMS. Now appears post ictal and sedated. Got loaded with Keppra 1.5g and since then, has not had any clinical seizures. Concern for aphasia out of proportion to encephalopathy. EEG to evaluate for seizure   Level of alertness: Awake, asleep  AEDs during EEG study: LEV  Technical aspects: This EEG study was done with scalp electrodes positioned according to the 10-20 International system of electrode placement. Electrical activity was reviewed with band pass filter of 1-70Hz , sensitivity of 7 uV/mm, display speed of 64mm/sec with a 60Hz  notched filter applied as appropriate. EEG data were recorded continuously and digitally stored.  Video monitoring was available and reviewed as appropriate.  Description: The posterior dominant rhythm consists of 8-9 Hz activity of moderate voltage (25-35 uV) seen predominantly in posterior head regions, symmetric and reactive to eye opening and eye closing. Sleep was characterized by vertex waves, sleep spindles (12 to 14 Hz), maximal frontocentral region. EEG showed intermittent generalized polymorphic 3  to 5 Hz theta-delta slowing. Frequent sharp waves were noted in right fronto-temporal region. Hyperventilation and photic stimulation were not performed.     ABNORMALITY -Sharp wave, right fronto-temporal region  - Intermittent slow, generalized  IMPRESSION: This study showed evidence of epileptogenicity arising from  right fronto-temporal region. Additionally there is moderate diffuse encephalopathy. No seizures were seen throughout the recording.     Channing Yeager [name]

## 2022-10-08 NOTE — Progress Notes (Signed)
LTM EEG discontinued - no skin breakdown at [facility].  

## 2022-10-08 NOTE — Progress Notes (Addendum)
Neurology Progress Note   S:// Seen and examined.  Appears to be at baseline   O:// Current vital signs: BP 117/61 (BP Location: Left Arm)   Pulse 63   Temp 98.8 F (37.1 C) (Oral)   Resp 19   Ht 5\' 11"  (1.803 m)   Wt 65 kg   SpO2 100%   BMI 19.99 kg/m  Vital signs in last 24 hours: Temp:  [98 F (36.7 C)-99.1 F (37.3 C)] 98.8 F (37.1 C) (09/14 0846) Pulse Rate:  [60-63] 63 (09/14 0846) Resp:  [18-19] 19 (09/14 0846) BP: (105-117)/(57-78) 117/61 (09/14 0846) SpO2:  [100 %] 100 % (09/14 0846) General: Awake alert in no distress HEENT: Normocephalic atraumatic Lungs clear Cardiovascular: Regular rhythm Neurologic exam Awake alert oriented x 3 Speech is mildly dysarthric No evidence of aphasia Somewhat diminished attention concentration Cranial nerves II to XII grossly intact Motor examination with no drift but the left appears mildly weaker than right. Sensation intact to light touch Coordination examination with no dysmetria  Medications  Current Facility-Administered Medications:    0.9 %  sodium chloride infusion, , Intravenous, Continuous, Madelyn Flavors A, MD, Last Rate: 75 mL/hr at 10/07/22 1853, Infusion Verify at 10/07/22 1853   acetaminophen (TYLENOL) tablet 650 mg, 650 mg, Oral, Q6H PRN **OR** acetaminophen (TYLENOL) suppository 650 mg, 650 mg, Rectal, Q6H PRN, Katrinka Blazing, Rondell A, MD   albuterol (PROVENTIL) (2.5 MG/3ML) 0.083% nebulizer solution 2.5 mg, 2.5 mg, Nebulization, Q6H PRN, Katrinka Blazing, Rondell A, MD   enoxaparin (LOVENOX) injection 40 mg, 40 mg, Subcutaneous, Q24H, Smith, Rondell A, MD, 40 mg at 10/07/22 1535   levETIRAcetam (KEPPRA) IVPB 500 mg/100 mL premix, 500 mg, Intravenous, Q12H, Madelyn Flavors A, MD, Last Rate: 400 mL/hr at 10/08/22 0556, 500 mg at 10/08/22 0556   LORazepam (ATIVAN) injection 1-2 mg, 1-2 mg, Intravenous, Q2H PRN, Katrinka Blazing, Rondell A, MD   ondansetron (ZOFRAN) tablet 4 mg, 4 mg, Oral, Q6H PRN **OR** ondansetron (ZOFRAN) injection 4  mg, 4 mg, Intravenous, Q6H PRN, Katrinka Blazing, Rondell A, MD   sodium chloride flush (NS) 0.9 % injection 3 mL, 3 mL, Intravenous, Q12H, Smith, Rondell A, MD, 3 mL at 10/08/22 1012  Labs CBC    Component Value Date/Time   WBC 6.8 10/08/2022 0641   RBC 4.06 (L) 10/08/2022 0641   HGB 13.2 10/08/2022 0641   HGB 14.6 08/08/2022 0928   HCT 39.7 10/08/2022 0641   HCT 43.0 08/08/2022 0928   PLT 223 10/08/2022 0641   PLT 300 08/08/2022 0928   MCV 97.8 10/08/2022 0641   MCV 94 08/08/2022 0928   MCH 32.5 10/08/2022 0641   MCHC 33.2 10/08/2022 0641   RDW 13.6 10/08/2022 0641   RDW 12.9 08/08/2022 0928   LYMPHSABS 1.3 10/06/2022 2101   LYMPHSABS 1.5 08/08/2022 0928   MONOABS 0.8 10/06/2022 2101   EOSABS 0.0 10/06/2022 2101   EOSABS 0.1 08/08/2022 0928   BASOSABS 0.1 10/06/2022 2101   BASOSABS 0.0 08/08/2022 0928    CMP     Component Value Date/Time   NA 143 10/08/2022 0641   NA 140 07/22/2019 1502   K 3.9 10/08/2022 0641   CL 107 10/08/2022 0641   CO2 23 10/08/2022 0641   GLUCOSE 76 10/08/2022 0641   BUN 10 10/08/2022 0641   BUN 12 07/22/2019 1502   CREATININE 1.27 (H) 10/08/2022 0641   CREATININE 1.01 06/03/2022 1601   CALCIUM 8.3 (L) 10/08/2022 0641   PROT 5.8 (L) 10/08/2022 0641   PROT 7.2 07/22/2019  1502   ALBUMIN 3.3 (L) 10/08/2022 0641   ALBUMIN 4.9 07/22/2019 1502   AST 21 10/08/2022 0641   ALT 19 10/08/2022 0641   ALKPHOS 45 10/08/2022 0641   BILITOT 0.8 10/08/2022 0641   BILITOT <0.2 07/22/2019 1502   GFRNONAA >60 10/08/2022 0641   GFRAA 99 07/22/2019 1502    Lipid Panel     Component Value Date/Time   CHOL 199 06/03/2022 1601   TRIG 153 (H) 06/14/2022 0841   HDL 69 06/03/2022 1601   CHOLHDL 2.9 06/03/2022 1601   LDLCALC 113 (H) 06/03/2022 1601    Lab Results  Component Value Date   HGBA1C 5.5 08/22/2021    Overnight EEG: Epileptogenicity from the right frontotemporal region.  No seizures.  Moderate diffuse encephalopathy.  Imaging I have reviewed  images in epic and the results pertinent to this consultation are: CT head: No acute changes.  CT angio head and neck no emergent LVO.  Assessment: 32 year old past history of RRMS, epilepsy with medication noncompliance presents with seizure at home.  Got 7.5 mg of Versed per EMS.  Was postictal on scene by the neurologist overnight.  Got loaded with Keppra. No clinical seizures since then. Electrographically also has had no seizures overnight. He has had trouble with his antiepileptic medications-especially aggression and suicidality while on levetiracetam and brivaracetam. He was fine when he was on the brivaracetam, vitamin B6 and zonisamide at some point but then started feeling unwell on those. I discussed with him the option of an antiepileptic with mood stabilizing properties such as Depakote which he has agreed to try.   Impression: Breakthrough seizures-likely in the setting of noncompliance  Recommendations: Discontinue LTM EEG Does not feel Keppra or brivaracetam worked well for him-has side effects.  Discontinue brivaracetam or levetiracetam. Load with Depakote 20 mg/kg IV x 1 and start Depakote 500 mg twice daily. Will need to check his level after the load and again in the next week or so once steady-state is obtained. I would recommend follow-up with outpatient neurology in the next week  -- Milon Dikes, MD Neurologist [facility] Pager: [phone]

## 2022-10-08 NOTE — Progress Notes (Signed)
LTM EEG discontinued - no skin breakdown at [facility]. D/c by [facility], noted by MB

## 2022-10-08 NOTE — Progress Notes (Signed)
HOSPITALIST ROUNDING NOTE Miguel Black GUR:[id number]  DOB: August 06, 1990  DOA: 10/06/2022  PCP: Lula Olszewski, MD  10/08/2022,1:24 PM   LOS: 0 days      Code Status: Full   From: Home  current Dispo: Likely home with mother     32 year old h/o seizure disorder since 2018 Multiple sclerosis since 2015 --- follows with Dr. Epimenio Foot currently but has previously been seen by [facility] neurology in [facility] and then more recently until 2020 Dr.abou zeid--- he is supposed to be on Ocrevus Chronic THC  At baseline is maintained on Briviact 100 twice daily Zonegran 200 at bedtime but has not taken them and did not want to try other medications because he felt irritable on them/suicidal  9/13 brought to emergency room with seizure at home-was actively seizing was loaded with Versed at 5 and then 2.5 WBC was 16 LFTs were slightly off potassium was little elevated at 5.3 He was given Keppra 1.5 g IV and then 1 L of saline Long-term EEG was started  Neurology consulted  Plan   seizures with noncompliance and prior intolerance of Keppra Patient has been switched to a Depakote load which will be managed by neurology He is  okay for diet I had a detailed discussion with his mom at the bedside   Leukocytosis  UA is bland chest x-ray to be performed although WBC has improved-suspect no further workup with negative   hypocalcemia Loaded with Gluconate in ED     DVT prophylaxis: SCD  Status is: Observation The patient remains OBS appropriate and will d/c before 2 midnights.    Subjective: A little bit sleepy and slurring his words as he had just received the Depakote load He is interacting reasonably he has no pain At baseline he is able to walk short distances He stays at home  Objective + exam Vitals:   10/07/22 2332 10/08/22 0403 10/08/22 0846 10/08/22 1118  BP: 105/71  117/61 (!) 112/59  Pulse: 60  63 66  Resp:   19 16  Temp: 98.6 F (37 C) 98.1 F (36.7 C) 98.8 F (37.1 C)  99.3 F (37.4 C)  TempSrc: Oral Oral Oral   SpO2:   100% 100%  Weight:      Height:       Filed Weights   10/06/22 2055  Weight: 65 kg    Examination:  EOMI NCAT Chest clear S1-S2 no murmur ROM intact Slightly weak lower extremities but limited by effort   Data Reviewed: reviewed   CBC    Component Value Date/Time   WBC 6.8 10/08/2022 0641   RBC 4.06 (L) 10/08/2022 0641   HGB 13.2 10/08/2022 0641   HGB 14.6 08/08/2022 0928   HCT 39.7 10/08/2022 0641   HCT 43.0 08/08/2022 0928   PLT 223 10/08/2022 0641   PLT 300 08/08/2022 0928   MCV 97.8 10/08/2022 0641   MCV 94 08/08/2022 0928   MCH 32.5 10/08/2022 0641   MCHC 33.2 10/08/2022 0641   RDW 13.6 10/08/2022 0641   RDW 12.9 08/08/2022 0928   LYMPHSABS 1.3 10/06/2022 2101   LYMPHSABS 1.5 08/08/2022 0928   MONOABS 0.8 10/06/2022 2101   EOSABS 0.0 10/06/2022 2101   EOSABS 0.1 08/08/2022 0928   BASOSABS 0.1 10/06/2022 2101   BASOSABS 0.0 08/08/2022 0928      Latest Ref Rng & Units 10/08/2022    6:41 AM 10/06/2022    9:01 PM 06/16/2022    4:51 AM  CMP  Glucose  70 - 99 mg/dL 76  244  010   BUN 6 - 20 mg/dL 10  12  9    Creatinine 0.61 - 1.24 mg/dL 2.72  5.36  6.44   Sodium 135 - 145 mmol/L 143  135  142   Potassium 3.5 - 5.1 mmol/L 3.9  5.3  3.9   Chloride 98 - 111 mmol/L 107  104  109   CO2 22 - 32 mmol/L 23  18  22    Calcium 8.9 - 10.3 mg/dL 8.3  8.3  8.7   Total Protein 6.5 - 8.1 g/dL 5.8  6.7    Total Bilirubin 0.3 - 1.2 mg/dL 0.8  1.0    Alkaline Phos 38 - 126 U/L 45  57    AST 15 - 41 U/L 21  42    ALT 0 - 44 U/L 19  24       Scheduled Meds:  divalproex  500 mg Oral Q12H   enoxaparin (LOVENOX) injection  40 mg Subcutaneous Q24H   sodium chloride flush  3 mL Intravenous Q12H   Continuous Infusions:  sodium chloride 75 mL/hr at 10/07/22 1853   valproate sodium      Time  45  Rhetta Mura, MD  [facility]

## 2022-10-08 NOTE — Procedures (Signed)
Patient Name: Miguel Black  MRN: [id number]  Epilepsy Attending: Charlsie Quest  Referring Physician/Provider: Erick Blinks, MD  Duration: 10/08/2022 0853 to 10/08/2022 1226   Patient history:  32 y.o. male with PMH significant for RRMS, epilepsy with medication noncompliance who presents with seizure at home, got 7.5mg  of Versed per EMS. Now appears post ictal and sedated. Got loaded with Keppra 1.5g and since then, has not had any clinical seizures. Concern for aphasia out of proportion to encephalopathy. EEG to evaluate for seizure    Level of alertness: Awake, asleep   AEDs during EEG study: LEV   Technical aspects: This EEG study was done with scalp electrodes positioned according to the 10-20 International system of electrode placement. Electrical activity was reviewed with band pass filter of 1-70Hz , sensitivity of 7 uV/mm, display speed of 20mm/sec with a 60Hz  notched filter applied as appropriate. EEG data were recorded continuously and digitally stored.  Video monitoring was available and reviewed as appropriate.   Description: The posterior dominant rhythm consists of 8-9 Hz activity of moderate voltage (25-35 uV) seen predominantly in posterior head regions, symmetric and reactive to eye opening and eye closing. Sleep was characterized by vertex waves, sleep spindles (12 to 14 Hz), maximal frontocentral region. EEG showed intermittent generalized polymorphic 3  to 5 Hz theta-delta slowing. Frequent sharp waves were noted in right fronto-temporal region. Hyperventilation and photic stimulation were not performed.      ABNORMALITY -Sharp wave, right fronto-temporal region  - Intermittent slow, generalized   IMPRESSION: This study showed evidence of epileptogenicity arising from  right fronto-temporal region. Additionally there is moderate diffuse encephalopathy. No seizures were seen throughout the recording.  Kimia Finan [name]

## 2022-10-08 NOTE — Plan of Care (Signed)

## 2022-10-09 DIAGNOSIS — G40919 Epilepsy, unspecified, intractable, without status epilepticus: Secondary | ICD-10-CM | POA: Diagnosis not present

## 2022-10-09 LAB — BASIC METABOLIC PANEL
Anion gap: 7 (ref 5–15)
BUN: 9 mg/dL (ref 6–20)
CO2: 23 mmol/L (ref 22–32)
Calcium: 8.2 mg/dL — ABNORMAL LOW (ref 8.9–10.3)
Chloride: 109 mmol/L (ref 98–111)
Creatinine, Ser: 1.03 mg/dL (ref 0.61–1.24)
GFR, Estimated: >60 mL/min (ref >60)
Glucose, Bld: 90 mg/dL (ref 70–99)
Potassium: 3.6 mmol/L (ref 3.5–5.1)
Sodium: 139 mmol/L (ref 135–145)

## 2022-10-09 MED ORDER — DIVALPROEX SODIUM 500 MG PO DR TAB: 500.0000 mg | DELAYED_RELEASE_TABLET | Freq: Two times a day (BID) | ORAL | 3 refills | Status: DC

## 2022-10-09 NOTE — Progress Notes (Signed)
Neurology Progress Note   S:// Seen and examined.  Appears to be at baseline Tolerated Depakote well.  Reports no side effects.  Depakote level 80 after load.  O:// Current vital signs: BP 103/65 (BP Location: Right Arm)   Pulse (!) 59   Temp 98.6 F (37 C) (Oral)   Resp 16   Ht 5\' 11"  (1.803 m)   Wt 65 kg   SpO2 98%   BMI 19.99 kg/m  Vital signs in last 24 hours: Temp:  [98.1 F (36.7 C)-99.7 F (37.6 C)] 98.6 F (37 C) (09/15 0744) Pulse Rate:  [56-66] 59 (09/15 0744) Resp:  [16-18] 16 (09/15 0744) BP: (103-118)/(59-76) 103/65 (09/15 0744) SpO2:  [97 %-100 %] 98 % (09/15 0744) General: Awake alert in no distress HEENT: Normocephalic atraumatic Lungs clear Cardiovascular: Regular rhythm Neurologic exam Awake alert oriented x 3 Speech is mildly dysarthric No evidence of aphasia Somewhat diminished attention concentration Cranial nerves II to XII grossly intact Motor examination with no drift but the left appears mildly weaker than right. Sensation intact to light touch Coordination examination with no dysmetria  Medications  Current Facility-Administered Medications:    acetaminophen (TYLENOL) tablet 650 mg, 650 mg, Oral, Q6H PRN **OR** acetaminophen (TYLENOL) suppository 650 mg, 650 mg, Rectal, Q6H PRN, Katrinka Blazing, Rondell A, MD   albuterol (PROVENTIL) (2.5 MG/3ML) 0.083% nebulizer solution 2.5 mg, 2.5 mg, Nebulization, Q6H PRN, Katrinka Blazing, Rondell A, MD   divalproex (DEPAKOTE) DR tablet 500 mg, 500 mg, Oral, Q12H, Milon Dikes, MD, 500 mg at 10/09/22 1033   enoxaparin (LOVENOX) injection 40 mg, 40 mg, Subcutaneous, Q24H, Smith, Rondell A, MD, 40 mg at 10/08/22 1852   LORazepam (ATIVAN) injection 1-2 mg, 1-2 mg, Intravenous, Q2H PRN, Katrinka Blazing, Rondell A, MD   ondansetron (ZOFRAN) tablet 4 mg, 4 mg, Oral, Q6H PRN **OR** ondansetron (ZOFRAN) injection 4 mg, 4 mg, Intravenous, Q6H PRN, Katrinka Blazing, Rondell A, MD   sodium chloride flush (NS) 0.9 % injection 3 mL, 3 mL, Intravenous,  Q12H, Smith, Rondell A, MD, 3 mL at 10/09/22 1033  Labs CBC    Component Value Date/Time   WBC 6.8 10/08/2022 0641   RBC 4.06 (L) 10/08/2022 0641   HGB 13.2 10/08/2022 0641   HGB 14.6 08/08/2022 0928   HCT 39.7 10/08/2022 0641   HCT 43.0 08/08/2022 0928   PLT 223 10/08/2022 0641   PLT 300 08/08/2022 0928   MCV 97.8 10/08/2022 0641   MCV 94 08/08/2022 0928   MCH 32.5 10/08/2022 0641   MCHC 33.2 10/08/2022 0641   RDW 13.6 10/08/2022 0641   RDW 12.9 08/08/2022 0928   LYMPHSABS 1.3 10/06/2022 2101   LYMPHSABS 1.5 08/08/2022 0928   MONOABS 0.8 10/06/2022 2101   EOSABS 0.0 10/06/2022 2101   EOSABS 0.1 08/08/2022 0928   BASOSABS 0.1 10/06/2022 2101   BASOSABS 0.0 08/08/2022 0928    CMP     Component Value Date/Time   NA 139 10/09/2022 0612   NA 140 07/22/2019 1502   K 3.6 10/09/2022 0612   CL 109 10/09/2022 0612   CO2 23 10/09/2022 0612   GLUCOSE 90 10/09/2022 0612   BUN 9 10/09/2022 0612   BUN 12 07/22/2019 1502   CREATININE 1.03 10/09/2022 0612   CREATININE 1.01 06/03/2022 1601   CALCIUM 8.2 (L) 10/09/2022 0612   PROT 5.8 (L) 10/08/2022 0641   PROT 7.2 07/22/2019 1502   ALBUMIN 3.3 (L) 10/08/2022 0641   ALBUMIN 4.9 07/22/2019 1502   AST 21 10/08/2022 0641  ALT 19 10/08/2022 0641   ALKPHOS 45 10/08/2022 0641   BILITOT 0.8 10/08/2022 0641   BILITOT <0.2 07/22/2019 1502   GFRNONAA >60 10/09/2022 0612   GFRAA 99 07/22/2019 1502    Lipid Panel     Component Value Date/Time   CHOL 199 06/03/2022 1601   TRIG 153 (H) 06/14/2022 0841   HDL 69 06/03/2022 1601   CHOLHDL 2.9 06/03/2022 1601   LDLCALC 113 (H) 06/03/2022 1601    Lab Results  Component Value Date   HGBA1C 5.5 08/22/2021    Overnight EEG: Epileptogenicity from the right frontotemporal region.  No seizures.  Moderate diffuse encephalopathy.  Imaging I have reviewed images in epic and the results pertinent to this consultation are: CT head: No acute changes.  CT angio head and neck no emergent  LVO.  Assessment: 32 year old past history of RRMS, epilepsy with medication noncompliance presents with seizure at home.  Got 7.5 mg of Versed per EMS.  Was postictal on scene by the neurologist overnight.  Got loaded with Keppra. No clinical seizures since then. Electrographically also has had no seizures overnight. He has had trouble with his antiepileptic medications-especially aggression and suicidality while on levetiracetam and brivaracetam. He was fine when he was on the brivaracetam, vitamin B6 and zonisamide at some point but then started feeling unwell on those. I discussed with him the option of an antiepileptic with mood stabilizing properties such as Depakote which he has agreed to try.  Loaded with Depakote yesterday.  Level therapeutic after load.  Tolerated medication well.   Impression: Breakthrough seizures-likely in the setting of noncompliance  Recommendations: Continue Depakote 500 twice daily Seizure precautions Outpatient neurology follow-up in the next 1 to 2 weeks Plan discussed with Dr. Mahala Menghini. -- Milon Dikes, MD Neurologist [facility] Pager: [phone]

## 2022-10-09 NOTE — Plan of Care (Signed)
  Problem: Education: Goal: Knowledge of General Education information will improve Description Including pain rating scale, medication(s)/side effects and non-pharmacologic comfort measures Outcome: Progressing   Problem: Health Behavior/Discharge Planning: Goal: Ability to manage health-related needs will improve Outcome: Progressing   

## 2022-10-09 NOTE — Discharge Summary (Signed)
Physician Discharge Summary  Miguel Black:[id number] DOB: 05-17-1990 DOA: 10/06/2022  PCP: Lula Olszewski, MD  Admit date: 10/06/2022 Discharge date: 10/09/2022  Time spent: 33 minutes  Recommendations for Outpatient Follow-up:  Changed to Depakote bid--needs Depakote level in ~ 1 week or so Get renal/liver panel in 2 weeks  Discharge Diagnoses:  MAIN problem for hospitalization   Multiple seizures in the setting of noncompliance Decubitus ulcer stage II on admission Hypocalcemia on admission Leukocytosis admission secondary to seizure not infectious  Please see below for itemized issues addressed in HOpsital- refer to other progress notes for clarity if needed  Discharge Condition: Improved  Diet recommendation: Regular  Filed Weights   10/06/22 2055  Weight: 65 kg    History of present illness:  32 year old h/o seizure disorder since 2018 Multiple sclerosis since 2015 --- follows with Dr. Epimenio Foot currently but has previously been seen by [facility] neurology in [facility] and then more recently until 2020 Dr.abou zeid--- he is supposed to be on Ocrevus Chronic THC   At baseline is maintained on Briviact 100 twice daily Zonegran 200 at bedtime but has not taken them and did not want to try other medications because he felt irritable on them/suicidal   9/13 brought to emergency room with seizure at home-was actively seizing was loaded with Versed at 5 and then 2.5 WBC was 16 LFTs were slightly off potassium was little elevated at 5.3 He was given Keppra 1.5 g IV and then 1 L of saline Long-term EEG was started and this turned out to show epileptiform Trulicity in the right frontotemporal region but there were no seizures Neurology had a long talk with the family and the patient and felt that he would be better off on a mood stabilizer with antiseizure benefits such as Depakote-Depakote load was performed Depakote level post load was 80 patient was kept on 500 twice  daily of Depakote and discharged in a stable state He is stable for discharge  I tried to call his mom but did not reach her    Discharge Exam: Vitals:   10/09/22 0404 10/09/22 0744  BP:  103/65  Pulse: (!) 56 (!) 59  Resp:  16  Temp: 98.1 F (36.7 C) 98.6 F (37 C)  SpO2: 98% 98%    Subj on day of d/c   Awake coherent no distress  General Exam on discharge  EOMI NCAT no focal deficit although a little bit slow speech Able to straight leg raise S1-S2 no murmur Full neuroexam deferred  Discharge Instructions   Discharge Instructions     Diet - low sodium heart healthy   Complete by: As directed    Discharge instructions   Complete by: As directed    Take the Depakote twice daily and follow-up with Dr. Epimenio Foot in the outpatient setting for all of your neurological needs for the time being until you can decide on what next needs to be done Get labs as per Dr. Epimenio Foot as Depakote needs to be monitored a little bit more aggressively   Increase activity slowly   Complete by: As directed    No wound care   Complete by: As directed       Allergies as of 10/09/2022       Reactions   Dalfampridine Other (See Comments)   Seizure 02/2021 while on dalfampridine        Medication List     STOP taking these medications    brivaracetam 100 MG Tabs  tablet Commonly known as: BRIVIACT   zonisamide 100 MG capsule Commonly known as: ZONEGRAN       TAKE these medications    divalproex 500 MG DR tablet Commonly known as: DEPAKOTE Take 1 tablet (500 mg total) by mouth every 12 (twelve) hours.   Ocrevus 300 MG/10ML injection Generic drug: ocrelizumab INFUSE 600MG  IN 0.9% NS INTRAVENOUSLY AT 40ML\HR AND INCREASE BY 40ML\HR EVERY 30 MINUTES (MAX 200ML\HR) OVER AT LEAST 3.5HRS AS DIRECTED EVERY 6 MONTHS.   Valtoco 5 MG Dose 5 MG/0.1ML Liqd Generic drug: diazePAM Place 1 spray into the nose daily as needed.       Allergies  Allergen Reactions    Dalfampridine Other (See Comments)    Seizure 02/2021 while on dalfampridine    Follow-up Information     Lula Olszewski, MD.   Specialty: Internal Medicine Contact information: [address] [place] [address] [phone]                  The results of significant diagnostics from this hospitalization (including imaging, microbiology, ancillary and laboratory) are listed below for reference.    Significant Diagnostic Studies: DG CHEST PORT 1 VIEW  Result Date: 10/08/2022 CLINICAL DATA:  [id number] PNA (pneumonia) [id number] EXAM: PORTABLE CHEST - 1 VIEW COMPARISON:  06/13/2022 FINDINGS: Cardiac silhouette is unremarkable. No pneumothorax or pleural effusion. The lungs are clear. The visualized skeletal structures are unremarkable. IMPRESSION: No acute cardiopulmonary process. Electronically Signed   By: Layla Maw M.D.   On: 10/08/2022 14:15   Overnight EEG with video  Result Date: 10/08/2022 Charlsie Quest, MD     10/08/2022  8:56 AM Patient Name: Miguel Black MRN: [id number] Epilepsy Attending: Charlsie Quest Referring Physician/Provider: Erick Blinks, MD Duration: 10/07/2022 2706 to 10/08/2022 2376 Patient history:  32 y.o. male with PMH significant for RRMS, epilepsy with medication noncompliance who presents with seizure at home, got 7.5mg  of Versed per EMS. Now appears post ictal and sedated. Got loaded with Keppra 1.5g and since then, has not had any clinical seizures. Concern for aphasia out of proportion to encephalopathy. EEG to evaluate for seizure Level of alertness: Awake, asleep AEDs during EEG study: LEV Technical aspects: This EEG study was done with scalp electrodes positioned according to the 10-20 International system of electrode placement. Electrical activity was reviewed with band pass filter of 1-70Hz , sensitivity of 7 uV/mm, display speed of 59mm/sec with a 60Hz  notched filter applied as appropriate. EEG data were recorded continuously  and digitally stored.  Video monitoring was available and reviewed as appropriate. Description: The posterior dominant rhythm consists of 8-9 Hz activity of moderate voltage (25-35 uV) seen predominantly in posterior head regions, symmetric and reactive to eye opening and eye closing. Sleep was characterized by vertex waves, sleep spindles (12 to 14 Hz), maximal frontocentral region. EEG showed intermittent generalized polymorphic 3  to 5 Hz theta-delta slowing. Frequent sharp waves were noted in right fronto-temporal region. Hyperventilation and photic stimulation were not performed.   ABNORMALITY -Sharp wave, right fronto-temporal region - Intermittent slow, generalized IMPRESSION: This study showed evidence of epileptogenicity arising from  right fronto-temporal region. Additionally there is moderate diffuse encephalopathy. No seizures were seen throughout the recording. Priyanka [name]   CT ANGIO HEAD NECK W WO CM  Result Date: 10/07/2022 CLINICAL DATA:  Acute stroke suspected EXAM: CT ANGIOGRAPHY HEAD AND NECK WITH AND WITHOUT CONTRAST TECHNIQUE: Multidetector CT imaging of the head and neck was performed using the standard  protocol during bolus administration of intravenous contrast. Multiplanar CT image reconstructions and MIPs were obtained to evaluate the vascular anatomy. Carotid stenosis measurements (when applicable) are obtained utilizing NASCET criteria, using the distal internal carotid diameter as the denominator. RADIATION DOSE REDUCTION: This exam was performed according to the departmental dose-optimization program which includes automated exposure control, adjustment of the mA and/or kV according to patient size and/or use of iterative reconstruction technique. CONTRAST:  75mL OMNIPAQUE IOHEXOL 350 MG/ML SOLN COMPARISON:  Head CT from yesterday FINDINGS: CT HEAD FINDINGS Brain: No evidence of acute infarction, hemorrhage, hydrocephalus, extra-axial collection or mass lesion/mass effect. Low  densities in the cerebral white matter from multiple sclerosis by prior imaging. These are underestimated relative to brain MRI earlier this year. Vascular: No hyperdense vessel or unexpected calcification. Skull: Normal. Negative for fracture or focal lesion. Sinuses/Orbits: No acute finding. Review of the MIP images confirms the above findings CTA NECK FINDINGS Aortic arch: Normal with 3 vessel branching Right carotid system: Mild right ICA indentation due to stylohyoid ligament ossification. No stenosis, beading, or ulceration Left carotid system: Vessels are smoothly contoured and widely patent. No atheromatous changes Vertebral arteries: No proximal subclavian or vertebral stenosis. No atheromatous changes. Skeleton: Unremarkable Other neck: Unremarkable Upper chest: Clear apical lungs Review of the MIP images confirms the above findings CTA HEAD FINDINGS Anterior circulation: No significant stenosis, proximal occlusion, aneurysm, or vascular malformation. Posterior circulation: No significant stenosis, proximal occlusion, aneurysm, or vascular malformation. Venous sinuses: As permitted by contrast timing, patent. Review of the MIP images confirms the above findings IMPRESSION: Negative CTA.  No explanation for symptoms. Electronically Signed   By: Tiburcio Pea M.D.   On: 10/07/2022 05:36   CT Head Wo Contrast  Result Date: 10/06/2022 CLINICAL DATA:  Altered mental status EXAM: CT HEAD WITHOUT CONTRAST TECHNIQUE: Contiguous axial images were obtained from the base of the skull through the vertex without intravenous contrast. RADIATION DOSE REDUCTION: This exam was performed according to the departmental dose-optimization program which includes automated exposure control, adjustment of the mA and/or kV according to patient size and/or use of iterative reconstruction technique. COMPARISON:  06/13/2022 FINDINGS: Brain: No evidence of acute infarction, hemorrhage, hydrocephalus, extra-axial collection or  mass lesion/mass effect. Unchanged periventricular white matter hypoattenuation, worst in the left periatrial region. Vascular: No hyperdense vessel or unexpected calcification. Skull: Normal. Negative for fracture or focal lesion. Sinuses/Orbits: No acute finding. Other: None. IMPRESSION: 1. No acute intracranial abnormality. 2. Unchanged periventricular white matter hypoattenuation, worst in the left periatrial region. This is in keeping with the reported diagnosis of multiple sclerosis. Electronically Signed   By: Deatra Robinson M.D.   On: 10/06/2022 23:55    Microbiology: No results found for this or any previous visit (from the past 240 hour(s)).   Labs: Basic Metabolic Panel: Recent Labs  Lab 10/06/22 2101 10/08/22 0641 10/09/22 0612  NA 135 143 139  K 5.3* 3.9 3.6  CL 104 107 109  CO2 18* 23 23  GLUCOSE 110* 76 90  BUN 12 10 9   CREATININE 1.08 1.27* 1.03  CALCIUM 8.3* 8.3* 8.2*   Liver Function Tests: Recent Labs  Lab 10/06/22 2101 10/08/22 0641  AST 42* 21  ALT 24 19  ALKPHOS 57 45  BILITOT 1.0 0.8  PROT 6.7 5.8*  ALBUMIN 4.1 3.3*   No results for input(s): "LIPASE", "AMYLASE" in the last 168 hours. No results for input(s): "AMMONIA" in the last 168 hours. CBC: Recent Labs  Lab 10/06/22 2101 10/08/22  0641  WBC 16.5* 6.8  NEUTROABS 14.1*  --   HGB 15.2 13.2  HCT 47.1 39.7  MCV 97.3 97.8  PLT 265 223   Cardiac Enzymes: Recent Labs  Lab 10/07/22 1246  CKTOTAL 510*   BNP: BNP (last 3 results) No results for input(s): "BNP" in the last 8760 hours.  ProBNP (last 3 results) No results for input(s): "PROBNP" in the last 8760 hours.  CBG: Recent Labs  Lab 10/06/22 2102  GLUCAP 118*       Signed:  Rhetta Mura MD   [facility] 10/09/2022, 10:53 AM

## 2022-10-10 ENCOUNTER — Telehealth: Payer: Self-pay

## 2022-10-10 NOTE — Transitions of Care (Post Inpatient/ED Visit) (Signed)
10/10/2022  Name: Miguel Black MRN: [id number] DOB: [birth year]  Today's TOC FU Call Status: Today's TOC FU Call Status:: Successful TOC FU Call Completed TOC FU Call Complete Date: 10/10/22 Patient's Name and Date of Birth confirmed.  Transition Care Management Follow-up Telephone Call Date of Discharge: 10/09/22 Discharge Facility: [facility]) Type of Discharge: Inpatient Admission Primary Inpatient Discharge Diagnosis:: convulsions How have you been since you were released from the hospital?: Better Any questions or concerns?: No  Items Reviewed: Did you receive and understand the discharge instructions provided?: Yes Medications obtained,verified, and reconciled?: Yes (Medications Reviewed) Any new allergies since your discharge?: No Dietary orders reviewed?: Yes Do you have support at home?: Yes People in Home: significant other  Medications Reviewed Today: Medications Reviewed Today     Reviewed by Karena Addison, LPN (Licensed Practical Nurse) on 10/10/22 at 1147  Med List Status: <None>   Medication Order Taking? Sig Documenting Provider Last Dose Status Informant  diazePAM (VALTOCO 5 MG DOSE) 5 MG/0.1ML LIQD [id number] No Place 1 spray into the nose daily as needed. Lula Olszewski, MD Unknown Active Mother, Pharmacy Records  divalproex (DEPAKOTE) 500 MG DR tablet [id number]  Take 1 tablet (500 mg total) by mouth every 12 (twelve) hours. Rhetta Mura, MD  Active   OCREVUS 300 MG/10ML injection [id number] No INFUSE 600MG  IN 0.9% NS INTRAVENOUSLY AT 40ML\HR AND INCREASE BY 40ML\HR EVERY 30 MINUTES (MAX 200ML\HR) OVER AT LEAST 3.5HRS AS DIRECTED EVERY 6 MONTHS. Asa Lente, MD Unknown Active Mother, Pharmacy Records           Med Note [place], [place] L   Mon Jun 13, 2022 10:23 PM) Just had infusion a couple weeks ago.            Home Care and Equipment/Supplies: Were Home Health Services Ordered?: NA Any new equipment or medical supplies  ordered?: NA  Functional Questionnaire: Do you need assistance with bathing/showering or dressing?: No Do you need assistance with meal preparation?: No Do you need assistance with eating?: No Do you have difficulty maintaining continence: No Do you need assistance with getting out of bed/getting out of a chair/moving?: No Do you have difficulty managing or taking your medications?: No  Follow up appointments reviewed: PCP Follow-up appointment confirmed?: Yes Date of PCP follow-up appointment?: 10/18/22 Follow-up Provider: [facility] Follow-up appointment confirmed?: NA Do you need transportation to your follow-up appointment?: No Do you understand care options if your condition(s) worsen?: Yes-patient verbalized understanding    SIGNATURE Karena Addison, LPN [facility] Nurse Health Advisor Direct Dial [phone]

## 2022-10-18 ENCOUNTER — Ambulatory Visit: Payer: Medicare (Managed Care) | Admitting: Internal Medicine

## 2022-10-18 ENCOUNTER — Other Ambulatory Visit: Payer: Self-pay | Admitting: Neurology

## 2022-10-18 ENCOUNTER — Telehealth: Payer: Self-pay | Admitting: Internal Medicine

## 2022-10-18 DIAGNOSIS — G35 Multiple sclerosis: Secondary | ICD-10-CM

## 2022-10-18 NOTE — Telephone Encounter (Signed)
LVM informing pt of no show status from today's visit. I offered pt to call back for reschedule.

## 2022-10-19 IMAGING — CT CT HEAD W/O CM
3 series · 14 of 47 positions shown, 16 images · non-contrast
Comparison: 12/03/2019.  07/12/2018.

CLINICAL DATA: No status changes.



[Series 3: head without · axial · non-contrast · 0.49mm/px · z∈[+1239,+1384]mm · 8 of 35 slices shown, 10 images]
[im 3/35  brain]
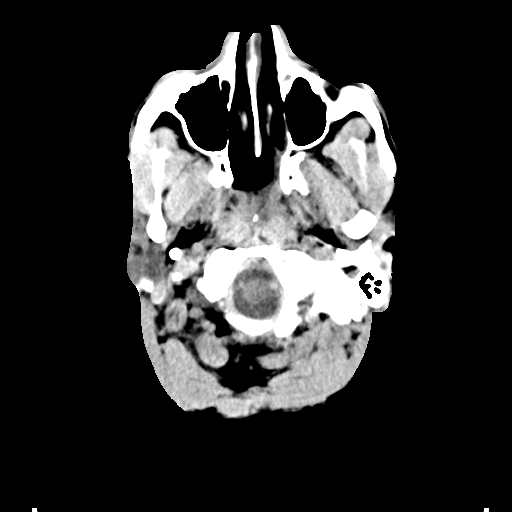
[im 3/35  bone]
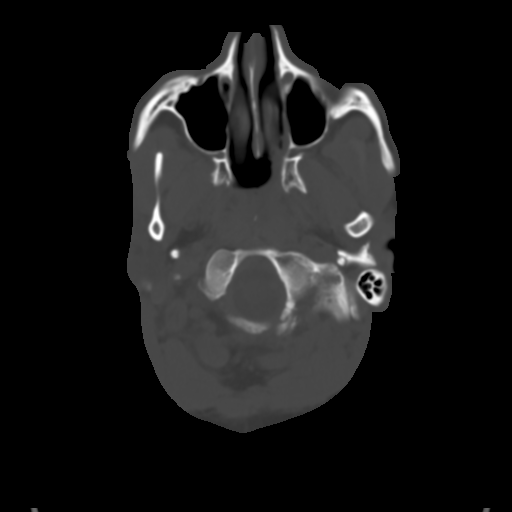
[im 8/35  brain]
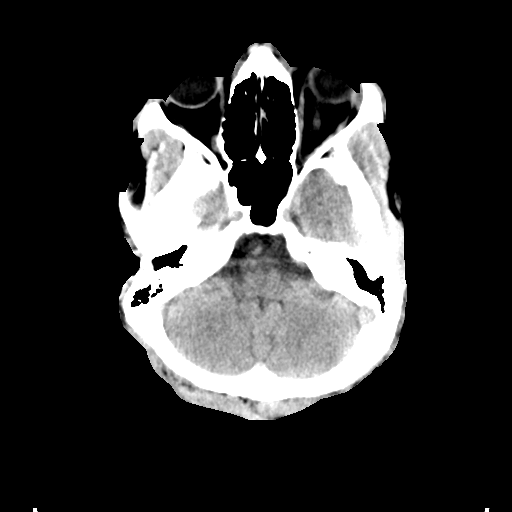
[im 11/35  brain]
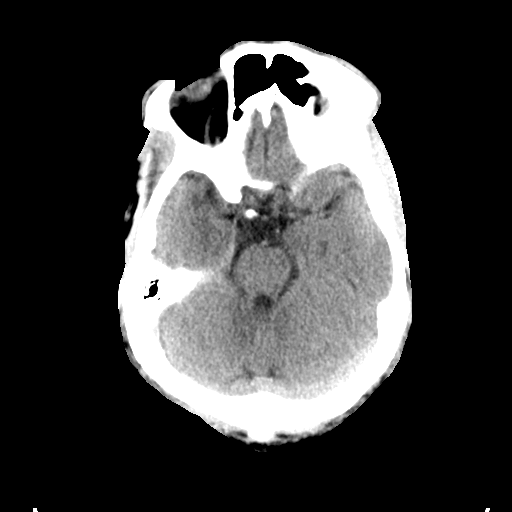
[im 16/35  brain]
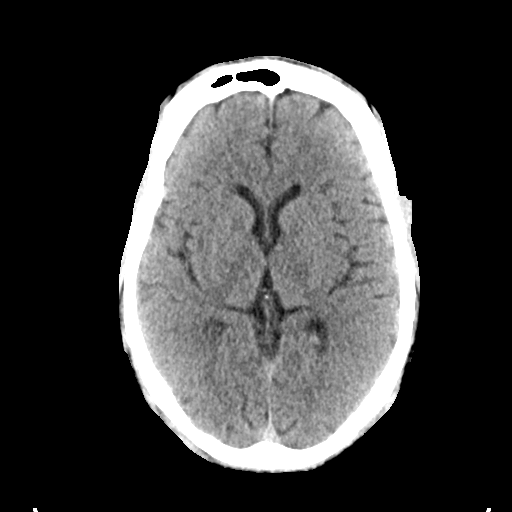
[im 19/35  brain]
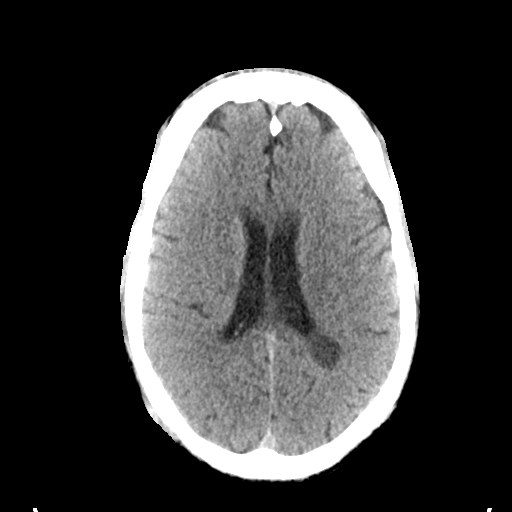
[im 19/35  bone]
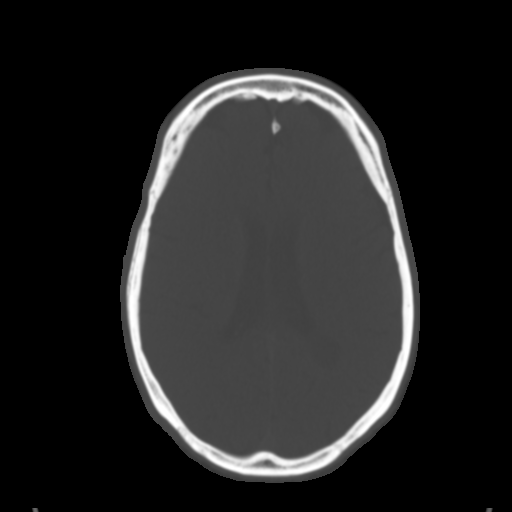
[im 24/35  brain]
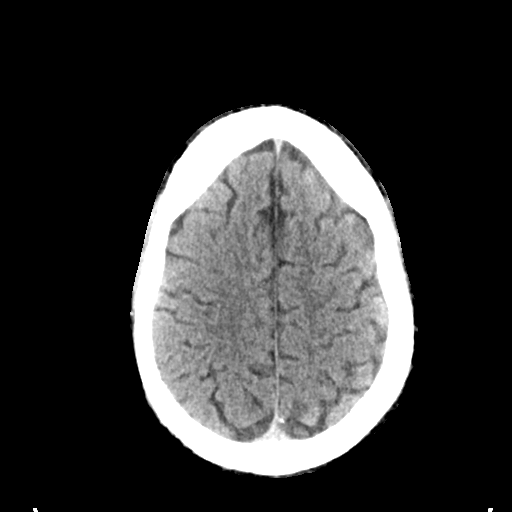
[im 27/35  brain]
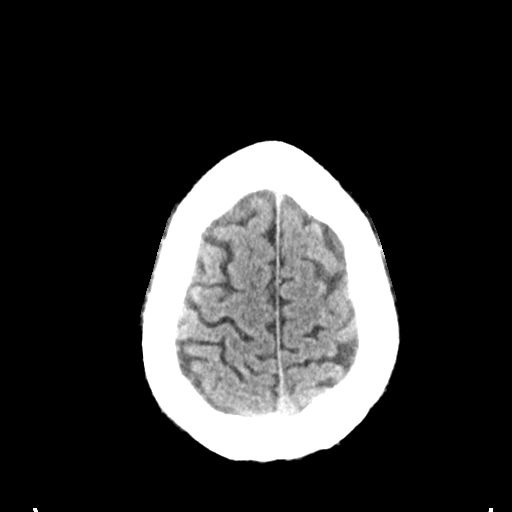
[im 32/35  brain]
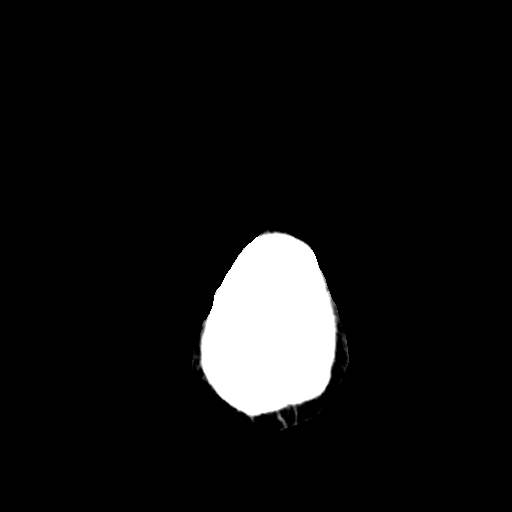

[Series 6: head without cor · coronal · non-contrast · 0.34mm/px · 3 of 70 slices shown]
[im 24/70  brain]
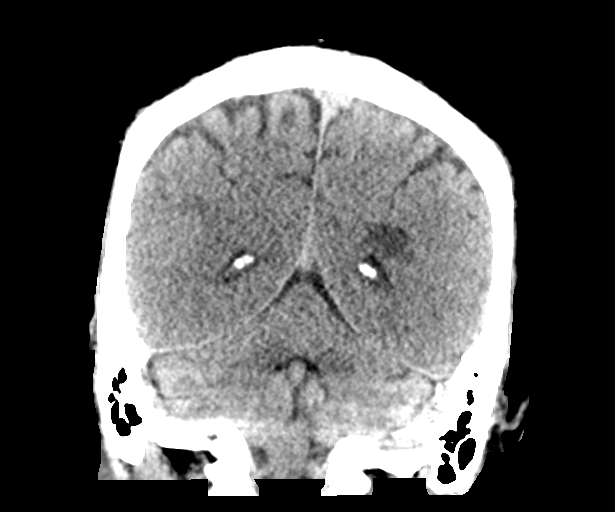
[im 31/70  brain]
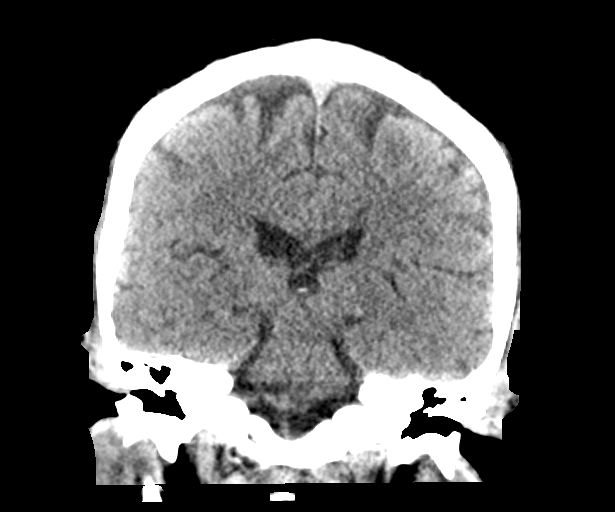
[im 39/70  brain]
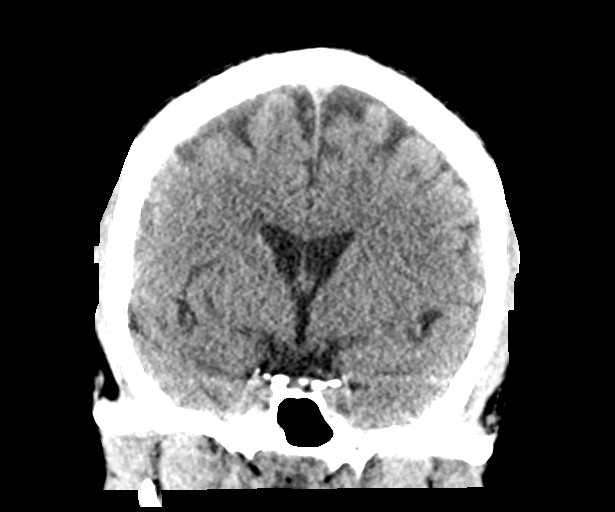

[Series 7: head without sag · sagittal · non-contrast · 0.34mm/px · 3 of 53 slices shown]
[im 18/53  brain]
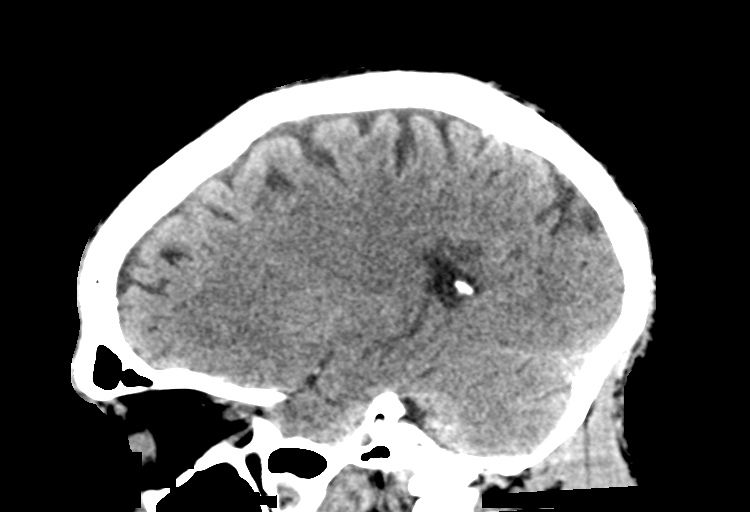
[im 27/53  brain]
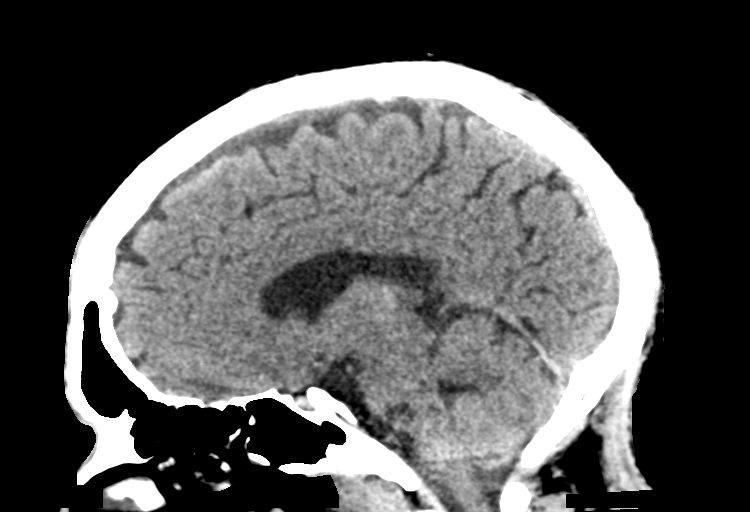
[im 35/53  brain]
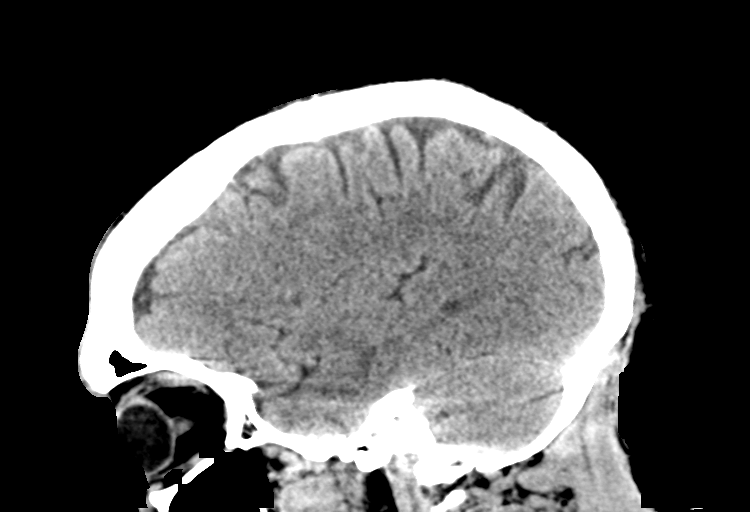

[14 of 47 positions shown; findings below may reference images not displayed]

FINDINGS: Brain: There is no evidence for acute hemorrhage, hydrocephalus,
mass lesion, or abnormal extra-axial fluid collection. No definite
CT evidence for acute infarction. Small bilateral periventricular
hypodensities are stable in the interval, compatible with patient's
known history of multiple sclerosis. Small lacunar infarct left
basal ganglia similar to 07/12/2018.

Vascular: No hyperdense vessel or unexpected calcification.

Skull: No evidence for fracture. No worrisome lytic or sclerotic
lesion.

Sinuses/Orbits: The visualized paranasal sinuses and mastoid air
cells are clear. Visualized portions of the globes and intraorbital
fat are unremarkable.

Other: None.
IMPRESSION: 1. No acute intracranial abnormality.
2. Stable bilateral periventricular hypodensities compatible with
patient's known history of multiple sclerosis.

## 2022-10-19 NOTE — Telephone Encounter (Signed)
Last seen on 08/08/22 Follow up scheduled on 11/23/22

## 2022-10-20 IMAGING — DX DG CHEST 1V PORT
1 series · 1 of 1 positions shown · non-contrast
Comparison: Chest radiograph 1 day prior

CLINICAL DATA: Respiratory failure

EXAM:
PORTABLE CHEST 1 VIEW

[chest ap]
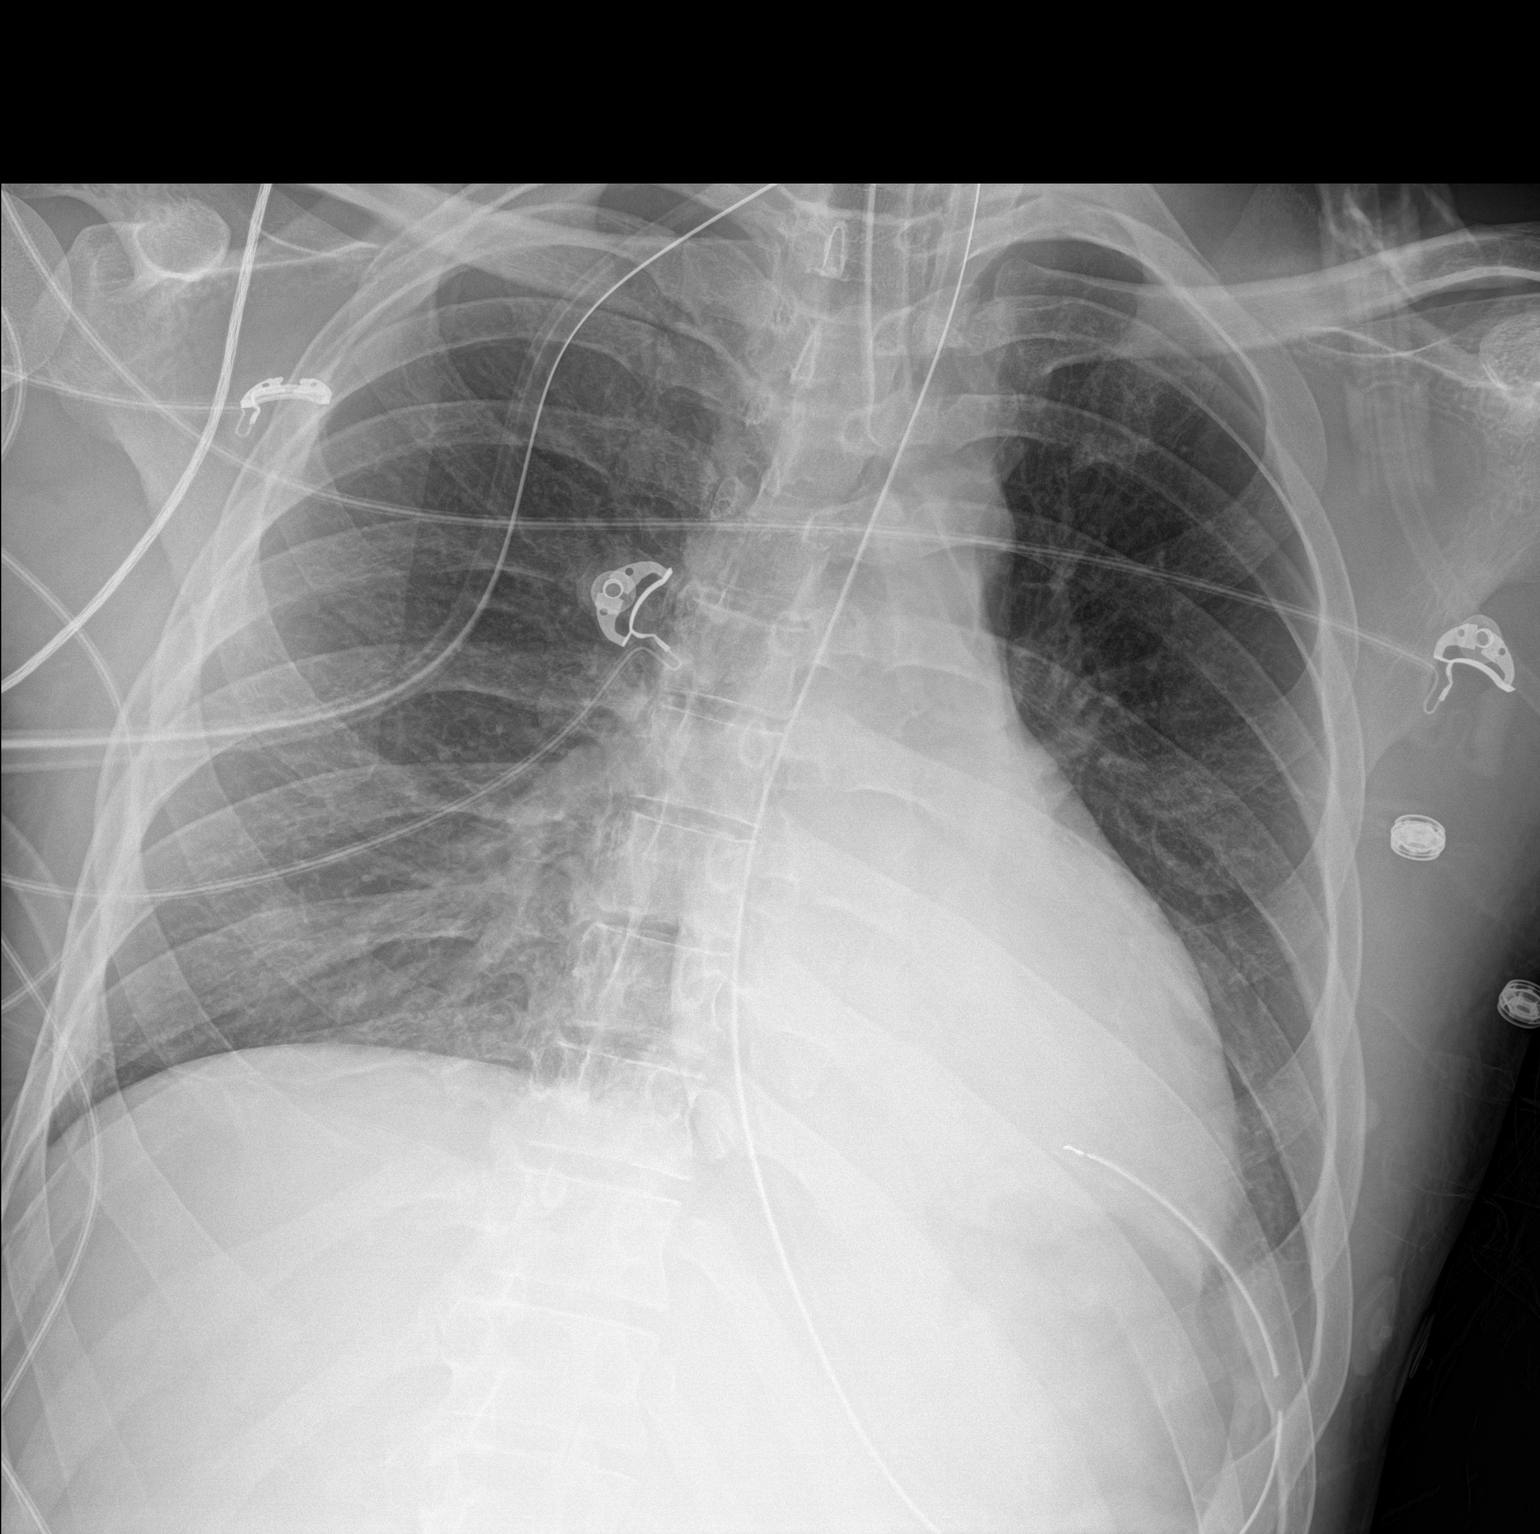

[1 of 1 positions shown; findings below may reference images not displayed]

FINDINGS: The endotracheal tube is in the thoracic trachea proximally 2.5 from
the carina. The enteric catheter tip and sidehole project over the
left upper quadrant.

The cardiomediastinal silhouette is stable, allowing for leftward
patient rotation.

There is new retrocardiac opacity. There is no other new or
worsening focal airspace disease. There is no significant pleural
effusion. There is no pneumothorax.

The bones are stable.
IMPRESSION: New retrocardiac opacity may reflect left lower lobe atelectasis,
infection, or aspiration.

## 2022-10-26 ENCOUNTER — Telehealth: Payer: Self-pay | Admitting: Family Medicine

## 2022-10-26 ENCOUNTER — Other Ambulatory Visit: Payer: Self-pay | Admitting: *Deleted

## 2022-10-26 DIAGNOSIS — G35 Multiple sclerosis: Secondary | ICD-10-CM

## 2022-10-26 MED ORDER — OCREVUS 300 MG/10ML IV SOLN: 600.0000 mg | INTRAVENOUS | 1 refills | Status: AC

## 2022-10-26 NOTE — Telephone Encounter (Signed)
LVM at 2:27 pm [facility] Marcelino Duster); calling on behalf of Miguel Black. Trying  to get clarification on prescription on prescription for  ocrelizumab (OCREVUS) 300 MG/10ML injection . Look like the prescription direction have uit to infuse 600 mg and the quantityu is only for 300mg . So we  would need that extra 10mg  for quantity of 20 for the whole 600mg  dose. We can take clarification by phone if wanted to call back in and speak directly to a pharmacists or you want to send a new prescription. Phone: [phone] presss option 3, Fax: [phone]

## 2022-10-26 NOTE — Telephone Encounter (Signed)
Sent in updated rx to pharmacy for 20ml. Selena Batten, RN/intrafusion updated

## 2022-11-09 ENCOUNTER — Ambulatory Visit: Payer: Medicare (Managed Care) | Admitting: Neurology

## 2022-11-12 ENCOUNTER — Inpatient Hospital Stay (HOSPITAL_COMMUNITY)
Admission: EM | Admit: 2022-11-12 | Discharge: 2022-11-15 | DRG: 101 | Disposition: A | Discharge status: Home Health | Payer: Medicare (Managed Care) | Attending: Student | Admitting: Student

## 2022-11-12 ENCOUNTER — Emergency Department (HOSPITAL_COMMUNITY): Payer: Medicare (Managed Care)

## 2022-11-12 ENCOUNTER — Other Ambulatory Visit: Payer: Self-pay

## 2022-11-12 ENCOUNTER — Encounter (HOSPITAL_COMMUNITY): Payer: Self-pay

## 2022-11-12 DIAGNOSIS — L89152 Pressure ulcer of sacral region, stage 2: Secondary | ICD-10-CM | POA: Diagnosis present

## 2022-11-12 DIAGNOSIS — E875 Hyperkalemia: Secondary | ICD-10-CM | POA: Diagnosis present

## 2022-11-12 DIAGNOSIS — Z79620 Long term (current) use of immunosuppressive biologic: Secondary | ICD-10-CM | POA: Diagnosis not present

## 2022-11-12 DIAGNOSIS — R569 Unspecified convulsions: Secondary | ICD-10-CM

## 2022-11-12 DIAGNOSIS — F17290 Nicotine dependence, other tobacco product, uncomplicated: Secondary | ICD-10-CM | POA: Diagnosis present

## 2022-11-12 DIAGNOSIS — G35 Multiple sclerosis: Secondary | ICD-10-CM | POA: Diagnosis present

## 2022-11-12 DIAGNOSIS — Z807 Family history of other malignant neoplasms of lymphoid, hematopoietic and related tissues: Secondary | ICD-10-CM | POA: Diagnosis not present

## 2022-11-12 DIAGNOSIS — Z841 Family history of disorders of kidney and ureter: Secondary | ICD-10-CM

## 2022-11-12 DIAGNOSIS — G9340 Encephalopathy, unspecified: Secondary | ICD-10-CM | POA: Diagnosis not present

## 2022-11-12 DIAGNOSIS — Z823 Family history of stroke: Secondary | ICD-10-CM

## 2022-11-12 DIAGNOSIS — Z79899 Other long term (current) drug therapy: Secondary | ICD-10-CM

## 2022-11-12 DIAGNOSIS — Z8261 Family history of arthritis: Secondary | ICD-10-CM

## 2022-11-12 DIAGNOSIS — G40909 Epilepsy, unspecified, not intractable, without status epilepticus: Secondary | ICD-10-CM | POA: Diagnosis not present

## 2022-11-12 DIAGNOSIS — Z8249 Family history of ischemic heart disease and other diseases of the circulatory system: Secondary | ICD-10-CM

## 2022-11-12 DIAGNOSIS — Z888 Allergy status to other drugs, medicaments and biological substances status: Secondary | ICD-10-CM

## 2022-11-12 DIAGNOSIS — Z800 Family history of malignant neoplasm of digestive organs: Secondary | ICD-10-CM

## 2022-11-12 DIAGNOSIS — G40901 Epilepsy, unspecified, not intractable, with status epilepticus: Secondary | ICD-10-CM | POA: Diagnosis not present

## 2022-11-12 DIAGNOSIS — Z833 Family history of diabetes mellitus: Secondary | ICD-10-CM | POA: Diagnosis not present

## 2022-11-12 DIAGNOSIS — R4182 Altered mental status, unspecified: Secondary | ICD-10-CM | POA: Diagnosis not present

## 2022-11-12 DIAGNOSIS — D84821 Immunodeficiency due to drugs: Secondary | ICD-10-CM | POA: Diagnosis present

## 2022-11-12 LAB — CBC WITH DIFFERENTIAL/PLATELET
Abs Immature Granulocytes: 0.13 10*3/uL — ABNORMAL HIGH (ref 0.00–0.07)
Basophils Absolute: 0.1 10*3/uL (ref 0.0–0.1)
Basophils Relative: 0 %
Eosinophils Absolute: 0 10*3/uL (ref 0.0–0.5)
Eosinophils Relative: 0 %
HCT: 47.6 % (ref 39.0–52.0)
Hemoglobin: 15.3 g/dL (ref 13.0–17.0)
Immature Granulocytes: 1 %
Lymphocytes Relative: 12 %
Lymphs Abs: 1.7 10*3/uL (ref 0.7–4.0)
MCH: 31.3 pg (ref 26.0–34.0)
MCHC: 32.1 g/dL (ref 30.0–36.0)
MCV: 97.3 fL (ref 80.0–100.0)
Monocytes Absolute: 0.4 10*3/uL (ref 0.1–1.0)
Monocytes Relative: 3 %
Neutro Abs: 11.4 10*3/uL — ABNORMAL HIGH (ref 1.7–7.7)
Neutrophils Relative %: 84 %
Platelets: 256 10*3/uL (ref 150–400)
RBC: 4.89 MIL/uL (ref 4.22–5.81)
RDW: 13.2 % (ref 11.5–15.5)
WBC: 13.7 10*3/uL — ABNORMAL HIGH (ref 4.0–10.5)
nRBC: 0 % (ref 0.0–0.2)

## 2022-11-12 LAB — ETHANOL: Alcohol, Ethyl (B): <10 mg/dL (ref <10)

## 2022-11-12 LAB — COMPREHENSIVE METABOLIC PANEL
ALT: 20 U/L (ref 0–44)
AST: 24 U/L (ref 15–41)
Albumin: 4 g/dL (ref 3.5–5.0)
Alkaline Phosphatase: 64 U/L (ref 38–126)
Anion gap: 12 (ref 5–15)
BUN: 10 mg/dL (ref 6–20)
CO2: 23 mmol/L (ref 22–32)
Calcium: 9 mg/dL (ref 8.9–10.3)
Chloride: 105 mmol/L (ref 98–111)
Creatinine, Ser: 1.11 mg/dL (ref 0.61–1.24)
GFR, Estimated: >60 mL/min (ref >60)
Glucose, Bld: 123 mg/dL — ABNORMAL HIGH (ref 70–99)
Potassium: 5.4 mmol/L — ABNORMAL HIGH (ref 3.5–5.1)
Sodium: 140 mmol/L (ref 135–145)
Total Bilirubin: 0.3 mg/dL (ref 0.3–1.2)
Total Protein: 7.4 g/dL (ref 6.5–8.1)

## 2022-11-12 LAB — VALPROIC ACID LEVEL: Valproic Acid Lvl: 57 ug/mL (ref 50.0–100.0)

## 2022-11-12 MED ORDER — ENOXAPARIN SODIUM 40 MG/0.4ML IJ SOSY
40.0000 mg | PREFILLED_SYRINGE | INTRAMUSCULAR | Status: DC
  Administered 2022-11-13 – 2022-11-15 (×3): 40 mg via SUBCUTANEOUS
  Filled 2022-11-12 (×3): qty 0.4

## 2022-11-12 MED ORDER — ACETAMINOPHEN 325 MG PO TABS: 650.0000 mg | ORAL_TABLET | Freq: Four times a day (QID) | ORAL | Status: DC | PRN

## 2022-11-12 MED ORDER — LEVETIRACETAM IN NACL 1000 MG/100ML IV SOLN
1000.0000 mg | Freq: Once | INTRAVENOUS | Status: AC
  Administered 2022-11-12: 1000 mg via INTRAVENOUS
  Filled 2022-11-12: qty 100

## 2022-11-12 MED ORDER — VALPROATE SODIUM 100 MG/ML IV SOLN
750.0000 mg | Freq: Two times a day (BID) | INTRAVENOUS | Status: DC
  Administered 2022-11-12 – 2022-11-13 (×2): 750 mg via INTRAVENOUS
  Filled 2022-11-12 (×5): qty 7.5

## 2022-11-12 MED ORDER — POLYETHYLENE GLYCOL 3350 17 G PO PACK: 17.0000 g | PACK | Freq: Every day | ORAL | Status: DC | PRN

## 2022-11-12 MED ORDER — SODIUM CHLORIDE 0.45 % IV SOLN: INTRAVENOUS | Status: AC

## 2022-11-12 MED ORDER — SODIUM CHLORIDE 0.9 % IV BOLUS
500.0000 mL | INTRAVENOUS | Status: AC
  Administered 2022-11-13: 500 mL via INTRAVENOUS

## 2022-11-12 MED ORDER — SODIUM CHLORIDE 0.9 % IV SOLN: 2.0000 g | INTRAVENOUS | Status: DC

## 2022-11-12 MED ORDER — PROCHLORPERAZINE EDISYLATE 10 MG/2ML IJ SOLN: 5.0000 mg | Freq: Four times a day (QID) | INTRAMUSCULAR | Status: DC | PRN

## 2022-11-12 MED ORDER — LORAZEPAM 2 MG/ML IJ SOLN
INTRAMUSCULAR | Status: AC
  Administered 2022-11-12: 2 mg
  Filled 2022-11-12: qty 1

## 2022-11-12 MED ORDER — LORAZEPAM 2 MG/ML IJ SOLN: 2.0000 mg | Freq: Four times a day (QID) | INTRAMUSCULAR | Status: DC | PRN

## 2022-11-12 MED ORDER — MELATONIN 5 MG PO TABS
5.0000 mg | ORAL_TABLET | Freq: Every evening | ORAL | Status: DC | PRN
  Administered 2022-11-14: 5 mg via ORAL
  Filled 2022-11-12: qty 1

## 2022-11-12 MED ORDER — SODIUM CHLORIDE 0.9 % IV SOLN
2.0000 g | INTRAVENOUS | Status: DC
  Administered 2022-11-13 – 2022-11-14 (×3): 2 g via INTRAVENOUS
  Filled 2022-11-12 (×3): qty 20

## 2022-11-12 MED ORDER — SODIUM CHLORIDE 0.9 % IV SOLN: 2000.0000 mg | Freq: Once | INTRAVENOUS | Status: DC

## 2022-11-12 MED ORDER — LEVETIRACETAM 500 MG PO TABS: 2000.0000 mg | ORAL_TABLET | Freq: Once | ORAL | Status: DC

## 2022-11-12 NOTE — Consult Note (Addendum)
NEUROLOGY CONSULT NOTE   Date of service: November 12, 2022 Patient Name: Miguel Black MRN:  [id number] DOB:  February 28, 1990 Chief Complaint: "Seizures" Requesting Provider: Gwyneth Sprout, MD  History of Present Illness  Miguel Black is a 32 y.o. male with hx of MS on Ocrevus, seizures on Depakote and most recently has been compliant with his Depakote.  He was brought in the ED today after having multiple seizures at home.  Spoke with patient's mother who reports that he was sitting and chatting with his family when he abruptly stopped talking, staring off and had a witnessed right arm shaking with his eyes open.  The shaking went on for a minute or 2 and resolved.  Family was able to move him to the bedroom where he had 5 additional episodes of right arm jerking with eyes open lasting about a minute and he kept going in and out of the spells.  Mom reports that he has been doing really good with Depakote and has been taking his medication as prescribed.  He has not missed a single dose.  No recent fever, signs of symptoms of any infections.  Specifically, no URI, no UTI or gastroenteritis like symptoms that he reported to mom recently.  Mother reports that he is at home and rarely ever leaves the house and is not exposed to anyone who is sick that she is aware of.  He had 2 seizures en route with EMS and was given Versed.  He proceeded to have 2 additional seizures in the ED for total of 9 seizures today.  He got Ativan 2 mg in the ED and got loaded with Keppra.  His Depakote levels are at 57.  Despite waiting several hours in the ED, he is still obtunded and neurology was consulted further evaluation and workup.  Upon my evaluation, he tosses and turn but is difficult to arouse with purposeful movements.  I do not see any arm or leg shaking concerning for seizures.  I suspect that he is likely postictal at this time.    ROS  Unable to ascertain due to obtunded mentation.  Past  History   Past Medical History:  Diagnosis Date   Acute encephalopathy 04/26/2022   Acute metabolic encephalopathy 04/26/2022   Acute respiratory failure with hypoxia (HCC) 08/23/2021   Aspiration pneumonia (HCC) 07/12/2018   Closed fracture of distal phalanx or phalanges of hand 02/05/2009   Formatting of this note might be different from the original. Overview: The original diagnosis was replaced automatically by the ICD-10 team to an ICD-10 compliant one. Please review for accuracy   Encounter for screening for HIV 07/12/2018   Foot drop 06/03/2022   Has a left foot drop ever since 2015 first flare of multiple sclerosis had a brace in 2022 custom but does not actually do anything to lift the from the foot physical therapist advised him to get an alternative foot drop brace but I cannot find record of that   Marijuana abuse 05/01/2013   MS (multiple sclerosis) (HCC)    Seizure (HCC)    Seizures (HCC) 01/22/2019   Status epilepticus (HCC) 07/12/2018    Past Surgical History:  Procedure Laterality Date   NO PAST SURGERIES      Family History: Family History  Problem Relation Age of Onset   Arthritis Mother    Hypertension Mother    Healthy Father    Healthy Sister    Healthy Brother    Hodgkin's lymphoma Brother  Colon cancer Maternal Uncle 45   Kidney disease Maternal Grandmother    Hypertension Maternal Grandmother    Arthritis Maternal Grandmother    Diabetes Maternal Grandmother    Stroke Maternal Grandfather    Hypertension Maternal Grandfather    Diabetes Paternal Grandmother     Social History  reports that he has been smoking cigars. He has never used smokeless tobacco. He reports current alcohol use. He reports current drug use. Drug: Marijuana.  Allergies  Allergen Reactions   Dalfampridine Other (See Comments)    Seizure 02/2021 while on dalfampridine    Medications  No current facility-administered medications for this encounter.  Current  Outpatient Medications:    diazePAM (VALTOCO 5 MG DOSE) 5 MG/0.1ML LIQD, Place 1 spray into the nose daily as needed., Disp: 2 each, Rfl: 1   divalproex (DEPAKOTE) 500 MG DR tablet, Take 1 tablet (500 mg total) by mouth every 12 (twelve) hours., Disp: 60 tablet, Rfl: 3   ocrelizumab (OCREVUS) 300 MG/10ML injection, Inject 20 mLs (600 mg total) into the vein every 6 (six) months., Disp: 20 mL, Rfl: 1  Vitals   Vitals:   11/12/22 1830 11/12/22 1900 11/12/22 1918 11/12/22 2004  BP: 120/69 115/71  119/68  Pulse: 72 73  76  Resp: 18 16  16   Temp:    98.9 F (37.2 C)  TempSrc:    Axillary  SpO2: 100% 100% 100% 100%  Weight:      Height:        Body mass index is 19.06 kg/m.  Physical Exam   Constitutional: Appears well-developed and well-nourished.  Psych: Affect appropriate to situation.  Eyes: No scleral injection.  HENT: No OP obstruction.  Head: Normocephalic.  Cardiovascular: Normal rate and regular rhythm.  Respiratory: Effort normal, non-labored breathing.  GI: Soft.  No distension. There is no tenderness.  Skin: WDI.    Neurologic Examination  Mental status/Cognition: obtunded mentation and does not open eyes to loud voice, clap, vigorous tactile stimulation.  He does not answer any orientation questions.  2 nares stimulation, he grimaces, moves his head around wildly and tries to pull the Q-tip out of his nose with both of his upper extremities. Speech/language: Mute, no speech, does not follow commands, no attempt to communicate. Cranial nerves:   CN II Pupils equal and reactive to light.  Unable to assess for visual field deficit.  He keeps his eyes shut.   CN III,IV,VI Resists eye opening, no gaze preference or deviation noted.  Doll's eye is intact.   CN V Corneal reflex intact.   CN VII Symmetric facial grimace to nares stimulation.   CN VIII Does not turn head towards speech.   CN IX & X Protecting his airway so far.  Did have cough to nares stimulation.   CN XI  Head is midline, difficult to assess.   CN XII Unable to assess.   Sensory/Motor:  Muscle bulk: Normal, tone normal Localizes to proximal pinch in all extremities eiyh  antigravity movement noted in all extremities to painful stimuli.  Reflexes:  Right Left Comments  Pectoralis      Biceps (C5/6) 2 2   Brachioradialis (C5/6) 2 2    Triceps (C6/7) 2 2    Patellar (L3/4) 2 2    Achilles (S1)      Hoffman      Plantar     Jaw jerk    Coordination/Complex Motor:  Unable to assess coordination.  Labs   CBC:  Recent  Labs  Lab 11/12/22 1532  WBC 13.7*  NEUTROABS 11.4*  HGB 15.3  HCT 47.6  MCV 97.3  PLT 256    Basic Metabolic Panel:  Lab Results  Component Value Date   NA 140 11/12/2022   K 5.4 (H) 11/12/2022   CO2 23 11/12/2022   GLUCOSE 123 (H) 11/12/2022   BUN 10 11/12/2022   CREATININE 1.11 11/12/2022   CALCIUM 9.0 11/12/2022   GFRNONAA >60 11/12/2022   GFRAA 99 07/22/2019   Lipid Panel:  Lab Results  Component Value Date   LDLCALC 113 (H) 06/03/2022   HgbA1c:  Lab Results  Component Value Date   HGBA1C 5.5 08/22/2021   Urine Drug Screen:     Component Value Date/Time   LABOPIA NONE DETECTED 06/13/2022 1452   COCAINSCRNUR NONE DETECTED 06/13/2022 1452   LABBENZ POSITIVE (A) 06/13/2022 1452   AMPHETMU NONE DETECTED 06/13/2022 1452   THCU POSITIVE (A) 06/13/2022 1452   LABBARB NONE DETECTED 06/13/2022 1452    Alcohol Level     Component Value Date/Time   ETH <10 11/12/2022 1532   INR No results found for: "INR" APTT No results found for: "APTT" AED levels:  Lab Results  Component Value Date   LEVETIRACETA <2.0 (L) 06/13/2022     CT Head without contrast(Personally reviewed): CTH was negative for a large hypodensity concerning for a large territory infarct or hyperdensity concerning for an ICH  MRI Brain(Personally reviewed): No need for MRI Brain at this time. Can be obtained in AM if still obtunded mentation.  cEEG:   pending  Impression   Jehiel CARMICHAEL HOPPS is a 32 y.o. male with known history of MS, seizures on Depakote and compliant with his medication more recently who presents with breakthrough seizure despite compliance with his Depakote.  No obvious provoking factors identified, no obvious infections, no sleep deprivation, has not missed any doses of his medication, does not drink alcohol, mother is not aware of him using any recreational substances recently.  I suspect the seizures were unprovoked and it seems that he has had clustering of seizures with at least 9 seizures over the last 24 hours.   He has obtunded mentation and suspect this is likely postictal and sedation from Versed and Ativan.  However, given several seizures noted earlier, I am still inclined to put him on LTM EEG overnight to rule out any subclinical seizures.  Recommendations  - admit for observation overnight for seizure clustering. - Agree with Keppra load but has had suicidal ideation with maintenance Keppra in the past and therefore will avoid any longterm Keppra. - LTM EEG to evaluate for subclinical seizures. - increased Valproic acid to 750mg  BID. He has not been feeling suicidal with Valproic acid per my discussion with his mother and is compliant with VPA. - Ativan 1-2mg  for seizure lasting more than 3 mins. - seizure precautions with seizure pads. - we will continue to follow along. - can consider MRI brain in AM if still obtunded. Has had multiple MRI brains throughout the year with stable MS. - infectious screen per ED and primary team. This is important since he is immunosuppressed from Ocrevus. ______________________________________________________________________  Plan discussed with Trisha Mangle with the ED team. Plan also discussed with patient's mother over phone.  Signed,  Erick Blinks

## 2022-11-12 NOTE — ED Notes (Signed)
Pt had seizure like activity while in CT. Patient became tense and gazed to the left with an episode of emesis. MD notified.

## 2022-11-12 NOTE — H&P (Addendum)
History and Physical  Miguel Black QMV:[id number] DOB: 1990/11/02 DOA: 11/12/2022  Referring physician: Ok Edwards, PA-EDP  PCP: Lula Olszewski, MD  Outpatient Specialists: Neurology. Patient coming from: Home.  Chief Complaint: Multiple seizures.  HPI: Miguel Black is a 32 y.o. male with medical history significant for MS, ambulatory dysfunction uses a cane, Keppra induced suicidal ideation, seizure disorder on Depakote and Ocrevus injection every 6 months, reportedly compliant with his medications, who presents to the ED via EMS due to breakthrough seizures.  The patient had 5 seizures at home today.  When EMS arrived he had another 2 seizures.  Received IM Versed by EMS.  Vomited en route.  After he arrived to the ED he had another 2 seizures.  In the ED, seen by neurology upon arrival, the patient received 2 doses of loading Keppra 1 g over 15 minutes each, and was started on IV Depakote 750 mg twice daily.  Neurology recommended admission for overnight EEG with video.    Obtunded after having 9 witnessed seizures.  However, responding to painful stimuli, and appears to be protecting his airway.    Critical care medicine consulted to assist with the management.  Admitted by [facility], hospitalist service.  ED Course: Temperature 98.9.  BP 114/73, pulse 90, respiratory rate 13, saturation 100% on room air.  Lab studies notable for serum potassium 5.4, glucose 123.  WBC 13.7, hemoglobin 13.3, platelet count 256.  Review of Systems: Review of systems as noted in the HPI. All other systems reviewed and are negative.   Past Medical History:  Diagnosis Date   Acute encephalopathy 04/26/2022   Acute metabolic encephalopathy 04/26/2022   Acute respiratory failure with hypoxia (HCC) 08/23/2021   Aspiration pneumonia (HCC) 07/12/2018   Closed fracture of distal phalanx or phalanges of hand 02/05/2009   Formatting of this note might be different from the original. Overview:  The original diagnosis was replaced automatically by the ICD-10 team to an ICD-10 compliant one. Please review for accuracy   Encounter for screening for HIV 07/12/2018   Foot drop 06/03/2022   Has a left foot drop ever since 2015 first flare of multiple sclerosis had a brace in 2022 custom but does not actually do anything to lift the from the foot physical therapist advised him to get an alternative foot drop brace but I cannot find record of that   Marijuana abuse 05/01/2013   MS (multiple sclerosis) (HCC)    Seizure (HCC)    Seizures (HCC) 01/22/2019   Status epilepticus (HCC) 07/12/2018   Past Surgical History:  Procedure Laterality Date   NO PAST SURGERIES      Social History:  reports that he has been smoking cigars. He has never used smokeless tobacco. He reports current alcohol use. He reports current drug use. Drug: Marijuana.   Allergies  Allergen Reactions   Dalfampridine Other (See Comments)    Seizure 02/2021 while on dalfampridine    Family History  Problem Relation Age of Onset   Arthritis Mother    Hypertension Mother    Healthy Father    Healthy Sister    Healthy Brother    Hodgkin's lymphoma Brother    Colon cancer Maternal Uncle 45   Kidney disease Maternal Grandmother    Hypertension Maternal Grandmother    Arthritis Maternal Grandmother    Diabetes Maternal Grandmother    Stroke Maternal Grandfather    Hypertension Maternal Grandfather    Diabetes Paternal Grandmother  Prior to Admission medications   Medication Sig Start Date End Date Taking? Authorizing Provider  diazePAM (VALTOCO 5 MG DOSE) 5 MG/0.1ML LIQD Place 1 spray into the nose daily as needed. 10/03/22   Lula Olszewski, MD  divalproex (DEPAKOTE) 500 MG DR tablet Take 1 tablet (500 mg total) by mouth every 12 (twelve) hours. 10/09/22   Rhetta Mura, MD  ocrelizumab (OCREVUS) 300 MG/10ML injection Inject 20 mLs (600 mg total) into the vein every 6 (six) months. 10/26/22   Sater,  Pearletha Furl, MD    Physical Exam: BP 114/73   Pulse 90   Temp 98.9 F (37.2 C) (Axillary)   Resp 13   Ht 5\' 11"  (1.803 m)   Wt 62 kg   SpO2 100%   BMI 19.06 kg/m   General: 32 y.o. year-old male well developed well nourished in no acute distress.  Obtunded, responds to painful stimuli.  Appears to be protecting his airway. Cardiovascular: Regular rate and rhythm with no rubs or gallops.  No thyromegaly or JVD noted.  No lower extremity edema. 2/4 pulses in all 4 extremities. Respiratory: Clear to auscultation with no wheezes or rales. Good inspiratory effort. Abdomen: Soft nontender nondistended with normal bowel sounds x4 quadrants. Muskuloskeletal: No cyanosis, clubbing or edema noted bilaterally Neuro: CN II-XII intact, strength, sensation, reflexes Skin: No ulcerative lesions noted or rashes Psychiatry: Unable to assess judgment and mood due to obtundation.         Labs on Admission:  Basic Metabolic Panel: Recent Labs  Lab 11/12/22 1532  NA 140  K 5.4*  CL 105  CO2 23  GLUCOSE 123*  BUN 10  CREATININE 1.11  CALCIUM 9.0   Liver Function Tests: Recent Labs  Lab 11/12/22 1532  AST 24  ALT 20  ALKPHOS 64  BILITOT 0.3  PROT 7.4  ALBUMIN 4.0   No results for input(s): "LIPASE", "AMYLASE" in the last 168 hours. No results for input(s): "AMMONIA" in the last 168 hours. CBC: Recent Labs  Lab 11/12/22 1532  WBC 13.7*  NEUTROABS 11.4*  HGB 15.3  HCT 47.6  MCV 97.3  PLT 256   Cardiac Enzymes: No results for input(s): "CKTOTAL", "CKMB", "CKMBINDEX", "TROPONINI" in the last 168 hours.  BNP (last 3 results) No results for input(s): "BNP" in the last 8760 hours.  ProBNP (last 3 results) No results for input(s): "PROBNP" in the last 8760 hours.  CBG: No results for input(s): "GLUCAP" in the last 168 hours.  Radiological Exams on Admission: DG Chest Port 1 View  Result Date: 11/12/2022 CLINICAL DATA:  Seizure EXAM: PORTABLE CHEST 1 VIEW COMPARISON:   10/08/2022 FINDINGS: The heart size and mediastinal contours are within normal limits. Small nodular density projecting over the right lung base, likely nipple shadow. Lungs are otherwise clear. No pleural effusion or pneumothorax. The visualized skeletal structures are unremarkable. IMPRESSION: 1. No acute cardiopulmonary findings. 2. Small nodular density projecting over the right lung base, likely nipple shadow. Repeat radiograph with nipple markers could be performed to confirm. Electronically Signed   By: Duanne Guess D.O.   On: 11/12/2022 17:57   CT Head Wo Contrast  Result Date: 11/12/2022 CLINICAL DATA:  Seizure disorder, clinical change EXAM: CT HEAD WITHOUT CONTRAST TECHNIQUE: Contiguous axial images were obtained from the base of the skull through the vertex without intravenous contrast. RADIATION DOSE REDUCTION: This exam was performed according to the departmental dose-optimization program which includes automated exposure control, adjustment of the mA and/or kV according to  patient size and/or use of iterative reconstruction technique. COMPARISON:  Head CT 10/06/2022 FINDINGS: Brain: No evidence of acute infarction, hemorrhage, hydrocephalus, extra-axial collection or mass lesion/mass effect. Low densities in the cerebral white matter related to known diagnosis of multiple sclerosis, unchanged in CT appearance. Vascular: No hyperdense vessel or unexpected calcification. Skull: No fracture or focal lesion. Sinuses/Orbits: No acute finding. Other: None. IMPRESSION: 1. No acute intracranial abnormality. 2. Low densities in the cerebral white matter related to known diagnosis of multiple sclerosis, unchanged in CT appearance. Electronically Signed   By: Narda Rutherford M.D.   On: 11/12/2022 17:54    EKG: I independently viewed the EKG done and my findings are as followed: Sinus rhythm rate of 81.  Nonspecific ST-T changes per QTc 433.  Assessment/Plan Present on  Admission: **None**  Active Problems:   Seizures (HCC)  Multiple breakthrough seizures x 9 Seizure disorder with compliance with home regimen Prior to admission on Depakote 500 mg twice daily and Ocrevus injection every 6 months Seen by neurology Seizure precautions are in place IV Ativan for breakthrough seizures Continuous EEG with video as recommended by neurology Obtain lactic acid level  Acute metabolic encephalopathy secondary to postictal state Treat underlying condition Reorient as needed Aspiration, seizures, and fall precautions are in place.  Hyperkalemia Potassium 5.4 Repeat potassium level and treat as indicated.  Leukocytosis, possibly reactive from seizures activity WBC on presentation 13.7 with neutrophil count of 11.4 Chest x-ray was nonacute Follow UA, baseline procalcitonin, urine culture, and peripheral blood cultures x 2 Repeat CBC in the morning.  Possible aspiration with emesis en route via EMS Immunosuppressed on Ocrevus Will cover with Rocephin x 5 days Follow urine culture and peripheral blood cultures x 2 Monitor fever curve and WBCs Repeat CBC and obtain a baseline procalcitonin in the morning  MS with ambulatory dysfunction Uses a cane at baseline When more alert and stable, PT OT to evaluate with fall precautions   Critical care time: 65 minutes.   DVT prophylaxis: Subcu Lovenox daily  Code Status: Full code.  Family Communication: None at bedside.  Disposition Plan: Admitted to progressive care unit.  Consults called: Neurology, critical care consulted by EDP.  Admission status: Inpatient status.   Status is: Inpatient The patient requires at least 2 midnights for further evaluation and treatment of present condition.   Darlin Drop MD [facility] Pager [phone]  If 7PM-7AM, please contact night-coverage [email or web address] Password [facility]  11/12/2022, 10:44 PM

## 2022-11-12 NOTE — ED Notes (Signed)
Able to add on Valproic Acid

## 2022-11-12 NOTE — ED Triage Notes (Signed)
Pt BIBGEMS from home after having seizures. Per mother he has several partial seizures then 5 full body seizures then 2 with ems and given 5 mg versed IM then 2 more full body seizures. Hypoventilate, ETCO2 60, bagged back up, NPA in place. Emesis en route. Pt on nonrebreathing upon arrival.   12 minutes no seizure   120/70 CBG 120s  Keppra - SI switched meds for 1 month  Hx seizures usually focal, MS uses cane

## 2022-11-12 NOTE — ED Notes (Signed)
Pt linen and gown changed after an episode of urinary incontinence.

## 2022-11-12 NOTE — Consult Note (Addendum)
Miguel Black is a 32 year old male patient with a past medical history of multiple sclerosis on Ocrevus, seizure disorder on Depakote who presented to [facility] on 10/19 after multiple episodes of seizures.  History was taken mainly from chart dissection and ED team.  Apparently patient was in his usual state today and fully compliant with his medications.  He was talking with his family earlier and suddenly abruptly stopped talking and was witnessed to have a tonic-clonic seizure.  It lasted for a minute and self resolved.  Subsequently had 5 additional episode of right arm jerking with eyes open lasting each about a minute.  EMS were called and he was brought into the emergency department.  He had 2 additional seizures in the emergency department.  He received multiple doses of Versed and Ativan and was loaded with Keppra.   In the ED he remained obtunded for several hours and therefore ICU team is being consulted.  He was seen and evaluated by neurology who recommended admission for continuous EEG monitoring.  On encounter, patient was able to open his eyes to voice command and followed simple instructions such as open eyes and squeeze fingers.  He is protecting his airways and satting well on room air.  Physical exam GEN: lethargic and somnolent able to open eyes to voice command. HEENT: Supple neck, reactive pupils, EOMI CVS: Normal S1, normal S2, regular rate and rhythm Lungs: Clear bilateral air entry Abdomen: Soft nontender nondistended positive bowel sounds Extremities: Warm well-perfused no edema  Labs and imaging were reviewed.  Assessment and plan Miguel Black is a 32 year old male patient with a past medical history of multiple sclerosis on Ocrevus, seizure disorder on Depakote who presented to [facility] on 10/19 after multiple episodes of seizures.  Patient remained somnolent in the ED postictal and therefore critical care team was consulted.  On encounter, he was  protecting his airways.  Opens his eyes to voice commands and follows simple commands.  He does not require ICU level of care at this time.  Recommended neuro stepdown unit for continuous EEG monitoring.  Please do not hesitate to contact us for any questions or concerns.  I spent 55 minutes caring for this patient today, including preparing to see the patient, obtaining a medical history , reviewing a separately obtained history, performing a medically appropriate examination and/or evaluation, referring and communicating with other health care professionals (not separately reported), documenting clinical information in the electronic health record, and independently interpreting results (not separately reported/billed) and communicating results to the patient/family/caregiver  Janann Colonel, MD [facility] 11/12/2022 11:56 PM

## 2022-11-12 NOTE — ED Notes (Signed)
Pt had seizure like activity lasting approximately five minutes.Provider responded to bedside and IV ativan given. Pt presented tense and full body tremors. Pt eyes deviates to the left.

## 2022-11-12 NOTE — ED Provider Notes (Signed)
[place] EMERGENCY DEPARTMENT AT [facility] Provider Note   CSN: [id number] Arrival date & time: 11/12/22  1509     History  Chief Complaint  Patient presents with   Seizures    Miguel Black is a 32 y.o. male.  Patient brought in by [facility].  Patient is reported to have had multiple seizures at home today.  Patient's mother told EMS that patient had several partial seizures and then had 5 full body seizures.  EMS reports patient had 2 full body seizures while coming to the emergency department.  Paramedic reports mother states patient has been taking his medications.  Patient has a past medical history of MS and has frequent seizures.  Patient is currently on Depakote.  He had been on Keppra in the the past but oral Keppra caused him to feel suicidal by neurology report.  On initial evaluation patient is actively seizing.  The history is provided by the patient. No language interpreter was used.  Seizures      Home Medications Prior to Admission medications   Medication Sig Start Date End Date Taking? Authorizing Provider  diazePAM (VALTOCO 5 MG DOSE) 5 MG/0.1ML LIQD Place 1 spray into the nose daily as needed. 10/03/22   Lula Olszewski, MD  divalproex (DEPAKOTE) 500 MG DR tablet Take 1 tablet (500 mg total) by mouth every 12 (twelve) hours. 10/09/22   Rhetta Mura, MD  ocrelizumab (OCREVUS) 300 MG/10ML injection Inject 20 mLs (600 mg total) into the vein every 6 (six) months. 10/26/22   Sater, Pearletha Furl, MD      Allergies    Dalfampridine    Review of Systems   Review of Systems  Unable to perform ROS: Patient unresponsive  Neurological:  Positive for seizures.  All other systems reviewed and are negative.   Physical Exam Updated Vital Signs BP 119/68 (BP Location: Left Arm)   Pulse 76   Temp 98.9 F (37.2 C) (Axillary)   Resp 16   Ht 5\' 11"  (1.803 m)   Wt 62 kg   SpO2 100%   BMI 19.06 kg/m  Physical Exam Vitals and  nursing note reviewed.  Constitutional:      General: He is in acute distress.     Appearance: He is well-developed.  HENT:     Head: Normocephalic.     Right Ear: External ear normal.     Left Ear: External ear normal.     Mouth/Throat:     Mouth: Mucous membranes are moist.  Cardiovascular:     Rate and Rhythm: Normal rate.  Pulmonary:     Effort: Pulmonary effort is normal.  Abdominal:     General: There is no distension.  Musculoskeletal:        General: Normal range of motion.     Cervical back: Normal range of motion.  Skin:    General: Skin is warm.  Neurological:     General: No focal deficit present.     Mental Status: He is oriented to person, place, and time.     ED Results / Procedures / Treatments   Labs (all labs ordered are listed, but only abnormal results are displayed) Labs Reviewed  CBC WITH DIFFERENTIAL/PLATELET - Abnormal; Notable for the following components:      Result Value   WBC 13.7 (*)    Neutro Abs 11.4 (*)    Abs Immature Granulocytes 0.13 (*)    All other components within normal limits  COMPREHENSIVE  METABOLIC PANEL - Abnormal; Notable for the following components:   Potassium 5.4 (*)    Glucose, Bld 123 (*)    All other components within normal limits  ETHANOL  VALPROIC ACID LEVEL  URINALYSIS, ROUTINE W REFLEX MICROSCOPIC  RAPID URINE DRUG SCREEN, HOSP PERFORMED    EKG None  Radiology DG Chest Port 1 View  Result Date: 11/12/2022 CLINICAL DATA:  Seizure EXAM: PORTABLE CHEST 1 VIEW COMPARISON:  10/08/2022 FINDINGS: The heart size and mediastinal contours are within normal limits. Small nodular density projecting over the right lung base, likely nipple shadow. Lungs are otherwise clear. No pleural effusion or pneumothorax. The visualized skeletal structures are unremarkable. IMPRESSION: 1. No acute cardiopulmonary findings. 2. Small nodular density projecting over the right lung base, likely nipple shadow. Repeat radiograph with  nipple markers could be performed to confirm. Electronically Signed   By: Duanne Guess D.O.   On: 11/12/2022 17:57   CT Head Wo Contrast  Result Date: 11/12/2022 CLINICAL DATA:  Seizure disorder, clinical change EXAM: CT HEAD WITHOUT CONTRAST TECHNIQUE: Contiguous axial images were obtained from the base of the skull through the vertex without intravenous contrast. RADIATION DOSE REDUCTION: This exam was performed according to the departmental dose-optimization program which includes automated exposure control, adjustment of the mA and/or kV according to patient size and/or use of iterative reconstruction technique. COMPARISON:  Head CT 10/06/2022 FINDINGS: Brain: No evidence of acute infarction, hemorrhage, hydrocephalus, extra-axial collection or mass lesion/mass effect. Low densities in the cerebral white matter related to known diagnosis of multiple sclerosis, unchanged in CT appearance. Vascular: No hyperdense vessel or unexpected calcification. Skull: No fracture or focal lesion. Sinuses/Orbits: No acute finding. Other: None. IMPRESSION: 1. No acute intracranial abnormality. 2. Low densities in the cerebral white matter related to known diagnosis of multiple sclerosis, unchanged in CT appearance. Electronically Signed   By: Narda Rutherford M.D.   On: 11/12/2022 17:54    Procedures .Critical Care  Performed by: Elson Areas, PA-C Authorized by: Elson Areas, PA-C   Critical care provider statement:    Critical care time (minutes):  75   Critical care start time:  11/12/2022 3:30 PM   Critical care end time:  11/12/2022 11:25 PM   Critical care time was exclusive of:  Separately billable procedures and treating other patients and teaching time   Critical care was necessary to treat or prevent imminent or life-threatening deterioration of the following conditions:  CNS failure or compromise, cardiac failure and respiratory failure   Critical care was time spent personally by me on the  following activities:  Blood draw for specimens, development of treatment plan with patient or surrogate, discussions with consultants, evaluation of patient's response to treatment, examination of patient, ordering and performing treatments and interventions, ordering and review of laboratory studies, ordering and review of radiographic studies, pulse oximetry and re-evaluation of patient's condition   I assumed direction of critical care for this patient from another provider in my specialty: no     Care discussed with: admitting provider   Comments:     Patient seizing on arrival.,  Patient given IV Ativan.  Patient remained postictal.  Patient had a second seizure.  Patient has not returned to baseline.  Patient to be admitted for further evaluation including continuous EEG.     Medications Ordered in ED Medications  LORazepam (ATIVAN) 2 MG/ML injection (2 mg  Given 11/12/22 1519)  levETIRAcetam (KEPPRA) IVPB 1000 mg/100 mL premix (0 mg Intravenous Stopped 11/12/22  1847)    And  levETIRAcetam (KEPPRA) IVPB 1000 mg/100 mL premix (0 mg Intravenous Stopped 11/12/22 1847)    ED Course/ Medical Decision Making/ A&P Clinical Course as of 11/12/22 2031  Sat Nov 12, 2022  1900 Valproic acid level [LS]    Clinical Course User Index [LS] Elson Areas, PA-C                                 Medical Decision Making Amount and/or Complexity of Data Reviewed Labs: ordered. Decision-making details documented in ED Course.    Details: Labs ordered reviewed and interpreted.  Patient has blood cell count of 13.  SCM is 5.3 Depakote level is 5 7. Radiology: ordered and independent interpretation performed. Decision-making details documented in ED Course.    Details: CT head ordered reviewed and interpreted.  Patient has chronic changes consistent with his MS. ECG/medicine tests: ordered and independent interpretation performed. Decision-making details documented in ED Course.    Details: EKG shows  sinus rhythm no acute changes Discussion of management or test interpretation with external provider(s): Neurology consulted.  Dr. Derry Lory evaluated pt.   I spoke to Dr. Wannetta Sender.  She request critical care consult.  Critical care consulted and advised patient is stable to admit to hospitalist.  Dr. Margo Aye will admit.  Risk Prescription drug management. Decision regarding hospitalization. Risk Details: Patient actively seizing on arrival to the emergency department.  Patient is given Ativan 2 mg IV.  Patient had decreased seizure activity.  Patient was reported to have a seizure while having CT scan.  I discussed the patient with Dr. Jerrell Belfast who advised I can load patient with IV Keppra.  CT head obtained.  CT shows changes consistent with MS no acute abnormality chest x-ray no acute findings.  Patient is no longer seizing but remains somnolent.  Patient has not returned to his baseline.           Final Clinical Impression(s) / ED Diagnoses Final diagnoses:  Status epilepticus (HCC)  Seizure (HCC)  Altered mental status, unspecified altered mental status type  Multiple sclerosis [facility])    Rx / DC Orders ED Discharge Orders     None         Osie Cheeks 11/12/22 2328    Gwyneth Sprout, MD 11/13/22 [phone]

## 2022-11-13 ENCOUNTER — Inpatient Hospital Stay (HOSPITAL_COMMUNITY): Payer: Medicare (Managed Care)

## 2022-11-13 DIAGNOSIS — R569 Unspecified convulsions: Secondary | ICD-10-CM

## 2022-11-13 LAB — BLOOD CULTURE ID PANEL (REFLEXED) - BCID2

## 2022-11-13 LAB — LACTIC ACID, PLASMA
Lactic Acid, Venous: 1.6 mmol/L (ref 0.5–1.9)
Lactic Acid, Venous: 1 mmol/L (ref 0.5–1.9)

## 2022-11-13 LAB — PROCALCITONIN: Procalcitonin: <0.1 ng/mL

## 2022-11-13 LAB — CBC
HCT: 40.3 % (ref 39.0–52.0)
Hemoglobin: 13.4 g/dL (ref 13.0–17.0)
MCH: 31.8 pg (ref 26.0–34.0)
MCHC: 33.3 g/dL (ref 30.0–36.0)
MCV: 95.5 fL (ref 80.0–100.0)
Platelets: 223 10*3/uL (ref 150–400)
RBC: 4.22 MIL/uL (ref 4.22–5.81)
RDW: 13.2 % (ref 11.5–15.5)
WBC: 16.7 10*3/uL — ABNORMAL HIGH (ref 4.0–10.5)
nRBC: 0 % (ref 0.0–0.2)

## 2022-11-13 LAB — MAGNESIUM: Magnesium: 1.9 mg/dL (ref 1.7–2.4)

## 2022-11-13 LAB — PHOSPHORUS: Phosphorus: 4.7 mg/dL — ABNORMAL HIGH (ref 2.5–4.6)

## 2022-11-13 LAB — BASIC METABOLIC PANEL
Anion gap: 10 (ref 5–15)
BUN: 9 mg/dL (ref 6–20)
CO2: 23 mmol/L (ref 22–32)
Calcium: 7.8 mg/dL — ABNORMAL LOW (ref 8.9–10.3)
Chloride: 103 mmol/L (ref 98–111)
Creatinine, Ser: 0.83 mg/dL (ref 0.61–1.24)
GFR, Estimated: >60 mL/min (ref >60)
Glucose, Bld: 95 mg/dL (ref 70–99)
Potassium: 3.5 mmol/L (ref 3.5–5.1)
Sodium: 136 mmol/L (ref 135–145)

## 2022-11-13 LAB — AMMONIA: Ammonia: 46 umol/L — ABNORMAL HIGH (ref 9–35)

## 2022-11-13 MED ORDER — LAMOTRIGINE 25 MG PO TABS: 25.0000 mg | ORAL_TABLET | Freq: Every day | ORAL | Status: DC

## 2022-11-13 MED ORDER — LAMOTRIGINE 25 MG PO TABS: 25.0000 mg | ORAL_TABLET | ORAL | Status: DC

## 2022-11-13 MED ORDER — VALPROATE SODIUM 100 MG/ML IV SOLN
500.0000 mg | Freq: Two times a day (BID) | INTRAVENOUS | Status: DC
Start: 2022-11-13 — End: 2022-11-14
  Administered 2022-11-13 – 2022-11-14 (×2): 500 mg via INTRAVENOUS
  Filled 2022-11-13 (×4): qty 5

## 2022-11-13 MED ORDER — LAMOTRIGINE 25 MG PO TABS
25.0000 mg | ORAL_TABLET | ORAL | Status: DC
  Administered 2022-11-13: 25 mg via ORAL
  Filled 2022-11-13: qty 1

## 2022-11-13 MED ORDER — LAMOTRIGINE ER 50 MG PO TB24: 50.0000 mg | ORAL_TABLET | Freq: Every day | ORAL | Status: DC

## 2022-11-13 NOTE — Progress Notes (Addendum)
Neurology Progress Note   S:// Patient is asleep in the bed in NAD. No family at the bedside.  He is on LTM.  No new neurological events overnight    O:// Current vital signs: BP 113/72 (BP Location: Right Arm)   Pulse 77   Temp 99.2 F (37.3 C) (Oral)   Resp 20   Ht 5\' 11"  (1.803 m)   Wt 62 kg   SpO2 100%   BMI 19.06 kg/m  Vital signs in last 24 hours: Temp:  [97.4 F (36.3 C)-99.3 F (37.4 C)] 99.2 F (37.3 C) (10/20 1159) Pulse Rate:  [70-125] 77 (10/20 1159) Resp:  [10-32] 20 (10/20 1159) BP: (91-141)/(51-82) 113/72 (10/20 1159) SpO2:  [96 %-100 %] 100 % (10/20 1159) Weight:  [62 kg] 62 kg (10/19 1543)  GENERAL: asleep laying in bed in NAD.  HEENT: - Normocephalic and atraumatic, dry mm LUNGS - Clear to auscultation bilaterally with no wheezes CV - S1S2 RRR, no m/r/g, equal pulses bilaterally. ABDOMEN - Soft, nontender, nondistended with normoactive BS Ext: warm, well perfused, intact peripheral pulses, no edema   NEURO:  Mental Status: asleep, awakens easily. Mentation still appears altered, able to follow simple commands, can state his name, does perseverate stating his name to questions, unable to name objects, unable to tell me his age or current month. No dysarthria Cranial Nerves: PERRL. EOMI, visual fields full, no facial asymmetry,  facial sensation intact, hearing intact, tongue/uvula/soft palate midline, normal  sternocleidomastoid and trapezius muscle strength. No evidence of tongue atrophy or fibrillations Motor: all 4 extremities are antigravity and spontaneous movement  Tone: is normal and bulk is normal Sensation- Intact to light touch bilaterally Coordination:unable to assess  Gait- deferred  Medications  Current Facility-Administered Medications:    0.45 % sodium chloride infusion, , Intravenous, Continuous, Darlin Drop, DO, Last Rate: 40 mL/hr at 11/13/22 0908, Infusion Verify at 11/13/22 8295   acetaminophen (TYLENOL) tablet 650 mg, 650  mg, Oral, Q6H PRN, Dow Adolph N, DO   cefTRIAXone (ROCEPHIN) 2 g in sodium chloride 0.9 % 100 mL IVPB, 2 g, Intravenous, Q24H, Hall, Carole N, DO, Stopped at 11/13/22 0121   enoxaparin (LOVENOX) injection 40 mg, 40 mg, Subcutaneous, Q24H, Hall, Carole N, DO, 40 mg at 11/13/22 1006   LORazepam (ATIVAN) injection 2 mg, 2 mg, Intravenous, Q6H PRN, Hall, Carole N, DO   melatonin tablet 5 mg, 5 mg, Oral, QHS PRN, Hall, Carole N, DO   polyethylene glycol (MIRALAX / GLYCOLAX) packet 17 g, 17 g, Oral, Daily PRN, Margo Aye, Carole N, DO   prochlorperazine (COMPAZINE) injection 5 mg, 5 mg, Intravenous, Q6H PRN, Dow Adolph N, DO   valproate (DEPACON) 750 mg in dextrose 5 % 50 mL IVPB, 750 mg, Intravenous, BID, Erick Blinks, MD, Last Rate: 57.5 mL/hr at 11/13/22 1206, Restarted at 11/13/22 1206  Labs CBC    Component Value Date/Time   WBC 16.7 (H) 11/13/2022 0029   RBC 4.22 11/13/2022 0029   HGB 13.4 11/13/2022 0029   HGB 14.6 08/08/2022 0928   HCT 40.3 11/13/2022 0029   HCT 43.0 08/08/2022 0928   PLT 223 11/13/2022 0029   PLT 300 08/08/2022 0928   MCV 95.5 11/13/2022 0029   MCV 94 08/08/2022 0928   MCH 31.8 11/13/2022 0029   MCHC 33.3 11/13/2022 0029   RDW 13.2 11/13/2022 0029   RDW 12.9 08/08/2022 0928   LYMPHSABS 1.7 11/12/2022 1532   LYMPHSABS 1.5 08/08/2022 0928   MONOABS 0.4 11/12/2022 1532  EOSABS 0.0 11/12/2022 1532   EOSABS 0.1 08/08/2022 0928   BASOSABS 0.1 11/12/2022 1532   BASOSABS 0.0 08/08/2022 0928    CMP     Component Value Date/Time   NA 136 11/13/2022 0035   NA 140 07/22/2019 1502   K 3.5 11/13/2022 0035   CL 103 11/13/2022 0035   CO2 23 11/13/2022 0035   GLUCOSE 95 11/13/2022 0035   BUN 9 11/13/2022 0035   BUN 12 07/22/2019 1502   CREATININE 0.83 11/13/2022 0035   CREATININE 1.01 06/03/2022 1601   CALCIUM 7.8 (L) 11/13/2022 0035   PROT 7.4 11/12/2022 1532   PROT 7.2 07/22/2019 1502   ALBUMIN 4.0 11/12/2022 1532   ALBUMIN 4.9 07/22/2019 1502   AST 24  11/12/2022 1532   ALT 20 11/12/2022 1532   ALKPHOS 64 11/12/2022 1532   BILITOT 0.3 11/12/2022 1532   BILITOT <0.2 07/22/2019 1502   GFRNONAA >60 11/13/2022 0035   GFRAA 99 07/22/2019 1502    Lipid Panel     Component Value Date/Time   CHOL 199 06/03/2022 1601   TRIG 153 (H) 06/14/2022 0841   HDL 69 06/03/2022 1601   CHOLHDL 2.9 06/03/2022 1601   LDLCALC 113 (H) 06/03/2022 1601    Lab Results  Component Value Date   HGBA1C 5.5 08/22/2021     Imaging I have reviewed images in epic and the results pertinent to this consultation are:  CT-scan of the brain- no acute process   Labs  Ammonia 46 Blood cultures pending  VPA 57   Assessment:  32 y.o. male with known history of MS, seizures on Depakote and compliant with his medication more recently who presents with breakthrough seizure despite compliance with his Depakote. He has had at least 9 seizures in 24 hours.  Still appears altered Ammonia elevated VPA therapeutic. Initial consult recommended increase VPA to 750 but already has some hyperammonemia, so would not go up.  Impression: Seizures   Recommendations: - Seizure precautions  - LTM  - With ammonia elevated will decrease Depakote to 500 mg BID and start Lamictal. As Lamictal titrated up can start to taper of Depakote  -Due to him being on valproate, he will need to be started on the Lamictal blue starter pack which will be lamotrigine 25 mg oral every other day for 7 days, followed by 25 mg daily for 7 days followed by 50 mg daily for 7 days.  In the interim, he should be able to see the outpatient neurologist for further increment of the lamotrigine and tapering down of the Depakote. - Check UA and UDS - will need outpatient neurology follow up after discharge - Neurology will continue to follow  SEIZURE PRECAUTIONS Per [facility] statutes, patients with seizures are not allowed to drive until they have been seizure-free for six months.   Use  caution when using heavy equipment or power tools. Avoid working on ladders or at heights. Take showers instead of baths. Ensure the water temperature is not too high on the home water heater. Do not go swimming alone. Do not lock yourself in a room alone (i.e. bathroom). When caring for infants or small children, sit down when holding, feeding, or changing them to minimize risk of injury to the child in the event you have a seizure. Maintain good sleep hygiene. Avoid alcohol.    If patient has another seizure, call 911 and bring them back to the ED if: A.  The seizure lasts longer than 5 minutes.  B.  The patient doesn't wake shortly after the seizure or has new problems such as difficulty seeing, speaking or moving following the seizure C.  The patient was injured during the seizure D.  The patient has a temperature over 102 F (39C) E.  The patient vomited during the seizure and now is having trouble breathing   Gevena Mart DNP, ACNPC-AG  [facility]  Attending Neurohospitalist Addendum Patient seen and examined with APP/Resident. Agree with the history and physical as documented above. Agree with the plan as documented, which I helped formulate. I have independently reviewed the chart, obtained history, review of systems and examined the patient.I have personally reviewed pertinent head/neck/spine imaging (CT/MRI). Please feel free to call with any questions.  -- Milon Dikes, MD Neurologist [facility] Pager: [phone]

## 2022-11-13 NOTE — Progress Notes (Signed)
[facility] Progress Note  Patient: Miguel Black    EXB:[id number]  DOA: 11/12/2022     Date of Service: the patient was seen and examined on 11/13/2022  Chief Complaint  Patient presents with   Seizures   Brief hospital course: Miguel Black is a 32 y.o. male with medical history significant for MS, ambulatory dysfunction uses a cane, Keppra induced suicidal ideation, seizure disorder on Depakote and Ocrevus injection every 6 months, reportedly compliant with his medications, who presents to the ED via EMS due to breakthrough seizures.  The patient had 5 seizures at home today.  When EMS arrived he had another 2 seizures.  Received IM Versed by EMS.  Vomited en route.  After he arrived to the ED he had another 2 seizures.   In the ED, seen by neurology upon arrival, the patient received 2 doses of loading Keppra 1 g over 15 minutes each, and was started on IV Depakote 750 mg twice daily.  Neurology recommended admission for overnight EEG with video.     Obtunded after having 9 witnessed seizures.  However, responding to painful stimuli, and appears to be protecting his airway.     Critical care medicine consulted to assist with the management.  Admitted by [facility], hospitalist service.   ED Course: Temperature 98.9.  BP 114/73, pulse 90, respiratory rate 13, saturation 100% on room air.  Lab studies notable for serum potassium 5.4, glucose 123.  WBC 13.7, hemoglobin 13.3, platelet count 256.     Assessment and Plan:  Multiple breakthrough seizures x 9 Seizure disorder with compliance with home regimen Prior to admission on Depakote 500 mg twice daily and Ocrevus injection every 6 months Seen by neurology Seizure precautions are in place IV Ativan for breakthrough seizures Continuous EEG with video as recommended by neurology lactic acid level wnl 10/20 LTM eeg reviewed till 1440. No seizure, left frontal spikes.   S/p loading dose of Keppra given 2 g IV in the ED,  patient could not tolerate Keppra in the past, had suicidal ideations. Continue valproic acid 500 mg IV daily Follow neurology for further recommendation  Acute metabolic encephalopathy secondary to postictal state Treat underlying condition Reorient as needed Aspiration, seizures, and fall precautions are in place.   Hyperkalemia Potassium 5.4 Repeat potassium level and treat as indicated.   Leukocytosis, possibly reactive from seizures activity WBC on presentation 13.7 with neutrophil count of 11.4 Chest x-ray was nonacute Follow UA, and Ucx, blood culture Procalcitonin negative, Repeat CBC in the morning.   Possible aspiration with emesis en route via EMS Immunosuppressed on Ocrevus Continue Rocephin x 5 days Follow urine culture and peripheral blood cultures x 2 Monitor fever curve and WBCs Repeat CBC and obtain a baseline procalcitonin in the morning   MS with ambulatory dysfunction Uses a cane at baseline When more alert and stable, PT OT to evaluate with fall precautions    Body mass index is 19.06 kg/m.  Interventions:  Pressure Injury 06/13/22 Coccyx Stage 2 -  Partial thickness loss of dermis presenting as a shallow open injury with a red, pink wound bed without slough. (Active)  06/13/22 1700  Location: Coccyx  Location Orientation:   Staging: Stage 2 -  Partial thickness loss of dermis presenting as a shallow open injury with a red, pink wound bed without slough.  Wound Description (Comments):   Present on Admission: Yes  Dressing Type Foam - Lift dressing to assess site every shift 10/08/22 0817  Diet: Regular diet DVT Prophylaxis: Subcutaneous Lovenox   Advance goals of care discussion: Full code  Family Communication: family was not present at bedside, at the time of interview.  The pt provided permission to discuss medical plan with the family. Opportunity was given to ask question and all questions were answered satisfactorily.   Disposition:   Pt is from Home, admitted with seizure disorder and AMS, still has encephalopathy, which precludes a safe discharge. Discharge to home, when cleared by neurology.  Subjective: Patient was seen and examined at bedside.  Patient is AO x 1, awake and alert, denies any complaints, patient is not aware where is he right now and not aware of month and year.  Patient does not know what happened to him.  Physical Exam: General: NAD, lying comfortably Appear in no distress, affect appropriate Eyes: PERRLA ENT: Oral Mucosa Clear, moist  Neck: no JVD,  Cardiovascular: S1 and S2 Present, no Murmur,  Respiratory: good respiratory effort, Bilateral Air entry equal and Decreased, no Crackles, no wheezes Abdomen: Bowel Sound present, Soft and no tenderness,  Skin: no rashes Extremities: no Pedal edema, no calf tenderness Neurologic: without any new focal findings Gait not checked due to patient safety concerns  Vitals:   11/13/22 0800 11/13/22 0900 11/13/22 1000 11/13/22 1159  BP: 107/61 107/68 98/61 113/72  Pulse: 77 76 70 77  Resp: 14 17 12 20   Temp:    99.2 F (37.3 C)  TempSrc:    Oral  SpO2: 97% 98% 99% 100%  Weight:      Height:        Intake/Output Summary (Last 24 hours) at 11/13/2022 1453 Last data filed at 11/13/2022 0908 Gross per 24 hour  Intake 397.65 ml  Output --  Net 397.65 ml   Filed Weights   11/12/22 1543  Weight: 62 kg    Data Reviewed: I have personally reviewed and interpreted daily labs, tele strips, imagings as discussed above. I reviewed all nursing notes, pharmacy notes, vitals, pertinent old records I have discussed plan of care as described above with RN and patient/family.  CBC: Recent Labs  Lab 11/12/22 1532 11/13/22 0029  WBC 13.7* 16.7*  NEUTROABS 11.4*  --   HGB 15.3 13.4  HCT 47.6 40.3  MCV 97.3 95.5  PLT 256 223   Basic Metabolic Panel: Recent Labs  Lab 11/12/22 1532 11/13/22 0035  NA 140 136  K 5.4* 3.5  CL 105 103  CO2 23 23   GLUCOSE 123* 95  BUN 10 9  CREATININE 1.11 0.83  CALCIUM 9.0 7.8*  MG  --  1.9  PHOS  --  4.7*    Studies: DG Chest Port 1 View  Result Date: 11/12/2022 CLINICAL DATA:  Seizure EXAM: PORTABLE CHEST 1 VIEW COMPARISON:  10/08/2022 FINDINGS: The heart size and mediastinal contours are within normal limits. Small nodular density projecting over the right lung base, likely nipple shadow. Lungs are otherwise clear. No pleural effusion or pneumothorax. The visualized skeletal structures are unremarkable. IMPRESSION: 1. No acute cardiopulmonary findings. 2. Small nodular density projecting over the right lung base, likely nipple shadow. Repeat radiograph with nipple markers could be performed to confirm. Electronically Signed   By: Duanne Guess D.O.   On: 11/12/2022 17:57   CT Head Wo Contrast  Result Date: 11/12/2022 CLINICAL DATA:  Seizure disorder, clinical change EXAM: CT HEAD WITHOUT CONTRAST TECHNIQUE: Contiguous axial images were obtained from the base of the skull through the vertex without intravenous contrast.  RADIATION DOSE REDUCTION: This exam was performed according to the departmental dose-optimization program which includes automated exposure control, adjustment of the mA and/or kV according to patient size and/or use of iterative reconstruction technique. COMPARISON:  Head CT 10/06/2022 FINDINGS: Brain: No evidence of acute infarction, hemorrhage, hydrocephalus, extra-axial collection or mass lesion/mass effect. Low densities in the cerebral white matter related to known diagnosis of multiple sclerosis, unchanged in CT appearance. Vascular: No hyperdense vessel or unexpected calcification. Skull: No fracture or focal lesion. Sinuses/Orbits: No acute finding. Other: None. IMPRESSION: 1. No acute intracranial abnormality. 2. Low densities in the cerebral white matter related to known diagnosis of multiple sclerosis, unchanged in CT appearance. Electronically Signed   By: Narda Rutherford  M.D.   On: 11/12/2022 17:54    Scheduled Meds:  enoxaparin (LOVENOX) injection  40 mg Subcutaneous Q24H   Continuous Infusions:  sodium chloride 40 mL/hr at 11/13/22 0908   cefTRIAXone (ROCEPHIN)  IV Stopped (11/13/22 0121)   valproate sodium     PRN Meds: acetaminophen, LORazepam, melatonin, polyethylene glycol, prochlorperazine  Time spent: 35 minutes  Author: Gillis Santa. MD [facility] 11/13/2022 2:53 PM  To reach On-call, see care teams to locate the attending and reach out to them via [email or web address]. If 7PM-7AM, please contact night-coverage If you still have difficulty reaching the attending provider, please page the [facility] (Director on Call) for [facility] on amion for assistance.

## 2022-11-13 NOTE — Progress Notes (Signed)
Patient moving to 3W. EEG to be placed once patient reaches floor.

## 2022-11-13 NOTE — ED Notes (Signed)
ED TO INPATIENT HANDOFF REPORT  ED Nurse Name and Phone #: [phone]  S Name/Age/Gender Miguel Black 32 y.o. male Room/Bed: 029C/029C  Code Status   Code Status: Full Code  Home/SNF/Other Home Patient is disoriented x 4 Is this baseline? No   Triage Complete: Triage complete  Chief Complaint Seizures (HCC) [R56.9]  Triage Note Pt BIBGEMS from home after having seizures. Per mother he has several partial seizures then 5 full body seizures then 2 with ems and given 5 mg versed IM then 2 more full body seizures. Hypoventilate, ETCO2 60, bagged back up, NPA in place. Emesis en route. Pt on nonrebreathing upon arrival.   12 minutes no seizure   120/70 CBG 120s  Keppra - SI switched meds for 1 month  Hx seizures usually focal, MS uses cane    Allergies Allergies  Allergen Reactions   Dalfampridine Other (See Comments)    Seizure 02/2021 while on dalfampridine    Level of Care/Admitting Diagnosis ED Disposition     ED Disposition  Admit   Condition  --   Comment  Hospital Area: [facility] [[id number]]  Level of Care: Progressive [102]  Admit to Progressive based on following criteria: NEUROLOGICAL AND NEUROSURGICAL complex patients with significant risk of instability, who do not meet ICU criteria, yet require close observation or frequent assessment (< / = every 2 - 4 hours) with medical / nursing intervention.  May admit patient to [facility] or [facility] if equivalent level of care is available:: No  Covid Evaluation: Asymptomatic - no recent exposure (last 10 days) testing not required  Diagnosis: Seizures [facility]) [[id number]]  Admitting Physician: Darlin Drop [[id number]]  Attending Physician: Darlin Drop [[id number]]  Certification:: I certify this patient will need inpatient services for at least 2 midnights  Expected Medical Readiness: 11/14/2022          B Medical/Surgery History Past Medical History:  Diagnosis Date   Acute  encephalopathy 04/26/2022   Acute metabolic encephalopathy 04/26/2022   Acute respiratory failure with hypoxia (HCC) 08/23/2021   Aspiration pneumonia (HCC) 07/12/2018   Closed fracture of distal phalanx or phalanges of hand 02/05/2009   Formatting of this note might be different from the original. Overview: The original diagnosis was replaced automatically by the ICD-10 team to an ICD-10 compliant one. Please review for accuracy   Encounter for screening for HIV 07/12/2018   Foot drop 06/03/2022   Has a left foot drop ever since 2015 first flare of multiple sclerosis had a brace in 2022 custom but does not actually do anything to lift the from the foot physical therapist advised him to get an alternative foot drop brace but I cannot find record of that   Marijuana abuse 05/01/2013   MS (multiple sclerosis) (HCC)    Seizure (HCC)    Seizures (HCC) 01/22/2019   Status epilepticus (HCC) 07/12/2018   Past Surgical History:  Procedure Laterality Date   NO PAST SURGERIES       A IV Location/Drains/Wounds Patient Lines/Drains/Airways Status     Active Line/Drains/Airways     Name Placement date Placement time Site Days   Peripheral IV 11/12/22 20 G Anterior;Left Forearm 11/12/22  --  Forearm  1   Peripheral IV 11/12/22 18 G 1" Right Antecubital 11/12/22  --  Antecubital  1   Peripheral IV 11/13/22 20 G Posterior;Right Forearm 11/13/22  0254  Forearm  less than 1   Peripheral IV 11/13/22 20 G Left;Posterior Forearm  11/13/22  0254  Forearm  less than 1   Pressure Injury 06/13/22 Coccyx Stage 2 -  Partial thickness loss of dermis presenting as a shallow open injury with a red, pink wound bed without slough. 06/13/22  1700  -- 153            Intake/Output Last 24 hours  Intake/Output Summary (Last 24 hours) at 11/13/2022 0925 Last data filed at 11/13/2022 0908 Gross per 24 hour  Intake 397.65 ml  Output --  Net 397.65 ml    Labs/Imaging Results for orders placed or performed  during the hospital encounter of 11/12/22 (from the past 48 hour(s))  CBC with Differential     Status: Abnormal   Collection Time: 11/12/22  3:32 PM  Result Value Ref Range   WBC 13.7 (H) 4.0 - 10.5 K/uL   RBC 4.89 4.22 - 5.81 MIL/uL   Hemoglobin 15.3 13.0 - 17.0 g/dL   HCT 16.1 09.6 - 04.5 %   MCV 97.3 80.0 - 100.0 fL   MCH 31.3 26.0 - 34.0 pg   MCHC 32.1 30.0 - 36.0 g/dL   RDW 40.9 81.1 - 91.4 %   Platelets 256 150 - 400 K/uL   nRBC 0.0 0.0 - 0.2 %   Neutrophils Relative % 84 %   Neutro Abs 11.4 (H) 1.7 - 7.7 K/uL   Lymphocytes Relative 12 %   Lymphs Abs 1.7 0.7 - 4.0 K/uL   Monocytes Relative 3 %   Monocytes Absolute 0.4 0.1 - 1.0 K/uL   Eosinophils Relative 0 %   Eosinophils Absolute 0.0 0.0 - 0.5 K/uL   Basophils Relative 0 %   Basophils Absolute 0.1 0.0 - 0.1 K/uL   Immature Granulocytes 1 %   Abs Immature Granulocytes 0.13 (H) 0.00 - 0.07 K/uL    Comment: Performed at [facility], [address], [place], [place] [address]  Comprehensive metabolic panel     Status: Abnormal   Collection Time: 11/12/22  3:32 PM  Result Value Ref Range   Sodium 140 135 - 145 mmol/L   Potassium 5.4 (H) 3.5 - 5.1 mmol/L   Chloride 105 98 - 111 mmol/L   CO2 23 22 - 32 mmol/L   Glucose, Bld 123 (H) 70 - 99 mg/dL    Comment: Glucose reference range applies only to samples taken after fasting for at least 8 hours.   BUN 10 6 - 20 mg/dL   Creatinine, Ser 6.21 0.61 - 1.24 mg/dL   Calcium 9.0 8.9 - 30.8 mg/dL   Total Protein 7.4 6.5 - 8.1 g/dL   Albumin 4.0 3.5 - 5.0 g/dL   AST 24 15 - 41 U/L   ALT 20 0 - 44 U/L   Alkaline Phosphatase 64 38 - 126 U/L   Total Bilirubin 0.3 0.3 - 1.2 mg/dL   GFR, Estimated >65 >78 mL/min    Comment: (NOTE) Calculated using the CKD-EPI Creatinine Equation (2021)    Anion gap 12 5 - 15    Comment: Performed at [facility], [address], [place], [place] [address]  Ethanol     Status: None   Collection Time: 11/12/22  3:32 PM  Result Value  Ref Range   Alcohol, Ethyl (B) <10 <10 mg/dL    Comment: (NOTE) Lowest detectable limit for serum alcohol is 10 mg/dL.  For medical purposes only. Performed at [facility], [address], [place], [place] [address]   Valproic acid level  Status: None   Collection Time: 11/12/22  3:32 PM  Result Value Ref Range   Valproic Acid Lvl 57 50.0 - 100.0 ug/mL    Comment: Performed at [facility], [address], [place], [place] [address]  CBC     Status: Abnormal   Collection Time: 11/13/22 12:29 AM  Result Value Ref Range   WBC 16.7 (H) 4.0 - 10.5 K/uL   RBC 4.22 4.22 - 5.81 MIL/uL   Hemoglobin 13.4 13.0 - 17.0 g/dL   HCT 96.2 95.2 - 84.1 %   MCV 95.5 80.0 - 100.0 fL   MCH 31.8 26.0 - 34.0 pg   MCHC 33.3 30.0 - 36.0 g/dL   RDW 32.4 40.1 - 02.7 %   Platelets 223 150 - 400 K/uL   nRBC 0.0 0.0 - 0.2 %    Comment: Performed at [facility], [address], [place], [place] [address]  Ammonia     Status: Abnormal   Collection Time: 11/13/22 12:35 AM  Result Value Ref Range   Ammonia 46 (H) 9 - 35 umol/L    Comment: Performed at [facility], [address], [place], [place] [address]  Basic metabolic panel     Status: Abnormal   Collection Time: 11/13/22 12:35 AM  Result Value Ref Range   Sodium 136 135 - 145 mmol/L   Potassium 3.5 3.5 - 5.1 mmol/L   Chloride 103 98 - 111 mmol/L   CO2 23 22 - 32 mmol/L   Glucose, Bld 95 70 - 99 mg/dL    Comment: Glucose reference range applies only to samples taken after fasting for at least 8 hours.   BUN 9 6 - 20 mg/dL   Creatinine, Ser 7.42 0.61 - 1.24 mg/dL   Calcium 7.8 (L) 8.9 - 10.3 mg/dL   GFR, Estimated >59 >56 mL/min    Comment: (NOTE) Calculated using the CKD-EPI Creatinine Equation (2021)    Anion gap 10 5 - 15    Comment: Performed at [facility], [address] [place], [place] [address]  Lactic acid, plasma     Status: None   Collection Time: 11/13/22 12:35 AM  Result Value Ref Range    Lactic Acid, Venous 1.6 0.5 - 1.9 mmol/L    Comment: Performed at [facility], [address], [place], [place] [address]  Magnesium     Status: None   Collection Time: 11/13/22 12:35 AM  Result Value Ref Range   Magnesium 1.9 1.7 - 2.4 mg/dL    Comment: Performed at [facility], [address] [place], [place] [address]  Procalcitonin     Status: None   Collection Time: 11/13/22 12:35 AM  Result Value Ref Range   Procalcitonin <0.10 ng/mL    Comment:        Interpretation: PCT (Procalcitonin) <= 0.5 ng/mL: Systemic infection (sepsis) is not likely. Local bacterial infection is possible. (NOTE)       Sepsis PCT Algorithm           Lower Respiratory Tract                                      Infection PCT Algorithm    ----------------------------     ----------------------------         PCT < 0.25 ng/mL  PCT < 0.10 ng/mL          Strongly encourage             Strongly discourage   discontinuation of antibiotics    initiation of antibiotics    ----------------------------     -----------------------------       PCT 0.25 - 0.50 ng/mL            PCT 0.10 - 0.25 ng/mL               OR       >80% decrease in PCT            Discourage initiation of                                            antibiotics      Encourage discontinuation           of antibiotics    ----------------------------     -----------------------------         PCT >= 0.50 ng/mL              PCT 0.26 - 0.50 ng/mL               AND        <80% decrease in PCT             Encourage initiation of                                             antibiotics       Encourage continuation           of antibiotics    ----------------------------     -----------------------------        PCT >= 0.50 ng/mL                  PCT > 0.50 ng/mL               AND         increase in PCT                  Strongly encourage                                      initiation of antibiotics    Strongly  encourage escalation           of antibiotics                                     -----------------------------                                           PCT <= 0.25 ng/mL                                                 OR                                        >  80% decrease in PCT                                      Discontinue / Do not initiate                                             antibiotics  Performed at [facility], [address], [place], [place] [address]   Phosphorus     Status: Abnormal   Collection Time: 11/13/22 12:35 AM  Result Value Ref Range   Phosphorus 4.7 (H) 2.5 - 4.6 mg/dL    Comment: Performed at [facility], [address] [place], [place] [address]   DG Chest Port 1 View  Result Date: 11/12/2022 CLINICAL DATA:  Seizure EXAM: PORTABLE CHEST 1 VIEW COMPARISON:  10/08/2022 FINDINGS: The heart size and mediastinal contours are within normal limits. Small nodular density projecting over the right lung base, likely nipple shadow. Lungs are otherwise clear. No pleural effusion or pneumothorax. The visualized skeletal structures are unremarkable. IMPRESSION: 1. No acute cardiopulmonary findings. 2. Small nodular density projecting over the right lung base, likely nipple shadow. Repeat radiograph with nipple markers could be performed to confirm. Electronically Signed   By: Duanne Guess D.O.   On: 11/12/2022 17:57   CT Head Wo Contrast  Result Date: 11/12/2022 CLINICAL DATA:  Seizure disorder, clinical change EXAM: CT HEAD WITHOUT CONTRAST TECHNIQUE: Contiguous axial images were obtained from the base of the skull through the vertex without intravenous contrast. RADIATION DOSE REDUCTION: This exam was performed according to the departmental dose-optimization program which includes automated exposure control, adjustment of the mA and/or kV according to patient size and/or use of iterative reconstruction technique. COMPARISON:  Head CT 10/06/2022  FINDINGS: Brain: No evidence of acute infarction, hemorrhage, hydrocephalus, extra-axial collection or mass lesion/mass effect. Low densities in the cerebral white matter related to known diagnosis of multiple sclerosis, unchanged in CT appearance. Vascular: No hyperdense vessel or unexpected calcification. Skull: No fracture or focal lesion. Sinuses/Orbits: No acute finding. Other: None. IMPRESSION: 1. No acute intracranial abnormality. 2. Low densities in the cerebral white matter related to known diagnosis of multiple sclerosis, unchanged in CT appearance. Electronically Signed   By: Narda Rutherford M.D.   On: 11/12/2022 17:54    Pending Labs Unresulted Labs (From admission, onward)     Start     Ordered   11/19/22 0500  Creatinine, serum  (enoxaparin (LOVENOX)    CrCl >/= 30 ml/min)  Weekly,   R     Comments: while on enoxaparin therapy    11/12/22 2240   11/12/22 2325  Culture, blood (Routine X 2) w Reflex to ID Panel  BLOOD CULTURE X 2,   R (with TIMED occurrences)     Question:  Patient immune status  Answer:  Immunocompromised   11/12/22 2324   11/12/22 2322  Urine Culture (for pregnant, neutropenic or urologic patients or patients with an indwelling urinary catheter)  (Urine Labs)  ONCE - URGENT,   URGENT       Question:  Indication  Answer:  Altered mental status (if no other cause identified)   11/12/22 2321   11/12/22 2302  Lactic acid, plasma  (Lactic Acid)  STAT Now then every 3 hours,  R (with STAT occurrences)      11/12/22 2301   11/12/22 1536  Urinalysis, Routine w reflex microscopic -Urine, Clean Catch  Once,   URGENT       Question Answer Comment  Specimen Source Urine, Clean Catch   Release to patient Immediate      11/12/22 1537   11/12/22 1536  Rapid urine drug screen (hospital performed)  ONCE - STAT,   STAT       Question:  Release to patient  Answer:  Immediate   11/12/22 1537            Vitals/Pain Today's Vitals   11/13/22 0700 11/13/22 0730 11/13/22  0800 11/13/22 0900  BP: (!) 98/58 106/65 107/61 107/68  Pulse: 77 76 77 76  Resp: 15 15 14 17   Temp:      TempSrc:      SpO2: 98% 96% 97% 98%  Weight:      Height:        Isolation Precautions No active isolations  Medications Medications  valproate (DEPACON) 750 mg in dextrose 5 % 50 mL IVPB (0 mg Intravenous Stopped 11/13/22 0032)  enoxaparin (LOVENOX) injection 40 mg (has no administration in time range)  acetaminophen (TYLENOL) tablet 650 mg (has no administration in time range)  prochlorperazine (COMPAZINE) injection 5 mg (has no administration in time range)  polyethylene glycol (MIRALAX / GLYCOLAX) packet 17 g (has no administration in time range)  melatonin tablet 5 mg (has no administration in time range)  LORazepam (ATIVAN) injection 2 mg (has no administration in time range)  0.45 % sodium chloride infusion ( Intravenous Infusion Verify 11/13/22 0908)  cefTRIAXone (ROCEPHIN) 2 g in sodium chloride 0.9 % 100 mL IVPB (0 g Intravenous Stopped 11/13/22 0121)  LORazepam (ATIVAN) 2 MG/ML injection (2 mg  Given 11/12/22 1519)  levETIRAcetam (KEPPRA) IVPB 1000 mg/100 mL premix (0 mg Intravenous Stopped 11/12/22 1847)    And  levETIRAcetam (KEPPRA) IVPB 1000 mg/100 mL premix (0 mg Intravenous Stopped 11/12/22 1847)  sodium chloride 0.9 % bolus 500 mL (0 mLs Intravenous Stopped 11/13/22 0241)    Mobility Walks at baseline with a cane, has not walked here     Focused Assessments Neuro Assessment Handoff:  Swallow screen pass? No  Cardiac Rhythm: Normal sinus rhythm       Neuro Assessment: Exceptions to WDL       R Recommendations: See Admitting Provider Note  Report given to:   Additional Notes:

## 2022-11-13 NOTE — Progress Notes (Incomplete)
PHARMACY - PHYSICIAN COMMUNICATION CRITICAL VALUE ALERT - BLOOD CULTURE IDENTIFICATION (BCID)  Miguel Black is an 32 y.o. male who presented to [facility] on 11/12/2022 with a chief complaint of ***  Assessment:  *** (include suspected source if known)  Name of physician (or Provider) Contacted: ***  Current antibiotics: ***  Changes to prescribed antibiotics recommended:  {Plan:21268}  No results found for this or any previous visit.  Mosetta Anis 11/13/2022  10:32 PM

## 2022-11-13 NOTE — Progress Notes (Signed)
Received pt on unit. Got pt oriented to the room with bed locked at lowest position call bell within reach

## 2022-11-13 NOTE — Progress Notes (Signed)
PT Cancellation Note  Patient Details Name: Miguel Black MRN: [id number] DOB: Jan 16, 1991   Cancelled Treatment:    Reason Eval/Treat Not Completed: Medical issues which prohibited therapy - pt now on LTM EEG, sleeping. PT to check back at a later time.   Marye Round, PT DPT Acute Rehabilitation Services Secure Chat Preferred  Office [phone]    Truddie Coco 11/13/2022, 2:18 PM

## 2022-11-13 NOTE — Care Plan (Signed)
LTM eeg reviewed till 1440. No seizure, left frontal spikes. Please review final report for details.  Miguel Black [name]

## 2022-11-13 NOTE — Progress Notes (Signed)
LTM EEG hooked up and running - no initial skin breakdown - push button tested - [facility] monitoring.  

## 2022-11-13 NOTE — Progress Notes (Signed)
PHARMACY - PHYSICIAN COMMUNICATION CRITICAL VALUE ALERT - BLOOD CULTURE IDENTIFICATION (BCID)  Miguel Black is an 32 y.o. male who presented to [facility] on 11/12/2022 with a chief complaint of seizures   Name of physician (or Provider) Contacted: Opyd  Current antibiotics: ceftriaxone  Changes to prescribed antibiotics recommended:  Patient is on recommended antibiotics - No changes needed  Results for orders placed or performed during the hospital encounter of 11/12/22  Blood Culture ID Panel (Reflexed) (Collected: 11/13/2022 12:29 AM)  Result Value Ref Range   Enterococcus faecalis NOT DETECTED NOT DETECTED   Enterococcus Faecium NOT DETECTED NOT DETECTED   Listeria monocytogenes NOT DETECTED NOT DETECTED   Staphylococcus species DETECTED (A) NOT DETECTED   Staphylococcus aureus (BCID) NOT DETECTED NOT DETECTED   Staphylococcus epidermidis NOT DETECTED NOT DETECTED   Staphylococcus lugdunensis NOT DETECTED NOT DETECTED   Streptococcus species NOT DETECTED NOT DETECTED   Streptococcus agalactiae NOT DETECTED NOT DETECTED   Streptococcus pneumoniae NOT DETECTED NOT DETECTED   Streptococcus pyogenes NOT DETECTED NOT DETECTED   A.calcoaceticus-baumannii NOT DETECTED NOT DETECTED   Bacteroides fragilis NOT DETECTED NOT DETECTED   Enterobacterales NOT DETECTED NOT DETECTED   Enterobacter cloacae complex NOT DETECTED NOT DETECTED   Escherichia coli NOT DETECTED NOT DETECTED   Klebsiella aerogenes NOT DETECTED NOT DETECTED   Klebsiella oxytoca NOT DETECTED NOT DETECTED   Klebsiella pneumoniae NOT DETECTED NOT DETECTED   Proteus species NOT DETECTED NOT DETECTED   Salmonella species NOT DETECTED NOT DETECTED   Serratia marcescens NOT DETECTED NOT DETECTED   Haemophilus influenzae NOT DETECTED NOT DETECTED   Neisseria meningitidis NOT DETECTED NOT DETECTED   Pseudomonas aeruginosa NOT DETECTED NOT DETECTED   Stenotrophomonas maltophilia NOT DETECTED NOT DETECTED   Candida  albicans NOT DETECTED NOT DETECTED   Candida auris NOT DETECTED NOT DETECTED   Candida glabrata NOT DETECTED NOT DETECTED   Candida krusei NOT DETECTED NOT DETECTED   Candida parapsilosis NOT DETECTED NOT DETECTED   Candida tropicalis NOT DETECTED NOT DETECTED   Cryptococcus neoformans/gattii NOT DETECTED NOT DETECTED    Mosetta Anis 11/13/2022  10:32 PM

## 2022-11-14 ENCOUNTER — Inpatient Hospital Stay (HOSPITAL_COMMUNITY): Payer: Medicare (Managed Care)

## 2022-11-14 DIAGNOSIS — G9340 Encephalopathy, unspecified: Secondary | ICD-10-CM

## 2022-11-14 DIAGNOSIS — R4182 Altered mental status, unspecified: Secondary | ICD-10-CM

## 2022-11-14 DIAGNOSIS — G40901 Epilepsy, unspecified, not intractable, with status epilepticus: Principal | ICD-10-CM

## 2022-11-14 DIAGNOSIS — R569 Unspecified convulsions: Secondary | ICD-10-CM | POA: Diagnosis not present

## 2022-11-14 DIAGNOSIS — G40909 Epilepsy, unspecified, not intractable, without status epilepticus: Secondary | ICD-10-CM

## 2022-11-14 LAB — URINALYSIS, ROUTINE W REFLEX MICROSCOPIC
Bilirubin Urine: NEGATIVE
Glucose, UA: NEGATIVE mg/dL
Hgb urine dipstick: NEGATIVE
Ketones, ur: 5 mg/dL — AB
Leukocytes,Ua: NEGATIVE
Nitrite: NEGATIVE
Protein, ur: NEGATIVE mg/dL
Specific Gravity, Urine: 1.033 — ABNORMAL HIGH (ref 1.005–1.030)
pH: 7 (ref 5.0–8.0)

## 2022-11-14 LAB — BASIC METABOLIC PANEL
Anion gap: 10 (ref 5–15)
BUN: 14 mg/dL (ref 6–20)
CO2: 21 mmol/L — ABNORMAL LOW (ref 22–32)
Calcium: 8.2 mg/dL — ABNORMAL LOW (ref 8.9–10.3)
Chloride: 110 mmol/L (ref 98–111)
Creatinine, Ser: 0.95 mg/dL (ref 0.61–1.24)
GFR, Estimated: >60 mL/min (ref >60)
Glucose, Bld: 94 mg/dL (ref 70–99)
Potassium: 4.1 mmol/L (ref 3.5–5.1)
Sodium: 141 mmol/L (ref 135–145)

## 2022-11-14 LAB — MAGNESIUM: Magnesium: 2 mg/dL (ref 1.7–2.4)

## 2022-11-14 LAB — CBC
HCT: 42 % (ref 39.0–52.0)
Hemoglobin: 13.7 g/dL (ref 13.0–17.0)
MCH: 31.5 pg (ref 26.0–34.0)
MCHC: 32.6 g/dL (ref 30.0–36.0)
MCV: 96.6 fL (ref 80.0–100.0)
Platelets: 197 10*3/uL (ref 150–400)
RBC: 4.35 MIL/uL (ref 4.22–5.81)
RDW: 13.2 % (ref 11.5–15.5)
WBC: 7.3 10*3/uL (ref 4.0–10.5)
nRBC: 0 % (ref 0.0–0.2)

## 2022-11-14 LAB — RAPID URINE DRUG SCREEN, HOSP PERFORMED
Amphetamines: NOT DETECTED
Barbiturates: NOT DETECTED
Benzodiazepines: POSITIVE — AB
Cocaine: NOT DETECTED
Opiates: NOT DETECTED
Tetrahydrocannabinol: POSITIVE — AB

## 2022-11-14 LAB — PHOSPHORUS: Phosphorus: 3.5 mg/dL (ref 2.5–4.6)

## 2022-11-14 MED ORDER — DIVALPROEX SODIUM 250 MG PO DR TAB
500.0000 mg | DELAYED_RELEASE_TABLET | Freq: Two times a day (BID) | ORAL | Status: DC
  Administered 2022-11-14 – 2022-11-15 (×2): 500 mg via ORAL
  Filled 2022-11-14 (×2): qty 2

## 2022-11-14 MED ORDER — GADOBUTROL 1 MMOL/ML IV SOLN
6.0000 mL | Freq: Once | INTRAVENOUS | Status: AC | PRN
  Administered 2022-11-14: 6 mL via INTRAVENOUS

## 2022-11-14 MED ORDER — LAMOTRIGINE 25 MG PO TABS: 25.0000 mg | ORAL_TABLET | Freq: Every day | ORAL | Status: DC

## 2022-11-14 MED ORDER — LAMOTRIGINE ER 50 MG PO TB24: 50.0000 mg | ORAL_TABLET | Freq: Every day | ORAL | Status: DC

## 2022-11-14 MED ORDER — LAMOTRIGINE 25 MG PO TABS: ORAL_TABLET | ORAL | 0 refills | Status: DC

## 2022-11-14 MED ORDER — LAMOTRIGINE 25 MG PO TABS
25.0000 mg | ORAL_TABLET | ORAL | Status: DC
  Administered 2022-11-15: 25 mg via ORAL
  Filled 2022-11-14: qty 1

## 2022-11-14 NOTE — Progress Notes (Signed)
[facility] Progress Note  Patient: Miguel Black    QMV:[id number]  DOA: 11/12/2022     Date of Service: the patient was seen and examined on 11/14/2022  Chief Complaint  Patient presents with   Seizures   Brief hospital course: Miguel Black is a 32 y.o. male with medical history significant for MS, ambulatory dysfunction uses a cane, Keppra induced suicidal ideation, seizure disorder on Depakote and Ocrevus injection every 6 months, reportedly compliant with his medications, who presents to the ED via EMS due to breakthrough seizures.  The patient had 5 seizures at home today.  When EMS arrived he had another 2 seizures.  Received IM Versed by EMS.  Vomited en route.  After he arrived to the ED he had another 2 seizures.   In the ED, seen by neurology upon arrival, the patient received 2 doses of loading Keppra 1 g over 15 minutes each, and was started on IV Depakote 750 mg twice daily.  Neurology recommended admission for overnight EEG with video.     Obtunded after having 9 witnessed seizures.  However, responding to painful stimuli, and appears to be protecting his airway.     Critical care medicine consulted to assist with the management.  Admitted by [facility], hospitalist service.   ED Course: Temperature 98.9.  BP 114/73, pulse 90, respiratory rate 13, saturation 100% on room air.  Lab studies notable for serum potassium 5.4, glucose 123.  WBC 13.7, hemoglobin 13.3, platelet count 256.     Assessment and Plan:  Multiple breakthrough seizures x 9 Seizure disorder with compliance with home regimen Prior to admission on Depakote 500 mg twice daily and Ocrevus injection every 6 months Seen by neurology Seizure precautions are in place IV Ativan for breakthrough seizures Continuous EEG with video as recommended by neurology lactic acid level wnl 10/20 LTM eeg reviewed till 1440. No seizure, left frontal spikes.   S/p loading dose of Keppra given 2 g IV in the ED,  patient could not tolerate Keppra in the past, had suicidal ideations. Continue valproic acid 500 mg IV daily 10/21 started Lamictal 25 mg p.o. every other day for 12 days, and than 25 mg daily for 14 days followed by 50 mg p.o. daily for 7 days. Neurology recommended MRI brain with contrast and if negative then discharge home, follow-up as an outpatient. Follow neurology for further recommendation  Acute metabolic encephalopathy secondary to postictal state Treat underlying condition Reorient as needed Aspiration, seizures, and fall precautions are in place.   Hyperkalemia Potassium 5.4--4.1 resolved   Leukocytosis, Resolved, possibly reactive from seizures activity WBC on presentation 13.7 with neutrophil count of 11.4 Chest x-ray was nonacute Follow UA, and Ucx, blood culture Procalcitonin negative   Possible aspiration with emesis en route via EMS Immunosuppressed on Ocrevus Continue Rocephin for now Urine culture pending Blood culture 1/4 growing Staph hominis, most likely contamination   MS with ambulatory dysfunction Uses a cane at baseline When more alert and stable, PT OT to evaluate with fall precautions Follow MRI brain with contrast as per neurology    Body mass index is 19.06 kg/m.  Interventions:  Pressure Injury 06/13/22 Coccyx Stage 2 -  Partial thickness loss of dermis presenting as a shallow open injury with a red, pink wound bed without slough. (Active)  06/13/22 1700  Location: Coccyx  Location Orientation:   Staging: Stage 2 -  Partial thickness loss of dermis presenting as a shallow open injury with a red, pink  wound bed without slough.  Wound Description (Comments):   Present on Admission: Yes  Dressing Type Foam - Lift dressing to assess site every shift 11/14/22 0800     Diet: Regular diet DVT Prophylaxis: Subcutaneous Lovenox   Advance goals of care discussion: Full code  Family Communication: family was not present at bedside, at the time  of interview.  The pt provided permission to discuss medical plan with the family. Opportunity was given to ask question and all questions were answered satisfactorily.  10/21 discussed with patient's mother over the phone.  Disposition:  Pt is from Home, admitted with seizure disorder and AMS, still has encephalopathy, which precludes a safe discharge. Discharge to home, after MRI brain, patient is cleared by neurology if MRI negative.  Subjective: Patient was seen and examined at bedside.  Patient is AAO x 4 today.  Denied any complaints, no headache or dizziness.  Patient would like to go home today.   Physical Exam: General: NAD, lying comfortably Appear in no distress, affect appropriate Eyes: PERRLA ENT: Oral Mucosa Clear, moist  Neck: no JVD,  Cardiovascular: S1 and S2 Present, no Murmur,  Respiratory: good respiratory effort, Bilateral Air entry equal and Decreased, no Crackles, no wheezes Abdomen: Bowel Sound present, Soft and no tenderness,  Skin: no rashes Extremities: no Pedal edema, no calf tenderness Neurologic: without any new focal findings Gait not checked due to patient safety concerns  Vitals:   11/14/22 0024 11/14/22 0455 11/14/22 1320 11/14/22 1614  BP: 113/66 110/66 132/87 120/81  Pulse: 76 66 78 74  Resp: 16 14 18 16   Temp: 98.9 F (37.2 C) 98 F (36.7 C) 99.6 F (37.6 C) 98.9 F (37.2 C)  TempSrc: Oral Oral Oral Oral  SpO2: 97% 98% 100% 99%  Weight:      Height:        Intake/Output Summary (Last 24 hours) at 11/14/2022 1712 Last data filed at 11/14/2022 0500 Gross per 24 hour  Intake 252.99 ml  Output --  Net 252.99 ml   Filed Weights   11/12/22 1543  Weight: 62 kg    Data Reviewed: I have personally reviewed and interpreted daily labs, tele strips, imagings as discussed above. I reviewed all nursing notes, pharmacy notes, vitals, pertinent old records I have discussed plan of care as described above with RN and  patient/family.  CBC: Recent Labs  Lab 11/12/22 1532 11/13/22 0029 11/14/22 0742  WBC 13.7* 16.7* 7.3  NEUTROABS 11.4*  --   --   HGB 15.3 13.4 13.7  HCT 47.6 40.3 42.0  MCV 97.3 95.5 96.6  PLT 256 223 197   Basic Metabolic Panel: Recent Labs  Lab 11/12/22 1532 11/13/22 0035 11/14/22 0742  NA 140 136 141  K 5.4* 3.5 4.1  CL 105 103 110  CO2 23 23 21*  GLUCOSE 123* 95 94  BUN 10 9 14   CREATININE 1.11 0.83 0.95  CALCIUM 9.0 7.8* 8.2*  MG  --  1.9 2.0  PHOS  --  4.7* 3.5    Studies: Overnight EEG with video  Result Date: 11/14/2022 Charlsie Quest, MD     11/14/2022 10:16 AM Patient Name: Miguel Black MRN: [id number] Epilepsy Attending: Charlsie Quest Referring Physician/Provider: Erick Blinks, MD Duration: 10/14/2022 1224 to 10/15/2022 1015  Patient history:  32 y.o. male with PMH significant for RRMS, epilepsy with medication noncompliance who presents with seizure at home, got 7.5mg  of Versed per EMS. Now appears post ictal and sedated. Got  loaded with Keppra 1.5g and since then, has not had any clinical seizures. Concern for aphasia out of proportion to encephalopathy. EEG to evaluate for seizure  Level of alertness: Awake, asleep  AEDs during EEG study: LTG, VPA  Technical aspects: This EEG study was done with scalp electrodes positioned according to the 10-20 International system of electrode placement. Electrical activity was reviewed with band pass filter of 1-70Hz , sensitivity of 7 uV/mm, display speed of 5mm/sec with a 60Hz  notched filter applied as appropriate. EEG data were recorded continuously and digitally stored.  Video monitoring was available and reviewed as appropriate.  Description: The posterior dominant rhythm consists of 8-9 Hz activity of moderate voltage (25-35 uV) seen predominantly in posterior head regions, symmetric and reactive to eye opening and eye closing. Sleep was characterized by vertex waves, sleep spindles (12 to 14 Hz), maximal  frontocentral region. EEG showed intermittent generalized polymorphic 3  to 5 Hz theta-delta slowing. Frequent sharp waves were noted in LEFt fronto-temporal region. Hyperventilation and photic stimulation were not performed.   Of note, parts of study were difficult to interpret due to significant electrode artifact.  ABNORMALITY -Sharp wave, LEFT fronto-temporal region - Intermittent slow, generalized  IMPRESSION: This study showed evidence of epileptogenicity arising from LEFT fronto-temporal region. Additionally there is mild to moderate diffuse encephalopathy. No seizures were seen throughout the recording.  Priyanka [name]    Scheduled Meds:  divalproex  500 mg Oral Q12H   enoxaparin (LOVENOX) injection  40 mg Subcutaneous Q24H   [START ON 11/15/2022] lamoTRIgine  25 mg Oral QODAY   Followed by   Melene Muller ON 11/27/2022] lamoTRIgine  25 mg Oral Daily   Followed by   Melene Muller ON 12/11/2022] lamoTRIgine  50 mg Oral Daily   Continuous Infusions:  cefTRIAXone (ROCEPHIN)  IV Stopped (11/13/22 2336)   PRN Meds: acetaminophen, LORazepam, melatonin, polyethylene glycol, prochlorperazine  Time spent: 35 minutes  Author: Gillis Santa. MD [facility] 11/14/2022 5:12 PM  To reach On-call, see care teams to locate the attending and reach out to them via [email or web address]. If 7PM-7AM, please contact night-coverage If you still have difficulty reaching the attending provider, please page the [facility] (Director on Call) for [facility] on amion for assistance.

## 2022-11-14 NOTE — Progress Notes (Signed)
LTM EEG discontinued - no skin breakdown at [facility].  

## 2022-11-14 NOTE — Plan of Care (Signed)
  Problem: Education: Goal: Knowledge of General Education information will improve Description: Including pain rating scale, medication(s)/side effects and non-pharmacologic comfort measures Outcome: Progressing   Problem: Nutrition: Goal: Adequate nutrition will be maintained Outcome: Progressing   Problem: Elimination: Goal: Will not experience complications related to urinary retention Outcome: Progressing   Problem: Skin Integrity: Goal: Risk for impaired skin integrity will decrease Outcome: Progressing   Problem: Health Behavior/Discharge Planning: Goal: Ability to manage health-related needs will improve Outcome: Not Progressing   Problem: Activity: Goal: Risk for activity intolerance will decrease Outcome: Not Progressing   Problem: Coping: Goal: Level of anxiety will decrease Outcome: Not Progressing   Problem: Elimination: Goal: Will not experience complications related to bowel motility Outcome: Not Progressing

## 2022-11-14 NOTE — Procedures (Signed)
Patient Name: Miguel Black  MRN: [id number]  Epilepsy Attending: Charlsie Quest  Referring Physician/Provider: Erick Blinks, MD  Duration: 10/14/2022 1224 to 10/15/2022 1259   Patient history:  32 y.o. male with PMH significant for RRMS, epilepsy with medication noncompliance who presents with seizure at home, got 7.5mg  of Versed per EMS. Now appears post ictal and sedated. Got loaded with Keppra 1.5g and since then, has not had any clinical seizures. Concern for aphasia out of proportion to encephalopathy. EEG to evaluate for seizure    Level of alertness: Awake, asleep   AEDs during EEG study: LTG, VPA   Technical aspects: This EEG study was done with scalp electrodes positioned according to the 10-20 International system of electrode placement. Electrical activity was reviewed with band pass filter of 1-70Hz , sensitivity of 7 uV/mm, display speed of 46mm/sec with a 60Hz  notched filter applied as appropriate. EEG data were recorded continuously and digitally stored.  Video monitoring was available and reviewed as appropriate.   Description: The posterior dominant rhythm consists of 8-9 Hz activity of moderate voltage (25-35 uV) seen predominantly in posterior head regions, symmetric and reactive to eye opening and eye closing. Sleep was characterized by vertex waves, sleep spindles (12 to 14 Hz), maximal frontocentral region. EEG showed intermittent generalized polymorphic 3  to 5 Hz theta-delta slowing. Frequent sharp waves were noted in LEFt fronto-temporal region. Hyperventilation and photic stimulation were not performed.     Of note, parts of study were difficult to interpret due to significant electrode artifact.   ABNORMALITY -Sharp wave, LEFT fronto-temporal region  - Intermittent slow, generalized    IMPRESSION: This study showed evidence of epileptogenicity arising from LEFT fronto-temporal region. Additionally there is mild to moderate diffuse encephalopathy. No  seizures were seen throughout the recording.   Faren Florence [name]

## 2022-11-14 NOTE — Plan of Care (Signed)
°  Problem: Elimination: °Goal: Will not experience complications related to bowel motility °Outcome: Progressing °  °Problem: Pain Managment: °Goal: General experience of comfort will improve °Outcome: Progressing °  °

## 2022-11-14 NOTE — Evaluation (Signed)
Occupational Therapy Evaluation Patient Details Name: Miguel Black MRN: [id number] DOB: 01-18-1991 Today's Date: 11/14/2022   History of Present Illness Pt is a 32 y/o M presenting to ED on 10/19 with seizures. PMH includes MS, ambulatory dysfunction, acute encephalopathy, marijuana abuse, seizures, L foot drop   Clinical Impression   Pt reports ind at baseline with ADLs and uses cane at times for mobility. Pt currently needing set up - CGA for ADLs, supervision for bed mobility and CGA for transfers with RW, cues for hand placement. Pt presenting with impairments listed below, will follow acutely. Recommend HHOT at d/c pending progression.       If plan is discharge home, recommend the following: A little help with walking and/or transfers;A little help with bathing/dressing/bathroom;Assistance with cooking/housework;Direct supervision/assist for medications management;Direct supervision/assist for financial management;Assist for transportation;Help with stairs or ramp for entrance    Functional Status Assessment  Patient has had a recent decline in their functional status and demonstrates the ability to make significant improvements in function in a reasonable and predictable amount of time.  Equipment Recommendations  None recommended by OT (pt has all needed DME)    Recommendations for Other Services PT consult     Precautions / Restrictions Precautions Precaution Comments: EEG Restrictions Weight Bearing Restrictions: No      Mobility Bed Mobility Overal bed mobility: Needs Assistance Bed Mobility: Supine to Sit, Sit to Supine     Supine to sit: Supervision Sit to supine: Supervision        Transfers Overall transfer level: Needs assistance Equipment used: Rolling walker (2 wheels) Transfers: Sit to/from Stand Sit to Stand: Contact guard assist           General transfer comment: min cues for safety      Balance Overall balance assessment: Needs  assistance Sitting-balance support: Feet supported Sitting balance-Leahy Scale: Good     Standing balance support: Reliant on assistive device for balance Standing balance-Leahy Scale: Fair Standing balance comment: RW in standing                           ADL either performed or assessed with clinical judgement   ADL Overall ADL's : Needs assistance/impaired Eating/Feeding: Set up;Sitting   Grooming: Set up;Sitting   Upper Body Bathing: Contact guard assist   Lower Body Bathing: Contact guard assist   Upper Body Dressing : Contact guard assist   Lower Body Dressing: Contact guard assist   Toilet Transfer: Contact guard assist;Ambulation;Regular Toilet;Rolling walker (2 wheels)           Functional mobility during ADLs: Contact guard assist;Rolling walker (2 wheels)       Vision Patient Visual Report: No change from baseline Additional Comments: pt reports blurred vision in L eye x10 years     Perception Perception: Not tested       Praxis Praxis: Not tested       Pertinent Vitals/Pain Pain Assessment Pain Assessment: No/denies pain     Extremity/Trunk Assessment Upper Extremity Assessment Upper Extremity Assessment: Generalized weakness   Lower Extremity Assessment Lower Extremity Assessment: Defer to PT evaluation   Cervical / Trunk Assessment Cervical / Trunk Assessment: Normal   Communication Communication Communication: No apparent difficulties;Other (comment) (slowed speech at times)   Cognition Arousal: Alert Behavior During Therapy: [facility] for tasks assessed/performed Overall Cognitive Status: Within Functional Limits for tasks assessed  General Comments: at times slow processing noted, but [facility] for simple math task and 3 word recall     General Comments  VSS on RA    Exercises     Shoulder Instructions      Home Living Family/patient expects to be discharged to:: Private  residence Living Arrangements: Parent;Other relatives (brother) Available Help at Discharge: Available 24 hours/day;Family Type of Home: Other(Comment) (town home) Home Access: Stairs to enter Secretary/administrator of Steps: 7 Entrance Stairs-Rails: Right;Left;Can reach both Home Layout: Two level;Bed/bath upstairs Alternate Level Stairs-Number of Steps: flight Alternate Level Stairs-Rails: Left Bathroom Shower/Tub: Chief Strategy Officer: Standard     Home Equipment: Shower seat;Cane - Programmer, applications (2 wheels);Grab bars - tub/shower;Wheelchair - manual          Prior Functioning/Environment Prior Level of Function : Independent/Modified Independent             Mobility Comments: no AD in home, when in community pt uses cane ADLs Comments: ind, does not drive, reports got his driving permit right before this happened        OT Problem List: Decreased strength;Decreased range of motion;Decreased activity tolerance;Impaired balance (sitting and/or standing)      OT Treatment/Interventions: Self-care/ADL training;Therapeutic exercise;Energy conservation;DME and/or AE instruction;Therapeutic activities;Visual/perceptual remediation/compensation;Patient/family education    OT Goals(Current goals can be found in the care plan section) Acute Rehab OT Goals Patient Stated Goal: none stated OT Goal Formulation: With patient Time For Goal Achievement: 11/28/22 Potential to Achieve Goals: Good ADL Goals Pt Will Perform Upper Body Dressing: Independently;sitting Pt Will Perform Lower Body Dressing: Independently;sitting/lateral leans;sit to/from stand Pt Will Transfer to Toilet: Independently;ambulating;regular height toilet Pt Will Perform Tub/Shower Transfer: Tub transfer;Shower transfer;Independently;ambulating;shower seat  OT Frequency: Min 1X/week    Co-evaluation              AM-PAC OT "6 Clicks" Daily Activity     Outcome Measure Help from  another person eating meals?: None Help from another person taking care of personal grooming?: A Little Help from another person toileting, which includes using toliet, bedpan, or urinal?: A Little Help from another person bathing (including washing, rinsing, drying)?: A Little Help from another person to put on and taking off regular upper body clothing?: A Little Help from another person to put on and taking off regular lower body clothing?: A Little 6 Click Score: 19   End of Session Equipment Utilized During Treatment: Rolling walker (2 wheels) Nurse Communication: Mobility status  Activity Tolerance: Patient tolerated treatment well Patient left: in bed;with call bell/phone within reach;with bed alarm set  OT Visit Diagnosis: Unsteadiness on feet (R26.81);Other abnormalities of gait and mobility (R26.89);Muscle weakness (generalized) (M62.81)                Time: 2952-8413 OT Time Calculation (min): 17 min Charges:  OT General Charges $OT Visit: 1 Visit OT Evaluation $OT Eval Low Complexity: 1 Low  Jennavieve Arrick K, OTD, OTR/L SecureChat Preferred Acute Rehab [phone]   Reona Zendejas K Koonce 11/14/2022, 10:09 AM

## 2022-11-14 NOTE — Discharge Summary (Signed)
[facility] Discharge Summary   Patient: Miguel Black YQM:[id number]  PCP: Lula Olszewski, MD  Date of admission: 11/12/2022   Date of discharge: 11/15/2022     Discharge Diagnoses:  Active Problems:   Seizures (HCC)   Admitted From: Home Disposition:  Home with [facility]  Recommendations for Outpatient Follow-up:  PCP: In 1 week Follow-up with neurology in 1 week Follow up LABS/TEST:     Follow-up Information     Home, Medi Follow up.   Why: The home health agency will contact you for the first home visit. Contact information: [address] [place] [address] [phone]         Lula Olszewski, MD Follow up in 1 week(s).   Specialty: Internal Medicine Contact information: [address] [place] [address] [phone]                Diet recommendation: Regular diet  Activity: The patient is advised to gradually reintroduce usual activities, as tolerated  Discharge Condition: stable  Code Status: Full code   History of present illness: As per the H and P dictated on admission Hospital Course:   Miguel Black is a 32 y.o. male with medical history significant for MS, ambulatory dysfunction uses a cane, Keppra induced suicidal ideation, seizure disorder on Depakote and Ocrevus injection every 6 months, reportedly compliant with his medications, who presents to the ED via EMS due to breakthrough seizures.  The patient had 5 seizures at home today.  When EMS arrived he had another 2 seizures.  Received IM Versed by EMS.  Vomited en route.  After he arrived to the ED he had another 2 seizures.   In the ED, seen by neurology upon arrival, the patient received 2 doses of loading Keppra 1 g over 15 minutes each, and was started on IV Depakote 750 mg twice daily.  Neurology recommended admission for overnight EEG with video.     Obtunded after having 9 witnessed seizures.  However, responding to painful stimuli, and appears to be protecting  his airway.     Critical care medicine consulted to assist with the management.  Admitted by [facility], hospitalist service.   ED Course: Temperature 98.9.  BP 114/73, pulse 90, respiratory rate 13, saturation 100% on room air.  Lab studies notable for serum potassium 5.4, glucose 123.  WBC 13.7, hemoglobin 13.3, platelet count 256.       Assessment and Plan:   #Multiple breakthrough seizures x 9 Seizure disorder with compliance with home regimen Prior to admission on Depakote 500 mg twice daily and Ocrevus injection every 6 months. Seen by neurology. S/p Seizure precautions  and IV Ativan for breakthrough seizures. Continuous EEG with video as recommended by neurology. lactic acid level wnl 10/20 LTM eeg reviewed till 1440. No seizure, left frontal spikes.   S/p loading dose of Keppra given 2 g IV in the ED, patient could not tolerate Keppra in the past, had suicidal ideations. S/p valproic acid 500 mg IV every 12 hourly, transition to same dose orally on discharge. 10/21 started Lamictal 25 mg p.o. every other day for 12 days, and than 25 mg daily for 14 days followed by 50 mg p.o. daily for 7 days. Neurology recommended MRI brain, which is consistent with old findings of MS, no new lesions.  Patient was cleared by neurology to discharge and follow-up as an outpatient.  Patient remained asymptomatic and agreed with the discharge planning.   # Acute metabolic  encephalopathy secondary to postictal state Encephalopathy resolved, patient is back to his baseline. # Hyperkalemia: Potassium 5.4--4.1 resolved  # Leukocytosis, Resolved, possibly reactive from seizures activity WBC on presentation 13.7 with neutrophil count of 11.4 Chest x-ray was nonacute. Ucx NGTD, blood culture growing Staph hominis, most likely contaminant. Procalcitonin negative # Possible aspiration with emesis en route via EMS Immunosuppressed on Ocrevus. S/p Rocephin given during hospital stay. Blood culture 1/4 growing Staph  hominis, most likely contamination.  Patient remained asymptomatic, no more need of antibiotics.   # MS with ambulatory dysfunction Uses a cane at baseline When more alert and stable, PT OT to evaluate with fall precautions MRI brain: Consistent with residual findings of MS, no new lesions.    Body mass index is 19.06 kg/m.  Nutrition Interventions:  Pressure Injury 06/13/22 Coccyx Stage 2 -  Partial thickness loss of dermis presenting as a shallow open injury with a red, pink wound bed without slough. (Active)  06/13/22 1700  Location: Coccyx  Location Orientation:   Staging: Stage 2 -  Partial thickness loss of dermis presenting as a shallow open injury with a red, pink wound bed without slough.  Wound Description (Comments):   Present on Admission: Yes  Dressing Type Foam - Lift dressing to assess site every shift 11/14/22 2000     - Patient was instructed, not to drive, operate heavy machinery, perform activities at heights, swimming or participation in water activities or provide baby sitting services while on Pain, Sleep and Anxiety Medications; until his outpatient Physician has advised to do so again.  - Also recommended to not to take more than prescribed Pain, Sleep and Anxiety Medications.  Patient was seen by physical therapy, who recommended Home health, which was arranged. On the day of the discharge the patient's vitals were stable, and no other acute medical condition were reported by patient. the patient was felt safe to be discharge at Home with Home health.  Consultants: Neurology Procedures: LTM EEG  Discharge Exam: General: Appear in no distress, no Rash; Oral Mucosa Clear, moist. Cardiovascular: S1 and S2 Present, no Murmur, Respiratory: normal respiratory effort, Bilateral Air entry present and no Crackles, no wheezes Abdomen: Bowel Sound present, Soft and no tenderness, no hernia Extremities: no Pedal edema, no calf tenderness Neurology: alert and oriented  to time, place, and person affect appropriate.  Filed Weights   11/12/22 1543  Weight: 62 kg   Vitals:   11/15/22 0400 11/15/22 0821  BP: 107/77 126/80  Pulse: 65 67  Resp: 16 18  Temp: 97.7 F (36.5 C) 98.9 F (37.2 C)  SpO2: 98%     DISCHARGE MEDICATION: Allergies as of 11/15/2022       Reactions   Dalfampridine Other (See Comments)   Seizure 02/2021 while on dalfampridine        Medication List     STOP taking these medications    Briviact 100 MG Tabs tablet Generic drug: brivaracetam   zonisamide 100 MG capsule Commonly known as: ZONEGRAN       TAKE these medications    divalproex 500 MG DR tablet Commonly known as: DEPAKOTE Take 1 tablet (500 mg total) by mouth every 12 (twelve) hours.   lamoTRIgine 25 MG tablet Commonly known as: LAMICTAL Take 1 tablet (25 mg total) by mouth every other day for 12 days, THEN 1 tablet (25 mg total) daily for 14 days, THEN 2 tablets (50 mg total) daily for 7 days. Start taking on: November 15, 2022  Ocrevus 300 MG/10ML injection Generic drug: ocrelizumab Inject 20 mLs (600 mg total) into the vein every 6 (six) months.   Valtoco 5 MG Dose 5 MG/0.1ML Liqd Generic drug: diazePAM Place 1 spray into the nose daily as needed.       Allergies  Allergen Reactions   Dalfampridine Other (See Comments)    Seizure 02/2021 while on dalfampridine   Discharge Instructions     Discharge instructions   Complete by: As directed    Follow with PCP in 1 week follow-up with neurology at [facility] in 1-2 weeks       The results of significant diagnostics from this hospitalization (including imaging, microbiology, ancillary and laboratory) are listed below for reference.    Significant Diagnostic Studies: MR BRAIN W WO CONTRAST  Result Date: 11/15/2022 CLINICAL DATA:  Multiple sclerosis. EXAM: MRI HEAD WITHOUT AND WITH CONTRAST TECHNIQUE: Multiplanar, multiecho pulse sequences of the brain and surrounding structures were  obtained without and with intravenous contrast. CONTRAST:  6mL GADAVIST GADOBUTROL 1 MMOL/ML IV SOLN COMPARISON:  MRI brain 01/26/2022. FINDINGS: Brain: Unchanged T2 hyperintense lesions in the juxtacortical, periventricular, and infratentorial white matter, compatible with known diagnosis of multiple sclerosis. No enhancement to suggest acute demyelination. No acute infarct or hemorrhage. No mass or abnormal enhancement. No hydrocephalus or extra-axial collection. No foci of abnormal susceptibility. Vascular: Normal flow voids and vessel enhancement. Skull and upper cervical spine: Normal marrow signal and enhancement. Sinuses/Orbits: No acute findings. Other: None. IMPRESSION: Stable white matter lesions in the juxtacortical, periventricular, and infratentorial white matter, compatible with known diagnosis of multiple sclerosis. No enhancement to suggest acute demyelination. Electronically Signed   By: Orvan Falconer M.D.   On: 11/15/2022 08:35   Overnight EEG with video  Result Date: 11/14/2022 Charlsie Quest, MD     11/14/2022 10:16 AM Patient Name: Miguel Black MRN: [id number] Epilepsy Attending: Charlsie Quest Referring Physician/Provider: Erick Blinks, MD Duration: 10/14/2022 1224 to 10/15/2022 1015  Patient history:  32 y.o. male with PMH significant for RRMS, epilepsy with medication noncompliance who presents with seizure at home, got 7.5mg  of Versed per EMS. Now appears post ictal and sedated. Got loaded with Keppra 1.5g and since then, has not had any clinical seizures. Concern for aphasia out of proportion to encephalopathy. EEG to evaluate for seizure  Level of alertness: Awake, asleep  AEDs during EEG study: LTG, VPA  Technical aspects: This EEG study was done with scalp electrodes positioned according to the 10-20 International system of electrode placement. Electrical activity was reviewed with band pass filter of 1-70Hz , sensitivity of 7 uV/mm, display speed of 53mm/sec with a  60Hz  notched filter applied as appropriate. EEG data were recorded continuously and digitally stored.  Video monitoring was available and reviewed as appropriate.  Description: The posterior dominant rhythm consists of 8-9 Hz activity of moderate voltage (25-35 uV) seen predominantly in posterior head regions, symmetric and reactive to eye opening and eye closing. Sleep was characterized by vertex waves, sleep spindles (12 to 14 Hz), maximal frontocentral region. EEG showed intermittent generalized polymorphic 3  to 5 Hz theta-delta slowing. Frequent sharp waves were noted in LEFt fronto-temporal region. Hyperventilation and photic stimulation were not performed.   Of note, parts of study were difficult to interpret due to significant electrode artifact.  ABNORMALITY -Sharp wave, LEFT fronto-temporal region - Intermittent slow, generalized  IMPRESSION: This study showed evidence of epileptogenicity arising from LEFT fronto-temporal region. Additionally there is mild to moderate diffuse encephalopathy. No seizures  were seen throughout the recording.  Charlsie Quest   DG Chest Port 1 View  Result Date: 11/12/2022 CLINICAL DATA:  Seizure EXAM: PORTABLE CHEST 1 VIEW COMPARISON:  10/08/2022 FINDINGS: The heart size and mediastinal contours are within normal limits. Small nodular density projecting over the right lung base, likely nipple shadow. Lungs are otherwise clear. No pleural effusion or pneumothorax. The visualized skeletal structures are unremarkable. IMPRESSION: 1. No acute cardiopulmonary findings. 2. Small nodular density projecting over the right lung base, likely nipple shadow. Repeat radiograph with nipple markers could be performed to confirm. Electronically Signed   By: Duanne Guess D.O.   On: 11/12/2022 17:57   CT Head Wo Contrast  Result Date: 11/12/2022 CLINICAL DATA:  Seizure disorder, clinical change EXAM: CT HEAD WITHOUT CONTRAST TECHNIQUE: Contiguous axial images were obtained from  the base of the skull through the vertex without intravenous contrast. RADIATION DOSE REDUCTION: This exam was performed according to the departmental dose-optimization program which includes automated exposure control, adjustment of the mA and/or kV according to patient size and/or use of iterative reconstruction technique. COMPARISON:  Head CT 10/06/2022 FINDINGS: Brain: No evidence of acute infarction, hemorrhage, hydrocephalus, extra-axial collection or mass lesion/mass effect. Low densities in the cerebral white matter related to known diagnosis of multiple sclerosis, unchanged in CT appearance. Vascular: No hyperdense vessel or unexpected calcification. Skull: No fracture or focal lesion. Sinuses/Orbits: No acute finding. Other: None. IMPRESSION: 1. No acute intracranial abnormality. 2. Low densities in the cerebral white matter related to known diagnosis of multiple sclerosis, unchanged in CT appearance. Electronically Signed   By: Narda Rutherford M.D.   On: 11/12/2022 17:54    Microbiology: Recent Results (from the past 240 hour(s))  Culture, blood (Routine X 2) w Reflex to ID Panel     Status: None (Preliminary result)   Collection Time: 11/13/22 12:29 AM   Specimen: BLOOD  Result Value Ref Range Status   Specimen Description BLOOD SITE NOT SPECIFIED  Final   Special Requests   Final    BOTTLES DRAWN AEROBIC AND ANAEROBIC Blood Culture adequate volume   Culture   Final    NO GROWTH 2 DAYS Performed at [facility], [address] [place], [place] [address]    Report Status PENDING  Incomplete  Culture, blood (Routine X 2) w Reflex to ID Panel     Status: Abnormal   Collection Time: 11/13/22 12:29 AM   Specimen: BLOOD  Result Value Ref Range Status   Specimen Description BLOOD SITE NOT SPECIFIED  Final   Special Requests   Final    BOTTLES DRAWN AEROBIC AND ANAEROBIC Blood Culture adequate volume   Culture  Setup Time   Final    GRAM POSITIVE COCCI IN CLUSTERS ANAEROBIC  BOTTLE ONLY CRITICAL RESULT CALLED TO, READ BACK BY AND VERIFIED WITH: PHARMD M. BITONTI 11/13/22 @ 2233 BY AB    Culture (A)  Final    STAPHYLOCOCCUS HOMINIS THE SIGNIFICANCE OF ISOLATING THIS ORGANISM FROM A SINGLE SET OF BLOOD CULTURES WHEN MULTIPLE SETS ARE DRAWN IS UNCERTAIN. PLEASE NOTIFY THE MICROBIOLOGY DEPARTMENT WITHIN ONE WEEK IF SPECIATION AND SENSITIVITIES ARE REQUIRED. Performed at [facility], [address], [place], [place] [address]    Report Status 11/15/2022 FINAL  Final  Blood Culture ID Panel (Reflexed)     Status: Abnormal   Collection Time: 11/13/22 12:29 AM  Result Value Ref Range Status   Enterococcus faecalis NOT DETECTED NOT DETECTED Final  Enterococcus Faecium NOT DETECTED NOT DETECTED Final   Listeria monocytogenes NOT DETECTED NOT DETECTED Final   Staphylococcus species DETECTED (A) NOT DETECTED Final    Comment: CRITICAL RESULT CALLED TO, READ BACK BY AND VERIFIED WITH: PHARMD M. BITONTI 11/13/22 @ 2233 BY AB    Staphylococcus aureus (BCID) NOT DETECTED NOT DETECTED Final   Staphylococcus epidermidis NOT DETECTED NOT DETECTED Final   Staphylococcus lugdunensis NOT DETECTED NOT DETECTED Final   Streptococcus species NOT DETECTED NOT DETECTED Final   Streptococcus agalactiae NOT DETECTED NOT DETECTED Final   Streptococcus pneumoniae NOT DETECTED NOT DETECTED Final   Streptococcus pyogenes NOT DETECTED NOT DETECTED Final   A.calcoaceticus-baumannii NOT DETECTED NOT DETECTED Final   Bacteroides fragilis NOT DETECTED NOT DETECTED Final   Enterobacterales NOT DETECTED NOT DETECTED Final   Enterobacter cloacae complex NOT DETECTED NOT DETECTED Final   Escherichia coli NOT DETECTED NOT DETECTED Final   Klebsiella aerogenes NOT DETECTED NOT DETECTED Final   Klebsiella oxytoca NOT DETECTED NOT DETECTED Final   Klebsiella pneumoniae NOT DETECTED NOT DETECTED Final   Proteus species NOT DETECTED NOT DETECTED Final   Salmonella species NOT DETECTED NOT  DETECTED Final   Serratia marcescens NOT DETECTED NOT DETECTED Final   Haemophilus influenzae NOT DETECTED NOT DETECTED Final   Neisseria meningitidis NOT DETECTED NOT DETECTED Final   Pseudomonas aeruginosa NOT DETECTED NOT DETECTED Final   Stenotrophomonas maltophilia NOT DETECTED NOT DETECTED Final   Candida albicans NOT DETECTED NOT DETECTED Final   Candida auris NOT DETECTED NOT DETECTED Final   Candida glabrata NOT DETECTED NOT DETECTED Final   Candida krusei NOT DETECTED NOT DETECTED Final   Candida parapsilosis NOT DETECTED NOT DETECTED Final   Candida tropicalis NOT DETECTED NOT DETECTED Final   Cryptococcus neoformans/gattii NOT DETECTED NOT DETECTED Final    Comment: Performed at [facility], [address], [place], [place] [address]  Urine Culture (for pregnant, neutropenic or urologic patients or patients with an indwelling urinary catheter)     Status: None (Preliminary result)   Collection Time: 11/14/22 10:45 AM   Specimen: Urine, Clean Catch  Result Value Ref Range Status   Specimen Description URINE, CLEAN CATCH  Final   Special Requests NONE  Final   Culture   Final    NO GROWTH Performed at [facility], [address], [place], [place] [address]    Report Status PENDING  Incomplete     Labs: CBC: Recent Labs  Lab 11/12/22 1532 11/13/22 0029 11/14/22 0742  WBC 13.7* 16.7* 7.3  NEUTROABS 11.4*  --   --   HGB 15.3 13.4 13.7  HCT 47.6 40.3 42.0  MCV 97.3 95.5 96.6  PLT 256 223 197   Basic Metabolic Panel: Recent Labs  Lab 11/12/22 1532 11/13/22 0035 11/14/22 0742  NA 140 136 141  K 5.4* 3.5 4.1  CL 105 103 110  CO2 23 23 21*  GLUCOSE 123* 95 94  BUN 10 9 14   CREATININE 1.11 0.83 0.95  CALCIUM 9.0 7.8* 8.2*  MG  --  1.9 2.0  PHOS  --  4.7* 3.5   Liver Function Tests: Recent Labs  Lab 11/12/22 1532  AST 24  ALT 20  ALKPHOS 64  BILITOT 0.3  PROT 7.4  ALBUMIN 4.0   No results for input(s): "LIPASE", "AMYLASE" in the last  168 hours. Recent Labs  Lab 11/13/22 0035  AMMONIA 46*   Cardiac Enzymes: No results for input(s): "CKTOTAL", "CKMB", "CKMBINDEX", "  TROPONINI" in the last 168 hours. BNP (last 3 results) No results for input(s): "BNP" in the last 8760 hours. CBG: No results for input(s): "GLUCAP" in the last 168 hours.  Time spent: 35 minutes  Signed:  Gillis Santa  [facility] 11/15/2022 9:58 AM

## 2022-11-14 NOTE — TOC Initial Note (Signed)
Transition of Care [facility]) - Initial/Assessment Note    Patient Details  Name: Miguel Black MRN: [id number] Date of Birth: [birth year]  Transition of [facility]) CM/SW Contact:    Kermit Balo, RN Phone Number: 11/14/2022, 2:02 PM  Clinical Narrative:                  Patient is from home with his parents. His mom works near home and his dad is at home as he is disabled.  Pts family provides needed transportation.  Pt denies any issues with his home medications.  Home health recommended. Pt without a preference after being provided choice with [email or web address]. Home health arranged with [facility]. Information on the AVS.  Pts mother will provide transportation home when medically ready.   Expected Discharge Plan: Home w Home Health Services Barriers to Discharge: Continued Medical Work up   Patient Goals and CMS Choice   CMS Medicare.gov Compare Post Acute Care list provided to:: Patient Choice offered to / list presented to : Patient, Parent      Expected Discharge Plan and Services   Discharge Planning Services: CM Consult Post Acute Care Choice: Home Health Living arrangements for the past 2 months: Apartment                           HH Arranged: OT, PT [facility]) Date [facility] Agency Contacted: 11/14/22   Representative spoke with at [facility] Agency: Eber Jones  Prior Living Arrangements/Services Living arrangements for the past 2 months: Apartment Lives with:: Parents Patient language and need for interpreter reviewed:: Yes Do you feel safe going back to the place where you live?: Yes        Care giver support system in place?: Yes (comment) Current home services: DME (cane) Criminal Activity/Legal Involvement Pertinent to Current Situation/Hospitalization: No - Comment as needed  Activities of Daily Living      Permission Sought/Granted                  Emotional Assessment Appearance:: Appears stated  age Attitude/Demeanor/Rapport: Engaged Affect (typically observed): Accepting Orientation: : Oriented to Self, Oriented to Place, Oriented to  Time, Oriented to Situation   Psych Involvement: No (comment)  Admission diagnosis:  Multiple sclerosis (HCC) [G35] Status epilepticus (HCC) [G40.901] Seizure (HCC) [R56.9] Seizures (HCC) [R56.9] Altered mental status, unspecified altered mental status type [R41.82] Patient Active Problem List   Diagnosis Date Noted   Seizures (HCC) 11/12/2022   Breakthrough seizure (HCC) 10/07/2022   Leukocytosis 10/07/2022   Hypocalcemia 10/07/2022   Elevated AST (SGOT) 10/07/2022   Status epilepticus (HCC) 06/13/2022   Ambulates with cane 06/04/2022   Ataxia 06/04/2022   Foot drop 06/03/2022   Vitamin D deficiency 06/03/2022   Smokes cigars 06/03/2022   Neurological deficit present 04/26/2022   History of optic neuritis 07/29/2020   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 [place] [address] Phone: [phone] Fax: [phone]  [facility] [place], [place] - [address]  [place] [address] Phone: [phone] Fax: [phone]  [facility] [address] [place] [address] Phone: [phone] Fax: [phone]     Social Determinants of Health (SDOH) Social History: SDOH Screenings   Depression [phone]): Low Risk  (06/03/2022)  Social Connections: Unknown (10/13/2021)   [facility]  Tobacco Use: High Risk (11/12/2022)    SDOH Interventions:     Readmission Risk Interventions    06/15/2022    2:41 PM  Readmission Risk Prevention Plan  Transportation Screening Complete  PCP or Specialist Appt within 5-7 Days Complete  Home Care Screening Complete  Medication Review (RN CM) Referral to Pharmacy

## 2022-11-14 NOTE — Evaluation (Signed)
Physical Therapy Evaluation Patient Details Name: Miguel Black MRN: [id number] DOB: 03-May-1990 Today's Date: 11/14/2022  History of Present Illness  Pt is a 32 y/o M presenting to ED on 10/19 with seizures. PMH includes MS, ambulatory dysfunction, acute encephalopathy, marijuana abuse, seizures, L foot drop  Clinical Impression  Pt admitted with above diagnosis. Reports intermittent use of SPC and no falls at home PTA. Ambulated 30 feet in room (limited by EEG line) at [facility] level assist with definite need of AD for support; declined RW. States he feels close to baseline. Agreeable to HHPT follow-up. Patient will continue to benefit from skilled physical therapy services to further improve independence with functional mobility. He will have steps to enter home and to navigate to his bedroom. 24/7 assist available at d/c, per notes.  Pt currently with functional limitations due to the deficits listed below (see PT Problem List). Pt will benefit from acute skilled PT to increase their independence and safety with mobility to allow discharge.           If plan is discharge home, recommend the following: A little help with walking and/or transfers;A little help with bathing/dressing/bathroom;Assistance with cooking/housework;Direct supervision/assist for medications management;Direct supervision/assist for financial management;Help with stairs or ramp for entrance;Assist for transportation   Can travel by private vehicle    yes    Equipment Recommendations None recommended by PT  Recommendations for Other Services       Functional Status Assessment Patient has had a recent decline in their functional status and demonstrates the ability to make significant improvements in function in a reasonable and predictable amount of time.     Precautions / Restrictions Precautions Precautions: Fall Precaution Comments: EEG Restrictions Weight Bearing Restrictions: No      Mobility  Bed  Mobility Overal bed mobility: Needs Assistance Bed Mobility: Supine to Sit, Sit to Supine     Supine to sit: Supervision Sit to supine: Supervision   General bed mobility comments: extra time, assisted with lines/leads, cues for awareness.    Transfers Overall transfer level: Needs assistance Equipment used: Straight cane Transfers: Sit to/from Stand Sit to Stand: Contact guard assist           General transfer comment: CGA for safety, assisted with lines/leads. SPC for stability with cues for awareness. Wide BOS.    Ambulation/Gait Ambulation/Gait assistance: Contact guard assist Gait Distance (Feet): 30 Feet Assistive device: Straight cane Gait Pattern/deviations: Step-through pattern, Wide base of support, Drifts right/left, Steppage, Ataxic, Decreased dorsiflexion - left, Decreased stride length Gait velocity: dec Gait velocity interpretation: <1.31 ft/sec, indicative of household ambulator   General Gait Details: CGA for safety, wide BOS, increased effort with LE advancement, dependence on [facility] for support. Spastic type gait consistent with MS. Pt reports very close to baseline. Does feel SPC helpful, declines RW use. No buckling or overt LOB but needs CGA and assist with lines/leads.  Stairs            Wheelchair Mobility     Tilt Bed    Modified Rankin (Stroke Patients Only)       Balance Overall balance assessment: Needs assistance Sitting-balance support: Feet supported Sitting balance-Leahy Scale: Good     Standing balance support: No upper extremity supported, During functional activity Standing balance-Leahy Scale: Fair Standing balance comment: Can stand with CGA no UE, but more stable with AD.  Pertinent Vitals/Pain Pain Assessment Pain Assessment: No/denies pain    Home Living Family/patient expects to be discharged to:: Private residence Living Arrangements: Parent;Other relatives  (brother) Available Help at Discharge: Available 24 hours/day;Family Type of Home: Other(Comment) (town home) Home Access: Stairs to enter Entrance Stairs-Rails: Right;Left;Can reach both Secretary/administrator of Steps: 7 Alternate Level Stairs-Number of Steps: flight Home Layout: Two level;Bed/bath upstairs Home Equipment: Shower seat;Cane - Programmer, applications (2 wheels);Grab bars - tub/shower;Wheelchair - manual;Cane - single point      Prior Function Prior Level of Function : Independent/Modified Independent             Mobility Comments: no AD in home, when in community pt uses cane ADLs Comments: ind, does not drive, reports got his driving permit right before this happened     Extremity/Trunk Assessment   Upper Extremity Assessment Upper Extremity Assessment: Defer to OT evaluation    Lower Extremity Assessment Lower Extremity Assessment: Generalized weakness (Denies new focal deficits from baseline)    Cervical / Trunk Assessment Cervical / Trunk Assessment: Normal  Communication   Communication Communication: No apparent difficulties;Other (comment) (slowed speech at times)  Cognition Arousal: Alert Behavior During Therapy: [facility] for tasks assessed/performed Overall Cognitive Status: Within Functional Limits for tasks assessed                                 General Comments: at times slow processing noted. Recalls this therapist from previous admission        General Comments General comments (skin integrity, edema, etc.): VSS on RA    Exercises     Assessment/Plan    PT Assessment Patient needs continued PT services  PT Problem List Decreased strength;Decreased range of motion;Decreased activity tolerance;Decreased balance;Decreased mobility;Decreased coordination;Decreased knowledge of use of DME;Impaired tone       PT Treatment Interventions DME instruction;Gait training;Stair training;Functional mobility training;Therapeutic  activities;Therapeutic exercise;Balance training;Neuromuscular re-education;Patient/family education    PT Goals (Current goals can be found in the Care Plan section)  Acute Rehab PT Goals Patient Stated Goal: Get well, go home soon PT Goal Formulation: With patient Time For Goal Achievement: 11/28/22 Potential to Achieve Goals: Good    Frequency Min 1X/week     Co-evaluation               AM-PAC PT "6 Clicks" Mobility  Outcome Measure Help needed turning from your back to your side while in a flat bed without using bedrails?: None Help needed moving from lying on your back to sitting on the side of a flat bed without using bedrails?: A Little Help needed moving to and from a bed to a chair (including a wheelchair)?: A Little Help needed standing up from a chair using your arms (e.g., wheelchair or bedside chair)?: A Little Help needed to walk in hospital room?: A Little Help needed climbing 3-5 steps with a railing? : A Lot 6 Click Score: 18    End of Session Equipment Utilized During Treatment: Gait belt Activity Tolerance: Patient tolerated treatment well Patient left: in bed;with call bell/phone within reach;with bed alarm set (MD in room)   PT Visit Diagnosis: Unsteadiness on feet (R26.81);Other abnormalities of gait and mobility (R26.89);Muscle weakness (generalized) (M62.81);Ataxic gait (R26.0);Difficulty in walking, not elsewhere classified (R26.2);Other symptoms and signs involving the nervous system (R29.898)    Time: 1610-9604 PT Time Calculation (min) (ACUTE ONLY): 14 min   Charges:   PT Evaluation $PT  Eval Low Complexity: 1 Low   PT General Charges $$ ACUTE PT VISIT: 1 Visit         Kathlyn Sacramento, PT, DPT [facility]  Rehabilitation Services Physical Therapist Office: [phone] Website: [email or web address]   Berton Mount 11/14/2022, 12:25 PM

## 2022-11-14 NOTE — Progress Notes (Signed)
Subjective: No acute events overnight.  Patient denies any further concerns.  States he feels like he is back to baseline.  ROS: negative except above  Examination  Vital signs in last 24 hours: Temp:  [98 F (36.7 C)-99 F (37.2 C)] 98 F (36.7 C) (10/21 0455) Pulse Rate:  [66-76] 66 (10/21 0455) Resp:  [14-18] 14 (10/21 0455) BP: (108-120)/(66-69) 110/66 (10/21 0455) SpO2:  [97 %-98 %] 98 % (10/21 0455)  General: lying in bed, NAD Neuro: MS: Alert, oriented, follows commands CN: pupils equal and reactive,  EOMI, face symmetric, tongue midline, normal sensation over face, Motor: 5/5 strength in all 4 extremities Coordination: normal Gait: not tested  Basic Metabolic Panel: Recent Labs  Lab 11/12/22 1532 11/13/22 0035 11/14/22 0742  NA 140 136 141  K 5.4* 3.5 4.1  CL 105 103 110  CO2 23 23 21*  GLUCOSE 123* 95 94  BUN 10 9 14   CREATININE 1.11 0.83 0.95  CALCIUM 9.0 7.8* 8.2*  MG  --  1.9 2.0  PHOS  --  4.7* 3.5    CBC: Recent Labs  Lab 11/12/22 1532 11/13/22 0029 11/14/22 0742  WBC 13.7* 16.7* 7.3  NEUTROABS 11.4*  --   --   HGB 15.3 13.4 13.7  HCT 47.6 40.3 42.0  MCV 97.3 95.5 96.6  PLT 256 223 197     Coagulation Studies: No results for input(s): "LABPROT", "INR" in the last 72 hours.  Imaging   CT head without contrast 11/12/2022: No acute intracranial abnormality.  Low densities in the cerebral white matter related to known diagnosis of multiple sclerosis, unchanged in CT appearance.   ASSESSMENT AND PLAN: 32 year old male with known history of MS on Ocrevus, epilepsy on Depakote 500 mg twice daily who presented with breakthrough seizures.  Epilepsy with breakthrough seizure -No clear etiology.  Of note per last neurology note on 08/08/2022, patient was on Briviact 100 milligrams twice daily and zonisamide 200 mg daily and doing well.  I cannot find any note from neurology about switching patient to Depakote from these 2 medications.  I did try  to call patient's mother to confirm medications but unfortunately it went to voicemail.  Recommendations -Initial plan was to increase Depakote.  However he already had hyperammonemia on 500 mg twice daily of Depakote.  Therefore he was started on lamotrigine -Recommend lamotrigine blue starter pack: Lamotrigine 25 mg every other day for 14 days followed by lamotrigine 25 mg daily for 14 days followed by lamotrigine 50 mg daily for 7 days.  -Continue Depakote while we slowly increase lamotrigine. -Rescue medication: Intranasal Valtoco 15mg  for seizure lasting more than 2 minutes -Patient will follow-up with Dr. Epimenio Foot at [facility] on 11/23/2022 at 10:30 AM.  Depending on tolerability and seizure control, might be able to increase lamotrigine and completely wean off Depakote if needed -DC LTM EEG -MRI brain with and without contrast to look for any new demyelinating lesions as patient now has LEFT frontotemporal epileptogenicity as well -Continue seizure precautions including do not drive -Discussed plan with patient as well as Dr. Lucianne Muss   Seizure precautions: Per [facility] statutes, patients with seizures are not allowed to drive until they have been seizure-free for six months and cleared by a physician    Use caution when using heavy equipment or power tools. Avoid working on ladders or at heights. Take showers instead of baths. Ensure the water temperature is not too high on the home water heater. Do not  go swimming alone. Do not lock yourself in a room alone (i.e. bathroom). When caring for infants or small children, sit down when holding, feeding, or changing them to minimize risk of injury to the child in the event you have a seizure. Maintain good sleep hygiene. Avoid alcohol.    If patient has another seizure, call 911 and bring them back to the ED if: A.  The seizure lasts longer than 5 minutes.      B.  The patient doesn't wake shortly after the seizure or has  new problems such as difficulty seeing, speaking or moving following the seizure C.  The patient was injured during the seizure D.  The patient has a temperature over 102 F (39C) E.  The patient vomited during the seizure and now is having trouble breathing    During the Seizure   - First, ensure adequate ventilation and place patients on the floor on their left side  Loosen clothing around the neck and ensure the airway is patent. If the patient is clenching the teeth, do not force the mouth open with any object as this can cause severe damage - Remove all items from the surrounding that can be hazardous. The patient may be oblivious to what's happening and may not even know what he or she is doing. If the patient is confused and wandering, either gently guide him/her away and block access to outside areas - Reassure the individual and be comforting - Call 911. In most cases, the seizure ends before EMS arrives. However, there are cases when seizures may last over 3 to 5 minutes. Or the individual may have developed breathing difficulties or severe injuries. If a pregnant patient or a person with diabetes develops a seizure, it is prudent to call an ambulance.   After the Seizure (Postictal Stage)   After a seizure, most patients experience confusion, fatigue, muscle pain and/or a headache. Thus, one should permit the individual to sleep. For the next few days, reassurance is essential. Being calm and helping reorient the person is also of importance.   Most seizures are painless and end spontaneously. Seizures are not harmful to others but can lead to complications such as stress on the lungs, brain and the heart. Individuals with prior lung problems may develop labored breathing and respiratory distress.      I have spent a total of  38  minutes with the patient reviewing hospital notes,  test results, labs and examining the patient as well as establishing an assessment and plan that was  discussed personally with the patient.  > 50% of time was spent in direct patient care.     Lindie Spruce Epilepsy [facility] For questions after 5pm please refer to AMION to reach the Neurologist on call

## 2022-11-14 NOTE — Plan of Care (Signed)
  Problem: Education: Goal: Knowledge of General Education information will improve Description: Including pain rating scale, medication(s)/side effects and non-pharmacologic comfort measures Outcome: Progressing   Problem: Health Behavior/Discharge Planning: Goal: Ability to manage health-related needs will improve Outcome: Progressing   Problem: Clinical Measurements: Goal: Will remain free from infection Outcome: Progressing Goal: Respiratory complications will improve Outcome: Progressing Goal: Cardiovascular complication will be avoided Outcome: Progressing   Problem: Nutrition: Goal: Adequate nutrition will be maintained Outcome: Progressing   Problem: Coping: Goal: Level of anxiety will decrease Outcome: Progressing   Problem: Elimination: Goal: Will not experience complications related to urinary retention Outcome: Progressing   Problem: Pain Managment: Goal: General experience of comfort will improve Outcome: Progressing   Problem: Skin Integrity: Goal: Risk for impaired skin integrity will decrease Outcome: Progressing

## 2022-11-15 DIAGNOSIS — R569 Unspecified convulsions: Secondary | ICD-10-CM | POA: Diagnosis not present

## 2022-11-15 LAB — CULTURE, BLOOD (ROUTINE X 2): Special Requests: ADEQUATE

## 2022-11-15 LAB — URINE CULTURE: Culture: NO GROWTH

## 2022-11-15 MED ORDER — VALTOCO 5 MG DOSE 5 MG/0.1ML NA LIQD: 1.0000 | Freq: Every day | NASAL | 1 refills | Status: DC | PRN

## 2022-11-15 MED ORDER — DIVALPROEX SODIUM 500 MG PO DR TAB: 500.0000 mg | DELAYED_RELEASE_TABLET | Freq: Two times a day (BID) | ORAL | 11 refills | Status: DC

## 2022-11-15 NOTE — Progress Notes (Signed)
Discharge instructions given. Patient verbalized understanding and all questions were answered. Patient's mother will provide transportation to home.

## 2022-11-15 NOTE — TOC Transition Note (Signed)
Transition of Care [facility]) - CM/SW Discharge Note   Patient Details  Name: Miguel Black MRN: [id number] Date of Birth: 1990/09/18  Transition of Care [facility]) CM/SW Contact:  Kermit Balo, RN Phone Number: 11/15/2022, 9:42 AM   Clinical Narrative:     Patient is discharging home with home health services through [facility]. Information on the AVS. Medi will call him for the first home visit. Pts mother to provide transportation home.  Final next level of care: Home w Home Health Services Barriers to Discharge: No Barriers Identified   Patient Goals and CMS Choice CMS Medicare.gov Compare Post Acute Care list provided to:: Patient Choice offered to / list presented to : Patient, Parent  Discharge Placement                         Discharge Plan and Services Additional resources added to the After Visit Summary for     Discharge Planning Services: CM Consult Post Acute Care Choice: Home Health                    HH Arranged: OT, PT [facility] Agency: [facility]) Date [facility] Agency Contacted: 11/14/22   Representative spoke with at [facility] Agency: Eber Jones  Social Determinants of Health (SDOH) Interventions SDOH Screenings   Depression (PHQ2-9): Low Risk  (06/03/2022)  Social Connections: Unknown (10/13/2021)   Received from [facility]  Tobacco Use: High Risk (11/12/2022)     Readmission Risk Interventions    06/15/2022    2:41 PM  Readmission Risk Prevention Plan  Transportation Screening Complete  PCP or Specialist Appt within 5-7 Days Complete  Home Care Screening Complete  Medication Review (RN CM) Referral to Pharmacy

## 2022-11-15 NOTE — Care Management Important Message (Signed)
Important Message  Patient Details  Name: Miguel Black MRN: [id number] Date of Birth: 1990/07/04   Important Message Given:  Yes - Medicare IM  Patient left prior to IM will mail a copy to the patient home address.    Ashlen Kiger 11/15/2022, 2:53 PM

## 2022-11-16 ENCOUNTER — Telehealth: Payer: Self-pay | Admitting: Internal Medicine

## 2022-11-16 ENCOUNTER — Telehealth: Payer: Self-pay

## 2022-11-16 NOTE — Telephone Encounter (Signed)
Home Health Verbal Orders  Agency:  [facility]  Caller:  Neena Rhymes and title  Requesting OT/ PT/ Skilled nursing/ Social Work/ Speech:    Reason for Request:  Pt was discharged from [facility]  - Wants to know if provider will sign orders for them to see pt for [facility] PT and OT  Frequency:    HH needs F2F w/in last 30 days      [phone]

## 2022-11-16 NOTE — Transitions of Care (Post Inpatient/ED Visit) (Signed)
   11/16/2022  Name: Miguel Black MRN: [id number] DOB: 02/03/1990  Today's TOC FU Call Status: Today's TOC FU Call Status:: Successful TOC FU Call Completed TOC FU Call Complete Date: 11/16/22 Patient's Name and Date of Birth confirmed.  Transition Care Management Follow-up Telephone Call Date of Discharge: 11/15/22 Discharge Facility: [facility]) Type of Discharge: Inpatient Admission Primary Inpatient Discharge Diagnosis:: seizures How have you been since you were released from the hospital?: Better Any questions or concerns?: No  Items Reviewed: Did you receive and understand the discharge instructions provided?: Yes Medications obtained,verified, and reconciled?: Yes (Medications Reviewed) Any new allergies since your discharge?: No Dietary orders reviewed?: Yes People in Home: parent(s)  Medications Reviewed Today: Medications Reviewed Today     Reviewed by Karena Addison, LPN (Licensed Practical Nurse) on 11/16/22 at 1214  Med List Status: <None>   Medication Order Taking? Sig Documenting Provider Last Dose Status Informant  diazePAM (VALTOCO 5 MG DOSE) 5 MG/0.1ML LIQD [id number]  Place 1 spray into the nose daily as needed. Gillis Santa, MD  Active   divalproex (DEPAKOTE) 500 MG DR tablet [id number]  Take 1 tablet (500 mg total) by mouth every 12 (twelve) hours. Gillis Santa, MD  Active   lamoTRIgine (LAMICTAL) 25 MG tablet [id number]  Take 1 tablet (25 mg total) by mouth every other day for 12 days, THEN 1 tablet (25 mg total) daily for 14 days, THEN 2 tablets (50 mg total) daily for 7 days. Gillis Santa, MD  Active   ocrelizumab (OCREVUS) 300 MG/10ML injection [id number] No Inject 20 mLs (600 mg total) into the vein every 6 (six) months. Sater, Pearletha Furl, MD 6 months Active Mother, Pharmacy Records           Med Note Epimenio Sarin, Carlota Raspberry Nov 14, 2022 10:53 AM) Given by provider. Next dose due on 11/23/22.            Home Care and  Equipment/Supplies: Were Home Health Services Ordered?: NA Any new equipment or medical supplies ordered?: NA  Functional Questionnaire: Do you need assistance with bathing/showering or dressing?: No Do you need assistance with meal preparation?: No Do you need assistance with eating?: No Do you have difficulty maintaining continence: No Do you need assistance with getting out of bed/getting out of a chair/moving?: No Do you have difficulty managing or taking your medications?: No  Follow up appointments reviewed: PCP Follow-up appointment confirmed?: Yes Date of PCP follow-up appointment?: 11/21/22 Follow-up Provider: [facility] Follow-up appointment confirmed?: NA Do you need transportation to your follow-up appointment?: No Do you understand care options if your condition(s) worsen?: Yes-patient verbalized understanding    SIGNATURE Karena Addison, LPN [facility] Nurse Health Advisor Direct Dial [phone]

## 2022-11-17 ENCOUNTER — Telehealth: Payer: Self-pay

## 2022-11-17 NOTE — Transitions of Care (Post Inpatient/ED Visit) (Signed)
   11/17/2022  Name: Miguel Black MRN: [id number] DOB: July 23, 1990  Today's TOC FU Call Status: Today's TOC FU Call Status:: Unsuccessful Call (1st Attempt) Unsuccessful Call (1st Attempt) Date: 11/17/22  Unsuccessful outreach attempt to engage pt for 30-day TOC program.   Follow Up Plan: Additional outreach attempts will be made to reach the patient to complete the Transitions of Care (Post Inpatient/ED visit) call.   Antionette Fairy, RN,BSN,CCM RN Care Manager Transitions of Care  [facility]  Direct Phone: [phone] Toll Free: [phone] Fax: [phone]

## 2022-11-17 NOTE — Transitions of Care (Post Inpatient/ED Visit) (Signed)
11/17/2022  Name: Miguel Black MRN: [id number] DOB: 02-Sep-1990  Today's TOC FU Call Status: Today's TOC FU Call Status:: Successful TOC FU Call Completed TOC FU Call Complete Date: 11/17/22 Patient's Name and Date of Birth confirmed. Discussed TOC program and weekly calls to patient to assess condition/status and provide ongoing support/education. Patient declined. Provided with RN CM contact info and advised to call for any RN CM needs or concerns.  Transition Care Management Follow-up Telephone Call Date of Discharge: 11/15/22 Discharge Facility: [facility]) Type of Discharge: Inpatient Admission Primary Inpatient Discharge Diagnosis:: "status epilepticus" How have you been since you were released from the hospital?: Better (pt voices he is doing good and recovering well-eating,sleeping and walking without any issues or concerns. Denies any pain.) Any questions or concerns?: No  Items Reviewed: Did you receive and understand the discharge instructions provided?: Yes Medications obtained,verified, and reconciled?: Yes (Medications Reviewed) Any new allergies since your discharge?: No Dietary orders reviewed?: Yes Type of Diet Ordered:: low salt/heart healthy Do you have support at home?: Yes People in Home: parent(s)  Medications Reviewed Today: Medications Reviewed Today     Reviewed by Charlyn Minerva, RN (Registered Nurse) on 11/17/22 at 1454  Med List Status: <None>   Medication Order Taking? Sig Documenting Provider Last Dose Status Informant  diazePAM (VALTOCO 5 MG DOSE) 5 MG/0.1ML LIQD [id number] Yes Place 1 spray into the nose daily as needed. Gillis Santa, MD Taking Active   divalproex (DEPAKOTE) 500 MG DR tablet [id number] Yes Take 1 tablet (500 mg total) by mouth every 12 (twelve) hours. Gillis Santa, MD Taking Active   lamoTRIgine (LAMICTAL) 25 MG tablet [id number] Yes Take 1 tablet (25 mg total) by mouth every other day for 12 days, THEN 1 tablet  (25 mg total) daily for 14 days, THEN 2 tablets (50 mg total) daily for 7 days. Gillis Santa, MD Taking Active   ocrelizumab (OCREVUS) 300 MG/10ML injection [id number] Yes Inject 20 mLs (600 mg total) into the vein every 6 (six) months. Asa Lente, MD Taking Active Mother, Pharmacy Records           Med Note Epimenio Sarin, Carlota Raspberry Nov 14, 2022 10:53 AM) Given by provider. Next dose due on 11/23/22.            Home Care and Equipment/Supplies: Were Home Health Services Ordered?: Yes Name of Home Health Agency:: [facility] Has Agency set up a time to come to your home?: No (pt states he declined visit from therapist-doesn't feel like he needs it -walking with cane at his baseline and doesn't like HH PT-states if he need therapy he will talk with provider about outpt therapy) Any new equipment or medical supplies ordered?: No  Functional Questionnaire: Do you need assistance with bathing/showering or dressing?: No Do you need assistance with meal preparation?: No Do you need assistance with eating?: No Do you have difficulty maintaining continence: No Do you need assistance with getting out of bed/getting out of a chair/moving?: No Do you have difficulty managing or taking your medications?: No  Follow up appointments reviewed: PCP Follow-up appointment confirmed?: Yes Date of PCP follow-up appointment?: 11/24/22 Follow-up Provider: [facility] [place] Follow-up appointment confirmed?: Yes Date of Specialist follow-up appointment?: 11/23/22 Follow-Up Specialty Provider:: Marlou Sa Do you need transportation to your follow-up appointment?: No (pt confirms he is able to get to appts) Do you understand care options if your condition(s) worsen?: Yes-patient verbalized understanding  SDOH  Interventions Today    Flowsheet Row Most Recent Value  SDOH Interventions   Food Insecurity Interventions Intervention Not Indicated  Transportation Interventions  Intervention Not Indicated        Antionette Fairy, RN,BSN,CCM RN Care Manager Transitions of Care  [facility]  Direct Phone: [phone] Toll Free: [phone] Fax: [phone]

## 2022-11-18 LAB — CULTURE, BLOOD (ROUTINE X 2)
Culture: NO GROWTH
Special Requests: ADEQUATE

## 2022-11-18 NOTE — Telephone Encounter (Signed)
Left vm with verbal orders for OT and PT for this patient.

## 2022-11-21 ENCOUNTER — Inpatient Hospital Stay: Payer: Medicare (Managed Care) | Admitting: Internal Medicine

## 2022-11-23 ENCOUNTER — Ambulatory Visit: Payer: Medicare (Managed Care) | Admitting: Neurology

## 2022-11-24 ENCOUNTER — Encounter: Payer: Self-pay | Admitting: Internal Medicine

## 2022-11-24 ENCOUNTER — Ambulatory Visit: Payer: Medicare (Managed Care) | Admitting: Internal Medicine

## 2022-11-24 VITALS — BP 113/70 | HR 97 | Temp 98.4°F | Ht 71.0 in | Wt 163.4 lb

## 2022-11-24 DIAGNOSIS — D72829 Elevated white blood cell count, unspecified: Secondary | ICD-10-CM

## 2022-11-24 DIAGNOSIS — G40909 Epilepsy, unspecified, not intractable, without status epilepticus: Secondary | ICD-10-CM

## 2022-11-24 DIAGNOSIS — G40919 Epilepsy, unspecified, intractable, without status epilepticus: Secondary | ICD-10-CM | POA: Diagnosis not present

## 2022-11-24 DIAGNOSIS — G35 Multiple sclerosis: Secondary | ICD-10-CM | POA: Diagnosis not present

## 2022-11-24 DIAGNOSIS — R569 Unspecified convulsions: Secondary | ICD-10-CM

## 2022-11-24 MED ORDER — DIVALPROEX SODIUM 500 MG PO DR TAB: 500.0000 mg | DELAYED_RELEASE_TABLET | Freq: Two times a day (BID) | ORAL | 0 refills | Status: DC

## 2022-11-24 MED ORDER — TOPIRAMATE 25 MG PO TABS
50.0000 mg | ORAL_TABLET | Freq: Two times a day (BID) | ORAL | 0 refills | Status: DC
Start: 2022-12-14 — End: 2023-01-06

## 2022-11-24 MED ORDER — VALTOCO 15 MG DOSE 7.5 MG/0.1ML NA LQPK: 1.0000 | Freq: Every day | NASAL | 2 refills | Status: DC | PRN

## 2022-11-24 NOTE — Assessment & Plan Note (Signed)
We will check leukocyte count but suspect this was secondary to the seizure although there was positive blood cultures noted in the hospital he is not having any signs and symptoms of infection and it was presumed contaminated

## 2022-11-24 NOTE — Patient Instructions (Addendum)
VISIT SUMMARY:  You came in today for a follow-up after your recent hospitalization due to a breakthrough seizure. You have a history of multiple sclerosis and seizure disorder, and your seizures have been increasing in frequency. Your current treatment includes Ocrevus infusions, valproic acid, and lamotrigine. You and your caregiver are exploring additional support options, including a service dog trained to detect seizures. You have also stopped smoking cigars, which is a positive step for your lung health.  YOUR PLAN:  -MULTIPLE SCLEROSIS INDUCED SEIZURES: Your seizures are related to your multiple sclerosis. We will continue your current medications, extend your Lamotrigine prescription for three months, and check your Valproic acid levels. An urgent referral to a new neurologist is necessary.  -NASAL SEIZURE SPRAY (VALTOCO): Valtoco nasal spray has been effective in managing your breakthrough seizures. We will increase the dose of your Valtoco nasal spray.  -MULTIPLE SCLEROSIS: Multiple sclerosis is a condition that affects the brain and spinal cord. You recently received an Ocrevus infusion, and we will continue with this treatment.  -GENERAL HEALTH MAINTENANCE: We discussed obtaining a seizure dog and setting up seizure-detecting technology for your safety. Increasing the CBD component of cannabis may help prevent seizures. You should continue to avoid smoking cigars to protect your lungs. A follow-up appointment is scheduled in one week.  INSTRUCTIONS:  Please follow up with the new neurologist urgently. Continue with your current medication regimen and get your Valproic acid levels checked. We will see you again in one week for a follow-up appointment.

## 2022-11-24 NOTE — Progress Notes (Signed)
[facility]: [phone]   -- Medical Office Visit --  Patient:  Miguel Black      Age: 32 y.o.       Sex:  male  Date:   11/24/2022 Today's Healthcare Provider: Lula Olszewski, MD  ============================================================================================= Assessment Plan    Assessment & Plan Seizure disorder [facility]) We will consider obtaining a seizure dog and setting up seizure-detecting technology for safety. Increasing the CBD component of cannabis for potential seizure prevention is advisable. He should discontinue smoking cigars to avoid potential lung damage and risks associated with repeated intubation. A follow-up in one week is scheduled. multiple sclerosis He is experiencing frequent breakthrough seizures despite a maximized medication regimen, currently including Divalproex 500mg  BID and Lamotrigine 25mg  with a ramping dose, following the discontinuation of Keppra due to suicidal ideations. We will continue his current medication regimen, extend the Lamotrigine prescription to three months, and check Valproic acid levels due to interaction with Topiramate. An urgent referral to a new neurologist is necessary.  Valtoco nasal spray has been effective in managing his breakthrough seizures on the lowest dose. We will increase the dose of Valtoco nasal spray.  He recently received an Ocrevus infusion. We will continue the current treatment regimen.  Breakthrough seizure (HCC) According to the patient this occurred as he was shifting off of Keppra due to suicidal ideation and the suicidal ideations have gone away and he has not had any further seizure activity since he was on board to valproic acid in the hospital initially with 750 mg twice daily now de-escalated to 500 mg twice daily and he is also on a ramping topiramate we will also resupply Valtoco nasal spray to ensure his safety and discussed early warning symptoms for seizures  We  will check valproic acid levels Seizures (HCC)  Leukocytosis, unspecified type We will check leukocyte count but suspect this was secondary to the seizure although there was positive blood cultures noted in the hospital he is not having any signs and symptoms of infection and it was presumed contaminated  Diagnoses and all orders for this visit: Seizure disorder (HCC) -     Ambulatory referral to Neurology -     diazePAM, 15 MG Dose, (VALTOCO 15 MG DOSE) 2 x 7.5 MG/0.1ML LQPK; Place 1 Dose into the nose daily as needed. -     topiramate (TOPAMAX) 25 MG tablet; Take 2 tablets (50 mg total) by mouth 2 (two) times daily. -     divalproex (DEPAKOTE) 500 MG DR tablet; Take 1 tablet (500 mg total) by mouth every 12 (twelve) hours. -     Valproic acid level; Future -     CBC with Differential/Platelet; Future multiple sclerosis -     Ambulatory referral to Neurology -     diazePAM, 15 MG Dose, (VALTOCO 15 MG DOSE) 2 x 7.5 MG/0.1ML LQPK; Place 1 Dose into the nose daily as needed. -     topiramate (TOPAMAX) 25 MG tablet; Take 2 tablets (50 mg total) by mouth 2 (two) times daily. -     divalproex (DEPAKOTE) 500 MG DR tablet; Take 1 tablet (500 mg total) by mouth every 12 (twelve) hours. -     Valproic acid level; Future -     CBC with Differential/Platelet; Future Breakthrough seizure (HCC) -     Ambulatory referral to Neurology -     diazePAM, 15 MG Dose, (VALTOCO 15 MG DOSE) 2 x 7.5 MG/0.1ML LQPK; Place 1 Dose into the  nose daily as needed. -     topiramate (TOPAMAX) 25 MG tablet; Take 2 tablets (50 mg total) by mouth 2 (two) times daily. -     divalproex (DEPAKOTE) 500 MG DR tablet; Take 1 tablet (500 mg total) by mouth every 12 (twelve) hours. -     Valproic acid level; Future -     CBC with Differential/Platelet; Future Seizures (HCC) -     Ambulatory referral to Neurology -     diazePAM, 15 MG Dose, (VALTOCO 15 MG DOSE) 2 x 7.5 MG/0.1ML LQPK; Place 1 Dose into the nose daily as needed. -      topiramate (TOPAMAX) 25 MG tablet; Take 2 tablets (50 mg total) by mouth 2 (two) times daily. -     divalproex (DEPAKOTE) 500 MG DR tablet; Take 1 tablet (500 mg total) by mouth every 12 (twelve) hours. -     Valproic acid level; Future -     CBC with Differential/Platelet; Future Leukocytosis, unspecified type -     CBC with Differential/Platelet; Future  Recommended follow-up: No follow-ups on file. Future Appointments  Date Time Provider Department Center  12/05/2022 11:00 AM Lula Olszewski, MD [facility]  04/03/2023 10:00 AM Sater, Pearletha Furl, MD [facility] None  Patient Care Team: Lula Olszewski, MD as PCP - General (Internal Medicine) Sater, Pearletha Furl, MD (Neurology) Shawnie Dapper, NP as Nurse Practitioner (Neurology)    Subjective   32 y.o. male who has multiple sclerosis; Gait disturbance; Left-sided weakness; Other fatigue; Seizure disorder (HCC); Marijuana use; High risk medication use; History of optic neuritis; Internuclear ophthalmoplegia of right eye; Neurological deficit present; Hypermetropia of left eye; Foot drop; Vitamin D deficiency; Smokes cigars; Ambulates with cane; Ataxia; Status epilepticus (HCC); Breakthrough seizure (HCC); Leukocytosis; Hypocalcemia; Elevated AST (SGOT); and Seizures (HCC) on their problem list.. Main reasons for visit/main concerns/chief complaint: Hospitalization Follow-up (For a seizure. Wants to clarify medications. Lamictal increase and unsure about new medication (hasn't started).)   ------------------------------------------------------------------------------------------------------------------------ AI-Extracted: Discussed the use of AI scribe software for clinical note transcription with the patient, who gave verbal consent to proceed.  History of Present Illness   The patient, diagnosed with multiple sclerosis and seizure disorder, presents for follow-up after recent hospitalization due to a breakthrough seizure. The patient reports a  history of seizures, which have been increasing in frequency over the past few months. The patient's mother, who is the primary caregiver, has been able to intervene during these episodes with a nasal spray (Valtoco), which has been effective in aborting the seizures.  The patient's seizure disorder is believed to be secondary to multiple sclerosis, which is being managed with Ocrevus infusions. The patient recently had an infusion and reports no new symptoms or complications related to the treatment.  The patient reports feeling well on the current regimen, with no breakthrough seizures since the last hospitalization.  The patient's mother recalls this change but is unsure of the reasoning behind it.  The patient and caregiver have concerns about the patient living alone due to the risk of unattended seizures. He is considering options for additional support, including a service dog trained to detect seizures.  The patient denies any recent falls or difficulties with mobility, using a cane for assistance. The patient also reports cessation of cigar smoking, which was a concern due to potential lung damage and the risk of needing intubation due to seizures.  In summary, the patient with multiple sclerosis and a seizure disorder is currently stable on  a regimen of Ocrevus, valproic acid, and lamotrigine. The patient and caregiver are actively seeking additional support and resources to manage the patient's condition.       Note that patient  has a past medical history of Acute encephalopathy (04/26/2022), Acute metabolic encephalopathy (04/26/2022), Acute respiratory failure with hypoxia (HCC) (08/23/2021), Aspiration pneumonia (HCC) (07/12/2018), Closed fracture of distal phalanx or phalanges of hand (02/05/2009), Encounter for screening for HIV (07/12/2018), Foot drop (06/03/2022), Marijuana abuse (05/01/2013), MS (multiple sclerosis) (HCC), Seizure (HCC), Seizures (HCC) (01/22/2019), and Status  epilepticus (HCC) (07/12/2018).  Problem list overviews that were updated at today's visit:No problems updated.  Med reconciliation: Current Outpatient Medications on File Prior to Visit  Medication Sig   lamoTRIgine (LAMICTAL) 25 MG tablet Take 1 tablet (25 mg total) by mouth every other day for 12 days, THEN 1 tablet (25 mg total) daily for 14 days, THEN 2 tablets (50 mg total) daily for 7 days.   ocrelizumab (OCREVUS) 300 MG/10ML injection Inject 20 mLs (600 mg total) into the vein every 6 (six) months.   No current facility-administered medications on file prior to visit.   Medications Discontinued During This Encounter  Medication Reason   diazePAM (VALTOCO 5 MG DOSE) 5 MG/0.1ML LIQD    divalproex (DEPAKOTE) 500 MG DR tablet Reorder     Objective   Physical Exam  BP 113/70 (BP Location: Left Arm, Patient Position: Sitting)   Pulse 97   Temp 98.4 F (36.9 C) (Temporal)   Ht 5\' 11"  (1.803 m)   Wt 163 lb 6.4 oz (74.1 kg)   SpO2 98%   BMI 22.79 kg/m  Wt Readings from Last 10 Encounters:  11/24/22 163 lb 6.4 oz (74.1 kg)  11/12/22 136 lb 11 oz (62 kg)  10/06/22 143 lb 4.8 oz (65 kg)  08/08/22 142 lb (64.4 kg)  06/16/22 154 lb 5.2 oz (70 kg)  06/03/22 149 lb 12.8 oz (67.9 kg)  01/05/22 161 lb 8 oz (73.3 kg)  08/27/21 150 lb 5.7 oz (68.2 kg)  06/24/21 153 lb (69.4 kg)  03/10/21 158 lb 8.2 oz (71.9 kg)   Vital signs reviewed.  Nursing notes reviewed. Weight trend reviewed. Abnormalities and Problem-Specific physical exam findings:  walks with cane, speech stutters, very endearing gentleman  General Appearance:  No acute distress appreciable.   Well-groomed, healthy-appearing male.  Well proportioned with no abnormal fat distribution.  Good muscle tone. Pulmonary:  Normal work of breathing at rest, no respiratory distress apparent. SpO2: 98 %  Musculoskeletal: All extremities are intact.  Neurological:  Awake, alert, oriented, and engaged.  No obvious focal neurological  deficits or cognitive impairments.  Sensorium seems unclouded.   Speech is clear and coherent with logical content. Psychiatric:  Appropriate mood, pleasant and cooperative demeanor, thoughtful and engaged during the exam  Results   LABS Potassium: elevated WBC: elevated  RADIOLOGY Brain MRI: no new lesions        No results found for any visits on 11/24/22.  Admission on 11/12/2022, Discharged on 11/15/2022  Component Date Value   WBC 11/12/2022 13.7 (H)    RBC 11/12/2022 4.89    Hemoglobin 11/12/2022 15.3    HCT 11/12/2022 47.6    MCV 11/12/2022 97.3    MCH 11/12/2022 31.3    MCHC 11/12/2022 32.1    RDW 11/12/2022 13.2    Platelets 11/12/2022 256    nRBC 11/12/2022 0.0    Neutrophils Relative % 11/12/2022 84    Neutro Abs 11/12/2022 11.4 (H)  Lymphocytes Relative 11/12/2022 12    Lymphs Abs 11/12/2022 1.7    Monocytes Relative 11/12/2022 3    Monocytes Absolute 11/12/2022 0.4    Eosinophils Relative 11/12/2022 0    Eosinophils Absolute 11/12/2022 0.0    Basophils Relative 11/12/2022 0    Basophils Absolute 11/12/2022 0.1    Immature Granulocytes 11/12/2022 1    Abs Immature Granulocytes 11/12/2022 0.13 (H)    Sodium 11/12/2022 140    Potassium 11/12/2022 5.4 (H)    Chloride 11/12/2022 105    CO2 11/12/2022 23    Glucose, Bld 11/12/2022 123 (H)    BUN 11/12/2022 10    Creatinine, Ser 11/12/2022 1.11    Calcium 11/12/2022 9.0    Total Protein 11/12/2022 7.4    Albumin 11/12/2022 4.0    AST 11/12/2022 24    ALT 11/12/2022 20    Alkaline Phosphatase 11/12/2022 64    Total Bilirubin 11/12/2022 0.3    GFR, Estimated 11/12/2022 >60    Anion gap 11/12/2022 12    Alcohol, Ethyl (B) 11/12/2022 <10    Valproic Acid Lvl 11/12/2022 57    Ammonia 11/13/2022 46 (H)    Sodium 11/13/2022 136    Potassium 11/13/2022 3.5    Chloride 11/13/2022 103    CO2 11/13/2022 23    Glucose, Bld 11/13/2022 95    BUN 11/13/2022 9    Creatinine, Ser 11/13/2022 0.83    Calcium  11/13/2022 7.8 (L)    GFR, Estimated 11/13/2022 >60    Anion gap 11/13/2022 10    Lactic Acid, Venous 11/13/2022 1.6    Lactic Acid, Venous 11/13/2022 1.0    WBC 11/13/2022 16.7 (H)    RBC 11/13/2022 4.22    Hemoglobin 11/13/2022 13.4    HCT 11/13/2022 40.3    MCV 11/13/2022 95.5    MCH 11/13/2022 31.8    MCHC 11/13/2022 33.3    RDW 11/13/2022 13.2    Platelets 11/13/2022 223    nRBC 11/13/2022 0.0    Specimen Description 11/14/2022 URINE, CLEAN CATCH    Special Requests 11/14/2022 NONE    Culture 11/14/2022                     Value:NO GROWTH Performed at [facility], [address] [place], [place] [address]    Report Status 11/14/2022 11/15/2022 FINAL    Specimen Description 11/13/2022 BLOOD SITE NOT SPECIFIED    Special Requests 11/13/2022 BOTTLES DRAWN AEROBIC AND ANAEROBIC Blood Culture adequate volume    Culture 11/13/2022                     Value:NO GROWTH 5 DAYS Performed at [facility], [address] [place], [place] [address]    Report Status 11/13/2022 11/18/2022 FINAL    Specimen Description 11/13/2022 BLOOD SITE NOT SPECIFIED    Special Requests 11/13/2022 BOTTLES DRAWN AEROBIC AND ANAEROBIC Blood Culture adequate volume    Culture  Setup Time 11/13/2022                     Value:GRAM POSITIVE COCCI IN CLUSTERS ANAEROBIC BOTTLE ONLY CRITICAL RESULT CALLED TO, READ BACK BY AND VERIFIED WITH: PHARMD M. BITONTI 11/13/22 @ 2233 BY AB    Culture 11/13/2022  (A)                    Value:STAPHYLOCOCCUS HOMINIS THE SIGNIFICANCE OF ISOLATING THIS ORGANISM FROM A SINGLE SET OF BLOOD CULTURES WHEN  MULTIPLE SETS ARE DRAWN IS UNCERTAIN. PLEASE NOTIFY THE MICROBIOLOGY DEPARTMENT WITHIN ONE WEEK IF SPECIATION AND SENSITIVITIES ARE REQUIRED. Performed at [facility], [address] [place], [place] [address]    Report Status 11/13/2022 11/15/2022 FINAL    Magnesium 11/13/2022 1.9    Procalcitonin 11/13/2022 <0.10    Phosphorus 11/13/2022 4.7 (H)     Opiates 11/13/2022 NONE DETECTED    Cocaine 11/13/2022 NONE DETECTED    Benzodiazepines 11/13/2022 POSITIVE (A)    Amphetamines 11/13/2022 NONE DETECTED    Tetrahydrocannabinol 11/13/2022 POSITIVE (A)    Barbiturates 11/13/2022 NONE DETECTED    Color, Urine 11/13/2022 YELLOW    APPearance 11/13/2022 HAZY (A)    Specific Gravity, Urine 11/13/2022 1.033 (H)    pH 11/13/2022 7.0    Glucose, UA 11/13/2022 NEGATIVE    Hgb urine dipstick 11/13/2022 NEGATIVE    Bilirubin Urine 11/13/2022 NEGATIVE    Ketones, ur 11/13/2022 5 (A)    Protein, ur 11/13/2022 NEGATIVE    Nitrite 11/13/2022 NEGATIVE    Leukocytes,Ua 11/13/2022 NEGATIVE    Phosphorus 11/14/2022 3.5    Magnesium 11/14/2022 2.0    WBC 11/14/2022 7.3    RBC 11/14/2022 4.35    Hemoglobin 11/14/2022 13.7    HCT 11/14/2022 42.0    MCV 11/14/2022 96.6    MCH 11/14/2022 31.5    MCHC 11/14/2022 32.6    RDW 11/14/2022 13.2    Platelets 11/14/2022 197    nRBC 11/14/2022 0.0    Sodium 11/14/2022 141    Potassium 11/14/2022 4.1    Chloride 11/14/2022 110    CO2 11/14/2022 21 (L)    Glucose, Bld 11/14/2022 94    BUN 11/14/2022 14    Creatinine, Ser 11/14/2022 0.95    Calcium 11/14/2022 8.2 (L)    GFR, Estimated 11/14/2022 >60    Anion gap 11/14/2022 10    Enterococcus faecalis 11/13/2022 NOT DETECTED    Enterococcus Faecium 11/13/2022 NOT DETECTED    Listeria monocytogenes 11/13/2022 NOT DETECTED    Staphylococcus species 11/13/2022 DETECTED (A)    Staphylococcus aureus (B* 11/13/2022 NOT DETECTED    Staphylococcus epidermid* 11/13/2022 NOT DETECTED    Staphylococcus lugdunens* 11/13/2022 NOT DETECTED    Streptococcus species 11/13/2022 NOT DETECTED    Streptococcus agalactiae 11/13/2022 NOT DETECTED    Streptococcus pneumoniae 11/13/2022 NOT DETECTED    Streptococcus pyogenes 11/13/2022 NOT DETECTED    A.calcoaceticus-baumannii 11/13/2022 NOT DETECTED    Bacteroides fragilis 11/13/2022 NOT DETECTED    Enterobacterales  11/13/2022 NOT DETECTED    Enterobacter cloacae com* 11/13/2022 NOT DETECTED    Escherichia coli 11/13/2022 NOT DETECTED    Klebsiella aerogenes 11/13/2022 NOT DETECTED    Klebsiella oxytoca 11/13/2022 NOT DETECTED    Klebsiella pneumoniae 11/13/2022 NOT DETECTED    Proteus species 11/13/2022 NOT DETECTED    Salmonella species 11/13/2022 NOT DETECTED    Serratia marcescens 11/13/2022 NOT DETECTED    Haemophilus influenzae 11/13/2022 NOT DETECTED    Neisseria meningitidis 11/13/2022 NOT DETECTED    Pseudomonas aeruginosa 11/13/2022 NOT DETECTED    Stenotrophomonas maltoph* 11/13/2022 NOT DETECTED    Candida albicans 11/13/2022 NOT DETECTED    Candida auris 11/13/2022 NOT DETECTED    Candida glabrata 11/13/2022 NOT DETECTED    Candida krusei 11/13/2022 NOT DETECTED    Candida parapsilosis 11/13/2022 NOT DETECTED    Candida tropicalis 11/13/2022 NOT DETECTED    Cryptococcus neoformans/* 11/13/2022 NOT DETECTED   Admission on 10/06/2022, Discharged on 10/09/2022  Component Date Value   WBC 10/06/2022  16.5 (H)    RBC 10/06/2022 4.84    Hemoglobin 10/06/2022 15.2    HCT 10/06/2022 47.1    MCV 10/06/2022 97.3    MCH 10/06/2022 31.4    MCHC 10/06/2022 32.3    RDW 10/06/2022 13.5    Platelets 10/06/2022 265    nRBC 10/06/2022 0.0    Neutrophils Relative % 10/06/2022 85    Neutro Abs 10/06/2022 14.1 (H)    Lymphocytes Relative 10/06/2022 8    Lymphs Abs 10/06/2022 1.3    Monocytes Relative 10/06/2022 5    Monocytes Absolute 10/06/2022 0.8    Eosinophils Relative 10/06/2022 0    Eosinophils Absolute 10/06/2022 0.0    Basophils Relative 10/06/2022 0    Basophils Absolute 10/06/2022 0.1    Immature Granulocytes 10/06/2022 2    Abs Immature Granulocytes 10/06/2022 0.28 (H)    Sodium 10/06/2022 135    Potassium 10/06/2022 5.3 (H)    Chloride 10/06/2022 104    CO2 10/06/2022 18 (L)    Glucose, Bld 10/06/2022 110 (H)    BUN 10/06/2022 12    Creatinine, Ser 10/06/2022 1.08    Calcium  10/06/2022 8.3 (L)    Total Protein 10/06/2022 6.7    Albumin 10/06/2022 4.1    AST 10/06/2022 42 (H)    ALT 10/06/2022 24    Alkaline Phosphatase 10/06/2022 57    Total Bilirubin 10/06/2022 1.0    GFR, Estimated 10/06/2022 >60    Anion gap 10/06/2022 13    Glucose-Capillary 10/06/2022 118 (H)    Color, Urine 10/07/2022 YELLOW    APPearance 10/07/2022 CLEAR    Specific Gravity, Urine 10/07/2022 1.020    pH 10/07/2022 6.0    Glucose, UA 10/07/2022 NEGATIVE    Hgb urine dipstick 10/07/2022 NEGATIVE    Bilirubin Urine 10/07/2022 NEGATIVE    Ketones, ur 10/07/2022 NEGATIVE    Protein, ur 10/07/2022 NEGATIVE    Nitrite 10/07/2022 NEGATIVE    Leukocytes,Ua 10/07/2022 NEGATIVE    Total CK 10/07/2022 510 (H)    WBC 10/08/2022 6.8    RBC 10/08/2022 4.06 (L)    Hemoglobin 10/08/2022 13.2    HCT 10/08/2022 39.7    MCV 10/08/2022 97.8    MCH 10/08/2022 32.5    MCHC 10/08/2022 33.2    RDW 10/08/2022 13.6    Platelets 10/08/2022 223    nRBC 10/08/2022 0.0    Sodium 10/08/2022 143    Potassium 10/08/2022 3.9    Chloride 10/08/2022 107    CO2 10/08/2022 23    Glucose, Bld 10/08/2022 76    BUN 10/08/2022 10    Creatinine, Ser 10/08/2022 1.27 (H)    Calcium 10/08/2022 8.3 (L)    Total Protein 10/08/2022 5.8 (L)    Albumin 10/08/2022 3.3 (L)    AST 10/08/2022 21    ALT 10/08/2022 19    Alkaline Phosphatase 10/08/2022 45    Total Bilirubin 10/08/2022 0.8    GFR, Estimated 10/08/2022 >60    Anion gap 10/08/2022 13    Valproic Acid Lvl 10/08/2022 80    Sodium 10/09/2022 139    Potassium 10/09/2022 3.6    Chloride 10/09/2022 109    CO2 10/09/2022 23    Glucose, Bld 10/09/2022 90    BUN 10/09/2022 9    Creatinine, Ser 10/09/2022 1.03    Calcium 10/09/2022 8.2 (L)    GFR, Estimated 10/09/2022 >60    Anion gap 10/09/2022 7   Office Visit on 08/08/2022  Component Date Value   WBC 08/08/2022 5.5  RBC 08/08/2022 4.58    Hemoglobin 08/08/2022 14.6    Hematocrit 08/08/2022 43.0     MCV 08/08/2022 94    MCH 08/08/2022 31.9    MCHC 08/08/2022 34.0    RDW 08/08/2022 12.9    Platelets 08/08/2022 300    Neutrophils 08/08/2022 63    Lymphs 08/08/2022 27    Monocytes 08/08/2022 8    Eos 08/08/2022 1    Basos 08/08/2022 1    Neutrophils Absolute 08/08/2022 3.5    Lymphocytes Absolute 08/08/2022 1.5    Monocytes Absolute 08/08/2022 0.4    EOS (ABSOLUTE) 08/08/2022 0.1    Basophils Absolute 08/08/2022 0.0    Immature Granulocytes 08/08/2022 0    Immature Grans (Abs) 08/08/2022 0.0    IgG (Immunoglobin G), Se* 08/08/2022 1,112    IgA/Immunoglobulin A, Se* 08/08/2022 133    IgM (Immunoglobulin M), * 08/08/2022 20    Vit D, 25-Hydroxy 08/08/2022 40.9   Admission on 06/13/2022, Discharged on 06/16/2022  Component Date Value   Glucose-Capillary 06/13/2022 108 (H)    WBC 06/13/2022 14.8 (H)    RBC 06/13/2022 4.33    Hemoglobin 06/13/2022 13.7    HCT 06/13/2022 43.3    MCV 06/13/2022 100.0    MCH 06/13/2022 31.6    MCHC 06/13/2022 31.6    RDW 06/13/2022 13.8    Platelets 06/13/2022 268    nRBC 06/13/2022 0.0    Neutrophils Relative % 06/13/2022 93    Neutro Abs 06/13/2022 13.9 (H)    Lymphocytes Relative 06/13/2022 3    Lymphs Abs 06/13/2022 0.5 (L)    Monocytes Relative 06/13/2022 3    Monocytes Absolute 06/13/2022 0.4    Eosinophils Relative 06/13/2022 0    Eosinophils Absolute 06/13/2022 0.0    Basophils Relative 06/13/2022 0    Basophils Absolute 06/13/2022 0.0    Immature Granulocytes 06/13/2022 1    Abs Immature Granulocytes 06/13/2022 0.07    Sodium 06/13/2022 138    Potassium 06/13/2022 4.1    Chloride 06/13/2022 104    CO2 06/13/2022 21 (L)    Glucose, Bld 06/13/2022 98    BUN 06/13/2022 7    Creatinine, Ser 06/13/2022 1.01    Calcium 06/13/2022 8.7 (L)    Total Protein 06/13/2022 7.1    Albumin 06/13/2022 4.2    AST 06/13/2022 43 (H)    ALT 06/13/2022 20    Alkaline Phosphatase 06/13/2022 65    Total Bilirubin 06/13/2022 0.7    GFR,  Estimated 06/13/2022 >60    Anion gap 06/13/2022 13    Lactic Acid, Venous 06/13/2022 2.7 (HH)    Lactic Acid, Venous 06/13/2022 2.3 (HH)    Opiates 06/13/2022 NONE DETECTED    Cocaine 06/13/2022 NONE DETECTED    Benzodiazepines 06/13/2022 POSITIVE (A)    Amphetamines 06/13/2022 NONE DETECTED    Tetrahydrocannabinol 06/13/2022 POSITIVE (A)    Barbiturates 06/13/2022 NONE DETECTED    Magnesium 06/13/2022 1.8    Levetiracetam Lvl 06/13/2022 <2.0 (L)    pH, Arterial 06/13/2022 7.263 (L)    pCO2 arterial 06/13/2022 51.3 (H)    pO2, Arterial 06/13/2022 510 (H)    Bicarbonate 06/13/2022 23.1    TCO2 06/13/2022 25    O2 Saturation 06/13/2022 100    Acid-base deficit 06/13/2022 4.0 (H)    Sodium 06/13/2022 139    Potassium 06/13/2022 3.7    Calcium, Ion 06/13/2022 1.12 (L)    HCT 06/13/2022 37.0 (L)    Hemoglobin 06/13/2022 12.6 (L)    Patient temperature 06/13/2022 99.5  F    Collection site 06/13/2022 RADIAL, ALLEN'S TEST ACCEPTABLE    Drawn by 06/13/2022 RT    Sample type 06/13/2022 ARTERIAL    Triglycerides 06/14/2022 153 (H)    MRSA by PCR Next Gen 06/13/2022 NOT DETECTED    Glucose-Capillary 06/13/2022 93    WBC 06/14/2022 7.8    RBC 06/14/2022 3.97 (L)    Hemoglobin 06/14/2022 12.4 (L)    HCT 06/14/2022 37.5 (L)    MCV 06/14/2022 94.5    MCH 06/14/2022 31.2    MCHC 06/14/2022 33.1    RDW 06/14/2022 13.8    Platelets 06/14/2022 240    nRBC 06/14/2022 0.0    Sodium 06/14/2022 141    Potassium 06/14/2022 3.6    Chloride 06/14/2022 107    CO2 06/14/2022 24    Glucose, Bld 06/14/2022 97    BUN 06/14/2022 6    Creatinine, Ser 06/14/2022 0.95    Calcium 06/14/2022 8.4 (L)    GFR, Estimated 06/14/2022 >60    Anion gap 06/14/2022 10    Magnesium 06/14/2022 2.0    Glucose-Capillary 06/13/2022 92    Glucose-Capillary 06/14/2022 95    Glucose-Capillary 06/14/2022 113 (H)    Glucose-Capillary 06/14/2022 95    WBC 06/15/2022 8.5    RBC 06/15/2022 4.00 (L)    Hemoglobin  06/15/2022 12.7 (L)    HCT 06/15/2022 38.1 (L)    MCV 06/15/2022 95.3    MCH 06/15/2022 31.8    MCHC 06/15/2022 33.3    RDW 06/15/2022 13.6    Platelets 06/15/2022 212    nRBC 06/15/2022 0.0    Sodium 06/15/2022 141    Potassium 06/15/2022 3.1 (L)    Chloride 06/15/2022 106    CO2 06/15/2022 24    Glucose, Bld 06/15/2022 100 (H)    BUN 06/15/2022 9    Creatinine, Ser 06/15/2022 0.99    Calcium 06/15/2022 8.4 (L)    GFR, Estimated 06/15/2022 >60    Anion gap 06/15/2022 11    Glucose-Capillary 06/14/2022 122 (H)    Glucose-Capillary 06/15/2022 105 (H)    Sodium 06/16/2022 142    Potassium 06/16/2022 3.9    Chloride 06/16/2022 109    CO2 06/16/2022 22    Glucose, Bld 06/16/2022 100 (H)    BUN 06/16/2022 9    Creatinine, Ser 06/16/2022 1.06    Calcium 06/16/2022 8.7 (L)    GFR, Estimated 06/16/2022 >60    Anion gap 06/16/2022 11    Magnesium 06/16/2022 2.0   Office Visit on 06/03/2022  Component Date Value   WBC 06/03/2022 7.7    RBC 06/03/2022 4.63    Hemoglobin 06/03/2022 14.6    HCT 06/03/2022 42.7    MCV 06/03/2022 92.2    MCH 06/03/2022 31.5    MCHC 06/03/2022 34.2    RDW 06/03/2022 13.1    Platelets 06/03/2022 278    MPV 06/03/2022 10.6    Neutro Abs 06/03/2022 5,198    Lymphs Abs 06/03/2022 1,786    Absolute Monocytes 06/03/2022 624    Eosinophils Absolute 06/03/2022 54    Basophils Absolute 06/03/2022 39    Neutrophils Relative % 06/03/2022 67.5    Total Lymphocyte 06/03/2022 23.2    Monocytes Relative 06/03/2022 8.1    Eosinophils Relative 06/03/2022 0.7    Basophils Relative 06/03/2022 0.5    Glucose, Bld 06/03/2022 92    BUN 06/03/2022 14    Creat 06/03/2022 1.01    BUN/Creatinine Ratio 06/03/2022 SEE NOTE:    Sodium 06/03/2022 136  Potassium 06/03/2022 4.0    Chloride 06/03/2022 101    CO2 06/03/2022 24    Calcium 06/03/2022 9.7    Total Protein 06/03/2022 7.3    Albumin 06/03/2022 4.6    Globulin 06/03/2022 2.7    AG Ratio 06/03/2022 1.7     Total Bilirubin 06/03/2022 0.5    Alkaline phosphatase (AP* 06/03/2022 74    AST 06/03/2022 13    ALT 06/03/2022 15    Cholesterol 06/03/2022 199    HDL 06/03/2022 69    Triglycerides 06/03/2022 77    LDL Cholesterol (Calc) 06/03/2022 113 (H)    Total CHOL/HDL Ratio 06/03/2022 2.9    Non-HDL Cholesterol (Cal* 06/03/2022 130 (H)    Vit D, 25-Hydroxy 06/03/2022 33    Keppra (Levetiracetam) 06/03/2022 <2.0 (L)   Admission on 04/25/2022, Discharged on 04/28/2022  Component Date Value   Sodium 04/25/2022 137    Potassium 04/25/2022 3.9    Chloride 04/25/2022 100    CO2 04/25/2022 25    Glucose, Bld 04/25/2022 114 (H)    BUN 04/25/2022 5 (L)    Creatinine, Ser 04/25/2022 0.96    Calcium 04/25/2022 8.7 (L)    Total Protein 04/25/2022 6.7    Albumin 04/25/2022 3.9    AST 04/25/2022 62 (H)    ALT 04/25/2022 59 (H)    Alkaline Phosphatase 04/25/2022 82    Total Bilirubin 04/25/2022 0.5    GFR, Estimated 04/25/2022 >60    Anion gap 04/25/2022 12    Glucose-Capillary 04/25/2022 129 (H)    WBC 04/25/2022 17.6 (H)    RBC 04/25/2022 4.42    Hemoglobin 04/25/2022 14.3    HCT 04/25/2022 43.1    MCV 04/25/2022 97.5    MCH 04/25/2022 32.4    MCHC 04/25/2022 33.2    RDW 04/25/2022 13.1    Platelets 04/25/2022 237    nRBC 04/25/2022 0.0    Neutrophils Relative % 04/25/2022 91    Neutro Abs 04/25/2022 16.0 (H)    Lymphocytes Relative 04/25/2022 5    Lymphs Abs 04/25/2022 0.9    Monocytes Relative 04/25/2022 3    Monocytes Absolute 04/25/2022 0.5    Eosinophils Relative 04/25/2022 0    Eosinophils Absolute 04/25/2022 0.0    Basophils Relative 04/25/2022 0    Basophils Absolute 04/25/2022 0.0    Immature Granulocytes 04/25/2022 1    Abs Immature Granulocytes 04/25/2022 0.09 (H)    Opiates 04/26/2022 NONE DETECTED    Cocaine 04/26/2022 NONE DETECTED    Benzodiazepines 04/26/2022 NONE DETECTED    Amphetamines 04/26/2022 NONE DETECTED    Tetrahydrocannabinol 04/26/2022 POSITIVE (A)     Barbiturates 04/26/2022 NONE DETECTED    Levetiracetam Lvl 04/25/2022 <2.0 (L)    Specimen Source 04/26/2022 URINE, CLEAN CATCH    Color, Urine 04/26/2022 YELLOW    APPearance 04/26/2022 HAZY (A)    Specific Gravity, Urine 04/26/2022 1.027    pH 04/26/2022 7.0    Glucose, UA 04/26/2022 NEGATIVE    Hgb urine dipstick 04/26/2022 NEGATIVE    Bilirubin Urine 04/26/2022 NEGATIVE    Ketones, ur 04/26/2022 NEGATIVE    Protein, ur 04/26/2022 30 (A)    Nitrite 04/26/2022 NEGATIVE    Leukocytes,Ua 04/26/2022 NEGATIVE    RBC / HPF 04/26/2022 0-5    WBC, UA 04/26/2022 0-5    Bacteria, UA 04/26/2022 NONE SEEN    Squamous Epithelial / HPF 04/26/2022 0-5    Mucus 04/26/2022 PRESENT    SARS Coronavirus 2 by RT* 04/25/2022 NEGATIVE    Influenza A  by PCR 04/25/2022 NEGATIVE    Influenza B by PCR 04/25/2022 NEGATIVE    Resp Syncytial Virus by * 04/25/2022 NEGATIVE    HIV Screen 4th Generatio* 04/26/2022 Non Reactive    WBC 04/26/2022 15.3 (H)    RBC 04/26/2022 3.85 (L)    Hemoglobin 04/26/2022 12.3 (L)    HCT 04/26/2022 37.8 (L)    MCV 04/26/2022 98.2    MCH 04/26/2022 31.9    MCHC 04/26/2022 32.5    RDW 04/26/2022 13.2    Platelets 04/26/2022 220    nRBC 04/26/2022 0.0    Sodium 04/26/2022 137    Potassium 04/26/2022 3.5    Chloride 04/26/2022 104    CO2 04/26/2022 25    Glucose, Bld 04/26/2022 107 (H)    BUN 04/26/2022 6    Creatinine, Ser 04/26/2022 0.87    Calcium 04/26/2022 8.2 (L)    Total Protein 04/26/2022 5.7 (L)    Albumin 04/26/2022 3.2 (L)    AST 04/26/2022 50 (H)    ALT 04/26/2022 56 (H)    Alkaline Phosphatase 04/26/2022 65    Total Bilirubin 04/26/2022 0.4    GFR, Estimated 04/26/2022 >60    Anion gap 04/26/2022 8    Sodium 04/27/2022 136    Potassium 04/27/2022 3.5    Chloride 04/27/2022 101    CO2 04/27/2022 22    Glucose, Bld 04/27/2022 99    BUN 04/27/2022 9    Creatinine, Ser 04/27/2022 0.93    Calcium 04/27/2022 8.7 (L)    Total Protein 04/27/2022 6.4 (L)     Albumin 04/27/2022 3.7    AST 04/27/2022 42 (H)    ALT 04/27/2022 56 (H)    Alkaline Phosphatase 04/27/2022 65    Total Bilirubin 04/27/2022 0.8    GFR, Estimated 04/27/2022 >60    Anion gap 04/27/2022 13    WBC 04/27/2022 8.3    RBC 04/27/2022 4.20 (L)    Hemoglobin 04/27/2022 13.5    HCT 04/27/2022 39.9    MCV 04/27/2022 95.0    MCH 04/27/2022 32.1    MCHC 04/27/2022 33.8    RDW 04/27/2022 12.7    Platelets 04/27/2022 179    nRBC 04/27/2022 0.0    Magnesium 04/27/2022 1.9   Office Visit on 01/05/2022  Component Date Value   IgG (Immunoglobin G), Se* 01/05/2022 1,188    IgA/Immunoglobulin A, Se* 01/05/2022 137    IgM (Immunoglobulin M), * 01/05/2022 20    WBC 01/05/2022 6.0    RBC 01/05/2022 5.32    Hemoglobin 01/05/2022 16.9    Hematocrit 01/05/2022 49.2    MCV 01/05/2022 93    MCH 01/05/2022 31.8    MCHC 01/05/2022 34.3    RDW 01/05/2022 13.5    Platelets 01/05/2022 257    Neutrophils 01/05/2022 65    Lymphs 01/05/2022 26    Monocytes 01/05/2022 7    Eos 01/05/2022 1    Basos 01/05/2022 1    Neutrophils Absolute 01/05/2022 3.9    Lymphocytes Absolute 01/05/2022 1.6    Monocytes Absolute 01/05/2022 0.4    EOS (ABSOLUTE) 01/05/2022 0.1    Basophils Absolute 01/05/2022 0.0    Immature Granulocytes 01/05/2022 0    Immature Grans (Abs) 01/05/2022 0.0    No image results found.   MR BRAIN W WO CONTRAST  Result Date: 11/15/2022 CLINICAL DATA:  Multiple sclerosis. EXAM: MRI HEAD WITHOUT AND WITH CONTRAST TECHNIQUE: Multiplanar, multiecho pulse sequences of the brain and surrounding structures were obtained without and with intravenous contrast. CONTRAST:  6mL GADAVIST GADOBUTROL 1 MMOL/ML IV SOLN COMPARISON:  MRI brain 01/26/2022. FINDINGS: Brain: Unchanged T2 hyperintense lesions in the juxtacortical, periventricular, and infratentorial white matter, compatible with known diagnosis of multiple sclerosis. No enhancement to suggest acute demyelination. No acute infarct  or hemorrhage. No mass or abnormal enhancement. No hydrocephalus or extra-axial collection. No foci of abnormal susceptibility. Vascular: Normal flow voids and vessel enhancement. Skull and upper cervical spine: Normal marrow signal and enhancement. Sinuses/Orbits: No acute findings. Other: None. IMPRESSION: Stable white matter lesions in the juxtacortical, periventricular, and infratentorial white matter, compatible with known diagnosis of multiple sclerosis. No enhancement to suggest acute demyelination. Electronically Signed   By: Orvan Falconer M.D.   On: 11/15/2022 08:35   Overnight EEG with video  Result Date: 11/14/2022 Charlsie Quest, MD     11/15/2022 11:12 AM Patient Name: RICARDO HOLLIE MRN: [id number] Epilepsy Attending: Charlsie Quest Referring Physician/Provider: Erick Blinks, MD Duration: 10/14/2022 1224 to 10/15/2022 1259  Patient history:  32 y.o. male with PMH significant for RRMS, epilepsy with medication noncompliance who presents with seizure at home, got 7.5mg  of Versed per EMS. Now appears post ictal and sedated. Got loaded with Keppra 1.5g and since then, has not had any clinical seizures. Concern for aphasia out of proportion to encephalopathy. EEG to evaluate for seizure  Level of alertness: Awake, asleep  AEDs during EEG study: LTG, VPA  Technical aspects: This EEG study was done with scalp electrodes positioned according to the 10-20 International system of electrode placement. Electrical activity was reviewed with band pass filter of 1-70Hz , sensitivity of 7 uV/mm, display speed of 42mm/sec with a 60Hz  notched filter applied as appropriate. EEG data were recorded continuously and digitally stored.  Video monitoring was available and reviewed as appropriate.  Description: The posterior dominant rhythm consists of 8-9 Hz activity of moderate voltage (25-35 uV) seen predominantly in posterior head regions, symmetric and reactive to eye opening and eye closing. Sleep was  characterized by vertex waves, sleep spindles (12 to 14 Hz), maximal frontocentral region. EEG showed intermittent generalized polymorphic 3  to 5 Hz theta-delta slowing. Frequent sharp waves were noted in LEFt fronto-temporal region. Hyperventilation and photic stimulation were not performed.   Of note, parts of study were difficult to interpret due to significant electrode artifact.  ABNORMALITY -Sharp wave, LEFT fronto-temporal region - Intermittent slow, generalized  IMPRESSION: This study showed evidence of epileptogenicity arising from LEFT fronto-temporal region. Additionally there is mild to moderate diffuse encephalopathy. No seizures were seen throughout the recording.  Charlsie Quest   DG Chest Port 1 View  Result Date: 11/12/2022 CLINICAL DATA:  Seizure EXAM: PORTABLE CHEST 1 VIEW COMPARISON:  10/08/2022 FINDINGS: The heart size and mediastinal contours are within normal limits. Small nodular density projecting over the right lung base, likely nipple shadow. Lungs are otherwise clear. No pleural effusion or pneumothorax. The visualized skeletal structures are unremarkable. IMPRESSION: 1. No acute cardiopulmonary findings. 2. Small nodular density projecting over the right lung base, likely nipple shadow. Repeat radiograph with nipple markers could be performed to confirm. Electronically Signed   By: Duanne Guess D.O.   On: 11/12/2022 17:57   CT Head Wo Contrast  Result Date: 11/12/2022 CLINICAL DATA:  Seizure disorder, clinical change EXAM: CT HEAD WITHOUT CONTRAST TECHNIQUE: Contiguous axial images were obtained from the base of the skull through the vertex without intravenous contrast. RADIATION DOSE REDUCTION: This exam was performed according to the departmental dose-optimization program which includes automated exposure  control, adjustment of the mA and/or kV according to patient size and/or use of iterative reconstruction technique. COMPARISON:  Head CT 10/06/2022 FINDINGS: Brain:  No evidence of acute infarction, hemorrhage, hydrocephalus, extra-axial collection or mass lesion/mass effect. Low densities in the cerebral white matter related to known diagnosis of multiple sclerosis, unchanged in CT appearance. Vascular: No hyperdense vessel or unexpected calcification. Skull: No fracture or focal lesion. Sinuses/Orbits: No acute finding. Other: None. IMPRESSION: 1. No acute intracranial abnormality. 2. Low densities in the cerebral white matter related to known diagnosis of multiple sclerosis, unchanged in CT appearance. Electronically Signed   By: Narda Rutherford M.D.   On: 11/12/2022 17:54   DG CHEST PORT 1 VIEW  Result Date: 10/08/2022 CLINICAL DATA:  [id number] PNA (pneumonia) [id number] EXAM: PORTABLE CHEST - 1 VIEW COMPARISON:  06/13/2022 FINDINGS: Cardiac silhouette is unremarkable. No pneumothorax or pleural effusion. The lungs are clear. The visualized skeletal structures are unremarkable. IMPRESSION: No acute cardiopulmonary process. Electronically Signed   By: Layla Maw M.D.   On: 10/08/2022 14:15   Overnight EEG with video  Result Date: 10/08/2022 Charlsie Quest, MD     10/08/2022  8:56 AM Patient Name: NYSIR KRISHNAMOORTHY MRN: [id number] Epilepsy Attending: Charlsie Quest Referring Physician/Provider: Erick Blinks, MD Duration: 10/07/2022 3875 to 10/08/2022 6433 Patient history:  32 y.o. male with PMH significant for RRMS, epilepsy with medication noncompliance who presents with seizure at home, got 7.5mg  of Versed per EMS. Now appears post ictal and sedated. Got loaded with Keppra 1.5g and since then, has not had any clinical seizures. Concern for aphasia out of proportion to encephalopathy. EEG to evaluate for seizure Level of alertness: Awake, asleep AEDs during EEG study: LEV Technical aspects: This EEG study was done with scalp electrodes positioned according to the 10-20 International system of electrode placement. Electrical activity was reviewed with band pass  filter of 1-70Hz , sensitivity of 7 uV/mm, display speed of 75mm/sec with a 60Hz  notched filter applied as appropriate. EEG data were recorded continuously and digitally stored.  Video monitoring was available and reviewed as appropriate. Description: The posterior dominant rhythm consists of 8-9 Hz activity of moderate voltage (25-35 uV) seen predominantly in posterior head regions, symmetric and reactive to eye opening and eye closing. Sleep was characterized by vertex waves, sleep spindles (12 to 14 Hz), maximal frontocentral region. EEG showed intermittent generalized polymorphic 3  to 5 Hz theta-delta slowing. Frequent sharp waves were noted in right fronto-temporal region. Hyperventilation and photic stimulation were not performed.   ABNORMALITY -Sharp wave, right fronto-temporal region - Intermittent slow, generalized IMPRESSION: This study showed evidence of epileptogenicity arising from  right fronto-temporal region. Additionally there is moderate diffuse encephalopathy. No seizures were seen throughout the recording. Priyanka [name]   CT ANGIO HEAD NECK W WO CM  Result Date: 10/07/2022 CLINICAL DATA:  Acute stroke suspected EXAM: CT ANGIOGRAPHY HEAD AND NECK WITH AND WITHOUT CONTRAST TECHNIQUE: Multidetector CT imaging of the head and neck was performed using the standard protocol during bolus administration of intravenous contrast. Multiplanar CT image reconstructions and MIPs were obtained to evaluate the vascular anatomy. Carotid stenosis measurements (when applicable) are obtained utilizing NASCET criteria, using the distal internal carotid diameter as the denominator. RADIATION DOSE REDUCTION: This exam was performed according to the departmental dose-optimization program which includes automated exposure control, adjustment of the mA and/or kV according to patient size and/or use of iterative reconstruction technique. CONTRAST:  75mL OMNIPAQUE IOHEXOL 350 MG/ML SOLN COMPARISON:  Head  CT from  yesterday FINDINGS: CT HEAD FINDINGS Brain: No evidence of acute infarction, hemorrhage, hydrocephalus, extra-axial collection or mass lesion/mass effect. Low densities in the cerebral white matter from multiple sclerosis by prior imaging. These are underestimated relative to brain MRI earlier this year. Vascular: No hyperdense vessel or unexpected calcification. Skull: Normal. Negative for fracture or focal lesion. Sinuses/Orbits: No acute finding. Review of the MIP images confirms the above findings CTA NECK FINDINGS Aortic arch: Normal with 3 vessel branching Right carotid system: Mild right ICA indentation due to stylohyoid ligament ossification. No stenosis, beading, or ulceration Left carotid system: Vessels are smoothly contoured and widely patent. No atheromatous changes Vertebral arteries: No proximal subclavian or vertebral stenosis. No atheromatous changes. Skeleton: Unremarkable Other neck: Unremarkable Upper chest: Clear apical lungs Review of the MIP images confirms the above findings CTA HEAD FINDINGS Anterior circulation: No significant stenosis, proximal occlusion, aneurysm, or vascular malformation. Posterior circulation: No significant stenosis, proximal occlusion, aneurysm, or vascular malformation. Venous sinuses: As permitted by contrast timing, patent. Review of the MIP images confirms the above findings IMPRESSION: Negative CTA.  No explanation for symptoms. Electronically Signed   By: Tiburcio Pea M.D.   On: 10/07/2022 05:36   CT Head Wo Contrast  Result Date: 10/06/2022 CLINICAL DATA:  Altered mental status EXAM: CT HEAD WITHOUT CONTRAST TECHNIQUE: Contiguous axial images were obtained from the base of the skull through the vertex without intravenous contrast. RADIATION DOSE REDUCTION: This exam was performed according to the departmental dose-optimization program which includes automated exposure control, adjustment of the mA and/or kV according to patient size and/or use of  iterative reconstruction technique. COMPARISON:  06/13/2022 FINDINGS: Brain: No evidence of acute infarction, hemorrhage, hydrocephalus, extra-axial collection or mass lesion/mass effect. Unchanged periventricular white matter hypoattenuation, worst in the left periatrial region. Vascular: No hyperdense vessel or unexpected calcification. Skull: Normal. Negative for fracture or focal lesion. Sinuses/Orbits: No acute finding. Other: None. IMPRESSION: 1. No acute intracranial abnormality. 2. Unchanged periventricular white matter hypoattenuation, worst in the left periatrial region. This is in keeping with the reported diagnosis of multiple sclerosis. Electronically Signed   By: Deatra Robinson M.D.   On: 10/06/2022 23:55    DG Chest Port 1 View  Result Date: 11/12/2022 CLINICAL DATA:  Seizure EXAM: PORTABLE CHEST 1 VIEW COMPARISON:  10/08/2022 FINDINGS: The heart size and mediastinal contours are within normal limits. Small nodular density projecting over the right lung base, likely nipple shadow. Lungs are otherwise clear. No pleural effusion or pneumothorax. The visualized skeletal structures are unremarkable. IMPRESSION: 1. No acute cardiopulmonary findings. 2. Small nodular density projecting over the right lung base, likely nipple shadow. Repeat radiograph with nipple markers could be performed to confirm. Electronically Signed   By: Duanne Guess D.O.   On: 11/12/2022 17:57   CT Head Wo Contrast  Result Date: 11/12/2022 CLINICAL DATA:  Seizure disorder, clinical change EXAM: CT HEAD WITHOUT CONTRAST TECHNIQUE: Contiguous axial images were obtained from the base of the skull through the vertex without intravenous contrast. RADIATION DOSE REDUCTION: This exam was performed according to the departmental dose-optimization program which includes automated exposure control, adjustment of the mA and/or kV according to patient size and/or use of iterative reconstruction technique. COMPARISON:  Head CT  10/06/2022 FINDINGS: Brain: No evidence of acute infarction, hemorrhage, hydrocephalus, extra-axial collection or mass lesion/mass effect. Low densities in the cerebral white matter related to known diagnosis of multiple sclerosis, unchanged in CT appearance. Vascular: No hyperdense vessel or unexpected calcification. Skull:  No fracture or focal lesion. Sinuses/Orbits: No acute finding. Other: None. IMPRESSION: 1. No acute intracranial abnormality. 2. Low densities in the cerebral white matter related to known diagnosis of multiple sclerosis, unchanged in CT appearance. Electronically Signed   By: Narda Rutherford M.D.   On: 11/12/2022 17:54       Additional Info: This encounter employed real-time, collaborative documentation. The patient actively reviewed and updated their medical record on a shared screen, ensuring transparency and facilitating joint problem-solving for the problem list, overview, and plan. This approach promotes accurate, informed care. The treatment plan was discussed and reviewed in detail, including medication safety, potential side effects, and all patient questions. We confirmed understanding and comfort with the plan. Follow-up instructions were established, including contacting the office for any concerns, returning if symptoms worsen, persist, or new symptoms develop, and precautions for potential emergency department visits.

## 2022-11-24 NOTE — Assessment & Plan Note (Signed)
He is experiencing frequent breakthrough seizures despite a maximized medication regimen, currently including Divalproex 500mg  BID and Lamotrigine 25mg  with a ramping dose, following the discontinuation of Keppra due to suicidal ideations. We will continue his current medication regimen, extend the Lamotrigine prescription to three months, and check Valproic acid levels due to interaction with Topiramate. An urgent referral to a new neurologist is necessary.  Valtoco nasal spray has been effective in managing his breakthrough seizures on the lowest dose. We will increase the dose of Valtoco nasal spray.  He recently received an Ocrevus infusion. We will continue the current treatment regimen.

## 2022-11-24 NOTE — Assessment & Plan Note (Signed)
We will consider obtaining a seizure dog and setting up seizure-detecting technology for safety. Increasing the CBD component of cannabis for potential seizure prevention is advisable. He should discontinue smoking cigars to avoid potential lung damage and risks associated with repeated intubation. A follow-up in one week is scheduled.

## 2022-11-24 NOTE — Assessment & Plan Note (Signed)
According to the patient this occurred as he was shifting off of Keppra due to suicidal ideation and the suicidal ideations have gone away and he has not had any further seizure activity since he was on board to valproic acid in the hospital initially with 750 mg twice daily now de-escalated to 500 mg twice daily and he is also on a ramping topiramate we will also resupply Valtoco nasal spray to ensure his safety and discussed early warning symptoms for seizures  We will check valproic acid levels

## 2022-12-05 ENCOUNTER — Ambulatory Visit: Payer: Medicare (Managed Care) | Admitting: Internal Medicine

## 2022-12-05 NOTE — Progress Notes (Unsigned)
[facility]: [phone]   -- Annual Preventive Medical Office Visit --  Patient:  Miguel Black      Age: 32 y.o.       Sex:  male  Date:   12/05/2022 Patient Care Team: Lula Olszewski, MD as PCP - General (Internal Medicine) Epimenio Foot, Pearletha Furl, MD (Neurology) Shawnie Dapper, NP as Nurse Practitioner (Neurology) Today's Healthcare Provider: Lula Olszewski, MD  ========================================= Chief Complaint  Patient presents with   Annual Exam    Purpose of Visit: Comprehensive preventive health assessment and personalized health maintenance planning.  This encounter was conducted as a Comprehensive Physical Exam (CPE) preventive care annual visit. The patient's medical history and problem list were reviewed to inform individualized preventive care recommendations.   No problem-specific medical treatment was provided during this visit.    Assessment & Plan Encounter for annual health examination  Encounter for annual general medical examination with abnormal findings in adult  Preventative health care  Healthcare maintenance  Vitamin D deficiency  Seizure disorder (HCC)  multiple sclerosis  Leukocytosis, unspecified type  Left-sided weakness  Gait disturbance  Elevated AST (SGOT)  Ataxia  Ambulates with cane  Marijuana use  Assessment and Plan             Diagnoses and all orders for this visit: Encounter for annual health examination Encounter for annual general medical examination with abnormal findings in adult Preventative health care Healthcare maintenance Vitamin D deficiency Seizure disorder (HCC) multiple sclerosis Leukocytosis, unspecified type Left-sided weakness Gait disturbance Elevated AST (SGOT) Ataxia Ambulates with cane   Today's Health Maintenance Counseling and Anticipatory Guidance:  Eye exams:  We encouraged patient to to complete eye exam every 1-2 years.Dental health:  He reports he  has been keeping up with his dental visits.  He intends to do so in the future.  We encourage regular tooth brushing/flossing. Sinus health:  We encouraged sterile saline nasal misting sinus rinses daily for pollen, to reduce allergies and risk for sinus infections.   Sterile can based misting products are recommended due to superior misting and ease of maintaining sterility  Sleep Apnea screening:  We encourage self monitoring of sleep quality with SnoreLab App and other tools/apps that are now available at retail []  Do you snore loudly []  Tired, fatigued, or sleepy during the daytime []  Witness apneas []  Significant hypertension []  BMI greater than 35    []  Age older than 31 years old []  Has large neck size over 15.7 in [x]  Male Total Score: 1  Cardiovascular Risk Factor Reduction:   Advised patient of need for regular exercise and diet rich and fruits and vegetables and healthy fats to reduce risk of heart attack and stroke.  Avoid first- and second-hand smoke and stimulants.   Avoid extreme exercise- exercise in moderation (150 minutes per week is a good goal) Discussed health benefits of physical activity, and encouraged him to engage in regular exercise appropriate for his age and condition. Exercise Activities and Dietary recommendations  Goals   None    Wt Readings from Last 3 Encounters:  11/24/22 163 lb 6.4 oz (74.1 kg)  11/12/22 136 lb 11 oz (62 kg)  10/06/22 143 lb 4.8 oz (65 kg)  There is no height or weight on file to calculate BMI. Health maintenance and immunizations reviewed and he was encouraged to complete anything that is due: Immunization History  Administered Date(s) Administered   Pneumococcal Polysaccharide-23 05/01/2013   Tdap 05/01/2013   Health  Maintenance Due  Topic Date Due   Medicare Annual Wellness (AWV)  Never done   Hepatitis C Screening  Never done    Health Maintenance  Topic Date Due   Medicare Annual Wellness (AWV)  Never done   Hepatitis C  Screening  Never done   INFLUENZA VACCINE  04/24/2023 (Originally 08/25/2022)   DTaP/Tdap/Td (2 - Td or Tdap) 05/02/2023   HIV Screening  Completed   HPV VACCINES  Aged Out   COVID-19 Vaccine  Discontinued    Lifestyle / Social History Reviewed:  Social History   Tobacco Use   Smoking status: Some Days    Types: Cigars   Smokeless tobacco: Never   Tobacco comments:    2.5-3 per day  Vaping Use   Vaping status: Never Used  Substance Use Topics   Alcohol use: Yes    Comment: sometimes    Drug use: Yes    Types: Marijuana    Comment: Reports he smokes a lot, cannot put a number to it    My recommendation is total abstinence from all substances of abuse including smoke and 2nd hand smoke, alcohol, illicit drugs, smoking, inhalants, sugar, high risk sexual behavior Offered to assist with any use disorders or addictions.   Re cannabis which he uses for seizure disorder(s) I advised patient to try to increase cannabidiol (CBD) ratio and avoid inhaling smoke. Sexual transmitted infection testing offered today, but patient declined as he feels he is low risk based on his sexual history.   Social History   Substance and Sexual Activity  Sexual Activity Not Currently  .      06/03/2022    3:07 PM  Depression screen PHQ 2/9  Decreased Interest 0  Down, Depressed, Hopeless 0  PHQ - 2 Score 0  Altered sleeping 0  Tired, decreased energy 1  Change in appetite 0  Feeling bad or failure about yourself  0  Trouble concentrating 0  Moving slowly or fidgety/restless 1  Suicidal thoughts 0  PHQ-9 Score 2  Difficult doing work/chores Very difficult   Injury prevention: Discussed safety belts, safety helmets, not texting and driving.  Today's Cancer Screening  Penile/Testicle/Scrotum cancer screening: Asked the patient about genital warts or tumors/abnormalities of penis/testicles/scrotum, encouraged patient to to inform me of any. Patient reports there are none. Thyroid cancer screening:  patient advised to check by palpating thyroid for nodules Prostate cancer screening:  Denies family history of prostate cancer or hematospermia so too young for screening by current guidelines.(No results found for: "PSA") Colon cancer screening:   Denies strong family history of colon cancer or blood in stool so no screening is indicated until age 65.   Lung cancer screening:  Current guidelines recommend Individuals aged 55 to 72 who currently smoke or formerly smoked and have a >= 20 pack-year smoking history should undergo annual screening with low-dose computed tomography (LDCT).  Cannabis is not considered in this calculation. Skin cancer screening-  Advised regular sunscreen use. He denies worrisome, changing, or new skin lesions. Showed him pictures of melanomas for reference:   Return to care in 1 year for next preventative visit.      Subjective  32 y.o. male presents today for a complete physical exam.  He reports consuming a general diet. The patient does not participate in regular exercise at present. He generally feels well. He reports sleeping well. He does not have additional problems to discuss today.  HPI  AI-Extracted: Discussed the use of AI scribe  software for clinical note transcription with the patient, who gave verbal consent to proceed.  History of Present Illness          ROSA comprehensive ROS was negative for any concerning symptoms.  Disclaimer about ROS at Annual Preventive Visits Patients are informed before the Review of Systems (ROS) that identifying significant medical issues during the wellness visit may require immediate attention, potentially resulting in a separate billable encounter beyond the scope of the preventive exam. This disclosure is mandated by professional ethics and legal obligations, as healthcare providers must address any substantial health concerns raised during any patient interaction.  A comprehensive ROS is required by insurance companies  for billing the visit. However, this structure may inadvertently discourage patients from fully disclosing health concerns due to potential financial implications. Consequently, patients often emphasize that any positive ROS findings are related to stable chronic conditions, requesting that these not be discussed during the preventive visit to avoid additional charges. Patients may also ask that reported complaints not be listed in the ROS to prevent affecting billing.  Problem list overviews that were updated at today's visit: No problems updated. I attest that I have reviewed and confirmed the patients current medications to meet the medication reconciliation requirement  Current Outpatient Medications on File Prior to Visit  Medication Sig   diazePAM, 15 MG Dose, (VALTOCO 15 MG DOSE) 2 x 7.5 MG/0.1ML LQPK Place 1 Dose into the nose daily as needed.   divalproex (DEPAKOTE) 500 MG DR tablet Take 1 tablet (500 mg total) by mouth every 12 (twelve) hours.   lamoTRIgine (LAMICTAL) 25 MG tablet Take 1 tablet (25 mg total) by mouth every other day for 12 days, THEN 1 tablet (25 mg total) daily for 14 days, THEN 2 tablets (50 mg total) daily for 7 days.   ocrelizumab (OCREVUS) 300 MG/10ML injection Inject 20 mLs (600 mg total) into the vein every 6 (six) months.   [START ON 12/14/2022] topiramate (TOPAMAX) 25 MG tablet Take 2 tablets (50 mg total) by mouth 2 (two) times daily.   No current facility-administered medications on file prior to visit.  There are no discontinued medications. Outpatient Medications Prior to Visit  Medication Sig   diazePAM, 15 MG Dose, (VALTOCO 15 MG DOSE) 2 x 7.5 MG/0.1ML LQPK Place 1 Dose into the nose daily as needed.   divalproex (DEPAKOTE) 500 MG DR tablet Take 1 tablet (500 mg total) by mouth every 12 (twelve) hours.   lamoTRIgine (LAMICTAL) 25 MG tablet Take 1 tablet (25 mg total) by mouth every other day for 12 days, THEN 1 tablet (25 mg total) daily for 14 days, THEN  2 tablets (50 mg total) daily for 7 days.   ocrelizumab (OCREVUS) 300 MG/10ML injection Inject 20 mLs (600 mg total) into the vein every 6 (six) months.   [START ON 12/14/2022] topiramate (TOPAMAX) 25 MG tablet Take 2 tablets (50 mg total) by mouth 2 (two) times daily.   No facility-administered medications prior to visit.  Review of Systems The following were reviewed and entered/updated into our electronic MEDICAL RECORD NUMBERPast Medical History:  Diagnosis Date   Acute encephalopathy 04/26/2022   Acute metabolic encephalopathy 04/26/2022   Acute respiratory failure with hypoxia (HCC) 08/23/2021   Aspiration pneumonia (HCC) 07/12/2018   Closed fracture of distal phalanx or phalanges of hand 02/05/2009   Formatting of this note might be different from the original. Overview: The original diagnosis was replaced automatically by the ICD-10 team to an ICD-10 compliant one.  Please review for accuracy   Encounter for screening for HIV 07/12/2018   Foot drop 06/03/2022   Has a left foot drop ever since 2015 first flare of multiple sclerosis had a brace in 2022 custom but does not actually do anything to lift the from the foot physical therapist advised him to get an alternative foot drop brace but I cannot find record of that   Marijuana abuse 05/01/2013   MS (multiple sclerosis) (HCC)    Seizure (HCC)    Seizures (HCC) 01/22/2019   Status epilepticus (HCC) 07/12/2018   Past Surgical History:  Procedure Laterality Date   NO PAST SURGERIES     Social History   Socioeconomic History   Marital status: Unknown    Spouse name: Not on file   Number of children: 0   Years of education: 11   Highest education level: Not on file  Occupational History   Occupation: Unemployed  Tobacco Use   Smoking status: Some Days    Types: Cigars   Smokeless tobacco: Never   Tobacco comments:    2.5-3 per day  Vaping Use   Vaping status: Never Used  Substance and Sexual Activity   Alcohol use: Yes     Comment: sometimes    Drug use: Yes    Types: Marijuana    Comment: Reports he smokes a lot, cannot put a number to it   Sexual activity: Not Currently  Other Topics Concern   Not on file  Social History Narrative   Left handed   Coffee every day   Lives with family    Social Determinants of Health   Financial Resource Strain: Not on file  Food Insecurity: No Food Insecurity (11/17/2022)   Hunger Vital Sign    Worried About Running Out of Food in the Last Year: Never true    Ran Out of Food in the Last Year: Never true  Transportation Needs: No Transportation Needs (11/17/2022)   PRAPARE - Administrator, Civil Service (Medical): No    Lack of Transportation (Non-Medical): No  Physical Activity: Not on file  Stress: Not on file  Social Connections: Unknown (10/13/2021)   Received from [facility]   Social Network    Social Network: Not on file  Intimate Partner Violence: Unknown (10/13/2021)   Received from [facility]   HITS    Physically Hurt: Not on file    Insult or Talk Down To: Not on file    Threaten Physical Harm: Not on file    Scream or Curse: Not on file   Family Status  Relation Name Status   Mother  (Not Specified)   Father  (Not Specified)   Sister  (Not Specified)   Brother  (Not Specified)   Brother  (Not Specified)   Nurse, mental health  (Not Specified)   MGM  Alive   MGF  Alive   PGM  Deceased   PGF  Alive  No partnership data on file   Family History  Problem Relation Age of Onset   Arthritis Mother    Hypertension Mother    Healthy Father    Healthy Sister    Healthy Brother    Hodgkin's lymphoma Brother    Colon cancer Maternal Uncle 45   Kidney disease Maternal Grandmother    Hypertension Maternal Grandmother    Arthritis Maternal Grandmother    Diabetes Maternal Grandmother    Stroke Maternal Grandfather    Hypertension Maternal  Grandfather    Diabetes Paternal Grandmother    Allergies   Allergen Reactions   Dalfampridine Other (See Comments)    Seizure 02/2021 while on dalfampridine   Patient Care Team: Lula Olszewski, MD as PCP - General (Internal Medicine) Sater, Pearletha Furl, MD (Neurology) Shawnie Dapper, NP as Nurse Practitioner (Neurology)      Objective  There were no vitals taken for this visit. {Insert last BP/Wt (optional):23777}{See vitals history (optional):1}  Physical Exam  Wt Readings from Last 3 Encounters:  11/24/22 163 lb 6.4 oz (74.1 kg)  11/12/22 136 lb 11 oz (62 kg)  10/06/22 143 lb 4.8 oz (65 kg)  Physical Exam         GEN: NAD, Resting Comfortably. HEENT: Tympanic membranes normal appearing bilaterally, Oropharynx clear, No Thyromegaly noted. No palpable lymphadenopathy or thyroid nodules. CARDIOVASCULAR: S1 and S2 heart sounds have regular rate and rhythm with no murmurs appreciated. PULMONARY:  Normal work of breathing. Clear to auscultation bilaterally with no crackles, wheezes, or rhonchi. ABDOMEN: Soft, Nontender, Nondistended.  MSK: No edema, cyanosis, or clubbing noted. SKIN: Warm, dry, no lesions of concern observed. NEURO: CN2-12 grossly intact. Strength 5/5 in upper and lower extremities. Reflexes symmetric and intact bilaterally.  PSYCH: Normal affect and thought content, pleasant and cooperative. Last depression screening scores    06/03/2022    3:07 PM 01/15/2020    2:34 PM 01/07/2019    2:32 PM  PHQ 2/9 Scores  PHQ - 2 Score 0 0 0  PHQ- 9 Score 2 0    Last fall risk screening    06/03/2022    3:06 PM  Fall Risk   Falls in the past year? 1  Number falls in past yr: 0  Comment due to seizure  Injury with Fall? 0  Risk for fall due to : Other (Comment)  Risk for fall due to: Comment fell back duriing seizure (when trying to get up).  Follow up Falls evaluation completed   Last Audit-C alcohol use screening     No data to display         A score of 3 or more in women, and 4 or more in men indicates increased  risk for alcohol abuse, EXCEPT if all of the points are from question 1  Last CBC Lab Results  Component Value Date   WBC 7.3 11/14/2022   HGB 13.7 11/14/2022   HCT 42.0 11/14/2022   MCV 96.6 11/14/2022   MCH 31.5 11/14/2022   RDW 13.2 11/14/2022   PLT 197 11/14/2022   Last metabolic panel Lab Results  Component Value Date   GLUCOSE 94 11/14/2022   NA 141 11/14/2022   K 4.1 11/14/2022   CL 110 11/14/2022   CO2 21 (L) 11/14/2022   BUN 14 11/14/2022   CREATININE 0.95 11/14/2022   GFRNONAA >60 11/14/2022   CALCIUM 8.2 (L) 11/14/2022   PHOS 3.5 11/14/2022   PROT 7.4 11/12/2022   ALBUMIN 4.0 11/12/2022   LABGLOB 2.3 07/22/2019   AGRATIO 2.1 07/22/2019   BILITOT 0.3 11/12/2022   ALKPHOS 64 11/12/2022   AST 24 11/12/2022   ALT 20 11/12/2022   ANIONGAP 10 11/14/2022   Last lipids Lab Results  Component Value Date   CHOL 199 06/03/2022   HDL 69 06/03/2022   LDLCALC 113 (H) 06/03/2022   TRIG 153 (H) 06/14/2022   CHOLHDL 2.9 06/03/2022   Last hemoglobin A1c Lab Results  Component Value Date   HGBA1C 5.5 08/22/2021  Last thyroid functions Lab Results  Component Value Date   TSH 0.625 01/22/2019   Last vitamin D Lab Results  Component Value Date   VD25OH 40.9 08/08/2022   Last vitamin B12 and Folate No results found for: "VITAMINB12", "FOLATE"   {See past labs  Heme  Chem  Endocrine  Serology  Results Review (optional):1}   Lula Olszewski, MD   [facility] [place] [phone] (phone) [phone] (fax) [facility]

## 2022-12-27 ENCOUNTER — Encounter: Payer: Medicare (Managed Care) | Admitting: Internal Medicine

## 2023-01-05 ENCOUNTER — Other Ambulatory Visit: Payer: Self-pay | Admitting: Internal Medicine

## 2023-01-05 DIAGNOSIS — G40909 Epilepsy, unspecified, not intractable, without status epilepticus: Secondary | ICD-10-CM

## 2023-01-05 DIAGNOSIS — G40919 Epilepsy, unspecified, intractable, without status epilepticus: Secondary | ICD-10-CM

## 2023-01-05 DIAGNOSIS — G35 Multiple sclerosis: Secondary | ICD-10-CM

## 2023-01-05 DIAGNOSIS — R569 Unspecified convulsions: Secondary | ICD-10-CM

## 2023-02-13 ENCOUNTER — Emergency Department (HOSPITAL_COMMUNITY): Payer: Medicare Other

## 2023-02-13 ENCOUNTER — Inpatient Hospital Stay (HOSPITAL_COMMUNITY)
Admission: EM | Admit: 2023-02-13 | Discharge: 2023-02-17 | DRG: 100 | Disposition: A | Discharge status: Home / Self Care | Payer: Medicare Other | Attending: Internal Medicine | Admitting: Internal Medicine

## 2023-02-13 ENCOUNTER — Inpatient Hospital Stay (HOSPITAL_COMMUNITY): Payer: Medicare Other

## 2023-02-13 DIAGNOSIS — Z823 Family history of stroke: Secondary | ICD-10-CM

## 2023-02-13 DIAGNOSIS — J9601 Acute respiratory failure with hypoxia: Secondary | ICD-10-CM | POA: Diagnosis present

## 2023-02-13 DIAGNOSIS — Z800 Family history of malignant neoplasm of digestive organs: Secondary | ICD-10-CM | POA: Diagnosis not present

## 2023-02-13 DIAGNOSIS — T17908A Unspecified foreign body in respiratory tract, part unspecified causing other injury, initial encounter: Secondary | ICD-10-CM | POA: Diagnosis not present

## 2023-02-13 DIAGNOSIS — F17290 Nicotine dependence, other tobacco product, uncomplicated: Secondary | ICD-10-CM | POA: Diagnosis present

## 2023-02-13 DIAGNOSIS — Z79899 Other long term (current) drug therapy: Secondary | ICD-10-CM

## 2023-02-13 DIAGNOSIS — Z807 Family history of other malignant neoplasms of lymphoid, hematopoietic and related tissues: Secondary | ICD-10-CM | POA: Diagnosis not present

## 2023-02-13 DIAGNOSIS — N179 Acute kidney failure, unspecified: Secondary | ICD-10-CM | POA: Diagnosis present

## 2023-02-13 DIAGNOSIS — R569 Unspecified convulsions: Secondary | ICD-10-CM

## 2023-02-13 DIAGNOSIS — G40901 Epilepsy, unspecified, not intractable, with status epilepticus: Secondary | ICD-10-CM | POA: Diagnosis present

## 2023-02-13 DIAGNOSIS — E559 Vitamin D deficiency, unspecified: Secondary | ICD-10-CM

## 2023-02-13 DIAGNOSIS — J690 Pneumonitis due to inhalation of food and vomit: Secondary | ICD-10-CM | POA: Diagnosis present

## 2023-02-13 DIAGNOSIS — R001 Bradycardia, unspecified: Secondary | ICD-10-CM | POA: Diagnosis not present

## 2023-02-13 DIAGNOSIS — L89152 Pressure ulcer of sacral region, stage 2: Secondary | ICD-10-CM | POA: Diagnosis present

## 2023-02-13 DIAGNOSIS — Z833 Family history of diabetes mellitus: Secondary | ICD-10-CM

## 2023-02-13 DIAGNOSIS — Z8261 Family history of arthritis: Secondary | ICD-10-CM | POA: Diagnosis not present

## 2023-02-13 DIAGNOSIS — E7220 Disorder of urea cycle metabolism, unspecified: Secondary | ICD-10-CM | POA: Diagnosis present

## 2023-02-13 DIAGNOSIS — I959 Hypotension, unspecified: Secondary | ICD-10-CM | POA: Diagnosis present

## 2023-02-13 DIAGNOSIS — Z91148 Patient's other noncompliance with medication regimen for other reason: Secondary | ICD-10-CM | POA: Diagnosis not present

## 2023-02-13 DIAGNOSIS — Z8249 Family history of ischemic heart disease and other diseases of the circulatory system: Secondary | ICD-10-CM | POA: Diagnosis not present

## 2023-02-13 DIAGNOSIS — D849 Immunodeficiency, unspecified: Secondary | ICD-10-CM | POA: Diagnosis present

## 2023-02-13 DIAGNOSIS — E876 Hypokalemia: Secondary | ICD-10-CM | POA: Diagnosis present

## 2023-02-13 DIAGNOSIS — G35 Multiple sclerosis: Secondary | ICD-10-CM | POA: Diagnosis present

## 2023-02-13 DIAGNOSIS — R0689 Other abnormalities of breathing: Secondary | ICD-10-CM | POA: Diagnosis not present

## 2023-02-13 LAB — I-STAT CHEM 8, ED
BUN: 10 mg/dL (ref 6–20)
Calcium, Ion: 1.03 mmol/L — ABNORMAL LOW (ref 1.15–1.40)
Chloride: 111 mmol/L (ref 98–111)
Creatinine, Ser: 1 mg/dL (ref 0.61–1.24)
Glucose, Bld: 162 mg/dL — ABNORMAL HIGH (ref 70–99)
HCT: 47 % (ref 39.0–52.0)
Hemoglobin: 16 g/dL (ref 13.0–17.0)
Potassium: 3.9 mmol/L (ref 3.5–5.1)
Sodium: 142 mmol/L (ref 135–145)
TCO2: 17 mmol/L — ABNORMAL LOW (ref 22–32)

## 2023-02-13 LAB — I-STAT VENOUS BLOOD GAS, ED
Acid-base deficit: 10 mmol/L — ABNORMAL HIGH (ref 0.0–2.0)
Bicarbonate: 15.8 mmol/L — ABNORMAL LOW (ref 20.0–28.0)
Calcium, Ion: 1.07 mmol/L — ABNORMAL LOW (ref 1.15–1.40)
HCT: 46 % (ref 39.0–52.0)
Hemoglobin: 15.6 g/dL (ref 13.0–17.0)
O2 Saturation: 99 %
Potassium: 3.9 mmol/L (ref 3.5–5.1)
Sodium: 141 mmol/L (ref 135–145)
TCO2: 17 mmol/L — ABNORMAL LOW (ref 22–32)
pCO2, Ven: 34 mm[Hg] — ABNORMAL LOW (ref 44–60)
pH, Ven: 7.276 (ref 7.25–7.43)
pO2, Ven: 137 mm[Hg] — ABNORMAL HIGH (ref 32–45)

## 2023-02-13 LAB — SALICYLATE LEVEL: Salicylate Lvl: <7 mg/dL — ABNORMAL LOW (ref 7.0–30.0)

## 2023-02-13 LAB — BASIC METABOLIC PANEL
Anion gap: 10 (ref 5–15)
BUN: 10 mg/dL (ref 6–20)
CO2: 19 mmol/L — ABNORMAL LOW (ref 22–32)
Calcium: 8.9 mg/dL (ref 8.9–10.3)
Chloride: 109 mmol/L (ref 98–111)
Creatinine, Ser: 1.16 mg/dL (ref 0.61–1.24)
GFR, Estimated: >60 mL/min (ref >60)
Glucose, Bld: 111 mg/dL — ABNORMAL HIGH (ref 70–99)
Potassium: 4.1 mmol/L (ref 3.5–5.1)
Sodium: 138 mmol/L (ref 135–145)

## 2023-02-13 LAB — VALPROIC ACID LEVEL: Valproic Acid Lvl: <10 ug/mL — ABNORMAL LOW (ref 50.0–100.0)

## 2023-02-13 LAB — COMPREHENSIVE METABOLIC PANEL
ALT: 14 U/L (ref 0–44)
AST: 21 U/L (ref 15–41)
Albumin: 4.1 g/dL (ref 3.5–5.0)
Alkaline Phosphatase: 53 U/L (ref 38–126)
Anion gap: 14 (ref 5–15)
BUN: 10 mg/dL (ref 6–20)
CO2: 16 mmol/L — ABNORMAL LOW (ref 22–32)
Calcium: 8.6 mg/dL — ABNORMAL LOW (ref 8.9–10.3)
Chloride: 109 mmol/L (ref 98–111)
Creatinine, Ser: 1.13 mg/dL (ref 0.61–1.24)
GFR, Estimated: >60 mL/min (ref >60)
Glucose, Bld: 163 mg/dL — ABNORMAL HIGH (ref 70–99)
Potassium: 4 mmol/L (ref 3.5–5.1)
Sodium: 139 mmol/L (ref 135–145)
Total Bilirubin: 0.5 mg/dL (ref 0.0–1.2)
Total Protein: 7 g/dL (ref 6.5–8.1)

## 2023-02-13 LAB — I-STAT ARTERIAL BLOOD GAS, ED
Acid-base deficit: 7 mmol/L — ABNORMAL HIGH (ref 0.0–2.0)
Bicarbonate: 18.6 mmol/L — ABNORMAL LOW (ref 20.0–28.0)
Calcium, Ion: 1.2 mmol/L (ref 1.15–1.40)
HCT: 44 % (ref 39.0–52.0)
Hemoglobin: 15 g/dL (ref 13.0–17.0)
O2 Saturation: 100 %
Potassium: 3.9 mmol/L (ref 3.5–5.1)
Sodium: 140 mmol/L (ref 135–145)
TCO2: 20 mmol/L — ABNORMAL LOW (ref 22–32)
pCO2 arterial: 37.6 mm[Hg] (ref 32–48)
pH, Arterial: 7.302 — ABNORMAL LOW (ref 7.35–7.45)
pO2, Arterial: 313 mm[Hg] — ABNORMAL HIGH (ref 83–108)

## 2023-02-13 LAB — AMMONIA: Ammonia: 42 umol/L — ABNORMAL HIGH (ref 9–35)

## 2023-02-13 LAB — CBC WITH DIFFERENTIAL/PLATELET
Abs Immature Granulocytes: 0.14 10*3/uL — ABNORMAL HIGH (ref 0.00–0.07)
Basophils Absolute: 0 10*3/uL (ref 0.0–0.1)
Basophils Relative: 0 %
Eosinophils Absolute: 0 10*3/uL (ref 0.0–0.5)
Eosinophils Relative: 0 %
HCT: 46.1 % (ref 39.0–52.0)
Hemoglobin: 15.2 g/dL (ref 13.0–17.0)
Immature Granulocytes: 1 %
Lymphocytes Relative: 8 %
Lymphs Abs: 1.5 10*3/uL (ref 0.7–4.0)
MCH: 32.1 pg (ref 26.0–34.0)
MCHC: 33 g/dL (ref 30.0–36.0)
MCV: 97.5 fL (ref 80.0–100.0)
Monocytes Absolute: 0.7 10*3/uL (ref 0.1–1.0)
Monocytes Relative: 4 %
Neutro Abs: 16.6 10*3/uL — ABNORMAL HIGH (ref 1.7–7.7)
Neutrophils Relative %: 87 %
Platelets: 301 10*3/uL (ref 150–400)
RBC: 4.73 MIL/uL (ref 4.22–5.81)
RDW: 13.5 % (ref 11.5–15.5)
WBC: 18.9 10*3/uL — ABNORMAL HIGH (ref 4.0–10.5)
nRBC: 0 % (ref 0.0–0.2)

## 2023-02-13 LAB — MRSA NEXT GEN BY PCR, NASAL: MRSA by PCR Next Gen: NOT DETECTED

## 2023-02-13 LAB — URINALYSIS, ROUTINE W REFLEX MICROSCOPIC
Bilirubin Urine: NEGATIVE
Glucose, UA: NEGATIVE mg/dL
Hgb urine dipstick: NEGATIVE
Ketones, ur: NEGATIVE mg/dL
Leukocytes,Ua: NEGATIVE
Nitrite: NEGATIVE
Protein, ur: NEGATIVE mg/dL
Specific Gravity, Urine: 1.019 (ref 1.005–1.030)
pH: 6 (ref 5.0–8.0)

## 2023-02-13 LAB — POCT I-STAT 7, (LYTES, BLD GAS, ICA,H+H)
Acid-base deficit: 5 mmol/L — ABNORMAL HIGH (ref 0.0–2.0)
Bicarbonate: 18.3 mmol/L — ABNORMAL LOW (ref 20.0–28.0)
Calcium, Ion: 1.16 mmol/L (ref 1.15–1.40)
HCT: 43 % (ref 39.0–52.0)
Hemoglobin: 14.6 g/dL (ref 13.0–17.0)
O2 Saturation: 99 %
Patient temperature: 100.2
Potassium: 3.1 mmol/L — ABNORMAL LOW (ref 3.5–5.1)
Sodium: 140 mmol/L (ref 135–145)
TCO2: 19 mmol/L — ABNORMAL LOW (ref 22–32)
pCO2 arterial: 28.9 mm[Hg] — ABNORMAL LOW (ref 32–48)
pH, Arterial: 7.413 (ref 7.35–7.45)
pO2, Arterial: 159 mm[Hg] — ABNORMAL HIGH (ref 83–108)

## 2023-02-13 LAB — LACTIC ACID, PLASMA
Lactic Acid, Venous: 3.3 mmol/L (ref 0.5–1.9)
Lactic Acid, Venous: 2.4 mmol/L (ref 0.5–1.9)

## 2023-02-13 LAB — RAPID URINE DRUG SCREEN, HOSP PERFORMED
Amphetamines: NOT DETECTED
Barbiturates: NOT DETECTED
Benzodiazepines: POSITIVE — AB
Cocaine: NOT DETECTED
Opiates: NOT DETECTED
Tetrahydrocannabinol: POSITIVE — AB

## 2023-02-13 LAB — I-STAT CG4 LACTIC ACID, ED: Lactic Acid, Venous: 4.2 mmol/L (ref 0.5–1.9)

## 2023-02-13 LAB — MAGNESIUM
Magnesium: 2 mg/dL (ref 1.7–2.4)
Magnesium: 1.9 mg/dL (ref 1.7–2.4)

## 2023-02-13 LAB — ACETAMINOPHEN LEVEL: Acetaminophen (Tylenol), Serum: <10 ug/mL — ABNORMAL LOW (ref 10–30)

## 2023-02-13 LAB — ETHANOL: Alcohol, Ethyl (B): <10 mg/dL (ref <10)

## 2023-02-13 LAB — CK: Total CK: 151 U/L (ref 49–397)

## 2023-02-13 MED ORDER — VALPROATE SODIUM 100 MG/ML IV SOLN
1500.0000 mg | Freq: Once | INTRAVENOUS | Status: AC
  Administered 2023-02-13: 1500 mg via INTRAVENOUS
  Filled 2023-02-13: qty 15

## 2023-02-13 MED ORDER — DOCUSATE SODIUM 50 MG/5ML PO LIQD: 100.0000 mg | Freq: Two times a day (BID) | ORAL | Status: DC | PRN

## 2023-02-13 MED ORDER — LEVETIRACETAM IN NACL 1500 MG/100ML IV SOLN
4500.0000 mg | Freq: Once | INTRAVENOUS | Status: AC
  Administered 2023-02-13: 4500 mg via INTRAVENOUS
  Filled 2023-02-13: qty 300

## 2023-02-13 MED ORDER — POLYETHYLENE GLYCOL 3350 17 G PO PACK: 17.0000 g | PACK | Freq: Every day | ORAL | Status: DC | PRN

## 2023-02-13 MED ORDER — SODIUM CHLORIDE 0.9 % IV SOLN
INTRAVENOUS | Status: DC | PRN
  Administered 2023-02-13: 1000 mL via INTRAVENOUS

## 2023-02-13 MED ORDER — ETOMIDATE 2 MG/ML IV SOLN
INTRAVENOUS | Status: AC | PRN
  Administered 2023-02-13: 20 mg via INTRAVENOUS

## 2023-02-13 MED ORDER — PANTOPRAZOLE SODIUM 40 MG IV SOLR
40.0000 mg | Freq: Every day | INTRAVENOUS | Status: DC
  Administered 2023-02-13 – 2023-02-15 (×3): 40 mg via INTRAVENOUS
  Filled 2023-02-13 (×3): qty 10

## 2023-02-13 MED ORDER — PROPOFOL 1000 MG/100ML IV EMUL
INTRAVENOUS | Status: AC
  Administered 2023-02-13: 30 ug/kg/min via INTRAVENOUS
  Filled 2023-02-13: qty 100

## 2023-02-13 MED ORDER — SODIUM CHLORIDE 0.9 % IV SOLN
INTRAVENOUS | Status: AC | PRN
Start: 2023-02-13 — End: 2023-02-14
  Administered 2023-02-14: 250 mL via INTRAVENOUS

## 2023-02-13 MED ORDER — LORAZEPAM 2 MG/ML IJ SOLN
INTRAMUSCULAR | Status: AC
  Administered 2023-02-13: 2 mg
  Filled 2023-02-13: qty 1

## 2023-02-13 MED ORDER — ORAL CARE MOUTH RINSE
15.0000 mL | OROMUCOSAL | Status: DC | PRN
Start: 2023-02-13 — End: 2023-02-15

## 2023-02-13 MED ORDER — DOCUSATE SODIUM 50 MG/5ML PO LIQD
100.0000 mg | Freq: Two times a day (BID) | ORAL | Status: DC
  Administered 2023-02-13 – 2023-02-16 (×5): 100 mg
  Filled 2023-02-13 (×6): qty 10

## 2023-02-13 MED ORDER — ORAL CARE MOUTH RINSE
15.0000 mL | OROMUCOSAL | Status: DC
Start: 2023-02-13 — End: 2023-02-15
  Administered 2023-02-13 – 2023-02-15 (×24): 15 mL via OROMUCOSAL

## 2023-02-13 MED ORDER — ROCURONIUM BROMIDE 10 MG/ML (PF) SYRINGE
PREFILLED_SYRINGE | INTRAVENOUS | Status: AC | PRN
  Administered 2023-02-13: 100 mg via INTRAVENOUS

## 2023-02-13 MED ORDER — POLYETHYLENE GLYCOL 3350 17 G PO PACK
17.0000 g | PACK | Freq: Every day | ORAL | Status: DC
  Administered 2023-02-14: 17 g
  Filled 2023-02-13: qty 1

## 2023-02-13 MED ORDER — LEVETIRACETAM IN NACL 1000 MG/100ML IV SOLN: 4000.0000 mg | Freq: Once | INTRAVENOUS | Status: DC

## 2023-02-13 MED ORDER — CHLORHEXIDINE GLUCONATE CLOTH 2 % EX PADS
6.0000 | MEDICATED_PAD | Freq: Every day | CUTANEOUS | Status: DC
  Administered 2023-02-13 – 2023-02-17 (×5): 6 via TOPICAL

## 2023-02-13 MED ORDER — ONDANSETRON HCL 4 MG/2ML IJ SOLN
4.0000 mg | Freq: Four times a day (QID) | INTRAMUSCULAR | Status: DC | PRN
Start: 2023-02-13 — End: 2023-02-15
  Administered 2023-02-13 – 2023-02-15 (×3): 4 mg via INTRAVENOUS
  Filled 2023-02-13 (×3): qty 2

## 2023-02-13 MED ORDER — LACTATED RINGERS IV SOLN: INTRAVENOUS | Status: AC

## 2023-02-13 MED ORDER — ACETAMINOPHEN 325 MG PO TABS
650.0000 mg | ORAL_TABLET | ORAL | Status: DC | PRN
  Filled 2023-02-13: qty 2

## 2023-02-13 MED ORDER — LEVETIRACETAM IN NACL 500 MG/100ML IV SOLN: 500.0000 mg | Freq: Once | INTRAVENOUS | Status: DC

## 2023-02-13 MED ORDER — ACETAMINOPHEN 325 MG PO TABS
650.0000 mg | ORAL_TABLET | ORAL | Status: DC | PRN
  Administered 2023-02-13 – 2023-02-14 (×2): 650 mg
  Filled 2023-02-13: qty 2

## 2023-02-13 MED ORDER — PROPOFOL 1000 MG/100ML IV EMUL
5.0000 ug/kg/min | INTRAVENOUS | Status: DC
  Administered 2023-02-13: 40 ug/kg/min via INTRAVENOUS
  Administered 2023-02-13: 50 ug/kg/min via INTRAVENOUS
  Administered 2023-02-13: 70 ug/kg/min via INTRAVENOUS
  Administered 2023-02-14: 30 ug/kg/min via INTRAVENOUS
  Administered 2023-02-14: 70 ug/kg/min via INTRAVENOUS
  Filled 2023-02-13: qty 200
  Filled 2023-02-13: qty 100
  Filled 2023-02-13: qty 200

## 2023-02-13 MED ORDER — VALPROATE SODIUM 100 MG/ML IV SOLN
250.0000 mg | Freq: Four times a day (QID) | INTRAVENOUS | Status: DC
  Administered 2023-02-13 – 2023-02-15 (×7): 250 mg via INTRAVENOUS
  Filled 2023-02-13 (×2): qty 2.5
  Filled 2023-02-13: qty 250
  Filled 2023-02-13 (×2): qty 2.5
  Filled 2023-02-13: qty 250
  Filled 2023-02-13: qty 2.5
  Filled 2023-02-13: qty 250
  Filled 2023-02-13: qty 2.5
  Filled 2023-02-13 (×3): qty 250
  Filled 2023-02-13 (×2): qty 2.5

## 2023-02-13 MED ORDER — HEPARIN SODIUM (PORCINE) 5000 UNIT/ML IJ SOLN
5000.0000 [IU] | Freq: Three times a day (TID) | INTRAMUSCULAR | Status: DC
  Administered 2023-02-13 – 2023-02-17 (×12): 5000 [IU] via SUBCUTANEOUS
  Filled 2023-02-13 (×12): qty 1

## 2023-02-13 MED ORDER — DOCUSATE SODIUM 100 MG PO CAPS: 100.0000 mg | ORAL_CAPSULE | Freq: Two times a day (BID) | ORAL | Status: DC | PRN

## 2023-02-13 NOTE — ED Triage Notes (Signed)
Pt bib ems from home c/o seizure. Pt mother found pt in bed after having a seizure. Pt began to have another seizure and pt called 911. Pt mother administered pt IN 15 mg Diazepam. When ems arrived pt started having seizure like activity and was given 10 mg Midazolam.   HR 110 BP 146/98 RR 22 Non rebreather 96% CBG 130

## 2023-02-13 NOTE — Progress Notes (Signed)
STAT LTM EEG hooked up and running - no initial skin breakdown. Unmonitored at this time patient Is in the ED, CT leads used.

## 2023-02-13 NOTE — H&P (Signed)
NAME:  Miguel Black, MRN:  [id number], DOB:  07-13-1990, LOS: 0 ADMISSION DATE:  02/13/2023, CONSULTATION DATE:  02/13/2023 REFERRING MD:  Earlene Plater, EDP, CHIEF COMPLAINT:  status epilepticus    History of Present Illness:  Patient intubated, sedated on initial exam.  HPI portion of note is obtained from chart review as well as discussion with EDP.  33 year old male with past medical history of multiple sclerosis, seizure disorder who presented to the emergency department on 02/13/2023 with seizure.  Was reportedly with a friend at home when began having seizure.  He received 15 mg of midazolam and 10 mg diazepam with EMS.  Per ED provider on arrival, patient had a left-sided gaze, arms were contracted and had clonus with resistance provided to upper extremities.  Presumed ongoing seizure and was given another 2 mg of Ativan and intubated for airway protection.  Was loaded with 4.5 g Keppra and placed on propofol for sedation/seizure suppression.  Neurology was consulted as well as PCCM for admission.  Appears that he was admitted on 11/12/2022-11/15/2022 for status epilepticus and was discharged on valproic acid 500 mg twice daily, lamotrigine up taper, Ocrevus injections for MS and diazepam 5 mg and as needed.  He was to have follow-up with [facility] neurology, however he was seen by primary care on 11/24/2022 for hospital follow-up and taken off lamotrigine and placed on topiramate.  On my exam, intubated, sedated on propofol.  Pupils are 4 mm and reactive, no gaze deviation.  He does not withdraw to painful stimulus.  Being taken to CT scan.  Pertinent  Medical History  Multiple sclerosis, seizure disorder  Significant Hospital Events: Including procedures, antibiotic start and stop dates in addition to other pertinent events   1/20: Admit for status epilepticus  Interim History / Subjective:  Patient is unable to participate in the subjective portion of the exam.  He is intubated and sedated on  propofol.  No obvious seizure-like activity on initial exam.  Objective   Height 5\' 11"  (1.803 m), SpO2 100%.    Vent Mode: PRVC FiO2 (%):  [100 %] 100 % Set Rate:  [26 bmp] 26 bmp Vt Set:  [600 mL] 600 mL PEEP:  [5 cmH20] 5 cmH20  No intake or output data in the 24 hours ending 02/13/23 1528 There were no vitals filed for this visit.  Examination: General: Young male, laying in bed, intubated, sedated HENT: /AT, anicteric sclera, 4 mm and reactive pupils bilaterally, ET tube Lungs: Rhonchi left lower lung fields, otherwise no wheezing, rales.  Vented.  Copious clear to white secretions Cardiovascular: S1/S2 without murmur, rub, gallop.  Sinus rhythm. Abdomen: Flat, soft, nondistended Extremities: No pitting edema, warm and dry Neuro: Paralytic given about 40 minutes prior to exam, on propofol for sedation.  However pupils are equal bilaterally, no gaze.  He does not withdrawal to painful stimulus in all 4 extremities GU: Foley  Resolved Hospital Problem list    Assessment & Plan:  Status epilepticus Seizure disorder Unsure how long he was seizing for prior to arrival.  Initially given 15 mg midazolam, 10 mg diazepam and then 2 mg of Ativan on arrival to the emergency department.  Reported clonus and left-sided gaze when he arrived.  Otherwise obtunded and intubated for airway protection.  Unclear compliance.  No reported alcohol use, illicit drug use or head trauma, recent illness. S/p 4.5g Keppra load. Lactic 4.2, WBC 18, consistent with recent seizure activity. MRI from 11/14/22 with stable MS lesions - f/u  CT head - f/u lamotrigine and valproic acid, topirimate levels  - f/u ammonia, asa, acetaminophen, EtOH level  - f/u UDS, UA, CK, mag - con't propofol for seizure suppression - STAT EEG  - frequent neuro checks   - neurology following, appreciate recs  Acute hypoxic respiratory failure 2/2 status epilepticus  Intubated for airway protection, obtundation  - f/u 1hr ABG  and adjust vent  - f/u intubation CXR  - full mechanical vent support - lung protective ventilation 6-8cc/kg Vt - VAP and PAD bundle in place  - titrate FiO2 to sat goal >92  - maintain peak/plats <30, driving pressures <28    Multiple sclerosis  Followed by [facility]. On Ocrevus injections  - neurology following, appreciate recs - restart home medication when clinically appropriate   Best Practice (right click and "Reselect all SmartList Selections" daily)   Diet/type: NPO DVT prophylaxis: prophylactic heparin  Pressure ulcer(s): none GI prophylaxis: PPI Lines: N/A Foley:  Yes, and it is still needed Code Status:  full code Last date of multidisciplinary goals of care discussion [pending]  Labs   CBC: No results for input(s): "WBC", "NEUTROABS", "HGB", "HCT", "MCV", "PLT" in the last 168 hours.  Basic Metabolic Panel: No results for input(s): "NA", "K", "CL", "CO2", "GLUCOSE", "BUN", "CREATININE", "CALCIUM", "MG", "PHOS" in the last 168 hours. GFR: CrCl cannot be calculated (Patient's most recent lab result is older than the maximum 21 days allowed.). No results for input(s): "PROCALCITON", "WBC", "LATICACIDVEN" in the last 168 hours.  Liver Function Tests: No results for input(s): "AST", "ALT", "ALKPHOS", "BILITOT", "PROT", "ALBUMIN" in the last 168 hours. No results for input(s): "LIPASE", "AMYLASE" in the last 168 hours. No results for input(s): "AMMONIA" in the last 168 hours.  ABG    Component Value Date/Time   PHART 7.263 (L) 06/13/2022 1523   PCO2ART 51.3 (H) 06/13/2022 1523   PO2ART 510 (H) 06/13/2022 1523   HCO3 23.1 06/13/2022 1523   TCO2 25 06/13/2022 1523   ACIDBASEDEF 4.0 (H) 06/13/2022 1523   O2SAT 100 06/13/2022 1523     Coagulation Profile: No results for input(s): "INR", "PROTIME" in the last 168 hours.  Cardiac Enzymes: No results for input(s): "CKTOTAL", "CKMB", "CKMBINDEX", "TROPONINI" in the last 168 hours.  HbA1C: Hgb A1c MFr Bld   Date/Time Value Ref Range Status  08/22/2021 06:37 AM 5.5 4.8 - 5.6 % Final    Comment:    (NOTE) Pre diabetes:          5.7%-6.4%  Diabetes:              >6.4%  Glycemic control for   <7.0% adults with diabetes     CBG: No results for input(s): "GLUCAP" in the last 168 hours.  Review of Systems:   As above  Past Medical History:  He,  has a past medical history of Acute encephalopathy (04/26/2022), Acute metabolic encephalopathy (04/26/2022), Acute respiratory failure with hypoxia (HCC) (08/23/2021), Aspiration pneumonia (HCC) (07/12/2018), Closed fracture of distal phalanx or phalanges of hand (02/05/2009), Encounter for screening for HIV (07/12/2018), Foot drop (06/03/2022), Marijuana abuse (05/01/2013), MS (multiple sclerosis) (HCC), Seizure (HCC), Seizures (HCC) (01/22/2019), and Status epilepticus (HCC) (07/12/2018).   Surgical History:   Past Surgical History:  Procedure Laterality Date   NO PAST SURGERIES       Social History:   reports that he has been smoking cigars. He has never used smokeless tobacco. He reports current alcohol use. He reports current drug use. Drug: Marijuana.  Family History:  His family history includes Arthritis in his maternal grandmother and mother; Colon cancer (age of onset: 53) in his maternal uncle; Diabetes in his maternal grandmother and paternal grandmother; Healthy in his brother, father, and sister; Hodgkin's lymphoma in his brother; Hypertension in his maternal grandfather, maternal grandmother, and mother; Kidney disease in his maternal grandmother; Stroke in his maternal grandfather.   Allergies Allergies  Allergen Reactions   Dalfampridine Other (See Comments)    Seizure 02/2021 while on dalfampridine     Home Medications  Prior to Admission medications   Medication Sig Start Date End Date Taking? Authorizing Provider  diazePAM, 15 MG Dose, (VALTOCO 15 MG DOSE) 2 x 7.5 MG/0.1ML LQPK Place 1 Dose into the nose daily as  needed. 11/24/22   Lula Olszewski, MD  divalproex (DEPAKOTE) 500 MG DR tablet Take 1 tablet (500 mg total) by mouth every 12 (twelve) hours. 11/24/22   Lula Olszewski, MD  lamoTRIgine (LAMICTAL) 25 MG tablet Take 1 tablet (25 mg total) by mouth every other day for 12 days, THEN 1 tablet (25 mg total) daily for 14 days, THEN 2 tablets (50 mg total) daily for 7 days. 11/15/22 12/18/22  Gillis Santa, MD  ocrelizumab (OCREVUS) 300 MG/10ML injection Inject 20 mLs (600 mg total) into the vein every 6 (six) months. 10/26/22   Sater, Pearletha Furl, MD  topiramate (TOPAMAX) 25 MG tablet TAKE 2 TABLETS BY MOUTH 2 TIMES DAILY. 01/06/23   Lula Olszewski, MD     Critical care time: 82   Lenard Galloway [facility] 02/13/23 4:11 PM  Please see [email or web address] for pager details.  From 7A-7P if no response, please call [phone] After hours, please call ELink [phone]

## 2023-02-13 NOTE — ED Provider Notes (Signed)
[place] EMERGENCY DEPARTMENT AT [facility] Provider Note   CSN: [id number] Arrival date & time: 02/13/23  1430     History  Chief Complaint  Patient presents with   Seizures    Miguel Black is a 33 y.o. male.   Seizures 33 year old male history of MS, seizure disorder on Depakote and Ocrevus presenting due to seizure.  Patient reportedly had seizure at home.  With EMS he was having generalized seizure.  He received 15 mg of midazolam and 10 mg of diazepam.  He stopped having generalized seizure but was still nonresponsive.  On arrival here he is obtunded.  He will occasionally move and try to localize pain or withdrawal from pain.  Left-sided gaze deviation.     Home Medications Prior to Admission medications   Medication Sig Start Date End Date Taking? Authorizing Provider  diazePAM, 15 MG Dose, (VALTOCO 15 MG DOSE) 2 x 7.5 MG/0.1ML LQPK Place 1 Dose into the nose daily as needed. 11/24/22  Yes Lula Olszewski, MD  divalproex (DEPAKOTE) 500 MG DR tablet Take 1 tablet (500 mg total) by mouth every 12 (twelve) hours. 11/24/22   Lula Olszewski, MD  lamoTRIgine (LAMICTAL) 25 MG tablet Take 1 tablet (25 mg total) by mouth every other day for 12 days, THEN 1 tablet (25 mg total) daily for 14 days, THEN 2 tablets (50 mg total) daily for 7 days. 11/15/22 12/18/22  Gillis Santa, MD  ocrelizumab (OCREVUS) 300 MG/10ML injection Inject 20 mLs (600 mg total) into the vein every 6 (six) months. 10/26/22   Sater, Pearletha Furl, MD  topiramate (TOPAMAX) 25 MG tablet TAKE 2 TABLETS BY MOUTH 2 TIMES DAILY. 01/06/23   Lula Olszewski, MD      Allergies    Dalfampridine    Review of Systems   Review of Systems  Neurological:  Positive for seizures.    Physical Exam Updated Vital Signs BP (!) 146/91   Pulse 78   Temp 99.4 F (37.4 C)   Resp (!) 26   Ht 5\' 11"  (1.803 m)   SpO2 100%   BMI 22.79 kg/m  Physical Exam Vitals and nursing note reviewed.  Constitutional:       Appearance: He is well-developed. He is ill-appearing, toxic-appearing and diaphoretic.     Comments: Obtunded  HENT:     Head: Normocephalic and atraumatic.  Eyes:     Conjunctiva/sclera: Conjunctivae normal.     Comments: Left-sided fixed gaze deviation.  Pupils are reactive.  Cardiovascular:     Rate and Rhythm: Normal rate and regular rhythm.     Heart sounds: No murmur heard. Pulmonary:     Effort: Respiratory distress present.     Breath sounds: Rhonchi present.     Comments: Gurgling respirations Abdominal:     Palpations: Abdomen is soft.     Tenderness: There is no abdominal tenderness.  Musculoskeletal:        General: No swelling.     Cervical back: Neck supple. No rigidity.     Right lower leg: No edema.     Left lower leg: No edema.  Skin:    General: Skin is warm.     Capillary Refill: Capillary refill takes less than 2 seconds.  Neurological:     Comments: Patient found with gargling respirations.  Occasionally will localize to pain on the left side or withdraw from pain.  He has markedly increased tone in bilateral upper and lower extremities.  He has  left-sided gaze deviation, no blink to threat, pupils are reactive.  Gaze is not cross midline.  When I attempt to move his limbs he has continuous clonus.  Psychiatric:        Mood and Affect: Mood normal.      ED Results / Procedures / Treatments   Labs (all labs ordered are listed, but only abnormal results are displayed) Labs Reviewed  CBC WITH DIFFERENTIAL/PLATELET - Abnormal; Notable for the following components:      Result Value   WBC 18.9 (*)    Neutro Abs 16.6 (*)    Abs Immature Granulocytes 0.14 (*)    All other components within normal limits  I-STAT CHEM 8, ED - Abnormal; Notable for the following components:   Glucose, Bld 162 (*)    Calcium, Ion 1.03 (*)    TCO2 17 (*)    All other components within normal limits  I-STAT CG4 LACTIC ACID, ED - Abnormal; Notable for the following  components:   Lactic Acid, Venous 4.2 (*)    All other components within normal limits  I-STAT VENOUS BLOOD GAS, ED - Abnormal; Notable for the following components:   pCO2, Ven 34.0 (*)    pO2, Ven 137 (*)    Bicarbonate 15.8 (*)    TCO2 17 (*)    Acid-base deficit 10.0 (*)    Calcium, Ion 1.07 (*)    All other components within normal limits  COMPREHENSIVE METABOLIC PANEL  ACETAMINOPHEN LEVEL  ETHANOL  SALICYLATE LEVEL  URINALYSIS, ROUTINE W REFLEX MICROSCOPIC  RAPID URINE DRUG SCREEN, HOSP PERFORMED  MAGNESIUM  CK  MISC [facility] TEST (SEND OUT)  AMMONIA  LACTIC ACID, PLASMA  LACTIC ACID, PLASMA  VALPROIC ACID LEVEL  LAMOTRIGINE LEVEL  I-STAT ARTERIAL BLOOD GAS, ED    EKG EKG Interpretation Date/Time:  Monday February 13 2023 14:44:06 EST Ventricular Rate:  110 PR Interval:  113 QRS Duration:  86 QT Interval:  332 QTC Calculation: 450 R Axis:   74  Text Interpretation: Sinus tachycardia Ventricular premature complex Aberrant complex Biatrial enlargement Confirmed by Alvester Chou [phone]) on 02/13/2023 3:32:53 PM  Radiology No results found.  Procedures .Critical Care  Performed by: Laurence Spates, MD Authorized by: Laurence Spates, MD   Critical care provider statement:    Critical care time (minutes):  30   Critical care was necessary to treat or prevent imminent or life-threatening deterioration of the following conditions:  CNS failure or compromise   Critical care was time spent personally by me on the following activities:  Development of treatment plan with patient or surrogate, discussions with consultants, evaluation of patient's response to treatment, examination of patient, ordering and review of laboratory studies, ordering and review of radiographic studies, ordering and performing treatments and interventions, pulse oximetry, re-evaluation of patient's condition and review of old charts   Care discussed with: admitting provider   Procedure Name:  Intubation Date/Time: 02/13/2023 4:11 PM  Performed by: Laurence Spates, MDPre-anesthesia Checklist: Emergency Drugs available, Patient identified, Patient being monitored, Timeout performed and Suction available Oxygen Delivery Method: Ambu bag Preoxygenation: Pre-oxygenation with 100% oxygen Induction Type: Rapid sequence Ventilation: Mask ventilation without difficulty Laryngoscope Size: Glidescope and 4 Grade View: Grade I Tube type: Subglottic suction tube Tube size: 7.5 mm Number of attempts: 1 Airway Equipment and Method: Rigid stylet Placement Confirmation: ETT inserted through vocal cords under direct vision, CO2 detector, Positive ETCO2 and Breath sounds checked- equal and bilateral Secured at: 25 cm Tube secured  with: ETT holder Dental Injury: Teeth and Oropharynx as per pre-operative assessment         Medications Ordered in ED Medications  etomidate (AMIDATE) injection (20 mg Intravenous Given 02/13/23 1452)  rocuronium (ZEMURON) injection (100 mg Intravenous Given 02/13/23 1453)  0.9 %  sodium chloride infusion (1,000 mLs Intravenous New Bag/Given 02/13/23 1450)  propofol (DIPRIVAN) 1000 MG/100ML infusion (50 mcg/kg/min  75 kg (Order-Specific) Intravenous New Bag/Given 02/13/23 1614)  heparin injection 5,000 Units (has no administration in time range)  pantoprazole (PROTONIX) injection 40 mg (has no administration in time range)  acetaminophen (TYLENOL) tablet 650 mg (has no administration in time range)  docusate sodium (COLACE) capsule 100 mg (has no administration in time range)  polyethylene glycol (MIRALAX / GLYCOLAX) packet 17 g (has no administration in time range)  ondansetron (ZOFRAN) injection 4 mg (has no administration in time range)  docusate (COLACE) 50 MG/5ML liquid 100 mg (has no administration in time range)  polyethylene glycol (MIRALAX / GLYCOLAX) packet 17 g (has no administration in time range)  LORazepam (ATIVAN) 2 MG/ML injection (2 mg  Given  02/13/23 1441)  levETIRAcetam (KEPPRA) IVPB 1500 mg/ 100 mL premix (4,500 mg Intravenous New Bag/Given 02/13/23 1520)    ED Course/ Medical Decision Making/ A&P Clinical Course as of 02/13/23 1616  Mon Feb 13, 2023  1501 Attempted call family, unable to reach them. [JD]    Clinical Course User Index [JD] Laurence Spates, MD                                 Medical Decision Making Amount and/or Complexity of Data Reviewed Labs: ordered. Radiology: ordered.  Risk Prescription drug management. Decision regarding hospitalization.   Medical Decision Making:   Miguel Black is a 33 y.o. male who presented to the ED today with status epilepticus.  He already received multiple rounds of benzodiazepines of EMS.  On arrival he is not convulsing but he has markedly increased tone, clonus, left-sided fixed gaze deviation and is not protecting his airway.  I given 2 of Ativan without any improvement.  I am concerned for status epilepticus and because of his continuous seizure-like activity and altered mental status without ability to protect his airway I elevated him as above.  And patient proceeded without complication.  He was hyperventilated before and after innervation due to concern for acidosis.  Blood gas has been sent.  I reviewed his chest x-ray which shows good placement of the ET tube.  CT head is ordered as well.  I paged neurology and spoke with Dr. Iver Nestle who will evaluate patient.  She is agreeable with plan for propofol for sedation, Keppra load.  He will need LTM.  I spoke with the critical care team who will admit the patient.  I did attempt to speak with patient's mother to update her but was unable to reach her over the phone.  Patient to be admitted to critical care for further management.   Patient placed on continuous vitals and telemetry monitoring while in ED which was reviewed periodically.  Reviewed and confirmed nursing documentation for past medical history, family  history, social history.   Patient's presentation is most consistent with acute presentation with potential threat to life or bodily function.           Final Clinical Impression(s) / ED Diagnoses Final diagnoses:  Status epilepticus (HCC)    Rx / DC Orders  ED Discharge Orders     None         Laurence Spates, MD 02/13/23 1616

## 2023-02-13 NOTE — Progress Notes (Signed)
Pt transported on vent to 4N26 without complications.

## 2023-02-13 NOTE — Progress Notes (Signed)
VAST consult. Arrived to unit. PIV has been placed by unit. Tomasita Morrow, RN VAST

## 2023-02-13 NOTE — Plan of Care (Addendum)
EEG shows no electrographic seizures. Generalized slowing noted.   VPA has resulted low at < 10.   Ordering a VPA load of 20 mg/kg IV x 1.   Electronically signed: Dr. Caryl Pina

## 2023-02-13 NOTE — ED Notes (Signed)
RN attempted to give report x1 

## 2023-02-13 NOTE — ED Notes (Signed)
RN attempted to give report x2 and provided phone number.

## 2023-02-13 NOTE — Consult Note (Signed)
NEUROLOGY CONSULT NOTE   Date of service: February 13, 2023 Patient Name: Miguel Black MRN:  [id number] DOB:  10/27/1990 Chief Complaint: "seizure" Requesting Provider: Marcelle Smiling, MD  History of Present Illness  Miguel Black is a 33 y.o. left-handed man with a PMHx significant for MS on Ocrevus (last dose due 11/23/2022 per notes but unclear if he received this), complicated by epilepsy (on Depakote and either lamotrigine or topiramate)  He has had frequent presentations with seizures in the past secondary to medication nonadherence frequently, but sometimes also in the setting of medication adherence.  For example his last admission 10/07/2022 his valproic acid level was therapeutic so lamotrigine was added.  However on PCP follow-up 11/24/2022 topiramate was started, he was encouraged to use a high CBD marijuana, and there was a plan to refer to new neurologist per notes; he has previously followed with Dr. Epimenio Foot at [facility], last seen by NP Lomax on 08/08/2022 with prescription for Ocrevus ordered on 10/26/2022  Per dispense report available to me,  Topiramate was last dispensed 02/11/2023 for a 90-day supply Lamotrigine was last dispensed 11/14/2022 for a 30-day supply Zonisamide was last dispensed 11/18/2022 for a 90-day supply Depakote was last dispensed 10/09/2022 for a 90-day supply  Other prior seizure medications trialed: Keppra and Briviact both associated with moved changes Vimpat, unclear why discontinued (06/15/2022 note switched back to home zonisamide)  Very limited history available as the patient is intubated, minimally responsive and unable to reach family by phone, no family at bedside.  Per ED notes patient was brought in from home due to seizure, found by mother in bed after having a seizure and had further witnessed seizure activity, not responsive to his intranasal rescue medication (15 mg diazepam), with additional seizure activity with  EMS for which she was given 10 mg of midazolam  Left gaze deviation, occasionally trying to move or localize to pain and withdraw from pain on arrival to the ED; intubated for airway protection  ROS  Unable to ascertain due to mental status  Past History   Past Medical History:  Diagnosis Date   Acute encephalopathy 04/26/2022   Acute metabolic encephalopathy 04/26/2022   Acute respiratory failure with hypoxia (HCC) 08/23/2021   Aspiration pneumonia (HCC) 07/12/2018   Closed fracture of distal phalanx or phalanges of hand 02/05/2009   Formatting of this note might be different from the original. Overview: The original diagnosis was replaced automatically by the ICD-10 team to an ICD-10 compliant one. Please review for accuracy   Encounter for screening for HIV 07/12/2018   Foot drop 06/03/2022   Has a left foot drop ever since 2015 first flare of multiple sclerosis had a brace in 2022 custom but does not actually do anything to lift the from the foot physical therapist advised him to get an alternative foot drop brace but I cannot find record of that   Marijuana abuse 05/01/2013   MS (multiple sclerosis) (HCC)    Seizure (HCC)    Seizures (HCC) 01/22/2019   Status epilepticus (HCC) 07/12/2018    Past Surgical History:  Procedure Laterality Date   NO PAST SURGERIES      Family History: Family History  Problem Relation Age of Onset   Arthritis Mother    Hypertension Mother    Healthy Father    Healthy Sister    Healthy Brother    Hodgkin's lymphoma Brother    Colon cancer Maternal Uncle 45   Kidney disease Maternal Grandmother  Hypertension Maternal Grandmother    Arthritis Maternal Grandmother    Diabetes Maternal Grandmother    Stroke Maternal Grandfather    Hypertension Maternal Grandfather    Diabetes Paternal Grandmother     Social History  reports that he has been smoking cigars. He has never used smokeless tobacco. He reports current alcohol use. He reports  current drug use. Drug: Marijuana.  Allergies  Allergen Reactions   Dalfampridine Other (See Comments)    Seizure 02/2021 while on dalfampridine    Medications   Current Facility-Administered Medications:    0.9 %  sodium chloride infusion, , Intravenous, Code/Trauma/Sedation Continuous Med, Fulton Reek H, MD, 1,000 mL at 02/13/23 1450   acetaminophen (TYLENOL) tablet 650 mg, 650 mg, Oral, Q4H PRN, Bowser, Kaylyn Layer, NP   docusate (COLACE) 50 MG/5ML liquid 100 mg, 100 mg, Per Tube, BID, Bowser, Kaylyn Layer, NP   docusate sodium (COLACE) capsule 100 mg, 100 mg, Oral, BID PRN, Bowser, Kaylyn Layer, NP   etomidate (AMIDATE) injection, , Intravenous, Code/Trauma/Sedation Med, Fulton Reek H, MD, 20 mg at 02/13/23 1452   heparin injection 5,000 Units, 5,000 Units, Subcutaneous, Q8H, Bowser, Grace E, NP   ondansetron (ZOFRAN) injection 4 mg, 4 mg, Intravenous, Q6H PRN, Bowser, Kaylyn Layer, NP   pantoprazole (PROTONIX) injection 40 mg, 40 mg, Intravenous, QHS, Bowser, Grace E, NP   polyethylene glycol (MIRALAX / GLYCOLAX) packet 17 g, 17 g, Oral, Daily PRN, Bowser, Kaylyn Layer, NP   polyethylene glycol (MIRALAX / GLYCOLAX) packet 17 g, 17 g, Per Tube, Daily, Bowser, Grace E, NP   propofol (DIPRIVAN) 1000 MG/100ML infusion, , , ,    propofol (DIPRIVAN) 1000 MG/100ML infusion, 5-80 mcg/kg/min (Order-Specific), Intravenous, Titrated, Laurence Spates, MD   rocuronium [facility]) injection, , Intravenous, Code/Trauma/Sedation Med, Fulton Reek H, MD, 100 mg at 02/13/23 1453  Current Outpatient Medications:    diazePAM, 15 MG Dose, (VALTOCO 15 MG DOSE) 2 x 7.5 MG/0.1ML LQPK, Place 1 Dose into the nose daily as needed., Disp: 2 each, Rfl: 2   divalproex (DEPAKOTE) 500 MG DR tablet, Take 1 tablet (500 mg total) by mouth every 12 (twelve) hours., Disp: 180 tablet, Rfl: 0   lamoTRIgine (LAMICTAL) 25 MG tablet, Take 1 tablet (25 mg total) by mouth every other day for 12 days, THEN 1 tablet (25 mg total) daily for 14  days, THEN 2 tablets (50 mg total) daily for 7 days., Disp: 34 tablet, Rfl: 0   ocrelizumab (OCREVUS) 300 MG/10ML injection, Inject 20 mLs (600 mg total) into the vein every 6 (six) months., Disp: 20 mL, Rfl: 1   topiramate (TOPAMAX) 25 MG tablet, TAKE 2 TABLETS BY MOUTH 2 TIMES DAILY., Disp: 360 tablet, Rfl: 1  Vitals   Vitals:   02/13/23 1400 02/13/23 1500  SpO2:  100%  Height: 5\' 11"  (1.803 m)     Body mass index is 22.79 kg/m.  Physical Exam   Constitutional: Appears well-developed and well-nourished.  Psych: Not interactive Eyes: Mild bilateral scleral injection.  HENT: No OP obstruction.  Head: Normocephalic.  Cardiovascular: Tachycardic and regular rhythm.  Respiratory: Breathing in the ventilator set rate, comfortable on the ventilator GI: Soft.  No distension. .  Skin: WDI.   Neurologic Examination   Nonresponsive to voice.  Eyes open, no blink to threat.  Not following commands.  Pupils equal round reactive to light, absent VOR.  Absent cough/gag.  No blink to eyelash brush, more aggressive corneal stimulation was not applied Nonresponsive to maximal noxious stimulation  in all 4 extremities   Labs/Imaging/Neurodiagnostic studies   CBC:  Recent Labs  Lab 2023/03/04 1501 04-Mar-2023 1533  WBC 18.9*  --   NEUTROABS 16.6*  --   HGB 15.2 16.0  15.6  HCT 46.1 47.0  46.0  MCV 97.5  --   PLT 301  --    Basic Metabolic Panel:  Lab Results  Component Value Date   NA 142 03/04/23   NA 141 03-04-2023   K 3.9 03/04/23   K 3.9 03/04/23   CO2 21 (L) 11/14/2022   GLUCOSE 162 (H) 2023-03-04   BUN 10 03/04/23   CREATININE 1.00 03-04-2023   CALCIUM 8.2 (L) 11/14/2022   GFRNONAA >60 11/14/2022   GFRAA 99 07/22/2019   Lipid Panel:  Lab Results  Component Value Date   LDLCALC 113 (H) 06/03/2022   HgbA1c:  Lab Results  Component Value Date   HGBA1C 5.5 08/22/2021   Urine Drug Screen:     Component Value Date/Time   LABOPIA NONE DETECTED 11/13/2022  1045   COCAINSCRNUR NONE DETECTED 11/13/2022 1045   LABBENZ POSITIVE (A) 11/13/2022 1045   AMPHETMU NONE DETECTED 11/13/2022 1045   THCU POSITIVE (A) 11/13/2022 1045   LABBARB NONE DETECTED 11/13/2022 1045    Alcohol Level     Component Value Date/Time   ETH <10 11/12/2022 1532   INR No results found for: "INR" APTT No results found for: "APTT" AED levels:  Lab Results  Component Value Date   LEVETIRACETA <2.0 (L) 06/13/2022    CT Head without contrast(Personally reviewed): 1. No CT evidence for acute intracranial abnormality. 2. White matter hypodensities grossly similar, presumably corresponding to history of MS.   Neurodiagnostics EEG:  pending  ASSESSMENT   Breakthrough seizures potentially in the setting of no longer taking Depakote per fill history available to me  RECOMMENDATIONS  - Stat overnight EEG monitoring - Depakote level, topiramate level, lamotrigine level, to clarify/verify current adherence - Ammonia, CK, magnesium - S/p Keppra loading dose 4.5 g - Agree with propofol drip, if further seizure activity would start Versed - Propofol 2 mg/kg loading dose q34min up to 5 doses followed by drip of 16.666 - 1666 mcg/kg/min (1-10 mg/kg/hr) is the typical status protocol   - Cannot give boluses in ICU without anesthesia, continue drip for now at 50 mcg/kg/min - Midazolam 0.2 mg/kg loading dose, 0.1 - 2 mg/kg/hr maintenance would be my next agent of choice or Vimpat - Depakote, level pending, will resume home 500 mg twice daily ordered as 250 every 6 hours for now pending level, if level is low may give an additional loading dose - Holding home lamotrigine for now pending oral access and confirmation of whether he is taking lamotrigine or topiramate - Neurology will follow along  ______________________________________________________________________   Brooke Dare MD-PhD [facility] [phone]   CRITICAL CARE Performed by: Gordy Councilman   Total critical care time: 40 minutes  Critical care time was exclusive of separately billable procedures and treating other patients.  Critical care was necessary to treat or prevent imminent or life-threatening deterioration.  Critical care was time spent personally by me on the following activities: development of treatment plan with patient and/or surrogate as well as nursing, discussions with consultants, evaluation of patient's response to treatment, examination of patient, obtaining history from patient or surrogate, ordering and performing treatments and interventions, ordering and review of laboratory studies, ordering and review of radiographic studies, pulse oximetry and re-evaluation of patient's condition.

## 2023-02-13 NOTE — Progress Notes (Addendum)
eLink Physician-Brief Progress Note Patient Name: Miguel Black DOB: August 17, 1990 MRN: [id number]   Date of Service  02/13/2023  HPI/Events of Note  LA notified, trending down on LR at 75 ml/hr. Tox + for THC, Benzos.   eICU Interventions  Continue care     Intervention Category Intermediate Interventions: Other:  Miguel Black 02/13/2023, 7:21 PM  23:19  Camera eval done. Discussed with RN. Settled down after zofran, for increased NG secretions. On propofol. VS stable.  Call back if any further changes.

## 2023-02-14 ENCOUNTER — Inpatient Hospital Stay (HOSPITAL_COMMUNITY): Payer: Medicare Other

## 2023-02-14 DIAGNOSIS — G40901 Epilepsy, unspecified, not intractable, with status epilepticus: Secondary | ICD-10-CM | POA: Diagnosis not present

## 2023-02-14 DIAGNOSIS — R0689 Other abnormalities of breathing: Secondary | ICD-10-CM

## 2023-02-14 LAB — BASIC METABOLIC PANEL
Anion gap: 14 (ref 5–15)
BUN: 8 mg/dL (ref 6–20)
CO2: 17 mmol/L — ABNORMAL LOW (ref 22–32)
Calcium: 8.5 mg/dL — ABNORMAL LOW (ref 8.9–10.3)
Chloride: 110 mmol/L (ref 98–111)
Creatinine, Ser: 1.26 mg/dL — ABNORMAL HIGH (ref 0.61–1.24)
GFR, Estimated: >60 mL/min (ref >60)
Glucose, Bld: 89 mg/dL (ref 70–99)
Potassium: 3.5 mmol/L (ref 3.5–5.1)
Sodium: 141 mmol/L (ref 135–145)

## 2023-02-14 LAB — CBC
HCT: 46 % (ref 39.0–52.0)
Hemoglobin: 15.3 g/dL (ref 13.0–17.0)
MCH: 32.1 pg (ref 26.0–34.0)
MCHC: 33.3 g/dL (ref 30.0–36.0)
MCV: 96.6 fL (ref 80.0–100.0)
Platelets: 230 10*3/uL (ref 150–400)
RBC: 4.76 MIL/uL (ref 4.22–5.81)
RDW: 13.6 % (ref 11.5–15.5)
WBC: 23.5 10*3/uL — ABNORMAL HIGH (ref 4.0–10.5)
nRBC: 0 % (ref 0.0–0.2)

## 2023-02-14 LAB — PHOSPHORUS
Phosphorus: 3.1 mg/dL (ref 2.5–4.6)
Phosphorus: 2.9 mg/dL (ref 2.5–4.6)

## 2023-02-14 LAB — LAMOTRIGINE LEVEL: Lamotrigine Lvl: <1 ug/mL — ABNORMAL LOW (ref 2.0–20.0)

## 2023-02-14 LAB — GLUCOSE, CAPILLARY
Glucose-Capillary: 116 mg/dL — ABNORMAL HIGH (ref 70–99)
Glucose-Capillary: 95 mg/dL (ref 70–99)

## 2023-02-14 LAB — MAGNESIUM
Magnesium: 2.6 mg/dL — ABNORMAL HIGH (ref 1.7–2.4)
Magnesium: 2.4 mg/dL (ref 1.7–2.4)

## 2023-02-14 LAB — LACTIC ACID, PLASMA: Lactic Acid, Venous: 1.9 mmol/L (ref 0.5–1.9)

## 2023-02-14 LAB — TRIGLYCERIDES: Triglycerides: 86 mg/dL (ref <150)

## 2023-02-14 LAB — PROCALCITONIN: Procalcitonin: 1.32 ng/mL

## 2023-02-14 MED ORDER — PROSOURCE TF20 ENFIT COMPATIBL EN LIQD
60.0000 mL | Freq: Every day | ENTERAL | Status: DC
  Administered 2023-02-14 – 2023-02-15 (×2): 60 mL
  Filled 2023-02-14 (×2): qty 60

## 2023-02-14 MED ORDER — VANCOMYCIN HCL 1500 MG/300ML IV SOLN
1500.0000 mg | Freq: Once | INTRAVENOUS | Status: AC
  Administered 2023-02-14: 1500 mg via INTRAVENOUS
  Filled 2023-02-14: qty 300

## 2023-02-14 MED ORDER — PIPERACILLIN-TAZOBACTAM 3.375 G IVPB
3.3750 g | Freq: Three times a day (TID) | INTRAVENOUS | Status: DC
  Administered 2023-02-14 – 2023-02-15 (×3): 3.375 g via INTRAVENOUS
  Filled 2023-02-14 (×3): qty 50

## 2023-02-14 MED ORDER — VANCOMYCIN HCL 1.5 G IV SOLR
1500.0000 mg | Freq: Once | INTRAVENOUS | Status: DC
  Filled 2023-02-14: qty 30

## 2023-02-14 MED ORDER — SODIUM CHLORIDE 0.9 % IV SOLN
2.0000 g | INTRAVENOUS | Status: DC
  Administered 2023-02-14: 2 g via INTRAVENOUS
  Filled 2023-02-14: qty 20

## 2023-02-14 MED ORDER — GADOBUTROL 1 MMOL/ML IV SOLN
6.0000 mL | Freq: Once | INTRAVENOUS | Status: AC | PRN
  Administered 2023-02-14: 6 mL via INTRAVENOUS

## 2023-02-14 MED ORDER — DEXMEDETOMIDINE HCL IN NACL 400 MCG/100ML IV SOLN
0.0000 ug/kg/h | INTRAVENOUS | Status: DC
  Administered 2023-02-14: 0.4 ug/kg/h via INTRAVENOUS
  Administered 2023-02-15: 0.3 ug/kg/h via INTRAVENOUS
  Filled 2023-02-14 (×2): qty 100

## 2023-02-14 MED ORDER — FENTANYL CITRATE PF 50 MCG/ML IJ SOSY
PREFILLED_SYRINGE | INTRAMUSCULAR | Status: AC
  Filled 2023-02-14: qty 1

## 2023-02-14 MED ORDER — LACTATED RINGERS IV BOLUS
500.0000 mL | Freq: Once | INTRAVENOUS | Status: AC
  Administered 2023-02-14: 500 mL via INTRAVENOUS

## 2023-02-14 MED ORDER — POTASSIUM CHLORIDE 20 MEQ PO PACK
40.0000 meq | PACK | Freq: Once | ORAL | Status: AC
  Administered 2023-02-14: 40 meq
  Filled 2023-02-14: qty 2

## 2023-02-14 MED ORDER — VANCOMYCIN HCL 1000 MG IV SOLR
750.0000 mg | Freq: Two times a day (BID) | INTRAVENOUS | Status: DC
  Administered 2023-02-15: 750 mg via INTRAVENOUS
  Filled 2023-02-14 (×2): qty 15

## 2023-02-14 MED ORDER — FENTANYL CITRATE PF 50 MCG/ML IJ SOSY
50.0000 ug | PREFILLED_SYRINGE | INTRAMUSCULAR | Status: DC | PRN
  Administered 2023-02-14 – 2023-02-15 (×2): 100 ug via INTRAVENOUS
  Administered 2023-02-15: 50 ug via INTRAVENOUS
  Administered 2023-02-15: 100 ug via INTRAVENOUS
  Administered 2023-02-15: 50 ug via INTRAVENOUS
  Filled 2023-02-14: qty 1
  Filled 2023-02-14: qty 2
  Filled 2023-02-14: qty 1
  Filled 2023-02-14 (×2): qty 2

## 2023-02-14 MED ORDER — TOPIRAMATE 25 MG PO TABS
50.0000 mg | ORAL_TABLET | Freq: Two times a day (BID) | ORAL | Status: DC
  Administered 2023-02-14 – 2023-02-15 (×2): 50 mg
  Filled 2023-02-14 (×2): qty 2

## 2023-02-14 MED ORDER — FENTANYL CITRATE PF 50 MCG/ML IJ SOSY
50.0000 ug | PREFILLED_SYRINGE | INTRAMUSCULAR | Status: AC | PRN
  Administered 2023-02-14 – 2023-02-15 (×3): 50 ug via INTRAVENOUS
  Filled 2023-02-14 (×4): qty 1

## 2023-02-14 MED ORDER — VANCOMYCIN HCL 750 MG/150ML IV SOLN
750.0000 mg | Freq: Two times a day (BID) | INTRAVENOUS | Status: DC
  Filled 2023-02-14: qty 150

## 2023-02-14 MED ORDER — MAGNESIUM SULFATE 2 GM/50ML IV SOLN
2.0000 g | Freq: Once | INTRAVENOUS | Status: AC
  Administered 2023-02-14: 2 g via INTRAVENOUS
  Filled 2023-02-14: qty 50

## 2023-02-14 MED ORDER — OSMOLITE 1.5 CAL PO LIQD
1000.0000 mL | ORAL | Status: DC
  Administered 2023-02-14: 1000 mL

## 2023-02-14 NOTE — Progress Notes (Signed)
PCCM Progress Note   Sacral Pressure Injury Wound care RN consulted, per assessment- wound appears to tunnel and possibly to bone, with minimal about of puss  Patient is immunocompromised due to MS infusions  P: Stat MRI of sacrum to evaluate  Wound culture aerbobic/anaerobic swab  Obtain BC x 2 Order procal  DC ceftriaxone Pharm consult for vanc and zosyn  May need surgical consult depending on imaging    Christian Clarissia Mckeen AGACNP-BC   [facility] 02/14/2023, 12:05 PM  Please see [email or web address] for pager details.  From 7A-7P if no response, please call [phone]. After hours, please call ELink [phone].

## 2023-02-14 NOTE — Progress Notes (Signed)
Initial Nutrition Assessment  DOCUMENTATION CODES:   Not applicable  INTERVENTION:  Initiate tube feeding via OG: Osmolite 1.5 at 55 ml/h (1320 ml per day) Start at 9ml/hr increase rate by 10ml every 6 hours till goal is met  Prosource TF20 60 ml daily  Provides 2160 kcal, 103 gm protein, 1005 ml free water daily    NUTRITION DIAGNOSIS:   Inadequate oral intake related to inability to eat (Intubated) as evidenced by NPO status.    GOAL:   Patient will meet greater than or equal to 90% of their needs    MONITOR:   Diet advancement, Vent status, TF tolerance  REASON FOR ASSESSMENT:   Consult Enteral/tube feeding initiation and management  ASSESSMENT: .  33 y.o. M, presented to ED with reported seizures. Intubated and placed on propfol. Admitted for status epilepticus. Per ED provider on arrival patient had left-sided gaze arms were contracted and had clonus with resistance provided to upper extremities.  PMH: seizure, MS, Status epilepticus, foot drop, acute encephalopathy.  Patient is currently intubated on ventilator support.  Propofol. Patient was admitted last year with similar issues. Review of EMR attempted to wean down propofol with reports of uneven pupils.  Reports to initiate TF if not extubated.   Temp (24hrs), Avg:98.9 F (37.2 C), Min:96.2 F (35.7 C), Max:100.8 F (38.2 C)  Propofol: 6.67 ml/hr  Admit weight: 62.7 kg  Weight history:  02/14/23 62.7 kg  11/24/22 74.1 kg  11/12/22 62 kg  10/06/22 65 kg  08/08/22 64.4 kg  06/16/22 70 kg  06/03/22 67.9 kg       Intake/Output Summary (Last 24 hours) at 02/14/2023 1230 Last data filed at 02/14/2023 1100 Gross per 24 hour  Intake 1949.11 ml  Output 2780 ml  Net -830.89 ml   Net IO Since Admission: -830.89 mL [02/14/23 1230]  Drains/Lines: OG vented/dual lumen 60cm, low intermittent suction.  Endotracheal tube Average Meal Intake: NPO  Nutritionally Relevant Medications: Scheduled  Meds:  docusate  100 mg Per Tube BID   pantoprazole (PROTONIX) IV  40 mg Intravenous QHS   potassium chloride  40 mEq Per Tube Once    Continuous Infusions:  magnesium sulfate bolus IVPB     propofol (DIPRIVAN) infusion 15 mcg/kg/min (02/14/23 0800)    Labs Reviewed    NUTRITION - FOCUSED PHYSICAL EXAM:  Flowsheet Row Most Recent Value  Orbital Region No depletion  Upper Arm Region Unable to assess  Thoracic and Lumbar Region Mild depletion  Buccal Region No depletion  Temple Region No depletion  Clavicle Bone Region Mild depletion  Clavicle and Acromion Bone Region No depletion  Scapular Bone Region Unable to assess  Dorsal Hand Unable to assess  Patellar Region Mild depletion  Anterior Thigh Region Unable to assess  Posterior Calf Region No depletion  Edema (RD Assessment) None  Hair Reviewed  Eyes Unable to assess  Mouth Unable to assess  Skin Reviewed  Nails Reviewed  [Toe nails unable to assess fingers due to mittens]       Diet Order:   Diet Order             Diet NPO time specified  Diet effective now                   EDUCATION NEEDS:      Skin:  Skin Assessment: Skin Integrity Issues: Skin Integrity Issues:: Other (Comment) Other: Slin tear gluteal fold  Last BM:  PTA  Height:   Ht Readings  from Last 1 Encounters:  02/13/23 5\' 11"  (1.803 m)    Weight:   Wt Readings from Last 1 Encounters:  02/14/23 62.7 kg    Ideal Body Weight:     BMI:  Body mass index is 19.28 kg/m.  Estimated Nutritional Needs:   Kcal:  1885-2200 kcal  Protein:  85-100g  Fluid:  44ml/kcal    Jamelle Haring RDN, LDN Clinical Dietitian   If unable to reach, please contact "RD Inpatient" secure chat group between 8 am-4 pm daily"

## 2023-02-14 NOTE — Progress Notes (Signed)
Pt transported to and from MRI on the ventilator.

## 2023-02-14 NOTE — Progress Notes (Signed)
I personally saw the patient and performed a substantive portion of this encounter, including a complete performance of at least one of the key components (MDM, Hx and/or Exam), in conjunction with the Advanced Practice Provider.   33 year old male with multiple sclerosis and seizure disorder, admitted 1/20 with status epilepticus He was intubated, sedated with propofol and LTM EEG was placed Thick secretions with concern for aspiration and chest x-ray showing retrocardiac infiltrate, Zosyn was added. Repeat head CT negative, obtained overnight due to unequal pupils  Low-grade febrile, no clinical seizures overnight, propofol has been tapered from 70 mics to 15 mics, he is weaning spontaneously on pressure support 5/5 Low-grade febrile, secretions persist per RN Good urine output  On exam -sedated, RASS -2, bilateral ventilated breath sounds, S1-S2 regular, soft nontender abdomen, no edema  Labs show mild hypokalemia 3.5, leukocytosis, slight increase in creatinine from 1.1-1.2.  Respiratory culture shows gram-negative rods. Chest x-ray 1/20 showed left retrocardiac infiltrate  Impression/plan  Status epilepticus -await LTM EEG read, if no seizures, can wean propofol to off  Acute respiratory insufficiency with aspiration pneumonia -Spontaneous breathing trials, plan to extubate when more awake and secretions decreased, likely 24 to 48 hours -Metabolic acidosis has resolved -Continue Zosyn for aspiration, await identity of gram-negative rods in sputum. -Start tube feeds if not extubated  My independent critical care time was 32 minutes  Nilza Eaker V. Vassie Loll MD

## 2023-02-14 NOTE — Consult Note (Addendum)
WOC Nurse Consult Note: Reason for Consult: Requested to assess a stage 2 pressure injury on sacrum.  Wound type: Pressure injury stage 4, has a narrow tunneling with no access, drainage pus when pressed the area. May goes to the bone.  Pressure Injury POA: Yes Measurement: 3x1cm with a tunneling (not able to measure). Wound bed: 100% red, pink. Drainage (amount, consistency, odor) minimal amount. Periwound: intact, altered black/purple skin surrounding. Dressing procedure/placement/frequency: With a swab, apply intrasite (#16109) daily on the tunneling at the middle of the wound bed. Pressing gently inside the opening. Apply Xeroform at the wound bed (change daily), cover with foam dressing, changing every 3 days or PRN soiling.  Sent it a secure chat to the medical team suggesting a further investigation about this wound.  WOC team will not plan to follow further.  Please reconsult if further assistance is needed. Thank-you,  Denyse Amass BSN, RN, [employer], WOC  (Pager: [phone])

## 2023-02-14 NOTE — Progress Notes (Signed)
Pt placed on PS/CPAP 5/5 on 40% and is tolerating well. Vitals stable.

## 2023-02-14 NOTE — Progress Notes (Signed)
eLink Physician-Brief Progress Note Patient Name: XYON DAFFERN DOB: 02-17-1990 MRN: [id number]   Date of Service  02/14/2023  HPI/Events of Note  RN called [place] with asymmetric pupils that were noticed when propofol was being tapered. Shift started with propfol running at 80 mic and was down to 30 mic this AM when right pupil was noted to be 3 mm and left 4 mm , reactive but sluggish. Cough and gag +, tough to assess other parts while sedated but Rns note prior exams on this shift and before had equal pupils throughout. No other changes in vital signs.   eICU Interventions  CT head stat RN has paged neuro as well - will see what they suggest      Intervention Category Major Interventions: Respiratory failure - evaluation and management;Seizures - evaluation and management  Oretha Milch 02/14/2023, 4:28 AM

## 2023-02-14 NOTE — Progress Notes (Addendum)
NAME:  Miguel Black, MRN:  [id number], DOB:  [birth year], LOS: 1 ADMISSION DATE:  02/13/2023, CONSULTATION DATE:  02/13/2023 REFERRING MD:  Earlene Plater, EDP, CHIEF COMPLAINT:  status epilepticus    History of Present Illness:  Patient intubated, sedated on initial exam.  HPI portion of note is obtained from chart review as well as discussion with EDP.  33 year old male with past medical history of multiple sclerosis, seizure disorder who presented to the emergency department on 02/13/2023 with seizure.  Was reportedly with a friend at home when began having seizure.  He received 15 mg of midazolam and 10 mg diazepam with EMS.  Per ED provider on arrival, patient had a left-sided gaze, arms were contracted and had clonus with resistance provided to upper extremities.  Presumed ongoing seizure and was given another 2 mg of Ativan and intubated for airway protection.  Was loaded with 4.5 g Keppra and placed on propofol for sedation/seizure suppression.  Neurology was consulted as well as PCCM for admission.  Appears that he was admitted on 11/12/2022-11/15/2022 for status epilepticus and was discharged on valproic acid 500 mg twice daily, lamotrigine up taper, Ocrevus injections for MS and diazepam 5 mg and as needed.  He was to have follow-up with [facility] neurolog[facility], however he was seen by primary care on 11/24/2022 for hospital follow-up and taken off lamotrigine and placed on topiramate.  On my exam, intubated, sedated on propofol.  Pupils are 4 mm and reactive, no gaze deviation.  He does not withdraw to painful stimulus.  Being taken to CT scan.  Pertinent  Medical History  Multiple sclerosis, seizure disorder  Significant Hospital Events: Including procedures, antibiotic start and stop dates in addition to other pertinent events   1/20: Admit for status epilepticus 1/21 Intubated, sedated, cEEG, weaning on vent, CT scan negative for acute abnormality   Interim History / Subjective:  Overnight- pupils  asymmetric with left 4mm and right 3mm  Objective   Blood pressure (!) 133/90, pulse 70, temperature 98.1 F (36.7 C), resp. rate 18, height 5\' 11"  (1.803 m), weight 62.7 kg, SpO2 99%.    Vent Mode: PRVC FiO2 (%):  [35 %-100 %] 35 % Set Rate:  [24 bmp-26 bmp] 24 bmp Vt Set:  [600 mL] 600 mL PEEP:  [5 cmH20] 5 cmH20 Plateau Pressure:  [18 cmH20-21 cmH20] 21 cmH20   Intake/Output Summary (Last 24 hours) at 02/14/2023 0725 Last data filed at 02/14/2023 0700 Gross per 24 hour  Intake 1511.51 ml  Output 1960 ml  Net -448.49 ml   Filed Weights   02/14/23 0500  Weight: 62.7 kg    Examination General: acutely ill young male, on vent, lying on ICU bed HEENT: Normocephalic, PERRLA intact, 2+ pupils equal, 2+, Pink MM  Pulm: clear, diminished throughout Cards: s1,s2, RRR, no JVD, MRG  Abdomen: soft, BS active, non tender Extremities: moves all extremities to painful stimuli, right > left upper extremities in regards to weakness  Skin: healing stage 2 on sacrum POA GU: Foley intact, yellow clear   Resolved Hospital Problem list    Assessment & Plan:  Status epilepticus Seizure disorder Unsure how long he was seizing for prior to arrival.  Initially given 15 mg midazolam, 10 mg diazepam and then 2 mg of Ativan on arrival to the emergency department.  Reported clonus and left-sided gaze when he arrived.   No reported alcohol use, illicit drug use or head trauma, recent illness. S/p 4.5g Keppra load. Lactic 4.2, WBC 18, consistent  with recent seizure activity.  Concern for medication compliance vs MRI from 11/14/22 with stable MS lesions.  CT head neg, Valproic Acid <10, ammonia 42  ASA, Acetaminophen and Etoh Level WNL CK 151, UDS negative, Mag 1.9, K 3.5  P:  Neuro following appreciate recs Continue Valproate q 6hrs  Continue EEG Continue PAD protocol, continue minimal sedation-propofol  Continue neuro checks  Continue seizure precautions Continue to optimize electrolytes, trend  daily  Give 2g of Mag IV, replace K one time with 40 meq per tube  Once extubated need inquire about medication use prior to hospitalization  Will need TOC consult for possible medication assistance   Acute hypoxic respiratory failure 2/2 status epilepticus  Intubated for airway protection, obtundation  Concern for aspiration event/aspiration pna due to increase in WBC and febrile overnight with response to tylenol  P:  Add aspiration coverage-rocephin  Continue full vent support with SBT/WUA if no seizure activity per EEG Continue LTVV 6-8 cc Continue VAP and PAD protocol HOB > 30, plat pressures < 30 Tracheal aspirate culture pending   Multiple sclerosis  On Ocrevus injections  Per PCP, referral to Neurology was made, patient had not seen yet  P:  Neuro following appreciate assistance and recs Restart home medications when appropriate   Sacral decubitus POA  stage 2 on admission  P: Wound Consult  q 2 hr turns     Best Practice (right click and "Reselect all SmartList Selections" daily)   Diet/type: NPO DVT prophylaxis: prophylactic heparin  Pressure ulcer(s): none GI prophylaxis: PPI Lines: N/A Foley:  Yes, and it is still needed Code Status:  full code Last date of multidisciplinary goals of care discussion [pending]  Critical care time: 40 mins    Christian Mildred Bollard AGACNP-BC   [facility] 02/14/2023, 8:42 AM  Please see [email or web address] for pager details.  From 7A-7P if no response, please call [phone]. After hours, please call ELink [phone].

## 2023-02-14 NOTE — Progress Notes (Signed)
LTM maint complete - no skin breakdown under: F3,F4

## 2023-02-14 NOTE — Progress Notes (Signed)
Subjective: No further seizures overnight.  Intubated and sedated on propofol.   ROS: Unable to obtain due to intubation and sedation  Examination  Vital signs in last 24 hours: Temp:  [96.2 F (35.7 C)-100.8 F (38.2 C)] 100 F (37.8 C) (01/21 1300) Pulse Rate:  [64-125] 86 (01/21 1300) Resp:  [10-36] 15 (01/21 1020) BP: (94-179)/(58-117) 141/88 (01/21 1300) SpO2:  [87 %-100 %] 100 % (01/21 1300) FiO2 (%):  [35 %-100 %] 40 % (01/21 1020) Weight:  [62.7 kg] 62.7 kg (01/21 0500)  General: lying in bed, intubated Neuro: On propofol at 20 mcg/kg/h, barely opens eyes to noxious stimulation, does not follow commands, disconjugate gaze without any forced gaze deviation, withdraws to noxious stimuli in all 4 extremities.  Per RN when propofol was held, patient did start to wake up but did not follow commands.  Basic Metabolic Panel: Recent Labs  Lab 02/13/23 1501 02/13/23 1533 02/13/23 1640 02/13/23 1831 02/13/23 2047 02/14/23 0511  NA 139 142  141 140 138 140 141  K 4.0 3.9  3.9 3.9 4.1 3.1* 3.5  CL 109 111  --  109  --  110  CO2 16*  --   --  19*  --  17*  GLUCOSE 163* 162*  --  111*  --  89  BUN 10 10  --  10  --  8  CREATININE 1.13 1.00  --  1.16  --  1.26*  CALCIUM 8.6*  --   --  8.9  --  8.5*  MG 2.0  --   --  1.9  --   --     CBC: Recent Labs  Lab 02/13/23 1501 02/13/23 1533 02/13/23 1640 02/13/23 2047 02/14/23 0511  WBC 18.9*  --   --   --  23.5*  NEUTROABS 16.6*  --   --   --   --   HGB 15.2 16.0  15.6 15.0 14.6 15.3  HCT 46.1 47.0  46.0 44.0 43.0 46.0  MCV 97.5  --   --   --  96.6  PLT 301  --   --   --  230     Coagulation Studies: No results for input(s): "LABPROT", "INR" in the last 72 hours.  Imaging personally reviewed  CT head without contrast 02/13/2022: No acute intracranial abnormality by CT. Chronic multiple sclerosis.  ASSESSMENT AND PLAN: 33 year old male with history of epilepsy who presented with breakthrough seizures in the setting  of medication noncompliance (Depakote level less than 10)  Epilepsy with breakthrough seizures -No further seizures overnight  Recommendations -Okay to wean propofol at 5mg /kg/hr to stop after MRI -Okay to use Precedex or fentanyl for agitation/sedation -Continue Depakote 250 mg every 6 hours.  Will check Depakote and ammonia level tomorrow morning. -If patient continues to be hyperammonemic, may need to transition to different antiseizure medication like Onfi -Discussed plan with ICU team  CRITICAL CARE Performed by: Charlsie Quest   Total critical care time: 35 minutes  Critical care time was exclusive of separately billable procedures and treating other patients.  Critical care was necessary to treat or prevent imminent or life-threatening deterioration.  Critical care was time spent personally by me on the following activities: development of treatment plan with patient and/or surrogate as well as nursing, discussions with consultants, evaluation of patient's response to treatment, examination of patient, obtaining history from patient or surrogate, ordering and performing treatments and interventions, ordering and review of laboratory studies, ordering and review of radiographic  studies, pulse oximetry and re-evaluation of patient's condition.    Lindie Spruce Epilepsy [facility] For questions after 5pm please refer to AMION to reach the Neurologist on call

## 2023-02-14 NOTE — Progress Notes (Signed)
Patient still down in MRI. EEG re-hook may take place when third shift arrives.

## 2023-02-14 NOTE — Progress Notes (Signed)
PT rehooked to EEG. After discussing pt care needs with RN, leads pasted. [facility] notified, RN notified and will monitor in office to verify electrode integrity.

## 2023-02-14 NOTE — Procedures (Signed)
Patient Name: Miguel Black  MRN: [id number]  Epilepsy Attending: Charlsie Quest  Referring Physician/Provider: Gordy Councilman, MD  Duration: 02/13/2023 1718 to 02/14/2023 1930  Patient history: 33 year old male with history of epilepsy presented with breakthrough seizures.  EEG to evaluate for seizure.  Level of alertness:  comatose  AEDs during EEG study: Depakote, propofol  Technical aspects: This EEG study was done with scalp electrodes positioned according to the 10-20 International system of electrode placement. Electrical activity was reviewed with band pass filter of 1-70Hz , sensitivity of 7 uV/mm, display speed of 66mm/sec with a 60Hz  notched filter applied as appropriate. EEG data were recorded continuously and digitally stored.  Video monitoring was available and reviewed as appropriate.  Description: EEG showed continuous generalized 3 to 6 Hz theta-delta slowing with overriding 15 to 18 Hz beta activity distributed symmetrically and diffusely. Hyperventilation and photic stimulation were not performed.     EEG was disconnected on 02/14/2023 between 1522 to 1835 for imaging  ABNORMALITY - Continuous slow, generalized  IMPRESSION: This study is suggestive of severe diffuse encephalopathy likely related to sedation. No seizures or epileptiform discharges were seen throughout the recording.  Karey Stucki [name]

## 2023-02-14 NOTE — Progress Notes (Signed)
Pharmacy Antibiotic Note  Miguel Black is a 33 y.o. male admitted on 02/13/2023 with   osteomyelitis and aspiration pneumonia .  Pharmacy has been consulted for vancomycin and zosyn dosing.  Upon examination this morning, pt noted to have a sacral decubitis wound with concerns for osteomyelitis with drainage. Noted that patient is on ocrelizumab an immunosuppressant for MS prior to admission. WBC 23.5, tmax 100.2, scr 1.26.   Plan: Vancomycin 1500 mg x 1 load Then, start vancomycin 750 mg IV every 12 hours (eAUC 496, Scr 1.26, TBW)  Start Zosyn 3.375 g IV every 8 hours  Monitor cultures, renal function, and overall clinical picture  F/u LOT   Height: 5\' 11"  (180.3 cm) Weight: 62.7 kg (138 lb 3.7 oz) IBW/kg (Calculated) : 75.3  Temp (24hrs), Avg:98.9 F (37.2 C), Min:96.2 F (35.7 C), Max:100.8 F (38.2 C)  Recent Labs  Lab 02/13/23 1501 02/13/23 1533 02/13/23 1621 02/13/23 1831 02/14/23 0511  WBC 18.9*  --   --   --  23.5*  CREATININE 1.13 1.00  --  1.16 1.26*  LATICACIDVEN  --  4.2* 3.3* 2.4*  --     Estimated Creatinine Clearance: 75.3 mL/min (A) (by C-G formula based on SCr of 1.26 mg/dL (H)).    Allergies  Allergen Reactions   Ampyra [Dalfampridine] Other (See Comments)    Seizure 02/2021 while on dalfampridine    Antimicrobials this admission: Rocephin 1/21 x 1 Vancomycin 1/21 >>  Zosyn 1/21 >>    Microbiology results: 1/21 BCx: sent 1/21 sacral wound cx: sent 1/21 TA: few gram positive cocci in chains, rare gram neg rods on gram stain 1/21 MRSA PCR: neg  Thank you for allowing pharmacy to be a part of this patient's care.  Griffin Dakin 02/14/2023 11:47 AM

## 2023-02-14 NOTE — Progress Notes (Addendum)
Noted Pupillary changes. Initial assessment pupils 2mm, equal and sluggish. Minimal movement extremities, flicker to painful stimulation. When attempting to come down on sedation to receive a more accurate neuro exam, patient noted to have unequal pupils, left 4mm, right 3mm, sluggish. Remainder of neuro exam unchanged.   CCM, and Neurology Dr. Otelia Limes notified.   Orders for STAT head CT and continued close neuro assessments   Signed,  Charmian Muff, RN  6:35 AM 02/14/2023

## 2023-02-14 NOTE — TOC CM/SW Note (Signed)
Transition of Care [facility]) - Inpatient Brief Assessment   Patient Details  Name: Miguel Black MRN: [id number] Date of Birth: [birth year]  [facility] CM/SW Contact:    Mearl Latin, LCSW Phone Number: 02/14/2023, 9:49 AM   Clinical Narrative: Patient admitted from home with parents and is currently intubated. No current TOC needs identified at this time but please place consult as needed. He discharged from the hospital in October 2024 with [facility]    Transition of Care Asessment: Insurance and Status: Insurance coverage has been reviewed Patient has primary care physician: Yes Home environment has been reviewed: From home Prior level of function:: Mod Independent Prior/Current Home Services: No current home services Social Drivers of Health Review: SDOH reviewed no interventions necessary Readmission risk has been reviewed: Yes Transition of care needs: no transition of care needs at this time

## 2023-02-15 ENCOUNTER — Inpatient Hospital Stay (HOSPITAL_COMMUNITY): Payer: Medicare Other

## 2023-02-15 DIAGNOSIS — J9601 Acute respiratory failure with hypoxia: Secondary | ICD-10-CM | POA: Diagnosis not present

## 2023-02-15 DIAGNOSIS — T17908A Unspecified foreign body in respiratory tract, part unspecified causing other injury, initial encounter: Secondary | ICD-10-CM

## 2023-02-15 DIAGNOSIS — G40901 Epilepsy, unspecified, not intractable, with status epilepticus: Secondary | ICD-10-CM | POA: Diagnosis not present

## 2023-02-15 LAB — BASIC METABOLIC PANEL
Anion gap: 9 (ref 5–15)
BUN: 12 mg/dL (ref 6–20)
CO2: 19 mmol/L — ABNORMAL LOW (ref 22–32)
Calcium: 8.2 mg/dL — ABNORMAL LOW (ref 8.9–10.3)
Chloride: 111 mmol/L (ref 98–111)
Creatinine, Ser: 1.11 mg/dL (ref 0.61–1.24)
GFR, Estimated: >60 mL/min (ref >60)
Glucose, Bld: 104 mg/dL — ABNORMAL HIGH (ref 70–99)
Potassium: 3.5 mmol/L (ref 3.5–5.1)
Sodium: 139 mmol/L (ref 135–145)

## 2023-02-15 LAB — CULTURE, RESPIRATORY W GRAM STAIN

## 2023-02-15 LAB — GLUCOSE, CAPILLARY
Glucose-Capillary: 137 mg/dL — ABNORMAL HIGH (ref 70–99)
Glucose-Capillary: 116 mg/dL — ABNORMAL HIGH (ref 70–99)
Glucose-Capillary: 79 mg/dL (ref 70–99)
Glucose-Capillary: 84 mg/dL (ref 70–99)
Glucose-Capillary: 122 mg/dL — ABNORMAL HIGH (ref 70–99)
Glucose-Capillary: 94 mg/dL (ref 70–99)

## 2023-02-15 LAB — CBC
HCT: 40.1 % (ref 39.0–52.0)
Hemoglobin: 13.5 g/dL (ref 13.0–17.0)
MCH: 32.1 pg (ref 26.0–34.0)
MCHC: 33.7 g/dL (ref 30.0–36.0)
MCV: 95.2 fL (ref 80.0–100.0)
Platelets: 212 10*3/uL (ref 150–400)
RBC: 4.21 MIL/uL — ABNORMAL LOW (ref 4.22–5.81)
RDW: 13.6 % (ref 11.5–15.5)
WBC: 17.4 10*3/uL — ABNORMAL HIGH (ref 4.0–10.5)
nRBC: 0 % (ref 0.0–0.2)

## 2023-02-15 LAB — MAGNESIUM: Magnesium: 2.3 mg/dL (ref 1.7–2.4)

## 2023-02-15 LAB — VALPROIC ACID LEVEL: Valproic Acid Lvl: 68 ug/mL (ref 50.0–100.0)

## 2023-02-15 LAB — AMMONIA: Ammonia: 82 umol/L — ABNORMAL HIGH (ref 9–35)

## 2023-02-15 LAB — PHOSPHORUS: Phosphorus: 3.7 mg/dL (ref 2.5–4.6)

## 2023-02-15 MED ORDER — LACTATED RINGERS IV SOLN: INTRAVENOUS | Status: DC

## 2023-02-15 MED ORDER — ONDANSETRON HCL 4 MG/2ML IJ SOLN
4.0000 mg | Freq: Four times a day (QID) | INTRAMUSCULAR | Status: DC | PRN
  Administered 2023-02-15: 4 mg via INTRAVENOUS
  Filled 2023-02-15: qty 2

## 2023-02-15 MED ORDER — TOPIRAMATE 25 MG PO TABS
100.0000 mg | ORAL_TABLET | Freq: Two times a day (BID) | ORAL | Status: DC
  Administered 2023-02-15 – 2023-02-16 (×2): 100 mg
  Filled 2023-02-15 (×2): qty 4

## 2023-02-15 MED ORDER — POTASSIUM CHLORIDE 10 MEQ/100ML IV SOLN
10.0000 meq | INTRAVENOUS | Status: AC
  Administered 2023-02-15 (×4): 10 meq via INTRAVENOUS
  Filled 2023-02-15 (×4): qty 100

## 2023-02-15 MED ORDER — ORAL CARE MOUTH RINSE: 15.0000 mL | OROMUCOSAL | Status: DC | PRN

## 2023-02-15 MED ORDER — DEXTROSE 5 % IV SOLN
250.0000 mg | Freq: Two times a day (BID) | INTRAVENOUS | Status: DC
  Administered 2023-02-15 – 2023-02-17 (×4): 250 mg via INTRAVENOUS
  Filled 2023-02-15 (×6): qty 2.5

## 2023-02-15 MED ORDER — POLYETHYLENE GLYCOL 3350 17 G PO PACK
17.0000 g | PACK | Freq: Every day | ORAL | Status: DC
  Administered 2023-02-15 – 2023-02-17 (×3): 17 g
  Filled 2023-02-15 (×3): qty 1

## 2023-02-15 MED ORDER — LACTATED RINGERS IV SOLN: INTRAVENOUS | Status: AC

## 2023-02-15 MED ORDER — SODIUM CHLORIDE 0.9 % IV SOLN
2.0000 g | INTRAVENOUS | Status: DC
  Administered 2023-02-15 – 2023-02-17 (×3): 2 g via INTRAVENOUS
  Filled 2023-02-15 (×3): qty 20

## 2023-02-15 NOTE — Progress Notes (Signed)
I personally saw the patient and performed a substantive portion of this encounter, including a complete performance of at least one of the key components (MDM, Hx and/or Exam), in conjunction with the Advanced Practice Provider.   33 year old male with multiple sclerosis and seizure disorder, admitted 1/20 with status epilepticus He was intubated, sedated with propofol and LTM EEG was placed Thick secretions with concern for aspiration and chest x-ray showing retrocardiac infiltrate, Zosyn was added. 1/21 Repeat head CT negative, obtained overnight due to unequal pupils 1/21 MRI sacrum did not show any evidence of osteomyelitis  LTM EEG last 24 hours shows occasional spikes bilateral frontotemporal regions but no seizures Episode of vomiting early morning, tube feeds held, OG tube noted to be out X-ray abdomen shows gaseous distention of stomach and small bowel but no evidence of bowel obstruction.  Afebrile, urine output low in the second shift but 1.6 L last 24 hours On exam -awake on low-dose sedation, follows one-step commands, good cough, minimal secretions, bilateral ventilated breath sounds, S1-S2 regular, soft nontender abdomen, 1+ edema   Labs show mild hypokalemia 3.5, BUN/creatinine 12/1.1, decreased leukocytosis, Respiratory cultures show rare GNR.  Impression/plan  Status epilepticus -resolved , reducing valproate per neurology and adding Topamax due to hyperammonemia  Acute respiratory failure with hypoxia -Spontaneous breathing trials with goal extubation. Continue Zosyn for aspiration await respiratory culture  Hypokalemia will be repleted  Mild AKI -improving, continue IV fluids into 1 L then discontinue  Sacral decubitus POA -no evidence of osteomyelitis, wound care  My independent critical care time was 34 minutes  Jancie Kercher V. Vassie Loll MD

## 2023-02-15 NOTE — Progress Notes (Addendum)
   02/15/23 0210  Vitals  Pulse Rate (!) 46 (CCM made aware of New Bradycardia)  ECG Heart Rate (!) 46   Orders to Notify E-link for MAP < 65 or heart rate persistently < 45. And to titrate down on Precedex.

## 2023-02-15 NOTE — Procedures (Addendum)
Patient Name: Miguel Black  MRN: [id number]  Epilepsy Attending: Charlsie Quest  Referring Physician/Provider: Gordy Councilman, MD  Duration: 02/13/2023 1930 to 02/15/2023  1150   Patient history: 33 year old male with history of epilepsy presented with breakthrough seizures.  EEG to evaluate for seizure.   Level of alertness: lethargic   AEDs during EEG study: Depakote, TPM   Technical aspects: This EEG study was done with scalp electrodes positioned according to the 10-20 International system of electrode placement. Electrical activity was reviewed with band pass filter of 1-70Hz , sensitivity of 7 uV/mm, display speed of 83mm/sec with a 60Hz  notched filter applied as appropriate. EEG data were recorded continuously and digitally stored.  Video monitoring was available and reviewed as appropriate.   Description: EEG showed continuous generalized 3 to 6 Hz theta-delta slowing with overriding 15 to 18 Hz beta activity distributed symmetrically and diffusely.  Independent spikes were noted in left more than right frontotemporal region. Hyperventilation and photic stimulation were not performed.      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  Additionally there was moderate diffuse encephalopathy likely related to sedation.  No seizures were seen throughout the recording.       Selita Staiger [name]

## 2023-02-15 NOTE — Progress Notes (Signed)
NAME:  Miguel Black, MRN:  [id number], DOB:  Dec 02, 1990, LOS: 2 ADMISSION DATE:  02/13/2023, CONSULTATION DATE:  02/13/2023 REFERRING MD:  Earlene Plater, EDP, CHIEF COMPLAINT:  status epilepticus    History of Present Illness:  Patient intubated, sedated on initial exam.  HPI portion of note is obtained from chart review as well as discussion with EDP.  33 year old male with past medical history of multiple sclerosis, seizure disorder who presented to the emergency department on 02/13/2023 with seizure.  Was reportedly with a friend at home when began having seizure.  He received 15 mg of midazolam and 10 mg diazepam with EMS.  Per ED provider on arrival, patient had a left-sided gaze, arms were contracted and had clonus with resistance provided to upper extremities.  Presumed ongoing seizure and was given another 2 mg of Ativan and intubated for airway protection.  Was loaded with 4.5 g Keppra and placed on propofol for sedation/seizure suppression.  Neurology was consulted as well as PCCM for admission.  Appears that he was admitted on 11/12/2022-11/15/2022 for status epilepticus and was discharged on valproic acid 500 mg twice daily, lamotrigine up taper, Ocrevus injections for MS and diazepam 5 mg and as needed.  He was to have follow-up with [facility] neurology, however he was seen by primary care on 11/24/2022 for hospital follow-up and taken off lamotrigine and placed on topiramate.  On my exam, intubated, sedated on propofol.  Pupils are 4 mm and reactive, no gaze deviation.  He does not withdraw to painful stimulus.  Being taken to CT scan.  Pertinent  Medical History  Multiple sclerosis, seizure disorder  Significant Hospital Events: Including procedures, antibiotic start and stop dates in addition to other pertinent events   1/20: Admit for status epilepticus 1/21 Intubated, sedated, cEEG, weaning on vent, CT scan negative for acute abnormality  1/22 MRI of sacrum-negative for osteo, hypotensive  overnight-fluid responsive. Following commands   Interim History / Subjective:  Overnight events-some bradycardia due to precedex  Hypotensive-fluid boluses given   Objective   Blood pressure 99/64, pulse 72, temperature (!) 97.4 F (36.3 C), temperature source Axillary, resp. rate (!) 23, height 5\' 11"  (1.803 m), weight 63 kg, SpO2 100%.    Vent Mode: PRVC FiO2 (%):  [40 %-100 %] 40 % Set Rate:  [24 bmp] 24 bmp Vt Set:  [600 mL] 600 mL PEEP:  [5 cmH20] 5 cmH20 Pressure Support:  [5 cmH20] 5 cmH20 Plateau Pressure:  [17 cmH20-20 cmH20] 20 cmH20   Intake/Output Summary (Last 24 hours) at 02/15/2023 4098 Last data filed at 02/15/2023 0700 Gross per 24 hour  Intake 4061.07 ml  Output 1305 ml  Net 2756.07 ml   Filed Weights   02/14/23 0500 02/15/23 0500  Weight: 62.7 kg 63 kg    Examination General: acutely ill adult male, lying on ICU bed, on vent HEENT: Normocephalic, pink MM, ETT, OG, EEG electrodes on scalp Pulm: clear BL breath sounds throughout, no distress, on PS mode Cards: s1,s2, RRR, no JVD, no MRG  Abdomen: BS, soft, active  Extremities: moves all extremities, weaker on left than right-per mother this is baseline due to MS  Skin: sacral pressure wound stage 2-covered with dressing GU: Foley, clear yellow   Resolved Hospital Problem list    Assessment & Plan:  Status epilepticus Seizure disorder Hyperammonemia  Unsure how long he was seizing for prior to arrival.  Initially given 15 mg midazolam, 10 mg diazepam and then 2 mg of Ativan on arrival  to the emergency department.  Reported clonus and left-sided gaze when he arrived.   No reported alcohol use, illicit drug use or head trauma, recent illness. S/p 4.5g Keppra load. Lactic 4.2, WBC 18, consistent with recent seizure activity.  Concern for medication compliance vs MRI from 11/14/22 with stable MS lesions.  CT head neg, Valproic Acid <10, ammonia 42  ASA, Acetaminophen and Etoh Level WNL CK 151, UDS  negative, Mag 1.9, K 3.5  P:  Neuro following appreciate recs Per neuro decreasing depakote, increasing topamax due to hyperammonemia  EEG ongoing Continue PAD protocol, wean off precedex as tolerated  Continue seizure precautions, continue neuro checks Continue to optimize electrolytes  Order Ionized calcium  TOC consult place for possible medication assistance  Continue to trend ammonia   Acute hypoxic respiratory failure 2/2 status epilepticus  Intubated for airway protection, obtundation  Concern for aspiration event/aspiration pna due to increase in WBC and febrile overnight with response to tylenol  Tracheal aspirate- few gram + cocci, rare gram - rods Cx normal respiratory flora P:  Continue aspiration coverage with zosyn due to immunocompromised  Vanc dc'd due to no evidence of osteomyelitis  Continue full vent support with SBT/WUA, plan to wean to extubate patient  Continue LTVV 6-8cc  Continue VAP and PAD protocol  Continue HOB > 30, plate pressures < 30  Sacral decubitus POA Concern for sepsis  stage 2 on admission Wound care RN recommended further imaging due to possible tunneling bone. MRI obtained-negative for osteo WBC trending down, hypotensive previous shift-responsive to IV fluids  P: Continue Wound care following Continue q 2 hr turns Continue wound care per recs Continue zosyn, dc vanc  LR continuous IV fluids 67ml/hr   Multiple sclerosis  Immunocompromised due to receiving Ocrevus infusions ~ 6 months  Per PCP, referral to Neurology was made, patient had not seen yet  P:  Neuro following appreciate assistance and recs Restart home medications when appropriate    Best Practice (right click and "Reselect all SmartList Selections" daily)   Diet/type: NPO DVT prophylaxis: prophylactic heparin  Pressure ulcer(s): none GI prophylaxis: PPI Lines: N/A Foley:  Yes, and it is still needed Code Status:  full code Last date of multidisciplinary goals of  care discussion-updated mother on 1/21, updated patient on 1/22 about possible extubation   Critical care time: 40 mins    Christian Krystyne Tewksbury AGACNP-BC   [facility] 02/15/2023, 8:53 AM  Please see [email or web address] for pager details.  From 7A-7P if no response, please call [phone]. After hours, please call ELink [phone].

## 2023-02-15 NOTE — Progress Notes (Signed)
LTM EEG discontinued - no skin breakdown at unhook.   

## 2023-02-15 NOTE — Progress Notes (Signed)
Subjective: Bradycardic overnight.  No clinical seizures.  ROS: Unable to obtain due to intubation  Examination  Vital signs in last 24 hours: Temp:  [96.8 F (36 C)-100 F (37.8 C)] 97.4 F (36.3 C) (01/22 0800) Pulse Rate:  [46-116] 69 (01/22 1000) Resp:  [12-23] 23 (01/22 0310) BP: (84-153)/(55-114) 99/59 (01/22 1000) SpO2:  [98 %-100 %] 100 % (01/22 1000) FiO2 (%):  [40 %-100 %] 40 % (01/22 1104) Weight:  [63 kg] 63 kg (01/22 0500)  General: lying in bed, intubated Neuro: Awake, alert, following commands, nodding appropriately, cranial nerves appear grossly intact, antigravity strength in all 4 extremities  Basic Metabolic Panel: Recent Labs  Lab 02/13/23 1501 02/13/23 1533 02/13/23 1640 02/13/23 1831 02/13/23 2047 02/14/23 0511 02/14/23 1327 02/14/23 1657 02/15/23 0518  NA 139 142  141 140 138 140 141  --   --  139  K 4.0 3.9  3.9 3.9 4.1 3.1* 3.5  --   --  3.5  CL 109 111  --  109  --  110  --   --  111  CO2 16*  --   --  19*  --  17*  --   --  19*  GLUCOSE 163* 162*  --  111*  --  89  --   --  104*  BUN 10 10  --  10  --  8  --   --  12  CREATININE 1.13 1.00  --  1.16  --  1.26*  --   --  1.11  CALCIUM 8.6*  --   --  8.9  --  8.5*  --   --  8.2*  MG 2.0  --   --  1.9  --   --  2.6* 2.4 2.3  PHOS  --   --   --   --   --   --  3.1 2.9 3.7    CBC: Recent Labs  Lab 02/13/23 1501 02/13/23 1533 02/13/23 1640 02/13/23 2047 02/14/23 0511 02/15/23 0518  WBC 18.9*  --   --   --  23.5* 17.4*  NEUTROABS 16.6*  --   --   --   --   --   HGB 15.2 16.0  15.6 15.0 14.6 15.3 13.5  HCT 46.1 47.0  46.0 44.0 43.0 46.0 40.1  MCV 97.5  --   --   --  96.6 95.2  PLT 301  --   --   --  230 212     Coagulation Studies: No results for input(s): "LABPROT", "INR" in the last 72 hours.  Imaging No new brain imaging overnight  ASSESSMENT AND PLAN: 33-year-old male with history of epilepsy who presented with breakthrough seizures in the setting of medication  noncompliance (Depakote level less than 10)   Epilepsy with breakthrough seizures Hyperammonemia -No further seizures overnight -Hyperammonemia likely due to Depakote use   Recommendations -Reduce Depakote to 250 mg twice daily -Increase Topamax to 100 mg twice daily -Rescue medication: Intranasal Valtoco 15mg  for seizure lasting more than 2 minutes  -Will check ammonia levels again tomorrow morning.  If needed, may consider transitioning to different antiseizure medication like Onfi -DC LTM EEG -Continue seizure precautions -Continue to follow-up with [facility] -Discussed plan with ICU team  Seizure precautions: Per [facility] statutes, patients with seizures are not allowed to drive until they have been seizure-free for six months and cleared by a physician    Use caution when using heavy  equipment or power tools. Avoid working on ladders or at heights. Take showers instead of baths. Ensure the water temperature is not too high on the home water heater. Do not go swimming alone. Do not lock yourself in a room alone (i.e. bathroom). When caring for infants or small children, sit down when holding, feeding, or changing them to minimize risk of injury to the child in the event you have a seizure. Maintain good sleep hygiene. Avoid alcohol.    If patient has another seizure, call 911 and bring them back to the ED if: A.  The seizure lasts longer than 5 minutes.      B.  The patient doesn't wake shortly after the seizure or has new problems such as difficulty seeing, speaking or moving following the seizure C.  The patient was injured during the seizure D.  The patient has a temperature over 102 F (39C) E.  The patient vomited during the seizure and now is having trouble breathing    During the Seizure   - First, ensure adequate ventilation and place patients on the floor on their left side  Loosen clothing around the neck and ensure the airway is patent. If  the patient is clenching the teeth, do not force the mouth open with any object as this can cause severe damage - Remove all items from the surrounding that can be hazardous. The patient may be oblivious to what's happening and may not even know what he or she is doing. If the patient is confused and wandering, either gently guide him/her away and block access to outside areas - Reassure the individual and be comforting - Call 911. In most cases, the seizure ends before EMS arrives. However, there are cases when seizures may last over 3 to 5 minutes. Or the individual may have developed breathing difficulties or severe injuries. If a pregnant patient or a person with diabetes develops a seizure, it is prudent to call an ambulance.    After the Seizure (Postictal Stage)   After a seizure, most patients experience confusion, fatigue, muscle pain and/or a headache. Thus, one should permit the individual to sleep. For the next few days, reassurance is essential. Being calm and helping reorient the person is also of importance.   Most seizures are painless and end spontaneously. Seizures are not harmful to others but can lead to complications such as stress on the lungs, brain and the heart. Individuals with prior lung problems may develop labored breathing and respiratory distress.    I have spent a total of 36   minutes with the patient reviewing hospital notes,  test results, labs and examining the patient as well as establishing an assessment and plan.  > 50% of time was spent in direct patient care.         Lindie Spruce Epilepsy [facility] For questions after 5pm please refer to AMION to reach the Neurologist on call

## 2023-02-15 NOTE — Procedures (Signed)
Extubation Procedure Note  Patient Details:   Name: Miguel Black DOB: 1990-04-02 MRN: [id number]   Airway Documentation:    Vent end date: 02/15/23 Vent end time: 1319   Evaluation  O2 sats: stable throughout Complications: No apparent complications Patient did tolerate procedure well. Bilateral Breath Sounds: Clear, Diminished   Yes pt could speak post extubation. Pt extubated to [place] on 2L with no difficulties.   Audrie Lia 02/15/2023, 1:20 PM

## 2023-02-16 DIAGNOSIS — G40901 Epilepsy, unspecified, not intractable, with status epilepticus: Secondary | ICD-10-CM | POA: Diagnosis not present

## 2023-02-16 LAB — CBC WITH DIFFERENTIAL/PLATELET
Abs Immature Granulocytes: 0.03 10*3/uL (ref 0.00–0.07)
Basophils Absolute: 0 10*3/uL (ref 0.0–0.1)
Basophils Relative: 0 %
Eosinophils Absolute: 0 10*3/uL (ref 0.0–0.5)
Eosinophils Relative: 0 %
HCT: 43.4 % (ref 39.0–52.0)
Hemoglobin: 14.7 g/dL (ref 13.0–17.0)
Immature Granulocytes: 0 %
Lymphocytes Relative: 11 %
Lymphs Abs: 1.2 10*3/uL (ref 0.7–4.0)
MCH: 31.8 pg (ref 26.0–34.0)
MCHC: 33.9 g/dL (ref 30.0–36.0)
MCV: 93.9 fL (ref 80.0–100.0)
Monocytes Absolute: 0.6 10*3/uL (ref 0.1–1.0)
Monocytes Relative: 6 %
Neutro Abs: 8.5 10*3/uL — ABNORMAL HIGH (ref 1.7–7.7)
Neutrophils Relative %: 83 %
Platelets: 212 10*3/uL (ref 150–400)
RBC: 4.62 MIL/uL (ref 4.22–5.81)
RDW: 13.4 % (ref 11.5–15.5)
WBC: 10.4 10*3/uL (ref 4.0–10.5)
nRBC: 0 % (ref 0.0–0.2)

## 2023-02-16 LAB — GLUCOSE, CAPILLARY
Glucose-Capillary: 84 mg/dL (ref 70–99)
Glucose-Capillary: 80 mg/dL (ref 70–99)
Glucose-Capillary: 98 mg/dL (ref 70–99)
Glucose-Capillary: 118 mg/dL — ABNORMAL HIGH (ref 70–99)
Glucose-Capillary: 100 mg/dL — ABNORMAL HIGH (ref 70–99)

## 2023-02-16 LAB — MAGNESIUM: Magnesium: 2 mg/dL (ref 1.7–2.4)

## 2023-02-16 LAB — BASIC METABOLIC PANEL
Anion gap: 10 (ref 5–15)
BUN: 8 mg/dL (ref 6–20)
CO2: 21 mmol/L — ABNORMAL LOW (ref 22–32)
Calcium: 8.7 mg/dL — ABNORMAL LOW (ref 8.9–10.3)
Chloride: 110 mmol/L (ref 98–111)
Creatinine, Ser: 1.08 mg/dL (ref 0.61–1.24)
GFR, Estimated: >60 mL/min (ref >60)
Glucose, Bld: 96 mg/dL (ref 70–99)
Potassium: 3.5 mmol/L (ref 3.5–5.1)
Sodium: 141 mmol/L (ref 135–145)

## 2023-02-16 LAB — AMMONIA: Ammonia: 24 umol/L (ref 9–35)

## 2023-02-16 LAB — PHOSPHORUS: Phosphorus: 2.7 mg/dL (ref 2.5–4.6)

## 2023-02-16 MED ORDER — DOCUSATE SODIUM 100 MG PO CAPS
100.0000 mg | ORAL_CAPSULE | Freq: Two times a day (BID) | ORAL | Status: DC
  Administered 2023-02-16 – 2023-02-17 (×2): 100 mg via ORAL
  Filled 2023-02-16 (×2): qty 1

## 2023-02-16 MED ORDER — POTASSIUM CHLORIDE CRYS ER 20 MEQ PO TBCR
40.0000 meq | EXTENDED_RELEASE_TABLET | ORAL | Status: AC
  Administered 2023-02-16 (×2): 40 meq via ORAL
  Filled 2023-02-16 (×2): qty 2

## 2023-02-16 MED ORDER — TOPIRAMATE 25 MG PO TABS
100.0000 mg | ORAL_TABLET | Freq: Two times a day (BID) | ORAL | Status: DC
  Administered 2023-02-16 – 2023-02-17 (×2): 100 mg via ORAL
  Filled 2023-02-16 (×2): qty 4

## 2023-02-16 MED ORDER — BOOST / RESOURCE BREEZE PO LIQD CUSTOM
1.0000 | Freq: Three times a day (TID) | ORAL | Status: DC
  Administered 2023-02-16 – 2023-02-17 (×5): 1 via ORAL

## 2023-02-16 NOTE — Progress Notes (Signed)
  Inpatient Rehabilitation Admissions Coordinator   Met with patient at bedside for rehab assessment. I will follow up at bedside tomorrow for full assessment and contact his Mom to discuss rehab venue options. Please call me with any questions.   Ottie Glazier, RN, MSN Rehab Admissions Coordinator [phone]

## 2023-02-16 NOTE — Evaluation (Signed)
Physical Therapy Evaluation Patient Details Name: Miguel Black MRN: [id number] DOB: 02/08/1990 Today's Date: 02/16/2023  History of Present Illness  Miguel Black is a 33 y/o male admitted 02/13/23 after breakthrough generalized seizure; on arrival, patient had a left-sided gaze, arms were contracted and had clonus with resistance provided to upper extremities. Intubated 02/13/23-02/15/23. CT and MRI stable and indicative of MS without acute demyelination. Small superficial midline decubitus ulcer over the mid sacrum. PMH includes MS, seizure disorder.  Clinical Impression  Pt admitted with/for seizure described above.  Pt not at baseline function, needing  2 person mod the max assist for mobility and gait as he fatigued significantly.  Pt currently limited functionally due to the problems listed. ( See problems list.)   Pt will benefit from PT to maximize function and safety in order to get ready for next venue listed below.         If plan is discharge home, recommend the following: A lot of help with walking and/or transfers;A lot of help with bathing/dressing/bathroom;Assistance with cooking/housework;Direct supervision/assist for medications management;Direct supervision/assist for financial management;Assist for transportation   Can travel by private vehicle        Equipment Recommendations Other (comment);None recommended by PT (TBD)  Recommendations for Other Services  Rehab consult    Functional Status Assessment Patient has had a recent decline in their functional status and demonstrates the ability to make significant improvements in function in a reasonable and predictable amount of time.     Precautions / Restrictions Precautions Precautions: Fall Precaution Comments: seizures, L foot drop (baseline) Restrictions Weight Bearing Restrictions Per Provider Order: No      Mobility  Bed Mobility Overal bed mobility: Needs Assistance Bed Mobility: Supine to Sit, Sit  to Supine     Supine to sit: Mod assist, HOB elevated, Used rails, +2 for safety/equipment Sit to supine: +2 for safety/equipment, Max assist   General bed mobility comments: multimodal cues for sequencing, more assist for LLE on/off the bed, assist for trunk elevation - modified helicopter technique to return supine. Pt able to assist with bridging hips for pad straightening    Transfers Overall transfer level: Needs assistance Equipment used: 2 person hand held assist Transfers: Sit to/from Stand, Bed to chair/wheelchair/BSC Sit to Stand: Mod assist, +2 physical assistance, +2 safety/equipment (initially mod, progressed to max)   Step pivot transfers: Max assist, +2 physical assistance, +2 safety/equipment       General transfer comment: multimodal cues and initially mod A +2 progress/fatgiued to max A +2    Ambulation/Gait Ambulation/Gait assistance: Max assist, +2 physical assistance Gait Distance (Feet): 35 Feet Assistive device: 2 person hand held assist, IV Pole Gait Pattern/deviations: Step-to pattern, Step-through pattern, Decreased step length - right, Decreased step length - left, Decreased stance time - left, Decreased stride length   Gait velocity interpretation: <1.31 ft/sec, indicative of household ambulator   General Gait Details: pt just recently returned from walk in halls, so suspected already fatigued.  This round, gait quite uncoordinated with L LE trailing , but with poor place after swing on contact.  Maximal support needed on L side and mod/max support on R side with control on the IV pole.  Stairs            Wheelchair Mobility     Tilt Bed    Modified Rankin (Stroke Patients Only)       Balance Overall balance assessment: Needs assistance Sitting-balance support: Bilateral upper extremity supported, Feet supported Sitting  balance-Leahy Scale: Poor (CGA) Sitting balance - Comments: varied from CGA to min A for seated balance EOB    Standing balance support: Bilateral upper extremity supported, During functional activity Standing balance-Leahy Scale: Poor Standing balance comment: dependent on BUE from therapists - more assist on the L side needed                             Pertinent Vitals/Pain Pain Assessment Pain Assessment: No/denies pain    Home Living Family/patient expects to be discharged to:: Private residence Living Arrangements: Parent;Other relatives Available Help at Discharge: Available 24 hours/day;Family Type of Home: Other(Comment) Home Access: Stairs to enter Entrance Stairs-Rails: Right;Left;Can reach both Entrance Stairs-Number of Steps: 7 Alternate Level Stairs-Number of Steps: flight Home Layout: Two level;Bed/bath upstairs Home Equipment: Shower seat;Cane - quad;Rolling Walker (2 wheels);Grab bars - tub/shower;Wheelchair - manual;Cane - single point;BSC/3in1 Additional Comments: Mother works from home.    Prior Function Prior Level of Function : Independent/Modified Independent             Mobility Comments: no AD in home, when in community pt uses cane ADLs Comments: ind, does not drive, reports got his driving permit right before this happened     Extremity/Trunk Assessment   Upper Extremity Assessment Upper Extremity Assessment: Defer to OT evaluation LUE Deficits / Details: does not use functionally during session, moves against gravity - and can hold objects but did not perform Bil hand tasks LUE Sensation: decreased proprioception LUE Coordination: decreased fine motor;decreased gross motor    Lower Extremity Assessment Lower Extremity Assessment: RLE deficits/detail;LLE deficits/detail RLE Deficits / Details: general weakness, mild incoordination LLE Deficits / Details: moderate weakness and inccordination with difficulty in both stance and swinging through. LLE Coordination: decreased fine motor       Communication   Communication Communication: No  apparent difficulties;Other (comment) Cueing Techniques: Verbal cues;Gestural cues  Cognition Arousal: Alert Behavior During Therapy: WFL for tasks assessed/performed Overall Cognitive Status: No family/caregiver present to determine baseline cognitive functioning                                 General Comments: Pt pleasant and cooperative throughout session, slow to respond and largely answers questions reasonably - but at times answers some questions like "are you left or right handed" with "yes" need to establish baseline with family        General Comments General comments (skin integrity, edema, etc.): Pt pleasant and cooperative throughout session, no family present to determine baseline cognition or personality. When OT was in the room getting home set up questions LLE went rigid and started shaking. This lasted maybe 5 seconds.    Exercises     Assessment/Plan    PT Assessment Patient needs continued PT services  PT Problem List Decreased strength;Decreased activity tolerance;Decreased balance;Decreased mobility;Decreased coordination       PT Treatment Interventions DME instruction;Gait training;Functional mobility training;Therapeutic activities;Balance training;Neuromuscular re-education;Patient/family education    PT Goals (Current goals can be found in the Care Plan section)  Acute Rehab PT Goals Patient Stated Goal: none stated PT Goal Formulation: Patient unable to participate in goal setting Time For Goal Achievement: 03/02/23 Potential to Achieve Goals: Good    Frequency Min 1X/week     Co-evaluation PT/OT/SLP Co-Evaluation/Treatment: Yes Reason for Co-Treatment: Complexity of the patient's impairments (multi-system involvement);For patient/therapist safety;Necessary to address cognition/behavior during functional activity;To  address functional/ADL transfers PT goals addressed during session: Mobility/safety with  mobility;Balance;Strengthening/ROM OT goals addressed during session: ADL's and self-care;Strengthening/ROM       AM-PAC PT "6 Clicks" Mobility  Outcome Measure Help needed turning from your back to your side while in a flat bed without using bedrails?: A Lot Help needed moving from lying on your back to sitting on the side of a flat bed without using bedrails?: A Lot Help needed moving to and from a bed to a chair (including a wheelchair)?: A Lot Help needed standing up from a chair using your arms (e.g., wheelchair or bedside chair)?: A Lot Help needed to walk in hospital room?: Total Help needed climbing 3-5 steps with a railing? : Total 6 Click Score: 10    End of Session         PT Visit Diagnosis: Unsteadiness on feet (R26.81);Other symptoms and signs involving the nervous system (R29.898)    Time: 1610-9604 PT Time Calculation (min) (ACUTE ONLY): 33 min   Charges:   PT Evaluation $PT Eval Moderate Complexity: 1 Mod   PT General Charges $$ ACUTE PT VISIT: 1 Visit         02/16/2023  Jacinto Halim., PT Acute Rehabilitation Services [phone]  (office)  Eliseo Gum Aubrynn Katona 02/16/2023, 2:31 PM

## 2023-02-16 NOTE — Progress Notes (Signed)
  Progress Note   Patient: Miguel Black ZDG:[id number] DOB: 11-14-1990 DOA: 02/13/2023     3 DOS: the patient was seen and examined on 02/16/2023   Brief hospital course: 33yo with hx MS, seizures who presented initially with seizures, requiring intubation for airway protection. Neurology following for seizure med titration. Pt ultimately was extubated and care transferred to [facility]  Assessment and Plan: #Seizure disorder with recurrent seizures -Initially intubated for airway protection, now extubated -Neurology following. Recs to reduce depakote to 250mg  bid, increase topamax to 100mg  bid -Rescue meds include: intranasal valtoco 15mg  for seizure   # Acute metabolic encephalopathy secondary to postictal state Encephalopathy resolved, patient is back to his baseline.  # Leukocytosis,  -Resolved, possibly reactive from seizures activity   # MS with ambulatory dysfunction Pt uses a cane at baseline PT/OT consulted. Recs for acute inpatient rehab noted     Subjective: Feeling better. Hoping to go home sson  Physical Exam: Vitals:   02/16/23 0700 02/16/23 0800 02/16/23 0900 02/16/23 1010  BP: 125/80 119/75 131/83 (!) 143/83  Pulse: 83 74 78   Resp: (!) 21 18 19    Temp:  98.5 F (36.9 C)    TempSrc:  Axillary  Oral  SpO2: 97% 96% 94%   Weight:      Height:       General exam: Awake, laying in bed, in nad Respiratory system: Normal respiratory effort, no wheezing Cardiovascular system: regular rate, s1, s2 Gastrointestinal system: Soft, nondistended, positive BS Central nervous system: CN2-12 grossly intact, strength intact Extremities: Perfused, no clubbing Skin: Normal skin turgor, no notable skin lesions seen Psychiatry: Mood normal // no visual hallucinations   Data Reviewed:  Labs reviewed: na 141, K 3.5, Cr 1.08, WBC 10.4, hgb 14.7, Plts 212  Family Communication:  Pt in room, family not at bedside Disposition: Rehab  Status is: Inpatient Remains inpatient  appropriate because: severity of illness  Planned Discharge Destination: Rehab    Author: Rickey Barbara, MD 02/16/2023 4:25 PM  For on call review [email or web address].

## 2023-02-16 NOTE — Evaluation (Signed)
Occupational Therapy Evaluation Patient Details Name: Miguel Black MRN: [id number] DOB: 04-29-1990 Today's Date: 02/16/2023   History of Present Illness Miguel Black is a 33 y/o male admitted 02/13/23 after breakthrough generalized seizure; on arrival, patient had a left-sided gaze, arms were contracted and had clonus with resistance provided to upper extremities. Intubated 02/13/23-02/15/23. CT and MRI stable and indicative of MS without acute demyelination. Small superficial midline decubitus ulcer over the mid sacrum. PMH includes MS, seizure disorder.   Clinical Impression   Pt is typically mod I for mobility and ADL. Pt has 24/7 assist from mom who works from home. Today Pt is overall mod A for UB ADL and max A for LB ADL, Pt not using BUE together for functional tasks, and really struggled especially with oral care while seated in front of the sink. Pt reports no visual disturbance - but next session plan to assess vision due to missed visual cues as well as funny head turns with mobility. Pt is max A +2 for transfers and mobility with L side more impacted (presenting with increasing tone throughout session) At this time Pt will require skilled OT in the acute setting as well as afterwards at the >3 hour therapy setting to maximize safety and independence in ADL and functional transfers to return to PLOF.       If plan is discharge home, recommend the following: Two people to help with walking and/or transfers;A lot of help with bathing/dressing/bathroom;Assistance with cooking/housework;Assist for transportation;Help with stairs or ramp for entrance;Supervision due to cognitive status    Functional Status Assessment  Patient has had a recent decline in their functional status and demonstrates the ability to make significant improvements in function in a reasonable and predictable amount of time.  Equipment Recommendations  None recommended by OT (Pt has appropriate DME)     Recommendations for Other Services Rehab consult;PT consult;Speech consult     Precautions / Restrictions Precautions Precautions: Fall Precaution Comments: seizures, L foot drop (baseline) Restrictions Weight Bearing Restrictions Per Provider Order: No      Mobility Bed Mobility Overal bed mobility: Needs Assistance Bed Mobility: Supine to Sit, Sit to Supine     Supine to sit: Mod assist, HOB elevated, Used rails, +2 for safety/equipment Sit to supine: +2 for safety/equipment, Max assist   General bed mobility comments: multimodal cues for sequencing, more assist for LLE on/off the bed, assist for trunk elevation - modified helicopter technique to return supine. Pt able to assist with bridging hips for pad straightening    Transfers Overall transfer level: Needs assistance Equipment used: 2 person hand held assist Transfers: Sit to/from Stand, Bed to chair/wheelchair/BSC Sit to Stand: Mod assist, +2 physical assistance, +2 safety/equipment (initially mod, progressed to max)     Step pivot transfers: Max assist, +2 physical assistance, +2 safety/equipment     General transfer comment: multimodal cues and initially mod A +2 progress/fatgiued to max A +2      Balance Overall balance assessment: Needs assistance Sitting-balance support: Bilateral upper extremity supported, Feet supported Sitting balance-Leahy Scale: Poor (CGA) Sitting balance - Comments: varied from CGA to min A for seated balance EOB   Standing balance support: Bilateral upper extremity supported, During functional activity Standing balance-Leahy Scale: Poor Standing balance comment: dependent on BUE from therapists - more assist on the L side needed  ADL either performed or assessed with clinical judgement   ADL Overall ADL's : Needs assistance/impaired Eating/Feeding: NPO   Grooming: Moderate assistance;Sitting;Wash/dry hands;Wash/dry face;Oral care;Cueing for  sequencing Grooming Details (indicate cue type and reason): in chair by sink Upper Body Bathing: Moderate assistance   Lower Body Bathing: Moderate assistance   Upper Body Dressing : Moderate assistance;Sitting Upper Body Dressing Details (indicate cue type and reason): donning gown as robe Lower Body Dressing: Maximal assistance;Sitting/lateral leans Lower Body Dressing Details (indicate cue type and reason): socks Toilet Transfer: Maximal assistance;+2 for physical assistance;+2 for safety/equipment Toilet Transfer Details (indicate cue type and reason): 2 person HHA Toileting- Clothing Manipulation and Hygiene: Maximal assistance       Functional mobility during ADLs: Maximal assistance;+2 for physical assistance;+2 for safety/equipment;Cueing for safety;Cueing for sequencing (2 person HHA) General ADL Comments: decreased use of LUE during functional ADL tasks,     Vision Baseline Vision/History: 0 No visual deficits Ability to See in Adequate Light: 1 Impaired Patient Visual Report: No change from baseline Vision Assessment?: Vision impaired- to be further tested in functional context Additional Comments: Pt reports vision WFL - however he struggled to get toothpaste on toothbrush (maybe arm muscle related) but also turns head during mobility and seems non-focal     Perception Perception: Not tested       Praxis Praxis: Not tested       Pertinent Vitals/Pain Pain Assessment Pain Assessment: No/denies pain     Extremity/Trunk Assessment Upper Extremity Assessment Upper Extremity Assessment: Right hand dominant;LUE deficits/detail LUE Deficits / Details: does not use functionally during session, moves against gravity - and can hold objects but did not perform Bil hand tasks LUE Sensation: decreased proprioception LUE Coordination: decreased fine motor;decreased gross motor   Lower Extremity Assessment Lower Extremity Assessment: Defer to PT evaluation        Communication Communication Communication: No apparent difficulties;Other (comment) Cueing Techniques: Verbal cues;Tactile cues   Cognition Arousal: Alert Behavior During Therapy: WFL for tasks assessed/performed Overall Cognitive Status: No family/caregiver present to determine baseline cognitive functioning                                 General Comments: Pt pleasant and cooperative throughout session, slow to respond and largely answers questions reasonably - but at times answers some questions like "are you left or right handed" with "yes" need to establish baseline with family     General Comments  Pt pleasant and cooperative throughout session, no family present to determine baseline cognition or personality. When OT was in the room getting home set up questions LLE went rigid and started shaking. This lasted maybe 5 seconds.    Exercises     Shoulder Instructions      Home Living Family/patient expects to be discharged to:: Private residence Living Arrangements: Parent;Other relatives Available Help at Discharge: Available 24 hours/day;Family Type of Home: Other(Comment) Home Access: Stairs to enter Entrance Stairs-Number of Steps: 7 Entrance Stairs-Rails: Right;Left;Can reach both Home Layout: Two level;Bed/bath upstairs Alternate Level Stairs-Number of Steps: flight Alternate Level Stairs-Rails: Left Bathroom Shower/Tub: Tub/shower unit;Door   [employer] Toilet: Standard     Home Equipment: Shower seat;Cane - Programmer, applications (2 wheels);Grab bars - tub/shower;Wheelchair - manual;Cane - single point;BSC/3in1   Additional Comments: Mother works from home.      Prior Functioning/Environment Prior Level of Function : Independent/Modified Independent  Mobility Comments: no AD in home, when in community pt uses cane ADLs Comments: ind, does not drive, reports got his driving permit right before this happened        OT Problem List:  Decreased range of motion;Decreased activity tolerance;Impaired balance (sitting and/or standing);Impaired vision/perception;Decreased cognition;Decreased safety awareness;Decreased knowledge of use of DME or AE;Decreased knowledge of precautions;Impaired UE functional use      OT Treatment/Interventions: Self-care/ADL training;Therapeutic exercise;Neuromuscular education;Energy conservation;DME and/or AE instruction;Manual therapy;Therapeutic activities;Patient/family education;Balance training;Visual/perceptual remediation/compensation;Cognitive remediation/compensation    OT Goals(Current goals can be found in the care plan section) Acute Rehab OT Goals Patient Stated Goal: get better OT Goal Formulation: With patient Time For Goal Achievement: 03/02/23 Potential to Achieve Goals: Good ADL Goals Pt Will Perform Grooming: standing;with contact guard assist Pt Will Perform Upper Body Dressing: with set-up;sitting Pt Will Perform Lower Body Dressing: with contact guard assist;with caregiver independent in assisting;sit to/from stand Pt Will Transfer to Toilet: with min assist;ambulating  OT Frequency: Min 1X/week    Co-evaluation PT/OT/SLP Co-Evaluation/Treatment: Yes Reason for Co-Treatment: Complexity of the patient's impairments (multi-system involvement);For patient/therapist safety;Necessary to address cognition/behavior during functional activity;To address functional/ADL transfers PT goals addressed during session: Mobility/safety with mobility;Balance;Strengthening/ROM OT goals addressed during session: ADL's and self-care;Strengthening/ROM      AM-PAC OT "6 Clicks" Daily Activity     Outcome Measure Help from another person eating meals?: Total (NPO) Help from another person taking care of personal grooming?: A Lot Help from another person toileting, which includes using toliet, bedpan, or urinal?: A Lot Help from another person bathing (including washing, rinsing, drying)?: A  Lot Help from another person to put on and taking off regular upper body clothing?: A Lot Help from another person to put on and taking off regular lower body clothing?: A Lot 6 Click Score: 11   End of Session Equipment Utilized During Treatment: Gait belt Nurse Communication: Mobility status;Precautions  Activity Tolerance: Patient tolerated treatment well Patient left: in bed;with call bell/phone within reach;with bed alarm set  OT Visit Diagnosis: Unsteadiness on feet (R26.81);Other abnormalities of gait and mobility (R26.89);Muscle weakness (generalized) (M62.81);Ataxia, unspecified (R27.0);Other symptoms and signs involving the nervous system (R29.898)                Time: 4098-1191 OT Time Calculation (min): 33 min Charges:  OT General Charges $OT Visit: 1 Visit OT Evaluation $OT Eval Moderate Complexity: 1 Mod  Nyoka Cowden OTR/L Acute Rehabilitation Services Office: [phone]  Emelda Fear 02/16/2023, 1:56 PM

## 2023-02-16 NOTE — Hospital Course (Signed)
32yo with hx MS, seizures who presented initially with seizures, requiring intubation for airway protection. Neurology following for seizure med titration. Pt ultimately was extubated and care transferred to [facility]

## 2023-02-16 NOTE — Progress Notes (Signed)
Report given to [employer] [phone]. Patient without complaints. Will transfer via bed.

## 2023-02-17 DIAGNOSIS — G40901 Epilepsy, unspecified, not intractable, with status epilepticus: Secondary | ICD-10-CM | POA: Diagnosis not present

## 2023-02-17 LAB — CBC
HCT: 38.6 % — ABNORMAL LOW (ref 39.0–52.0)
Hemoglobin: 13.2 g/dL (ref 13.0–17.0)
MCH: 32 pg (ref 26.0–34.0)
MCHC: 34.2 g/dL (ref 30.0–36.0)
MCV: 93.5 fL (ref 80.0–100.0)
Platelets: 225 10*3/uL (ref 150–400)
RBC: 4.13 MIL/uL — ABNORMAL LOW (ref 4.22–5.81)
RDW: 13.2 % (ref 11.5–15.5)
WBC: 7.2 10*3/uL (ref 4.0–10.5)
nRBC: 0 % (ref 0.0–0.2)

## 2023-02-17 LAB — COMPREHENSIVE METABOLIC PANEL
ALT: 16 U/L (ref 0–44)
AST: 30 U/L (ref 15–41)
Albumin: 3.4 g/dL — ABNORMAL LOW (ref 3.5–5.0)
Alkaline Phosphatase: 52 U/L (ref 38–126)
Anion gap: 10 (ref 5–15)
BUN: <5 mg/dL — ABNORMAL LOW (ref 6–20)
CO2: 17 mmol/L — ABNORMAL LOW (ref 22–32)
Calcium: 8.9 mg/dL (ref 8.9–10.3)
Chloride: 113 mmol/L — ABNORMAL HIGH (ref 98–111)
Creatinine, Ser: 0.77 mg/dL (ref 0.61–1.24)
GFR, Estimated: >60 mL/min (ref >60)
Glucose, Bld: 121 mg/dL — ABNORMAL HIGH (ref 70–99)
Potassium: 3.7 mmol/L (ref 3.5–5.1)
Sodium: 140 mmol/L (ref 135–145)
Total Bilirubin: 0.6 mg/dL (ref 0.0–1.2)
Total Protein: 6.9 g/dL (ref 6.5–8.1)

## 2023-02-17 LAB — GLUCOSE, CAPILLARY
Glucose-Capillary: 104 mg/dL — ABNORMAL HIGH (ref 70–99)
Glucose-Capillary: 109 mg/dL — ABNORMAL HIGH (ref 70–99)
Glucose-Capillary: 106 mg/dL — ABNORMAL HIGH (ref 70–99)
Glucose-Capillary: 126 mg/dL — ABNORMAL HIGH (ref 70–99)

## 2023-02-17 LAB — PHOSPHORUS: Phosphorus: 3.5 mg/dL (ref 2.5–4.6)

## 2023-02-17 LAB — MAGNESIUM: Magnesium: 1.9 mg/dL (ref 1.7–2.4)

## 2023-02-17 MED ORDER — DIVALPROEX SODIUM 250 MG PO DR TAB: 250.0000 mg | DELAYED_RELEASE_TABLET | Freq: Two times a day (BID) | ORAL | 0 refills | Status: DC

## 2023-02-17 MED ORDER — ACETAMINOPHEN 325 MG PO TABS: 650.0000 mg | ORAL_TABLET | ORAL | Status: DC | PRN

## 2023-02-17 MED ORDER — ADULT MULTIVITAMIN W/MINERALS CH
1.0000 | ORAL_TABLET | Freq: Every day | ORAL | Status: DC
  Administered 2023-02-17: 1 via ORAL
  Filled 2023-02-17: qty 1

## 2023-02-17 MED ORDER — CEFADROXIL 500 MG PO CAPS: 500.0000 mg | ORAL_CAPSULE | Freq: Two times a day (BID) | ORAL | 0 refills | Status: AC

## 2023-02-17 MED ORDER — THIAMINE MONONITRATE 100 MG PO TABS
100.0000 mg | ORAL_TABLET | Freq: Every day | ORAL | Status: DC
  Administered 2023-02-17: 100 mg via ORAL
  Filled 2023-02-17: qty 1

## 2023-02-17 MED ORDER — TOPIRAMATE 100 MG PO TABS: 100.0000 mg | ORAL_TABLET | Freq: Two times a day (BID) | ORAL | 0 refills | Status: DC

## 2023-02-17 MED ORDER — DOCUSATE SODIUM 100 MG PO CAPS: 100.0000 mg | ORAL_CAPSULE | Freq: Two times a day (BID) | ORAL | Status: DC | PRN

## 2023-02-17 MED ORDER — POLYETHYLENE GLYCOL 3350 17 G PO PACK: 17.0000 g | PACK | Freq: Every day | ORAL | Status: DC

## 2023-02-17 NOTE — Progress Notes (Signed)
  Progress Note   Patient: Miguel Black:[id number] DOB: 10/01/1990 DOA: 02/13/2023     4 DOS: the patient was seen and examined on 02/17/2023   Brief hospital course: 33yo with hx MS, seizures who presented initially with seizures, requiring intubation for airway protection. Neurology following for seizure med titration. Pt ultimately was extubated and care transferred to [facility]  Assessment and Plan: #Seizure disorder with recurrent seizures -Initially intubated for airway protection, now extubated -Neurology following. Recs to reduce depakote to 250mg  bid, increased topamax to 100mg  bid -Rescue meds include: intranasal valtoco 15mg  for seizure   # Acute metabolic encephalopathy secondary to postictal state Encephalopathy resolved, patient is back to his baseline.  # Leukocytosis,  -Resolved, possibly reactive from seizures activity   # MS with ambulatory dysfunction Pt uses a cane at baseline PT/OT consulted. Recs for acute inpatient rehab noted, however family is wanting pt to go home instead     Subjective: Eager to go home soon  Physical Exam: Vitals:   02/17/23 0800 02/17/23 1200 02/17/23 1600 02/17/23 1641  BP: (!) 136/91 (!) 139/93  (!) 152/97  Pulse:  87  100  Resp: (!) 22 20 17 20   Temp:  99.5 F (37.5 C)  98 F (36.7 C)  TempSrc:  Oral  Oral  SpO2: 97% 100% 100% 96%  Weight:      Height:       General exam: Conversant, in no acute distress Respiratory system: normal chest rise, clear, no audible wheezing Cardiovascular system: regular rhythm, s1-s2 Gastrointestinal system: Nondistended, nontender, pos BS Central nervous system: No seizures, no tremors Extremities: No cyanosis, no joint deformities Skin: No rashes, no pallor Psychiatry: Affect normal // no auditory hallucinations   Data Reviewed:  Labs reviewed: Na 140, K 3.7, Cr 0.77, WBC 7.2, hgb 13.2, Plts 225  Family Communication:  Pt in room, family not at bedside Disposition: Rehab  Status  is: Inpatient Remains inpatient appropriate because: severity of illness  Planned Discharge Destination: Rehab    Author: Rickey Barbara, MD 02/17/2023 4:45 PM  For on call review [email or web address].

## 2023-02-17 NOTE — Discharge Summary (Signed)
Physician Discharge Summary   Patient: Miguel Black MRN: [id number] DOB: [birth year]  Admit date:     02/13/2023  Discharge date: 02/17/23  Discharge Physician: Rickey Barbara   PCP: Lula Olszewski, MD   Recommendations at discharge:    Follow up with PCP in 1-2 weeks  Discharge Diagnoses: Active Problems:   Status epilepticus (HCC)  Resolved Problems:   * No resolved hospital problems. [facility] Course: 32yo with hx MS, seizures who presented initially with seizures, requiring intubation for airway protection. Neurology following for seizure med titration. Pt ultimately was extubated and care transferred to [facility]  Assessment and Plan: #Seizure disorder with recurrent seizures -Initially intubated for airway protection, now extubated -Neurology following. Recs to reduce depakote to 250mg  bid, increased topamax to 100mg  bid -Rescue meds include: intranasal valtoco 15mg  for seizure   # Acute metabolic encephalopathy secondary to postictal state Encephalopathy resolved, patient is back to his baseline.   # Leukocytosis,  -Resolved, possibly reactive from seizures activity   # MS with ambulatory dysfunction Pt uses a cane at baseline PT/OT consulted. Recs for acute inpatient rehab noted, however family is wanting pt to go home instead with outpatient PT       Consultants: Neurology Procedures performed:   Disposition: Home Diet recommendation:  Regular diet DISCHARGE MEDICATION: Allergies as of 02/17/2023       Reactions   Ampyra [dalfampridine] Other (See Comments)   Seizure 02/2021 while on dalfampridine        Medication List     TAKE these medications    cefadroxil 500 MG capsule Commonly known as: DURICEF Take 1 capsule (500 mg total) by mouth 2 (two) times daily for 3 days.   divalproex 250 MG DR tablet Commonly known as: Depakote Take 1 tablet (250 mg total) by mouth 2 (two) times daily.   Ocrevus 300 MG/10ML injection Generic drug:  ocrelizumab Inject 20 mLs (600 mg total) into the vein every 6 (six) months.   topiramate 100 MG tablet Commonly known as: TOPAMAX Take 1 tablet (100 mg total) by mouth 2 (two) times daily. What changed:  medication strength how much to take   Valtoco 15 MG Dose 7.5 MG/0.1ML Lqpk Generic drug: diazePAM (15 MG Dose) Place 1 Dose into the nose daily as needed.        Follow-up Information     Lula Olszewski, MD Follow up in 2 week(s).   Specialty: Internal Medicine Why: Hospital follow up Contact information: [address] [place] [address] [phone]         Asa Lente, MD. Schedule an appointment as soon as possible for a visit.   Specialty: Neurology Why: Hospital follow up Contact information: [address] [place] [address] [phone]                Discharge Exam: Ceasar Mons Weights   02/15/23 0500 02/16/23 0400 02/17/23 0500  Weight: 63 kg 63 kg 62.8 kg   General exam: Awake, laying in bed, in nad Respiratory system: Normal respiratory effort, no wheezing Cardiovascular system: regular rate, s1, s2 Gastrointestinal system: Soft, nondistended, positive BS Central nervous system: CN2-12 grossly intact, strength intact Extremities: Perfused, no clubbing Skin: Normal skin turgor, no notable skin lesions seen Psychiatry: Mood normal // no visual hallucinations   Condition at discharge: fair  The results of significant diagnostics from this hospitalization (including imaging, microbiology, ancillary and laboratory) are listed below for reference.   Imaging Studies: DG CHEST PORT  1 VIEW Result Date: 02/15/2023 CLINICAL DATA:  Acute respiratory failure. EXAM: PORTABLE CHEST 1 VIEW COMPARISON:  02/13/2023 FINDINGS: Endotracheal tube tip is 4.9 cm above the base of the carina. NG tube is been removed in the interval. Marked interval improvement in aeration at the left base with some minimal residual retrocardiac airspace disease on  the current study. Right lung clear. The cardiopericardial silhouette is within normal limits for size. Telemetry leads overlie the chest. IMPRESSION: 1. Endotracheal tube tip is 4.9 cm above the base of the carina. 2. Marked improvement in retrocardiac opacity seen previously. There is some minimal residual airspace disease in the retrocardiac left base on the current study. Electronically Signed   By: Kennith Center M.D.   On: 02/15/2023 11:12   DG Abd 1 View Result Date: 02/15/2023 CLINICAL DATA:  Vomiting.  Ileus. EXAM: ABDOMEN - 1 VIEW COMPARISON:  02/13/2023 FINDINGS: Moderate gaseous distention of the stomach. Air is seen scattered through mildly distended small bowel without an overtly obstructive pattern. Gas and stool is noted in the left colon and rectum. Lucency over the low pelvis is presumably in the rectum but has a configuration that resembles the orientation of the urinary bladder. IMPRESSION: 1. Moderate gaseous distention of the stomach with mildly distended small bowel and gas and stool visible in the left colon. No findings to suggest bowel obstruction. 2. Lucency over the low pelvis is presumably in the rectum but has a configuration that resembles the orientation of the urinary bladder. Correlation with urinalysis or a history of recent instrumentation suggested. Further workup with pelvis x-rays including a decubitus view may prove helpful. Electronically Signed   By: Kennith Center M.D.   On: 02/15/2023 07:11   MR SACRUM SI JOINTS W WO CONTRAST Result Date: 02/14/2023 CLINICAL DATA:  Sacral decubitus ulcer.  Evaluate for osteomyelitis. EXAM: MRI SACRUM WITHOUT AND WITH CONTRAST TECHNIQUE: Multiplanar and multiecho pulse sequences of the sacrum were obtained without and with intravenous contrast. CONTRAST:  6mL GADAVIST GADOBUTROL 1 MMOL/ML IV SOLN COMPARISON:  None Available. FINDINGS: Bones/Joint/Cartilage No marrow signal abnormality. No fracture or dislocation. Joint spaces are  preserved. No joint effusion. Ligaments Collateral ligaments are intact. Muscles and Tendons Intact.  No muscle edema or atrophy. Soft tissue Small superficial midline decubitus ulcer over the mid sacrum. No fluid collection. No soft tissue mass. Foley catheter and dependent air within the bladder. Trace free fluid in the pelvis. IMPRESSION: 1. Small superficial midline decubitus ulcer over the mid sacrum. No abscess or osteomyelitis. Electronically Signed   By: Obie Dredge M.D.   On: 02/14/2023 18:03   Overnight EEG with video Result Date: 02/14/2023 Charlsie Quest, MD     02/15/2023  9:25 AM Patient Name: Miguel Black MRN: [id number] Epilepsy Attending: Charlsie Quest Referring Physician/Provider: Gordy Councilman, MD Duration: 02/13/2023 1718 to 02/14/2023 1930 Patient history: 33 year old male with history of epilepsy presented with breakthrough seizures.  EEG to evaluate for seizure. Level of alertness:  comatose AEDs during EEG study: Depakote, propofol Technical aspects: This EEG study was done with scalp electrodes positioned according to the 10-20 International system of electrode placement. Electrical activity was reviewed with band pass filter of 1-70Hz , sensitivity of 7 uV/mm, display speed of 60mm/sec with a 60Hz  notched filter applied as appropriate. EEG data were recorded continuously and digitally stored.  Video monitoring was available and reviewed as appropriate. Description: EEG showed continuous generalized 3 to 6 Hz theta-delta slowing with overriding 15 to 18 Hz  beta activity distributed symmetrically and diffusely. Hyperventilation and photic stimulation were not performed.   EEG was disconnected on 02/14/2023 between 1522 to 1835 for imaging ABNORMALITY - Continuous slow, generalized IMPRESSION: This study is suggestive of severe diffuse encephalopathy likely related to sedation. No seizures or epileptiform discharges were seen throughout the recording. Priyanka [name]   CT  HEAD WO CONTRAST ( ) Result Date: 02/14/2023 CLINICAL DATA:  33 year old male with seizure, multiple sclerosis. Anisocoria. EXAM: CT HEAD WITHOUT CONTRAST TECHNIQUE: Contiguous axial images were obtained from the base of the skull through the vertex without intravenous contrast. RADIATION DOSE REDUCTION: This exam was performed according to the departmental dose-optimization program which includes automated exposure control, adjustment of the mA and/or kV according to patient size and/or use of iterative reconstruction technique. COMPARISON:  Brain MRI 11/14/2022.  Head CT yesterday. FINDINGS: Brain: Chronic cerebral white matter disease better demonstrated by MRI and most apparent by CT in the left periatrial region. Stable gray-white matter differentiation throughout the brain. No midline shift, ventriculomegaly, mass effect, evidence of mass lesion, intracranial hemorrhage or evidence of cortically based acute infarction. Vascular: Symmetric. No suspicious intracranial vascular hyperdensity. Skull: Stable and intact. Sinuses/Orbits: Visualized paranasal sinuses and mastoids are stable and well aerated. Other: Patient remains intubated on the scout view with associated fluid level in the pharynx. Disconjugate gaze but otherwise negative CT appearance of the orbits. Scalp EEG electrodes in place. IMPRESSION: 1. No acute intracranial abnormality by CT. Chronic multiple sclerosis. 2. Intubated.  Scalp EEG electrodes in place. Electronically Signed   By: Odessa Fleming M.D.   On: 02/14/2023 05:05   CT Head Wo Contrast Result Date: 02/13/2023 CLINICAL DATA:  Seizure disorder EXAM: CT HEAD WITHOUT CONTRAST TECHNIQUE: Contiguous axial images were obtained from the base of the skull through the vertex without intravenous contrast. RADIATION DOSE REDUCTION: This exam was performed according to the departmental dose-optimization program which includes automated exposure control, adjustment of the mA and/or kV according to  patient size and/or use of iterative reconstruction technique. COMPARISON:  MRI 11/14/2022, CT brain 11/12/2022 FINDINGS: Brain: No acute territorial infarction, hemorrhage or intracranial mass. White matter hypodensities grossly similar, presumably corresponding to history of MS. Stable ventricle size Vascular: No hyperdense vessels.  No unexpected calcification Skull: Normal. Negative for fracture or focal lesion. Sinuses/Orbits: Mucosal thickening in the sinuses. Other: None IMPRESSION: 1. No CT evidence for acute intracranial abnormality. 2. White matter hypodensities grossly similar, presumably corresponding to history of MS. Electronically Signed   By: Jasmine Pang M.D.   On: 02/13/2023 16:36   DG Chest Portable 1 View Result Date: 02/13/2023 CLINICAL DATA:  ET tube and enteric tube placement EXAM: PORTABLE CHEST 1 VIEW and limited abdominal x-ray for tube placement COMPARISON:  Chest x-ray 11/12/2022 and older. FINDINGS: ET tube in place with tip seen proximally 5 cm above the carina. Enteric tube with the tip overlying the midbody of the stomach. Normal cardiopericardial silhouette. No pneumothorax, effusion or edema. There is a left retrocardiac opacity, consolidation. Overlapping cardiac leads. Chest x-ray is slightly rotated to the left. Limited x-ray of the upper abdomen has a gas in nondilated loops of bowel. The stomach is distended with air. IMPRESSION: ET tube and enteric tube in place. Left retrocardiac opacity.  Recommend follow-up Electronically Signed   By: Karen Kays M.D.   On: 02/13/2023 16:14   DG Abd Portable 1 View Result Date: 02/13/2023 CLINICAL DATA:  ET tube and enteric tube placement EXAM: PORTABLE CHEST 1 VIEW and  limited abdominal x-ray for tube placement COMPARISON:  Chest x-ray 11/12/2022 and older. FINDINGS: ET tube in place with tip seen proximally 5 cm above the carina. Enteric tube with the tip overlying the midbody of the stomach. Normal cardiopericardial silhouette.  No pneumothorax, effusion or edema. There is a left retrocardiac opacity, consolidation. Overlapping cardiac leads. Chest x-ray is slightly rotated to the left. Limited x-ray of the upper abdomen has a gas in nondilated loops of bowel. The stomach is distended with air. IMPRESSION: ET tube and enteric tube in place. Left retrocardiac opacity.  Recommend follow-up Electronically Signed   By: Karen Kays M.D.   On: 02/13/2023 16:14    Microbiology: Results for orders placed or performed during the hospital encounter of 02/13/23  Culture, Respiratory w Gram Stain     Status: None   Collection Time: 02/13/23  4:23 PM   Specimen: Tracheal Aspirate; Respiratory  Result Value Ref Range Status   Specimen Description TRACHEAL ASPIRATE  Final   Special Requests NONE  Final   Gram Stain   Final    FEW WBC PRESENT, PREDOMINANTLY PMN FEW GRAM POSITIVE COCCI IN CHAINS RARE GRAM NEGATIVE RODS    Culture   Final    Normal respiratory flora-no Staph aureus or Pseudomonas seen Performed at [facility], [address] [place], [place] [address]    Report Status 02/15/2023 FINAL  Final  MRSA Next Gen by PCR, Nasal     Status: None   Collection Time: 02/13/23  6:12 PM   Specimen: Nasal Mucosa; Nasal Swab  Result Value Ref Range Status   MRSA by PCR Next Gen NOT DETECTED NOT DETECTED Final    Comment: (NOTE) The GeneXpert MRSA Assay (FDA approved for NASAL specimens only), is one component of a comprehensive MRSA colonization surveillance program. It is not intended to diagnose MRSA infection nor to guide or monitor treatment for MRSA infections. Test performance is not FDA approved in patients less than 10 years old. Performed at [facility], [address] [place], [place] [address]   Culture, blood (Routine X 2) w Reflex to ID Panel     Status: None (Preliminary result)   Collection Time: 02/14/23 12:20 PM   Specimen: BLOOD RIGHT HAND  Result Value Ref Range Status   Specimen  Description BLOOD RIGHT HAND  Final   Special Requests   Final    BOTTLES DRAWN AEROBIC ONLY Blood Culture results may not be optimal due to an inadequate volume of blood received in culture bottles   Culture   Final    NO GROWTH 3 DAYS Performed at [facility], [address], [place], [place] [address]    Report Status PENDING  Incomplete  Aerobic/Anaerobic Culture w Gram Stain (surgical/deep wound)     Status: Abnormal (Preliminary result)   Collection Time: 02/14/23 12:45 PM   Specimen: Wound  Result Value Ref Range Status   Specimen Description WOUND  Final   Special Requests SACRAL  Final   Gram Stain   Final    NO WBC SEEN NO ORGANISMS SEEN Performed at [facility], [address], [place], [place] [address]    Culture (A)  Final    MULTIPLE ORGANISMS PRESENT, NONE PREDOMINANT NO ANAEROBES ISOLATED; CULTURE IN PROGRESS FOR 5 DAYS    Report Status PENDING  Incomplete  Culture, blood (Routine X 2) w Reflex to ID Panel     Status: None (Preliminary result)   Collection  Time: 02/14/23  1:27 PM   Specimen: BLOOD RIGHT HAND  Result Value Ref Range Status   Specimen Description BLOOD RIGHT HAND  Final   Special Requests   Final    BOTTLES DRAWN AEROBIC AND ANAEROBIC Blood Culture results may not be optimal due to an inadequate volume of blood received in culture bottles   Culture   Final    NO GROWTH 3 DAYS Performed at [facility], [address] [place], [place] [address]    Report Status PENDING  Incomplete    Labs: CBC: Recent Labs  Lab 02/13/23 1501 02/13/23 1533 02/13/23 2047 02/14/23 0511 02/15/23 0518 02/16/23 0542 02/17/23 0558  WBC 18.9*  --   --  23.5* 17.4* 10.4 7.2  NEUTROABS 16.6*  --   --   --   --  8.5*  --   HGB 15.2   < > 14.6 15.3 13.5 14.7 13.2  HCT 46.1   < > 43.0 46.0 40.1 43.4 38.6*  MCV 97.5  --   --  96.6 95.2 93.9 93.5  PLT 301  --   --  230 212 212 225   < > = values in this interval not displayed.   Basic  Metabolic Panel: Recent Labs  Lab 02/13/23 1831 02/13/23 2047 02/14/23 0511 02/14/23 1327 02/14/23 1657 02/15/23 0518 02/16/23 0542 02/17/23 0558  NA 138 140 141  --   --  139 141 140  K 4.1 3.1* 3.5  --   --  3.5 3.5 3.7  CL 109  --  110  --   --  111 110 113*  CO2 19*  --  17*  --   --  19* 21* 17*  GLUCOSE 111*  --  89  --   --  104* 96 121*  BUN 10  --  8  --   --  12 8 <5*  CREATININE 1.16  --  1.26*  --   --  1.11 1.08 0.77  CALCIUM 8.9  --  8.5*  --   --  8.2* 8.7* 8.9  MG 1.9  --   --  2.6* 2.4 2.3 2.0 1.9  PHOS  --   --   --  3.1 2.9 3.7 2.7 3.5   Liver Function Tests: Recent Labs  Lab 02/13/23 1501 02/17/23 0558  AST 21 30  ALT 14 16  ALKPHOS 53 52  BILITOT 0.5 0.6  PROT 7.0 6.9  ALBUMIN 4.1 3.4*   CBG: Recent Labs  Lab 02/16/23 2307 02/17/23 0306 02/17/23 0741 02/17/23 1129 02/17/23 1616  GLUCAP 100* 104* 109* 106* 126*    Discharge time spent: less than 30 minutes.  Signed: Rickey Barbara, MD [facility] 02/17/2023

## 2023-02-17 NOTE — Progress Notes (Addendum)
Inpatient Rehabilitation Admissions Coordinator   I have left a voicemail for patient's Mom to discuss his rehab options and preferences.  Ottie Glazier, RN, MSN Rehab Admissions Coordinator [phone] 02/17/2023 12:16 PM  I spoke with his Mom by phone. She prefers direct discharge home with Outpatient therapy when medically ready.  I will alert acute team and TOC.We will sign off.  Ottie Glazier, RN, MSN Rehab Admissions Coordinator [phone] 02/17/2023 12:33 PM

## 2023-02-17 NOTE — Plan of Care (Signed)
  Problem: Education: Goal: Knowledge of General Education information will improve Description: Including pain rating scale, medication(s)/side effects and non-pharmacologic comfort measures 02/17/2023 1757 by Darryl Nestle, RN Outcome: Adequate for Discharge 02/17/2023 0915 by Darryl Nestle, RN Outcome: Progressing   Problem: Health Behavior/Discharge Planning: Goal: Ability to manage health-related needs will improve 02/17/2023 1757 by Darryl Nestle, RN Outcome: Adequate for Discharge 02/17/2023 0915 by Darryl Nestle, RN Outcome: Progressing   Problem: Clinical Measurements: Goal: Ability to maintain clinical measurements within normal limits will improve 02/17/2023 1757 by Darryl Nestle, RN Outcome: Adequate for Discharge 02/17/2023 0915 by Darryl Nestle, RN Outcome: Progressing Goal: Will remain free from infection 02/17/2023 1757 by Darryl Nestle, RN Outcome: Adequate for Discharge 02/17/2023 0915 by Darryl Nestle, RN Outcome: Progressing Goal: Diagnostic test results will improve 02/17/2023 1757 by Darryl Nestle, RN Outcome: Adequate for Discharge 02/17/2023 0915 by Darryl Nestle, RN Outcome: Progressing Goal: Respiratory complications will improve 02/17/2023 1757 by Darryl Nestle, RN Outcome: Adequate for Discharge 02/17/2023 0915 by Darryl Nestle, RN Outcome: Progressing Goal: Cardiovascular complication will be avoided 02/17/2023 1757 by Darryl Nestle, RN Outcome: Adequate for Discharge 02/17/2023 0915 by Darryl Nestle, RN Outcome: Progressing   Problem: Activity: Goal: Risk for activity intolerance will decrease 02/17/2023 1757 by Darryl Nestle, RN Outcome: Adequate for Discharge 02/17/2023 0915 by Darryl Nestle, RN Outcome: Progressing   Problem: Nutrition: Goal: Adequate nutrition will be maintained 02/17/2023 1757 by Darryl Nestle, RN Outcome: Adequate for Discharge 02/17/2023 0915 by  Darryl Nestle, RN Outcome: Progressing   Problem: Coping: Goal: Level of anxiety will decrease 02/17/2023 1757 by Darryl Nestle, RN Outcome: Adequate for Discharge 02/17/2023 0915 by Darryl Nestle, RN Outcome: Progressing   Problem: Elimination: Goal: Will not experience complications related to bowel motility 02/17/2023 1757 by Darryl Nestle, RN Outcome: Adequate for Discharge 02/17/2023 0915 by Darryl Nestle, RN Outcome: Progressing Goal: Will not experience complications related to urinary retention 02/17/2023 1757 by Darryl Nestle, RN Outcome: Adequate for Discharge 02/17/2023 0915 by Darryl Nestle, RN Outcome: Progressing   Problem: Pain Managment: Goal: General experience of comfort will improve and/or be controlled 02/17/2023 1757 by Darryl Nestle, RN Outcome: Adequate for Discharge 02/17/2023 0915 by Darryl Nestle, RN Outcome: Progressing   Problem: Safety: Goal: Ability to remain free from injury will improve 02/17/2023 1757 by Darryl Nestle, RN Outcome: Adequate for Discharge 02/17/2023 0915 by Darryl Nestle, RN Outcome: Progressing   Problem: Skin Integrity: Goal: Risk for impaired skin integrity will decrease 02/17/2023 1757 by Darryl Nestle, RN Outcome: Adequate for Discharge 02/17/2023 0915 by Darryl Nestle, RN Outcome: Progressing

## 2023-02-17 NOTE — Progress Notes (Addendum)
Nutrition Follow-up  DOCUMENTATION CODES:   Not applicable  INTERVENTION:   Diet advancement per MD Encourage PO intake  Boost Breeze po TID, each supplement provides 250 kcal and 9 grams of protein Multivatmin with minerals daily  100 mg thiamine daily   NUTRITION DIAGNOSIS:   Inadequate oral intake related to inability to eat (Intubated) as evidenced by NPO status.  -progressed on clear liquid diet   GOAL:   Patient will meet greater than or equal to 90% of their needs  - Ongoing   MONITOR:   Diet advancement, Vent status, TF tolerance  REASON FOR ASSESSMENT:   Consult Enteral/tube feeding initiation and management  ASSESSMENT:   33 y.o. M, presented to ED with reported seizures. Intubated and placed on propfol. Admitted for status epilepticus. Per ED provider on arrival patient had left-sided gaze arms were contracted and had clonus with resistance provided to upper extremities.  PMH: seizure, MS, Status epilepticus, foot drop, acute encephalopathy.  1/20 - Intubated  1/21 - TF started via OG  1/22 - Extubated, OGT removed,TF stopped, Clear liquids 1/23 Transferred to 4NP   Patient sitting in chair, no family bedside. Had 100% of his Boost Breeze this morning Kept repeating "he wanted to go home." Patient was on tube feeds since 1/21-1/22 but never reached goal rate when they were D/C. Now on Clear liquid diet. Being provided Boost Breeze TID. CIR following.   Admit weight: 62.7 kg  Current weight: 62.8 kg    Average Meal Intake: 1/23-1/24: 87% intake x 2 recorded meals  Nutritionally Relevant Medications: Scheduled Meds:  Chlorhexidine Gluconate Cloth  6 each Topical Daily   docusate sodium  100 mg Oral BID   feeding supplement  1 Container Oral TID BM   heparin  5,000 Units Subcutaneous Q8H   [START ON 02/18/2023] polyethylene glycol  17 g Oral Daily   topiramate  100 mg Oral BID   Continuous Infusions:  cefTRIAXone (ROCEPHIN)  IV 2 g (02/17/23 1406)    valproate sodium 250 mg (02/17/23 1027)    Labs Reviewed: Chloride 113, BUN <5,  CBG ranges from 80-118 mg/dL over the last 24 hours  NUTRITION - FOCUSED PHYSICAL EXAM:  Flowsheet Row Most Recent Value  Orbital Region No depletion  Upper Arm Region Mild depletion  Thoracic and Lumbar Region Moderate depletion  Buccal Region No depletion  Temple Region No depletion  Clavicle Bone Region Mild depletion  Clavicle and Acromion Bone Region No depletion  Scapular Bone Region Mild depletion  Dorsal Hand Mild depletion  Patellar Region Mild depletion  Anterior Thigh Region Mild depletion  Posterior Calf Region Mild depletion  Edema (RD Assessment) None  Hair Reviewed  Eyes Reviewed  Mouth Reviewed  Skin Reviewed  Nails Reviewed       Diet Order:   Diet Order             Diet clear liquid Room service appropriate? Yes; Fluid consistency: Thin  Diet effective now                   EDUCATION NEEDS:      Skin:  Skin Assessment: Skin Integrity Issues: Skin Integrity Issues:: Other (Comment) Other: Slin tear gluteal fold  Last BM:  PTA  Height:   Ht Readings from Last 1 Encounters:  02/13/23 5\' 11"  (1.803 m)    Weight:   Wt Readings from Last 1 Encounters:  02/17/23 62.8 kg    Ideal Body Weight:  70.4 kg  BMI:  Body mass index is 19.31 kg/m.  Estimated Nutritional Needs:   Kcal:  1885-2200 kcal  Protein:  85-100g  Fluid:  33ml/kcal  Elliot Dally, RD Registered Dietitian  See Amion for more information

## 2023-02-19 LAB — AEROBIC/ANAEROBIC CULTURE W GRAM STAIN (SURGICAL/DEEP WOUND): Gram Stain: NONE SEEN

## 2023-02-19 LAB — CULTURE, BLOOD (ROUTINE X 2)
Culture: NO GROWTH
Culture: NO GROWTH

## 2023-02-20 ENCOUNTER — Telehealth: Payer: Self-pay

## 2023-02-20 NOTE — Transitions of Care (Post Inpatient/ED Visit) (Unsigned)
   02/20/2023  Name: ROBERT SPERL MRN: [id number] DOB: 20-Jul-1990  Today's TOC FU Call Status: Today's TOC FU Call Status:: Unsuccessful Call (1st Attempt) Unsuccessful Call (1st Attempt) Date: 02/20/23  Attempted to reach the patient regarding the most recent Inpatient/ED visit.  Follow Up Plan: Additional outreach attempts will be made to reach the patient to complete the Transitions of Care (Post Inpatient/ED visit) call.   Signature Karena Addison, LPN [facility] Nurse Health Advisor Direct Dial [phone]

## 2023-02-21 ENCOUNTER — Telehealth: Payer: Self-pay | Admitting: Neurology

## 2023-02-21 NOTE — Transitions of Care (Post Inpatient/ED Visit) (Signed)
   02/21/2023  Name: Miguel Black MRN: [id number] DOB: 17-Aug-1990  Today's TOC FU Call Status: Today's TOC FU Call Status:: Unsuccessful Call (2nd Attempt) Unsuccessful Call (1st Attempt) Date: 02/20/23 Unsuccessful Call (2nd Attempt) Date: 02/21/23  Attempted to reach the patient regarding the most recent Inpatient/ED visit.  Follow Up Plan: Additional outreach attempts will be made to reach the patient to complete the Transitions of Care (Post Inpatient/ED visit) call.   Signature Karena Addison, LPN [facility] Nurse Health Advisor Direct Dial [phone]

## 2023-02-21 NOTE — Telephone Encounter (Signed)
Patient was recently hospitalized due to having a seizure and we received a new referral for him. He's already scheduled to follow up with you on 3/10, but is this timeframe ok or should he be seen sooner? Thank you.

## 2023-02-21 NOTE — Transitions of Care (Post Inpatient/ED Visit) (Signed)
   02/21/2023  Name: Miguel Black MRN: [id number] DOB: [birth year]  Today's TOC FU Call Status: Today's TOC FU Call Status:: Unsuccessful Call (3rd Attempt) Unsuccessful Call (1st Attempt) Date: 02/20/23 Unsuccessful Call (2nd Attempt) Date: 02/21/23 Unsuccessful Call (3rd Attempt) Date: 02/21/23  Attempted to reach the patient regarding the most recent Inpatient/ED visit.  Follow Up Plan: No further outreach attempts will be made at this time. We have been unable to contact the patient.  Signature Karena Addison, LPN [facility] Nurse Health Advisor Direct Dial [phone]

## 2023-02-27 ENCOUNTER — Ambulatory Visit: Payer: Medicare Other

## 2023-02-27 DIAGNOSIS — R2681 Unsteadiness on feet: Secondary | ICD-10-CM

## 2023-02-27 DIAGNOSIS — R2689 Other abnormalities of gait and mobility: Secondary | ICD-10-CM | POA: Insufficient documentation

## 2023-02-27 DIAGNOSIS — M6281 Muscle weakness (generalized): Secondary | ICD-10-CM

## 2023-02-27 DIAGNOSIS — R569 Unspecified convulsions: Secondary | ICD-10-CM | POA: Insufficient documentation

## 2023-02-27 NOTE — Therapy (Signed)
[facility] [address] [place], [place], [address] Phone: [phone]   Fax:  [phone]  Patient Details  Name: Miguel Black MRN: [id number] Date of Birth: 1990-06-06 Referring Provider:  Jerald Kief, MD  Encounter Date: 02/27/2023  Initiated Initial Evaluation with patient and during course of subjective patient informs this therapist that he prefers not to work with me at this time, reporting: "I dropped you, it should state in the medical record that I don't want to work with you."  When I asked for patient to elaborate he cites past PT sessions with this therapist where during balance activities he lost his balance.  This therapist educated patient that often during more challenging activities this is the expected outcome given his condition and challenge of therapeutic activities to progress mobility and that safety is the main priority as I reiterated safe handling and guarding techniques and no actual fall as ever occurred in our sessions.  Patient again requests discontinuing of evaluation as he does not wish to proceed with me at this time.  Front office staff offered to schedule patient on other therapist schedule at later date for evaluation, patient reports he will call back at a later time for scheduling.  Dion Body, PT 02/27/2023, 2:21 PM  [facility] [address], [address] [place], [place], [address] Phone: [phone]   Fax:  [phone]

## 2023-02-27 NOTE — Therapy (Deleted)
OUTPATIENT PHYSICAL THERAPY NEURO EVALUATION   Patient Name: Miguel Black MRN: [id number] DOB:[birth year], 33 y.o., male Today's Date: 02/27/2023   PCP: Marland Kitchen REFERRING PROVIDER: ***  END OF SESSION:  PT End of Session - 02/27/23 1402     Visit Number 1    Authorization Type Medicare    PT Start Time 1400    PT Stop Time 1445    PT Time Calculation (min) 45 min             Past Medical History:  Diagnosis Date   Acute encephalopathy 04/26/2022   Acute metabolic encephalopathy 04/26/2022   Acute respiratory failure with hypoxia (HCC) 08/23/2021   Aspiration pneumonia (HCC) 07/12/2018   Closed fracture of distal phalanx or phalanges of hand 02/05/2009   Formatting of this note might be different from the original. Overview: The original diagnosis was replaced automatically by the ICD-10 team to an ICD-10 compliant one. Please review for accuracy   Encounter for screening for HIV 07/12/2018   Foot drop 06/03/2022   Has a left foot drop ever since 2015 first flare of multiple sclerosis had a brace in 2022 custom but does not actually do anything to lift the from the foot physical therapist advised him to get an alternative foot drop brace but I cannot find record of that   Marijuana abuse 05/01/2013   MS (multiple sclerosis) (HCC)    Seizure (HCC)    Seizures (HCC) 01/22/2019   Status epilepticus (HCC) 07/12/2018   Past Surgical History:  Procedure Laterality Date   NO PAST SURGERIES     Patient Active Problem List   Diagnosis Date Noted   Seizures (HCC) 11/12/2022   Breakthrough seizure (HCC) 10/07/2022   Leukocytosis 10/07/2022   Hypocalcemia 10/07/2022   Elevated AST (SGOT) 10/07/2022   Status epilepticus (HCC) 06/13/2022   Ambulates with cane 06/04/2022   Ataxia 06/04/2022   Foot drop 06/03/2022   Vitamin D deficiency 06/03/2022   Smokes cigars 06/03/2022   Neurological deficit present 04/26/2022   History of optic neuritis 07/29/2020   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 {OPRCPAIN:27236}  PRECAUTIONS: {Therapy precautions:24002}  RED FLAGS: {PT Red Flags:29287}   WEIGHT BEARING RESTRICTIONS: {Yes ***/No:24003}  FALLS: Has patient fallen in last 6 months? {fallsyesno:27318}  LIVING ENVIRONMENT: Lives with: {OPRC lives with:25569::"lives with their family"} Lives in: {Lives in:25570} Stairs: {opstairs:27293} Has following equipment at home: {Assistive devices:23999}  PLOF: {PLOF:24004}  PATIENT GOALS: ***  OBJECTIVE:  Note: Objective measures were completed at Evaluation unless otherwise noted.  DIAGNOSTIC FINDINGS: ***  COGNITION: Overall cognitive status: {cognition:24006}   SENSATION: {sensation:27233}  COORDINATION: ***  EDEMA:  {edema:24020}  MUSCLE TONE: {LE tone:25568}  MUSCLE LENGTH: Hamstrings: Right *** deg; Left *** deg Maisie Fus test: Right *** deg; Left *** deg  DTRs:  {DTR SITE:24025}  POSTURE: {posture:25561}  LOWER EXTREMITY ROM:     {AROM/PROM:27142}  Right Eval Left Eval  Hip flexion    Hip extension    Hip abduction    Hip adduction    Hip internal rotation    Hip external rotation    Knee flexion    Knee  extension    Ankle dorsiflexion    Ankle plantarflexion    Ankle inversion    Ankle eversion     (Blank rows = not tested)  LOWER EXTREMITY MMT:    MMT Right Eval Left Eval  Hip flexion    Hip extension    Hip abduction    Hip adduction    Hip internal rotation    Hip external rotation    Knee flexion    Knee extension    Ankle dorsiflexion    Ankle plantarflexion    Ankle inversion    Ankle eversion    (Blank rows = not tested)  BED MOBILITY:  {Bed mobility:24027}  TRANSFERS: Assistive device utilized: {Assistive devices:23999}  Sit to stand: {Levels of assistance:24026} Stand to sit: {Levels of assistance:24026} Chair to chair: {Levels of assistance:24026} Floor: {Levels of assistance:24026}  RAMP:  Level of Assistance: {Levels of assistance:24026} Assistive device utilized: {Assistive devices:23999} Ramp Comments: ***  CURB:  Level of Assistance: {Levels of assistance:24026} Assistive device utilized: {Assistive devices:23999} Curb Comments: ***  STAIRS: Level of Assistance: {Levels of assistance:24026} Stair Negotiation Technique: {Stair Technique:27161} with {Rail Assistance:27162} Number of Stairs: ***  Height of Stairs: ***  Comments: ***  GAIT: Gait pattern: {gait characteristics:25376} Distance walked: *** Assistive device utilized: {Assistive devices:23999} Level of assistance: {Levels of assistance:24026} Comments: ***  FUNCTIONAL TESTS:  {Functional tests:24029}  PATIENT SURVEYS:  {rehab surveys:24030}                                                                                                                              TREATMENT DATE: ***    PATIENT EDUCATION: Education details: *** Person educated: {Person educated:25204} Education method: {Education Method:25205} Education comprehension: {Education Comprehension:25206}  HOME EXERCISE PROGRAM: ***  GOALS: Goals reviewed with patient? {yes/no:20286}  SHORT  TERM GOALS:  Target date: ***  *** Baseline: Goal status: INITIAL  2.  *** Baseline:  Goal status: INITIAL  3.  *** Baseline:  Goal status: INITIAL  4.  *** Baseline:  Goal status: INITIAL  5.  *** Baseline:  Goal status: INITIAL  6.  *** Baseline:  Goal status: INITIAL  LONG TERM GOALS: Target date: ***  *** Baseline:  Goal status: INITIAL  2.  *** Baseline:  Goal status: INITIAL  3.  *** Baseline:  Goal status: INITIAL  4.  *** Baseline:  Goal status: INITIAL  5.  *** Baseline:  Goal status: INITIAL  6.  *** Baseline:  Goal status: INITIAL  ASSESSMENT:  CLINICAL IMPRESSION: Patient is a *** y.o. *** who was seen today for physical therapy evaluation and treatment for ***.   OBJECTIVE IMPAIRMENTS: {opptimpairments:25111}.   ACTIVITY LIMITATIONS: {activitylimitations:27494}  PARTICIPATION LIMITATIONS: {participationrestrictions:25113}  PERSONAL FACTORS: {Personal factors:25162} are also affecting patient's functional outcome.   REHAB POTENTIAL: {rehabpotential:25112}  CLINICAL DECISION MAKING: {clinical decision making:25114}  EVALUATION COMPLEXITY: {Evaluation complexity:25115}  PLAN:  PT FREQUENCY: {rehab frequency:25116}  PT DURATION: {rehab duration:25117}  PLANNED INTERVENTIONS: {rehab planned interventions:25118::"97110-Therapeutic exercises","97530- Therapeutic [phone]- Neuromuscular re-education","97535- Self HQIO","96295- Manual therapy"}  PLAN FOR NEXT SESSION: ***   Dion Body, PT 02/27/2023, 2:03 PM

## 2023-03-07 ENCOUNTER — Ambulatory Visit: Payer: Medicare Other | Attending: Internal Medicine | Admitting: Physical Therapy

## 2023-03-07 ENCOUNTER — Encounter: Payer: Self-pay | Admitting: Physical Therapy

## 2023-03-07 ENCOUNTER — Other Ambulatory Visit: Payer: Self-pay

## 2023-03-07 DIAGNOSIS — R2689 Other abnormalities of gait and mobility: Secondary | ICD-10-CM

## 2023-03-07 DIAGNOSIS — R569 Unspecified convulsions: Secondary | ICD-10-CM | POA: Diagnosis not present

## 2023-03-07 DIAGNOSIS — M6281 Muscle weakness (generalized): Secondary | ICD-10-CM | POA: Diagnosis present

## 2023-03-07 DIAGNOSIS — R2681 Unsteadiness on feet: Secondary | ICD-10-CM

## 2023-03-07 NOTE — Therapy (Signed)
OUTPATIENT PHYSICAL THERAPY NEURO EVALUATION   Patient Name: Miguel Black MRN: [id number] DOB:06-May-1990, 33 y.o., male Today's Date: 03/07/2023   PCP: Glenetta Hew, MD REFERRING PROVIDER: Scheryl Marten. Rhona Leavens, MD>Dr. Epimenio Foot (f/u in March 2025)  END OF SESSION:  PT End of Session - 03/07/23 1410     Visit Number 1    Number of Visits 12    Date for PT Re-Evaluation 05/05/23    Authorization Type Medicare/Medicaid of [place]    PT Start Time 1245   Pt arrives late   PT Stop Time 1335    PT Time Calculation (min) 50 min    Equipment Utilized During Treatment Gait belt    Activity Tolerance Patient tolerated treatment well    Behavior During Therapy [facility] for tasks assessed/performed   Pt does have to be redirected to task at hand multiple times during therapy, to redirect to therapy-related conversations            Past Medical History:  Diagnosis Date   Acute encephalopathy 04/26/2022   Acute metabolic encephalopathy 04/26/2022   Acute respiratory failure with hypoxia (HCC) 08/23/2021   Aspiration pneumonia (HCC) 07/12/2018   Closed fracture of distal phalanx or phalanges of hand 02/05/2009   Formatting of this note might be different from the original. Overview: The original diagnosis was replaced automatically by the ICD-10 team to an ICD-10 compliant one. Please review for accuracy   Encounter for screening for HIV 07/12/2018   Foot drop 06/03/2022   Has a left foot drop ever since 2015 first flare of multiple sclerosis had a brace in 2022 custom but does not actually do anything to lift the from the foot physical therapist advised him to get an alternative foot drop brace but I cannot find record of that   Marijuana abuse 05/01/2013   MS (multiple sclerosis) (HCC)    Seizure (HCC)    Seizures (HCC) 01/22/2019   Status epilepticus (HCC) 07/12/2018   Past Surgical History:  Procedure Laterality Date   NO PAST SURGERIES     Patient Active Problem List   Diagnosis Date  Noted   Seizures (HCC) 11/12/2022   Breakthrough seizure (HCC) 10/07/2022   Leukocytosis 10/07/2022   Hypocalcemia 10/07/2022   Elevated AST (SGOT) 10/07/2022   Status epilepticus (HCC) 06/13/2022   Ambulates with cane 06/04/2022   Ataxia 06/04/2022   Foot drop 06/03/2022   Vitamin D deficiency 06/03/2022   Smokes cigars 06/03/2022   Neurological deficit present 04/26/2022   History of optic neuritis 07/29/2020   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   High risk medication use 01/28/2020   Seizure disorder (HCC) 12/04/2019   Marijuana use 12/04/2019   Gait disturbance 01/22/2019   Left-sided weakness 01/22/2019   Other fatigue 01/22/2019   multiple sclerosis 07/12/2018   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  It seems to be helping and I feel like I'm improving.  Pt accompanied by: self  PERTINENT HISTORY: See above; MS, hx of seizures  PAIN:  Are you having pain? No  PRECAUTIONS: Fall and Other: wears AFO LLE  RED FLAGS: None   WEIGHT BEARING RESTRICTIONS: No  FALLS: Has patient fallen in last 6 months? No  LIVING ENVIRONMENT: Lives with: lives with their family Lives in: House/apartment Stairs: Inside and outside steps with rails Has following equipment at home: Single point cane  PLOF: Independent, Independent with household mobility with device, and Independent with community mobility with  device  PATIENT GOALS: To work on balance and strength on L side  OBJECTIVE:  Note: Objective measures were completed at Evaluation unless otherwise noted.  DIAGNOSTIC FINDINGS: MRI 02/14/2023:  IMPRESSION: 1. No acute intracranial abnormality by CT. Chronic multiple sclerosis.  COGNITION: Overall cognitive status:  noted in chart-encephalopathy and ARF with hypoxia; hx of depression and suicidal ideation; needs to be re-directed in PT eval multiple times, due to conversation not appropriate to therapy in nature     LOWER EXTREMITY MMT:    MMT Right Eval Left Eval  Hip flexion 5 4  Hip extension    Hip abduction 4 4  Hip adduction 4 4  Hip internal rotation    Hip external rotation    Knee flexion 4+ 4  Knee extension 4+ 4  Ankle dorsiflexion    Ankle plantarflexion    Ankle inversion    Ankle eversion    (Blank rows = not tested)  TRANSFERS: Assistive device utilized: None  Sit to stand: Modified independence Stand to sit: Modified independence and pt tends to leave LLE far in front and does not fully turn to place LLE under trunk when preparing to sit from gait activities.  GAIT: Gait pattern: step through pattern, decreased stance time- Left, genu recurvatum- Left, ataxic, and wide BOS Distance walked: 50 ft Assistive device utilized: Single point cane and with small rubber quad tip base Level of assistance: SBA Comments: unsteady, extra time with gait  FUNCTIONAL TESTS:  5 times sit to stand: 27.66 sec  Timed up and go (TUG): 27.53 sec 10 meter walk test: 30.72 sec = 1.06 ft/sec Berg Balance Scale: 34/56   [facility] PT Assessment - 03/07/23 1316       Standardized Balance Assessment   Standardized Balance Assessment Berg Balance Test      Berg Balance Test   Sit to Stand Able to stand without using hands and stabilize independently    Standing Unsupported Able to stand 2 minutes with supervision    Sitting with Back Unsupported but Feet Supported on Floor  or Stool Able to sit safely and securely 2 minutes    Stand to Sit Sits independently, has uncontrolled descent    Transfers Able to transfer safely, definite need of hands    Standing Unsupported with Eyes Closed Able to stand 10 seconds with supervision    Standing Unsupported with Feet Together Able to place feet together independently and stand for 1 minute with supervision    From Standing, Reach Forward with Outstretched Arm Can reach forward >12 cm safely (5")    From Standing Position, Pick up Object from Floor Able to pick up shoe, needs supervision    From Standing Position, Turn to Look Behind Over each Shoulder Looks behind one side only/other side shows less weight  shift    Turn 360 Degrees Needs close supervision or verbal cueing   11.38   Standing Unsupported, Alternately Place Feet on Step/Stool Needs assistance to keep from falling or unable to try    Standing Unsupported, One Foot in Front Able to take small step independently and hold 30 seconds    Standing on One Leg Tries to lift leg/unable to hold 3 seconds but remains standing independently    Total Score 34    Berg comment: Scores <45/56 indicate increased fall risk.                                                                                                                                          TREATMENT DATE: 03/07/2023    PATIENT EDUCATION: Education details: Eval results, POC Person educated: Patient Education method: Explanation Education comprehension: verbalized understanding  HOME EXERCISE PROGRAM: Not yet initiated  GOALS: Goals reviewed with patient? Yes  SHORT TERM GOALS: Target date: 04/07/2023  Pt will be independent with HEP for improved balance, strength, gait. Baseline: Goal status: INITIAL  2.  Pt will improve 5x sit<>stand to less than or equal to 20 sec to demonstrate improved functional strength and transfer efficiency. Baseline: 27.66 sec Goal status: INITIAL   LONG  TERM GOALS: Target date: 05/05/2023  Pt will be independent with HEP for improved balance, strength, gait. Baseline:  Goal status: INITIAL  2.  Pt will improve Berg score to at least 40/56 to decrease fall risk. Baseline: 34/56 Goal status: INITIAL  3.  Pt will improve gait velocity to at least 1.5 ft/sec for improved gait efficiency and safety. Baseline: 1.05 ft/sec Goal status: INITIAL  4.  Pt will verbalize plans for continued community fitness upon d/c from PT. Baseline:  Goal status: INITIAL   ASSESSMENT:  CLINICAL IMPRESSION: Patient is a 33 y.o. male who was seen today for physical therapy evaluation and treatment for seizures.  He has a hx of MS an multiple seizures; he was hospitalized in January for seizures.  He is known to this clinic from previous bouts of therapy in the past, and he reports he often notes decline in balance and mobility after seizures occur.  He presents today with decreased balance, decreased functional strength, decreased LLE strength, decreased independence and safety with gait; he is at increased fall risk per Berg, FTSTS, gt velocity, and TUG scores.  He currently reports doing exercises at home, but cannot verbalize specifically what they are, other than "air boxing" exercises.  He will benefit from skilled PT to address the above stated deficits to decrease fall risk and improve overall functional mobility and safety with gait.   OBJECTIVE IMPAIRMENTS: Abnormal gait, decreased balance, decreased mobility, difficulty walking, and decreased strength.   ACTIVITY LIMITATIONS: standing, squatting, transfers, and locomotion level  PARTICIPATION LIMITATIONS: driving, community activity, and community fitness  PERSONAL FACTORS: 3+ comorbidities: see PMH  are also affecting patient's functional outcome.   REHAB POTENTIAL: Good  CLINICAL DECISION MAKING: Evolving/moderate complexity  EVALUATION COMPLEXITY: Moderate  PLAN:  PT FREQUENCY:  1-2x/week  PT DURATION: 8 weeks  PLANNED INTERVENTIONS: 97110-Therapeutic exercises, 97530- Therapeutic activity, 97112- Neuromuscular re-education, 97535- Self Care, 91478- Manual therapy, [phone]- Gait training, Patient/Family education, Balance training, and DME instructions  PLAN FOR NEXT SESSION: Initiate HEP:  proper technique of sit to stand, safe minisquats/chair touch to promote LLE weightbearing, try step taps, standing head motions/EO/EC; lower extremity strength   Alara Daniel W., PT 03/07/2023, 2:15 PM  [facility] at [facility] [address], [address] [place], [place] [address] Phone # [phone] Fax # [phone]

## 2023-03-09 ENCOUNTER — Encounter: Payer: Self-pay | Admitting: Internal Medicine

## 2023-03-09 ENCOUNTER — Ambulatory Visit: Payer: Medicare Other | Admitting: Internal Medicine

## 2023-03-09 VITALS — BP 116/77 | HR 75 | Temp 97.9°F | Ht 71.0 in | Wt 141.8 lb

## 2023-03-09 DIAGNOSIS — R569 Unspecified convulsions: Secondary | ICD-10-CM | POA: Diagnosis not present

## 2023-03-09 DIAGNOSIS — R270 Ataxia, unspecified: Secondary | ICD-10-CM | POA: Diagnosis not present

## 2023-03-09 DIAGNOSIS — G40909 Epilepsy, unspecified, not intractable, without status epilepticus: Secondary | ICD-10-CM

## 2023-03-09 DIAGNOSIS — G40919 Epilepsy, unspecified, intractable, without status epilepticus: Secondary | ICD-10-CM

## 2023-03-09 DIAGNOSIS — G35 Multiple sclerosis: Secondary | ICD-10-CM

## 2023-03-09 MED ORDER — VALTOCO 15 MG DOSE 7.5 MG/0.1ML NA LQPK: 1.0000 | Freq: Every day | NASAL | 2 refills | Status: AC | PRN

## 2023-03-09 NOTE — Assessment & Plan Note (Signed)
Infection Management A recent infection with fever and pus from a sore on the back was treated with cefadroxil, with no current signs of infection. Prompt treatment of future infections is vital to prevent seizure triggers. Monitor for any signs of infection and note that the cefadroxil antibiotic course is completed.

## 2023-03-09 NOTE — Progress Notes (Signed)
 ==============================  [facility]: [phone]   -- Medical Office Visit --  Patient: Miguel Black      Age: 33 y.o.       Sex:  male  Date:   03/09/2023 Today's Healthcare Provider: Lula Olszewski, MD  ==============================   CHIEF COMPLAINT: Hospitalization Follow-up  SUBJECTIVE: Background This is a 33 y.o. male who has multiple sclerosis; Gait disturbance; Left-sided weakness; Other fatigue; Seizure disorder (HCC); Marijuana use; High risk medication use; History of optic neuritis; Internuclear ophthalmoplegia of right eye; Neurological deficit present; Hypermetropia of left eye; Foot drop; Vitamin D deficiency; Smokes cigars; Ambulates with cane; Ataxia; Status epilepticus (HCC); Breakthrough seizure (HCC); Leukocytosis; Hypocalcemia; Elevated AST (SGOT); and Seizures (HCC) on their problem list. Reviewed chart records that patient  has a past medical history of Acute encephalopathy (04/26/2022), Acute metabolic encephalopathy (04/26/2022), Acute respiratory failure with hypoxia (HCC) (08/23/2021), Aspiration pneumonia (HCC) (07/12/2018), Closed fracture of distal phalanx or phalanges of hand (02/05/2009), Encounter for screening for HIV (07/12/2018), Foot drop (06/03/2022), Marijuana abuse (05/01/2013), MS (multiple sclerosis) (HCC), Seizure (HCC), Seizures (HCC) (01/22/2019), and Status epilepticus (HCC) (07/12/2018).  History of Present Illness Miguel Black is a 33 year old male with a seizure disorder who presents with ongoing seizure activity despite multiple medications.  This is a hospital follow up . He is seeking alternative neurologist.  He experiences frequent minor seizures despite being on multiple medications, including Topiramate and Depakote, with a recent increase in Valtoco to 15 mg. He was also administered valproic acid during a recent hospitalization. Prior to this, he was on a lower dose of  Topiramate and not taking Depakote, which may have led to a misunderstanding about his medication regimen. He uses cannabis in addition to his prescribed medications.  He can sense when seizures are imminent and finds that sleep helps prevent them. His dog can detect impending seizures, which has been beneficial. He recently acquired an Apple Watch to monitor his seizures, although he needs to switch to an Apple phone for compatibility.  He recently had a fever and an infection from a sore on his back, treated with cefadroxil, and he is feeling better now. He believes the fever may have triggered his last seizure.  Discussed Past Medical History - Seizure Problem list overviews that were updated at today's visit:No problems updated.  Today's Verbally Confirmed Medications - Two or three seizure medications - Cannabis - Valtoco (nasal) - Topiramate - Depakote - Ocrevus infusion Current Outpatient Medications on File Prior to Visit  Medication Sig   divalproex (DEPAKOTE) 250 MG DR tablet Take 1 tablet (250 mg total) by mouth 2 (two) times daily.   ocrelizumab (OCREVUS) 300 MG/10ML injection Inject 20 mLs (600 mg total) into the vein every 6 (six) months.   topiramate (TOPAMAX) 100 MG tablet Take 1 tablet (100 mg total) by mouth 2 (two) times daily.   No current facility-administered medications on file prior to visit.   Medications Discontinued During This Encounter  Medication Reason   diazePAM, 15 MG Dose, (VALTOCO 15 MG DOSE) 2 x 7.5 MG/0.1ML LQPK Reorder      Objective   Physical Exam     03/09/2023    1:01 PM 02/17/2023    4:41 PM 02/17/2023   12:00 PM  Vitals with BMI  Height 5\' 11"     Weight 141 lbs 13 oz    BMI 19.79    Systolic 116 152 440  Diastolic 77 97  93  Pulse 75 100 87   Wt Readings from Last 10 Encounters:  03/09/23 141 lb 12.8 oz (64.3 kg)  02/17/23 138 lb 7.2 oz (62.8 kg)  11/24/22 163 lb 6.4 oz (74.1 kg)  11/12/22 136 lb 11 oz (62 kg)  10/06/22 143 lb  4.8 oz (65 kg)  08/08/22 142 lb (64.4 kg)  06/16/22 154 lb 5.2 oz (70 kg)  06/03/22 149 lb 12.8 oz (67.9 kg)  01/05/22 161 lb 8 oz (73.3 kg)  08/27/21 150 lb 5.7 oz (68.2 kg)   Vital signs reviewed.  Nursing notes reviewed. Weight trend reviewed. Abnormalities and Problem-Specific physical exam findings:  walks unstable with cane agreeable to physical therapy   General Appearance:  No acute distress appreciable.   Well-groomed, healthy-appearing male.  Well proportioned with no abnormal fat distribution.  Good muscle tone. Pulmonary:  Normal work of breathing at rest, no respiratory distress apparent. SpO2: 99 %  Musculoskeletal: All extremities are intact.  Neurological:  Awake, alert, oriented, and engaged.  No obvious focal neurological deficits or cognitive impairments.  Sensorium seems unclouded.   Speech is clear and coherent with logical content. Psychiatric:  Appropriate mood, pleasant and cooperative demeanor, thoughtful and engaged during the exam    No results found for any visits on 03/09/23. No results displayed because visit has over 200 results.    Admission on 11/12/2022, Discharged on 11/15/2022  Component Date Value   WBC 11/12/2022 13.7 (H)    RBC 11/12/2022 4.89    Hemoglobin 11/12/2022 15.3    HCT 11/12/2022 47.6    MCV 11/12/2022 97.3    MCH 11/12/2022 31.3    MCHC 11/12/2022 32.1    RDW 11/12/2022 13.2    Platelets 11/12/2022 256    nRBC 11/12/2022 0.0    Neutrophils Relative % 11/12/2022 84    Neutro Abs 11/12/2022 11.4 (H)    Lymphocytes Relative 11/12/2022 12    Lymphs Abs 11/12/2022 1.7    Monocytes Relative 11/12/2022 3    Monocytes Absolute 11/12/2022 0.4    Eosinophils Relative 11/12/2022 0    Eosinophils Absolute 11/12/2022 0.0    Basophils Relative 11/12/2022 0    Basophils Absolute 11/12/2022 0.1    Immature Granulocytes 11/12/2022 1    Abs Immature Granulocytes 11/12/2022 0.13 (H)    Sodium 11/12/2022 140    Potassium 11/12/2022 5.4 (H)     Chloride 11/12/2022 105    CO2 11/12/2022 23    Glucose, Bld 11/12/2022 123 (H)    BUN 11/12/2022 10    Creatinine, Ser 11/12/2022 1.11    Calcium 11/12/2022 9.0    Total Protein 11/12/2022 7.4    Albumin 11/12/2022 4.0    AST 11/12/2022 24    ALT 11/12/2022 20    Alkaline Phosphatase 11/12/2022 64    Total Bilirubin 11/12/2022 0.3    GFR, Estimated 11/12/2022 >60    Anion gap 11/12/2022 12    Alcohol, Ethyl (B) 11/12/2022 <10    Valproic Acid Lvl 11/12/2022 57    Ammonia 11/13/2022 46 (H)    Sodium 11/13/2022 136    Potassium 11/13/2022 3.5    Chloride 11/13/2022 103    CO2 11/13/2022 23    Glucose, Bld 11/13/2022 95    BUN 11/13/2022 9    Creatinine, Ser 11/13/2022 0.83    Calcium 11/13/2022 7.8 (L)    GFR, Estimated 11/13/2022 >60    Anion gap 11/13/2022 10    Lactic Acid, Venous 11/13/2022 1.6    Lactic Acid,  Venous 11/13/2022 1.0    WBC 11/13/2022 16.7 (H)    RBC 11/13/2022 4.22    Hemoglobin 11/13/2022 13.4    HCT 11/13/2022 40.3    MCV 11/13/2022 95.5    MCH 11/13/2022 31.8    MCHC 11/13/2022 33.3    RDW 11/13/2022 13.2    Platelets 11/13/2022 223    nRBC 11/13/2022 0.0    Specimen Description 11/14/2022 URINE, CLEAN CATCH    Special Requests 11/14/2022 NONE    Culture 11/14/2022                     Value:NO GROWTH Performed at [facility], [address] [place], [place] [address]    Report Status 11/14/2022 11/15/2022 FINAL    Specimen Description 11/13/2022 BLOOD SITE NOT SPECIFIED    Special Requests 11/13/2022 BOTTLES DRAWN AEROBIC AND ANAEROBIC Blood Culture adequate volume    Culture 11/13/2022                     Value:NO GROWTH 5 DAYS Performed at [facility], [address], [place], [place] [address]    Report Status 11/13/2022 11/18/2022 FINAL    Specimen Description 11/13/2022 BLOOD SITE NOT SPECIFIED    Special Requests 11/13/2022 BOTTLES DRAWN AEROBIC AND ANAEROBIC Blood Culture adequate volume    Culture  Setup Time  11/13/2022                     Value:GRAM POSITIVE COCCI IN CLUSTERS ANAEROBIC BOTTLE ONLY CRITICAL RESULT CALLED TO, READ BACK BY AND VERIFIED WITH: PHARMD M. BITONTI 11/13/22 @ 2233 BY AB    Culture 11/13/2022  (A)                    Value:STAPHYLOCOCCUS HOMINIS THE SIGNIFICANCE OF ISOLATING THIS ORGANISM FROM A SINGLE SET OF BLOOD CULTURES WHEN MULTIPLE SETS ARE DRAWN IS UNCERTAIN. PLEASE NOTIFY THE MICROBIOLOGY DEPARTMENT WITHIN ONE WEEK IF SPECIATION AND SENSITIVITIES ARE REQUIRED. Performed at [facility], [address], [place], [place] [address]    Report Status 11/13/2022 11/15/2022 FINAL    Magnesium 11/13/2022 1.9    Procalcitonin 11/13/2022 <0.10    Phosphorus 11/13/2022 4.7 (H)    Opiates 11/13/2022 NONE DETECTED    Cocaine 11/13/2022 NONE DETECTED    Benzodiazepines 11/13/2022 POSITIVE (A)    Amphetamines 11/13/2022 NONE DETECTED    Tetrahydrocannabinol 11/13/2022 POSITIVE (A)    Barbiturates 11/13/2022 NONE DETECTED    Color, Urine 11/13/2022 YELLOW    APPearance 11/13/2022 HAZY (A)    Specific Gravity, Urine 11/13/2022 1.033 (H)    pH 11/13/2022 7.0    Glucose, UA 11/13/2022 NEGATIVE    Hgb urine dipstick 11/13/2022 NEGATIVE    Bilirubin Urine 11/13/2022 NEGATIVE    Ketones, ur 11/13/2022 5 (A)    Protein, ur 11/13/2022 NEGATIVE    Nitrite 11/13/2022 NEGATIVE    Leukocytes,Ua 11/13/2022 NEGATIVE    Phosphorus 11/14/2022 3.5    Magnesium 11/14/2022 2.0    WBC 11/14/2022 7.3    RBC 11/14/2022 4.35    Hemoglobin 11/14/2022 13.7    HCT 11/14/2022 42.0    MCV 11/14/2022 96.6    MCH 11/14/2022 31.5    MCHC 11/14/2022 32.6    RDW 11/14/2022 13.2    Platelets 11/14/2022 197    nRBC 11/14/2022 0.0    Sodium 11/14/2022 141    Potassium 11/14/2022 4.1    Chloride 11/14/2022 110    CO2 11/14/2022  21 (L)    Glucose, Bld 11/14/2022 94    BUN 11/14/2022 14    Creatinine, Ser 11/14/2022 0.95    Calcium 11/14/2022 8.2 (L)    GFR, Estimated 11/14/2022 >60     Anion gap 11/14/2022 10    Enterococcus faecalis 11/13/2022 NOT DETECTED    Enterococcus Faecium 11/13/2022 NOT DETECTED    Listeria monocytogenes 11/13/2022 NOT DETECTED    Staphylococcus species 11/13/2022 DETECTED (A)    Staphylococcus aureus (B* 11/13/2022 NOT DETECTED    Staphylococcus epidermid* 11/13/2022 NOT DETECTED    Staphylococcus lugdunens* 11/13/2022 NOT DETECTED    Streptococcus species 11/13/2022 NOT DETECTED    Streptococcus agalactiae 11/13/2022 NOT DETECTED    Streptococcus pneumoniae 11/13/2022 NOT DETECTED    Streptococcus pyogenes 11/13/2022 NOT DETECTED    A.calcoaceticus-baumannii 11/13/2022 NOT DETECTED    Bacteroides fragilis 11/13/2022 NOT DETECTED    Enterobacterales 11/13/2022 NOT DETECTED    Enterobacter cloacae com* 11/13/2022 NOT DETECTED    Escherichia coli 11/13/2022 NOT DETECTED    Klebsiella aerogenes 11/13/2022 NOT DETECTED    Klebsiella oxytoca 11/13/2022 NOT DETECTED    Klebsiella pneumoniae 11/13/2022 NOT DETECTED    Proteus species 11/13/2022 NOT DETECTED    Salmonella species 11/13/2022 NOT DETECTED    Serratia marcescens 11/13/2022 NOT DETECTED    Haemophilus influenzae 11/13/2022 NOT DETECTED    Neisseria meningitidis 11/13/2022 NOT DETECTED    Pseudomonas aeruginosa 11/13/2022 NOT DETECTED    Stenotrophomonas maltoph* 11/13/2022 NOT DETECTED    Candida albicans 11/13/2022 NOT DETECTED    Candida auris 11/13/2022 NOT DETECTED    Candida glabrata 11/13/2022 NOT DETECTED    Candida krusei 11/13/2022 NOT DETECTED    Candida parapsilosis 11/13/2022 NOT DETECTED    Candida tropicalis 11/13/2022 NOT DETECTED    Cryptococcus neoformans/* 11/13/2022 NOT DETECTED   Admission on 10/06/2022, Discharged on 10/09/2022  Component Date Value   WBC 10/06/2022 16.5 (H)    RBC 10/06/2022 4.84    Hemoglobin 10/06/2022 15.2    HCT 10/06/2022 47.1    MCV 10/06/2022 97.3    MCH 10/06/2022 31.4    MCHC 10/06/2022 32.3    RDW 10/06/2022 13.5     Platelets 10/06/2022 265    nRBC 10/06/2022 0.0    Neutrophils Relative % 10/06/2022 85    Neutro Abs 10/06/2022 14.1 (H)    Lymphocytes Relative 10/06/2022 8    Lymphs Abs 10/06/2022 1.3    Monocytes Relative 10/06/2022 5    Monocytes Absolute 10/06/2022 0.8    Eosinophils Relative 10/06/2022 0    Eosinophils Absolute 10/06/2022 0.0    Basophils Relative 10/06/2022 0    Basophils Absolute 10/06/2022 0.1    Immature Granulocytes 10/06/2022 2    Abs Immature Granulocytes 10/06/2022 0.28 (H)    Sodium 10/06/2022 135    Potassium 10/06/2022 5.3 (H)    Chloride 10/06/2022 104    CO2 10/06/2022 18 (L)    Glucose, Bld 10/06/2022 110 (H)    BUN 10/06/2022 12    Creatinine, Ser 10/06/2022 1.08    Calcium 10/06/2022 8.3 (L)    Total Protein 10/06/2022 6.7    Albumin 10/06/2022 4.1    AST 10/06/2022 42 (H)    ALT 10/06/2022 24    Alkaline Phosphatase 10/06/2022 57    Total Bilirubin 10/06/2022 1.0    GFR, Estimated 10/06/2022 >60    Anion gap 10/06/2022 13    Glucose-Capillary 10/06/2022 118 (H)    Color, Urine 10/07/2022 YELLOW    APPearance 10/07/2022 CLEAR  Specific Gravity, Urine 10/07/2022 1.020    pH 10/07/2022 6.0    Glucose, UA 10/07/2022 NEGATIVE    Hgb urine dipstick 10/07/2022 NEGATIVE    Bilirubin Urine 10/07/2022 NEGATIVE    Ketones, ur 10/07/2022 NEGATIVE    Protein, ur 10/07/2022 NEGATIVE    Nitrite 10/07/2022 NEGATIVE    Leukocytes,Ua 10/07/2022 NEGATIVE    Total CK 10/07/2022 510 (H)    WBC 10/08/2022 6.8    RBC 10/08/2022 4.06 (L)    Hemoglobin 10/08/2022 13.2    HCT 10/08/2022 39.7    MCV 10/08/2022 97.8    MCH 10/08/2022 32.5    MCHC 10/08/2022 33.2    RDW 10/08/2022 13.6    Platelets 10/08/2022 223    nRBC 10/08/2022 0.0    Sodium 10/08/2022 143    Potassium 10/08/2022 3.9    Chloride 10/08/2022 107    CO2 10/08/2022 23    Glucose, Bld 10/08/2022 76    BUN 10/08/2022 10    Creatinine, Ser 10/08/2022 1.27 (H)    Calcium 10/08/2022 8.3 (L)     Total Protein 10/08/2022 5.8 (L)    Albumin 10/08/2022 3.3 (L)    AST 10/08/2022 21    ALT 10/08/2022 19    Alkaline Phosphatase 10/08/2022 45    Total Bilirubin 10/08/2022 0.8    GFR, Estimated 10/08/2022 >60    Anion gap 10/08/2022 13    Valproic Acid Lvl 10/08/2022 80    Sodium 10/09/2022 139    Potassium 10/09/2022 3.6    Chloride 10/09/2022 109    CO2 10/09/2022 23    Glucose, Bld 10/09/2022 90    BUN 10/09/2022 9    Creatinine, Ser 10/09/2022 1.03    Calcium 10/09/2022 8.2 (L)    GFR, Estimated 10/09/2022 >60    Anion gap 10/09/2022 7   Office Visit on 08/08/2022  Component Date Value   WBC 08/08/2022 5.5    RBC 08/08/2022 4.58    Hemoglobin 08/08/2022 14.6    Hematocrit 08/08/2022 43.0    MCV 08/08/2022 94    MCH 08/08/2022 31.9    MCHC 08/08/2022 34.0    RDW 08/08/2022 12.9    Platelets 08/08/2022 300    Neutrophils 08/08/2022 63    Lymphs 08/08/2022 27    Monocytes 08/08/2022 8    Eos 08/08/2022 1    Basos 08/08/2022 1    Neutrophils Absolute 08/08/2022 3.5    Lymphocytes Absolute 08/08/2022 1.5    Monocytes Absolute 08/08/2022 0.4    EOS (ABSOLUTE) 08/08/2022 0.1    Basophils Absolute 08/08/2022 0.0    Immature Granulocytes 08/08/2022 0    Immature Grans (Abs) 08/08/2022 0.0    IgG (Immunoglobin G), Se* 08/08/2022 1,112    IgA/Immunoglobulin A, Se* 08/08/2022 133    IgM (Immunoglobulin M), * 08/08/2022 20    Vit D, 25-Hydroxy 08/08/2022 40.9   Admission on 06/13/2022, Discharged on 06/16/2022  Component Date Value   Glucose-Capillary 06/13/2022 108 (H)    WBC 06/13/2022 14.8 (H)    RBC 06/13/2022 4.33    Hemoglobin 06/13/2022 13.7    HCT 06/13/2022 43.3    MCV 06/13/2022 100.0    MCH 06/13/2022 31.6    MCHC 06/13/2022 31.6    RDW 06/13/2022 13.8    Platelets 06/13/2022 268    nRBC 06/13/2022 0.0    Neutrophils Relative % 06/13/2022 93    Neutro Abs 06/13/2022 13.9 (H)    Lymphocytes Relative 06/13/2022 3    Lymphs Abs 06/13/2022 0.5 (L)  Monocytes Relative 06/13/2022 3    Monocytes Absolute 06/13/2022 0.4    Eosinophils Relative 06/13/2022 0    Eosinophils Absolute 06/13/2022 0.0    Basophils Relative 06/13/2022 0    Basophils Absolute 06/13/2022 0.0    Immature Granulocytes 06/13/2022 1    Abs Immature Granulocytes 06/13/2022 0.07    Sodium 06/13/2022 138    Potassium 06/13/2022 4.1    Chloride 06/13/2022 104    CO2 06/13/2022 21 (L)    Glucose, Bld 06/13/2022 98    BUN 06/13/2022 7    Creatinine, Ser 06/13/2022 1.01    Calcium 06/13/2022 8.7 (L)    Total Protein 06/13/2022 7.1    Albumin 06/13/2022 4.2    AST 06/13/2022 43 (H)    ALT 06/13/2022 20    Alkaline Phosphatase 06/13/2022 65    Total Bilirubin 06/13/2022 0.7    GFR, Estimated 06/13/2022 >60    Anion gap 06/13/2022 13    Lactic Acid, Venous 06/13/2022 2.7 (HH)    Lactic Acid, Venous 06/13/2022 2.3 (HH)    Opiates 06/13/2022 NONE DETECTED    Cocaine 06/13/2022 NONE DETECTED    Benzodiazepines 06/13/2022 POSITIVE (A)    Amphetamines 06/13/2022 NONE DETECTED    Tetrahydrocannabinol 06/13/2022 POSITIVE (A)    Barbiturates 06/13/2022 NONE DETECTED    Magnesium 06/13/2022 1.8    Levetiracetam Lvl 06/13/2022 <2.0 (L)    pH, Arterial 06/13/2022 7.263 (L)    pCO2 arterial 06/13/2022 51.3 (H)    pO2, Arterial 06/13/2022 510 (H)    Bicarbonate 06/13/2022 23.1    TCO2 06/13/2022 25    O2 Saturation 06/13/2022 100    Acid-base deficit 06/13/2022 4.0 (H)    Sodium 06/13/2022 139    Potassium 06/13/2022 3.7    Calcium, Ion 06/13/2022 1.12 (L)    HCT 06/13/2022 37.0 (L)    Hemoglobin 06/13/2022 12.6 (L)    Patient temperature 06/13/2022 99.5 F    Collection site 06/13/2022 RADIAL, ALLEN'S TEST ACCEPTABLE    Drawn by 06/13/2022 RT    Sample type 06/13/2022 ARTERIAL    Triglycerides 06/14/2022 153 (H)    MRSA by PCR Next Gen 06/13/2022 NOT DETECTED    Glucose-Capillary 06/13/2022 93    WBC 06/14/2022 7.8    RBC 06/14/2022 3.97 (L)    Hemoglobin  06/14/2022 12.4 (L)    HCT 06/14/2022 37.5 (L)    MCV 06/14/2022 94.5    MCH 06/14/2022 31.2    MCHC 06/14/2022 33.1    RDW 06/14/2022 13.8    Platelets 06/14/2022 240    nRBC 06/14/2022 0.0    Sodium 06/14/2022 141    Potassium 06/14/2022 3.6    Chloride 06/14/2022 107    CO2 06/14/2022 24    Glucose, Bld 06/14/2022 97    BUN 06/14/2022 6    Creatinine, Ser 06/14/2022 0.95    Calcium 06/14/2022 8.4 (L)    GFR, Estimated 06/14/2022 >60    Anion gap 06/14/2022 10    Magnesium 06/14/2022 2.0    Glucose-Capillary 06/13/2022 92    Glucose-Capillary 06/14/2022 95    Glucose-Capillary 06/14/2022 113 (H)    Glucose-Capillary 06/14/2022 95    WBC 06/15/2022 8.5    RBC 06/15/2022 4.00 (L)    Hemoglobin 06/15/2022 12.7 (L)    HCT 06/15/2022 38.1 (L)    MCV 06/15/2022 95.3    MCH 06/15/2022 31.8    MCHC 06/15/2022 33.3    RDW 06/15/2022 13.6    Platelets 06/15/2022 212    nRBC 06/15/2022 0.0  Sodium 06/15/2022 141    Potassium 06/15/2022 3.1 (L)    Chloride 06/15/2022 106    CO2 06/15/2022 24    Glucose, Bld 06/15/2022 100 (H)    BUN 06/15/2022 9    Creatinine, Ser 06/15/2022 0.99    Calcium 06/15/2022 8.4 (L)    GFR, Estimated 06/15/2022 >60    Anion gap 06/15/2022 11    Glucose-Capillary 06/14/2022 122 (H)    Glucose-Capillary 06/15/2022 105 (H)    Sodium 06/16/2022 142    Potassium 06/16/2022 3.9    Chloride 06/16/2022 109    CO2 06/16/2022 22    Glucose, Bld 06/16/2022 100 (H)    BUN 06/16/2022 9    Creatinine, Ser 06/16/2022 1.06    Calcium 06/16/2022 8.7 (L)    GFR, Estimated 06/16/2022 >60    Anion gap 06/16/2022 11    Magnesium 06/16/2022 2.0   Office Visit on 06/03/2022  Component Date Value   WBC 06/03/2022 7.7    RBC 06/03/2022 4.63    Hemoglobin 06/03/2022 14.6    HCT 06/03/2022 42.7    MCV 06/03/2022 92.2    MCH 06/03/2022 31.5    MCHC 06/03/2022 34.2    RDW 06/03/2022 13.1    Platelets 06/03/2022 278    MPV 06/03/2022 10.6    Neutro Abs  06/03/2022 5,198    Lymphs Abs 06/03/2022 1,786    Absolute Monocytes 06/03/2022 624    Eosinophils Absolute 06/03/2022 54    Basophils Absolute 06/03/2022 39    Neutrophils Relative % 06/03/2022 67.5    Total Lymphocyte 06/03/2022 23.2    Monocytes Relative 06/03/2022 8.1    Eosinophils Relative 06/03/2022 0.7    Basophils Relative 06/03/2022 0.5    Glucose, Bld 06/03/2022 92    BUN 06/03/2022 14    Creat 06/03/2022 1.01    BUN/Creatinine Ratio 06/03/2022 SEE NOTE:    Sodium 06/03/2022 136    Potassium 06/03/2022 4.0    Chloride 06/03/2022 101    CO2 06/03/2022 24    Calcium 06/03/2022 9.7    Total Protein 06/03/2022 7.3    Albumin 06/03/2022 4.6    Globulin 06/03/2022 2.7    AG Ratio 06/03/2022 1.7    Total Bilirubin 06/03/2022 0.5    Alkaline phosphatase (AP* 06/03/2022 74    AST 06/03/2022 13    ALT 06/03/2022 15    Cholesterol 06/03/2022 199    HDL 06/03/2022 69    Triglycerides 06/03/2022 77    LDL Cholesterol (Calc) 06/03/2022 113 (H)    Total CHOL/HDL Ratio 06/03/2022 2.9    Non-HDL Cholesterol (Cal* 06/03/2022 130 (H)    Vit D, 25-Hydroxy 06/03/2022 33    Keppra (Levetiracetam) 06/03/2022 <2.0 (L)   Admission on 04/25/2022, Discharged on 04/28/2022  Component Date Value   Sodium 04/25/2022 137    Potassium 04/25/2022 3.9    Chloride 04/25/2022 100    CO2 04/25/2022 25    Glucose, Bld 04/25/2022 114 (H)    BUN 04/25/2022 5 (L)    Creatinine, Ser 04/25/2022 0.96    Calcium 04/25/2022 8.7 (L)    Total Protein 04/25/2022 6.7    Albumin 04/25/2022 3.9    AST 04/25/2022 62 (H)    ALT 04/25/2022 59 (H)    Alkaline Phosphatase 04/25/2022 82    Total Bilirubin 04/25/2022 0.5    GFR, Estimated 04/25/2022 >60    Anion gap 04/25/2022 12    Glucose-Capillary 04/25/2022 129 (H)    WBC 04/25/2022 17.6 (H)    RBC 04/25/2022 4.42  Hemoglobin 04/25/2022 14.3    HCT 04/25/2022 43.1    MCV 04/25/2022 97.5    MCH 04/25/2022 32.4    MCHC 04/25/2022 33.2    RDW  04/25/2022 13.1    Platelets 04/25/2022 237    nRBC 04/25/2022 0.0    Neutrophils Relative % 04/25/2022 91    Neutro Abs 04/25/2022 16.0 (H)    Lymphocytes Relative 04/25/2022 5    Lymphs Abs 04/25/2022 0.9    Monocytes Relative 04/25/2022 3    Monocytes Absolute 04/25/2022 0.5    Eosinophils Relative 04/25/2022 0    Eosinophils Absolute 04/25/2022 0.0    Basophils Relative 04/25/2022 0    Basophils Absolute 04/25/2022 0.0    Immature Granulocytes 04/25/2022 1    Abs Immature Granulocytes 04/25/2022 0.09 (H)    Opiates 04/26/2022 NONE DETECTED    Cocaine 04/26/2022 NONE DETECTED    Benzodiazepines 04/26/2022 NONE DETECTED    Amphetamines 04/26/2022 NONE DETECTED    Tetrahydrocannabinol 04/26/2022 POSITIVE (A)    Barbiturates 04/26/2022 NONE DETECTED    Levetiracetam Lvl 04/25/2022 <2.0 (L)    Specimen Source 04/26/2022 URINE, CLEAN CATCH    Color, Urine 04/26/2022 YELLOW    APPearance 04/26/2022 HAZY (A)    Specific Gravity, Urine 04/26/2022 1.027    pH 04/26/2022 7.0    Glucose, UA 04/26/2022 NEGATIVE    Hgb urine dipstick 04/26/2022 NEGATIVE    Bilirubin Urine 04/26/2022 NEGATIVE    Ketones, ur 04/26/2022 NEGATIVE    Protein, ur 04/26/2022 30 (A)    Nitrite 04/26/2022 NEGATIVE    Leukocytes,Ua 04/26/2022 NEGATIVE    RBC / HPF 04/26/2022 0-5    WBC, UA 04/26/2022 0-5    Bacteria, UA 04/26/2022 NONE SEEN    Squamous Epithelial / HPF 04/26/2022 0-5    Mucus 04/26/2022 PRESENT    SARS Coronavirus 2 by RT* 04/25/2022 NEGATIVE    Influenza A by PCR 04/25/2022 NEGATIVE    Influenza B by PCR 04/25/2022 NEGATIVE    Resp Syncytial Virus by * 04/25/2022 NEGATIVE    HIV Screen 4th Generatio* 04/26/2022 Non Reactive    WBC 04/26/2022 15.3 (H)    RBC 04/26/2022 3.85 (L)    Hemoglobin 04/26/2022 12.3 (L)    HCT 04/26/2022 37.8 (L)    MCV 04/26/2022 98.2    MCH 04/26/2022 31.9    MCHC 04/26/2022 32.5    RDW 04/26/2022 13.2    Platelets 04/26/2022 220    nRBC 04/26/2022 0.0     Sodium 04/26/2022 137    Potassium 04/26/2022 3.5    Chloride 04/26/2022 104    CO2 04/26/2022 25    Glucose, Bld 04/26/2022 107 (H)    BUN 04/26/2022 6    Creatinine, Ser 04/26/2022 0.87    Calcium 04/26/2022 8.2 (L)    Total Protein 04/26/2022 5.7 (L)    Albumin 04/26/2022 3.2 (L)    AST 04/26/2022 50 (H)    ALT 04/26/2022 56 (H)    Alkaline Phosphatase 04/26/2022 65    Total Bilirubin 04/26/2022 0.4    GFR, Estimated 04/26/2022 >60    Anion gap 04/26/2022 8    Sodium 04/27/2022 136    Potassium 04/27/2022 3.5    Chloride 04/27/2022 101    CO2 04/27/2022 22    Glucose, Bld 04/27/2022 99    BUN 04/27/2022 9    Creatinine, Ser 04/27/2022 0.93    Calcium 04/27/2022 8.7 (L)    Total Protein 04/27/2022 6.4 (L)    Albumin 04/27/2022 3.7    AST 04/27/2022  42 (H)    ALT 04/27/2022 56 (H)    Alkaline Phosphatase 04/27/2022 65    Total Bilirubin 04/27/2022 0.8    GFR, Estimated 04/27/2022 >60    Anion gap 04/27/2022 13    WBC 04/27/2022 8.3    RBC 04/27/2022 4.20 (L)    Hemoglobin 04/27/2022 13.5    HCT 04/27/2022 39.9    MCV 04/27/2022 95.0    MCH 04/27/2022 32.1    MCHC 04/27/2022 33.8    RDW 04/27/2022 12.7    Platelets 04/27/2022 179    nRBC 04/27/2022 0.0    Magnesium 04/27/2022 1.9   No image results found. DG CHEST PORT 1 VIEW Result Date: 02/15/2023 CLINICAL DATA:  Acute respiratory failure. EXAM: PORTABLE CHEST 1 VIEW COMPARISON:  02/13/2023 FINDINGS: Endotracheal tube tip is 4.9 cm above the base of the carina. NG tube is been removed in the interval. Marked interval improvement in aeration at the left base with some minimal residual retrocardiac airspace disease on the current study. Right lung clear. The cardiopericardial silhouette is within normal limits for size. Telemetry leads overlie the chest. IMPRESSION: 1. Endotracheal tube tip is 4.9 cm above the base of the carina. 2. Marked improvement in retrocardiac opacity seen previously. There is some minimal residual  airspace disease in the retrocardiac left base on the current study. Electronically Signed   By: Kennith Center M.D.   On: 02/15/2023 11:12   DG Abd 1 View Result Date: 02/15/2023 CLINICAL DATA:  Vomiting.  Ileus. EXAM: ABDOMEN - 1 VIEW COMPARISON:  02/13/2023 FINDINGS: Moderate gaseous distention of the stomach. Air is seen scattered through mildly distended small bowel without an overtly obstructive pattern. Gas and stool is noted in the left colon and rectum. Lucency over the low pelvis is presumably in the rectum but has a configuration that resembles the orientation of the urinary bladder. IMPRESSION: 1. Moderate gaseous distention of the stomach with mildly distended small bowel and gas and stool visible in the left colon. No findings to suggest bowel obstruction. 2. Lucency over the low pelvis is presumably in the rectum but has a configuration that resembles the orientation of the urinary bladder. Correlation with urinalysis or a history of recent instrumentation suggested. Further workup with pelvis x-rays including a decubitus view may prove helpful. Electronically Signed   By: Kennith Center M.D.   On: 02/15/2023 07:11   MR SACRUM SI JOINTS W WO CONTRAST Result Date: 02/14/2023 CLINICAL DATA:  Sacral decubitus ulcer.  Evaluate for osteomyelitis. EXAM: MRI SACRUM WITHOUT AND WITH CONTRAST TECHNIQUE: Multiplanar and multiecho pulse sequences of the sacrum were obtained without and with intravenous contrast. CONTRAST:  6mL GADAVIST GADOBUTROL 1 MMOL/ML IV SOLN COMPARISON:  None Available. FINDINGS: Bones/Joint/Cartilage No marrow signal abnormality. No fracture or dislocation. Joint spaces are preserved. No joint effusion. Ligaments Collateral ligaments are intact. Muscles and Tendons Intact.  No muscle edema or atrophy. Soft tissue Small superficial midline decubitus ulcer over the mid sacrum. No fluid collection. No soft tissue mass. Foley catheter and dependent air within the bladder. Trace free fluid  in the pelvis. IMPRESSION: 1. Small superficial midline decubitus ulcer over the mid sacrum. No abscess or osteomyelitis. Electronically Signed   By: Obie Dredge M.D.   On: 02/14/2023 18:03   Overnight EEG with video Result Date: 02/14/2023 Charlsie Quest, MD     02/15/2023  9:25 AM Patient Name: Miguel Black MRN: [id number] Epilepsy Attending: Charlsie Quest Referring Physician/Provider: Gordy Councilman, MD Duration: 02/13/2023  1718 to 02/14/2023 1930 Patient history: 33 year old male with history of epilepsy presented with breakthrough seizures.  EEG to evaluate for seizure. Level of alertness:  comatose AEDs during EEG study: Depakote, propofol Technical aspects: This EEG study was done with scalp electrodes positioned according to the 10-20 International system of electrode placement. Electrical activity was reviewed with band pass filter of 1-70Hz , sensitivity of 7 uV/mm, display speed of 30mm/sec with a 60Hz  notched filter applied as appropriate. EEG data were recorded continuously and digitally stored.  Video monitoring was available and reviewed as appropriate. Description: EEG showed continuous generalized 3 to 6 Hz theta-delta slowing with overriding 15 to 18 Hz beta activity distributed symmetrically and diffusely. Hyperventilation and photic stimulation were not performed.   EEG was disconnected on 02/14/2023 between 1522 to 1835 for imaging ABNORMALITY - Continuous slow, generalized IMPRESSION: This study is suggestive of severe diffuse encephalopathy likely related to sedation. No seizures or epileptiform discharges were seen throughout the recording. Priyanka [name]   CT HEAD WO CONTRAST ( ) Result Date: 02/14/2023 CLINICAL DATA:  33 year old male with seizure, multiple sclerosis. Anisocoria. EXAM: CT HEAD WITHOUT CONTRAST TECHNIQUE: Contiguous axial images were obtained from the base of the skull through the vertex without intravenous contrast. RADIATION DOSE REDUCTION: This exam  was performed according to the departmental dose-optimization program which includes automated exposure control, adjustment of the mA and/or kV according to patient size and/or use of iterative reconstruction technique. COMPARISON:  Brain MRI 11/14/2022.  Head CT yesterday. FINDINGS: Brain: Chronic cerebral white matter disease better demonstrated by MRI and most apparent by CT in the left periatrial region. Stable gray-white matter differentiation throughout the brain. No midline shift, ventriculomegaly, mass effect, evidence of mass lesion, intracranial hemorrhage or evidence of cortically based acute infarction. Vascular: Symmetric. No suspicious intracranial vascular hyperdensity. Skull: Stable and intact. Sinuses/Orbits: Visualized paranasal sinuses and mastoids are stable and well aerated. Other: Patient remains intubated on the scout view with associated fluid level in the pharynx. Disconjugate gaze but otherwise negative CT appearance of the orbits. Scalp EEG electrodes in place. IMPRESSION: 1. No acute intracranial abnormality by CT. Chronic multiple sclerosis. 2. Intubated.  Scalp EEG electrodes in place. Electronically Signed   By: Odessa Fleming M.D.   On: 02/14/2023 05:05   CT Head Wo Contrast Result Date: 02/13/2023 CLINICAL DATA:  Seizure disorder EXAM: CT HEAD WITHOUT CONTRAST TECHNIQUE: Contiguous axial images were obtained from the base of the skull through the vertex without intravenous contrast. RADIATION DOSE REDUCTION: This exam was performed according to the departmental dose-optimization program which includes automated exposure control, adjustment of the mA and/or kV according to patient size and/or use of iterative reconstruction technique. COMPARISON:  MRI 11/14/2022, CT brain 11/12/2022 FINDINGS: Brain: No acute territorial infarction, hemorrhage or intracranial mass. White matter hypodensities grossly similar, presumably corresponding to history of MS. Stable ventricle size Vascular: No  hyperdense vessels.  No unexpected calcification Skull: Normal. Negative for fracture or focal lesion. Sinuses/Orbits: Mucosal thickening in the sinuses. Other: None IMPRESSION: 1. No CT evidence for acute intracranial abnormality. 2. White matter hypodensities grossly similar, presumably corresponding to history of MS. Electronically Signed   By: Jasmine Pang M.D.   On: 02/13/2023 16:36   DG Chest Portable 1 View Result Date: 02/13/2023 CLINICAL DATA:  ET tube and enteric tube placement EXAM: PORTABLE CHEST 1 VIEW and limited abdominal x-ray for tube placement COMPARISON:  Chest x-ray 11/12/2022 and older. FINDINGS: ET tube in place with tip seen proximally 5 cm  above the carina. Enteric tube with the tip overlying the midbody of the stomach. Normal cardiopericardial silhouette. No pneumothorax, effusion or edema. There is a left retrocardiac opacity, consolidation. Overlapping cardiac leads. Chest x-ray is slightly rotated to the left. Limited x-ray of the upper abdomen has a gas in nondilated loops of bowel. The stomach is distended with air. IMPRESSION: ET tube and enteric tube in place. Left retrocardiac opacity.  Recommend follow-up Electronically Signed   By: Karen Kays M.D.   On: 02/13/2023 16:14   DG Abd Portable 1 View Result Date: 02/13/2023 CLINICAL DATA:  ET tube and enteric tube placement EXAM: PORTABLE CHEST 1 VIEW and limited abdominal x-ray for tube placement COMPARISON:  Chest x-ray 11/12/2022 and older. FINDINGS: ET tube in place with tip seen proximally 5 cm above the carina. Enteric tube with the tip overlying the midbody of the stomach. Normal cardiopericardial silhouette. No pneumothorax, effusion or edema. There is a left retrocardiac opacity, consolidation. Overlapping cardiac leads. Chest x-ray is slightly rotated to the left. Limited x-ray of the upper abdomen has a gas in nondilated loops of bowel. The stomach is distended with air. IMPRESSION: ET tube and enteric tube in place.  Left retrocardiac opacity.  Recommend follow-up Electronically Signed   By: Karen Kays M.D.   On: 02/13/2023 16:14    Assessment & Plan Seizure disorder (HCC) Assessment and Plan Seizure Disorder Recurrent seizures persist despite multiple anti-seizure medications, including Topiramate, Valproic Acid, and Diazepam. A recent minor seizure caused temporary regression in walking and standing, which resolved in a few days. Current treatment involves adjusted dosages of Topiramate and Valproic Acid. Emphasized the importance of adequate sleep, seizure detection by his dog, and using the [employer] Alarm app with an [employer]. An urgent neurology follow-up is necessary for potential medication adjustments, and the potential benefits of CBD for seizure control were discussed. Aggressive management of infections is crucial to prevent seizure exacerbation. Continue Topiramate and Valproic Acid, use the [employer] Alarm app, reorder Diazepam nasal spray (Valtoco) with two refills, and monitor for signs of infection, reporting any fevers immediately. Discuss CBD use with the neurologist. y infusions are essential to manage MS symptoms and prevent relapses. Ensure the Ocrevus infusion is scheduled for April 2nd and follow up with the new neurologist urgently.  Follow-up An urgent neurology referral is needed, schedule the annual physical in November, and refresh the physical therapy referral. multiple sclerosis No new lesions on MRI to explain worsening seizure(s) disorder(s)  Multiple Sclerosis (MS) The next Ocrevus (ocrelizumab) infusion is due on April 2nd. A new neurologist referral has been made due to dissatisfaction with the previous one. Timel Breakthrough seizure (HCC) Infection Management A recent infection with fever and pus from a sore on the back was treated with cefadroxil, with no current signs of infection. Prompt treatment of future infections is vital to prevent seizure triggers. Monitor for any  signs of infection and note that the cefadroxil antibiotic course is completed. Ataxia Refer care to physical therapy   General Health Maintenance The last annual physical exam was in November, with no new health maintenance issues discussed. Schedule a follow-up for the annual physical in November.  Follow-up An urgent neurology referral is needed, schedule the annual physical in November, and refresh the physical therapy referral.     Orders Placed During this Encounter:   Orders Placed This Encounter  Procedures   Ambulatory referral to Neurology    Referral Priority:   Emergency    Referral  Reason:   Specialty Services Required   Meds ordered this encounter  Medications   diazePAM, 15 MG Dose, (VALTOCO 15 MG DOSE) 2 x 7.5 MG/0.1ML LQPK    Sig: Place 1 Dose into the nose daily as needed.    Dispense:  2 each    Refill:  2       This document was synthesized by artificial intelligence ([employer]) using HIPAA-compliant recording of the clinical interaction;   We discussed the use of AI scribe software for clinical note transcription with the patient, who gave verbal consent to proceed.    Additional Info: This encounter employed state-of-the-art, real-time, collaborative documentation. The patient actively reviewed and assisted in updating their electronic medical record on a shared screen, ensuring transparency and facilitating joint problem-solving for the problem list, overview, and plan. This approach promotes accurate, informed care. The treatment plan was discussed and reviewed in detail, including medication safety, potential side effects, and all patient questions. We confirmed understanding and comfort with the plan. Follow-up instructions were established, including contacting the office for any concerns, returning if symptoms worsen, persist, or new symptoms develop, and precautions for potential emergency department visits.

## 2023-03-09 NOTE — Assessment & Plan Note (Signed)
Assessment and Plan Seizure Disorder Recurrent seizures persist despite multiple anti-seizure medications, including Topiramate, Valproic Acid, and Diazepam. A recent minor seizure caused temporary regression in walking and standing, which resolved in a few days. Current treatment involves adjusted dosages of Topiramate and Valproic Acid. Emphasized the importance of adequate sleep, seizure detection by his dog, and using the [employer] Alarm app with an [employer]. An urgent neurology follow-up is necessary for potential medication adjustments, and the potential benefits of CBD for seizure control were discussed. Aggressive management of infections is crucial to prevent seizure exacerbation. Continue Topiramate and Valproic Acid, use the [employer] Alarm app, reorder Diazepam nasal spray (Valtoco) with two refills, and monitor for signs of infection, reporting any fevers immediately. Discuss CBD use with the neurologist. y infusions are essential to manage MS symptoms and prevent relapses. Ensure the Ocrevus infusion is scheduled for April 2nd and follow up with the new neurologist urgently.  Follow-up An urgent neurology referral is needed, schedule the annual physical in November, and refresh the physical therapy referral.

## 2023-03-09 NOTE — Assessment & Plan Note (Signed)
No new lesions on MRI to explain worsening seizure(s) disorder(s)  Multiple Sclerosis (MS) The next Ocrevus (ocrelizumab) infusion is due on April 2nd. A new neurologist referral has been made due to dissatisfaction with the previous one. Timel

## 2023-03-09 NOTE — Patient Instructions (Addendum)
VISIT SUMMARY:  Miguel Black, a 33 year old male with a seizure disorder, visited today due to ongoing seizure activity despite multiple medications. He also has a history of Multiple Sclerosis (MS) and recently experienced an infection that was treated successfully. We discussed his current treatment plan, the importance of managing infections, and the need for follow-up with a neurologist.  YOUR PLAN:  -SEIZURE DISORDER: Seizure disorder involves recurrent, uncontrolled electrical activity in the brain. Despite multiple medications, Miguel Black continues to experience seizures. We have adjusted the dosages of Topiramate and Valproic Acid and emphasized the importance of adequate sleep, using his dog for seizure detection, and utilizing the [facility] Alarm app with his Apple Watch. An urgent follow-up with a neurologist is necessary for potential medication adjustments. We also discussed the potential benefits of CBD for seizure control. Continue taking Topiramate and Valproic Acid, use the [employer] Alarm app, reorder Diazepam nasal spray (Valtoco) with two refills, and monitor for signs of infection, reporting any fevers immediately.  -MULTIPLE SCLEROSIS (MS): Multiple Sclerosis (MS) is a disease where the immune system attacks the protective covering of nerves. Miguel Black's next Ocrevus infusion is due on April 2nd, and a new neurologist referral has been made. Timely infusions are essential to manage MS symptoms and prevent relapses. Ensure the Ocrevus infusion is scheduled and follow up with the new neurologist urgently.  -INFECTION MANAGEMENT: Infections can trigger seizures and worsen MS symptoms. Miguel Black recently had an infection treated with cefadroxil, and there are no current signs of infection. It is crucial to monitor for any signs of infection and treat them promptly to prevent seizure triggers.  -GENERAL HEALTH MAINTENANCE: Miguel Black's last annual physical exam was in November, and no new health  maintenance issues were discussed today. It is important to schedule a follow-up for the annual physical in November.  INSTRUCTIONS:  Please schedule an urgent follow-up with a neurologist to discuss potential medication adjustments and the use of CBD for seizure control. Ensure your Ocrevus infusion is scheduled for April 2nd and follow up with the new neurologist. Monitor for any signs of infection and report any fevers immediately. Schedule your annual physical in November and refresh your physical therapy referral.  It was a pleasure seeing you today! Your health and satisfaction are our top priorities.  Glenetta Hew, MD  Your Providers PCP: Lula Olszewski, MD,  [phone]) Referring Provider: Lula Olszewski, MD,  [phone]) Care Team Provider: Asa Lente, MD,  [phone]) Care Team Provider: Shawnie Dapper, NP,  [phone])     NEXT STEPS: [x]  Early Intervention: Schedule sooner appointment, call our on-call services, or go to emergency room if there is any significant Increase in pain or discomfort New or worsening symptoms Sudden or severe changes in your health [x]  Flexible Follow-Up: We recommend a No follow-ups on file. for optimal routine care. This allows for progress monitoring and treatment adjustments. [x]  Preventive Care: Schedule your annual preventive care visit! It's typically covered by insurance and helps identify potential health issues early. [x]  Lab & X-ray Appointments: Incomplete tests scheduled today, or call to schedule. X-rays: [facility] (M-F, 8:30am-noon or 1pm-5pm). [x]  Medical Information Release: Sign a release form at front desk to obtain relevant medical information we don't have.  MAKING THE MOST OF OUR FOCUSED 20 MINUTE APPOINTMENTS: [x]   Clearly state your top concerns at the beginning of the visit to focus our discussion [x]   If you anticipate you will need more time, please inform the front desk during  scheduling - we can book multiple appointments in the same week. [x]   If you have transportation problems- use our convenient video appointments or ask about transportation support. [x]   We can get down to business faster if you use MyChart to update information before the visit and submit non-urgent questions before your visit. Thank you for taking the time to provide details through MyChart.  Let our nurse know and she can import this information into your encounter documents.  Arrival and Wait Times: [x]   Arriving on time ensures that everyone receives prompt attention. [x]   Early morning (8a) and afternoon (1p) appointments tend to have shortest wait times. [x]   Unfortunately, we cannot delay appointments for late arrivals or hold slots during phone calls.  Getting Answers and Following Up [x]   Simple Questions & Concerns: For quick questions or basic follow-up after your visit, reach [place] at [phone] or MyChart messaging. [x]   Complex Concerns: If your concern is more complex, scheduling an appointment might be best. Discuss this with the staff to find the most suitable option. [x]   Lab & Imaging Results: We'll contact you directly if results are abnormal or you don't use MyChart. Most normal results will be on MyChart within 2-3 business days, with a review message from Dr. Jon Billings. Haven't heard back in 2 weeks? Need results sooner? Contact us at [phone]. [x]   Referrals: Our referral coordinator will manage specialist referrals. The specialist's office should contact you within 2 weeks to schedule an appointment. Call us if you haven't heard from them after 2 weeks.  Staying Connected [x]   MyChart: Activate your MyChart for the fastest way to access results and message [place]. See the last page of this paperwork for instructions on how to activate.  Bring to Your Next Appointment [x]   Medications: Please bring all your medication bottles to your next appointment to ensure we have an  accurate record of your prescriptions. [x]   Health Diaries: If you're monitoring any health conditions at home, keeping a diary of your readings can be very helpful for discussions at your next appointment.  Billing [x]   X-ray & Lab Orders: These are billed by separate companies. Contact the invoicing company directly for questions or concerns. [x]   Visit Charges: Discuss any billing inquiries with our administrative services team.  Your Satisfaction Matters [x]   Share Your Experience: We strive for your satisfaction! If you have any complaints, or preferably compliments, please let Dr. Jon Billings know directly or contact our Practice Administrators, Edwena Felty or [employer], by asking at the front desk.   Reviewing Your Records [x]   Review this early draft of your clinical encounter notes below and the final encounter summary tomorrow on MyChart after its been completed.  All orders placed so far are visible here: Seizure disorder (HCC) -     Valtoco 15 MG Dose; Place 1 Dose into the nose daily as needed.  Dispense: 2 each; Refill: 2 -     Ambulatory referral to Neurology  multiple sclerosis -     Valtoco 15 MG Dose; Place 1 Dose into the nose daily as needed.  Dispense: 2 each; Refill: 2  Breakthrough seizure (HCC) -     Valtoco 15 MG Dose; Place 1 Dose into the nose daily as needed.  Dispense: 2 each; Refill: 2 -     Ambulatory referral to Neurology  Seizures (HCC) -     Valtoco 15 MG Dose; Place 1 Dose into the nose daily as needed.  Dispense: 2  each; Refill: 2 -     Ambulatory referral to Neurology  Ataxia -     Ambulatory referral to Physical Therapy

## 2023-03-09 NOTE — Assessment & Plan Note (Signed)
Refer care to physical therapy

## 2023-03-15 ENCOUNTER — Ambulatory Visit: Payer: Medicare Other | Admitting: Physical Therapy

## 2023-03-16 ENCOUNTER — Other Ambulatory Visit: Payer: Self-pay | Admitting: Internal Medicine

## 2023-03-21 ENCOUNTER — Ambulatory Visit: Payer: Medicare Other | Admitting: Physical Therapy

## 2023-03-21 ENCOUNTER — Encounter: Payer: Self-pay | Admitting: Physical Therapy

## 2023-03-21 DIAGNOSIS — R2681 Unsteadiness on feet: Secondary | ICD-10-CM

## 2023-03-21 DIAGNOSIS — R2689 Other abnormalities of gait and mobility: Secondary | ICD-10-CM

## 2023-03-21 DIAGNOSIS — M6281 Muscle weakness (generalized): Secondary | ICD-10-CM

## 2023-03-21 NOTE — Therapy (Signed)
 OUTPATIENT PHYSICAL THERAPY NEURO TREATMENT NOTE   Patient Name: Miguel Black MRN: [id number] DOB:1990/04/28, 33 y.o., male Today's Date: 03/21/2023   PCP: Glenetta Hew, MD REFERRING PROVIDER: Scheryl Marten. Rhona Leavens, MD>Dr. Epimenio Foot (f/u in March 2025)  END OF SESSION:  PT End of Session - 03/21/23 1404     Visit Number 2    Number of Visits 12    Date for PT Re-Evaluation 05/05/23    Authorization Type Medicare/Medicaid of [place]    PT Start Time 1404    PT Stop Time 1445    PT Time Calculation (min) 41 min    Equipment Utilized During Treatment Gait belt    Activity Tolerance Patient tolerated treatment well    Behavior During Therapy [facility] for tasks assessed/performed   Pt does have to be redirected to task at hand multiple times during therapy, to redirect to therapy-related conversations            Past Medical History:  Diagnosis Date   Acute encephalopathy 04/26/2022   Acute metabolic encephalopathy 04/26/2022   Acute respiratory failure with hypoxia (HCC) 08/23/2021   Aspiration pneumonia (HCC) 07/12/2018   Closed fracture of distal phalanx or phalanges of hand 02/05/2009   Formatting of this note might be different from the original. Overview: The original diagnosis was replaced automatically by the ICD-10 team to an ICD-10 compliant one. Please review for accuracy   Encounter for screening for HIV 07/12/2018   Foot drop 06/03/2022   Has a left foot drop ever since 2015 first flare of multiple sclerosis had a brace in 2022 custom but does not actually do anything to lift the from the foot physical therapist advised him to get an alternative foot drop brace but I cannot find record of that   Marijuana abuse 05/01/2013   MS (multiple sclerosis) (HCC)    Seizure (HCC)    Seizures (HCC) 01/22/2019   Status epilepticus (HCC) 07/12/2018   Past Surgical History:  Procedure Laterality Date   NO PAST SURGERIES     Patient Active Problem List   Diagnosis Date Noted    Seizures (HCC) 11/12/2022   Breakthrough seizure (HCC) 10/07/2022   Leukocytosis 10/07/2022   Hypocalcemia 10/07/2022   Elevated AST (SGOT) 10/07/2022   Status epilepticus (HCC) 06/13/2022   Ambulates with cane 06/04/2022   Ataxia 06/04/2022   Foot drop 06/03/2022   Vitamin D deficiency 06/03/2022   Smokes cigars 06/03/2022   Neurological deficit present 04/26/2022   History of optic neuritis 07/29/2020   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  Been seeing commercials for seated leg bikes and we may get one for home.  Pt accompanied by: self  PERTINENT HISTORY: See above; MS, hx of seizures  PAIN:  Are you having pain? No  PRECAUTIONS: Fall and Other: wears AFO LLE  RED FLAGS: None   WEIGHT BEARING RESTRICTIONS: No  FALLS: Has patient fallen in last 6 months? No  LIVING ENVIRONMENT: Lives with: lives with their family Lives in: House/apartment Stairs: Inside and outside steps with rails Has following equipment at home: Single point cane  PLOF: Independent, Independent with household mobility with device, and Independent with community mobility with device  PATIENT  GOALS: To work on balance and strength on L side  OBJECTIVE:    TODAY'S TREATMENT: 03/21/2023 Activity Comments  Sit to stand from elevated mat, 2 x10 reps Cues for foot placement, forward lean to lessen compensation of LLE push against mat  Minisquats 2 x 5 reps   Seated hamstring strengthening, 8 reps 5# at weight rack, PT assist for eccentric control, and also for concentric phase as he fatigues  Hooklying march 2 x 10   Bridging 3 x 5 reps Cues for control   Seated gastroc stretch, 3 x 15 sec, LLE Strap, cues to lessen pull into inversion   HOME EXERCISE PROGRAM: Access Code: [email or web address] URL: [email or web address] Date: 03/21/2023 Prepared by: [facility] - Outpatient  Rehab - [facility]  Exercises - Mini Squat with Counter Support  - 1 x daily - 5 x weekly - 3 sets - 5 reps - Sit to Stand  - 1 x daily - 5 x weekly - 3 sets - 5 reps - Supine March  - 1 x daily - 5 x weekly - 2 sets - 10 reps - Supine Bridge  - 1 x daily - 5 x weekly - 2-3 sets - 5 reps  PATIENT EDUCATION: Education details: HEP updates Person educated: Patient Education method: Explanation, Demonstration, and Handouts Education comprehension: verbalized understanding, returned demonstration, and needs further education   Note: Objective measures were completed at Evaluation unless otherwise noted.  DIAGNOSTIC FINDINGS: MRI 02/14/2023:  IMPRESSION: 1. No acute intracranial abnormality by CT. Chronic multiple sclerosis.  COGNITION: Overall cognitive status:  noted in chart-encephalopathy and ARF with hypoxia; hx of depression and suicidal ideation; needs to be re-directed in PT eval multiple times, due to conversation not appropriate to therapy in nature     LOWER EXTREMITY MMT:    MMT Right Eval Left Eval  Hip flexion 5 4  Hip extension    Hip abduction 4 4  Hip adduction 4 4  Hip internal rotation    Hip external rotation    Knee flexion 4+ 4  Knee extension 4+ 4  Ankle  dorsiflexion    Ankle plantarflexion    Ankle inversion    Ankle eversion    (Blank rows = not tested)  TRANSFERS: Assistive device utilized: None  Sit to stand: Modified independence Stand to sit: Modified independence and pt tends to leave LLE far in front and does not fully turn to place LLE under trunk when preparing to sit from gait activities.  GAIT: Gait pattern: step through pattern, decreased stance time- Left, genu recurvatum- Left, ataxic, and wide BOS Distance walked: 50 ft Assistive device utilized: Single point cane and with small rubber quad tip base Level of assistance: SBA Comments: unsteady, extra time with gait  FUNCTIONAL TESTS:  5 times sit to  stand: 27.66 sec  Timed up and go (TUG): 27.53 sec 10 meter walk test: 30.72 sec = 1.06 ft/sec Berg Balance Scale: 34/56                                                                                                                                  TREATMENT DATE: 03/07/2023    PATIENT EDUCATION: Education details: Eval results, POC Person educated: Patient Education method: Explanation Education comprehension: verbalized understanding  HOME EXERCISE PROGRAM: Not yet initiated  GOALS: Goals reviewed with patient? Yes  SHORT TERM GOALS: Target date: 04/07/2023  Pt will be independent with HEP for improved balance, strength, gait. Baseline: Goal status: INITIAL  2.  Pt will improve 5x sit<>stand to less than or equal to 20 sec to demonstrate improved functional strength and transfer efficiency. Baseline: 27.66 sec Goal status: INITIAL   LONG TERM GOALS: Target date: 05/05/2023  Pt will be independent with HEP for improved balance, strength, gait. Baseline:  Goal status: INITIAL  2.  Pt will improve Berg score to at least 40/56 to decrease fall risk. Baseline: 34/56 Goal status: INITIAL  3.  Pt will improve gait velocity to at least 1.5 ft/sec for improved gait efficiency and safety. Baseline:  1.05 ft/sec Goal status: INITIAL  4.  Pt will verbalize plans for continued community fitness upon d/c from PT. Baseline:  Goal status: INITIAL   ASSESSMENT:  CLINICAL IMPRESSION: Pt presents today without new complaints. Skilled PT session focused on initiating HEP; pt reports he does most of these exercises already, but he needs cues throughout exercises for slowed pace, control with concentric and eccentric phases.  Pt needs brief rest breaks in session and does report fatigue by end of session in LLE.  He needs cues to slow pace of gait for foot clearance upon leaving clinic, as he did not wear his AFO today. Pt will continue to benefit from skilled PT towards goals for improved functional mobility and decreased fall risk.    OBJECTIVE IMPAIRMENTS: Abnormal gait, decreased balance, decreased mobility, difficulty walking, and decreased strength.   ACTIVITY LIMITATIONS: standing, squatting, transfers, and locomotion level  PARTICIPATION LIMITATIONS: driving, community activity, and community fitness  PERSONAL FACTORS: 3+ comorbidities: see PMH  are also affecting patient's functional outcome.   REHAB POTENTIAL: Good  CLINICAL DECISION MAKING: Evolving/moderate complexity  EVALUATION COMPLEXITY: Moderate  PLAN:  PT FREQUENCY: 1-2x/week  PT DURATION: 8 weeks  PLANNED INTERVENTIONS: 97110-Therapeutic exercises, 97530- Therapeutic activity, 97112- Neuromuscular re-education, 97535- Self Care, 16109- Manual therapy, [phone]- Gait training, Patient/Family education, Balance training, and DME instructions  PLAN FOR NEXT SESSION: Review HEP:  proper technique of sit to stand, safe minisquats/chair touch to promote LLE weightbearing, standing head motions/EO/EC; lower extremity strength   Gal Feldhaus W., PT 03/21/2023, 3:22 PM  [facility] at [facility] [address] [place], [place] [address] Phone # [phone] Fax # [phone]

## 2023-03-28 ENCOUNTER — Ambulatory Visit: Payer: Medicare Other | Attending: Internal Medicine | Admitting: Physical Therapy

## 2023-03-28 ENCOUNTER — Encounter: Payer: Self-pay | Admitting: Physical Therapy

## 2023-03-28 DIAGNOSIS — M6281 Muscle weakness (generalized): Secondary | ICD-10-CM | POA: Insufficient documentation

## 2023-03-28 DIAGNOSIS — R2681 Unsteadiness on feet: Secondary | ICD-10-CM | POA: Diagnosis present

## 2023-03-28 DIAGNOSIS — R262 Difficulty in walking, not elsewhere classified: Secondary | ICD-10-CM | POA: Insufficient documentation

## 2023-03-28 DIAGNOSIS — R2689 Other abnormalities of gait and mobility: Secondary | ICD-10-CM | POA: Diagnosis present

## 2023-03-28 NOTE — Therapy (Signed)
 OUTPATIENT PHYSICAL THERAPY NEURO TREATMENT NOTE   Patient Name: Miguel Black MRN: [id number] DOB:1990/01/30, 33 y.o., male Today's Date: 03/28/2023   PCP: Glenetta Hew, MD REFERRING PROVIDER: Scheryl Marten. Rhona Leavens, MD>Dr. Epimenio Foot (f/u in March 2025)  END OF SESSION:  PT End of Session - 03/28/23 1247     Visit Number 3    Number of Visits 12    Date for PT Re-Evaluation 05/05/23    Authorization Type Medicare/Medicaid of [place]    PT Start Time 1245   Pt arrives late   PT Stop Time 1317    PT Time Calculation (min) 32 min    Equipment Utilized During Treatment Gait belt    Activity Tolerance Patient tolerated treatment well    Behavior During Therapy WFL for tasks assessed/performed             Past Medical History:  Diagnosis Date   Acute encephalopathy 04/26/2022   Acute metabolic encephalopathy 04/26/2022   Acute respiratory failure with hypoxia (HCC) 08/23/2021   Aspiration pneumonia (HCC) 07/12/2018   Closed fracture of distal phalanx or phalanges of hand 02/05/2009   Formatting of this note might be different from the original. Overview: The original diagnosis was replaced automatically by the ICD-10 team to an ICD-10 compliant one. Please review for accuracy   Encounter for screening for HIV 07/12/2018   Foot drop 06/03/2022   Has a left foot drop ever since 2015 first flare of multiple sclerosis had a brace in 2022 custom but does not actually do anything to lift the from the foot physical therapist advised him to get an alternative foot drop brace but I cannot find record of that   Marijuana abuse 05/01/2013   MS (multiple sclerosis) (HCC)    Seizure (HCC)    Seizures (HCC) 01/22/2019   Status epilepticus (HCC) 07/12/2018   Past Surgical History:  Procedure Laterality Date   NO PAST SURGERIES     Patient Active Problem List   Diagnosis Date Noted   Seizures (HCC) 11/12/2022   Breakthrough seizure (HCC) 10/07/2022   Leukocytosis 10/07/2022   Hypocalcemia  10/07/2022   Elevated AST (SGOT) 10/07/2022   Status epilepticus (HCC) 06/13/2022   Ambulates with cane 06/04/2022   Ataxia 06/04/2022   Foot drop 06/03/2022   Vitamin D deficiency 06/03/2022   Smokes cigars 06/03/2022   Neurological deficit present 04/26/2022   History of optic neuritis 07/29/2020   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   High risk medication use 01/28/2020   Seizure disorder (HCC) 12/04/2019   Marijuana use 12/04/2019   Gait disturbance 01/22/2019   Left-sided weakness 01/22/2019   Other fatigue 01/22/2019   multiple sclerosis 07/12/2018   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    Pt accompanied by: self  PERTINENT HISTORY: See above; MS, hx of seizures  PAIN:  Are you having pain? No  PRECAUTIONS: Fall and Other: wears AFO LLE  RED FLAGS: None   WEIGHT BEARING RESTRICTIONS: No  FALLS: Has patient fallen in last 6 months? No  LIVING ENVIRONMENT: Lives with: lives with their family Lives in: House/apartment Stairs: Inside and outside steps with rails Has following equipment at home: Single point cane  PLOF: Independent, Independent with household mobility with device, and Independent with community mobility with device  PATIENT GOALS: To work on balance and strength on L side  OBJECTIVE:    TODAY'S TREATMENT: 03/28/2023 Activity Comments  Review of HEP  Good return demo,  improved eccentric control with stand>sit  Stagger stance sit to stand with LLE posterior, 2 x 5 reps For increased LLE weightbearing  Bridging 5 reps with green band around thighs For hip control  Hooklying marching 2 x 5 reps with green theraband at thigs Cues for eccentric LLE control  Standing gastroc stretch at counter, 2 reps RLE, 3 reps LLE (runner's stretch position), 30 seconds Cues for BUE and slowed transition for safety with this stretch; he performs slowly and safely       *Pt reports fatigue by end of session   HOME EXERCISE PROGRAM: Access Code: [email or web address] URL: [email or web address] Date: 03/28/2023 Prepared by: [facility] - Outpatient  Rehab - [facility]  Exercises - Mini Squat with Counter Support  - 1 x daily - 5 x weekly - 3 sets - 5 reps - Sit to Stand  - 1 x daily - 5 x weekly - 3 sets - 5 reps - Supine March  - 1 x daily - 5 x weekly - 2 sets - 10 reps-green theraband at thighs - Supine Bridge  - 1 x daily - 5 x weekly - 2-3 sets - 5 reps-green theraband at thighs - Sit to stand in stride stance  - 1 x daily - 5 x weekly - 3 sets - 5 reps - Standing Gastroc Stretch at Counter  - 1-2 x daily - 7 x weekly - 1 sets - 3 reps - 15-30 sec hold  PATIENT EDUCATION: Education details: HEP updates and cues for LLE eccentric control; addition of green theraband to bridging and hooklying march/rationale for this Person educated: Patient Education method: Explanation, Demonstration, and Handouts Education comprehension: verbalized understanding, returned demonstration, and needs further education   Note: Objective measures were completed at Evaluation unless otherwise noted.  DIAGNOSTIC FINDINGS: MRI 02/14/2023:  IMPRESSION: 1. No acute intracranial abnormality by CT. Chronic multiple sclerosis.  COGNITION: Overall cognitive status:  noted in chart-encephalopathy and ARF with hypoxia; hx of depression and suicidal ideation; needs to be re-directed in PT  eval multiple times, due to conversation not appropriate to therapy in nature     LOWER EXTREMITY MMT:    MMT Right Eval Left Eval  Hip flexion 5 4  Hip extension    Hip abduction 4 4  Hip adduction 4 4  Hip internal rotation    Hip external rotation    Knee flexion 4+ 4  Knee extension 4+ 4  Ankle dorsiflexion    Ankle plantarflexion    Ankle inversion    Ankle eversion    (Blank rows = not tested)  TRANSFERS: Assistive device utilized: None  Sit to stand: Modified independence Stand to sit:  Modified independence and pt tends to leave LLE far in front and does not fully turn to place LLE under trunk when preparing to sit from gait activities.  GAIT: Gait pattern: step through pattern, decreased stance time- Left, genu recurvatum- Left, ataxic, and wide BOS Distance walked: 50 ft Assistive device utilized: Single point cane and with small rubber quad tip base Level of assistance: SBA Comments: unsteady, extra time with gait  FUNCTIONAL TESTS:  5 times sit to stand: 27.66 sec  Timed up and go (TUG): 27.53 sec 10 meter walk test: 30.72 sec = 1.06 ft/sec Berg Balance Scale: 34/56                                                                                                                                  TREATMENT DATE: 03/07/2023    PATIENT EDUCATION: Education details: Eval results, POC Person educated: Patient Education method: Explanation Education comprehension: verbalized understanding  HOME EXERCISE PROGRAM: Not yet initiated  GOALS: Goals reviewed with patient? Yes  SHORT TERM GOALS: Target date: 04/07/2023  Pt will be independent with HEP for improved balance, strength, gait. Baseline: Goal status: INITIAL  2.  Pt will improve 5x sit<>stand to less than or equal to 20 sec to demonstrate improved functional strength and transfer efficiency. Baseline: 27.66 sec Goal status: INITIAL   LONG TERM GOALS: Target date: 05/05/2023  Pt will be  independent with HEP for improved balance, strength, gait. Baseline:  Goal status: INITIAL  2.  Pt will improve Berg score to at least 40/56 to decrease fall risk. Baseline: 34/56 Goal status: INITIAL  3.  Pt will improve gait velocity to at least 1.5 ft/sec for improved gait efficiency and safety. Baseline: 1.05 ft/sec Goal status: INITIAL  4.  Pt will verbalize plans for continued community fitness upon d/c from PT. Baseline:  Goal status: INITIAL   ASSESSMENT:  CLINICAL IMPRESSION: Pt presents today with no new complaints.  He does report attending to slowed control of stand>sit and doing his exercises. Skilled PT session focused on review of and progression of HEP. Pt needs occasional cues for eccentric control with bridging and hooklying march; this is improved with addition of green theraband at hips. Also added standing (runner's) gastroc stretch at counter to HEP, as pt demo good safety with slowed transition into and out of position today, with good stretch noted.  Pt will continue to benefit from skilled PT towards goals for improved functional mobility and decreased fall risk.   OBJECTIVE IMPAIRMENTS: Abnormal gait, decreased balance, decreased mobility, difficulty walking, and decreased strength.   ACTIVITY LIMITATIONS: standing, squatting, transfers, and locomotion level  PARTICIPATION LIMITATIONS: driving, community activity, and community fitness  PERSONAL FACTORS: 3+ comorbidities: see PMH  are also affecting patient's functional outcome.   REHAB POTENTIAL: Good  CLINICAL DECISION MAKING: Evolving/moderate complexity  EVALUATION COMPLEXITY: Moderate  PLAN:  PT FREQUENCY: 1-2x/week  PT DURATION: 8 weeks  PLANNED INTERVENTIONS:  97110-Therapeutic exercises, 97530- Therapeutic activity, [id number]- Neuromuscular re-education, 97535- Self Care, 16109- Manual therapy, [phone]- Gait training, Patient/Family education, Balance training, and DME instructions  PLAN FOR NEXT  SESSION: Review HEP updates.  Work on squat touch>stand, LLE NMR and strength, standing head motions/EO/EC; lower extremity strength   Crystle Carelli W., PT 03/28/2023, 1:21 PM  [facility] at [facility] [address] [place], [address] [place], [place] [address] Phone # [phone] Fax # [phone]

## 2023-04-03 ENCOUNTER — Encounter: Payer: Self-pay | Admitting: Physical Therapy

## 2023-04-03 ENCOUNTER — Ambulatory Visit: Payer: Medicare (Managed Care) | Admitting: Neurology

## 2023-04-03 ENCOUNTER — Ambulatory Visit: Admitting: Physical Therapy

## 2023-04-03 DIAGNOSIS — M6281 Muscle weakness (generalized): Secondary | ICD-10-CM

## 2023-04-03 DIAGNOSIS — R2681 Unsteadiness on feet: Secondary | ICD-10-CM

## 2023-04-03 DIAGNOSIS — R2689 Other abnormalities of gait and mobility: Secondary | ICD-10-CM

## 2023-04-03 DIAGNOSIS — R262 Difficulty in walking, not elsewhere classified: Secondary | ICD-10-CM

## 2023-04-03 NOTE — Therapy (Signed)
 OUTPATIENT PHYSICAL THERAPY NEURO TREATMENT NOTE   Patient Name: Miguel Black MRN: [id number] DOB:1990-11-27, 33 y.o., male Today's Date: 04/03/2023   PCP: Glenetta Hew, MD REFERRING PROVIDER: Scheryl Marten. Rhona Leavens, MD>Dr. Epimenio Foot (f/u in March 2025)  END OF SESSION:  PT End of Session - 04/03/23 0936     Visit Number 4    Number of Visits 12    Date for PT Re-Evaluation 05/05/23    Authorization Type Medicare/Medicaid of [place]    PT Start Time [phone]    PT Stop Time 1014    PT Time Calculation (min) 38 min    Equipment Utilized During Treatment Gait belt    Activity Tolerance Patient tolerated treatment well    Behavior During Therapy WFL for tasks assessed/performed             Past Medical History:  Diagnosis Date   Acute encephalopathy 04/26/2022   Acute metabolic encephalopathy 04/26/2022   Acute respiratory failure with hypoxia (HCC) 08/23/2021   Aspiration pneumonia (HCC) 07/12/2018   Closed fracture of distal phalanx or phalanges of hand 02/05/2009   Formatting of this note might be different from the original. Overview: The original diagnosis was replaced automatically by the ICD-10 team to an ICD-10 compliant one. Please review for accuracy   Encounter for screening for HIV 07/12/2018   Foot drop 06/03/2022   Has a left foot drop ever since 2015 first flare of multiple sclerosis had a brace in 2022 custom but does not actually do anything to lift the from the foot physical therapist advised him to get an alternative foot drop brace but I cannot find record of that   Marijuana abuse 05/01/2013   MS (multiple sclerosis) (HCC)    Seizure (HCC)    Seizures (HCC) 01/22/2019   Status epilepticus (HCC) 07/12/2018   Past Surgical History:  Procedure Laterality Date   NO PAST SURGERIES     Patient Active Problem List   Diagnosis Date Noted   Seizures (HCC) 11/12/2022   Breakthrough seizure (HCC) 10/07/2022   Leukocytosis 10/07/2022   Hypocalcemia 10/07/2022    Elevated AST (SGOT) 10/07/2022   Status epilepticus (HCC) 06/13/2022   Ambulates with cane 06/04/2022   Ataxia 06/04/2022   Foot drop 06/03/2022   Vitamin D deficiency 06/03/2022   Smokes cigars 06/03/2022   Neurological deficit present 04/26/2022   History of optic neuritis 07/29/2020   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   High risk medication use 01/28/2020   Seizure disorder (HCC) 12/04/2019   Marijuana use 12/04/2019   Gait disturbance 01/22/2019   Left-sided weakness 01/22/2019   Other fatigue 01/22/2019   multiple sclerosis 07/12/2018   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  No falls.      Pt accompanied by: self  PERTINENT HISTORY: See above; MS, hx of seizures  PAIN:  Are you having pain? No  PRECAUTIONS: Fall and Other: wears AFO LLE  RED FLAGS: None   WEIGHT BEARING RESTRICTIONS: No  FALLS: Has patient fallen in last 6 months? No  LIVING ENVIRONMENT: Lives with: lives with their family Lives in: House/apartment Stairs: Inside and outside steps with rails Has following equipment at home: Single point cane  PLOF: Independent, Independent with household mobility with device, and Independent with community mobility with device  PATIENT GOALS: To work on balance and strength on L side  OBJECTIVE:    TODAY'S TREATMENT: 04/03/2023 Activity Comments  Review of HEP  Good return demo  Standing feet apart EC >feet together EO/EC, 30 sec Mild sway, no UE support  Standing EO/EC head turns/head nods x 5, feet apart>narrowed BOS 1 UE support  FTSTS:  15 sec  Improved from 27.66 sec  Tandem stance (one foot ahead but slightly wider), 2 x 15 sec 1 UE support  SLS 2 x 10 sec  1 UE support   HOME EXERCISE PROGRAM: Access Code: [email or web address] URL: [email or web address] Date: 04/03/2023 Prepared by: [facility] - Outpatient  Rehab - [facility]  Exercises - Mini Squat with Counter Support  - 1 x daily - 5 x weekly - 3 sets - 5 reps - Sit to Stand  - 1 x daily - 5 x weekly - 3 sets - 5 reps - Supine March  - 1 x daily - 5 x weekly - 2 sets - 10 reps-green theraband - Supine Bridge  - 1 x daily - 5 x weekly - 2-3 sets - 5 reps-green theraband - Sit to stand in stride stance  - 1 x daily - 5 x weekly - 3 sets - 5 reps - Standing Gastroc Stretch at Counter  - 1-2 x daily - 7 x weekly - 1 sets - 3 reps - 15-30 sec hold - Standing Tandem Balance with Counter Support  - 1 x daily - 5 x weekly - 1 sets - 3 reps - 15 sec hold - Single Leg Stance with Support  - 1 x daily - 5 x weekly - 1 sets - 3 reps - 10 sec hold   *Pt reports fatigue by end of session    PATIENT EDUCATION: Education details: HEP updates and progress towards goals Person educated: Patient Education method: Explanation, Demonstration, and Handouts Education comprehension: verbalized understanding, returned demonstration, and needs further education   Note: Objective measures were completed at Evaluation unless otherwise noted.  DIAGNOSTIC FINDINGS: MRI 02/14/2023:  IMPRESSION: 1. No acute intracranial abnormality by CT. Chronic multiple sclerosis.  COGNITION: Overall cognitive status:  noted in chart-encephalopathy and ARF with hypoxia; hx of depression and suicidal ideation; needs to be re-directed in PT eval multiple times, due to conversation not appropriate to therapy in  nature     LOWER EXTREMITY MMT:    MMT Right Eval Left Eval  Hip flexion 5 4  Hip extension    Hip abduction 4 4  Hip adduction 4 4  Hip internal rotation    Hip external rotation    Knee flexion 4+ 4  Knee extension 4+ 4  Ankle dorsiflexion    Ankle plantarflexion    Ankle inversion    Ankle eversion    (Blank rows = not tested)  TRANSFERS: Assistive  device utilized: None  Sit to stand: Modified independence Stand to sit: Modified independence and pt tends to leave LLE far in front and does not fully turn to place LLE under trunk when preparing to sit from gait activities.  GAIT: Gait pattern: step through pattern, decreased stance time- Left, genu recurvatum- Left, ataxic, and wide BOS Distance walked: 50 ft Assistive device utilized: Single point cane and with small rubber quad tip base Level of assistance: SBA Comments: unsteady, extra time with gait  FUNCTIONAL TESTS:  5 times sit to stand: 27.66 sec  Timed up and go (TUG): 27.53 sec 10 meter walk test: 30.72 sec = 1.06 ft/sec Berg Balance Scale: 34/56                                                                                                                                  TREATMENT DATE: 03/07/2023    PATIENT EDUCATION: Education details: Eval results, POC Person educated: Patient Education method: Explanation Education comprehension: verbalized understanding  HOME EXERCISE PROGRAM: Not yet initiated  GOALS: Goals reviewed with patient? Yes  SHORT TERM GOALS: Target date: 04/07/2023  Pt will be independent with HEP for improved balance, strength, gait. Baseline: Goal status: MET, 04/03/2023  2.  Pt will improve 5x sit<>stand to less than or equal to 20 sec to demonstrate improved functional strength and transfer efficiency. Baseline: 27.66 sec> 15 seconds 04/03/2023 Goal status: MET, 04/03/2023   LONG TERM GOALS: Target date: 05/05/2023  Pt will be independent with HEP for improved  balance, strength, gait. Baseline:  Goal status: INITIAL  2.  Pt will improve Berg score to at least 40/56 to decrease fall risk. Baseline: 34/56 Goal status: INITIAL  3.  Pt will improve gait velocity to at least 1.5 ft/sec for improved gait efficiency and safety. Baseline: 1.05 ft/sec Goal status: INITIAL  4.  Pt will verbalize plans for continued community fitness upon d/c from PT. Baseline:  Goal status: INITIAL   ASSESSMENT:  CLINICAL IMPRESSION: Pt presents today with no new complaints. Skilled PT session focused on assessing goals and pt has met STG 1 for HEP and STG 2 for improved FTSTS from 27 sec to 15 seconds.  Also worked on [employer] activities, with definite challenge noted with tandem stance and SLS. Pt needs 1 UE support for these exercises; he feels comfortable adding them to HEP, with UE support at counter for safety. Pt will continue to benefit from skilled PT towards goals for improved functional mobility and decreased fall risk.   OBJECTIVE IMPAIRMENTS: Abnormal gait, decreased balance, decreased mobility, difficulty walking, and decreased strength.   ACTIVITY LIMITATIONS: standing, squatting, transfers, and locomotion level  PARTICIPATION LIMITATIONS: driving, community activity, and community fitness  PERSONAL FACTORS: 3+ comorbidities: see PMH  are also affecting patient's functional outcome.   REHAB POTENTIAL: Good  CLINICAL DECISION MAKING: Evolving/moderate complexity  EVALUATION COMPLEXITY: Moderate  PLAN:  PT FREQUENCY: 1-2x/week  PT  DURATION: 8 weeks  PLANNED INTERVENTIONS: 97110-Therapeutic exercises, 97530- Therapeutic activity, [id number]- Neuromuscular re-education, 97535- Self Care, [id number]- Manual therapy, [phone]- Gait training, Patient/Family education, Balance training, and DME instructions  PLAN FOR NEXT SESSION: Review HEP updates.  Work on squat touch>stand, LLE NMR and strength, standing head motions/EO/EC; lower extremity  strength   Ginelle Bays W., PT 04/03/2023, 10:15 AM  [facility] at [facility] [address] [place], [address] [place], [place] [address] Phone # [phone] Fax # [phone]

## 2023-04-10 ENCOUNTER — Ambulatory Visit: Admitting: Physical Therapy

## 2023-04-12 ENCOUNTER — Ambulatory Visit: Admitting: Physical Therapy

## 2023-04-12 ENCOUNTER — Encounter: Payer: Self-pay | Admitting: Physical Therapy

## 2023-04-12 DIAGNOSIS — M6281 Muscle weakness (generalized): Secondary | ICD-10-CM

## 2023-04-12 DIAGNOSIS — R2689 Other abnormalities of gait and mobility: Secondary | ICD-10-CM | POA: Diagnosis not present

## 2023-04-12 DIAGNOSIS — R2681 Unsteadiness on feet: Secondary | ICD-10-CM

## 2023-04-12 NOTE — Therapy (Signed)
 OUTPATIENT PHYSICAL THERAPY NEURO TREATMENT NOTE   Patient Name: Miguel Black MRN: [id number] DOB:1990-04-26, 33 y.o., male Today's Date: 04/12/2023   PCP: Glenetta Hew, MD REFERRING PROVIDER: Scheryl Marten. Rhona Leavens, MD>Dr. Epimenio Foot (f/u in March 2025)  END OF SESSION:  PT End of Session - 04/12/23 1320     Visit Number 5    Number of Visits 12    Date for PT Re-Evaluation 05/05/23    Authorization Type Medicare/Medicaid of [place]    PT Start Time 1320    PT Stop Time 1400    PT Time Calculation (min) 40 min    Equipment Utilized During Treatment Gait belt    Activity Tolerance Patient tolerated treatment well    Behavior During Therapy WFL for tasks assessed/performed              Past Medical History:  Diagnosis Date   Acute encephalopathy 04/26/2022   Acute metabolic encephalopathy 04/26/2022   Acute respiratory failure with hypoxia (HCC) 08/23/2021   Aspiration pneumonia (HCC) 07/12/2018   Closed fracture of distal phalanx or phalanges of hand 02/05/2009   Formatting of this note might be different from the original. Overview: The original diagnosis was replaced automatically by the ICD-10 team to an ICD-10 compliant one. Please review for accuracy   Encounter for screening for HIV 07/12/2018   Foot drop 06/03/2022   Has a left foot drop ever since 2015 first flare of multiple sclerosis had a brace in 2022 custom but does not actually do anything to lift the from the foot physical therapist advised him to get an alternative foot drop brace but I cannot find record of that   Marijuana abuse 05/01/2013   MS (multiple sclerosis) (HCC)    Seizure (HCC)    Seizures (HCC) 01/22/2019   Status epilepticus (HCC) 07/12/2018   Past Surgical History:  Procedure Laterality Date   NO PAST SURGERIES     Patient Active Problem List   Diagnosis Date Noted   Seizures (HCC) 11/12/2022   Breakthrough seizure (HCC) 10/07/2022   Leukocytosis 10/07/2022   Hypocalcemia 10/07/2022    Elevated AST (SGOT) 10/07/2022   Status epilepticus (HCC) 06/13/2022   Ambulates with cane 06/04/2022   Ataxia 06/04/2022   Foot drop 06/03/2022   Vitamin D deficiency 06/03/2022   Smokes cigars 06/03/2022   Neurological deficit present 04/26/2022   History of optic neuritis 07/29/2020   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  Been doing a lot of exercise, so I'm tired.  Not wearing the brace, because it feels heavier.      Pt accompanied by: self  PERTINENT HISTORY: See above; MS, hx of seizures  PAIN:  Are you having pain? No  PRECAUTIONS: Fall and Other: wears AFO LLE  RED FLAGS: None   WEIGHT BEARING RESTRICTIONS: No  FALLS: Has patient fallen in last 6 months? No  LIVING ENVIRONMENT: Lives with: lives with their family Lives in: House/apartment Stairs: Inside and outside steps with rails Has following equipment at home: Single point cane  PLOF: Independent, Independent with household mobility with device, and Independent with community mobility with device  PATIENT GOALS: To work on balance and strength on L side  OBJECTIVE:    TODAY'S TREATMENT:  04/12/2023 Activity Comments  Sit to stand 2 x 5 reps Cues for L foot placement  Reviewed balance ex given last visit Cues for foot position and UE support for stability  Standing EO/EC head turns/head nods x 5, feet apart>narrowed BOS 1 UE support  Squat touch to elevated mat, 2 x 5 reps BUE support at chair  Forward lunge, LLE 5 reps BUE support at chair  Seated LAQ 8 reps Seated hamstring curls against red therapy ball, 8 reps 3# , 2 sets      HOME EXERCISE PROGRAM: Access Code: [email or web address] URL: [email or web address] Date: 04/03/2023 Prepared by: [facility] - Outpatient  Rehab - [facility]  Exercises - Mini Squat with Counter Support  - 1 x daily - 5 x weekly - 3 sets - 5 reps - Sit to Stand  - 1 x daily - 5 x weekly - 3 sets - 5 reps - Supine March  - 1 x daily - 5 x weekly - 2 sets - 10 reps-green theraband - Supine Bridge  - 1 x daily - 5 x weekly - 2-3 sets - 5 reps-green theraband - Sit to stand in stride stance  - 1 x daily - 5 x weekly - 3 sets - 5 reps - Standing Gastroc Stretch at Counter  - 1-2 x daily - 7 x weekly - 1 sets - 3 reps - 15-30 sec hold - Standing Tandem Balance with Counter Support  - 1 x daily - 5 x weekly - 1 sets - 3 reps - 15 sec hold - Single Leg Stance with Support  - 1 x daily - 5 x weekly - 1 sets - 3 reps - 10 sec hold   *Pt reports fatigue by end of session    PATIENT EDUCATION: Education details: Continue current HEP Person educated: Patient Education method: Explanation, Demonstration, and Handouts Education comprehension: verbalized understanding, returned demonstration, and needs further education   Note: Objective measures were completed at Evaluation unless otherwise noted.  DIAGNOSTIC FINDINGS: MRI 02/14/2023:  IMPRESSION: 1. No acute intracranial abnormality by CT. Chronic multiple sclerosis.  COGNITION: Overall cognitive status:  noted in chart-encephalopathy and ARF with hypoxia; hx of depression and suicidal  ideation; needs to be re-directed in PT eval multiple times, due to conversation not appropriate to therapy in nature     LOWER EXTREMITY MMT:    MMT Right Eval Left Eval  Hip flexion 5 4  Hip extension    Hip abduction 4 4  Hip adduction 4 4  Hip internal rotation    Hip external rotation    Knee flexion 4+ 4  Knee extension 4+ 4  Ankle dorsiflexion    Ankle plantarflexion    Ankle inversion    Ankle eversion    (Blank rows = not tested)  TRANSFERS: Assistive device utilized: None  Sit to stand: Modified independence Stand to sit: Modified independence and pt tends to leave LLE far in front and does not fully turn to place LLE under trunk when preparing to sit from gait activities.  GAIT: Gait pattern: step through pattern, decreased stance time- Left, genu recurvatum- Left, ataxic, and wide BOS Distance walked: 50 ft Assistive device utilized: Single point cane and with small rubber quad tip base Level of assistance: SBA Comments: unsteady, extra time with gait  FUNCTIONAL TESTS:  5 times sit to stand: 27.66 sec  Timed up and go (TUG): 27.53 sec 10 meter walk test: 30.72 sec = 1.06 ft/sec Berg Balance Scale: 34/56                                                                                                                                  TREATMENT DATE: 03/07/2023    PATIENT EDUCATION: Education details: Eval results, POC Person educated: Patient Education method: Explanation Education comprehension: verbalized understanding  HOME EXERCISE PROGRAM: Not yet initiated  GOALS: Goals reviewed with patient? Yes  SHORT TERM GOALS: Target date: 04/07/2023  Pt will be independent with HEP for improved balance, strength, gait. Baseline: Goal status: MET, 04/03/2023  2.  Pt will improve 5x sit<>stand to less than or equal to 20 sec to demonstrate improved functional strength and transfer efficiency. Baseline: 27.66 sec> 15 seconds 04/03/2023 Goal status:  MET, 04/03/2023   LONG TERM GOALS: Target date: 05/05/2023  Pt will be independent with HEP for improved balance, strength, gait. Baseline:  Goal status: INITIAL  2.  Pt will improve Berg score to at least 40/56 to decrease fall risk. Baseline: 34/56 Goal status: INITIAL  3.  Pt will improve gait velocity to at least 1.5 ft/sec for improved gait efficiency and safety. Baseline: 1.05 ft/sec Goal status: INITIAL  4.  Pt will verbalize plans for continued community fitness upon d/c from PT. Baseline:  Goal status: INITIAL   ASSESSMENT:  CLINICAL IMPRESSION: Pt presents today with no new complaints, no falls. Skilled PT session focused on review of balance exercises and continued NMR/strength to LLE. Pt needs at least 1 UE support for balance exercises and BUE support for squat and lunge exercises.  He needs several brief breaks during PT session, but he does no report fatigue as he had in previous sessions.  Did educate pt that brace would be more helpful in LLE stability and foot clearance, as pt is not wearing today.  Pt will continue to benefit from skilled PT towards goals for improved functional mobility and decreased fall risk.   OBJECTIVE IMPAIRMENTS: Abnormal gait, decreased balance, decreased mobility, difficulty walking, and decreased strength.   ACTIVITY LIMITATIONS: standing, squatting, transfers, and locomotion level  PARTICIPATION LIMITATIONS: driving, community  activity, and community fitness  PERSONAL FACTORS: 3+ comorbidities: see PMH  are also affecting patient's functional outcome.   REHAB POTENTIAL: Good  CLINICAL DECISION MAKING: Evolving/moderate complexity  EVALUATION COMPLEXITY: Moderate  PLAN:  PT FREQUENCY: 1-2x/week  PT DURATION: 8 weeks  PLANNED INTERVENTIONS: 97110-Therapeutic exercises, 97530- Therapeutic activity, 97112- Neuromuscular re-education, 97535- Self Care, 91478- Manual therapy, [phone]- Gait training, Patient/Family education, Balance  training, and DME instructions  PLAN FOR NEXT SESSION: Continue to work on squat touch>stand, LLE NMR and strength, standing head motions/EO/EC; lower extremity strength and add any exercises to HEP as appropriate.   Gean Maidens., PT 04/12/2023, 4:21 PM  [facility] at [facility] [address] [place], [address] [place], [place] [address] Phone # [phone] Fax # [phone]

## 2023-04-17 ENCOUNTER — Ambulatory Visit: Admitting: Physical Therapy

## 2023-04-21 ENCOUNTER — Other Ambulatory Visit: Payer: Self-pay | Admitting: Internal Medicine

## 2023-04-24 ENCOUNTER — Inpatient Hospital Stay (HOSPITAL_COMMUNITY)
Admission: EM | Admit: 2023-04-24 | Discharge: 2023-05-01 | DRG: 100 | Disposition: A | Discharge status: Home / Self Care | Attending: Internal Medicine | Admitting: Internal Medicine

## 2023-04-24 ENCOUNTER — Emergency Department (HOSPITAL_COMMUNITY)

## 2023-04-24 ENCOUNTER — Encounter (HOSPITAL_COMMUNITY): Payer: Self-pay

## 2023-04-24 ENCOUNTER — Encounter: Payer: Self-pay | Admitting: Physical Therapy

## 2023-04-24 ENCOUNTER — Ambulatory Visit: Admitting: Physical Therapy

## 2023-04-24 DIAGNOSIS — K5900 Constipation, unspecified: Secondary | ICD-10-CM | POA: Diagnosis not present

## 2023-04-24 DIAGNOSIS — R2689 Other abnormalities of gait and mobility: Secondary | ICD-10-CM

## 2023-04-24 DIAGNOSIS — Z841 Family history of disorders of kidney and ureter: Secondary | ICD-10-CM

## 2023-04-24 DIAGNOSIS — Z91148 Patient's other noncompliance with medication regimen for other reason: Secondary | ICD-10-CM | POA: Diagnosis not present

## 2023-04-24 DIAGNOSIS — Z8261 Family history of arthritis: Secondary | ICD-10-CM

## 2023-04-24 DIAGNOSIS — Z800 Family history of malignant neoplasm of digestive organs: Secondary | ICD-10-CM

## 2023-04-24 DIAGNOSIS — Z807 Family history of other malignant neoplasms of lymphoid, hematopoietic and related tissues: Secondary | ICD-10-CM

## 2023-04-24 DIAGNOSIS — D72829 Elevated white blood cell count, unspecified: Secondary | ICD-10-CM | POA: Diagnosis not present

## 2023-04-24 DIAGNOSIS — F17290 Nicotine dependence, other tobacco product, uncomplicated: Secondary | ICD-10-CM | POA: Diagnosis present

## 2023-04-24 DIAGNOSIS — M21372 Foot drop, left foot: Secondary | ICD-10-CM | POA: Diagnosis present

## 2023-04-24 DIAGNOSIS — Z8701 Personal history of pneumonia (recurrent): Secondary | ICD-10-CM

## 2023-04-24 DIAGNOSIS — Z79899 Other long term (current) drug therapy: Secondary | ICD-10-CM

## 2023-04-24 DIAGNOSIS — Z6379 Other stressful life events affecting family and household: Secondary | ICD-10-CM

## 2023-04-24 DIAGNOSIS — L89152 Pressure ulcer of sacral region, stage 2: Secondary | ICD-10-CM | POA: Diagnosis present

## 2023-04-24 DIAGNOSIS — Z781 Physical restraint status: Secondary | ICD-10-CM

## 2023-04-24 DIAGNOSIS — Z8249 Family history of ischemic heart disease and other diseases of the circulatory system: Secondary | ICD-10-CM

## 2023-04-24 DIAGNOSIS — L89154 Pressure ulcer of sacral region, stage 4: Secondary | ICD-10-CM | POA: Diagnosis present

## 2023-04-24 DIAGNOSIS — L89153 Pressure ulcer of sacral region, stage 3: Secondary | ICD-10-CM | POA: Diagnosis present

## 2023-04-24 DIAGNOSIS — Z823 Family history of stroke: Secondary | ICD-10-CM

## 2023-04-24 DIAGNOSIS — R4701 Aphasia: Secondary | ICD-10-CM | POA: Diagnosis present

## 2023-04-24 DIAGNOSIS — R34 Anuria and oliguria: Secondary | ICD-10-CM | POA: Diagnosis not present

## 2023-04-24 DIAGNOSIS — R2681 Unsteadiness on feet: Secondary | ICD-10-CM

## 2023-04-24 DIAGNOSIS — E7220 Disorder of urea cycle metabolism, unspecified: Secondary | ICD-10-CM | POA: Diagnosis present

## 2023-04-24 DIAGNOSIS — E872 Acidosis, unspecified: Secondary | ICD-10-CM | POA: Diagnosis present

## 2023-04-24 DIAGNOSIS — G40901 Epilepsy, unspecified, not intractable, with status epilepticus: Principal | ICD-10-CM | POA: Diagnosis present

## 2023-04-24 DIAGNOSIS — R569 Unspecified convulsions: Secondary | ICD-10-CM

## 2023-04-24 DIAGNOSIS — T426X6A Underdosing of other antiepileptic and sedative-hypnotic drugs, initial encounter: Secondary | ICD-10-CM | POA: Diagnosis present

## 2023-04-24 DIAGNOSIS — R29810 Facial weakness: Secondary | ICD-10-CM | POA: Diagnosis present

## 2023-04-24 DIAGNOSIS — G35 Multiple sclerosis: Secondary | ICD-10-CM | POA: Diagnosis present

## 2023-04-24 DIAGNOSIS — G8191 Hemiplegia, unspecified affecting right dominant side: Secondary | ICD-10-CM | POA: Diagnosis present

## 2023-04-24 DIAGNOSIS — Z888 Allergy status to other drugs, medicaments and biological substances status: Secondary | ICD-10-CM

## 2023-04-24 DIAGNOSIS — L0231 Cutaneous abscess of buttock: Secondary | ICD-10-CM | POA: Diagnosis present

## 2023-04-24 DIAGNOSIS — Z1152 Encounter for screening for COVID-19: Secondary | ICD-10-CM

## 2023-04-24 DIAGNOSIS — M6281 Muscle weakness (generalized): Secondary | ICD-10-CM

## 2023-04-24 DIAGNOSIS — R270 Ataxia, unspecified: Secondary | ICD-10-CM

## 2023-04-24 DIAGNOSIS — L89313 Pressure ulcer of right buttock, stage 3: Secondary | ICD-10-CM | POA: Diagnosis present

## 2023-04-24 DIAGNOSIS — Z7960 Long term (current) use of unspecified immunomodulators and immunosuppressants: Secondary | ICD-10-CM

## 2023-04-24 DIAGNOSIS — N179 Acute kidney failure, unspecified: Secondary | ICD-10-CM | POA: Diagnosis not present

## 2023-04-24 DIAGNOSIS — D84821 Immunodeficiency due to drugs: Secondary | ICD-10-CM | POA: Diagnosis present

## 2023-04-24 DIAGNOSIS — Z833 Family history of diabetes mellitus: Secondary | ICD-10-CM

## 2023-04-24 DIAGNOSIS — H5500 Unspecified nystagmus: Secondary | ICD-10-CM | POA: Diagnosis present

## 2023-04-24 DIAGNOSIS — R339 Retention of urine, unspecified: Secondary | ICD-10-CM | POA: Diagnosis present

## 2023-04-24 DIAGNOSIS — L89312 Pressure ulcer of right buttock, stage 2: Secondary | ICD-10-CM | POA: Diagnosis present

## 2023-04-24 DIAGNOSIS — A419 Sepsis, unspecified organism: Principal | ICD-10-CM | POA: Diagnosis present

## 2023-04-24 LAB — CBC WITH DIFFERENTIAL/PLATELET
Abs Immature Granulocytes: 0.08 10*3/uL — ABNORMAL HIGH (ref 0.00–0.07)
Basophils Absolute: 0.1 10*3/uL (ref 0.0–0.1)
Basophils Relative: 1 %
Eosinophils Absolute: 0 10*3/uL (ref 0.0–0.5)
Eosinophils Relative: 0 %
HCT: 45.7 % (ref 39.0–52.0)
Hemoglobin: 14.6 g/dL (ref 13.0–17.0)
Immature Granulocytes: 1 %
Lymphocytes Relative: 20 %
Lymphs Abs: 2.2 10*3/uL (ref 0.7–4.0)
MCH: 32.1 pg (ref 26.0–34.0)
MCHC: 31.9 g/dL (ref 30.0–36.0)
MCV: 100.4 fL — ABNORMAL HIGH (ref 80.0–100.0)
Monocytes Absolute: 0.7 10*3/uL (ref 0.1–1.0)
Monocytes Relative: 7 %
Neutro Abs: 8.2 10*3/uL — ABNORMAL HIGH (ref 1.7–7.7)
Neutrophils Relative %: 71 %
Platelets: 372 10*3/uL (ref 150–400)
RBC: 4.55 MIL/uL (ref 4.22–5.81)
RDW: 14.9 % (ref 11.5–15.5)
WBC: 11.4 10*3/uL — ABNORMAL HIGH (ref 4.0–10.5)
nRBC: 0 % (ref 0.0–0.2)

## 2023-04-24 LAB — RESP PANEL BY RT-PCR (RSV, FLU A&B, COVID)  RVPGX2
Influenza A by PCR: NEGATIVE
Influenza B by PCR: NEGATIVE
Resp Syncytial Virus by PCR: NEGATIVE
SARS Coronavirus 2 by RT PCR: NEGATIVE

## 2023-04-24 LAB — VALPROIC ACID LEVEL: Valproic Acid Lvl: 16 ug/mL — ABNORMAL LOW (ref 50.0–100.0)

## 2023-04-24 LAB — COMPREHENSIVE METABOLIC PANEL WITH GFR
ALT: 14 U/L (ref 0–44)
AST: 17 U/L (ref 15–41)
Albumin: 3.8 g/dL (ref 3.5–5.0)
Alkaline Phosphatase: 48 U/L (ref 38–126)
Anion gap: 9 (ref 5–15)
BUN: 8 mg/dL (ref 6–20)
CO2: 20 mmol/L — ABNORMAL LOW (ref 22–32)
Calcium: 8 mg/dL — ABNORMAL LOW (ref 8.9–10.3)
Chloride: 112 mmol/L — ABNORMAL HIGH (ref 98–111)
Creatinine, Ser: 1.06 mg/dL (ref 0.61–1.24)
GFR, Estimated: >60 mL/min (ref >60)
Glucose, Bld: 122 mg/dL — ABNORMAL HIGH (ref 70–99)
Potassium: 4.3 mmol/L (ref 3.5–5.1)
Sodium: 141 mmol/L (ref 135–145)
Total Bilirubin: 0.5 mg/dL (ref 0.0–1.2)
Total Protein: 6.7 g/dL (ref 6.5–8.1)

## 2023-04-24 MED ORDER — SODIUM CHLORIDE 0.9 % IV SOLN
1000.0000 mg | Freq: Once | INTRAVENOUS | Status: AC
  Administered 2023-04-24: 1000 mg via INTRAVENOUS
  Filled 2023-04-24: qty 7.69

## 2023-04-24 MED ORDER — VALPROATE SODIUM 100 MG/ML IV SOLN
500.0000 mg | Freq: Once | INTRAVENOUS | Status: DC
  Filled 2023-04-24: qty 5

## 2023-04-24 MED ORDER — LORAZEPAM 2 MG/ML IJ SOLN
INTRAMUSCULAR | Status: AC
  Filled 2023-04-24: qty 1

## 2023-04-24 NOTE — Progress Notes (Signed)
 Primary review of EEG-generalized slowing.  No seizures.  -- Milon Dikes, MD Neurologist [facility]  Additional 10 min cc time

## 2023-04-24 NOTE — Consult Note (Signed)
 NEUROLOGY CONSULT NOTE   Date of service: April 24, 2023 Patient Name: Miguel Black MRN:  [id number] DOB:  10-28-1990 Chief Complaint: "Seizures" Requesting Provider: Glyn Ade, MD  History of Present Illness  Miguel Black is a 33 y.o. male with hx of multiple sclerosis on Ocrevus, complicated epilepsy with multiple admissions for breakthrough seizures due to noncompliance to medications presenting for evaluation from home via EMS where he had a seizure around 7 PM, given intranasal medication, seized again-EMS called and the seizure did stop.  En route, started having seizures again.  Got total of 10 mg of IM midazolam but continued to seize for about 15 minutes still after midazolam administration.  I was called on the phone-due to levels of valproate not being available at the time, I recommended a load with phenobarbital.  Seized again while getting phenobarb load.  Currently with nasal trumpet in place protecting airway but extremely difficult to arouse. Unable to provide any history  Valproate level came back 16.    ROS  Unable to ascertain due to his abnormal mentation  Past History   Past Medical History:  Diagnosis Date   Acute encephalopathy 04/26/2022   Acute metabolic encephalopathy 04/26/2022   Acute respiratory failure with hypoxia (HCC) 08/23/2021   Aspiration pneumonia (HCC) 07/12/2018   Closed fracture of distal phalanx or phalanges of hand 02/05/2009   Formatting of this note might be different from the original. Overview: The original diagnosis was replaced automatically by the ICD-10 team to an ICD-10 compliant one. Please review for accuracy   Encounter for screening for HIV 07/12/2018   Foot drop 06/03/2022   Has a left foot drop ever since 2015 first flare of multiple sclerosis had a brace in 2022 custom but does not actually do anything to lift the from the foot physical therapist advised him to get an alternative foot drop brace but I cannot  find record of that   Marijuana abuse 05/01/2013   MS (multiple sclerosis) (HCC)    Seizure (HCC)    Seizures (HCC) 01/22/2019   Status epilepticus (HCC) 07/12/2018    Past Surgical History:  Procedure Laterality Date   NO PAST SURGERIES      Family History: Family History  Problem Relation Age of Onset   Arthritis Mother    Hypertension Mother    Healthy Father    Healthy Sister    Healthy Brother    Hodgkin's lymphoma Brother    Colon cancer Maternal Uncle 45   Kidney disease Maternal Grandmother    Hypertension Maternal Grandmother    Arthritis Maternal Grandmother    Diabetes Maternal Grandmother    Stroke Maternal Grandfather    Hypertension Maternal Grandfather    Diabetes Paternal Grandmother     Social History  reports that he has been smoking cigars. He has never used smokeless tobacco. He reports current alcohol use. He reports current drug use. Drug: Marijuana.  Allergies  Allergen Reactions   Ampyra [Dalfampridine] Other (See Comments)    Seizure 02/2021 while on dalfampridine    Medications   Current Facility-Administered Medications:    PHENObarbital (LUMINAL) 1,000 mg in sodium chloride 0.9 % 100 mL IVPB, 1,000 mg, Intravenous, Once, [place]man, Chase, MD, Last Rate: 100 mL/hr at 04/24/23 2126, 1,000 mg at 04/24/23 2126  Current Outpatient Medications:    diazePAM, 15 MG Dose, (VALTOCO 15 MG DOSE) 2 x 7.5 MG/0.1ML LQPK, Place 1 Dose into the nose daily as needed., Disp: 2 each, Rfl: 2  divalproex (DEPAKOTE) 250 MG DR tablet, TAKE 1 TABLET BY MOUTH TWICE A DAY, Disp: 60 tablet, Rfl: 0   ocrelizumab (OCREVUS) 300 MG/10ML injection, Inject 20 mLs (600 mg total) into the vein every 6 (six) months., Disp: 20 mL, Rfl: 1   topiramate (TOPAMAX) 100 MG tablet, TAKE 1 TABLET BY MOUTH TWICE A DAY, Disp: 60 tablet, Rfl: 0  Vitals   Vitals:   2023/05/01 2039 05-01-23 2041 2023/05/01 2044 05/01/2023 2100  BP: 132/79   (!) 136/96  Pulse:  95 93 79  Resp:  (!) 21 20  19   SpO2:  100% 100% 98%    There is no height or weight on file to calculate BMI.  Physical Exam  General: Obtunded, no response to voice HEENT: Normocephalic atraumatic Lungs: Scattered rales CVS regular rate rhythm Abdomen nondistended nontender Extremities warm well-perfused Neurological examination Obtunded No response to voice To painful stimulation has some withdrawal in both upper and lower extremity and has some generalized shivering/seizure activity that lasted about a few seconds. Cranial nerves: Pupils are equal round reactive to light, has somewhat of a left gaze preference, face appears symmetric. Motor examination as above Sensation: As above  Labs/Imaging/Neurodiagnostic studies   CBC:  Recent Labs  Lab 2023/05/01 2052  WBC 11.4*  NEUTROABS 8.2*  HGB 14.6  HCT 45.7  MCV 100.4*  PLT 372   Basic Metabolic Panel:  Lab Results  Component Value Date   NA 141 May 01, 2023   K 4.3 05/01/2023   CO2 20 (L) May 01, 2023   GLUCOSE 122 (H) 05/01/2023   BUN 8 May 01, 2023   CREATININE 1.06 2023-05-01   CALCIUM 8.0 (L) 05/01/2023   GFRNONAA >60 05-01-2023   GFRAA 99 07/22/2019   Lipid Panel:  Lab Results  Component Value Date   LDLCALC 113 (H) 06/03/2022   HgbA1c:  Lab Results  Component Value Date   HGBA1C 5.5 08/22/2021   Urine Drug Screen:     Component Value Date/Time   LABOPIA NONE DETECTED 02/13/2023 1621   COCAINSCRNUR NONE DETECTED 02/13/2023 1621   LABBENZ POSITIVE (A) 02/13/2023 1621   AMPHETMU NONE DETECTED 02/13/2023 1621   THCU POSITIVE (A) 02/13/2023 1621   LABBARB NONE DETECTED 02/13/2023 1621    Alcohol Level     Component Value Date/Time   ETH <10 02/13/2023 1521   INR No results found for: "INR" APTT No results found for: "APTT" AED levels:  Lab Results  Component Value Date   LAMOTRIGINE <1.0 (L) 02/13/2023   LEVETIRACETA <2.0 (L) 06/13/2022  Valproate 16  CT Head without contrast(Personally reviewed): Other than a question  of artifact in the left occipital area (common area for artifact, less likely subacute stroke) - no acute abnormality.   ASSESSMENT   Miguel Black is a 33 y.o. male past medical history of MS and seizures with noncompliance to medications coming in with multiple breakthrough seizures without return to baseline-technically status epilepticus.  Impression: Status epilepticus due to breakthrough seizures in the setting of noncompliance with seizure medications  RECOMMENDATIONS  Phenobarbital load 20 mg/kg x 1. Valproate level 16-likely due to noncompliance. Prior ammonia elevations on valproate-will avoid valproate for now After the phenobarbital load, if he still is seizing, will consider load with fosphenytoin. Other alternatives to consider will be Vimpat. cEEG to be hooked up overnight. Start Phenobarb 65 mg BID Seizure precautions MR brain at some point once seizures are sorted out. Plan discussed with Dr. Doran Durand ______________________________________________________________________    Signed, Milon Dikes, MD [facility]  CRITICAL CARE ATTESTATION Performed by: Milon Dikes, MD Total critical care time: 40 minutes Critical care time was exclusive of separately billable procedures and treating other patients and/or supervising APPs/Residents/Students Critical care was necessary to treat or prevent imminent or life-threatening deterioration. This patient is critically ill and at significant risk for neurological worsening and/or death and care requires constant monitoring. Critical care was time spent personally by me on the following activities: development of treatment plan with patient and/or surrogate as well as nursing, discussions with consultants, evaluation of patient's response to treatment, examination of patient, obtaining history from patient or surrogate, ordering and performing treatments and interventions, ordering and review of laboratory  studies, ordering and review of radiographic studies, pulse oximetry, re-evaluation of patient's condition, participation in multidisciplinary rounds and medical decision making of high complexity in the care of this patient.

## 2023-04-24 NOTE — ED Provider Notes (Signed)
 [place] EMERGENCY DEPARTMENT AT [facility] Provider Note   CSN: [id number] Arrival date & time: 04/24/23  2039     History  Chief Complaint  Patient presents with   Seizures    Miguel Black is a 33 y.o. male with hx MS, seizures who presents with status epilepticus.  2 seizures reported at home.  Intranasal diazepam administered by family.  EMS arrived and administered 2 x 5 mg IM midazolam.  Patient stopped seizing in between these 2 administrations.  Seizures    Home Medications Prior to Admission medications   Medication Sig Start Date End Date Taking? Authorizing Provider  diazePAM, 15 MG Dose, (VALTOCO 15 MG DOSE) 2 x 7.5 MG/0.1ML LQPK Place 1 Dose into the nose daily as needed. 03/09/23   Lula Olszewski, MD  divalproex (DEPAKOTE) 250 MG DR tablet TAKE 1 TABLET BY MOUTH TWICE A DAY 04/22/23   Lula Olszewski, MD  ocrelizumab (OCREVUS) 300 MG/10ML injection Inject 20 mLs (600 mg total) into the vein every 6 (six) months. 10/26/22   Sater, Pearletha Furl, MD  topiramate (TOPAMAX) 100 MG tablet TAKE 1 TABLET BY MOUTH TWICE A DAY 04/22/23   Lula Olszewski, MD      Allergies    Ampyra [dalfampridine]    Review of Systems   Review of Systems  Neurological:  Positive for seizures.    Physical Exam Updated Vital Signs BP 129/86   Pulse 92   Resp 17   SpO2 100%  Physical Exam Vitals and nursing note reviewed.  Constitutional:      Comments: Getting back on arrival, however patient has spontaneous respirations.  Placed on nonrebreather with appropriate oxygenation  HENT:     Head: Normocephalic and atraumatic.  Eyes:     Pupils: Pupils are equal, round, and reactive to light.     Comments: No gaze deviation  Cardiovascular:     Rate and Rhythm: Normal rate and regular rhythm.  Pulmonary:     Breath sounds: Examination of the right-upper field reveals decreased breath sounds. Examination of the left-upper field reveals rhonchi. Examination of the  right-middle field reveals decreased breath sounds. Examination of the left-middle field reveals rhonchi. Examination of the right-lower field reveals decreased breath sounds. Examination of the left-lower field reveals rhonchi. Decreased breath sounds and rhonchi present.     Comments: soniferous respirations while lying flat Abdominal:     General: Abdomen is flat.     Palpations: Abdomen is soft.  Skin:    General: Skin is warm and dry.     Capillary Refill: Capillary refill takes less than 2 seconds.     ED Results / Procedures / Treatments   Labs (all labs ordered are listed, but only abnormal results are displayed) Labs Reviewed  COMPREHENSIVE METABOLIC PANEL WITH GFR - Abnormal; Notable for the following components:      Result Value   Chloride 112 (*)    CO2 20 (*)    Glucose, Bld 122 (*)    Calcium 8.0 (*)    All other components within normal limits  CBC WITH DIFFERENTIAL/PLATELET - Abnormal; Notable for the following components:   WBC 11.4 (*)    MCV 100.4 (*)    Neutro Abs 8.2 (*)    Abs Immature Granulocytes 0.08 (*)    All other components within normal limits  VALPROIC ACID LEVEL - Abnormal; Notable for the following components:   Valproic Acid Lvl 16 (*)    All other  components within normal limits  RESP PANEL BY RT-PCR (RSV, FLU A&B, COVID)  [id number]   EKG None  Radiology CT HEAD WO CONTRAST ( ) Result Date: 04/24/2023 CLINICAL DATA:  Altered mental status EXAM: CT HEAD WITHOUT CONTRAST TECHNIQUE: Contiguous axial images were obtained from the base of the skull through the vertex without intravenous contrast. RADIATION DOSE REDUCTION: This exam was performed according to the departmental dose-optimization program which includes automated exposure control, adjustment of the mA and/or kV according to patient size and/or use of iterative reconstruction technique. COMPARISON:  02/14/2023 FINDINGS: Brain: Normal anatomic configuration of the brain. Periventricular  white matter changes are again identified in keeping with the known diagnosis of multiple sclerosis, most severe within the left periatrial region. There is hypoattenuation of the inferior left occipital pole which may be artifactual and related to beam hardening, however, an acute to subacute infarct in this region is difficult to exclude. No superimposed acute intracranial hemorrhage. No abnormal mass effect or midline shift. Ventricular size is normal. Cerebellum is unremarkable. Vascular: No hyperdense vessel or unexpected calcification. Skull: Normal. Negative for fracture or focal lesion. Sinuses/Orbits: No acute finding. Other: Mastoid air cells and middle ear cavities are clear. IMPRESSION: 1. Periventricular white matter changes in keeping with the known diagnosis of multiple sclerosis, most severe within the left periatrial region. 2. Hypoattenuation of the inferior left occipital pole which may be artifactual and related to beam hardening, however, an acute to subacute infarct in this region is difficult to exclude. If indicated, this could be confirmed with MRI examination. Electronically Signed   By: Helyn Numbers M.D.   On: 04/24/2023 22:19   DG Chest Portable 1 View Result Date: 04/24/2023 CLINICAL DATA:  Altered mental status EXAM: PORTABLE CHEST 1 VIEW COMPARISON:  02/15/2023 FINDINGS: Minimal atelectasis or scarring at the left lung base. Lungs are otherwise clear. No pneumothorax or pleural effusion. Cardiac size within normal limits. No acute bone abnormality. IMPRESSION: No active disease. Electronically Signed   By: Helyn Numbers M.D.   On: 04/24/2023 21:32    Procedures Procedures    Medications Ordered in ED Medications  PHENObarbital (LUMINAL) 1,000 mg in sodium chloride 0.9 % 100 mL IVPB (0 mg Intravenous Stopped 04/24/23 2158)    ED Course/ Medical Decision Making/ A&P Clinical Course as of 04/24/23 2353  Mon Apr 24, 2023  2225 Watcher. Came in as a status  epilepticus. Multiple medications administered prior to arrival. Discussed with neurology immediately on their arrival.  They recommended phenobarbital load, CT head, EEG. Patient did have 1 further breakthrough of seizure during menstruation of phenobarb. However ceased appropriately. Next step would be fosphenytoin load.  I consulted critical care medicine agreed with admission. [CC]    Clinical Course User Index [CC] Glyn Ade, MD   {                               Medical Decision Making Amount and/or Complexity of Data Reviewed Labs: ordered. Radiology: ordered.   33 y.o. male with hx MS, seizures who presents with status epilepticus.  Patient has multiple presentations of similar.  Neurology was consulted upon patient arrival.  Recommended phenobarbital load.  Administered phenobarbital load over the course of 15 minutes.  They will place patient on EEG.  Requested CT head prior to administration of EEG.  CT head shows periventricular white matter consistent with diagnosis of MS.  There is also hypoattenuation of the inferior  left occipital pole, which neurology indicates is a common cause of artifact.  They will obtain an MRI head tomorrow.  Neurology advised that if patient has further episodes of seizure that do not self resolve, recommend fosphenytoin load.  Additionally stated patient could be intubated and started on propofol.  Patient had 1 further episode of seizure while in the ED.  Seizure lasted less than 1 minute.  Phenobarbital load had just started at the time of his seizure.  Seizure self resolved without any further medication administration.  Patient continues to maintain his airway with appropriate respirations and oxygenation on  nonrebreather.  Chest x-ray was obtained, which I reviewed, which was notable for no obvious focal consolidation or pleural effusion or pneumothorax.  COVID and flu negative.  CBC shows elevated leukocytosis 11.4 with neutrophilic  predominance.  Stable hemoglobin.  No electrolyte or metabolic derangements.  Glucose 122; low suspicion for hypoglycemia as a cause of seizures.  Given patient's multiple presentations, likely medication noncompliance as a cause of patient's status epilepticus today.  Given patient's mental status, will require admission to critical care.  Critical care contacted for admission and agreeable.  Patient will be admitted to ICU.  No further emergent interventions were required in the ED prior to admission.  Final Clinical Impression(s) / ED Diagnoses Final diagnoses:  Status epilepticus [facility])    Rx / DC Orders ED Discharge Orders     None      Renella Cunas, PGY-2 Emergency Medicine   Renella Cunas, MD 04/24/23 [id number]    Glyn Ade, MD 04/27/23 1511

## 2023-04-24 NOTE — ED Triage Notes (Signed)
 Pt bibgcems from home. Pt had seizure at home at 7pm. Family gave intranasal diazepam. Then patient seized again. Ems called. Pt stopped seizing but when ems arrived patient was sizing again. Ems gave 10 mg IM midazolam but was not effective. Pt stopped seizure like activity 15 minutes after midazolam admin.  148/95 Hr 110 98% BVM Cbg 110

## 2023-04-24 NOTE — H&P (Incomplete)
 NAME:  Miguel Black, MRN:  [id number], DOB:  Feb 16, 1990, LOS: 0 ADMISSION DATE:  04/24/2023, CONSULTATION DATE:  04/24/2023 REFERRING MD:  Glyn Ade, CHIEF COMPLAINT:  Seiaure  History of Present Illness:  Miguel Black is a 33 year old gentleman with known history for multiple sclerosis and seizures disorder.  Patient participated in physical therapy this a.m. secondary to known foot drop for increase available extremity strength and balance of flexibility.  At the end of PT session, patient had noted fatigue otherwise neurologically intact.  However this p.m. approximately 19:00 patient had a seizure at home and was administered intranasal diazepam by his family.  This was followed by a seizure for which the EMS services was called.  It is noted that after the EMS team arrived the patient continued to have seizures and was given 10 mg IM midazolam that did not immediately break break seizure activity until approximately 15 minutes later.  Neurology team was called and got a phenobarbital load.  In the ED level returned at 16, patient presumed compliant with antiseizure medication.  Patient remains difficult to arouse therefore airway protection was provided via nasal trumpet.  Neurology tech present placing EEG leads.  CBC WBC slight leukocytosis at 11.4, BMP within normal limits exception of CO2 28 glucose 122  CT scan of the head without contrast reveals no acute abnormality   Pertinent  Medical History  Acute encephalopathy (04/26/2022), Acute metabolic encephalopathy (04/26/2022), Acute respiratory failure with hypoxia (HCC) (08/23/2021), Aspiration pneumonia (HCC) (07/12/2018), Closed fracture of distal phalanx or phalanges of hand (02/05/2009), Encounter for screening for HIV (07/12/2018), Foot drop (06/03/2022), Marijuana abuse (05/01/2013), MS (multiple sclerosis) (HCC), Seizure (HCC), Seizures (HCC) (01/22/2019), and Status epilepticus (HCC) (07/12/2018).   Significant Hospital  Events: Including procedures, antibiotic start and stop dates in addition to other pertinent events   Phenobarbital/Depacon load in the ED EEG continuous monitoring     Objective   Blood pressure 129/86, pulse 92, resp. rate 17, SpO2 100%.       No intake or output data in the 24 hours ending 04/24/23 2302 There were no vitals filed for this visit.  Examination: Physical exam General: Obtunded, no apparent distress, responsive with sternal rub, difficult to arouse Lungs: Scattered Rales, no rhonchi, wheeze. Normal respiratory effort. No accessory muscle use Cardiac: S1 S2, no murmer, rub or gallop. - Extremity edema Abdomen: Softh, Non tender, + Bowel Sounds, no guarding, non distended Skin: warm, good turgor Neuro: Not seizing at this time, unable to fully assess, obtunded.  Pupils equal and responsive to light, motor good tone does withdrawal with stimuli (pinch) patient on the monitor was GU: Normal urine no Foley catheter  Resolved Hospital Problem list   N/A Assessment & Plan:  Seizure/status epilepticus  EEG monitoring  Neurochecks  Seizure and aspiration precautions  N.p.o.  Continue phenobarbital IV  Depacon 500mg  IV  Neurology following, continue current medications, eventual brain MRI Multiple sclerosis/gait disturbance   Monitor  Neurology following, outpatient [facility]  Ocrevus IV every 6 months  Restart home meds when clinically appropriate: Foot drop-continue AFO/cane assistance with gait PT OT to evaluate with fall precautions once alert and stable Leukocytosis  Monitor vitals  Repeat CBC in a.m.  Chest x-ray no acute pulmonary disease.     Best Practice (right click and "Reselect all SmartList Selections" daily)   Diet/type: {diet type:25684} DVT prophylaxis {anticoagulation:25687} Pressure ulcer(s): {pressure ulcer(s):31683} GI prophylaxis: {WU:98119} Lines: N/A Foley:  N/A Code Status:  full code Last date of  multidisciplinary  goals of care discussion []   Labs   CBC: Recent Labs  Lab 04/24/23 2052  WBC 11.4*  NEUTROABS 8.2*  HGB 14.6  HCT 45.7  MCV 100.4*  PLT 372    Basic Metabolic Panel: Recent Labs  Lab 04/24/23 2052  NA 141  K 4.3  CL 112*  CO2 20*  GLUCOSE 122*  BUN 8  CREATININE 1.06  CALCIUM 8.0*   GFR: CrCl cannot be calculated (Unknown ideal weight.). Recent Labs  Lab 04/24/23 2052  WBC 11.4*    Liver Function Tests: Recent Labs  Lab 04/24/23 2052  AST 17  ALT 14  ALKPHOS 48  BILITOT 0.5  PROT 6.7  ALBUMIN 3.8   No results for input(s): "LIPASE", "AMYLASE" in the last 168 hours. No results for input(s): "AMMONIA" in the last 168 hours.  ABG    Component Value Date/Time   PHART 7.413 02/13/2023 2047   PCO2ART 28.9 (L) 02/13/2023 2047   PO2ART 159 (H) 02/13/2023 2047   HCO3 18.3 (L) 02/13/2023 2047   TCO2 19 (L) 02/13/2023 2047   ACIDBASEDEF 5.0 (H) 02/13/2023 2047   O2SAT 99 02/13/2023 2047     Coagulation Profile: No results for input(s): "INR", "PROTIME" in the last 168 hours.  Cardiac Enzymes: No results for input(s): "CKTOTAL", "CKMB", "CKMBINDEX", "TROPONINI" in the last 168 hours.  HbA1C: Hgb A1c MFr Bld  Date/Time Value Ref Range Status  08/22/2021 06:37 AM 5.5 4.8 - 5.6 % Final    Comment:    (NOTE) Pre diabetes:          5.7%-6.4%  Diabetes:              >6.4%  Glycemic control for   <7.0% adults with diabetes     CBG: No results for input(s): "GLUCAP" in the last 168 hours.  Review of Systems:   Unable to solicit secondary to current status epilepticus state  Past Medical History:  He,  has a past medical history of Acute encephalopathy (04/26/2022), Acute metabolic encephalopathy (04/26/2022), Acute respiratory failure with hypoxia (HCC) (08/23/2021), Aspiration pneumonia (HCC) (07/12/2018), Closed fracture of distal phalanx or phalanges of hand (02/05/2009), Encounter for screening for HIV (07/12/2018), Foot drop (06/03/2022),  Marijuana abuse (05/01/2013), MS (multiple sclerosis) (HCC), Seizure (HCC), Seizures (HCC) (01/22/2019), and Status epilepticus (HCC) (07/12/2018).   Surgical History:   Past Surgical History:  Procedure Laterality Date  . NO PAST SURGERIES       Social History:   reports that he has been smoking cigars. He has never used smokeless tobacco. He reports current alcohol use. He reports current drug use. Drug: Marijuana.   Family History:  His family history includes Arthritis in his maternal grandmother and mother; Colon cancer (age of onset: 56) in his maternal uncle; Diabetes in his maternal grandmother and paternal grandmother; Healthy in his brother, father, and sister; Hodgkin's lymphoma in his brother; Hypertension in his maternal grandfather, maternal grandmother, and mother; Kidney disease in his maternal grandmother; Stroke in his maternal grandfather.   Allergies Allergies  Allergen Reactions  . Ampyra [Dalfampridine] Other (See Comments)    Seizure 02/2021 while on dalfampridine     Home Medications  Prior to Admission medications   Medication Sig Start Date End Date Taking? Authorizing Provider  diazePAM, 15 MG Dose, (VALTOCO 15 MG DOSE) 2 x 7.5 MG/0.1ML LQPK Place 1 Dose into the nose daily as needed. 03/09/23   Lula Olszewski, MD  divalproex (DEPAKOTE) 250 MG DR tablet TAKE 1  TABLET BY MOUTH TWICE A DAY 04/22/23   Lula Olszewski, MD  ocrelizumab (OCREVUS) 300 MG/10ML injection Inject 20 mLs (600 mg total) into the vein every 6 (six) months. 10/26/22   Sater, Pearletha Furl, MD  topiramate (TOPAMAX) 100 MG tablet TAKE 1 TABLET BY MOUTH TWICE A DAY 04/22/23   Lula Olszewski, MD     Critical care time: 35 minutes spent with patient at    [facility] T. Kizzie Ide, PA-C PC.CM

## 2023-04-24 NOTE — H&P (Signed)
 NAME:  Miguel Black, MRN:  [id number], DOB:  Feb 18, 1990, LOS: 0 ADMISSION DATE:  04/24/2023, CONSULTATION DATE:  04/24/2023 REFERRING MD:  Glyn Ade, CHIEF COMPLAINT:  Seizure  History of Present Illness:  Mr. Hanrahan is a 33 year old gentleman with known history for multiple sclerosis and seizures disorder.  Patient participated in physical therapy this a.m. secondary to known foot drop for increase available extremity strength and balance of flexibility.  At the end of PT session, patient had noted fatigue otherwise neurologically intact.  However this p.m. approximately 19:00 patient had a seizure at home and was administered intranasal diazepam by his family.  This was followed by a seizure for which the EMS services was called.  It is noted that after the EMS team arrived the patient continued to have seizures and was given 10 mg IM midazolam that did not immediately break break seizure activity until approximately 15 minutes later.  Neurology team was called and got a phenobarbital load.  In the ED lactic acid level returned at 16, patient presumed noncompliant with antiseizure medication.  Patient remains difficult to arouse therefore airway protection was provided via nasal trumpet.  Neurology tech present placing EEG leads.  CBC: WBC slight leukocytosis at 11.4, BMP within normal limits exception of CO2 28 glucose 122.  CT scan of the head without contrast reveals no acute abnormality neurology team following. CXR without acute pulmonary disease.  PCCM called to ED to evaluate and admit patient for continuous EEG monitoring. Neurology following..   Pertinent  Medical History  Acute encephalopathy (04/26/2022), Acute metabolic encephalopathy (04/26/2022), Acute respiratory failure with hypoxia (HCC) (08/23/2021), Aspiration pneumonia (HCC) (07/12/2018), Closed fracture of distal phalanx or phalanges of hand (02/05/2009), Encounter for screening for HIV (07/12/2018), Foot drop  (06/03/2022), Marijuana abuse (05/01/2013), MS (multiple sclerosis) (HCC), Seizure (HCC), Seizures (HCC) (01/22/2019), and Status epilepticus (HCC) (07/12/2018).   Significant Hospital Events: Including procedures, antibiotic start and stop dates in addition to other pertinent events   Phenobarbital/Depacon load in the ED EEG continuous monitoring     Objective   Blood pressure 129/86, pulse 92, resp. rate 17, SpO2 100%.       No intake or output data in the 24 hours ending 04/24/23 2302 There were no vitals filed for this visit.  Examination: Physical exam General: Obtunded, no apparent distress, responsive with sternal rub, difficult to arouse Lungs: Scattered Rales, no rhonchi, wheeze. Normal respiratory effort. No accessory muscle use Cardiac: S1 S2, no murmer, rub or gallop. - Extremity edema Abdomen: Softh, Non tender, + Bowel Sounds, no guarding, non distended Skin: warm, good turgor Neuro: Not seizing at this time, unable to fully assess, obtunded.  Pupils equal and responsive to light, motor good tone does withdrawal with stimuli (pinch) patient on the monitor was GU: Normal urine no Foley catheter  Resolved Hospital Problem list   N/A Assessment & Plan:  Seizure/status epilepticus  EEG monitoring  Neurochecks  Seizure and aspiration precautions  N.p.o.  Continue phenobarbital IV  Depacon 500mg  IV  Neurology following, continue current medications, eventual brain MRI Multiple sclerosis/gait disturbance   Monitor  Neurology following, outpatient [place] [facility]  Ocrevus IV every 6 months  Restart home meds when clinically appropriate: Foot drop-continue AFO/cane assistance with gait PT OT to evaluate with fall precautions once alert and stable Leukocytosis  Monitor vitals  Repeat CBC in a.m.  Chest x-ray no acute pulmonary disease.     Best Practice (right click and "Reselect all SmartList Selections" daily)  Diet/type: NPO DVT prophylaxis  LMWH Pressure ulcer(s): N/A GI prophylaxis: N/A Lines: N/A Foley:  N/A Code Status:  full code Last date of multidisciplinary goals of care discussion []   Labs   CBC: Recent Labs  Lab 04/24/23 2052  WBC 11.4*  NEUTROABS 8.2*  HGB 14.6  HCT 45.7  MCV 100.4*  PLT 372    Basic Metabolic Panel: Recent Labs  Lab 04/24/23 2052  NA 141  K 4.3  CL 112*  CO2 20*  GLUCOSE 122*  BUN 8  CREATININE 1.06  CALCIUM 8.0*   GFR: CrCl cannot be calculated (Unknown ideal weight.). Recent Labs  Lab 04/24/23 2052  WBC 11.4*    Liver Function Tests: Recent Labs  Lab 04/24/23 2052  AST 17  ALT 14  ALKPHOS 48  BILITOT 0.5  PROT 6.7  ALBUMIN 3.8   No results for input(s): "LIPASE", "AMYLASE" in the last 168 hours. No results for input(s): "AMMONIA" in the last 168 hours.  ABG    Component Value Date/Time   PHART 7.413 02/13/2023 2047   PCO2ART 28.9 (L) 02/13/2023 2047   PO2ART 159 (H) 02/13/2023 2047   HCO3 18.3 (L) 02/13/2023 2047   TCO2 19 (L) 02/13/2023 2047   ACIDBASEDEF 5.0 (H) 02/13/2023 2047   O2SAT 99 02/13/2023 2047     Coagulation Profile: No results for input(s): "INR", "PROTIME" in the last 168 hours.  Cardiac Enzymes: No results for input(s): "CKTOTAL", "CKMB", "CKMBINDEX", "TROPONINI" in the last 168 hours.  HbA1C: Hgb A1c MFr Bld  Date/Time Value Ref Range Status  08/22/2021 06:37 AM 5.5 4.8 - 5.6 % Final    Comment:    (NOTE) Pre diabetes:          5.7%-6.4%  Diabetes:              >6.4%  Glycemic control for   <7.0% adults with diabetes     CBG: No results for input(s): "GLUCAP" in the last 168 hours.  Review of Systems:   Unable to solicit secondary to current status epilepticus state  Past Medical History:  He,  has a past medical history of Acute encephalopathy (04/26/2022), Acute metabolic encephalopathy (04/26/2022), Acute respiratory failure with hypoxia (HCC) (08/23/2021), Aspiration pneumonia (HCC) (07/12/2018), Closed  fracture of distal phalanx or phalanges of hand (02/05/2009), Encounter for screening for HIV (07/12/2018), Foot drop (06/03/2022), Marijuana abuse (05/01/2013), MS (multiple sclerosis) (HCC), Seizure (HCC), Seizures (HCC) (01/22/2019), and Status epilepticus (HCC) (07/12/2018).   Surgical History:   Past Surgical History:  Procedure Laterality Date   NO PAST SURGERIES       Social History:   reports that he has been smoking cigars. He has never used smokeless tobacco. He reports current alcohol use. He reports current drug use. Drug: Marijuana.   Family History:  His family history includes Arthritis in his maternal grandmother and mother; Colon cancer (age of onset: 44) in his maternal uncle; Diabetes in his maternal grandmother and paternal grandmother; Healthy in his brother, father, and sister; Hodgkin's lymphoma in his brother; Hypertension in his maternal grandfather, maternal grandmother, and mother; Kidney disease in his maternal grandmother; Stroke in his maternal grandfather.   Allergies Allergies  Allergen Reactions   Ampyra [Dalfampridine] Other (See Comments)    Seizure 02/2021 while on dalfampridine     Home Medications  Prior to Admission medications   Medication Sig Start Date End Date Taking? Authorizing Provider  diazePAM, 15 MG Dose, (VALTOCO 15 MG DOSE) 2 x 7.5 MG/0.1ML LQPK Place  1 Dose into the nose daily as needed. 03/09/23   Lula Olszewski, MD  divalproex (DEPAKOTE) 250 MG DR tablet TAKE 1 TABLET BY MOUTH TWICE A DAY 04/22/23   Lula Olszewski, MD  ocrelizumab (OCREVUS) 300 MG/10ML injection Inject 20 mLs (600 mg total) into the vein every 6 (six) months. 10/26/22   Sater, Pearletha Furl, MD  topiramate (TOPAMAX) 100 MG tablet TAKE 1 TABLET BY MOUTH TWICE A DAY 04/22/23   Lula Olszewski, MD     Critical care time: 35 minutes spent with patient at    [facility] T. Kizzie Ide, PA-C PC.CM

## 2023-04-24 NOTE — Progress Notes (Signed)
 LTM EEG hooked up and running - no initial skin breakdown - push button tested - [facility] unable to monitor; patient in the ED currently

## 2023-04-24 NOTE — ED Notes (Signed)
 Pt had a seizure for about one minute. Increased Phenobarbital to 15 minutes from 60 minutes. Pa Giles to bedside.

## 2023-04-24 NOTE — Therapy (Signed)
 OUTPATIENT PHYSICAL THERAPY NEURO TREATMENT NOTE   Patient Name: Miguel Black MRN: [id number] DOB:14-Jan-1991, 33 y.o., male Today's Date: 04/24/2023   PCP: Glenetta Hew, MD REFERRING PROVIDER: Scheryl Marten. Rhona Leavens, MD>Dr. Epimenio Foot (f/u in March 2025)  END OF SESSION:  PT End of Session - 04/24/23 0934     Visit Number 6    Number of Visits 12    Date for PT Re-Evaluation 05/05/23    Authorization Type Medicare/Medicaid of [place]    PT Start Time 0935    PT Stop Time 1009    PT Time Calculation (min) 34 min    Equipment Utilized During Treatment Gait belt    Activity Tolerance Patient tolerated treatment well;Patient limited by fatigue    Behavior During Therapy WFL for tasks assessed/performed               Past Medical History:  Diagnosis Date   Acute encephalopathy 04/26/2022   Acute metabolic encephalopathy 04/26/2022   Acute respiratory failure with hypoxia (HCC) 08/23/2021   Aspiration pneumonia (HCC) 07/12/2018   Closed fracture of distal phalanx or phalanges of hand 02/05/2009   Formatting of this note might be different from the original. Overview: The original diagnosis was replaced automatically by the ICD-10 team to an ICD-10 compliant one. Please review for accuracy   Encounter for screening for HIV 07/12/2018   Foot drop 06/03/2022   Has a left foot drop ever since 2015 first flare of multiple sclerosis had a brace in 2022 custom but does not actually do anything to lift the from the foot physical therapist advised him to get an alternative foot drop brace but I cannot find record of that   Marijuana abuse 05/01/2013   MS (multiple sclerosis) (HCC)    Seizure (HCC)    Seizures (HCC) 01/22/2019   Status epilepticus (HCC) 07/12/2018   Past Surgical History:  Procedure Laterality Date   NO PAST SURGERIES     Patient Active Problem List   Diagnosis Date Noted   Seizures (HCC) 11/12/2022   Breakthrough seizure (HCC) 10/07/2022   Leukocytosis 10/07/2022    Hypocalcemia 10/07/2022   Elevated AST (SGOT) 10/07/2022   Status epilepticus (HCC) 06/13/2022   Ambulates with cane 06/04/2022   Ataxia 06/04/2022   Foot drop 06/03/2022   Vitamin D deficiency 06/03/2022   Smokes cigars 06/03/2022   Neurological deficit present 04/26/2022   History of optic neuritis 07/29/2020   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  Did a few of my exercises  Pt accompanied by: self  PERTINENT HISTORY: See above; MS, hx of seizures  PAIN:  Are you having pain? No  PRECAUTIONS: Fall and Other: wears AFO LLE  RED FLAGS: None   WEIGHT BEARING RESTRICTIONS: No  FALLS: Has patient fallen in last 6 months? No  LIVING ENVIRONMENT: Lives with: lives with their family Lives in: House/apartment Stairs: Inside and outside steps with rails Has following equipment at home: Single point cane  PLOF: Independent, Independent with household mobility with device, and Independent with community mobility with device  PATIENT GOALS: To work on balance and strength on L side  OBJECTIVE:    TODAY'S TREATMENT: 04/24/2023 Activity Comments  From mat surface:  sit to stand x 10 reps No UE  support  Squat/sit to stand, from elevated mat, 2 x 5 reps Min guard  Gait training, wearing L AFO today, 50 ft, 40 ft 66M: 23.69 sec= 1.38 ft/sec with cane Improved from 1.06 ft/sec  Standing EO/EC head turns/head nods x 5, feet apart>narrowed BOS BUE support>1 UE support  Standing balance exercises with upper body holding ball-trunk rotation and diagonals Min guard and cues to increase L weightshift  Forward step tap, lunge into knee flexion on 4", 8" step, 6 reps each leg BUE support  Seated LAQ 8 reps Seated hamstring curls against red therapy ball, 8 reps 3#, 2 sets        HOME EXERCISE PROGRAM: Access Code: [email or web address] URL: [email or web address] Date: 04/03/2023 Prepared by: [facility] - Outpatient  Rehab - [facility]  Exercises - Mini Squat with Counter Support  - 1 x daily - 5 x weekly - 3 sets - 5 reps - Sit to Stand  - 1 x daily - 5 x weekly - 3 sets - 5 reps - Supine March  - 1 x daily - 5 x weekly - 2 sets - 10 reps-green theraband - Supine Bridge  - 1 x daily - 5 x weekly - 2-3 sets - 5 reps-green theraband - Sit to stand in stride stance  - 1 x daily - 5 x weekly - 3 sets - 5 reps - Standing Gastroc Stretch at Counter  - 1-2 x daily - 7 x weekly - 1 sets - 3 reps - 15-30 sec hold - Standing Tandem Balance with Counter Support  - 1 x daily - 5 x weekly - 1 sets - 3 reps - 15 sec hold - Single Leg Stance with Support  - 1 x daily - 5 x weekly - 1 sets - 3 reps - 10 sec hold   *Pt reports fatigue by end of session    PATIENT EDUCATION: Education details: Discussed use of stationary bike at home, looking into townhome complex potential for exercise room Person educated: Patient Education method: Explanation, Demonstration, and Handouts Education comprehension: verbalized understanding, returned demonstration, and needs further education   Note: Objective measures were completed at Evaluation unless otherwise noted.  DIAGNOSTIC FINDINGS: MRI  02/14/2023:  IMPRESSION: 1. No acute intracranial abnormality by CT. Chronic multiple sclerosis.  COGNITION: Overall cognitive status:  noted in chart-encephalopathy and ARF with hypoxia; hx of depression and suicidal ideation; needs to be re-directed in PT eval multiple times, due to conversation not appropriate to therapy in nature     LOWER EXTREMITY MMT:    MMT Right Eval Left Eval  Hip flexion 5 4  Hip extension    Hip abduction 4  4  Hip adduction 4 4  Hip internal rotation    Hip external rotation    Knee flexion 4+ 4  Knee extension 4+ 4  Ankle dorsiflexion    Ankle plantarflexion    Ankle inversion    Ankle eversion    (Blank rows = not tested)  TRANSFERS: Assistive device utilized: None  Sit to stand: Modified independence Stand to sit: Modified independence and pt tends to leave LLE far in front and does not fully turn to place LLE under trunk when preparing to sit from gait activities.  GAIT: Gait pattern: step through pattern, decreased stance time- Left, genu recurvatum- Left, ataxic, and wide BOS Distance walked: 50 ft Assistive device utilized: Single point cane and with small rubber quad tip base Level of assistance: SBA Comments: unsteady, extra time with gait  FUNCTIONAL TESTS:  5 times sit to stand: 27.66 sec  Timed up and go (TUG): 27.53 sec 10 meter walk test: 30.72 sec = 1.06 ft/sec Berg Balance Scale: 34/56                                                                                                                                  TREATMENT DATE: 03/07/2023    PATIENT EDUCATION: Education details: Eval results, POC Person educated: Patient Education method: Explanation Education comprehension: verbalized understanding  HOME EXERCISE PROGRAM: Not yet initiated  GOALS: Goals reviewed with patient? Yes  SHORT TERM GOALS: Target date: 04/07/2023  Pt will be independent with HEP for improved balance, strength,  gait. Baseline: Goal status: MET, 04/03/2023  2.  Pt will improve 5x sit<>stand to less than or equal to 20 sec to demonstrate improved functional strength and transfer efficiency. Baseline: 27.66 sec> 15 seconds 04/03/2023 Goal status: MET, 04/03/2023   LONG TERM GOALS: Target date: 05/05/2023  Pt will be independent with HEP for improved balance, strength, gait. Baseline:  Goal status: IN PROGRESS  2.  Pt will improve Berg score to at least 40/56 to decrease fall risk. Baseline: 34/56 Goal status: IN PROGRESS  3.  Pt will improve gait velocity to at least 1.5 ft/sec for improved gait efficiency and safety. Baseline: 1.05 ft/sec>1.38 ft/sec 04/24/2023 Goal status: IN PROGRESS  4.  Pt will verbalize plans for continued community fitness upon d/c from PT. Baseline:  Goal status: IN PROGRESS   ASSESSMENT:  CLINICAL IMPRESSION: Pt presents today reporting he missed last visit due to illness. Skilled PT session focused on LLE strength, balance, flexibility. Pt wears AFO today and this provides additional stability for gait and standing exercises.  At end of PT session, he does have more instability due to fatigue.  Educated pt in looking into options for continued community fitness.  Pt will continue to benefit from skilled PT towards goals for improved functional mobility and decreased fall risk.   OBJECTIVE IMPAIRMENTS: Abnormal gait, decreased balance, decreased mobility, difficulty walking, and decreased strength.   ACTIVITY LIMITATIONS: standing,  squatting, transfers, and locomotion level  PARTICIPATION LIMITATIONS: driving, community activity, and community fitness  PERSONAL FACTORS: 3+ comorbidities: see PMH  are also affecting patient's functional outcome.   REHAB POTENTIAL: Good  CLINICAL DECISION MAKING: Evolving/moderate complexity  EVALUATION COMPLEXITY: Moderate  PLAN:  PT FREQUENCY: 1-2x/week  PT DURATION: 8 weeks  PLANNED INTERVENTIONS: 97110-Therapeutic  exercises, 97530- Therapeutic activity, [id number]- Neuromuscular re-education, 97535- Self Care, 82956- Manual therapy, [id number]- Gait training, Patient/Family education, Balance training, and DME instructions  PLAN FOR NEXT SESSION: Check LTGs and discuss discharge.   Gean Maidens., PT 04/24/2023, 10:16 AM  [facility] [address] [place], [address] [place], [place] [address] Phone # [phone] Fax # [phone]

## 2023-04-25 ENCOUNTER — Inpatient Hospital Stay (HOSPITAL_COMMUNITY)

## 2023-04-25 DIAGNOSIS — E7220 Disorder of urea cycle metabolism, unspecified: Secondary | ICD-10-CM | POA: Diagnosis present

## 2023-04-25 DIAGNOSIS — K5900 Constipation, unspecified: Secondary | ICD-10-CM | POA: Diagnosis not present

## 2023-04-25 DIAGNOSIS — R4701 Aphasia: Secondary | ICD-10-CM | POA: Diagnosis present

## 2023-04-25 DIAGNOSIS — L03317 Cellulitis of buttock: Secondary | ICD-10-CM | POA: Diagnosis not present

## 2023-04-25 DIAGNOSIS — L89159 Pressure ulcer of sacral region, unspecified stage: Secondary | ICD-10-CM | POA: Diagnosis not present

## 2023-04-25 DIAGNOSIS — D72829 Elevated white blood cell count, unspecified: Secondary | ICD-10-CM | POA: Diagnosis not present

## 2023-04-25 DIAGNOSIS — G40901 Epilepsy, unspecified, not intractable, with status epilepticus: Secondary | ICD-10-CM | POA: Diagnosis present

## 2023-04-25 DIAGNOSIS — E872 Acidosis, unspecified: Secondary | ICD-10-CM | POA: Diagnosis present

## 2023-04-25 DIAGNOSIS — Z91148 Patient's other noncompliance with medication regimen for other reason: Secondary | ICD-10-CM | POA: Diagnosis not present

## 2023-04-25 DIAGNOSIS — H5500 Unspecified nystagmus: Secondary | ICD-10-CM | POA: Diagnosis present

## 2023-04-25 DIAGNOSIS — L89154 Pressure ulcer of sacral region, stage 4: Secondary | ICD-10-CM | POA: Diagnosis present

## 2023-04-25 DIAGNOSIS — L89152 Pressure ulcer of sacral region, stage 2: Secondary | ICD-10-CM | POA: Diagnosis present

## 2023-04-25 DIAGNOSIS — R1311 Dysphagia, oral phase: Secondary | ICD-10-CM | POA: Diagnosis not present

## 2023-04-25 DIAGNOSIS — Z1152 Encounter for screening for COVID-19: Secondary | ICD-10-CM | POA: Diagnosis not present

## 2023-04-25 DIAGNOSIS — A419 Sepsis, unspecified organism: Secondary | ICD-10-CM | POA: Diagnosis present

## 2023-04-25 DIAGNOSIS — R569 Unspecified convulsions: Secondary | ICD-10-CM | POA: Diagnosis not present

## 2023-04-25 DIAGNOSIS — F17290 Nicotine dependence, other tobacco product, uncomplicated: Secondary | ICD-10-CM | POA: Diagnosis present

## 2023-04-25 DIAGNOSIS — L89153 Pressure ulcer of sacral region, stage 3: Secondary | ICD-10-CM | POA: Diagnosis present

## 2023-04-25 DIAGNOSIS — G9340 Encephalopathy, unspecified: Secondary | ICD-10-CM | POA: Diagnosis not present

## 2023-04-25 DIAGNOSIS — D84821 Immunodeficiency due to drugs: Secondary | ICD-10-CM | POA: Diagnosis present

## 2023-04-25 DIAGNOSIS — R29810 Facial weakness: Secondary | ICD-10-CM | POA: Diagnosis present

## 2023-04-25 DIAGNOSIS — M21372 Foot drop, left foot: Secondary | ICD-10-CM | POA: Diagnosis present

## 2023-04-25 DIAGNOSIS — L89312 Pressure ulcer of right buttock, stage 2: Secondary | ICD-10-CM | POA: Diagnosis present

## 2023-04-25 DIAGNOSIS — L89313 Pressure ulcer of right buttock, stage 3: Secondary | ICD-10-CM | POA: Diagnosis present

## 2023-04-25 DIAGNOSIS — G8191 Hemiplegia, unspecified affecting right dominant side: Secondary | ICD-10-CM | POA: Diagnosis present

## 2023-04-25 DIAGNOSIS — R339 Retention of urine, unspecified: Secondary | ICD-10-CM | POA: Diagnosis present

## 2023-04-25 DIAGNOSIS — R34 Anuria and oliguria: Secondary | ICD-10-CM | POA: Diagnosis not present

## 2023-04-25 DIAGNOSIS — L0231 Cutaneous abscess of buttock: Secondary | ICD-10-CM | POA: Diagnosis present

## 2023-04-25 DIAGNOSIS — N179 Acute kidney failure, unspecified: Secondary | ICD-10-CM | POA: Diagnosis not present

## 2023-04-25 DIAGNOSIS — T426X6A Underdosing of other antiepileptic and sedative-hypnotic drugs, initial encounter: Secondary | ICD-10-CM | POA: Diagnosis present

## 2023-04-25 DIAGNOSIS — G35 Multiple sclerosis: Secondary | ICD-10-CM | POA: Diagnosis present

## 2023-04-25 LAB — MAGNESIUM
Magnesium: 2 mg/dL (ref 1.7–2.4)
Magnesium: 2.2 mg/dL (ref 1.7–2.4)

## 2023-04-25 LAB — CBC
HCT: 43.7 % (ref 39.0–52.0)
Hemoglobin: 14.5 g/dL (ref 13.0–17.0)
MCH: 32.2 pg (ref 26.0–34.0)
MCHC: 33.2 g/dL (ref 30.0–36.0)
MCV: 96.9 fL (ref 80.0–100.0)
Platelets: 304 10*3/uL (ref 150–400)
RBC: 4.51 MIL/uL (ref 4.22–5.81)
RDW: 14.8 % (ref 11.5–15.5)
WBC: 21.5 10*3/uL — ABNORMAL HIGH (ref 4.0–10.5)
nRBC: 0 % (ref 0.0–0.2)

## 2023-04-25 LAB — BASIC METABOLIC PANEL WITH GFR
Anion gap: 13 (ref 5–15)
BUN: 9 mg/dL (ref 6–20)
CO2: 17 mmol/L — ABNORMAL LOW (ref 22–32)
Calcium: 8.4 mg/dL — ABNORMAL LOW (ref 8.9–10.3)
Chloride: 110 mmol/L (ref 98–111)
Creatinine, Ser: 0.89 mg/dL (ref 0.61–1.24)
GFR, Estimated: >60 mL/min (ref >60)
Glucose, Bld: 98 mg/dL (ref 70–99)
Potassium: 3.5 mmol/L (ref 3.5–5.1)
Sodium: 140 mmol/L (ref 135–145)

## 2023-04-25 LAB — URINALYSIS, ROUTINE W REFLEX MICROSCOPIC
Bilirubin Urine: NEGATIVE
Glucose, UA: NEGATIVE mg/dL
Hgb urine dipstick: NEGATIVE
Ketones, ur: NEGATIVE mg/dL
Leukocytes,Ua: NEGATIVE
Nitrite: NEGATIVE
Protein, ur: NEGATIVE mg/dL
Specific Gravity, Urine: 1.019 (ref 1.005–1.030)
pH: 6 (ref 5.0–8.0)

## 2023-04-25 LAB — LACTIC ACID, PLASMA
Lactic Acid, Venous: 1.9 mmol/L (ref 0.5–1.9)
Lactic Acid, Venous: 1.7 mmol/L (ref 0.5–1.9)
Lactic Acid, Venous: 1.3 mmol/L (ref 0.5–1.9)

## 2023-04-25 LAB — MRSA NEXT GEN BY PCR, NASAL: MRSA by PCR Next Gen: NOT DETECTED

## 2023-04-25 LAB — PHOSPHORUS
Phosphorus: 3.6 mg/dL (ref 2.5–4.6)
Phosphorus: 2.9 mg/dL (ref 2.5–4.6)

## 2023-04-25 LAB — GLUCOSE, CAPILLARY
Glucose-Capillary: 109 mg/dL — ABNORMAL HIGH (ref 70–99)
Glucose-Capillary: 98 mg/dL (ref 70–99)
Glucose-Capillary: 113 mg/dL — ABNORMAL HIGH (ref 70–99)
Glucose-Capillary: 97 mg/dL (ref 70–99)
Glucose-Capillary: 103 mg/dL — ABNORMAL HIGH (ref 70–99)

## 2023-04-25 LAB — HEMOGLOBIN A1C
Hgb A1c MFr Bld: 4.7 % — ABNORMAL LOW (ref 4.8–5.6)
Mean Plasma Glucose: 88.19 mg/dL

## 2023-04-25 MED ORDER — VANCOMYCIN HCL 1500 MG/300ML IV SOLN
1500.0000 mg | Freq: Once | INTRAVENOUS | Status: AC
  Administered 2023-04-25: 1500 mg via INTRAVENOUS
  Filled 2023-04-25: qty 300

## 2023-04-25 MED ORDER — JUVEN PO PACK
1.0000 | PACK | Freq: Two times a day (BID) | ORAL | Status: DC
  Administered 2023-04-26: 1
  Filled 2023-04-25: qty 1

## 2023-04-25 MED ORDER — SODIUM CHLORIDE 0.9 % IV SOLN: INTRAVENOUS | Status: AC

## 2023-04-25 MED ORDER — CHLORHEXIDINE GLUCONATE CLOTH 2 % EX PADS: 6.0000 | MEDICATED_PAD | Freq: Every day | CUTANEOUS | Status: DC

## 2023-04-25 MED ORDER — INSULIN ASPART 100 UNIT/ML IJ SOLN
0.0000 [IU] | INTRAMUSCULAR | Status: DC
  Administered 2023-04-26: 2 [IU] via SUBCUTANEOUS
  Administered 2023-04-27 (×2): 1 [IU] via SUBCUTANEOUS

## 2023-04-25 MED ORDER — IOHEXOL 350 MG/ML SOLN
75.0000 mL | Freq: Once | INTRAVENOUS | Status: AC | PRN
Start: 2023-04-25 — End: 2023-04-25
  Administered 2023-04-25: 75 mL via INTRAVENOUS

## 2023-04-25 MED ORDER — PIPERACILLIN-TAZOBACTAM 3.375 G IVPB 30 MIN
3.3750 g | Freq: Once | INTRAVENOUS | Status: AC
  Administered 2023-04-25: 3.375 g via INTRAVENOUS
  Filled 2023-04-25 (×2): qty 50

## 2023-04-25 MED ORDER — LORAZEPAM 2 MG/ML IJ SOLN: 2.0000 mg | INTRAMUSCULAR | Status: DC | PRN

## 2023-04-25 MED ORDER — POLYETHYLENE GLYCOL 3350 17 G PO PACK: 17.0000 g | PACK | Freq: Every day | ORAL | Status: DC | PRN

## 2023-04-25 MED ORDER — SODIUM CHLORIDE 0.9 % IV SOLN
2.0000 g | Freq: Two times a day (BID) | INTRAVENOUS | Status: DC
  Administered 2023-04-25: 2 g via INTRAVENOUS
  Filled 2023-04-25: qty 20

## 2023-04-25 MED ORDER — DOCUSATE SODIUM 100 MG PO CAPS: 100.0000 mg | ORAL_CAPSULE | Freq: Two times a day (BID) | ORAL | Status: DC | PRN

## 2023-04-25 MED ORDER — PIPERACILLIN-TAZOBACTAM 3.375 G IVPB
3.3750 g | Freq: Three times a day (TID) | INTRAVENOUS | Status: DC
  Administered 2023-04-26 (×4): 3.375 g via INTRAVENOUS
  Filled 2023-04-25 (×3): qty 50

## 2023-04-25 MED ORDER — CHLORHEXIDINE GLUCONATE CLOTH 2 % EX PADS: 6.0000 | MEDICATED_PAD | Freq: Every evening | CUTANEOUS | Status: DC

## 2023-04-25 MED ORDER — ORAL CARE MOUTH RINSE
15.0000 mL | OROMUCOSAL | Status: DC | PRN
  Administered 2023-04-26: 15 mL via OROMUCOSAL

## 2023-04-25 MED ORDER — VANCOMYCIN HCL 750 MG/150ML IV SOLN
750.0000 mg | Freq: Three times a day (TID) | INTRAVENOUS | Status: DC
  Administered 2023-04-25 – 2023-04-27 (×6): 750 mg via INTRAVENOUS
  Filled 2023-04-25 (×7): qty 150

## 2023-04-25 MED ORDER — ENOXAPARIN SODIUM 40 MG/0.4ML IJ SOSY
40.0000 mg | PREFILLED_SYRINGE | INTRAMUSCULAR | Status: DC
  Administered 2023-04-25 – 2023-05-01 (×7): 40 mg via SUBCUTANEOUS
  Filled 2023-04-25 (×7): qty 0.4

## 2023-04-25 MED ORDER — OSMOLITE 1.5 CAL PO LIQD
1000.0000 mL | ORAL | Status: DC
  Administered 2023-04-25: 1000 mL

## 2023-04-25 MED ORDER — CHLORHEXIDINE GLUCONATE CLOTH 2 % EX PADS
6.0000 | MEDICATED_PAD | Freq: Every day | CUTANEOUS | Status: DC
  Administered 2023-04-25 – 2023-04-28 (×4): 6 via TOPICAL

## 2023-04-25 MED ORDER — ORAL CARE MOUTH RINSE
15.0000 mL | OROMUCOSAL | Status: DC
  Administered 2023-04-25 – 2023-04-27 (×12): 15 mL via OROMUCOSAL

## 2023-04-25 MED ORDER — PHENOBARBITAL SODIUM 130 MG/ML IJ SOLN
65.0000 mg | Freq: Two times a day (BID) | INTRAMUSCULAR | Status: DC
  Administered 2023-04-25 (×2): 65 mg via INTRAVENOUS
  Filled 2023-04-25 (×2): qty 1

## 2023-04-25 MED ORDER — PROSOURCE TF20 ENFIT COMPATIBL EN LIQD
60.0000 mL | Freq: Every day | ENTERAL | Status: DC
  Administered 2023-04-26: 60 mL

## 2023-04-25 MED ORDER — ACETAMINOPHEN 650 MG RE SUPP
650.0000 mg | Freq: Four times a day (QID) | RECTAL | Status: DC | PRN
Start: 2023-04-25 — End: 2023-05-01
  Administered 2023-04-25: 650 mg via RECTAL
  Filled 2023-04-25: qty 1

## 2023-04-25 NOTE — Progress Notes (Addendum)
 eLink Physician-Brief Progress Note Patient Name: KENZIE FLAKES DOB: [birth year] MRN: [id number]   Date of Service  04/25/2023  HPI/Events of Note  9M with MS and seizure disease who presented with seizure at home and given intranasal diazepam by family. EMS called and patient given 10 mg IM midazolam for recurrent seizure. Given phenobarb per Neurology. In the ED nasal trumpet in place. Currently protecting airway. LA 16. CT NAICA. Admitted to ICU by CCM  eICU Interventions  AEDs per Neuro. Phenobarb load followed by BID. PRN ativan ordered LTM Monitor for airway protection. Low threshold to intubate if needed   1:40 AM No enteral access. Rectal tylenol ordered PRN for fever 101. Order for NGT placement per RN  4:10 AM CBG checks with SSI ordered  Intervention Category Evaluation Type: New Patient Evaluation  Leira Regino Mechele Collin 04/25/2023, 12:45 AM

## 2023-04-25 NOTE — Progress Notes (Signed)
 Pharmacy Antibiotic Note  Miguel Black is a 33 y.o. male admitted on 04/24/2023 for seizure, leukocytosis and concern for sepsis.  Pharmacy has been consulted for vancomycin dosing.  Plan: Vancomycin 1500 mg IV x 1, then 750 mg IV q 8h (eAUC 461) Monitor renal function, Cx and clinical progression to narrow Vancomycin levels as indicated   Weight: 68.1 kg (150 lb 2.1 oz)  Temp (24hrs), Avg:99.2 F (37.3 C), Min:97.9 F (36.6 C), Max:100.5 F (38.1 C)  Recent Labs  Lab 04/24/23 2052 04/25/23 0622  WBC 11.4* 21.5*  CREATININE 1.06 0.89  LATICACIDVEN  --  1.9    Estimated Creatinine Clearance: 115.8 mL/min (by C-G formula based on SCr of 0.89 mg/dL).    Allergies  Allergen Reactions   Ampyra [Dalfampridine] Other (See Comments)    Seizure 02/2021 while on dalfampridine    Daylene Posey, PharmD, [facility] Clinical Pharmacist ED Pharmacist Phone # [phone] 04/25/2023 9:40 AM

## 2023-04-25 NOTE — Progress Notes (Signed)
 Called patient's mother Samara Deist to discuss her son's care.   Mother reports he has been compliant with seizure meds  Getting close to his infusion for his MS. 1-2 weeks ago started sweating profusely. Worried about sore being infected (previously precipitated seizure, "busted open" recently and had pus and pain there). Had relief in symptoms when it "bust" at the end of last week. His baseline isn't bed bound, he is up walking around. Recently had stress from his father being diagnosed with heart failure.   Per MRI in January, no OM.  Steffanie Dunn, DO 04/25/23 11:32 AM [facility]

## 2023-04-25 NOTE — Progress Notes (Signed)
 NEUROLOGY CONSULT FOLLOW UP NOTE   Date of service: April 25, 2023 Patient Name: Miguel Black MRN:  [id number] DOB:  23-Feb-1990  Miguel Black is a 33 y.o. left-handed man with a PMHx significant for MS on Ocrevus (last dose due 11/23/2022 per notes but unclear if he received this), complicated by epilepsy (on Depakote and either lamotrigine or topiramate)   He has had frequent presentations with seizures in the past secondary to medication nonadherence frequently, but sometimes also in the setting of medication adherence.    Last admission 02/13/2023 he had medication non-adherence, was discharged on  Depakote 250 mg BID (02/15/2023 Was on Depakote 250 every 6 hours (1 gram daily) -- level 68 but ammonia 82 so reduced to 250 mg every 12 hours (500 mg daily with improved ammonia to 24)  Topirimate 100 mg BID  He was scheduled for follow-up on 04/03/2023 with neurology but did not follow up  For example on admission 10/07/2022 his valproic acid level was therapeutic so lamotrigine was added.  However on PCP follow-up 11/24/2022 topiramate was started, he was encouraged to use a high CBD marijuana, and there was a plan to refer to new neurologist per notes; he has previously followed with Dr. Epimenio Foot at [facility] [place] [facility], last seen by NP Lomax on 08/08/2022 with prescription for Ocrevus ordered on 10/26/2022   Per dispense report available 02/13/2023 Topiramate was last dispensed 02/11/2023 for a 90-day supply Lamotrigine was last dispensed 11/14/2022 for a 30-day supply Zonisamide was last dispensed 11/18/2022 for a 90-day supply Depakote was last dispensed 10/09/2022 for a 90-day supply   Other prior seizure medications trialed: Keppra and Briviact both associated with moved changes Vimpat, unclear why discontinued (06/15/2022 note switched back to home zonisamide)  Semiology: On 11/22/2022 notes: "Spoke with patient's mother who reports that he was sitting and chatting with his  family when he abruptly stopped talking, staring off and had a witnessed right arm shaking with his eyes open. The shaking went on for a minute or 2 and resolved. Family was able to move him to the bedroom where he had 5 additional episodes of right arm jerking with eyes open lasting about a minute and he kept going in and out of the spells."  Interval Hx/subjective   - Slowly improving mental status, non-verbal  Vitals   Vitals:   04/25/23 0635 04/25/23 0700 04/25/23 0800 04/25/23 0814  BP: 104/71 108/73 99/73   Pulse: 83 70 71   Resp: 15 12 12    Temp:    97.9 F (36.6 C)  TempSrc:    Axillary  SpO2: 97% 99% 99%   Weight:        Current vital signs: BP 110/70   Pulse 79   Temp 97.9 F (36.6 C) (Axillary)   Resp 14   Wt 68.1 kg   SpO2 98%   BMI 20.94 kg/m  Vital signs in last 24 hours: Temp:  [97.9 F (36.6 C)-100.5 F (38.1 C)] 97.9 F (36.6 C) (04/01 0814) Pulse Rate:  [70-106] 79 (04/01 0900) Resp:  [12-21] 14 (04/01 0900) BP: (96-148)/(64-96) 110/70 (04/01 0900) SpO2:  [97 %-100 %] 98 % (04/01 0900) Weight:  [68.1 kg] 68.1 kg (04/01 0500)  Body mass index is 20.94 kg/m.  Physical Exam   Constitutional: Appears well-developed and well-nourished.  Psych: Affect minimally interactive Eyes: No scleral injection.  HENT: No OP obstrucion.  Does not protrude tongue for examination Head: Normocephalic.  Cardiovascular: Normal rate and regular rhythm.  On monitor Respiratory: Effort normal, non-labored breathing.  GI: Soft.  No distension. There is no tenderness.    Neurologic Examination   Very sleepy but with repeated gentle noxious stimulation such as tickle will interact briefly with examiner.  Nonverbal.  Occasionally following some simple commands (squeezes and lets go with the left upper extremity)  Cranial nerves notable for significant nystagmus bilaterally.  Likely bilateral INO but difficult to be sure due to mental status.  Pupils equal round and  reactive to light.  Mild right facial droop.  Does not protrude tongue to command.  Sensory/motor using the left upper extremity more freely/spontaneously than the right upper extremity.  Withdraws in bilateral feet to tickle but more briskly on the left than the right.  Will not maintain any of his extremities antigravity (limited by mental status)  Medications  Current Facility-Administered Medications:    0.9 %  sodium chloride infusion, , Intravenous, Continuous, Adan Sis, PA-C, Last Rate: 40 mL/hr at 04/25/23 0700, Infusion Verify at 04/25/23 0700   acetaminophen (TYLENOL) suppository 650 mg, 650 mg, Rectal, Q6H PRN, Luciano Cutter, MD, 650 mg at 04/25/23 0155   cefTRIAXone (ROCEPHIN) 2 g in sodium chloride 0.9 % 100 mL IVPB, 2 g, Intravenous, Q12H, Chestine Spore, Laura P, DO   Chlorhexidine Gluconate Cloth 2 % PADS 6 each, 6 each, Topical, Daily, Marchelle Gearing, Murali, MD   docusate sodium (COLACE) capsule 100 mg, 100 mg, Oral, BID PRN, Arne Cleveland T, PA-C   enoxaparin (LOVENOX) injection 40 mg, 40 mg, Subcutaneous, Q24H, Latta, Marc T, PA-C   insulin aspart (novoLOG) injection 0-15 Units, 0-15 Units, Subcutaneous, Q4H, Luciano Cutter, MD   LORazepam (ATIVAN) injection 2 mg, 2 mg, Intravenous, Q1H PRN, Luciano Cutter, MD   Oral care mouth rinse, 15 mL, Mouth Rinse, 4 times per day, Kalman Shan, MD   Oral care mouth rinse, 15 mL, Mouth Rinse, PRN, Kalman Shan, MD   PHENObarbital (LUMINAL) injection 65 mg, 65 mg, Intravenous, BID, Luciano Cutter, MD   polyethylene glycol (MIRALAX / GLYCOLAX) packet 17 g, 17 g, Oral, Daily PRN, Arne Cleveland T, PA-C   vancomycin (VANCOREADY) IVPB 1500 mg/300 mL, 1,500 mg, Intravenous, Once, Daylene Posey, RPH   vancomycin (VANCOREADY) IVPB 750 mg/150 mL, 750 mg, Intravenous, Caffie Damme, [facility]  Labs and Diagnostic Imaging    Basic Metabolic Panel: Recent Labs  Lab 04/24/23 2052 04/25/23 0622  NA 141 140  K 4.3 3.5  CL 112*  110  CO2 20* 17*  GLUCOSE 122* 98  BUN 8 9  CREATININE 1.06 0.89  CALCIUM 8.0* 8.4*    CBC: Recent Labs  Lab 04/24/23 2052 04/25/23 0622  WBC 11.4* 21.5*  NEUTROABS 8.2*  --   HGB 14.6 14.5  HCT 45.7 43.7  MCV 100.4* 96.9  PLT 372 304    Coagulation Studies: No results for input(s): "LABPROT", "INR" in the last 72 hours.    Lipid Panel:  Lab Results  Component Value Date   LDLCALC 113 (H) 06/03/2022   HgbA1c:  Lab Results  Component Value Date   HGBA1C 5.5 08/22/2021   Urine Drug Screen:     Component Value Date/Time   LABOPIA NONE DETECTED 02/13/2023 1621   COCAINSCRNUR NONE DETECTED 02/13/2023 1621   LABBENZ POSITIVE (A) 02/13/2023 1621   AMPHETMU NONE DETECTED 02/13/2023 1621   THCU POSITIVE (A) 02/13/2023 1621   LABBARB NONE DETECTED 02/13/2023 1621    Alcohol Level     Component Value Date/Time  ETH <10 02/13/2023 1521   AED levels: Valproate 16 Component Ref Range & Units (hover) 1 d ago (04/24/23) 2 mo ago (02/15/23) 2 mo ago (02/13/23) 5 mo ago (11/12/22) 6 mo ago (10/08/22)  Valproic Acid Lvl 16 Low  68 CM <10 Low  CM 57 CM 80 CM      Latest Reference Range & Units 12/04/19 07:20 11/13/22 00:35 02/13/23 16:21 02/15/23 05:18 02/16/23 05:42  Ammonia 9 - 35 umol/L 22 46 (H) 42 (H) 82 (H) 24  (H): Data is abnormally high  CT Head without contrast(Personally reviewed): Other than a question of artifact in the left occipital area (common area for artifact, less likely subacute stroke) - no acute abnormality.  rEEG:  ABNORMALITY - Continuous slow, generalized and maximal left fronto-temporal  IMPRESSION: This study is suggestive of cortical dysfunction arising from lleft fronto-temporal region likely secondary to underlying structural abnormality, post-ictal state. Additionally there is moderate diffuse encephalopathy. No seizures or epileptiform discharges were seen throughout the recording.  Assessment   Zac ARDON FRANKLIN is a 33 y.o.  male presenting with breakthrough seizures likely in the setting of medication nonadherence again  Given his rapid, medical improvement and I do not suspect meningitis is playing a role here   Recommendations  S/p Phenobarbital load 20 mg/kg x 1 @ 2126; continue Phenobarb 65 mg BID for maintenance   Valproate level 16-likely due to noncompliance; will hold off at this time due to hyperammonemia in the past (alternatively if patient strongly prefers this medication would add L-carnitine) Topirimate level added on to assess adherence --will decide on dose to resume as able to assess adherence and as mental status improves enough to take p.o., no p.o. access at this time Other alternatives medications to consider will be Vimpat or Onfi Continue cEEG for now pending improvement in mental status Seizure precautions MR brain with and without contrast after clinical status improves further Low concern for meningitis due to rapid clinical improvement, appreciate antibiotics as needed for other potential systemic infections per CCM Appreciate management of comorbidities per CCM, discussed with CCM team via secure chat ______________________________________________________________________  Brooke Dare MD-PhD [facility] [phone] Available 7 AM to 7 PM, outside these hours please contact Neurologist on call listed on AMION   CRITICAL CARE Performed by: Gordy Councilman   Total critical care time: 35 minutes  Critical care time was exclusive of separately billable procedures and treating other patients.  Critical care was necessary to treat or prevent imminent or life-threatening deterioration.  Critical care was time spent personally by me on the following activities: development of treatment plan with patient and/or surrogate as well as nursing, discussions with consultants, evaluation of patient's response to treatment, examination of patient, obtaining history from patient  or surrogate, ordering and performing treatments and interventions, ordering and review of laboratory studies, ordering and review of radiographic studies, pulse oximetry and re-evaluation of patient's condition.

## 2023-04-25 NOTE — Progress Notes (Signed)
 vLTM maintenance  All impedances below 10k  No skin breakdown noted at  01 02 C3  P3

## 2023-04-25 NOTE — Progress Notes (Signed)
 Called to bedside for wound found on patient's right buttock.  On assessment, appears to be abscess on the inner right buttock. It is firm. There is a small amount of drainage. Difficult to discern if this is extending into the perirectal area or fistulous. Will get CT pelvis w/ contrast to rule out involvement of the sphincter before decision on I/D.

## 2023-04-25 NOTE — Progress Notes (Addendum)
 Initial Nutrition Assessment  DOCUMENTATION CODES:   Not applicable  INTERVENTION:   ADDENDUM (1215): Received secure chat from RN that NG tube to be placed today. Consult received for enteral nutrition initiation and management. Will order the tube feeding recommendations listed below.  - Recommend placement of NG tube at bedside and initiation of enteral nutrition; can exchange NG tube for Cortrak and bridle tomorrow, 04/26/23  Recommend initiation of enteral nutrition after enteral access obtained: - Start Osmolite 1.5 @ 20 mL/hr and advance rate by 10 mL/hr every 6 hours to goal rate of 60 mL/hr (1440 mL/day) - PROSource TF20 60 mL daily  Recommended tube feeding regimen at goal rate would provide 2240 kcal, 110 grams of protein, and 1097 ml of H2O.   - Once enteral access obtained, recommend 1 packet Juven BID per tube to support wound healing, each packet provides 95 calories, 2.5 grams of protein, and 9.8 grams of carbohydrate  NUTRITION DIAGNOSIS:   Inadequate oral intake related to lethargy/confusion as evidenced by NPO status.  GOAL:   Patient will meet greater than or equal to 90% of their needs  MONITOR:   Diet advancement, Labs, Weight trends, Skin  REASON FOR ASSESSMENT:   Other (Comment) (Cortrak list)    ASSESSMENT:   33 year old male who presented to the ED on 3/31 after a seizure. PMH of multiple sclerosis, complicated epilepsy with multiple admissions for breakthrough seizures due to noncompliance with medications. Pt admitted with status epilepticus.  Discussed pt with RN at bedside. Also discussed options for enteral access with PCCM via Secure Chat. Pt currently on continuous EEG and at risk for progressing towards intubation especially if he requires escalation in anti-epileptic therapy.  Pt with findings of mild subcutaneous fat and mild muscle depletions on NFPE. Pt's weight has fluctuated between 62-74.1 kg over the last 10 months. Suspect outlier  weights are inaccurate. Pt weighed 74.1 kg on 11/24/22, 62.8 kg on 02/17/23, and 64.3 kg on 03/09/23. Current weight documented as 68.1 kg. If weight on 11/24/22 is accurate, pt has experienced a 6 kg or 8.1% weight loss in 5 months which is not clinically significant for timeframe. Pt at risk for developing malnutrition.  Per discussion with RN, pt may require intubation for airway protection. If does not require intubation, recommend placement of NG tube at bedside and initiation of enteral nutrition. NG tube can be exchanged for Cortrak and bridle tomorrow, 04/26/23.  Pt with 2 stage II pressure injuries to coccyx and would benefit from Juven per tube to support wound healing.  Medications reviewed and include: SSI every 4 hours, IV phenobarbital IVF: NS @ 40 mL/hr  Labs reviewed: chloride 112, CO2 20, calcium 8.0, WBC 11.4 CBG's: 98-109  NUTRITION - FOCUSED PHYSICAL EXAM:  Flowsheet Row Most Recent Value  Orbital Region No depletion  Upper Arm Region Mild depletion  Thoracic and Lumbar Region Mild depletion  Buccal Region No depletion  Temple Region No depletion  Clavicle Bone Region Mild depletion  Clavicle and Acromion Bone Region Mild depletion  Scapular Bone Region Mild depletion  Dorsal Hand No depletion  Patellar Region No depletion  Anterior Thigh Region Mild depletion  Posterior Calf Region No depletion  Edema (RD Assessment) None  Hair Reviewed  Eyes Reviewed  Mouth Reviewed  Skin Reviewed  Nails Reviewed    Diet Order:   Diet Order             Diet NPO time specified  Diet effective now  EDUCATION NEEDS:   Not appropriate for education at this time  Skin:  Skin Assessment: Skin Integrity Issues: Stage II: mid coccyx, left coccyx  Last BM:  no documented BM  Height:   Ht Readings from Last 1 Encounters:  03/09/23 5\' 11"  (1.803 m)    Weight:   Wt Readings from Last 1 Encounters:  04/25/23 68.1 kg    BMI:  Body mass index  is 20.94 kg/m.  Estimated Nutritional Needs:   Kcal:  2200-2400  Protein:  100-120 grams  Fluid:  >2.2 L    Mertie Clause, MS, RD, LDN Registered Dietitian II Please see AMiON for contact information.

## 2023-04-25 NOTE — Procedures (Signed)
 Patient Name: KHIRY PASQUARIELLO  MRN: [id number]  Epilepsy Attending: Charlsie Quest  Referring Physician/Provider: Milon Dikes, MD  Duration: 04/24/2023 2243 to 04/26/2023 0115  Patient history: 33 y.o. male past medical history of MS and seizures with noncompliance to medications coming in with multiple breakthrough seizures without return to baseline- status epilepticus. EEG to evaluate for seizure  Level of alertness:  lethargic   AEDs during EEG study: Phenobarb  Technical aspects: This EEG study was done with scalp electrodes positioned according to the 10-20 International system of electrode placement. Electrical activity was reviewed with band pass filter of 1-70Hz , sensitivity of 7 uV/mm, display speed of 24mm/sec with a 60Hz  notched filter applied as appropriate. EEG data were recorded continuously and digitally stored.  Video monitoring was available and reviewed as appropriate.  Description: EEG showed continuous generalized and maximal left fronto-temporal 3 to 6 Hz theta-delta slowing admixed with 13-15Hz  beta activity. Hyperventilation and photic stimulation were not performed.     EEG was disconnected between 04/25/2023 0045 to 0216.   ABNORMALITY - Continuous slow, generalized and maximal left fronto-temporal   IMPRESSION: This study is suggestive of cortical dysfunction arising from lleft fronto-temporal region likely secondary to underlying structural abnormality, post-ictal state. Additionally there is moderate diffuse encephalopathy. No seizures or epileptiform discharges were seen throughout the recording.  Jorge Amparo [name]

## 2023-04-25 NOTE — Progress Notes (Signed)
 NAME:  Miguel Black, MRN:  [id number], DOB:  1990-03-10, LOS: 0 ADMISSION DATE:  04/24/2023, CONSULTATION DATE:  04/24/2023 REFERRING MD:  Glyn Ade, CHIEF COMPLAINT:  Seizure  History of Present Illness:  Miguel Black is a 33 year old gentleman with known history for multiple sclerosis and seizures disorder.  Patient participated in physical therapy this a.m. secondary to known foot drop for increase available extremity strength and balance of flexibility.  At the end of PT session, patient had noted fatigue otherwise neurologically intact.  However this p.m. approximately 19:00 patient had a seizure at home and was administered intranasal diazepam by his family.  This was followed by a seizure for which the EMS services was called.  It is noted that after the EMS team arrived the patient continued to have seizures and was given 10 mg IM midazolam that did not immediately break break seizure activity until approximately 15 minutes later.  Neurology team was called and got a phenobarbital load.  In the ED lactic acid level returned at 16, patient presumed noncompliant with antiseizure medication.  Patient remains difficult to arouse therefore airway protection was provided via nasal trumpet.  Neurology tech present placing EEG leads.  CBC: WBC slight leukocytosis at 11.4, BMP within normal limits exception of CO2 28 glucose 122.  CT scan of the head without contrast reveals no acute abnormality neurology team following. CXR without acute pulmonary disease.  PCCM called to ED to evaluate and admit patient for continuous EEG monitoring. Neurology following..   Pertinent  Medical History  Acute encephalopathy (04/26/2022), Acute metabolic encephalopathy (04/26/2022), Acute respiratory failure with hypoxia (HCC) (08/23/2021), Aspiration pneumonia (HCC) (07/12/2018), Closed fracture of distal phalanx or phalanges of hand (02/05/2009), Encounter for screening for HIV (07/12/2018), Foot drop  (06/03/2022), Marijuana abuse (05/01/2013), MS (multiple sclerosis) (HCC), Seizure (HCC), Seizures (HCC) (01/22/2019), and Status epilepticus (HCC) (07/12/2018).   Significant Hospital Events: Including procedures, antibiotic start and stop dates in addition to other pertinent events   Phenobarbital/Depacon load in the ED, EEG continuous monitoring started     Interval history:   Tmax 100.5.  Objective   Blood pressure 99/73, pulse 71, temperature 97.9 F (36.6 C), temperature source Axillary, resp. rate 12, weight 68.1 kg, SpO2 99%.        Intake/Output Summary (Last 24 hours) at 04/25/2023 0907 Last data filed at 04/25/2023 0700 Gross per 24 hour  Intake 213.79 ml  Output --  Net 213.79 ml   Filed Weights   04/25/23 0500  Weight: 68.1 kg    Examination: Physical exam General: ill appearing man lying in bed sleeping Lungs: breathing comfortably on RA, CTAB Cardiac: S1S2, RRR Abdomen: soft, NT Skin: warm, dry, no diffuse rashes Neuro: arouses to significant stimulation, tracks, but falls back asleep within seconds. Not following commands.  GU: foley catheter   Bicarb 17, AG 13 WBC 21.5 H/H 14.5/43.7 Platelets 304    Resolved Hospital Problem list   N/A  Assessment & Plan:  Seizure, status epilepticus-- suspect subtherapeutic valproate as cause -cEEG per neurology -holding PTA depakote & topamax due to elevated ammonia -con't phenobarbital per neurology -eventually needs brain MRI -Rule out precipitating causes of seizures, worry with fever about possibility of infection; low suspicion for meningitis if he was nor encephalophatic prior to seizure. -at risk for progressing towards intubation, especially if he requires escalation in anti-epileptic therapy. -needs NGT  Multiple sclerosis, chronic gait disturbance due to foot drop -follows OP with [facility], on Ocrevus IV every 6 months -  eventually needs PT, OT. Uses cane to walke.  Leukocytosis -blood  cultures, empiric antibiotics   Sacral decubitus ulcer, present on admission -wound care -frequent turns  NAGMA -check LA  Called mother to provide update, left a message.  Best Practice (right click and "Reselect all SmartList Selections" daily)   Diet/type: NPO DVT prophylaxis LMWH Pressure ulcer(s): present on admission - sacral GI prophylaxis: N/A Lines: N/A Foley:  N/A Code Status:  full code Last date of multidisciplinary goals of care discussion []   Labs   CBC: Recent Labs  Lab 04/24/23 2052 04/25/23 0622  WBC 11.4* 21.5*  NEUTROABS 8.2*  --   HGB 14.6 14.5  HCT 45.7 43.7  MCV 100.4* 96.9  PLT 372 304    Basic Metabolic Panel: Recent Labs  Lab 04/24/23 2052 04/25/23 0622  NA 141 140  K 4.3 3.5  CL 112* 110  CO2 20* 17*  GLUCOSE 122* 98  BUN 8 9  CREATININE 1.06 0.89  CALCIUM 8.0* 8.4*   GFR: Estimated Creatinine Clearance: 115.8 mL/min (by C-G formula based on SCr of 0.89 mg/dL). Recent Labs  Lab 04/24/23 2052 04/25/23 0622  WBC 11.4* 21.5*  LATICACIDVEN  --  1.9    Liver Function Tests: Recent Labs  Lab 04/24/23 2052  AST 17  ALT 14  ALKPHOS 48  BILITOT 0.5  PROT 6.7  ALBUMIN 3.8     Critical care time:     This patient is critically ill with multiple organ system failure which requires frequent high complexity decision making, assessment, support, evaluation, and titration of therapies. This was completed through the application of advanced monitoring technologies and extensive interpretation of multiple databases. During this encounter critical care time was devoted to patient care services described in this note for 35 minutes.  Steffanie Dunn, DO 04/25/23 9:10 AM [facility]  For contact information, see Amion. If no response to pager, please call PCCM consult pager. After hours, 7PM- 7AM, please call Elink.

## 2023-04-25 NOTE — Progress Notes (Signed)
 Pharmacy Antibiotic Note  Miguel Black is a 33 y.o. male admitted on 04/24/2023 for seizure, leukocytosis and concern for sepsis.  Pharmacy has been consulted for vancomycin dosing.  Ceftriaxone change to zosyn to cover for perirectal wound.  Scr<1 Plan: Cont vancomycin 750 mg IV q 8h (eAUC 461) Zosyn 3.375g IV q8 Vancomycin levels as indicated   Weight: 68.1 kg (150 lb 2.1 oz)  Temp (24hrs), Avg:99 F (37.2 C), Min:97.9 F (36.6 C), Max:100.5 F (38.1 C)  Recent Labs  Lab 04/24/23 2052 04/25/23 0622 04/25/23 1001 04/25/23 1229  WBC 11.4* 21.5*  --   --   CREATININE 1.06 0.89  --   --   LATICACIDVEN  --  1.9 1.7 1.3    Estimated Creatinine Clearance: 115.8 mL/min (by C-G formula based on SCr of 0.89 mg/dL).    Allergies  Allergen Reactions   Ampyra [Dalfampridine] Other (See Comments)    Seizure 02/2021 while on dalfampridine     Ulyses Southward, PharmD, BCIDP, AAHIVP, CPP Infectious Disease Pharmacist 04/25/2023 5:33 PM

## 2023-04-25 NOTE — Plan of Care (Signed)
 Patient continues to maintain airway. No signs/symptoms of new seizures noted overnight. Still lethargic. When aroused around 0544, patient started to spontaneously open eyes, minimally follow commands such as weakly sticking tongue out and squeezing hand. However, unable to sustain attention. No family at bedside to update plan of care. ICU status maintained.     Problem: Clinical Measurements: Goal: Ability to maintain clinical measurements within normal limits will improve Outcome: Progressing   Problem: Activity: Goal: Risk for activity intolerance will decrease Outcome: Not Progressing   Problem: Nutrition: Goal: Adequate nutrition will be maintained Outcome: Not Progressing

## 2023-04-26 ENCOUNTER — Inpatient Hospital Stay (HOSPITAL_COMMUNITY)

## 2023-04-26 ENCOUNTER — Ambulatory Visit: Admitting: Internal Medicine

## 2023-04-26 DIAGNOSIS — R1311 Dysphagia, oral phase: Secondary | ICD-10-CM | POA: Diagnosis not present

## 2023-04-26 DIAGNOSIS — L03317 Cellulitis of buttock: Secondary | ICD-10-CM | POA: Diagnosis not present

## 2023-04-26 DIAGNOSIS — G9340 Encephalopathy, unspecified: Secondary | ICD-10-CM | POA: Diagnosis not present

## 2023-04-26 DIAGNOSIS — R569 Unspecified convulsions: Secondary | ICD-10-CM | POA: Diagnosis not present

## 2023-04-26 LAB — GLUCOSE, CAPILLARY
Glucose-Capillary: 102 mg/dL — ABNORMAL HIGH (ref 70–99)
Glucose-Capillary: 104 mg/dL — ABNORMAL HIGH (ref 70–99)
Glucose-Capillary: 104 mg/dL — ABNORMAL HIGH (ref 70–99)
Glucose-Capillary: 114 mg/dL — ABNORMAL HIGH (ref 70–99)
Glucose-Capillary: 119 mg/dL — ABNORMAL HIGH (ref 70–99)
Glucose-Capillary: 133 mg/dL — ABNORMAL HIGH (ref 70–99)
Glucose-Capillary: 98 mg/dL (ref 70–99)

## 2023-04-26 LAB — COMPREHENSIVE METABOLIC PANEL WITH GFR
ALT: 11 U/L (ref 0–44)
AST: 12 U/L — ABNORMAL LOW (ref 15–41)
Albumin: 3.5 g/dL (ref 3.5–5.0)
Alkaline Phosphatase: 45 U/L (ref 38–126)
Anion gap: 11 (ref 5–15)
BUN: 9 mg/dL (ref 6–20)
CO2: 17 mmol/L — ABNORMAL LOW (ref 22–32)
Calcium: 8.3 mg/dL — ABNORMAL LOW (ref 8.9–10.3)
Chloride: 110 mmol/L (ref 98–111)
Creatinine, Ser: 1.26 mg/dL — ABNORMAL HIGH (ref 0.61–1.24)
GFR, Estimated: >60 mL/min (ref >60)
Glucose, Bld: 108 mg/dL — ABNORMAL HIGH (ref 70–99)
Potassium: 3.5 mmol/L (ref 3.5–5.1)
Sodium: 138 mmol/L (ref 135–145)
Total Bilirubin: 0.6 mg/dL (ref 0.0–1.2)
Total Protein: 6.6 g/dL (ref 6.5–8.1)

## 2023-04-26 LAB — MAGNESIUM
Magnesium: 2.1 mg/dL (ref 1.7–2.4)
Magnesium: 2 mg/dL (ref 1.7–2.4)

## 2023-04-26 LAB — PHOSPHORUS
Phosphorus: 2.3 mg/dL — ABNORMAL LOW (ref 2.5–4.6)
Phosphorus: 2.8 mg/dL (ref 2.5–4.6)

## 2023-04-26 LAB — TOPIRAMATE LEVEL: Topiramate Lvl: 1.7 ug/mL — ABNORMAL LOW (ref 2.0–25.0)

## 2023-04-26 MED ORDER — VALPROATE SODIUM 100 MG/ML IV SOLN
500.0000 mg | Freq: Once | INTRAVENOUS | Status: AC
  Administered 2023-04-26: 500 mg via INTRAVENOUS
  Filled 2023-04-26: qty 5

## 2023-04-26 MED ORDER — LEVOCARNITINE 1 GM/10ML PO SOLN
500.0000 mg | Freq: Two times a day (BID) | ORAL | Status: DC
  Administered 2023-04-26: 500 mg
  Filled 2023-04-26: qty 5

## 2023-04-26 MED ORDER — DIVALPROEX SODIUM 250 MG PO DR TAB
250.0000 mg | DELAYED_RELEASE_TABLET | Freq: Three times a day (TID) | ORAL | Status: DC
  Administered 2023-04-26 – 2023-04-27 (×3): 250 mg via ORAL
  Filled 2023-04-26 (×4): qty 1

## 2023-04-26 MED ORDER — LEVOCARNITINE 1 GM/10ML PO SOLN
500.0000 mg | Freq: Two times a day (BID) | ORAL | Status: DC
  Administered 2023-04-26 – 2023-05-01 (×10): 500 mg via ORAL
  Filled 2023-04-26 (×12): qty 5

## 2023-04-26 MED ORDER — FREE WATER
200.0000 mL | Status: DC
  Administered 2023-04-26 (×2): 200 mL

## 2023-04-26 MED ORDER — ENSURE ENLIVE PO LIQD
237.0000 mL | Freq: Two times a day (BID) | ORAL | Status: DC
  Administered 2023-04-26 – 2023-05-01 (×10): 237 mL via ORAL

## 2023-04-26 MED ORDER — ADULT MULTIVITAMIN W/MINERALS CH
1.0000 | ORAL_TABLET | Freq: Every day | ORAL | Status: DC
  Administered 2023-04-26 – 2023-05-01 (×6): 1 via ORAL
  Filled 2023-04-26 (×6): qty 1

## 2023-04-26 MED ORDER — POTASSIUM CHLORIDE 20 MEQ PO PACK
40.0000 meq | PACK | Freq: Once | ORAL | Status: AC
  Administered 2023-04-26: 40 meq
  Filled 2023-04-26: qty 2

## 2023-04-26 NOTE — Progress Notes (Signed)
 [facility] ADULT ICU REPLACEMENT PROTOCOL   The patient does apply for the [facility] Adult ICU Electrolyte Replacment Protocol based on the criteria listed below:   1.Exclusion criteria: TCTS, ECMO, Dialysis, and Myasthenia Gravis patients 2. Is GFR >/= 30 ml/min? Yes.    Patient's GFR today is >60 3. Is SCr </= 2? Yes.   Patient's SCr is 1.26 mg/dL 4. Did SCr increase >/= 0.5 in 24 hours? No. 5.Pt's weight >40kg  Yes.   6. Abnormal electrolyte(s): K + = 3.5  7. Electrolytes replaced per protocol 8.  Call MD STAT for K+ </= 2.5, Phos </= 1, or Mag </= 1 Physician:  Delia Chimes, eMD   Faye Ramsay 04/26/2023 5:26 AM

## 2023-04-26 NOTE — Plan of Care (Signed)
  Problem: Clinical Measurements: Goal: Ability to maintain clinical measurements within normal limits will improve Outcome: Progressing Goal: Respiratory complications will improve Outcome: Progressing Goal: Cardiovascular complication will be avoided Outcome: Progressing   Problem: Activity: Goal: Risk for activity intolerance will decrease Outcome: Progressing   Problem: Nutrition: Goal: Adequate nutrition will be maintained Outcome: Progressing   Problem: Elimination: Goal: Will not experience complications related to urinary retention Outcome: Progressing

## 2023-04-26 NOTE — Plan of Care (Signed)
  Problem: Education: Goal: Knowledge of General Education information will improve Description: Including pain rating scale, medication(s)/side effects and non-pharmacologic comfort measures Outcome: Not Progressing   Problem: Health Behavior/Discharge Planning: Goal: Ability to manage health-related needs will improve Outcome: Not Progressing   Problem: Clinical Measurements: Goal: Ability to maintain clinical measurements within normal limits will improve Outcome: Not Progressing Goal: Will remain free from infection Outcome: Progressing Goal: Diagnostic test results will improve Outcome: Progressing Goal: Respiratory complications will improve Outcome: Progressing Goal: Cardiovascular complication will be avoided Outcome: Progressing   Problem: Activity: Goal: Risk for activity intolerance will decrease Outcome: Progressing   Problem: Nutrition: Goal: Adequate nutrition will be maintained Outcome: Progressing   Problem: Coping: Goal: Level of anxiety will decrease Outcome: Not Progressing   Problem: Elimination: Goal: Will not experience complications related to bowel motility Outcome: Progressing Goal: Will not experience complications related to urinary retention Outcome: Progressing   Problem: Pain Managment: Goal: General experience of comfort will improve and/or be controlled Outcome: Progressing   Problem: Safety: Goal: Ability to remain free from injury will improve Outcome: Not Progressing   Problem: Skin Integrity: Goal: Risk for impaired skin integrity will decrease Outcome: Progressing   Problem: Education: Goal: Ability to describe self-care measures that may prevent or decrease complications (Diabetes Survival Skills Education) will improve Outcome: Not Progressing Goal: Individualized Educational Video(s) Outcome: Not Progressing   Problem: Coping: Goal: Ability to adjust to condition or change in health will improve Outcome: Not  Progressing   Problem: Fluid Volume: Goal: Ability to maintain a balanced intake and output will improve Outcome: Not Progressing   Problem: Health Behavior/Discharge Planning: Goal: Ability to identify and utilize available resources and services will improve Outcome: Not Progressing Goal: Ability to manage health-related needs will improve Outcome: Not Progressing   Problem: Metabolic: Goal: Ability to maintain appropriate glucose levels will improve Outcome: Progressing   Problem: Nutritional: Goal: Maintenance of adequate nutrition will improve Outcome: Progressing Goal: Progress toward achieving an optimal weight will improve Outcome: Not Progressing   Problem: Skin Integrity: Goal: Risk for impaired skin integrity will decrease Outcome: Progressing   Problem: Tissue Perfusion: Goal: Adequacy of tissue perfusion will improve Outcome: Progressing   Problem: Safety: Goal: Non-violent Restraint(s) Outcome: Completed/Met

## 2023-04-26 NOTE — Progress Notes (Addendum)
 0720 patient alert to self and place repeats things when asked not answering all questions patient in mitts and posey belt has tube feeding running  0800 Tube feeding increased per orders  0830 patient tolerated liquids with bedside swallow but needed to be reminded to swallow and to close moth to drink speech consulted   1000 patient passed swallow with speech will need assistance with meals at this time  1145 NG tube removed and restraints taken off EEG discontinued  1230 patient up to chair with therapy, patient able to feed self with spoon up in chair   1430 patient hard to redirect to do things he repeats "Im not allowed to do it" max assist with walker back to bed patient falling backward as hr turned to get to bed  1700 patient not wanting to get in chair to eat he stats "Im not allowed to do it" and will not participate in turn or getting up to chair. Patient does move self in bed from side to side even lays on his abdomen.

## 2023-04-26 NOTE — Progress Notes (Addendum)
 eLink Physician-Brief Progress Note Patient Name: HARMON BOMMARITO DOB: Dec 24, 1990 MRN: [id number]   Date of Service  04/26/2023  HPI/Events of Note  Patient with oliguria and concern for urinary retention.  Bladder scan at the start of shift was 320 cc.  IV fluids expired.  eICU Interventions  Standard retention protocol, In-N-Out catheterization as needed   0235 -add Posey belt for patient safety      Cortlyn Cannell 04/26/2023, 12:46 AM

## 2023-04-26 NOTE — Progress Notes (Signed)
 Nutrition Follow-up  DOCUMENTATION CODES:   Not applicable  INTERVENTION:   Ensure Enlive po BID, each supplement provides 350 kcal and 20 grams of protein.  Magic cup TID with meals, each supplement provides 290 kcal and 9 grams of protein  D/C Juven for now while on nectar thickened liquids  MVI with minerals   NUTRITION DIAGNOSIS:   Inadequate oral intake related to acute illness as evidenced by meal completion < 50%. Ongoing.   GOAL:   Patient will meet greater than or equal to 90% of their needs Progressing with diet advancement   MONITOR:   PO intake, Supplement acceptance  REASON FOR ASSESSMENT:   Consult Wound healing  ASSESSMENT:   33 year old male who presented to the ED on 3/31 after a seizure. PMH of multiple sclerosis, complicated epilepsy with multiple admissions for breakthrough seizures due to noncompliance with medications. Pt admitted with status epilepticus.  Pt extubated, seen by SLP and started on PO diet, NG removed.  Pt unable to answer any questions. CIR consult pending. No family at bedside.   Medications reviewed and include: SSI, juven,   Labs reviewed:  CBG's: 97-133   Diet Order:   Diet Order             DIET DYS 3 Room service appropriate? Yes; Fluid consistency: Nectar Thick  Diet effective now                   EDUCATION NEEDS:   Not appropriate for education at this time  Skin:  Skin Assessment: Skin Integrity Issues: Skin Integrity Issues:: Stage III Stage II: NA Stage III: coccyx, and buttocks  Last BM:  no documented BM  Height:   Ht Readings from Last 1 Encounters:  03/09/23 5\' 11"  (1.803 m)    Weight:   Wt Readings from Last 1 Encounters:  04/26/23 66.8 kg    BMI:  Body mass index is 20.54 kg/m.  Estimated Nutritional Needs:   Kcal:  2200-2400  Protein:  100-120 grams  Fluid:  >2.2 L  Keylah Darwish P., RD, LDN, CNSC See AMiON for contact information

## 2023-04-26 NOTE — Progress Notes (Signed)
  Inpatient Rehab Admissions Coordinator :  Per therapy recommendations, patient was screened for CIR candidacy by Ottie Glazier RN MSN.  At this time patient appears to be a potential candidate for CIR. I will place a rehab consult per protocol for full assessment. Please call me with any questions.  Ottie Glazier RN MSN Admissions Coordinator [phone]

## 2023-04-26 NOTE — Procedures (Addendum)
 Patient Name: Miguel Black  MRN: [id number]  Epilepsy Attending: Charlsie Quest  Referring Physician/Provider: Milon Dikes, MD  Duration: 04/26/2023 0115 to 04/26/2023 1117   Patient history: 33 y.o. male past medical history of MS and seizures with noncompliance to medications coming in with multiple breakthrough seizures without return to baseline- status epilepticus. EEG to evaluate for seizure   Level of alertness:  lethargic    AEDs during EEG study: Phenobarb   Technical aspects: This EEG study was done with scalp electrodes positioned according to the 10-20 International system of electrode placement. Electrical activity was reviewed with band pass filter of 1-70Hz , sensitivity of 7 uV/mm, display speed of 32mm/sec with a 60Hz  notched filter applied as appropriate. EEG data were recorded continuously and digitally stored.  Video monitoring was available and reviewed as appropriate.   Description: The posterior dominant rhythm consists of 8-9 Hz activity of moderate voltage (25-35 uV) seen predominantly in posterior head regions, symmetric and reactive to eye opening and eye closing. Sleep was characterized by sleep spindles (12-14hz ), maximal fronto-central region. EEG showed continuous left fronto-temporal 3 to 6 Hz theta-delta slowing admixed with 13-15Hz  beta activity. Intermittent generalized 3-6hz  theta-delta slowing was also noted. Sharp waves were noted in left fronto-temporal region Hyperventilation and photic stimulation were not performed.       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  Additionally there is cortical dysfunction arising from left fronto-temporal region likely secondary to underlying structural abnormality, post-ictal state. Lastly there is mild  diffuse encephalopathy. No seizures were seen throughout the recording.   Kyrin Gratz [name]

## 2023-04-26 NOTE — Progress Notes (Signed)
vLTM discontinued.  [facility] notified.  No skin breakdown noted at all skin sites. ?

## 2023-04-26 NOTE — Evaluation (Signed)
 Occupational Therapy Evaluation Patient Details Name: Miguel Black MRN: [id number] DOB: 1990/11/16 Today's Date: 04/26/2023   History of Present Illness   Pt is a 33 y/o male admitted 04/25/23 after a seizure at home with family. CT head did not reveal acute intracranial abnormality. CXR was negative for acute pulmonary disease. Dx with acute encephalopathy. PMH includes MS, seizures. Of note: hospitalized 02/13/23-02/17/23.     Clinical Impressions Pt is typically mod I for ambulation with SPC, and had been going to OPPT. Assume he was independent in ADL (but unsure of functional level since last admission) Today pt presents with deficits in balance, activity tolerance, cognition, and requires extra assist in ADL and functional transfers. Pt was set up for self-feeding/grooming in seated position, min A +2 for transfers step pivot, max A for LB ADL, min A for UB ADL. He was very pleasant and willing to work with therapy, per chart review he has 24/7 caregiver assist. At this time recommending rehab of >3 hours daily to maximize safety and independence in ADL and functional transfers. Next session to focus on access to LB, vision?, and continued assessment of cognition.      If plan is discharge home, recommend the following:   A little help with walking and/or transfers;A lot of help with bathing/dressing/bathroom;Assist for transportation;Help with stairs or ramp for entrance;Supervision due to cognitive status     Functional Status Assessment   Patient has had a recent decline in their functional status and demonstrates the ability to make significant improvements in function in a reasonable and predictable amount of time.     Equipment Recommendations   Other (comment) (defer to next venue of care)     Recommendations for Other Services   PT consult;Rehab consult;Speech consult     Precautions/Restrictions   Precautions Precautions: Other (comment) (Seizure) Recall of  Precautions/Restrictions: Impaired Restrictions Weight Bearing Restrictions Per Provider Order: No     Mobility Bed Mobility Overal bed mobility: Needs Assistance Bed Mobility: Supine to Sit     Supine to sit: Contact guard, HOB elevated, +2 for safety/equipment     General bed mobility comments: line management, no physical assist,    Transfers Overall transfer level: Needs assistance Equipment used: 2 person hand held assist Transfers: Sit to/from Stand, Bed to chair/wheelchair/BSC Sit to Stand: Min assist, +2 safety/equipment     Step pivot transfers: Min assist, +2 safety/equipment     General transfer comment: braces against the bed, and pulls up on therapists arms.      Balance Overall balance assessment: Needs assistance Sitting-balance support: Single extremity supported, Feet supported Sitting balance-Leahy Scale: Fair Sitting balance - Comments: LOB x2 when trying to don socks requiring therapist assist to correct   Standing balance support: Bilateral upper extremity supported, During functional activity Standing balance-Leahy Scale: Poor                             ADL either performed or assessed with clinical judgement   ADL Overall ADL's : Needs assistance/impaired Eating/Feeding: Set up;Sitting Eating/Feeding Details (indicate cue type and reason): Pt able to self-feed with spoon. red tubing handle in room - but as of now Pt did not need Grooming: Set up;Sitting;Cueing for sequencing   Upper Body Bathing: Moderate assistance Upper Body Bathing Details (indicate cue type and reason): for back Lower Body Bathing: Moderate assistance Lower Body Bathing Details (indicate cue type and reason): knees down Upper Body Dressing :  Minimal assistance   Lower Body Dressing: Maximal assistance Lower Body Dressing Details (indicate cue type and reason): donning socks - Pt attempted and had LOB x3 Toilet Transfer: Minimal assistance;+2 for  safety/equipment;Stand-pivot   Toileting- Clothing Manipulation and Hygiene: Maximal assistance;Sit to/from stand Toileting - Clothing Manipulation Details (indicate cue type and reason): currently with condom cath     Functional mobility during ADLs: Minimal assistance;+2 for safety/equipment;Cueing for sequencing (strong brace against the bed)       Vision Patient Visual Report: No change from baseline Additional Comments: disconjusgate gaze noted. - unsure of baseline     Perception         Praxis         Pertinent Vitals/Pain Pain Assessment Pain Assessment: Faces Faces Pain Scale: No hurt Pain Intervention(s): Monitored during session, Repositioned     Extremity/Trunk Assessment Upper Extremity Assessment Upper Extremity Assessment: Overall WFL for tasks assessed   Lower Extremity Assessment Lower Extremity Assessment: Defer to PT evaluation   Cervical / Trunk Assessment Cervical / Trunk Assessment: Normal   Communication Communication Communication: No apparent difficulties;Other (comment) (unsure of baseline communication) Factors Affecting Communication: Difficulty expressing self   Cognition Arousal: Alert Behavior During Therapy: WFL for tasks assessed/performed Cognition: No family/caregiver present to determine baseline             OT - Cognition Comments: Pt following directions, perseverates on certain details, has a tendency to agree with therapy team "Are you 33 years old?..Marland KitchenMarland KitchenYes I am 33 years old" lots of redirection throughout session                 Following commands: Impaired       Cueing  General Comments   Cueing Techniques: Verbal cues;Gestural cues;Tactile cues  VSS throughout session   Exercises     Shoulder Instructions      Home Living Family/patient expects to be discharged to:: Private residence Living Arrangements: Parent;Other relatives Available Help at Discharge: Available 24 hours/day;Family   Home  Access: Stairs to enter Entrance Stairs-Number of Steps: 7 Entrance Stairs-Rails: Right;Left;Can reach both Home Layout: Two level;Bed/bath upstairs Alternate Level Stairs-Number of Steps: flight Alternate Level Stairs-Rails: Left Bathroom Shower/Tub: Tub/shower unit;Door   [employer] Toilet: Standard     Home Equipment: Shower seat;Cane - Programmer, applications (2 wheels);Grab bars - tub/shower;Wheelchair - manual;Cane - single point;BSC/3in1   Additional Comments: Mother works from home. - Home set up taken from previous admission      Prior Functioning/Environment Prior Level of Function : Needs assist;Patient poor historian/Family not available             Mobility Comments: Pt going to OPPT, walks with SPC? ADLs Comments: had been independent prior to Jan admission, unsure of independence since then    OT Problem List: Decreased strength;Decreased activity tolerance;Impaired balance (sitting and/or standing);Decreased safety awareness   OT Treatment/Interventions: Self-care/ADL training;Therapeutic exercise;DME and/or AE instruction;Therapeutic activities;Patient/family education;Balance training      OT Goals(Current goals can be found in the care plan section)   Acute Rehab OT Goals Patient Stated Goal: eat lunch OT Goal Formulation: With patient Time For Goal Achievement: 05/10/23 Potential to Achieve Goals: Good   OT Frequency:  Min 2X/week    Co-evaluation              AM-PAC OT "6 Clicks" Daily Activity     Outcome Measure Help from another person eating meals?: A Little Help from another person taking care of personal grooming?: A Little Help  from another person toileting, which includes using toliet, bedpan, or urinal?: A Lot Help from another person bathing (including washing, rinsing, drying)?: A Lot Help from another person to put on and taking off regular upper body clothing?: A Little Help from another person to put on and taking off regular  lower body clothing?: A Lot 6 Click Score: 15   End of Session Equipment Utilized During Treatment: Gait belt Nurse Communication: Mobility status;Precautions  Activity Tolerance: Patient tolerated treatment well Patient left: in chair;with call bell/phone within reach;with chair alarm set;with nursing/sitter in room  OT Visit Diagnosis: Unsteadiness on feet (R26.81);Muscle weakness (generalized) (M62.81)                Time: 1324-4010 OT Time Calculation (min): 32 min Charges:  OT General Charges $OT Visit: 1 Visit OT Evaluation $OT Eval Moderate Complexity: 1 Mod Nyoka Cowden OTR/L Acute Rehabilitation Services Office: [phone]  Emelda Fear 04/26/2023, 2:20 PM

## 2023-04-26 NOTE — Progress Notes (Signed)
 NAME:  Miguel Black, MRN:  [id number], DOB:  May 05, 1990, LOS: 1 ADMISSION DATE:  04/24/2023, CONSULTATION DATE:  04/24/2023 REFERRING MD:  Miguel Black, CHIEF COMPLAINT:  Seizure  History of Present Illness:  Miguel Black is a 33 year old gentleman with known history for multiple sclerosis and seizures disorder.  Patient participated in physical therapy this a.m. secondary to known foot drop for increase available extremity strength and balance of flexibility.  At the end of PT session, patient had noted fatigue otherwise neurologically intact.  However this p.m. approximately 19:00 patient had a seizure at home and was administered intranasal diazepam by his family.  This was followed by a seizure for which the EMS services was called.  It is noted that after the EMS team arrived the patient continued to have seizures and was given 10 mg IM midazolam that did not immediately break break seizure activity until approximately 15 minutes later.  Neurology team was called and got a phenobarbital load.  In the ED lactic acid level returned at 16, patient presumed noncompliant with antiseizure medication.  Patient remains difficult to arouse therefore airway protection was provided via nasal trumpet.  Neurology tech present placing EEG leads.  CBC: WBC slight leukocytosis at 11.4, BMP within normal limits exception of CO2 28 glucose 122.  CT scan of the head without contrast reveals no acute abnormality neurology team following. CXR without acute pulmonary disease.  PCCM called to ED to evaluate and admit patient for continuous EEG monitoring. Neurology following..   Pertinent  Medical History  Acute encephalopathy (04/26/2022), Acute metabolic encephalopathy (04/26/2022), Acute respiratory failure with hypoxia (HCC) (08/23/2021), Aspiration pneumonia (HCC) (07/12/2018), Closed fracture of distal phalanx or phalanges of hand (02/05/2009), Encounter for screening for HIV (07/12/2018), Foot drop  (06/03/2022), Marijuana abuse (05/01/2013), MS (multiple sclerosis) (HCC), Seizure (HCC), Seizures (HCC) (01/22/2019), and Status epilepticus (HCC) (07/12/2018).   Significant Hospital Events: Including procedures, antibiotic start and stop dates in addition to other pertinent events   Phenobarbital/Depacon load in the ED, EEG continuous monitoring started    Interval history:   Restrained overnight due to rolling himself up in IV tubing.  He denies complaints this morning and wants to try to eat.  Objective   Blood pressure 117/88, pulse 90, temperature 98.7 F (37.1 C), temperature source Axillary, resp. rate (!) 25, weight 66.8 kg, SpO2 99%.        Intake/Output Summary (Last 24 hours) at 04/26/2023 0810 Last data filed at 04/26/2023 0800 Gross per 24 hour  Intake 1541.05 ml  Output 1085 ml  Net 456.05 ml   Filed Weights   04/25/23 0500 04/26/23 0428  Weight: 68.1 kg 66.8 kg    Examination: Physical exam General: young man lying in bed, awake and alert Lungs: breathing comfortably on RA, CTAB Cardiac: S1S2, RRR Abdomen: soft, NT Skin: warm, dry, no rashes. Sacrum covered Neuro: awake, alert, at first mostly nodding but would speak to answer questions and more motivated to speak once he knew we needed to test swallowing. Moving all extremities. Answering questions appropriately. GU: external catheter   Bicarb 17 AG 11, chloride 110 BUN 9 Cr 1.26 Blood cultures pending CT abd pelvis: no abscess or fluid collection, induration and phlegmon of the [place] fat overlying R buttock   Resolved Hospital Problem list   N/A  Assessment & Plan:  Seizure, status epilepticus-- suspect subtherapeutic valproate as cause Acute encephalopathy due to post-ictal state; improved today -cEEG> likely can be d/c today with substantial improvement in mentation  today -con't phenobarb; remains off PTA topamax and depakote -mentation is likely adequate for him to get brain MRI today; will discuss  with Neuro -swallow study today; hopefully can d/c NGT -SLP consult due to bedside swallow results  Multiple sclerosis, chronic gait disturbance due to foot drop -follows OP with [facility], on Ocrevus IV every 6 months -PT, OT today  Leukocytosis R buttocks wound; no abscess on CT- present on admission -follow blood cultures -periodically trend CBC -con't vanc, zosyn to cover wound on R buttock (treatment duration 6-14 days warranted depending on clinical response, keeping in mind he is immunocompromised due to treatment for his MS). Needs reassessment at least on 4/6 prior to d/c of antibiotics to determine duration. -frequent turns; now that he is awake he is mostly lying on his side  NAGMA; suspect due to high chloride containing fluids -encourage PO intake today; if not passing swallow needs increased FW via NGT  Mother Miguel Black updated via phone.   Best Practice (right click and "Reselect all SmartList Selections" daily)   Diet/type: tubefeeds, ADAT DVT prophylaxis LMWH Pressure ulcer(s): present on admission - R buttock GI prophylaxis: N/A Lines: N/A Foley:  N/A Code Status:  full code Last date of multidisciplinary goals of care discussion [ ]   Labs   CBC: Recent Labs  Lab 04/24/23 2052 04/25/23 0622  WBC 11.4* 21.5*  NEUTROABS 8.2*  --   HGB 14.6 14.5  HCT 45.7 43.7  MCV 100.4* 96.9  PLT 372 304    Basic Metabolic Panel: Recent Labs  Lab 04/24/23 2052 04/25/23 0622 04/25/23 1236 04/25/23 1649 04/26/23 0437  NA 141 140  --   --  138  K 4.3 3.5  --   --  3.5  CL 112* 110  --   --  110  CO2 20* 17*  --   --  17*  GLUCOSE 122* 98  --   --  108*  BUN 8 9  --   --  9  CREATININE 1.06 0.89  --   --  1.26*  CALCIUM 8.0* 8.4*  --   --  8.3*  MG  --   --  2.0 2.2 2.1  PHOS  --   --  3.6 2.9 2.3*   GFR: Estimated Creatinine Clearance: 80.3 mL/min (A) (by C-G formula based on SCr of 1.26 mg/dL (H)). Recent Labs  Lab 04/24/23 2052 04/25/23 0622  04/25/23 1001 04/25/23 1229  WBC 11.4* 21.5*  --   --   LATICACIDVEN  --  1.9 1.7 1.3    Liver Function Tests: Recent Labs  Lab 04/24/23 2052 04/26/23 0437  AST 17 12*  ALT 14 11  ALKPHOS 48 45  BILITOT 0.5 0.6  PROT 6.7 6.6  ALBUMIN 3.8 3.5     Critical care time:      Steffanie Dunn, DO 04/26/23 8:26 AM [facility]  For contact information, see Amion. If no response to pager, please call PCCM consult pager. After hours, 7PM- 7AM, please call Elink.

## 2023-04-26 NOTE — Evaluation (Signed)
 Physical Therapy Evaluation Patient Details Name: Miguel Black MRN: [id number] DOB: January 05, 1991 Today's Date: 04/26/2023  History of Present Illness  Pt is a 33 y/o male admitted 04/25/23 after a seizure at home with family. CT head did not reveal acute intracranial abnormality. CXR was negative for acute pulmonary disease. Dx with acute encephalopathy. PMH includes MS, seizures. Of note: hospitalized 02/13/23-02/17/23.  Clinical Impression  PTA, pt typically modI with ambulation using SPC and was attending OPPT. History of left foot drop at baseline. Pt presents with decreased functional mobility secondary to impaired cognition, balance, activity tolerance. Pt oriented to self only; unable to state age or year he was born. Pt requiring two person minimal assist for step pivot transfers and handheld guidance. Unable to sequence for lower body dressing I.e. donning socks. Based on PLOF and age, suspect steady progress. Patient will benefit from intensive inpatient follow-up therapy, >3 hours/day.      If plan is discharge home, recommend the following: A little help with walking and/or transfers;A little help with bathing/dressing/bathroom;Assistance with cooking/housework;Assist for transportation;Direct supervision/assist for financial management;Direct supervision/assist for medications management;Supervision due to cognitive status;Help with stairs or ramp for entrance   Can travel by private vehicle        Equipment Recommendations None recommended by PT  Recommendations for Other Services  Rehab consult    Functional Status Assessment Patient has had a recent decline in their functional status and demonstrates the ability to make significant improvements in function in a reasonable and predictable amount of time.     Precautions / Restrictions Precautions Precautions: Other (comment);Fall (Seizure) Recall of Precautions/Restrictions: Impaired Restrictions Weight Bearing Restrictions  Per Provider Order: No      Mobility  Bed Mobility Overal bed mobility: Needs Assistance Bed Mobility: Supine to Sit     Supine to sit: Contact guard, HOB elevated, +2 for safety/equipment     General bed mobility comments: line management, no physical assist,    Transfers Overall transfer level: Needs assistance Equipment used: 2 person hand held assist Transfers: Sit to/from Stand, Bed to chair/wheelchair/BSC Sit to Stand: Min assist, +2 safety/equipment   Step pivot transfers: Min assist, +2 safety/equipment       General transfer comment: braces against the bed, and pulls up on therapists arms. Decreased left foot clearance (hx of foot drop)    Ambulation/Gait                  Stairs            Wheelchair Mobility     Tilt Bed    Modified Rankin (Stroke Patients Only)       Balance Overall balance assessment: Needs assistance Sitting-balance support: Single extremity supported, Feet supported Sitting balance-Leahy Scale: Fair Sitting balance - Comments: LOB x2 when trying to don socks requiring therapist assist to correct   Standing balance support: Bilateral upper extremity supported, During functional activity Standing balance-Leahy Scale: Poor                               Pertinent Vitals/Pain Pain Assessment Pain Assessment: Faces Faces Pain Scale: No hurt    Home Living Family/patient expects to be discharged to:: Private residence Living Arrangements: Parent;Other relatives Available Help at Discharge: Available 24 hours/day;Family   Home Access: Stairs to enter Entrance Stairs-Rails: Right;Left;Can reach both Entrance Stairs-Number of Steps: 7 Alternate Level Stairs-Number of Steps: flight Home Layout: Two level;Bed/bath upstairs Home Equipment:  Shower seat;Cane - Programmer, applications (2 wheels);Grab bars - tub/shower;Wheelchair - manual;Cane - single point;BSC/3in1 Additional Comments: Mother works from home. -  Home set up taken from previous admission    Prior Function Prior Level of Function : Needs assist;Patient poor historian/Family not available             Mobility Comments: Pt going to OPPT, walks with SPC? ADLs Comments: had been independent prior to Jan admission, unsure of independence since then     [facility] Assessment   Upper Extremity Assessment Upper Extremity Assessment: Overall WFL for tasks assessed    Lower Extremity Assessment Lower Extremity Assessment: LLE deficits/detail LLE Deficits / Details: Hx foot drop    Cervical / Trunk Assessment Cervical / Trunk Assessment: Normal  Communication   Communication Communication: No apparent difficulties;Other (comment) (unsure of baseline communication) Factors Affecting Communication: Difficulty expressing self    Cognition Arousal: Alert Behavior During Therapy: WFL for tasks assessed/performed   PT - Cognitive impairments: Orientation, Awareness, Memory, Attention, Sequencing, Problem solving, Safety/Judgement   Orientation impairments: Place, Time, Situation                   PT - Cognition Comments: Pt able to state name, day/month of birthday, but not year he was born or age. Pt perseverative, repeating, "I'm not allowed to tell you," without context. Difficulty following basic commands or performing ADL's Following commands: Impaired       Cueing Cueing Techniques: Verbal cues, Gestural cues, Tactile cues     General Comments General comments (skin integrity, edema, etc.): VSS throughout session    Exercises     Assessment/Plan    PT Assessment Patient needs continued PT services  PT Problem List Decreased balance;Decreased mobility;Decreased cognition;Decreased safety awareness       PT Treatment Interventions DME instruction;Gait training;Stair training;Functional mobility training;Therapeutic activities;Therapeutic exercise;Balance training;Patient/family education    PT Goals  (Current goals can be found in the Care Plan section)  Acute Rehab PT Goals Patient Stated Goal: did not state PT Goal Formulation: Patient unable to participate in goal setting Time For Goal Achievement: 05/10/23 Potential to Achieve Goals: Good    Frequency Min 2X/week     Co-evaluation               AM-PAC PT "6 Clicks" Mobility  Outcome Measure Help needed turning from your back to your side while in a flat bed without using bedrails?: A Little Help needed moving from lying on your back to sitting on the side of a flat bed without using bedrails?: A Little Help needed moving to and from a bed to a chair (including a wheelchair)?: A Little Help needed standing up from a chair using your arms (e.g., wheelchair or bedside chair)?: A Little Help needed to walk in hospital room?: Total Help needed climbing 3-5 steps with a railing? : Total 6 Click Score: 14    End of Session Equipment Utilized During Treatment: Gait belt Activity Tolerance: Patient tolerated treatment well Patient left: in chair;with call bell/phone within reach;with chair alarm set Nurse Communication: Mobility status PT Visit Diagnosis: Unsteadiness on feet (R26.81)    Time: 1610-9604 PT Time Calculation (min) (ACUTE ONLY): 23 min   Charges:   PT Evaluation $PT Eval Moderate Complexity: 1 Mod   PT General Charges $$ ACUTE PT VISIT: 1 Visit         Lillia Pauls, PT, DPT Acute Rehabilitation Services Office [phone]   Norval Morton 04/26/2023, 3:34 PM

## 2023-04-26 NOTE — Consult Note (Signed)
 WOC Nurse Consult Note: Reason for Consult: buttock wound Wound type: Stage 4 (history) sacral wound; appears to be improved; full thickness Stage 3 Pressure Injury; buttock  Pressure Injury POA: Yes Measurement: see nursing flow sheets Wound bed: sacrum; 100% pale pink; buttock 100% pink Drainage (amount, consistency, odor) none Periwound: intact  Dressing procedure/placement/frequency: Cleanse buttock and sacral wound with saline, pat dry Apply small amount of hydrogel to wound bed [facility] [place] # [phone]), top with dry dressing.  Change daily  LALM in place, orders placed for air mattress should the patient leave the ICU  Consult RD for wound supplementation   Re consult if needed, will not follow at this time. Thanks  [employer]anmi [employer]ain [employer], RN,CWOCN, CNS, CWON-AP [phone])

## 2023-04-26 NOTE — TOC Initial Note (Signed)
 Transition of Care [facility]) - Initial/Assessment Note    Patient Details  Name: Miguel Black MRN: [id number] Date of Birth: March 13, 1990  Transition of Care [facility]) CM/SW Contact:    Lamonte Sakai, Student-Social Work Phone Number: 04/26/2023, 10:04 AM  Clinical Narrative:                  Pt admitted from home with history of MS and seizures. No current TOC needs, please consult as needs arise.        Patient Goals and CMS Choice            Expected Discharge Plan and Services       Living arrangements for the past 2 months: Apartment                                      Prior Living Arrangements/Services Living arrangements for the past 2 months: Apartment                     Activities of Daily Living      Permission Sought/Granted                  Emotional Assessment       Orientation: : Oriented to Self, Oriented to Place Alcohol / Substance Use: Tobacco Use    Admission diagnosis:  Status epilepticus (HCC) [G40.901] Seizure (HCC) [R56.9] Patient Active Problem List   Diagnosis Date Noted   Seizure (HCC) 04/25/2023   Seizures (HCC) 11/12/2022   Breakthrough seizure (HCC) 10/07/2022   Leukocytosis 10/07/2022   Hypocalcemia 10/07/2022   Elevated AST (SGOT) 10/07/2022   Status epilepticus (HCC) 06/13/2022   Ambulates with cane 06/04/2022   Ataxia 06/04/2022   Foot drop 06/03/2022   Vitamin D deficiency 06/03/2022   Smokes cigars 06/03/2022   Neurological deficit present 04/26/2022   History of optic neuritis 07/29/2020   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  [address] [place] [address] Phone: [phone] Fax: [phone]  [facility] [place], [place] - [address] [place] [address] Phone: [phone] Fax: [phone]  [facility] [address] [place] [address] Phone: [phone] Fax: [phone]     Social Drivers of Health (SDOH) Social History: SDOH Screenings   Food Insecurity: Patient Unable To Answer (04/25/2023)  Housing: Patient Unable To Answer (04/25/2023)  Transportation Needs: Patient Unable To Answer (04/25/2023)  Utilities: Patient Unable To Answer (04/25/2023)  Depression (PHQ2-9): Low Risk  (06/03/2022)  Social Connections: Unknown (10/13/2021)   Received from [facility]  Tobacco Use: High Risk (04/24/2023)   SDOH Interventions:     Readmission Risk Interventions    06/15/2022    2:41 PM  Readmission Risk Prevention Plan  Transportation Screening Complete  PCP or Specialist Appt within 5-7 Days Complete  Home Care Screening Complete  Medication Review (RN CM) Referral to Pharmacy

## 2023-04-26 NOTE — Progress Notes (Signed)
 NEUROLOGY CONSULT FOLLOW UP NOTE   Date of service: April 26, 2023 Patient Name: DAVON ABDELAZIZ MRN:  [id number] DOB:  12/30/1990  Yamen KHALED HERDA is a 33 y.o. left-handed man with a PMHx significant for MS on Ocrevus (last dose due 11/23/2022 per notes but unclear if he received this), complicated by epilepsy (on Depakote and either lamotrigine or topiramate)   He has had frequent presentations with seizures in the past secondary to medication nonadherence frequently, but sometimes also in the setting of medication adherence.    Last admission 02/13/2023 he had medication non-adherence, was discharged on  Depakote 250 mg BID (02/15/2023 Was on Depakote 250 every 6 hours (1 gram daily) -- level 68 but ammonia 82 so reduced to 250 mg every 12 hours (500 mg daily with improved ammonia to 24)  Topirimate 100 mg BID  He was scheduled for follow-up on 04/03/2023 with neurology but did not follow up  For example on admission 10/07/2022 his valproic acid level was therapeutic so lamotrigine was added.  However on PCP follow-up 11/24/2022 topiramate was started, he was encouraged to use a high CBD marijuana, and there was a plan to refer to new neurologist per notes; he has previously followed with Dr. Epimenio Foot at [facility], last seen by NP Lomax on 08/08/2022 with prescription for Ocrevus ordered on 10/26/2022   Per dispense report available 02/13/2023 Topiramate was last dispensed 02/11/2023 for a 90-day supply Lamotrigine was last dispensed 11/14/2022 for a 30-day supply Zonisamide was last dispensed 11/18/2022 for a 90-day supply Depakote was last dispensed 10/09/2022 for a 90-day supply   Other prior seizure medications trialed: Keppra and Briviact both associated with mood changes Vimpat, unclear why discontinued (06/15/2022 note switched back to home zonisamide)  Semiology: On 11/22/2022 notes: "Spoke with patient's mother who reports that he was sitting and chatting with his  family when he abruptly stopped talking, staring off and had a witnessed right arm shaking with his eyes open. The shaking went on for a minute or 2 and resolved. Family was able to move him to the bedroom where he had 5 additional episodes of right arm jerking with eyes open lasting about a minute and he kept going in and out of the spells."  Hospital course  4/3 improving mental status, right buttock abscess noted   Interval Hx/subjective   - Improving though still fairly aphasic, no complaints but wiggly in bed  Vitals   Vitals:   04/26/23 0428 04/26/23 0500 04/26/23 0600 04/26/23 0700  BP:  (!) 135/97 127/78 117/88  Pulse:  81 74 90  Resp:  17 11 (!) 25  Temp:      TempSrc:      SpO2:  99% 100% 99%  Weight: 66.8 kg       Current vital signs: BP 117/88   Pulse 90   Temp 98.7 F (37.1 C) (Axillary)   Resp (!) 25   Wt 66.8 kg   SpO2 99%   BMI 20.54 kg/m  Vital signs in last 24 hours: Temp:  [98.1 F (36.7 C)-98.7 F (37.1 C)] 98.7 F (37.1 C) (04/02 0400) Pulse Rate:  [63-103] 90 (04/02 0700) Resp:  [11-28] 25 (04/02 0700) BP: (99-147)/(62-111) 117/88 (04/02 0700) SpO2:  [96 %-100 %] 99 % (04/02 0700) Weight:  [66.8 kg] 66.8 kg (04/02 0428)  Body mass index is 20.54 kg/m.  Physical Exam   Constitutional: Appears well-developed and well-nourished.  Psych: Affect smiling, redirectable, initially almost seemed avoidant Eyes:  No scleral injection.  HENT: No OP obstrucion.  Head: Normocephalic. EEG in place  Cardiovascular: Normal rate and regular rhythm.  On monitor Respiratory: Effort normal, non-labored breathing.  GI: Soft.  No distension. There is no tenderness.    Neurologic Examination   Follows simple commands. Able to repeat better than naming. Mild dysarthria. Cannot name seizure medications, but able to state at [facility], birthday (not age)  Bilateral INO.  Pupils equal round and reactive to light.  Mild right facial droop.   Mild right  hemiparesis improving from yesterday   Medications  Current Facility-Administered Medications:    acetaminophen (TYLENOL) suppository 650 mg, 650 mg, Rectal, Q6H PRN, Luciano Cutter, MD, 650 mg at 04/25/23 0155   Chlorhexidine Gluconate Cloth 2 % PADS 6 each, 6 each, Topical, Daily, Steffanie Dunn, DO, 6 each at 04/26/23 0515   docusate sodium (COLACE) capsule 100 mg, 100 mg, Oral, BID PRN, Arne Cleveland T, PA-C   enoxaparin (LOVENOX) injection 40 mg, 40 mg, Subcutaneous, Q24H, Latta, Marc T, PA-C, 40 mg at 04/25/23 3664   feeding supplement (OSMOLITE 1.5 CAL) liquid 1,000 mL, 1,000 mL, Per Tube, Continuous, Steffanie Dunn, DO, Last Rate: 30 mL/hr at 04/26/23 0800, Infusion Verify at 04/26/23 0800   feeding supplement (PROSource TF20) liquid 60 mL, 60 mL, Per Tube, Daily, Karie Fetch P, DO   insulin aspart (novoLOG) injection 0-15 Units, 0-15 Units, Subcutaneous, Q4H, Luciano Cutter, MD   LORazepam (ATIVAN) injection 2 mg, 2 mg, Intravenous, Q1H PRN, Luciano Cutter, MD   nutrition supplement (JUVEN) (JUVEN) powder packet 1 packet, 1 packet, Per Tube, BID BM, Steffanie Dunn, DO   Oral care mouth rinse, 15 mL, Mouth Rinse, 4 times per day, Kalman Shan, MD, 15 mL at 04/26/23 0744   Oral care mouth rinse, 15 mL, Mouth Rinse, PRN, Kalman Shan, MD, 15 mL at 04/26/23 0131   PHENObarbital (LUMINAL) injection 65 mg, 65 mg, Intravenous, BID, Luciano Cutter, MD, 65 mg at 04/25/23 2205   [COMPLETED] piperacillin-tazobactam (ZOSYN) IVPB 3.375 g, 3.375 g, Intravenous, Once, Stopping previously hung infusion at 04/25/23 2030 **FOLLOWED BY** piperacillin-tazobactam (ZOSYN) IVPB 3.375 g, 3.375 g, Intravenous, Q8H, Pham, Minh Q, RPH-CPP, Last Rate: 12.5 mL/hr at 04/26/23 0800, Infusion Verify at 04/26/23 0800   polyethylene glycol (MIRALAX / GLYCOLAX) packet 17 g, 17 g, Oral, Daily PRN, Arne Cleveland T, PA-C   vancomycin (VANCOREADY) IVPB 750 mg/150 mL, 750 mg, Intravenous, Q8H, Daylene Posey, RPH, Stopped at 04/26/23 0612  Labs and Diagnostic Imaging    Basic Metabolic Panel: Recent Labs  Lab 04/24/23 2052 04/25/23 0622 04/25/23 1236 04/25/23 1649 04/26/23 0437  NA 141 140  --   --  138  K 4.3 3.5  --   --  3.5  CL 112* 110  --   --  110  CO2 20* 17*  --   --  17*  GLUCOSE 122* 98  --   --  108*  BUN 8 9  --   --  9  CREATININE 1.06 0.89  --   --  1.26*  CALCIUM 8.0* 8.4*  --   --  8.3*  MG  --   --  2.0 2.2 2.1  PHOS  --   --  3.6 2.9 2.3*    CBC: Recent Labs  Lab 04/24/23 2052 04/25/23 0622  WBC 11.4* 21.5*  NEUTROABS 8.2*  --   HGB 14.6 14.5  HCT 45.7 43.7  MCV 100.4*  96.9  PLT 372 304    Coagulation Studies: No results for input(s): "LABPROT", "INR" in the last 72 hours.    Lipid Panel:  Lab Results  Component Value Date   LDLCALC 113 (H) 06/03/2022   HgbA1c:  Lab Results  Component Value Date   HGBA1C 4.7 (L) 04/25/2023   Urine Drug Screen:     Component Value Date/Time   LABOPIA NONE DETECTED 02/13/2023 1621   COCAINSCRNUR NONE DETECTED 02/13/2023 1621   LABBENZ POSITIVE (A) 02/13/2023 1621   AMPHETMU NONE DETECTED 02/13/2023 1621   THCU POSITIVE (A) 02/13/2023 1621   LABBARB NONE DETECTED 02/13/2023 1621    Alcohol Level     Component Value Date/Time   ETH <10 02/13/2023 1521   AED levels: Valproate 16 Component Ref Range & Units (hover) 1 d ago (04/24/23) 2 mo ago (02/15/23) 2 mo ago (02/13/23) 5 mo ago (11/12/22) 6 mo ago (10/08/22)  Valproic Acid Lvl 16 Low  68 CM <10 Low  CM 57 CM 80 CM      Latest Reference Range & Units 12/04/19 07:20 11/13/22 00:35 02/13/23 16:21 02/15/23 05:18 02/16/23 05:42  Ammonia 9 - 35 umol/L 22 46 (H) 42 (H) 82 (H) 24  (H): Data is abnormally high  CT Head without contrast(Personally reviewed): Other than a question of artifact in the left occipital area (common area for artifact, less likely subacute stroke) - no acute abnormality.  rEEG:  ABNORMALITY - Continuous slow,  generalized and maximal left fronto-temporal  IMPRESSION: This study is suggestive of cortical dysfunction arising from lleft fronto-temporal region likely secondary to underlying structural abnormality, post-ictal state. Additionally there is moderate diffuse encephalopathy. No seizures or epileptiform discharges were seen throughout the recording.  Assessment   Alberto SAVAN RUTA is a 33 y.o. male presenting with breakthrough seizures likely in the setting of medication nonadherence again  Given his rapid, medical improvement and I do not suspect meningitis is playing a role here, appreciate CCM treatment of buttocks wound; discomfort from this plus stress from father's hospitalization may have lead to reduced seizure thershold  Mom reports no missed doses, notes always has suicidal ideation when current seizure medications switched to other medications -- suspect significant mood stabilization effects of Depakote here. So will try increasing Depakote again, with L-carnitine and stop phenobarb  Recommendations  Stop phenobarb Valproate level 16- give 500 mg depakote once IV, then start 250 mg TID (increased from home dose of 250 mg BID) Check ammonia for baseline and monitor; add L-carnitine 500 mg BID to minimize hyperammonemia  Topirimate level could not be added on, collected 12:29 AM on 4/1 (presented 04/24/2023); none given in hospital prior to level as no PO access. Follow-up level, outpatient neurologist can consider dose adjustment If depakote needs to be reduced to 250 mg BID again due to hyperammonemia (would continue at least 250 mg BID for mood stabilization), consider re-addition of vimpat or increase of topiramate  D/C LTM EEG  Seizure precautions MR brain with and without contrast after clinical status improves further (not yet ordered due to aphasia will limit ability to stay still for exam) Appreciate management of comorbidities per CCM, discussed with CCM team in  person ______________________________________________________________________  Brooke Dare MD-PhD [facility] [phone] Available 7 AM to 7 PM, outside these hours please contact Neurologist on call listed on AMION

## 2023-04-26 NOTE — Evaluation (Signed)
 Clinical/Bedside Swallow Evaluation Patient Details  Name: Miguel Black MRN: [id number] Date of Birth: 21-Nov-1990  Today's Date: 04/26/2023 Time: SLP Start Time (ACUTE ONLY): 0945 SLP Stop Time (ACUTE ONLY): 1005 SLP Time Calculation (min) (ACUTE ONLY): 20 min  Past Medical History:  Past Medical History:  Diagnosis Date   Acute encephalopathy 04/26/2022   Acute metabolic encephalopathy 04/26/2022   Acute respiratory failure with hypoxia (HCC) 08/23/2021   Aspiration pneumonia (HCC) 07/12/2018   Closed fracture of distal phalanx or phalanges of hand 02/05/2009   Formatting of this note might be different from the original. Overview: The original diagnosis was replaced automatically by the ICD-10 team to an ICD-10 compliant one. Please review for accuracy   Encounter for screening for HIV 07/12/2018   Foot drop 06/03/2022   Has a left foot drop ever since 2015 first flare of multiple sclerosis had a brace in 2022 custom but does not actually do anything to lift the from the foot physical therapist advised him to get an alternative foot drop brace but I cannot find record of that   Marijuana abuse 05/01/2013   MS (multiple sclerosis) (HCC)    Seizure (HCC)    Seizures (HCC) 01/22/2019   Status epilepticus (HCC) 07/12/2018   Past Surgical History:  Past Surgical History:  Procedure Laterality Date   NO PAST SURGERIES     HPI:  Patient is a 33 y.o. male with PMH: MS, seizures. On 04/25/23, patient had a seizure at home with family administering intranasal diazepam. This was followed by another seizure and EMS was then called. Patient continued to have seizures when EMS arrived, gave him 10mg  IM midazolam and seizure activity did not break until approximately 15 minutes later. He was brought to ED via EMS. In ED he was difficult to arouse and airway protection provided via nasal trumpet. CT head did not reveal acute intracranial abnormality. CXR was negative for acute pulmonary disease.  NG was placed and he was started on tube feedings. SLP swallow evaluation ordered with RN reporting that patient was inconsistently holding liquids in mouth and there was concern for his swallowing safety.    Assessment / Plan / Recommendation  Clinical Impression  Patient presents with clinical s/s of dysphagia as per this bedside swallow evaluation with SLP suspecting this is a cognitive-based dysphagia. He has presented in past hospital admissions with cognitive-based dysphagia following seizures which resolves. Patient was awake, alert, initially non-verbal but able to verbalize with cues. He is oriented to self only. He perserated on telling SLP, "I can't piss. Im not allowed to" and per RN he was expessing this same thing earlier. (he has a condom cath) SLP assessed his swallow function via PO's of thin liquids, nectar thick liquids, puree solids and mechanical soft solids. Patient with delay in oral preparatory phase with SLP holding spoon, cup, etc at lips or in anterior portion of oral cavity before he would exhibited bilabial closure. Mastication was timely once initiated and no oral residuals with any PO's. With liquids patient exhibited brief oral holding and swallow initiation delays but no overt s/s aspiration. As patient remains confused and presenting with swallow initiation delays, SLP recommending initiate PO diet of Dys 3 (mechanical soft) solids and nectar thick liquids. Suspect that he will be able to advance to thin liquids and possibly regular texture solids when his cognition improves. SLP Visit Diagnosis: Dysphagia, unspecified (R13.10)    Aspiration Risk  Mild aspiration risk    Diet Recommendation Dysphagia 3 (  Mech soft);Nectar-thick liquid    Liquid Administration via: Cup;Straw Medication Administration: Whole meds with puree Supervision: Full supervision/cueing for compensatory strategies;Staff to assist with self feeding Compensations: Slow rate;Small sips/bites;Minimize  environmental distractions Postural Changes: Seated upright at 90 degrees    Other  Recommendations Oral Care Recommendations: Oral care BID;Staff/trained caregiver to provide oral care    Recommendations for follow up therapy are one component of a multi-disciplinary discharge planning process, led by the attending physician.  Recommendations may be updated based on patient status, additional functional criteria and insurance authorization.  Follow up Recommendations Other (comment) (TBD)      Assistance Recommended at Discharge    Functional Status Assessment Patient has had a recent decline in their functional status and demonstrates the ability to make significant improvements in function in a reasonable and predictable amount of time.  Frequency and Duration min 2x/week  1 week       Prognosis Prognosis for improved oropharyngeal function: Good Barriers to Reach Goals: Cognitive deficits      Swallow Study   General Date of Onset: 04/26/23 HPI: Patient is a 33 y.o. male with PMH: MS, seizures. On 04/25/23, patient had a seizure at home with family administering intranasal diazepam. This was followed by another seizure and EMS was then called. Patient continued to have seizures when EMS arrived, gave him 10mg  IM midazolam and seizure activity did not break until approximately 15 minutes later. He was brought to ED via EMS. In ED he was difficult to arouse and airway protection provided via nasal trumpet. CT head did not reveal acute intracranial abnormality. CXR was negative for acute pulmonary disease. NG was placed and he was started on tube feedings. SLP swallow evaluation ordered with RN reporting that patient was inconsistently holding liquids in mouth and there was concern for his swallowing safety. Type of Study: Bedside Swallow Evaluation Previous Swallow Assessment: during previous admissions, BSE Diet Prior to this Study: NPO Temperature Spikes Noted: No Respiratory Status:  Nasal cannula History of Recent Intubation: No Behavior/Cognition: Alert;Cooperative;Pleasant mood;Confused Oral Cavity Assessment: Within Functional Limits Oral Care Completed by SLP: Yes Oral Cavity - Dentition: Adequate natural dentition Self-Feeding Abilities: Total assist Patient Positioning: Upright in bed Baseline Vocal Quality: Normal Volitional Cough: Cognitively unable to elicit Volitional Swallow: Able to elicit    Oral/Motor/Sensory Function Overall Oral Motor/Sensory Function: Within functional limits   Ice Chips     Thin Liquid Thin Liquid: Impaired Presentation: Cup;Straw Oral Phase Impairments: Poor awareness of bolus Oral Phase Functional Implications: Prolonged oral transit;Oral holding Pharyngeal  Phase Impairments: Suspected delayed Swallow    Nectar Thick Nectar Thick Liquid: Impaired Presentation: Straw Oral phase functional implications: Prolonged oral transit Pharyngeal Phase Impairments: Suspected delayed Swallow   Honey Thick     Puree Puree: Impaired Presentation: Spoon Oral Phase Impairments: Poor awareness of bolus;Reduced labial seal   Solid     Solid: Impaired Oral Phase Impairments: Other (comment) (delayed initiation of oral preparatory phase)     Angela Nevin, MA, CCC-SLP Speech Therapy

## 2023-04-27 DIAGNOSIS — R1311 Dysphagia, oral phase: Secondary | ICD-10-CM | POA: Diagnosis not present

## 2023-04-27 DIAGNOSIS — L03317 Cellulitis of buttock: Secondary | ICD-10-CM | POA: Diagnosis not present

## 2023-04-27 DIAGNOSIS — G9340 Encephalopathy, unspecified: Secondary | ICD-10-CM | POA: Diagnosis not present

## 2023-04-27 DIAGNOSIS — R569 Unspecified convulsions: Secondary | ICD-10-CM | POA: Diagnosis not present

## 2023-04-27 LAB — COMPREHENSIVE METABOLIC PANEL WITH GFR
ALT: 12 U/L (ref 0–44)
AST: 14 U/L — ABNORMAL LOW (ref 15–41)
Albumin: 3.5 g/dL (ref 3.5–5.0)
Alkaline Phosphatase: 41 U/L (ref 38–126)
Anion gap: 11 (ref 5–15)
BUN: 8 mg/dL (ref 6–20)
CO2: 21 mmol/L — ABNORMAL LOW (ref 22–32)
Calcium: 8.5 mg/dL — ABNORMAL LOW (ref 8.9–10.3)
Chloride: 108 mmol/L (ref 98–111)
Creatinine, Ser: 0.93 mg/dL (ref 0.61–1.24)
GFR, Estimated: >60 mL/min (ref >60)
Glucose, Bld: 103 mg/dL — ABNORMAL HIGH (ref 70–99)
Potassium: 3.8 mmol/L (ref 3.5–5.1)
Sodium: 140 mmol/L (ref 135–145)
Total Bilirubin: 0.7 mg/dL (ref 0.0–1.2)
Total Protein: 6.5 g/dL (ref 6.5–8.1)

## 2023-04-27 LAB — GLUCOSE, CAPILLARY
Glucose-Capillary: 100 mg/dL — ABNORMAL HIGH (ref 70–99)
Glucose-Capillary: 102 mg/dL — ABNORMAL HIGH (ref 70–99)
Glucose-Capillary: 105 mg/dL — ABNORMAL HIGH (ref 70–99)
Glucose-Capillary: 107 mg/dL — ABNORMAL HIGH (ref 70–99)
Glucose-Capillary: 121 mg/dL — ABNORMAL HIGH (ref 70–99)
Glucose-Capillary: 121 mg/dL — ABNORMAL HIGH (ref 70–99)

## 2023-04-27 LAB — CBC
HCT: 43.4 % (ref 39.0–52.0)
Hemoglobin: 14.7 g/dL (ref 13.0–17.0)
MCH: 32.1 pg (ref 26.0–34.0)
MCHC: 33.9 g/dL (ref 30.0–36.0)
MCV: 94.8 fL (ref 80.0–100.0)
Platelets: 340 10*3/uL (ref 150–400)
RBC: 4.58 MIL/uL (ref 4.22–5.81)
RDW: 14.6 % (ref 11.5–15.5)
WBC: 8.9 10*3/uL (ref 4.0–10.5)
nRBC: 0 % (ref 0.0–0.2)

## 2023-04-27 LAB — AMMONIA: Ammonia: 31 umol/L (ref 9–35)

## 2023-04-27 MED ORDER — DIVALPROEX SODIUM 250 MG PO DR TAB
250.0000 mg | DELAYED_RELEASE_TABLET | Freq: Every morning | ORAL | Status: DC
  Administered 2023-04-28 – 2023-05-01 (×4): 250 mg via ORAL
  Filled 2023-04-27 (×4): qty 1

## 2023-04-27 MED ORDER — SODIUM CHLORIDE 0.9 % IV SOLN
3.0000 g | Freq: Four times a day (QID) | INTRAVENOUS | Status: DC
  Administered 2023-04-27 – 2023-04-28 (×5): 3 g via INTRAVENOUS
  Filled 2023-04-27 (×5): qty 8

## 2023-04-27 MED ORDER — LINEZOLID 600 MG PO TABS
600.0000 mg | ORAL_TABLET | Freq: Two times a day (BID) | ORAL | Status: DC
  Administered 2023-04-27 – 2023-05-01 (×8): 600 mg via ORAL
  Filled 2023-04-27 (×10): qty 1

## 2023-04-27 MED ORDER — CLOBAZAM 10 MG PO TABS
10.0000 mg | ORAL_TABLET | Freq: Every day | ORAL | Status: DC
  Administered 2023-04-27 – 2023-04-30 (×4): 10 mg via ORAL
  Filled 2023-04-27 (×4): qty 1

## 2023-04-27 MED ORDER — DIVALPROEX SODIUM 250 MG PO DR TAB
500.0000 mg | DELAYED_RELEASE_TABLET | Freq: Every day | ORAL | Status: DC
  Administered 2023-04-27 – 2023-04-30 (×4): 500 mg via ORAL
  Filled 2023-04-27: qty 2
  Filled 2023-04-27: qty 1
  Filled 2023-04-27: qty 2
  Filled 2023-04-27: qty 1
  Filled 2023-04-27: qty 2

## 2023-04-27 NOTE — Progress Notes (Signed)
 Speech Language Pathology Treatment: Dysphagia  Patient Details Name: Miguel Black MRN: [id number] DOB: Jun 29, 1990 Today's Date: 04/27/2023 Time: 4098-1191 SLP Time Calculation (min) (ACUTE ONLY): 23 min  Assessment / Plan / Recommendation Clinical Impression  Patient seen for f/u diet tolerance assessment following initial evaluation 4/2. Patient alert, feeding self pm meal upon arrival. No complaints of difficulty swallowing noted however subtle wet vocal quality observed initially. Patient able to self feed regular solids and thin liquids without overt indication of aspiration. This wet vocal quality was noted intermittently during treatment session however consistently cleared with spontaneous dry swallow, overall patient appears to be protecting his airway. Mildly prolonged mastication and a brief hesitancy in swallow initiation noted with palpable posterior movement of base of tongue and 1-2 second pause prior to hyo-laryngeal elevation/excursion. Patient in agreement for diet advancement to regular solids, thin liquids as per baseline. No f/u SLP needs indicated at this time.    HPI HPI: Patient is a 33 y.o. male with PMH: MS, seizures. On 04/25/23, patient had a seizure at home with family administering intranasal diazepam. This was followed by another seizure and EMS was then called. Patient continued to have seizures when EMS arrived, gave him 10mg  IM midazolam and seizure activity did not break until approximately 15 minutes later. He was brought to ED via EMS. In ED he was difficult to arouse and airway protection provided via nasal trumpet. CT head did not reveal acute intracranial abnormality. CXR was negative for acute pulmonary disease. NG was placed and he was started on tube feedings. SLP swallow evaluation ordered with RN reporting that patient was inconsistently holding liquids in mouth and there was concern for his swallowing safety.      SLP Plan  All goals met      Recommendations for follow up therapy are one component of a multi-disciplinary discharge planning process, led by the attending physician.  Recommendations may be updated based on patient status, additional functional criteria and insurance authorization.    Recommendations  Diet recommendations: Regular;Thin liquid Liquids provided via: Cup;Straw Medication Administration: Whole meds with liquid Supervision: Patient able to self feed Compensations: Slow rate;Small sips/bites Postural Changes and/or Swallow Maneuvers: Seated upright 90 degrees                 Oral care BID     Dysphagia, unspecified (R13.10)     All goals met    Miguel Lango MA, CCC-SLP  Miguel Black  04/27/2023, 12:07 PM

## 2023-04-27 NOTE — Progress Notes (Signed)
 Physical Therapy Treatment Patient Details Name: Miguel Black MRN: [id number] DOB: [birth year] Today's Date: 04/27/2023   History of Present Illness Pt is a 33 y/o male admitted 04/25/23 after a seizure at home with family. CT head did not reveal acute intracranial abnormality. CXR was negative for acute pulmonary disease. Dx with acute encephalopathy. PMH includes MS, seizures. Of note: hospitalized 02/13/23-02/17/23.    PT Comments  Pt progressing towards his physical therapy goals. Slightly improved cognition, now oriented to self and place, but not time or situation. Difficulty following one to two step commands and requires assist with peri care due to decreased initiation and sequencing. Pt requiring minimal assist for transfers and moderate assist for ambulation using handheld assist for guidance. Utilized hall railing to simulate cane use and worked on forward and lateral stepping. Due to change from functional baseline of modI, recommend intensive inpatient follow-up therapy, >3 hours/day.     If plan is discharge home, recommend the following: A little help with walking and/or transfers;A little help with bathing/dressing/bathroom;Assistance with cooking/housework;Assist for transportation;Direct supervision/assist for financial management;Direct supervision/assist for medications management;Supervision due to cognitive status;Help with stairs or ramp for entrance   Can travel by private vehicle        Equipment Recommendations  None recommended by PT    Recommendations for Other Services Rehab consult     Precautions / Restrictions Precautions Precautions: Other (comment);Fall (Seizure) Recall of Precautions/Restrictions: Impaired Precaution/Restrictions Comments: L foot drop (baseline) Restrictions Weight Bearing Restrictions Per Provider Order: No     Mobility  Bed Mobility Overal bed mobility: Needs Assistance Bed Mobility: Supine to Sit     Supine to sit: Contact  guard, HOB elevated     General bed mobility comments: no physical assist    Transfers Overall transfer level: Needs assistance Equipment used: 1 person hand held assist Transfers: Sit to/from Stand, Bed to chair/wheelchair/BSC Sit to Stand: Min assist           General transfer comment: Braces against bed, minA to initially steady    Ambulation/Gait Ambulation/Gait assistance: Mod assist Gait Distance (Feet): 45 Feet Assistive device: 1 person hand held assist (hall railing) Gait Pattern/deviations: Step-through pattern, Decreased stance time - left, Decreased dorsiflexion - left       General Gait Details: heavily reliant through HHA, guidance for obstacle and environmental navigation, modA for balance.   Stairs             Wheelchair Mobility     Tilt Bed    Modified Rankin (Stroke Patients Only)       Balance Overall balance assessment: Needs assistance Sitting-balance support: Single extremity supported, Feet supported Sitting balance-Leahy Scale: Fair     Standing balance support: During functional activity, Single extremity supported Standing balance-Leahy Scale: Poor Standing balance comment: External support by therapist                            Communication Communication Communication: No apparent difficulties;Other (comment) (unsure of baseline communication) Factors Affecting Communication: Difficulty expressing self  Cognition Arousal: Alert Behavior During Therapy: WFL for tasks assessed/performed   PT - Cognitive impairments: Orientation, Awareness, Memory, Attention, Sequencing, Problem solving, Safety/Judgement   Orientation impairments: Place, Time, Situation                   PT - Cognition Comments: Pt with slightly improving orientation, able to state name of hospital, but not city we are in. Not  oriented to month/year; PT referred to calendar in room Following commands: Impaired      Cueing Cueing  Techniques: Verbal cues, Gestural cues, Tactile cues  Exercises Other Exercises Other Exercises: Lateral stepping to R/L with bilateral hand support on hall railing    General Comments        Pertinent Vitals/Pain Pain Assessment Pain Assessment: No/denies pain    Home Living                          Prior Function            PT Goals (current goals can now be found in the care plan section) Acute Rehab PT Goals Patient Stated Goal: did not state PT Goal Formulation: Patient unable to participate in goal setting Time For Goal Achievement: 05/10/23 Potential to Achieve Goals: Good Progress towards PT goals: Progressing toward goals    Frequency    Min 2X/week      PT Plan      Co-evaluation              AM-PAC PT "6 Clicks" Mobility   Outcome Measure  Help needed turning from your back to your side while in a flat bed without using bedrails?: A Little Help needed moving from lying on your back to sitting on the side of a flat bed without using bedrails?: A Little Help needed moving to and from a bed to a chair (including a wheelchair)?: A Little Help needed standing up from a chair using your arms (e.g., wheelchair or bedside chair)?: A Little Help needed to walk in hospital room?: A Lot Help needed climbing 3-5 steps with a railing? : Total 6 Click Score: 15    End of Session Equipment Utilized During Treatment: Gait belt Activity Tolerance: Patient tolerated treatment well Patient left: in chair;with call bell/phone within reach;with chair alarm set Nurse Communication: Mobility status PT Visit Diagnosis: Unsteadiness on feet (R26.81)     Time: 8295-6213 PT Time Calculation (min) (ACUTE ONLY): 37 min  Charges:    $Therapeutic Activity: 23-37 mins PT General Charges $$ ACUTE PT VISIT: 1 Visit                     Lillia Pauls, PT, DPT Acute Rehabilitation Services Office [phone]    Norval Morton 04/27/2023, 10:47  AM

## 2023-04-27 NOTE — Progress Notes (Signed)
 NAME:  Miguel Black, MRN:  [id number], DOB:  02-17-1990, LOS: 2 ADMISSION DATE:  04/24/2023, CONSULTATION DATE:  04/24/2023 REFERRING MD:  Glyn Ade, CHIEF COMPLAINT:  Seizure  History of Present Illness:  Miguel Black is a 33 year old gentleman with known history for multiple sclerosis and seizures disorder.  Patient participated in physical therapy this a.m. secondary to known foot drop for increase available extremity strength and balance of flexibility.  At the end of PT session, patient had noted fatigue otherwise neurologically intact.  However this p.m. approximately 19:00 patient had a seizure at home and was administered intranasal diazepam by his family.  This was followed by a seizure for which the EMS services was called.  It is noted that after the EMS team arrived the patient continued to have seizures and was given 10 mg IM midazolam that did not immediately break break seizure activity until approximately 15 minutes later.  Neurology team was called and got a phenobarbital load.  In the ED lactic acid level returned at 16, patient presumed noncompliant with antiseizure medication.  Patient remains difficult to arouse therefore airway protection was provided via nasal trumpet.  Neurology tech present placing EEG leads.  CBC: WBC slight leukocytosis at 11.4, BMP within normal limits exception of CO2 28 glucose 122.  CT scan of the head without contrast reveals no acute abnormality neurology team following. CXR without acute pulmonary disease.  PCCM called to ED to evaluate and admit patient for continuous EEG monitoring. Neurology following..   Pertinent  Medical History  Acute encephalopathy (04/26/2022), Acute metabolic encephalopathy (04/26/2022), Acute respiratory failure with hypoxia (HCC) (08/23/2021), Aspiration pneumonia (HCC) (07/12/2018), Closed fracture of distal phalanx or phalanges of hand (02/05/2009), Encounter for screening for HIV (07/12/2018), Foot drop  (06/03/2022), Marijuana abuse (05/01/2013), MS (multiple sclerosis) (HCC), Seizure (HCC), Seizures (HCC) (01/22/2019), and Status epilepticus (HCC) (07/12/2018).   Significant Hospital Events: Including procedures, antibiotic start and stop dates in addition to other pertinent events   3/31 Phenobarbital/Depacon load in the ED, EEG continuous monitoring started 4/1 post-ictal, started antibiotics 4/2 more awake, started eating, d/c cEEG    Interval history:   No complaints, tolerating diet without nausea. Has been up walking with PT.  Tmax 99.2  Objective   Blood pressure 108/71, pulse 78, temperature 98.3 F (36.8 C), temperature source Oral, resp. rate 14, weight 67.8 kg, SpO2 96%.        Intake/Output Summary (Last 24 hours) at 04/27/2023 0721 Last data filed at 04/27/2023 0600 Gross per 24 hour  Intake 1768.52 ml  Output 1675 ml  Net 93.52 ml   Filed Weights   04/25/23 0500 04/26/23 0428 04/27/23 0500  Weight: 68.1 kg 66.8 kg 67.8 kg    Examination: Physical exam General: young man lying in bed in NAD Lungs: breathing comfortably on RA, no conversational dyspnea Cardiac: S1S2, RRR Abdomen: soft, NT Skin: warm, dry, no diffuse rashes Neuro: awake, alert, answering questions appropriately, normal speech, moving extremities   Bicarb 21  BUN 8 Cr 0.93 Blood cultures NGTD     Resolved Hospital Problem list   N/A  Assessment & Plan:  Seizure, status epilepticus-- suspect subtherapeutic valproate as cause Acute encephalopathy due to post-ictal state; improved today -depakote; added levocarnitine to reduce hyperammonemia. Now off phenobarbital. -will discuss timing of brain MRI with Neuro; needs to be able to lie flat -advance diet per SLP recommendations -OT, PT -CIR consulted -seizure precautions, ativan PRN for seizures  Multiple sclerosis, chronic gait disturbance due to  foot drop -follows OP with [facility], on Ocrevus IV every 6 months -Pt,  OT -needs brain MRI  Leukocytosis R buttocks wound; no abscess on CT- present on admission -follow blood cultures -con't vanc; deescalate pip-tazo to unasyn- treatment duration 6-14 days warranted depending on clinical response, keeping in mind he is immunocompromised due to treatment for his MS. Needs reassessment at least on 4/6 prior to d/c of antibiotics to determine duration. -frequent turns, OOB mobility to unload this area -optimize nutrition  NAGMA; suspect due to high chloride containing fluids- resolved -periodic monitoring   Best Practice (right click and "Reselect all SmartList Selections" daily)   Diet/type: dysphagia diet (see orders) DVT prophylaxis LMWH Pressure ulcer(s): present on admission - R buttock GI prophylaxis: N/A Lines: N/A Foley:  N/A Code Status:  full code Last date of multidisciplinary goals of care discussion [ ]   Labs   CBC: Recent Labs  Lab 04/24/23 2052 04/25/23 0622 04/27/23 0554  WBC 11.4* 21.5* 8.9  NEUTROABS 8.2*  --   --   HGB 14.6 14.5 14.7  HCT 45.7 43.7 43.4  MCV 100.4* 96.9 94.8  PLT 372 304 340    Basic Metabolic Panel: Recent Labs  Lab 04/24/23 2052 04/25/23 0622 04/25/23 1236 04/25/23 1649 04/26/23 0437 04/26/23 1619 04/27/23 0554  NA 141 140  --   --  138  --  140  K 4.3 3.5  --   --  3.5  --  3.8  CL 112* 110  --   --  110  --  108  CO2 20* 17*  --   --  17*  --  21*  GLUCOSE 122* 98  --   --  108*  --  103*  BUN 8 9  --   --  9  --  8  CREATININE 1.06 0.89  --   --  1.26*  --  0.93  CALCIUM 8.0* 8.4*  --   --  8.3*  --  8.5*  MG  --   --  2.0 2.2 2.1 2.0  --   PHOS  --   --  3.6 2.9 2.3* 2.8  --    GFR: Estimated Creatinine Clearance: 110.4 mL/min (by C-G formula based on SCr of 0.93 mg/dL). Recent Labs  Lab 04/24/23 2052 04/25/23 0622 04/25/23 1001 04/25/23 1229 04/27/23 0554  WBC 11.4* 21.5*  --   --  8.9  LATICACIDVEN  --  1.9 1.7 1.3  --     Liver Function Tests: Recent Labs  Lab  04/24/23 2052 04/26/23 0437 04/27/23 0554  AST 17 12* 14*  ALT 14 11 12   ALKPHOS 48 45 41  BILITOT 0.5 0.6 0.7  PROT 6.7 6.6 6.5  ALBUMIN 3.8 3.5 3.5     Critical care time:      Steffanie Dunn, DO 04/27/23 8:24 AM [facility]  For contact information, see Amion. If no response to pager, please call PCCM consult pager. After hours, 7PM- 7AM, please call Elink.

## 2023-04-27 NOTE — Progress Notes (Addendum)
 Subjective: Sitting up in chair.  States he has been compliant with his medication.  However Topamax level is low.  ROS: negative except above  Examination  Vital signs in last 24 hours: Temp:  [98.3 F (36.8 C)-99.2 F (37.3 C)] 98.3 F (36.8 C) (04/03 0800) Pulse Rate:  [58-85] 78 (04/03 0800) Resp:  [13-24] 15 (04/03 0800) BP: (92-144)/(66-127) 92/69 (04/03 0800) SpO2:  [96 %-100 %] 99 % (04/03 0800) Weight:  [67.8 kg] 67.8 kg (04/03 0500)  General: Sitting in chair, not in apparent distress Neuro: MS: Alert, oriented, follows commands CN: pupils equal and reactive,  EOMI, face symmetric, tongue midline, normal sensation over face, Motor: 5/5 strength in all 4 extremities Coordination: normal Gait: not tested  Basic Metabolic Panel: Recent Labs  Lab 04/24/23 2052 04/25/23 0622 04/25/23 1236 04/25/23 1649 04/26/23 0437 04/26/23 1619 04/27/23 0554  NA 141 140  --   --  138  --  140  K 4.3 3.5  --   --  3.5  --  3.8  CL 112* 110  --   --  110  --  108  CO2 20* 17*  --   --  17*  --  21*  GLUCOSE 122* 98  --   --  108*  --  103*  BUN 8 9  --   --  9  --  8  CREATININE 1.06 0.89  --   --  1.26*  --  0.93  CALCIUM 8.0* 8.4*  --   --  8.3*  --  8.5*  MG  --   --  2.0 2.2 2.1 2.0  --   PHOS  --   --  3.6 2.9 2.3* 2.8  --     CBC: Recent Labs  Lab 04/24/23 2052 04/25/23 0622 04/27/23 0554  WBC 11.4* 21.5* 8.9  NEUTROABS 8.2*  --   --   HGB 14.6 14.5 14.7  HCT 45.7 43.7 43.4  MCV 100.4* 96.9 94.8  PLT 372 304 340     Coagulation Studies: No results for input(s): "LABPROT", "INR" in the last 72 hours.  Imaging No new brain imaging overnight  ASSESSMENT AND PLAN: 33 year old male with history of epilepsy who presented with breakthrough seizures most likely in the setting of medication noncompliance ( Topamax level 1.7, Depakote level was not checked)  Epilepsy with breakthrough seizures Hyperammonemia -No further seizures overnight -Hyperammonemia  likely due to Depakote use   Recommendations -Increase Depakote to 250 mg in the morning and 500 mg at bedtime with addition of levocarnitine due to hyperammonemia -Topamax along with Depakote can worsen hyperammonemia.  Therefore we will try switching to Onfi 10 mg daily which can also help with mood stabilization -Counseled again about importance of medication compliance, seizure provoking factors -Rescue medication: Intranasal Valtoco 15mg  for seizure lasting more than 2 minutes  -Does not need MRI brain as patient continues to improve -Continue seizure precautions -Continue to follow-up with neurology in 2 to 3 months -Discussed plan with ICU team   Seizure precautions: Per [facility] statutes, patients with seizures are not allowed to drive until they have been seizure-free for six months and cleared by a physician    Use caution when using heavy equipment or power tools. Avoid working on ladders or at heights. Take showers instead of baths. Ensure the water temperature is not too high on the home water heater. Do not go swimming alone. Do not lock yourself in a room alone (i.e. bathroom).  When caring for infants or small children, sit down when holding, feeding, or changing them to minimize risk of injury to the child in the event you have a seizure. Maintain good sleep hygiene. Avoid alcohol.    If patient has another seizure, call 911 and bring them back to the ED if: A.  The seizure lasts longer than 5 minutes.      B.  The patient doesn't wake shortly after the seizure or has new problems such as difficulty seeing, speaking or moving following the seizure C.  The patient was injured during the seizure D.  The patient has a temperature over 102 F (39C) E.  The patient vomited during the seizure and now is having trouble breathing    During the Seizure   - First, ensure adequate ventilation and place patients on the floor on their left side  Loosen clothing around the neck  and ensure the airway is patent. If the patient is clenching the teeth, do not force the mouth open with any object as this can cause severe damage - Remove all items from the surrounding that can be hazardous. The patient may be oblivious to what's happening and may not even know what he or she is doing. If the patient is confused and wandering, either gently guide him/her away and block access to outside areas - Reassure the individual and be comforting - Call 911. In most cases, the seizure ends before EMS arrives. However, there are cases when seizures may last over 3 to 5 minutes. Or the individual may have developed breathing difficulties or severe injuries. If a pregnant patient or a person with diabetes develops a seizure, it is prudent to call an ambulance.    After the Seizure (Postictal Stage)   After a seizure, most patients experience confusion, fatigue, muscle pain and/or a headache. Thus, one should permit the individual to sleep. For the next few days, reassurance is essential. Being calm and helping reorient the person is also of importance.   Most seizures are painless and end spontaneously. Seizures are not harmful to others but can lead to complications such as stress on the lungs, brain and the heart. Individuals with prior lung problems may develop labored breathing and respiratory distress.      I have spent a total of 37   minutes with the patient reviewing hospital notes,  test results, labs and examining the patient as well as establishing an assessment and plan.  > 50% of time was spent in direct patient care.      Lindie Spruce Epilepsy [facility] For questions after 5pm please refer to AMION to reach the Neurologist on call

## 2023-04-27 NOTE — Plan of Care (Signed)
  Problem: Clinical Measurements: Goal: Ability to maintain clinical measurements within normal limits will improve Outcome: Progressing Goal: Respiratory complications will improve Outcome: Progressing Goal: Cardiovascular complication will be avoided Outcome: Progressing   Problem: Nutrition: Goal: Adequate nutrition will be maintained Outcome: Progressing   Problem: Coping: Goal: Level of anxiety will decrease Outcome: Progressing   Problem: Elimination: Goal: Will not experience complications related to urinary retention Outcome: Progressing   Problem: Pain Managment: Goal: General experience of comfort will improve and/or be controlled Outcome: Progressing

## 2023-04-27 NOTE — Progress Notes (Signed)
 IP rehab admissions - I met with patient and he is hopeful that he can discharge home with outpatient therapy rather that admitting to CIR.  I did leave booklets for him and I explained inpatient rehab.  He lives with his dad and mom.  He prefers to talk with his mom himself.  We will follow progress for now.  [phone]

## 2023-04-27 NOTE — Progress Notes (Signed)
 Ok to transition vanc to PO linezolid for wound infection per Dr. Chestine Spore.  Ulyses Southward, PharmD, BCIDP, AAHIVP, CPP Infectious Disease Pharmacist 04/27/2023 3:05 PM

## 2023-04-27 NOTE — Progress Notes (Signed)
 Pharmacy Antibiotic Note  Miguel Black is a 33 y.o. male admitted on 04/24/2023 for seizure, leukocytosis and concern for sepsis due to decubitus ulcer.  Pharmacy has been consulted for vancomycin and Unasyn dosing.  De-escalating Zosyn to Unasyn per CCM.  SCr stable.   Plan: Change to Unasyn 3g IV every 6 hours. Continue Vancomycin 750 mg IV q 8h (eAUC 461) Vancomycin levels with next dose.  Monitor fever curve and clinical status  Weight: 67.8 kg (149 lb 7.6 oz)  Temp (24hrs), Avg:98.8 F (37.1 C), Min:98.3 F (36.8 C), Max:99.2 F (37.3 C)  Recent Labs  Lab 04/24/23 2052 04/25/23 0622 04/25/23 1001 04/25/23 1229 04/26/23 0437 04/27/23 0554  WBC 11.4* 21.5*  --   --   --  8.9  CREATININE 1.06 0.89  --   --  1.26* 0.93  LATICACIDVEN  --  1.9 1.7 1.3  --   --     Estimated Creatinine Clearance: 110.4 mL/min (by C-G formula based on SCr of 0.93 mg/dL).    Allergies  Allergen Reactions   Ampyra [Dalfampridine] Other (See Comments)    Seizure 02/2021 while on dalfampridine   Link Snuffer, PharmD, BCPS, BCCCP Please refer to [facility] for [facility] Pharmacy numbers 04/27/2023 8:25 AM

## 2023-04-28 DIAGNOSIS — R569 Unspecified convulsions: Secondary | ICD-10-CM | POA: Diagnosis not present

## 2023-04-28 DIAGNOSIS — A419 Sepsis, unspecified organism: Principal | ICD-10-CM

## 2023-04-28 LAB — GLUCOSE, CAPILLARY
Glucose-Capillary: 86 mg/dL (ref 70–99)
Glucose-Capillary: 99 mg/dL (ref 70–99)
Glucose-Capillary: 96 mg/dL (ref 70–99)
Glucose-Capillary: 120 mg/dL — ABNORMAL HIGH (ref 70–99)
Glucose-Capillary: 113 mg/dL — ABNORMAL HIGH (ref 70–99)

## 2023-04-28 LAB — BASIC METABOLIC PANEL WITH GFR
Anion gap: 10 (ref 5–15)
BUN: 10 mg/dL (ref 6–20)
CO2: 23 mmol/L (ref 22–32)
Calcium: 8.7 mg/dL — ABNORMAL LOW (ref 8.9–10.3)
Chloride: 108 mmol/L (ref 98–111)
Creatinine, Ser: 0.96 mg/dL (ref 0.61–1.24)
GFR, Estimated: >60 mL/min (ref >60)
Glucose, Bld: 98 mg/dL (ref 70–99)
Potassium: 4.1 mmol/L (ref 3.5–5.1)
Sodium: 141 mmol/L (ref 135–145)

## 2023-04-28 MED ORDER — AMOXICILLIN-POT CLAVULANATE 875-125 MG PO TABS
1.0000 | ORAL_TABLET | Freq: Two times a day (BID) | ORAL | Status: DC
  Administered 2023-04-28 – 2023-05-01 (×7): 1 via ORAL
  Filled 2023-04-28 (×8): qty 1

## 2023-04-28 MED ORDER — INSULIN ASPART 100 UNIT/ML IJ SOLN
0.0000 [IU] | Freq: Three times a day (TID) | INTRAMUSCULAR | Status: DC
  Administered 2023-04-29 – 2023-04-30 (×2): 1 [IU] via SUBCUTANEOUS

## 2023-04-28 MED ORDER — INSULIN ASPART 100 UNIT/ML IJ SOLN: 0.0000 [IU] | Freq: Every day | INTRAMUSCULAR | Status: DC

## 2023-04-28 MED ORDER — ORAL CARE MOUTH RINSE: 15.0000 mL | OROMUCOSAL | Status: DC | PRN

## 2023-04-28 NOTE — Progress Notes (Signed)
 Occupational Therapy Treatment Patient Details Name: Miguel Black MRN: [id number] DOB: Jan 30, 1990 Today's Date: 04/28/2023   History of present illness Pt is a 33 y/o male admitted 04/25/23 after a seizure at home with family. CT head did not reveal acute intracranial abnormality. CXR was negative for acute pulmonary disease. Dx with acute encephalopathy. PMH includes MS, seizures. Of note: hospitalized 02/13/23-02/17/23.   OT comments  Pt is progressing towards OT goals this session, cognition seems to have improved - able to motor plan and sequence for standing oral care at sink, continues to require multimodal cues for safety with RW. When OT entered he was finishing up bath with RNs in bathroom. Pt also able to complete hallway mobility with RW with mod A for trunk control and RW management. Balance continues to be a big concern since he entered the hospital using a SPC. Pt continues to benefit from skilled OT in  the acute setting as well as short term intensive rehab of >3 hours daily to maximize safety and independence.       If plan is discharge home, recommend the following:  A little help with walking and/or transfers;A lot of help with bathing/dressing/bathroom;Assist for transportation;Help with stairs or ramp for entrance;Supervision due to cognitive status   Equipment Recommendations  Other (comment) (defer to next venue of care)    Recommendations for Other Services PT consult;Rehab consult;Speech consult    Precautions / Restrictions Precautions Precautions: Other (comment);Fall (Seizure) Recall of Precautions/Restrictions: Impaired Precaution/Restrictions Comments: L foot drop (baseline) Restrictions Weight Bearing Restrictions Per Provider Order: No       Mobility Bed Mobility               General bed mobility comments: recieved in bathroom, and in recliner at end of session    Transfers Overall transfer level: Needs assistance Equipment used: Rolling  walker (2 wheels) Transfers: Sit to/from Stand, Bed to chair/wheelchair/BSC Sit to Stand: Min assist     Step pivot transfers: Mod assist (RW safety and management)     General transfer comment: able to stand from toilet - required cues for safe hand placement. Pt able to stand from recliner with GCA sit<>stand x2     Balance Overall balance assessment: Needs assistance Sitting-balance support: Single extremity supported, Feet supported Sitting balance-Leahy Scale: Fair     Standing balance support: During functional activity, Single extremity supported Standing balance-Leahy Scale: Poor Standing balance comment: RW and external support from therapist                           ADL either performed or assessed with clinical judgement   ADL Overall ADL's : Needs assistance/impaired         Upper Body Bathing: Moderate assistance Upper Body Bathing Details (indicate cue type and reason): for back, observed, RN completing Lower Body Bathing: Moderate assistance Lower Body Bathing Details (indicate cue type and reason): knees down, observed, RN completing Upper Body Dressing : Minimal assistance Upper Body Dressing Details (indicate cue type and reason): donning fresh gown Lower Body Dressing: Moderate assistance;Sitting/lateral leans Lower Body Dressing Details (indicate cue type and reason): donning socks Toilet Transfer: Minimal assistance;Cueing for safety;Ambulation;Rolling walker (2 wheels)           Functional mobility during ADLs: Minimal assistance;Cueing for safety;Cueing for sequencing;Rolling walker (2 wheels) General ADL Comments: decreased balance, activity tolerance, and safety awareness    Extremity/Trunk Assessment Upper Extremity Assessment Upper Extremity Assessment: Overall  WFL for tasks assessed   Lower Extremity Assessment Lower Extremity Assessment: Defer to PT evaluation        Vision       Perception     Praxis      Communication Communication Communication: No apparent difficulties;Other (comment) (unsure of baseline communication) Factors Affecting Communication: Difficulty expressing self   Cognition Arousal: Alert Behavior During Therapy: WFL for tasks assessed/performed Cognition: No family/caregiver present to determine baseline, Cognition impaired     Awareness: Intellectual awareness impaired, Online awareness impaired (decreased safety awareness)       OT - Cognition Comments: Pt much more responsive to questions and giving appropriate yes/no answers. Pt laughing at jokes and making choices appropriately. Pt does have decreased safety awareness with RW (typiucally uses SPC)                 Following commands: Impaired        Cueing   Cueing Techniques: Verbal cues, Gestural cues, Tactile cues  Exercises      Shoulder Instructions       General Comments      Pertinent Vitals/ Pain       Pain Assessment Pain Assessment: No/denies pain Pain Intervention(s): Monitored during session  Home Living                                          Prior Functioning/Environment              Frequency  Min 2X/week        Progress Toward Goals  OT Goals(current goals can now be found in the care plan section)  Progress towards OT goals: Progressing toward goals  Acute Rehab OT Goals Patient Stated Goal: drink chocolate shake OT Goal Formulation: With patient Time For Goal Achievement: 05/10/23 Potential to Achieve Goals: Good  Plan      Co-evaluation                 AM-PAC OT "6 Clicks" Daily Activity     Outcome Measure   Help from another person eating meals?: A Little Help from another person taking care of personal grooming?: A Little Help from another person toileting, which includes using toliet, bedpan, or urinal?: A Lot Help from another person bathing (including washing, rinsing, drying)?: A Lot Help from another person to  put on and taking off regular upper body clothing?: A Little Help from another person to put on and taking off regular lower body clothing?: A Lot 6 Click Score: 15    End of Session Equipment Utilized During Treatment: Gait belt;Rolling walker (2 wheels)  OT Visit Diagnosis: Unsteadiness on feet (R26.81);Muscle weakness (generalized) (M62.81);Other symptoms and signs involving the nervous system (R29.898);Other symptoms and signs involving cognitive function   Activity Tolerance Patient tolerated treatment well   Patient Left in chair;with call bell/phone within reach;with chair alarm set;with nursing/sitter in room   Nurse Communication Mobility status;Precautions        Time: 1610-9604 OT Time Calculation (min): 30 min  Charges: OT General Charges $OT Visit: 1 Visit OT Treatments $Self Care/Home Management : 8-22 mins $Therapeutic Activity: 8-22 mins  Nyoka Cowden OTR/L Acute Rehabilitation Services Office: [phone]  Evern Bio [facility] 04/28/2023, 12:52 PM

## 2023-04-28 NOTE — Progress Notes (Signed)
 NAME:  Miguel Black, MRN:  [id number], DOB:  1990/12/06, LOS: 3 ADMISSION DATE:  04/24/2023, CONSULTATION DATE:  04/24/2023 REFERRING MD:  Miguel Black - EDP, CHIEF COMPLAINT:  Seizure  History of Present Illness:  Miguel Black is a 33 year old gentleman with known history for multiple sclerosis and seizures disorder.  Patient participated in physical therapy this a.m. secondary to known foot drop for increase available extremity strength and balance of flexibility.  At the end of PT session, patient had noted fatigue otherwise neurologically intact.  However this p.m. approximately 19:00 patient had a seizure at home and was administered intranasal diazepam by his family.  This was followed by a seizure for which the EMS services was called.  It is noted that after the EMS team arrived the patient continued to have seizures and was given 10 mg IM midazolam that did not immediately break break seizure activity until approximately 15 minutes later.  Neurology team was called and got a phenobarbital load.  In the ED lactic acid level returned at 16, patient presumed noncompliant with antiseizure medication.  Patient remains difficult to arouse therefore airway protection was provided via nasal trumpet.  Neurology tech present placing EEG leads.  CBC: WBC slight leukocytosis at 11.4, BMP within normal limits exception of CO2 28 glucose 122.  CT scan of the head without contrast reveals no acute abnormality neurology team following. CXR without acute pulmonary disease.  PCCM called to ED to evaluate and admit patient for continuous EEG monitoring. Neurology following..  Pertinent Medical History:   Past Medical History:  Diagnosis Date   Acute encephalopathy 04/26/2022   Acute metabolic encephalopathy 04/26/2022   Acute respiratory failure with hypoxia (HCC) 08/23/2021   Aspiration pneumonia (HCC) 07/12/2018   Closed fracture of distal phalanx or phalanges of hand 02/05/2009   Formatting of  this note might be different from the original. Overview: The original diagnosis was replaced automatically by the ICD-10 team to an ICD-10 compliant one. Please review for accuracy   Encounter for screening for HIV 07/12/2018   Foot drop 06/03/2022   Has a left foot drop ever since 2015 first flare of multiple sclerosis had a brace in 2022 custom but does not actually do anything to lift the from the foot physical therapist advised him to get an alternative foot drop brace but I cannot find record of that   Marijuana abuse 05/01/2013   MS (multiple sclerosis) (HCC)    Seizure (HCC)    Seizures (HCC) 01/22/2019   Status epilepticus (HCC) 07/12/2018   Significant Hospital Events: Including procedures, antibiotic start and stop dates in addition to other pertinent events   3/31 Phenobarbital/Depacon load in the ED, EEG continuous monitoring started 4/1 Post-ictal, started antibiotics 4/2 more awake, started eating, d/c cEEG  Interval history:   No significant events overnight Feeling well overall, cheerful and in good spirits Tolerating and diet/PO medications Ambulating with assistance with walker No further seizure activity Antibiotics transitioned to PO Stable for transfer out of ICU  Objective:   Blood pressure 91/78, pulse 81, temperature 98.4 F (36.9 C), temperature source Oral, resp. rate 18, weight 68 kg, SpO2 100%.        Intake/Output Summary (Last 24 hours) at 04/28/2023 1146 Last data filed at 04/28/2023 1100 Gross per 24 hour  Intake 750.06 ml  Output 850 ml  Net -99.94 ml   Filed Weights   04/26/23 0428 04/27/23 0500 04/28/23 0701  Weight: 66.8 kg 67.8 kg 68 kg   Physical  Examination: General: Chronically ill-appearing younger man in NAD. Pleasant and conversant. HEENT: [place]/AT, anicteric sclera, PERRL, moist mucous membranes. Neuro: Awake, oriented x 4. Slightly delayed speech, but appropriately answering all questions. Bright affect. Responds to verbal stimuli.  Following commands consistently. Moves all 4 extremities spontaneously. Generalized weakness in the setting of MS.  CV: RRR, no m/g/r. PULM: Breathing even and unlabored on RA. Lung fields CTAB. GI: Soft, nontender, nondistended. Normoactive bowel sounds. Extremities: No LE edema noted. Skin: Warm/dry, no rashes. Sacral decubitus wound as previously described.   Resolved Hospital Problem List:  N/A  Assessment & Plan:  Seizure, status epilepticus-- suspect subtherapeutic valproate as cause Acute encephalopathy due to post-ictal state; improved Hyperammonemia History of epilepsy - Neuro following, appreciate recommendations - AEDs per Neuro (Depakote + levocarnitine, Onfi), IN Valtoco PRN - Seizure precautions - Neuroprotective measures: HOB > 30 degrees, normoglycemia, normothermia, electrolytes WNL - CIR consulted - Neuro f/u in 2-3 months  Multiple sclerosis, chronic gait disturbance due to foot drop Follows OP with [place] neurology, on Ocrevus IV every 6 months - PT/OT/SLP - Can hold on MRI Brain for now, as patient significantly clinically improved - Outpatient Neuro f/u  Leukocytosis R buttocks wound; no abscess on CT- present on admission Treatment duration 6-14 days warranted depending on clinical response, keeping in mind he is immunocompromised due to treatment for his MS. - Antibiotics transitioned to PO (Augmentin/Linezolid) - Tentative end date ~4/8, needs reassessment ~4/6 prior to final decision on duration - Frequent turns, OOB/encourage mobility - Optimize nutrition/high protein diet  NAGMA; suspect due to high chloride containing fluids- resolved - Outpatient intermittent lab monitoring   Best Practice (right click and "Reselect all SmartList Selections" daily)   Diet/type: Regular consistency (see orders) DVT prophylaxis LMWH Pressure ulcer(s): present on admission - R buttock GI prophylaxis: N/A Lines: N/A Foley:  N/A Code Status:  full code Last  date of multidisciplinary goals of care discussion [4/4 - Remains full code]  Ok to transfer out of ICU today, 4/4.  Critical care time: N/A   Miguel Black [facility] 04/28/23 11:47 AM  Please see [email or web address] for pager details.  From 7A-7P if no response, please call [phone] After hours, please call ELink [phone]

## 2023-04-29 DIAGNOSIS — R569 Unspecified convulsions: Secondary | ICD-10-CM | POA: Diagnosis not present

## 2023-04-29 DIAGNOSIS — G35 Multiple sclerosis: Secondary | ICD-10-CM | POA: Diagnosis not present

## 2023-04-29 LAB — BASIC METABOLIC PANEL WITH GFR
Anion gap: 9 (ref 5–15)
BUN: 12 mg/dL (ref 6–20)
CO2: 24 mmol/L (ref 22–32)
Calcium: 8.9 mg/dL (ref 8.9–10.3)
Chloride: 110 mmol/L (ref 98–111)
Creatinine, Ser: 0.91 mg/dL (ref 0.61–1.24)
GFR, Estimated: >60 mL/min (ref >60)
Glucose, Bld: 94 mg/dL (ref 70–99)
Potassium: 4.3 mmol/L (ref 3.5–5.1)
Sodium: 143 mmol/L (ref 135–145)

## 2023-04-29 LAB — GLUCOSE, CAPILLARY
Glucose-Capillary: 101 mg/dL — ABNORMAL HIGH (ref 70–99)
Glucose-Capillary: 111 mg/dL — ABNORMAL HIGH (ref 70–99)
Glucose-Capillary: 138 mg/dL — ABNORMAL HIGH (ref 70–99)
Glucose-Capillary: 109 mg/dL — ABNORMAL HIGH (ref 70–99)

## 2023-04-29 LAB — CBC
HCT: 42.1 % (ref 39.0–52.0)
Hemoglobin: 13.8 g/dL (ref 13.0–17.0)
MCH: 32 pg (ref 26.0–34.0)
MCHC: 32.8 g/dL (ref 30.0–36.0)
MCV: 97.7 fL (ref 80.0–100.0)
Platelets: 341 10*3/uL (ref 150–400)
RBC: 4.31 MIL/uL (ref 4.22–5.81)
RDW: 14.5 % (ref 11.5–15.5)
WBC: 5.5 10*3/uL (ref 4.0–10.5)
nRBC: 0 % (ref 0.0–0.2)

## 2023-04-29 NOTE — Plan of Care (Signed)
  Problem: Clinical Measurements: Goal: Will remain free from infection Outcome: Not Progressing   Problem: Clinical Measurements: Goal: Diagnostic test results will improve Outcome: Not Progressing   Problem: Safety: Goal: Ability to remain free from injury will improve Outcome: Not Progressing   Problem: Skin Integrity: Goal: Risk for impaired skin integrity will decrease Outcome: Not Progressing   Problem: Metabolic: Goal: Ability to maintain appropriate glucose levels will improve Outcome: Not Progressing   Problem: Skin Integrity: Goal: Risk for impaired skin integrity will decrease Outcome: Not Progressing   Problem: Tissue Perfusion: Goal: Adequacy of tissue perfusion will improve Outcome: Not Progressing

## 2023-04-29 NOTE — Plan of Care (Signed)
  Problem: Education: Goal: Knowledge of General Education information will improve Description: Including pain rating scale, medication(s)/side effects and non-pharmacologic comfort measures Outcome: Progressing   Problem: Clinical Measurements: Goal: Respiratory complications will improve Outcome: Progressing Goal: Cardiovascular complication will be avoided Outcome: Progressing   Problem: Activity: Goal: Risk for activity intolerance will decrease Outcome: Progressing   Problem: Nutrition: Goal: Adequate nutrition will be maintained Outcome: Progressing   Problem: Coping: Goal: Level of anxiety will decrease Outcome: Progressing   Problem: Elimination: Goal: Will not experience complications related to urinary retention Outcome: Progressing   Problem: Skin Integrity: Goal: Risk for impaired skin integrity will decrease Outcome: Progressing   Problem: Fluid Volume: Goal: Ability to maintain a balanced intake and output will improve Outcome: Progressing

## 2023-04-29 NOTE — Progress Notes (Signed)
 Occupational Therapy Treatment Patient Details Name: Miguel Black MRN: [id number] DOB: [birth year] Today's Date: 04/29/2023   History of present illness Pt is a 33 y/o male admitted 04/25/23 after a seizure at home with family. CT head did not reveal acute intracranial abnormality. CXR was negative for acute pulmonary disease. Dx with acute encephalopathy. PMH includes MS, seizures. Of note: hospitalized 02/13/23-02/17/23.   OT comments  Pt received in supine and agreeable to therapy. Pt able to get EOB and perform sit <> stand with RW requiring CGA for support and stability. Ambulated to bathroom and majority of session spent with pt completing toileting, pt did not require assistance for hygiene and clothing management. Self care/grooming completed in standing at sink with CGA for safety. Pt noted with trouble initiating tasks and requiring cueing and increased time for following commands. Pt left in recliner with all needs met. Acute OT to continue to follow to address established goals to facilitate DC to next venue of care.        If plan is discharge home, recommend the following:  A little help with walking and/or transfers;A lot of help with bathing/dressing/bathroom;Assist for transportation;Help with stairs or ramp for entrance;Supervision due to cognitive status   Equipment Recommendations  Other (comment) (defer)    Recommendations for Other Services      Precautions / Restrictions Precautions Precautions: Fall;Other (comment) (seizure) Recall of Precautions/Restrictions: Intact Restrictions Weight Bearing Restrictions Per Provider Order: No       Mobility Bed Mobility Overal bed mobility: Needs Assistance Bed Mobility: Supine to Sit     Supine to sit: Supervision, HOB elevated     General bed mobility comments: supervision to come EOB    Transfers Overall transfer level: Needs assistance Equipment used: Rolling walker (2 wheels) Transfers: Sit to/from  Stand Sit to Stand: Contact guard assist           General transfer comment: able to stand from EOB and toilet with CGA     Balance Overall balance assessment: Needs assistance Sitting-balance support: Feet supported Sitting balance-Leahy Scale: Fair     Standing balance support: Single extremity supported, Reliant on assistive device for balance, During functional activity Standing balance-Leahy Scale: Poor Standing balance comment: reliant on RW and single extremity support                           ADL either performed or assessed with clinical judgement   ADL Overall ADL's : Needs assistance/impaired     Grooming: Wash/dry hands;Wash/dry face;Oral care;Set up;Standing;Contact guard assist;Cueing for sequencing Grooming Details (indicate cue type and reason): completed standing at sink                 Toilet Transfer: Contact guard assist;Ambulation;Rolling walker (2 wheels) Toilet Transfer Details (indicate cue type and reason): CGA for safety Toileting- Clothing Manipulation and Hygiene: Contact guard assist;Modified independent;Sit to/from stand Toileting - Clothing Manipulation Details (indicate cue type and reason): able to manage gown and complete hygiene     Functional mobility during ADLs: Contact guard assist;Rolling walker (2 wheels);Cueing for sequencing General ADL Comments: pt to toilet with max increased time, stood at sink for self care, noted with difficulty initiating tasks and required sequencing for tasks and directives    Extremity/Trunk Assessment              Vision       Perception     Praxis     Communication Communication  Communication: No apparent difficulties   Cognition Arousal: Alert Behavior During Therapy: WFL for tasks assessed/performed Cognition: Cognition impaired     Awareness: Intellectual awareness impaired     Executive functioning impairment (select all impairments): Initiation, Sequencing OT  - Cognition Comments: pt with increased time to complete/initiate tasks                 Following commands: Impaired Following commands impaired: Follows one step commands with increased time      Cueing   Cueing Techniques: Verbal cues, Gestural cues  Exercises      Shoulder Instructions       General Comments pt spent majority of session completing toileting, self care completed at sink and transferred to recliner when done    Pertinent Vitals/ Pain       Pain Assessment Pain Assessment: Faces Faces Pain Scale: No hurt Pain Intervention(s): Monitored during session  Home Living                                          Prior Functioning/Environment              Frequency  Min 2X/week        Progress Toward Goals  OT Goals(current goals can now be found in the care plan section)  Progress towards OT goals: Progressing toward goals  Acute Rehab OT Goals Patient Stated Goal: none stated OT Goal Formulation: With patient Time For Goal Achievement: 05/10/23 Potential to Achieve Goals: Good ADL Goals Pt Will Perform Grooming: with modified independence;sitting Pt Will Perform Upper Body Dressing: with modified independence;sitting Pt Will Perform Lower Body Dressing: with supervision;sit to/from stand Pt Will Transfer to Toilet: with supervision;ambulating Pt Will Perform Toileting - Clothing Manipulation and hygiene: with modified independence;sitting/lateral leans  Plan      Co-evaluation                 AM-PAC OT "6 Clicks" Daily Activity     Outcome Measure   Help from another person eating meals?: A Little Help from another person taking care of personal grooming?: A Little Help from another person toileting, which includes using toliet, bedpan, or urinal?: A Lot Help from another person bathing (including washing, rinsing, drying)?: A Lot Help from another person to put on and taking off regular upper body clothing?:  A Little Help from another person to put on and taking off regular lower body clothing?: A Lot 6 Click Score: 15    End of Session Equipment Utilized During Treatment: Gait belt;Rolling walker (2 wheels)  OT Visit Diagnosis: Unsteadiness on feet (R26.81);Muscle weakness (generalized) (M62.81);Other symptoms and signs involving the nervous system (R29.898);Other symptoms and signs involving cognitive function   Activity Tolerance Patient tolerated treatment well   Patient Left in chair;with call bell/phone within reach;Other (comment) (chair alarm sensory malfunctioning, RN notified)   Nurse Communication Mobility status        Time: [id number] OT Time Calculation (min): 69 min  Charges: OT General Charges $OT Visit: 1 Visit OT Treatments $Self Care/Home Management : 68-82 mins  Juhi Lagrange, BS, OTA/S   Brittie Whisnant 04/29/2023, 11:12 AM

## 2023-04-29 NOTE — Plan of Care (Signed)

## 2023-04-29 NOTE — Progress Notes (Signed)
 [facility]                                                                               Miguel Black, is a 33 y.o. male, DOB - May 07, 1990, ZOX:[id number] Admit date - 04/24/2023    Outpatient Primary MD for the patient is Lula Olszewski, MD  LOS - 4  days    Brief summary   33 year old gentleman with known history for multiple sclerosis and seizures disorder admitted for seizures due to non compliance. He was admitted to [facility] service for close monitoring due to possible airway compromise.  Neurology consulted and he underwent c EEG monitoring. CT scan of the head without contrast reveals no acute abnormality neurology team following. CXR without acute pulmonary disease.  Assessment & Plan    Assessment and Plan:    Status epilepticus with underlying seizure disorder ( non compliant to meds)  Acute encephalopathy due to post ictal state.  Hyperammonemia Neurology consulted and recommendations given.  Currently on clobazam 10 mg daily at bedtime, Depakote 250 mg in the morning and 500 mg at bedtime.  Therapy eval recommending CIR.    MS chronic gait disturbance due to foot drop Clinically much improved.  Follows OP with [facility] neurology, on Ocrevus IV every 6 months    Leukocytosis from right buttocks wound infection.  Appears to have resolved.  Complete the course of antibiotics.        RN Pressure Injury Documentation: Pressure Injury 04/25/23 Coccyx Mid Stage 3 -  Full thickness tissue loss. Subcutaneous fat may be visible but bone, tendon or muscle are NOT exposed. Patient has prior Pressure injury. Was orginally charted as a Stage 4, but looks like it has regressed  (Active)  04/25/23 0100  Location: Coccyx  Location Orientation: Mid  Staging: Stage 3 -  Full thickness tissue loss. Subcutaneous fat may be visible but bone, tendon or muscle are NOT exposed.  Wound Description (Comments): Patient has prior Pressure injury. Was orginally  charted as a Stage 4, but looks like it has regressed to a Stage 2; WOC updated Stage 3 at current time  Present on Admission: Yes  Dressing Type Foam - Lift dressing to assess site every shift 04/29/23 0800     Pressure Injury 04/25/23 Buttocks Right;Medial Stage 3 -  Full thickness tissue loss. Subcutaneous fat may be visible but bone, tendon or muscle are NOT exposed. Old pressure injury, orginally charted as a skin tear. Now progressed to a stage 2; WOC upda (Active)  04/25/23 0100  Location: Buttocks  Location Orientation: Right;Medial  Staging: Stage 3 -  Full thickness tissue loss. Subcutaneous fat may be visible but bone, tendon or muscle are NOT exposed.  Wound Description (Comments): Old pressure injury, orginally charted as a skin tear. Now progressed to a stage 2; WOC updated to Stage 3  Present on Admission: Yes  Dressing Type Foam - Lift dressing to assess site every shift 04/29/23 0800    Malnutrition Type:  Nutrition Problem: Inadequate oral intake Etiology: acute illness   Malnutrition Characteristics:  Signs/Symptoms: meal completion < 50%   Nutrition Interventions:  Interventions: Ensure Enlive (each supplement provides 350kcal and 20  grams of protein), Juven  Estimated body mass index is 21.71 kg/m as calculated from the following:   Height as of 03/09/23: 5\' 11"  (1.803 m).   Weight as of this encounter: 70.6 kg.  Code Status: full code.  DVT Prophylaxis:  enoxaparin (LOVENOX) injection 40 mg Start: 04/25/23 1000   Level of Care: Level of care: Progressive Family Communication: none at bedside.   Disposition Plan:     Remains inpatient appropriate:  CIR  Procedures:  C eeg.   Consultants:   NEUROLOGY PCCM.   Antimicrobials:   Anti-infectives (From admission, onward)    Start     Dose/Rate Route Frequency Ordered Stop   04/28/23 1300  amoxicillin-clavulanate (AUGMENTIN) 875-125 MG per tablet 1 tablet        1 tablet Oral Every 12 hours  04/28/23 1138 05/03/23 0959   04/27/23 2200  linezolid (ZYVOX) tablet 600 mg        600 mg Oral Every 12 hours 04/27/23 1503 05/02/23 2359   04/27/23 0915  Ampicillin-Sulbactam (UNASYN) 3 g in sodium chloride 0.9 % 100 mL IVPB  Status:  Discontinued        3 g 200 mL/hr over 30 Minutes Intravenous Every 6 hours 04/27/23 0829 04/28/23 1138   04/26/23 0000  piperacillin-tazobactam (ZOSYN) IVPB 3.375 g  Status:  Discontinued       Placed in "Followed by" Linked Group   3.375 g 12.5 mL/hr over 240 Minutes Intravenous Every 8 hours 04/25/23 1731 04/27/23 0822   04/25/23 2000  vancomycin (VANCOREADY) IVPB 750 mg/150 mL  Status:  Discontinued        750 mg 150 mL/hr over 60 Minutes Intravenous Every 8 hours 04/25/23 0943 04/27/23 1503   04/25/23 1830  piperacillin-tazobactam (ZOSYN) IVPB 3.375 g       Placed in "Followed by" Linked Group   3.375 g 100 mL/hr over 30 Minutes Intravenous  Once 04/25/23 1731 04/26/23 1257   04/25/23 1030  cefTRIAXone (ROCEPHIN) 2 g in sodium chloride 0.9 % 100 mL IVPB  Status:  Discontinued        2 g 200 mL/hr over 30 Minutes Intravenous Every 12 hours 04/25/23 0935 04/25/23 1716   04/25/23 1030  vancomycin (VANCOREADY) IVPB 1500 mg/300 mL        1,500 mg 150 mL/hr over 120 Minutes Intravenous  Once 04/25/23 0941 04/25/23 1245        Medications  Scheduled Meds:  amoxicillin-clavulanate  1 tablet Oral Q12H   cloBAZam  10 mg Oral QHS   divalproex  250 mg Oral q morning   And   divalproex  500 mg Oral QHS   enoxaparin (LOVENOX) injection  40 mg Subcutaneous Q24H   feeding supplement  237 mL Oral BID BM   insulin aspart  0-5 Units Subcutaneous QHS   insulin aspart  0-9 Units Subcutaneous TID WC   levOCARNitine  500 mg Oral BID   linezolid  600 mg Oral Q12H   multivitamin with minerals  1 tablet Oral Daily   Continuous Infusions: PRN Meds:.acetaminophen, docusate sodium, LORazepam, mouth rinse, polyethylene glycol    Subjective:   Miguel Black was seen and examined today. No new complaints.   Objective:   Vitals:   04/29/23 0546 04/29/23 0857 04/29/23 1214 04/29/23 1622  BP:  114/79 113/72 138/86  Pulse:  67 69 76  Resp:  19 19 18   Temp:  98.8 F (37.1 C) 98.6 F (37 C) 98.7 F (37.1 C)  TempSrc:  Oral Oral Oral  SpO2:  100% 99% 100%  Weight: 70.6 kg       Intake/Output Summary (Last 24 hours) at 04/29/2023 1846 Last data filed at 04/29/2023 1022 Gross per 24 hour  Intake 499 ml  Output 850 ml  Net -351 ml   Filed Weights   04/27/23 0500 04/28/23 0701 04/29/23 0546  Weight: 67.8 kg 68 kg 70.6 kg     Exam General: Alert and oriented x 3, NAD Cardiovascular: S1 S2 auscultated, no murmurs, RRR Respiratory: Clear to auscultation bilaterally, no wheezing, rales or rhonchi Gastrointestinal: Soft, nontender, nondistended, + bowel sounds Ext: no pedal edema bilaterally Neuro: AAOx3,    Data Reviewed:  I have personally reviewed following labs and imaging studies   CBC Lab Results  Component Value Date   WBC 5.5 04/29/2023   RBC 4.31 04/29/2023   HGB 13.8 04/29/2023   HCT 42.1 04/29/2023   MCV 97.7 04/29/2023   MCH 32.0 04/29/2023   PLT 341 04/29/2023   MCHC 32.8 04/29/2023   RDW 14.5 04/29/2023   LYMPHSABS 2.2 04/24/2023   MONOABS 0.7 04/24/2023   EOSABS 0.0 04/24/2023   BASOSABS 0.1 04/24/2023     Last metabolic panel Lab Results  Component Value Date   NA 143 04/29/2023   K 4.3 04/29/2023   CL 110 04/29/2023   CO2 24 04/29/2023   BUN 12 04/29/2023   CREATININE 0.91 04/29/2023   GLUCOSE 94 04/29/2023   GFRNONAA >60 04/29/2023   GFRAA 99 07/22/2019   CALCIUM 8.9 04/29/2023   PHOS 2.8 04/26/2023   PROT 6.5 04/27/2023   ALBUMIN 3.5 04/27/2023   LABGLOB 2.3 07/22/2019   AGRATIO 2.1 07/22/2019   BILITOT 0.7 04/27/2023   ALKPHOS 41 04/27/2023   AST 14 (L) 04/27/2023   ALT 12 04/27/2023   ANIONGAP 9 04/29/2023    CBG (last 3)  Recent Labs    04/29/23 0607 04/29/23 1216  04/29/23 1609  GLUCAP 101* 111* 138*      Coagulation Profile: No results for input(s): "INR", "PROTIME" in the last 168 hours.   Radiology Studies: No results found.     Kathlen Mody M.D. [facility] 04/29/2023, 6:46 PM  Available via Epic secure chat 7am-7pm After 7 pm, please refer to night coverage provider listed on amion.

## 2023-04-30 ENCOUNTER — Other Ambulatory Visit: Payer: Self-pay

## 2023-04-30 DIAGNOSIS — G35 Multiple sclerosis: Secondary | ICD-10-CM | POA: Diagnosis not present

## 2023-04-30 DIAGNOSIS — R569 Unspecified convulsions: Secondary | ICD-10-CM | POA: Diagnosis not present

## 2023-04-30 LAB — GLUCOSE, CAPILLARY
Glucose-Capillary: 98 mg/dL (ref 70–99)
Glucose-Capillary: 100 mg/dL — ABNORMAL HIGH (ref 70–99)
Glucose-Capillary: 123 mg/dL — ABNORMAL HIGH (ref 70–99)
Glucose-Capillary: 121 mg/dL — ABNORMAL HIGH (ref 70–99)

## 2023-04-30 LAB — CULTURE, BLOOD (ROUTINE X 2)
Culture: NO GROWTH
Culture: NO GROWTH

## 2023-04-30 NOTE — Plan of Care (Signed)
  Problem: Education: Goal: Knowledge of General Education information will improve Description: Including pain rating scale, medication(s)/side effects and non-pharmacologic comfort measures Outcome: Progressing   Problem: Clinical Measurements: Goal: Respiratory complications will improve Outcome: Progressing Goal: Cardiovascular complication will be avoided Outcome: Progressing   Problem: Activity: Goal: Risk for activity intolerance will decrease Outcome: Progressing   Problem: Nutrition: Goal: Adequate nutrition will be maintained Outcome: Progressing   Problem: Elimination: Goal: Will not experience complications related to bowel motility Outcome: Progressing Goal: Will not experience complications related to urinary retention Outcome: Progressing   Problem: Fluid Volume: Goal: Ability to maintain a balanced intake and output will improve Outcome: Progressing   Problem: Skin Integrity: Goal: Risk for impaired skin integrity will decrease Outcome: Progressing

## 2023-04-30 NOTE — Progress Notes (Signed)
 [facility]                                                                               Miguel Black, is a 33 y.o. male, DOB - 18-Mar-1990, NWG:[id number] Admit date - 04/24/2023    Outpatient Primary MD for the patient is Lula Olszewski, MD  LOS - 5  days    Brief summary   33 year old gentleman with known history for multiple sclerosis and seizures disorder admitted for seizures due to non compliance. He was admitted to [facility] service for close monitoring due to possible airway compromise.  Neurology consulted and he underwent c EEG monitoring. CT scan of the head without contrast reveals no acute abnormality neurology team following. CXR without acute pulmonary disease.  Assessment & Plan    Assessment and Plan:    Status epilepticus with underlying seizure disorder ( non compliant to meds)  Acute encephalopathy due to post ictal state on admission. .  Hyperammonemia Neurology consulted and recommendations given.  Currently on clobazam 10 mg daily at bedtime, Depakote 250 mg in the morning and 500 mg at bedtime.  Therapy eval recommending CIR.  Patient is alert and wants to go home.  Trying to reach his mom to discuss disposition. Unable to reach her.    MS chronic gait disturbance due to foot drop Clinically much improved.  Follows OP with [facility] neurology, on Ocrevus IV every 6 months    Leukocytosis from right buttocks wound infection.  Appears to have resolved.  Complete the course of antibiotics. He is on Linezolid and augmentin.    Constipation On miralax.        RN Pressure Injury Documentation: Pressure Injury 04/25/23 Coccyx Mid Stage 3 -  Full thickness tissue loss. Subcutaneous fat may be visible but bone, tendon or muscle are NOT exposed. Patient has prior Pressure injury. Was orginally charted as a Stage 4, but looks like it has regressed  (Active)  04/25/23 0100  Location: Coccyx  Location Orientation: Mid  Staging: Stage 3  -  Full thickness tissue loss. Subcutaneous fat may be visible but bone, tendon or muscle are NOT exposed.  Wound Description (Comments): Patient has prior Pressure injury. Was orginally charted as a Stage 4, but looks like it has regressed to a Stage 2; WOC updated Stage 3 at current time  Present on Admission: Yes  Dressing Type Foam - Lift dressing to assess site every shift 04/30/23 0802     Pressure Injury 04/25/23 Buttocks Right;Medial Stage 3 -  Full thickness tissue loss. Subcutaneous fat may be visible but bone, tendon or muscle are NOT exposed. Old pressure injury, orginally charted as a skin tear. Now progressed to a stage 2; WOC upda (Active)  04/25/23 0100  Location: Buttocks  Location Orientation: Right;Medial  Staging: Stage 3 -  Full thickness tissue loss. Subcutaneous fat may be visible but bone, tendon or muscle are NOT exposed.  Wound Description (Comments): Old pressure injury, orginally charted as a skin tear. Now progressed to a stage 2; WOC updated to Stage 3  Present on Admission: Yes  Dressing Type Foam - Lift dressing to assess site every shift 04/30/23 0802  Malnutrition Type:  Nutrition Problem: Inadequate oral intake Etiology: acute illness   Malnutrition Characteristics:  Signs/Symptoms: meal completion < 50%   Nutrition Interventions:  Interventions: Ensure Enlive (each supplement provides 350kcal and 20 grams of protein), Juven  Estimated body mass index is 23.67 kg/m as calculated from the following:   Height as of this encounter: 5\' 8"  (1.727 m).   Weight as of this encounter: 70.6 kg.  Code Status: full code.  DVT Prophylaxis:  enoxaparin (LOVENOX) injection 40 mg Start: 04/25/23 1000   Level of Care: Level of care: Progressive Family Communication: none at bedside.   Disposition Plan:     Remains inpatient appropriate:  CIR  Procedures:  C eeg.   Consultants:   NEUROLOGY PCCM.   Antimicrobials:   Anti-infectives (From  admission, onward)    Start     Dose/Rate Route Frequency Ordered Stop   04/28/23 1300  amoxicillin-clavulanate (AUGMENTIN) 875-125 MG per tablet 1 tablet        1 tablet Oral Every 12 hours 04/28/23 1138 05/03/23 0959   04/27/23 2200  linezolid (ZYVOX) tablet 600 mg        600 mg Oral Every 12 hours 04/27/23 1503 05/02/23 2359   04/27/23 0915  Ampicillin-Sulbactam (UNASYN) 3 g in sodium chloride 0.9 % 100 mL IVPB  Status:  Discontinued        3 g 200 mL/hr over 30 Minutes Intravenous Every 6 hours 04/27/23 0829 04/28/23 1138   04/26/23 0000  piperacillin-tazobactam (ZOSYN) IVPB 3.375 g  Status:  Discontinued       Placed in "Followed by" Linked Group   3.375 g 12.5 mL/hr over 240 Minutes Intravenous Every 8 hours 04/25/23 1731 04/27/23 0822   04/25/23 2000  vancomycin (VANCOREADY) IVPB 750 mg/150 mL  Status:  Discontinued        750 mg 150 mL/hr over 60 Minutes Intravenous Every 8 hours 04/25/23 0943 04/27/23 1503   04/25/23 1830  piperacillin-tazobactam (ZOSYN) IVPB 3.375 g       Placed in "Followed by" Linked Group   3.375 g 100 mL/hr over 30 Minutes Intravenous  Once 04/25/23 1731 04/26/23 1257   04/25/23 1030  cefTRIAXone (ROCEPHIN) 2 g in sodium chloride 0.9 % 100 mL IVPB  Status:  Discontinued        2 g 200 mL/hr over 30 Minutes Intravenous Every 12 hours 04/25/23 0935 04/25/23 1716   04/25/23 1030  vancomycin (VANCOREADY) IVPB 1500 mg/300 mL        1,500 mg 150 mL/hr over 120 Minutes Intravenous  Once 04/25/23 0941 04/25/23 1245        Medications  Scheduled Meds:  amoxicillin-clavulanate  1 tablet Oral Q12H   cloBAZam  10 mg Oral QHS   divalproex  250 mg Oral q morning   And   divalproex  500 mg Oral QHS   enoxaparin (LOVENOX) injection  40 mg Subcutaneous Q24H   feeding supplement  237 mL Oral BID BM   insulin aspart  0-5 Units Subcutaneous QHS   insulin aspart  0-9 Units Subcutaneous TID WC   levOCARNitine  500 mg Oral BID   linezolid  600 mg Oral Q12H    multivitamin with minerals  1 tablet Oral Daily   Continuous Infusions: PRN Meds:.acetaminophen, docusate sodium, LORazepam, mouth rinse, polyethylene glycol    Subjective:   Miguel Black was seen and examined today. No new complaints.   Objective:   Vitals:   04/30/23 0809 04/30/23 1112 04/30/23 1554 04/30/23  1606  BP: (!) 140/85 108/64 130/76   Pulse: 78 69 76   Resp: 16 18 16    Temp: 97.8 F (36.6 C) 98.3 F (36.8 C) 98.2 F (36.8 C)   TempSrc: Oral Oral Oral   SpO2: 100% 99% 99%   Weight:      Height:    5\' 8"  (1.727 m)    Intake/Output Summary (Last 24 hours) at 04/30/2023 1805 Last data filed at 04/30/2023 0856 Gross per 24 hour  Intake 450 ml  Output 600 ml  Net -150 ml   Filed Weights   04/27/23 0500 04/28/23 0701 04/29/23 0546  Weight: 67.8 kg 68 kg 70.6 kg     Exam General exam: Appears calm and comfortable  Respiratory system: Clear to auscultation. Respiratory effort normal. Cardiovascular system: S1 & S2 heard, RRR. No JVD, Gastrointestinal system: Abdomen is nondistended, soft and nontender.  Central nervous system: Alert and oriented.  Extremities: no edema.    Data Reviewed:  I have personally reviewed following labs and imaging studies   CBC Lab Results  Component Value Date   WBC 5.5 04/29/2023   RBC 4.31 04/29/2023   HGB 13.8 04/29/2023   HCT 42.1 04/29/2023   MCV 97.7 04/29/2023   MCH 32.0 04/29/2023   PLT 341 04/29/2023   MCHC 32.8 04/29/2023   RDW 14.5 04/29/2023   LYMPHSABS 2.2 04/24/2023   MONOABS 0.7 04/24/2023   EOSABS 0.0 04/24/2023   BASOSABS 0.1 04/24/2023     Last metabolic panel Lab Results  Component Value Date   NA 143 04/29/2023   K 4.3 04/29/2023   CL 110 04/29/2023   CO2 24 04/29/2023   BUN 12 04/29/2023   CREATININE 0.91 04/29/2023   GLUCOSE 94 04/29/2023   GFRNONAA >60 04/29/2023   GFRAA 99 07/22/2019   CALCIUM 8.9 04/29/2023   PHOS 2.8 04/26/2023   PROT 6.5 04/27/2023   ALBUMIN 3.5 04/27/2023    LABGLOB 2.3 07/22/2019   AGRATIO 2.1 07/22/2019   BILITOT 0.7 04/27/2023   ALKPHOS 41 04/27/2023   AST 14 (L) 04/27/2023   ALT 12 04/27/2023   ANIONGAP 9 04/29/2023    CBG (last 3)  Recent Labs    04/30/23 0620 04/30/23 1113 04/30/23 1735  GLUCAP 98 100* 123*      Coagulation Profile: No results for input(s): "INR", "PROTIME" in the last 168 hours.   Radiology Studies: No results found.     Kathlen Mody M.D. [facility] 04/30/2023, 6:05 PM  Available via Epic secure chat 7am-7pm After 7 pm, please refer to night coverage provider listed on amion.

## 2023-05-01 ENCOUNTER — Ambulatory Visit: Admitting: Physical Therapy

## 2023-05-01 ENCOUNTER — Encounter: Payer: Self-pay | Admitting: Physical Therapy

## 2023-05-01 DIAGNOSIS — G40901 Epilepsy, unspecified, not intractable, with status epilepticus: Secondary | ICD-10-CM | POA: Diagnosis not present

## 2023-05-01 LAB — GLUCOSE, CAPILLARY
Glucose-Capillary: 103 mg/dL — ABNORMAL HIGH (ref 70–99)
Glucose-Capillary: 101 mg/dL — ABNORMAL HIGH (ref 70–99)

## 2023-05-01 MED ORDER — LEVOCARNITINE 1 GM/10ML PO SOLN: 500.0000 mg | Freq: Two times a day (BID) | ORAL | 12 refills | Status: AC

## 2023-05-01 MED ORDER — CLOBAZAM 10 MG PO TABS: 10.0000 mg | ORAL_TABLET | Freq: Every day | ORAL | 1 refills | Status: DC

## 2023-05-01 MED ORDER — DIVALPROEX SODIUM 250 MG PO DR TAB
DELAYED_RELEASE_TABLET | ORAL | 2 refills | Status: DC
Start: 2023-05-01 — End: 2023-05-30

## 2023-05-01 MED ORDER — ENSURE ENLIVE PO LIQD: 237.0000 mL | Freq: Two times a day (BID) | ORAL | 2 refills | Status: AC

## 2023-05-01 MED ORDER — AMOXICILLIN-POT CLAVULANATE 875-125 MG PO TABS: 1.0000 | ORAL_TABLET | Freq: Two times a day (BID) | ORAL | 0 refills | Status: AC

## 2023-05-01 MED ORDER — ADULT MULTIVITAMIN W/MINERALS CH: 1.0000 | ORAL_TABLET | Freq: Every day | ORAL | Status: AC

## 2023-05-01 MED ORDER — LINEZOLID 600 MG PO TABS: 600.0000 mg | ORAL_TABLET | Freq: Two times a day (BID) | ORAL | 0 refills | Status: AC

## 2023-05-01 MED ORDER — DOCUSATE SODIUM 100 MG PO CAPS: 100.0000 mg | ORAL_CAPSULE | Freq: Two times a day (BID) | ORAL | 0 refills | Status: AC | PRN

## 2023-05-01 NOTE — Plan of Care (Signed)
Compliant with care plan

## 2023-05-01 NOTE — Care Management Important Message (Signed)
 Important Message  Patient Details  Name: Miguel Black MRN: [id number] Date of Birth: 01-14-1991   Important Message Given:  Yes - Medicare IM     Dorena Bodo 05/01/2023, 3:17 PM

## 2023-05-01 NOTE — Therapy (Signed)
 [place] [facility] [address], [address] [place], [place], [address] Phone: [phone]   Fax:  [phone]  Patient Details  Name: Miguel Black MRN: [id number] Date of Birth: 06/11/1990 Referring Provider:  No ref. provider found  Encounter Date: 05/01/2023  PHYSICAL THERAPY DISCHARGE SUMMARY  Visits from Start of Care: 6  Current functional level related to goals / functional outcomes: Pt had met 2 of 2 STGs; LTGs unable to be assessed due to pt being hospitalized.   Remaining deficits: Balance, strength, functional mobility and gait   Education / Equipment: HEP progressions   Patient agrees to discharge. Patient goals were not met. Patient is being discharged due to a change in medical status.  Pt hospitalized 04/24/23-present.   Saavi Mceachron W., PT 05/01/2023, 9:57 AM  [place] [facility] [address] [place], [place], [address] Phone: [phone]   Fax:  [phone]

## 2023-05-01 NOTE — Progress Notes (Signed)
   05/01/23 0900  Spiritual Encounters  Type of Visit Initial  Care provided to: Patient  Conversation partners present during encounter Other (comment) (Physical Therapist)  Reason for visit Advance directives   Chaplain responded to consult request for HCPOA. Patient stated that he is not interested in to receive the education and fill out the document.  Chaplain will be available if needed.  M.Kubra Delano Metz Resident [phone]

## 2023-05-01 NOTE — Progress Notes (Signed)
 Inpatient Rehab Admissions Coordinator:  Note pt discharging home. AC will sign off.   Wolfgang Phoenix, MS, CCC-SLP Admissions Coordinator [phone]

## 2023-05-01 NOTE — Progress Notes (Deleted)
    Durable Medical Equipment  (From admission, onward)           Start     Ordered   05/01/23 0947  For home use only DME lightweight manual wheelchair with seat cushion  Once       Comments: Patient suffers from knee fracture/ weakness which impairs their ability to perform daily activities like bathing, dressing, and grooming in the home.  A walker will not resolve  issue with performing activities of daily living. A wheelchair will allow patient to safely perform daily activities. Patient is not able to propel themselves in the home using a standard weight wheelchair due to general weakness. Patient can self propel in the lightweight wheelchair. Length of need 6 months . Accessories: elevating leg rests (ELRs), wheel locks, extensions and anti-tippers.   05/01/23 1610

## 2023-05-01 NOTE — TOC Transition Note (Signed)
 Transition of Care [facility]) - Discharge Note   Patient Details  Name: Miguel Black MRN: [id number] Date of Birth: 1990/07/14  [facility] CM/SW Contact:  Kermit Balo, RN Phone Number: 05/01/2023, 10:50 AM   Clinical Narrative:     Pt is discharging home with resumption of outpt therapy at [facility]. Information on the AVS.  Pt with questions about food stamps. CM has updated him to go online to apply.  Pt states his mother will provide transportation home today.  Final next level of care: OP Rehab Barriers to Discharge: No Barriers Identified   Patient Goals and CMS Choice   CMS Medicare.gov Compare Post Acute Care list provided to:: Patient Choice offered to / list presented to : Patient      Discharge Placement                       Discharge Plan and Services Additional resources added to the After Visit Summary for                                       Social Drivers of Health (SDOH) Interventions SDOH Screenings   Food Insecurity: No Food Insecurity (04/30/2023)  Housing: Low Risk  (04/30/2023)  Transportation Needs: No Transportation Needs (04/30/2023)  Utilities: Not At Risk (04/30/2023)  Depression (PHQ2-9): Low Risk  (06/03/2022)  Social Connections: Unknown (10/13/2021)   Received from [facility]  Tobacco Use: High Risk (04/24/2023)     Readmission Risk Interventions    06/15/2022    2:41 PM  Readmission Risk Prevention Plan  Transportation Screening Complete  PCP or Specialist Appt within 5-7 Days Complete  Home Care Screening Complete  Medication Review (RN CM) Referral to Pharmacy

## 2023-05-01 NOTE — Progress Notes (Signed)
 Physical Therapy Treatment Patient Details Name: Miguel Black MRN: [id number] DOB: 1990/03/24 Today's Date: 05/01/2023   History of Present Illness Pt is a 33 y/o male admitted 04/25/23 after a seizure at home with family. CT head did not reveal acute intracranial abnormality. CXR was negative for acute pulmonary disease. Dx with acute encephalopathy. PMH includes MS, seizures. Of note: hospitalized 02/13/23-02/17/23.    PT Comments  Patient progressing with ambulation and able to negotiate flight of steps to access bedroom at home with S to CGA.  Discussed family assist on stairs initially and reinforced safety with sequencing and step to pattern.  Needing frequent cues for posture and walker proximity due to decreased STM and fatigue.  Patient stable for home with family support and follow up outpatient PT at d/c.  No current equipment needs.     If plan is discharge home, recommend the following: Assistance with cooking/housework;Assist for transportation;Help with stairs or ramp for entrance   Can travel by private vehicle        Equipment Recommendations  None recommended by PT    Recommendations for Other Services       Precautions / Restrictions Precautions Precautions: Fall Recall of Precautions/Restrictions: Impaired Precaution/Restrictions Comments: L foot drop (baseline)     Mobility  Bed Mobility Overal bed mobility: Needs Assistance Bed Mobility: Supine to Sit     Supine to sit: Supervision, HOB elevated     General bed mobility comments: increased time, some ataxia noted    Transfers Overall transfer level: Needs assistance Equipment used: Rolling walker (2 wheels) Transfers: Sit to/from Stand Sit to Stand: Supervision           General transfer comment: cues for safety, to wait for PT for set up    Ambulation/Gait Ambulation/Gait assistance: Contact guard assist, Supervision Gait Distance (Feet): 250 Feet Assistive device: Rolling walker (2  wheels) Gait Pattern/deviations: Step-through pattern, Decreased stance time - left, Decreased dorsiflexion - left, Wide base of support, Ataxic, Trunk flexed, Steppage       General Gait Details: mild ataxia, keeping L foot abducted and externally rotated and using steppage for clearance, cues throughout for posture, walker proximity and occasional CGA for balance   Stairs Stairs: Yes Stairs assistance: Contact guard assist Stair Management: One rail Left, Step to pattern, Forwards, With cane Number of Stairs: 10 General stair comments: Educated on sequence using R (good) leg first to ascend and L (weak) leg first to descend and for step to sequence; pt preference for step over step at times and descending with R first with decreased safety; reinforced for parents to assist initially with stairs   Wheelchair Mobility     Tilt Bed    Modified Rankin (Stroke Patients Only)       Balance Overall balance assessment: Needs assistance   Sitting balance-Leahy Scale: Good     Standing balance support: Single extremity supported, No upper extremity supported, Bilateral upper extremity supported Standing balance-Leahy Scale: Fair Standing balance comment: static standing to wash hands at sink with S; using cane for ambulation briefly with difficulty so reliant on bilat UE support for ambulation                            Communication Communication Communication: No apparent difficulties Factors Affecting Communication:  (speech is slow but clear)  Cognition Arousal: Alert Behavior During Therapy: WFL for tasks assessed/performed   PT - Cognitive impairments: Problem solving, Attention, Memory,  Safety/Judgement                       PT - Cognition Comments: sustained attention, decreased STM needing frequent reminders, slow processing and decreased safety awareness Following commands: Intact Following commands impaired: Only follows one step commands  consistently    Cueing Cueing Techniques: Verbal cues, Visual cues  Exercises      General Comments General comments (skin integrity, edema, etc.): Toileted in bathroom distance S and washed hands with close S.      Pertinent Vitals/Pain Pain Assessment Pain Assessment: Faces Faces Pain Scale: Hurts a little bit Pain Location: L Side due to MS Pain Descriptors / Indicators: Discomfort Pain Intervention(s): Monitored during session    Home Living                          Prior Function            PT Goals (current goals can now be found in the care plan section) Progress towards PT goals: Progressing toward goals    Frequency    Min 2X/week      PT Plan      Co-evaluation              AM-PAC PT "6 Clicks" Mobility   Outcome Measure  Help needed turning from your back to your side while in a flat bed without using bedrails?: None Help needed moving from lying on your back to sitting on the side of a flat bed without using bedrails?: None Help needed moving to and from a bed to a chair (including a wheelchair)?: A Little Help needed standing up from a chair using your arms (e.g., wheelchair or bedside chair)?: A Little Help needed to walk in hospital room?: A Little Help needed climbing 3-5 steps with a railing? : A Little 6 Click Score: 20    End of Session Equipment Utilized During Treatment: Gait belt Activity Tolerance: Patient tolerated treatment well Patient left: in bed;with bed alarm set;with call bell/phone within reach   PT Visit Diagnosis: Other abnormalities of gait and mobility (R26.89)     Time: 7829-5621 PT Time Calculation (min) (ACUTE ONLY): 33 min  Charges:    $Gait Training: 23-37 mins PT General Charges $$ ACUTE PT VISIT: 1 Visit                     Sheran Lawless, PT Acute Rehabilitation Services Office:[phone] 05/01/2023    Miguel Black 05/01/2023, 11:02 AM

## 2023-05-01 NOTE — Progress Notes (Signed)
Patient transferred to discharge lounge

## 2023-05-01 NOTE — Progress Notes (Signed)
 Occupational Therapy Treatment Patient Details Name: Miguel Black MRN: [id number] DOB: [birth year] Today's Date: 05/01/2023   History of present illness Pt is a 33 y/o male admitted 04/25/23 after a seizure at home with family. CT head did not reveal acute intracranial abnormality. CXR was negative for acute pulmonary disease. Dx with acute encephalopathy. PMH includes MS, seizures. Of note: hospitalized 02/13/23-02/17/23.   OT comments  Patient to d/c today per chart.  Patient does not need any equipment aside from a wheelchair that has been requested by TOC.  Patient fatigued from PT session so activity limited with OT.  Patient appears more oriented to situation.  Ataxia noted in BUE with activity.  He is able to leverage his strengths to complete activities but it does take increased time to complete all adls.  It is difficult to determine his true baseline.  He would prefer to return to OP PT.   No OT recommended at this time.  If the OP PT feels it there is a need they can pursue getting an order.       If plan is discharge home, recommend the following:  A little help with walking and/or transfers;A little help with bathing/dressing/bathroom;Direct supervision/assist for medications management;Assist for transportation;Help with stairs or ramp for entrance;Supervision due to cognitive status   Equipment Recommendations  None recommended by OT (Wheelchair has been requested)    Recommendations for Other Services      Precautions / Restrictions Precautions Precautions: Fall;Other (comment) Recall of Precautions/Restrictions: Intact Precaution/Restrictions Comments: L foot drop (baseline) Restrictions Weight Bearing Restrictions Per Provider Order: No       Mobility Bed Mobility                    Transfers                         Balance                                           ADL either performed or assessed with clinical judgement    ADL Overall ADL's : Needs assistance/impaired     Grooming: Wash/dry face;Oral care;Supervision/safety                               Functional mobility during ADLs: Supervision/safety;Rolling walker (2 wheels) General ADL Comments: Patient states he feels back to his baseline, however he overall is supervision to min a with badls.  He also fatigues with activity.  Patient had just finished with PT and reported feeling pretty tired    Extremity/Trunk Assessment Upper Extremity Assessment Upper Extremity Assessment: RUE deficits/detail;LUE deficits/detail;Left hand dominant (Uses RUE for several adl tasks as it is les ataxic) RUE Coordination: decreased gross motor;decreased fine motor (slight ataxia with finger to nose) LUE Coordination: decreased fine motor;decreased gross motor (min ataxia with finger to nose)            Vision       Perception     Praxis     Communication Communication Communication: Impaired;No apparent difficulties Factors Affecting Communication:  (speech is slow but clear)   Cognition Arousal: Alert Behavior During Therapy: WFL for tasks assessed/performed Cognition: Cognition impaired     Awareness: Intellectual awareness impaired     Executive functioning impairment (select all impairments):  Initiation, Sequencing OT - Cognition Comments: Appears more oriented to situation and medical history from past notes                 Following commands: Intact Following commands impaired: Only follows one step commands consistently      Cueing   Cueing Techniques: Verbal cues  Exercises      Shoulder Instructions       General Comments      Pertinent Vitals/ Pain       Pain Assessment Pain Assessment: No/denies pain  Home Living                                          Prior Functioning/Environment              Frequency  Min 2X/week        Progress Toward Goals  OT Goals(current  goals can now be found in the care plan section)  Progress towards OT goals: Progressing toward goals  Acute Rehab OT Goals Patient Stated Goal: He wants to go home OT Goal Formulation: With patient Time For Goal Achievement: 05/10/23 Potential to Achieve Goals: Good ADL Goals Pt Will Perform Grooming: with modified independence Pt Will Perform Upper Body Dressing: with modified independence;sitting Pt Will Perform Lower Body Dressing: with supervision;sit to/from stand Pt Will Transfer to Toilet: with supervision;ambulating Pt Will Perform Toileting - Clothing Manipulation and hygiene: with modified independence;sitting/lateral leans  Plan      Co-evaluation                 AM-PAC OT "6 Clicks" Daily Activity     Outcome Measure   Help from another person eating meals?: A Little Help from another person taking care of personal grooming?: A Little Help from another person toileting, which includes using toliet, bedpan, or urinal?: A Little Help from another person bathing (including washing, rinsing, drying)?: A Little Help from another person to put on and taking off regular upper body clothing?: A Little Help from another person to put on and taking off regular lower body clothing?: A Little 6 Click Score: 18    End of Session    OT Visit Diagnosis: Unsteadiness on feet (R26.81);Muscle weakness (generalized) (M62.81);Other symptoms and signs involving the nervous system (R29.898);Other symptoms and signs involving cognitive function   Activity Tolerance Patient tolerated treatment well   Patient Left in bed;with call bell/phone within reach;with bed alarm set   Nurse Communication Mobility status        Time: 1020-1030 OT Time Calculation (min): 10 min  Charges: OT General Charges $OT Visit: 1 Visit OT Treatments $Self Care/Home Management : 8-22 mins  Hal Neer OTR/L   Malachi Bonds 05/01/2023, 10:44 AM

## 2023-05-02 ENCOUNTER — Telehealth: Payer: Self-pay | Admitting: *Deleted

## 2023-05-02 NOTE — Telephone Encounter (Signed)
 Called pt at [phone]. He reports he just got out of hospital. Cx appt w/ Dr. Epimenio Foot d/t transferring care to [facility]. He also asked we cx Ocrevus infusion with [facility]. I sent message to [facility] to cx pt.  Encouraged pt to call back if anything changes.

## 2023-05-03 ENCOUNTER — Telehealth: Payer: Self-pay

## 2023-05-03 ENCOUNTER — Telehealth: Payer: Self-pay | Admitting: Internal Medicine

## 2023-05-03 NOTE — Telephone Encounter (Signed)
 Copied from CRM [id number]. Topic: General - Other >> May 03, 2023 12:33 PM Danika B wrote: Reason for CRM: Patients mother called in stating that Dr. Jon Billings requested she let him know if the Neurology office that he referred her son to did not contact them. Stated that no one ever called and she would like that message relayed to Dr. Jon Billings as he requested. Stated that she would like a call from his nurse.  Callback ([phone]  Please see CRM call msg and advise. Showing several referrals sent for patient for Neurology, however notes in referral shows order cancelled. Please see note in referral below from [employer] and advise.  Closing this one and working the one for pain management instead since provider requested we change it

## 2023-05-03 NOTE — Discharge Summary (Signed)
 Physician Discharge Summary   Patient: Miguel Black MRN: [id number] DOB: [birth year]  Admit date:     04/24/2023  Discharge date: 05/01/2023  Discharge Physician: Kathlen Mody   PCP: Lula Olszewski, MD   Recommendations at discharge:  Please follow up with neurology as recommended.  Please follow up with PT/OT as recommended.   Discharge Diagnoses: Principal Problem:   Seizure [facility])   Hospital Course: 33 year old gentleman with known history for multiple sclerosis and seizures disorder admitted for seizures due to non compliance. He was admitted to [facility] service for close monitoring due to possible airway compromise.  Neurology consulted and he underwent c EEG monitoring. CT scan of the head without contrast reveals no acute abnormality neurology team following. CXR without acute pulmonary disease.  Assessment and Plan:  Status epilepticus with underlying seizure disorder ( non compliant to meds)  Acute encephalopathy due to post ictal state on admission. .  Hyperammonemia Neurology consulted and recommendations given.  Currently on clobazam 10 mg daily at bedtime, Depakote 250 mg in the morning and 500 mg at bedtime.  Therapy eval recommending CIR.  Patient is alert and wants to go home.  Trying to reach his mom to discuss disposition.     MS chronic gait disturbance due to foot drop Clinically much improved.  Follows OP with [place] neurology, on Ocrevus IV every 6 months      Leukocytosis from right buttocks wound infection.  Appears to have resolved.  Complete the course of antibiotics. He is on Linezolid and augmentin.      Constipation On miralax.            RN Pressure Injury Documentation:     Pressure Injury 04/25/23 Coccyx Mid Stage 3 -  Full thickness tissue loss. Subcutaneous fat may be visible but bone, tendon or muscle are NOT exposed. Patient has prior Pressure injury. Was orginally charted as a Stage 4, but looks like it has regressed  (Active)   04/25/23 0100  Location: Coccyx  Location Orientation: Mid  Staging: Stage 3 -  Full thickness tissue loss. Subcutaneous fat may be visible but bone, tendon or muscle are NOT exposed.  Wound Description (Comments): Patient has prior Pressure injury. Was orginally charted as a Stage 4, but looks like it has regressed to a Stage 2; WOC updated Stage 3 at current time  Present on Admission: Yes  Dressing Type Foam - Lift dressing to assess site every shift 04/30/23 0802     Pressure Injury 04/25/23 Buttocks Right;Medial Stage 3 -  Full thickness tissue loss. Subcutaneous fat may be visible but bone, tendon or muscle are NOT exposed. Old pressure injury, orginally charted as a skin tear. Now progressed to a stage 2; WOC upda (Active)  04/25/23 0100  Location: Buttocks  Location Orientation: Right;Medial  Staging: Stage 3 -  Full thickness tissue loss. Subcutaneous fat may be visible but bone, tendon or muscle are NOT exposed.  Wound Description (Comments): Old pressure injury, orginally charted as a skin tear. Now progressed to a stage 2; WOC updated to Stage 3  Present on Admission: Yes  Dressing Type Foam - Lift dressing to assess site every shift 04/30/23 0802      Malnutrition Type:   Nutrition Problem: Inadequate oral intake Etiology: acute illness         Consultants: neurology Procedures performed: EEG  Disposition: Home Diet recommendation:  Discharge Diet Orders (From admission, onward)     Start     Ordered  05/01/23 0000  Diet - low sodium heart healthy        05/01/23 0932           Regular diet DISCHARGE MEDICATION: Allergies as of 05/01/2023       Reactions   Ampyra [dalfampridine] Other (See Comments)   Seizure 02/2021 while on dalfampridine        Medication List     STOP taking these medications    topiramate 100 MG tablet Commonly known as: TOPAMAX       TAKE these medications    amoxicillin-clavulanate 875-125 MG tablet Commonly known  as: AUGMENTIN Take 1 tablet by mouth every 12 (twelve) hours for 2 days.   cloBAZam 10 MG tablet Commonly known as: ONFI Take 1 tablet (10 mg total) by mouth at bedtime.   divalproex 250 MG DR tablet Commonly known as: DEPAKOTE Take 1 tablet (250 mg total) by mouth every morning AND 2 tablets (500 mg total) at bedtime. What changed: See the new instructions.   docusate sodium 100 MG capsule Commonly known as: COLACE Take 1 capsule (100 mg total) by mouth 2 (two) times daily as needed for mild constipation.   feeding supplement Liqd Take 237 mLs by mouth 2 (two) times daily between meals.   levOCARNitine 1 GM/10ML solution Commonly known as: CARNITOR Take 5 mLs (500 mg total) by mouth 2 (two) times daily.   multivitamin with minerals Tabs tablet Take 1 tablet by mouth daily.   Ocrevus 300 MG/10ML injection Generic drug: ocrelizumab Inject 20 mLs (600 mg total) into the vein every 6 (six) months.   Valtoco 15 MG Dose 7.5 MG/0.1ML Lqpk Generic drug: diazePAM (15 MG Dose) Place 1 Dose into the nose daily as needed.       ASK your doctor about these medications    linezolid 600 MG tablet Commonly known as: ZYVOX Take 1 tablet (600 mg total) by mouth every 12 (twelve) hours for 1 day. Ask about: Should I take this medication?               Discharge Care Instructions  (From admission, onward)           Start     Ordered   05/01/23 0000  Discharge wound care:       Comments: Cleanse buttock and sacral wound with saline, pat dry Apply small amount of hydrogel to wound bed [facility] [place] # [phone]), top with dry dressing.  Change daily   05/01/23 0932            Follow-up Information     [facility]. Schedule an appointment as soon as possible for a visit.   Specialty: Rehabilitation Contact information: [address] [place] [address] [phone]               Discharge  Exam: Filed Weights   04/28/23 0701 04/29/23 0546 05/01/23 0500  Weight: 68 kg 70.6 kg 70.6 kg   General exam: Appears calm and comfortable  Respiratory system: Clear to auscultation. Respiratory effort normal. Cardiovascular system: S1 & S2 heard, RRR. No JVD,  Gastrointestinal system: Abdomen is nondistended, soft and nontender.     Condition at discharge: fair  The results of significant diagnostics from this hospitalization (including imaging, microbiology, ancillary and laboratory) are listed below for reference.   Imaging Studies: DG Abd Portable 1V Result Date: 04/25/2023 CLINICAL DATA:  NG tube placement EXAM: PORTABLE ABDOMEN - 1 VIEW limited for  tube placement COMPARISON:  Abdominal x-ray 02/15/2023 FINDINGS: Gas seen in nondilated loops of small and large bowel with scattered colonic stool. Enteric tube overlying the mid distal stomach in left upper quadrant. Overlapping cardiac leads. IMPRESSION: Limited x-ray for tube placement has the NG tube with tip overlying the expected location of the mid-distal stomach. Electronically Signed   By: Karen Kays M.D.   On: 04/25/2023 19:03   CT PELVIS W CONTRAST Result Date: 04/25/2023 CLINICAL DATA:  Perianal abscess or fistula suspected. EXAM: CT PELVIS WITH CONTRAST TECHNIQUE: Multidetector CT imaging of the pelvis was performed using the standard protocol following the bolus administration of intravenous contrast. RADIATION DOSE REDUCTION: This exam was performed according to the departmental dose-optimization program which includes automated exposure control, adjustment of the mA and/or kV according to patient size and/or use of iterative reconstruction technique. CONTRAST:  75mL OMNIPAQUE IOHEXOL 350 MG/ML SOLN COMPARISON:  None Available. FINDINGS: Evaluation of this exam is limited due to respiratory motion. Urinary Tract:  No abnormality visualized. Bowel:  Unremarkable visualized pelvic bowel loops. Vascular/Lymphatic: No pathologically  enlarged lymph nodes. No significant vascular abnormality seen. Reproductive: The prostate and seminal vesicles are grossly unremarkable. Other: There is induration of the superficial subcutaneous fat over the right buttock along the intergluteal cleft with possible small phlegmon. No drainable fluid collection or abscess. No soft tissue gas. Musculoskeletal: No suspicious bone lesions identified. IMPRESSION: Induration of the superficial subcutaneous fat over the right buttock. No drainable fluid collection or abscess. Electronically Signed   By: Elgie Collard M.D.   On: 04/25/2023 18:33   Overnight EEG with video Result Date: 04/25/2023 Charlsie Quest, MD     04/26/2023  9:37 AM Patient Name: Miguel Black MRN: [id number] Epilepsy Attending: Charlsie Quest Referring Physician/Provider: Milon Dikes, MD Duration: 04/24/2023 2243 to 04/26/2023 0115 Patient history: 33 y.o. male past medical history of MS and seizures with noncompliance to medications coming in with multiple breakthrough seizures without return to baseline- status epilepticus. EEG to evaluate for seizure Level of alertness:  lethargic AEDs during EEG study: Phenobarb Technical aspects: This EEG study was done with scalp electrodes positioned according to the 10-20 International system of electrode placement. Electrical activity was reviewed with band pass filter of 1-70Hz , sensitivity of 7 uV/mm, display speed of 75mm/sec with a 60Hz  notched filter applied as appropriate. EEG data were recorded continuously and digitally stored.  Video monitoring was available and reviewed as appropriate. Description: EEG showed continuous generalized and maximal left fronto-temporal 3 to 6 Hz theta-delta slowing admixed with 13-15Hz  beta activity. Hyperventilation and photic stimulation were not performed.   EEG was disconnected between 04/25/2023 0045 to 0216. ABNORMALITY - Continuous slow, generalized and maximal left fronto-temporal IMPRESSION: This study  is suggestive of cortical dysfunction arising from lleft fronto-temporal region likely secondary to underlying structural abnormality, post-ictal state. Additionally there is moderate diffuse encephalopathy. No seizures or epileptiform discharges were seen throughout the recording. Priyanka [name]   CT HEAD WO CONTRAST ( ) Result Date: 04/24/2023 CLINICAL DATA:  Altered mental status EXAM: CT HEAD WITHOUT CONTRAST TECHNIQUE: Contiguous axial images were obtained from the base of the skull through the vertex without intravenous contrast. RADIATION DOSE REDUCTION: This exam was performed according to the departmental dose-optimization program which includes automated exposure control, adjustment of the mA and/or kV according to patient size and/or use of iterative reconstruction technique. COMPARISON:  02/14/2023 FINDINGS: Brain: Normal anatomic configuration of the brain. Periventricular white matter changes are again identified in  keeping with the known diagnosis of multiple sclerosis, most severe within the left periatrial region. There is hypoattenuation of the inferior left occipital pole which may be artifactual and related to beam hardening, however, an acute to subacute infarct in this region is difficult to exclude. No superimposed acute intracranial hemorrhage. No abnormal mass effect or midline shift. Ventricular size is normal. Cerebellum is unremarkable. Vascular: No hyperdense vessel or unexpected calcification. Skull: Normal. Negative for fracture or focal lesion. Sinuses/Orbits: No acute finding. Other: Mastoid air cells and middle ear cavities are clear. IMPRESSION: 1. Periventricular white matter changes in keeping with the known diagnosis of multiple sclerosis, most severe within the left periatrial region. 2. Hypoattenuation of the inferior left occipital pole which may be artifactual and related to beam hardening, however, an acute to subacute infarct in this region is difficult to exclude.  If indicated, this could be confirmed with MRI examination. Electronically Signed   By: Helyn Numbers M.D.   On: 04/24/2023 22:19   DG Chest Portable 1 View Result Date: 04/24/2023 CLINICAL DATA:  Altered mental status EXAM: PORTABLE CHEST 1 VIEW COMPARISON:  02/15/2023 FINDINGS: Minimal atelectasis or scarring at the left lung base. Lungs are otherwise clear. No pneumothorax or pleural effusion. Cardiac size within normal limits. No acute bone abnormality. IMPRESSION: No active disease. Electronically Signed   By: Helyn Numbers M.D.   On: 04/24/2023 21:32    Microbiology: Results for orders placed or performed during the hospital encounter of 04/24/23  Resp panel by RT-PCR (RSV, Flu A&B, Covid) Anterior Nasal Swab     Status: None   Collection Time: 04/24/23  8:44 PM   Specimen: Anterior Nasal Swab  Result Value Ref Range Status   SARS Coronavirus 2 by RT PCR NEGATIVE NEGATIVE Final   Influenza A by PCR NEGATIVE NEGATIVE Final   Influenza B by PCR NEGATIVE NEGATIVE Final    Comment: (NOTE) The Xpert Xpress SARS-CoV-2/FLU/RSV plus assay is intended as an aid in the diagnosis of influenza from Nasopharyngeal swab specimens and should not be used as a sole basis for treatment. Nasal washings and aspirates are unacceptable for Xpert Xpress SARS-CoV-2/FLU/RSV testing.  Fact Sheet for Patients: BloggerCourse.com  Fact Sheet for Healthcare Providers: SeriousBroker.it  This test is not yet approved or cleared by the [place] FDA and has been authorized for detection and/or diagnosis of SARS-CoV-2 by FDA under an Emergency Use Authorization (EUA). This EUA will remain in effect (meaning this test can be used) for the duration of the COVID-19 declaration under Section 564(b)(1) of the Act, 21 U.S.C. section 360bbb-3(b)(1), unless the authorization is terminated or revoked.     Resp Syncytial Virus by PCR NEGATIVE NEGATIVE Final     Comment: (NOTE) Fact Sheet for Patients: BloggerCourse.com  Fact Sheet for Healthcare Providers: SeriousBroker.it  This test is not yet approved or cleared by the [place] FDA and has been authorized for detection and/or diagnosis of SARS-CoV-2 by FDA under an Emergency Use Authorization (EUA). This EUA will remain in effect (meaning this test can be used) for the duration of the COVID-19 declaration under Section 564(b)(1) of the Act, 21 U.S.C. section 360bbb-3(b)(1), unless the authorization is terminated or revoked.  Performed at [facility], [address], [place], [place] [address]   MRSA Next Gen by PCR, Nasal     Status: None   Collection Time: 04/25/23  1:16 AM   Specimen: Nasal Mucosa; Nasal Swab  Result Value Ref Range Status  MRSA by PCR Next Gen NOT DETECTED NOT DETECTED Final    Comment: (NOTE) The GeneXpert MRSA Assay (FDA approved for NASAL specimens only), is one component of a comprehensive MRSA colonization surveillance program. It is not intended to diagnose MRSA infection nor to guide or monitor treatment for MRSA infections. Test performance is not FDA approved in patients less than 17 years old. Performed at [facility], [address], [place], [place] [address]   Culture, blood (Routine X 2) w Reflex to ID Panel     Status: None   Collection Time: 04/25/23 10:01 AM   Specimen: BLOOD RIGHT HAND  Result Value Ref Range Status   Specimen Description BLOOD RIGHT HAND  Final   Special Requests   Final    BOTTLES DRAWN AEROBIC ONLY Blood Culture results may not be optimal due to an inadequate volume of blood received in culture bottles   Culture   Final    NO GROWTH 5 DAYS Performed at [facility], [address] [place], [place] [address]    Report Status 04/30/2023 FINAL  Final  Culture, blood (Routine X 2) w Reflex to ID Panel     Status: None   Collection Time: 04/25/23  10:01 AM   Specimen: BLOOD LEFT HAND  Result Value Ref Range Status   Specimen Description BLOOD LEFT HAND  Final   Special Requests   Final    BOTTLES DRAWN AEROBIC ONLY Blood Culture results may not be optimal due to an inadequate volume of blood received in culture bottles   Culture   Final    NO GROWTH 5 DAYS Performed at [facility], [address] [place], [place] [address]    Report Status 04/30/2023 FINAL  Final    Labs: CBC: Recent Labs  Lab 04/27/23 0554 04/29/23 0650  WBC 8.9 5.5  HGB 14.7 13.8  HCT 43.4 42.1  MCV 94.8 97.7  PLT 340 341   Basic Metabolic Panel: Recent Labs  Lab 04/26/23 0437 04/26/23 1619 04/27/23 0554 04/28/23 0627 04/29/23 0650  NA 138  --  140 141 143  K 3.5  --  3.8 4.1 4.3  CL 110  --  108 108 110  CO2 17*  --  21* 23 24  GLUCOSE 108*  --  103* 98 94  BUN 9  --  8 10 12   CREATININE 1.26*  --  0.93 0.96 0.91  CALCIUM 8.3*  --  8.5* 8.7* 8.9  MG 2.1 2.0  --   --   --   PHOS 2.3* 2.8  --   --   --    Liver Function Tests: Recent Labs  Lab 04/26/23 0437 04/27/23 0554  AST 12* 14*  ALT 11 12  ALKPHOS 45 41  BILITOT 0.6 0.7  PROT 6.6 6.5  ALBUMIN 3.5 3.5   CBG: Recent Labs  Lab 04/30/23 1113 04/30/23 1735 04/30/23 2117 05/01/23 0737 05/01/23 1216  GLUCAP 100* 123* 121* 103* 101*    Discharge time spent: 38 MINUTES.   Signed: Kathlen Mody, MD [facility]

## 2023-05-03 NOTE — Telephone Encounter (Unsigned)
 Copied from CRM [id number]. Topic: General - Call Back - No Documentation >> May 03, 2023 12:50 PM Miguel Black wrote: Reason for CRM: Patient Mother would like Callback from Nurse regarding pt, state it is an urgent matter.

## 2023-05-10 ENCOUNTER — Telehealth: Payer: Self-pay | Admitting: Neurology

## 2023-05-10 ENCOUNTER — Encounter: Payer: Self-pay | Admitting: Internal Medicine

## 2023-05-10 ENCOUNTER — Ambulatory Visit (INDEPENDENT_AMBULATORY_CARE_PROVIDER_SITE_OTHER): Admitting: Internal Medicine

## 2023-05-10 VITALS — BP 110/62 | HR 90 | Temp 98.0°F | Ht 68.0 in | Wt 161.2 lb

## 2023-05-10 DIAGNOSIS — G40909 Epilepsy, unspecified, not intractable, without status epilepticus: Secondary | ICD-10-CM

## 2023-05-10 DIAGNOSIS — G35 Multiple sclerosis: Secondary | ICD-10-CM

## 2023-05-10 DIAGNOSIS — L0231 Cutaneous abscess of buttock: Secondary | ICD-10-CM

## 2023-05-10 DIAGNOSIS — L8992 Pressure ulcer of unspecified site, stage 2: Secondary | ICD-10-CM

## 2023-05-10 MED ORDER — DOXYCYCLINE HYCLATE 100 MG PO TABS: 100.0000 mg | ORAL_TABLET | Freq: Two times a day (BID) | ORAL | 0 refills | Status: DC

## 2023-05-10 NOTE — Telephone Encounter (Signed)
 Received call from Dr. Boston Byers patient's PCP to schedule an urgent appointment for patient, overdue for Ocrevus infusion. I reviewed last phone note regarding pt transferring care to [facility] and patient still wants to schedule here. Scheduled pt for next week 4/24 at 9:00AM with Dr. Godwin Lat

## 2023-05-10 NOTE — Patient Instructions (Addendum)
 Thursday 24th at 9am arrive by 8:45 am.

## 2023-05-12 NOTE — Assessment & Plan Note (Signed)
 He experiences breakthrough seizures and has difficulty obtaining a neurology referral. Dissatisfied with his previous neurologist, Dr. Larraine Plush, he has nonetheless scheduled an appointment with him next Thursday at 8:45 AM due to limited options. He should attend this appointment.

## 2023-05-12 NOTE — Assessment & Plan Note (Signed)
 His multiple sclerosis is managed with Ocrelizumab  infusions every 6 months. He is due for an infusion that requires neurologist oversight. An appointment with Dr. Godwin Lat is scheduled via direct coordination with their office, after discussion of other options.  Ensure the Ocrelizumab  infusion is scheduled and administered as due.

## 2023-05-12 NOTE — Assessment & Plan Note (Signed)
-  We reviewed the etiology of recurrent abscesses of skin.  Skin abscesses are collections of pus within the dermis and deeper skin tissues. Skin abscesses manifest as painful, tender, fluctuant, and erythematous nodules, frequently surmounted by a pustule and surrounded by a rim of erythematous swelling.  Spontaneous drainage of purulent material may occur.  Fever can occur on occasion.   -Skin abscesses can develop in healthy individuals with no predisposing conditions other than skin or nasal carriage of Staphylococcus aureus.  Individuals in close contact with others who have active infection with skin abscesses are at increased risk which is likely to explain why twin brother has similar episodes.   In addition, any process leading to a breach in the skin barrier can also predispose to the development of a skin abscesses, such as atopic dermatitis.    He has a healing sacral decubitus ulcer with a small pocket of abscess and pus, and multiple hypersensitive abscesses on the right gluteal cleft border. These are partially drained, and wound cultures have been sent. The abscesses are likely follicular due to prolonged sitting and skin breakdown. He experiences significant pain during drainage, with a risk of infection spreading if not managed. He needs improved seating and padding to prevent further skin breakdown. Prescribe doxycycline  for 10 days and schedule a follow-up appointment in 7-10 days to reassess and potentially drain remaining abscesses. He should monitor for signs of spreading infection or fever and seek hospital care if these occur.

## 2023-05-12 NOTE — Progress Notes (Signed)
 ==============================  [facility]: [phone]   -- Medical Office Visit --  Patient: Miguel Black      Age: 33 y.o.       Sex:  male  Date:   05/10/2023 Today's Healthcare Provider: Anthon Kins, MD  ==============================   Chief Complaint: Hospitalization Follow-up (Pt states he is doing better but starts PT. Pt mom states he needs neurology referral never heard from the office we sent him to.)  History of Present Illness 33 year old male with multiple sclerosis who presents with gluteal abscesses and a sacral decubitus ulcer.  He has been experiencing pain around his buttocks, specifically noting a healing sacral decubitus ulcer at the top of the buttocks and multiple hypersensitive abscesses on the right gluteal cleft border. These abscesses have been drained and are actively draining to some degree, but the procedure of manual drainage was too painful for him to complete fully today, and he elected for antibiotics after and initial attempt to drain them  He has a history of multiple sclerosis and has been seeking a neurologist for management. He has had difficulty securing a neurology referral, with six referrals made since 2021, four of which were in the last six months. He felt his intelligence was insulted with his neurologist, Dr. Candi Chafe acknowledges Dr. Thom Fleeting expertise in multiple sclerosis and is willing to return to him as the only multiple sclerosis expert in the city.  He has experienced fevers in the past, which were thought to be related to infections, but it was noted that these did not reach the bone. The infections in his buttocks could be contributing to his fever issues.  He does not recall any allergies to medications and has previously taken antibiotics without issue. No chest pain, shortness of breath, or palpitations.  Background: Reviewed: He has multiple sclerosis; Gait disturbance; Left-sided  weakness; Other fatigue; Seizure disorder (HCC); Marijuana use; High risk medication use; History of optic neuritis; Internuclear ophthalmoplegia of right eye; Neurological deficit present; Hypermetropia of left eye; Foot drop; Vitamin D  deficiency; Smokes cigars; Ambulates with cane; Ataxia; Status epilepticus (HCC); Breakthrough seizure (HCC); Leukocytosis; Hypocalcemia; Elevated AST (SGOT); Seizures (HCC); Seizure (HCC); and Abscess of gluteal cleft on their problem list.  Reviewed: He  has a past medical history of Acute encephalopathy (04/26/2022), Acute metabolic encephalopathy (04/26/2022), Acute respiratory failure with hypoxia (HCC) (08/23/2021), Aspiration pneumonia (HCC) (07/12/2018), Closed fracture of distal phalanx or phalanges of hand (02/05/2009), Encounter for screening for HIV (07/12/2018), Foot drop (06/03/2022), Marijuana abuse (05/01/2013), MS (multiple sclerosis) (HCC), Seizure (HCC), Seizures (HCC) (01/22/2019), and Status epilepticus (HCC) (07/12/2018).  Manually updated: No problems updated.  Reviewed:  Allergies as of 05/10/2023 - Review Complete 05/10/2023  Allergen Reaction Noted   Ampyra  [dalfampridine ] Other (See Comments) 06/24/2021    Medications: Reviewed: Current Outpatient Medications on File Prior to Visit  Medication Sig   cloBAZam  (ONFI ) 10 MG tablet Take 1 tablet (10 mg total) by mouth at bedtime.   divalproex  (DEPAKOTE ) 250 MG DR tablet Take 1 tablet (250 mg total) by mouth every morning AND 2 tablets (500 mg total) at bedtime.   feeding supplement (ENSURE ENLIVE / ENSURE PLUS) LIQD Take 237 mLs by mouth 2 (two) times daily between meals.   levOCARNitine  (CARNITOR ) 1 GM/10ML solution Take 5 mLs (500 mg total) by mouth 2 (two) times daily.   Multiple Vitamin (MULTIVITAMIN WITH MINERALS) TABS tablet Take 1 tablet by mouth daily.   ocrelizumab  (OCREVUS ) 300  MG/10ML injection Inject 20 mLs (600 mg total) into the vein every 6 (six) months.   diazePAM , 15 MG  Dose, (VALTOCO  15 MG DOSE) 2 x 7.5 MG/0.1ML LQPK Place 1 Dose into the nose daily as needed. (Patient not taking: Reported on 05/10/2023)   docusate sodium  (COLACE) 100 MG capsule Take 1 capsule (100 mg total) by mouth 2 (two) times daily as needed for mild constipation. (Patient not taking: Reported on 05/10/2023)   No current facility-administered medications on file prior to visit.  There are no discontinued medications.      Physical Exam:    05/10/2023    1:44 PM 05/01/2023   12:14 PM 05/01/2023    8:11 AM  Vitals with BMI  Height 5\' 8"     Weight 161 lbs 3 oz    BMI 24.52    Systolic 110 120 098  Diastolic 62 87 68  Pulse 90 68 69   Wt Readings from Last 10 Encounters:  05/10/23 161 lb 3.2 oz (73.1 kg)  05/01/23 155 lb 10.3 oz (70.6 kg)  03/09/23 141 lb 12.8 oz (64.3 kg)  02/17/23 138 lb 7.2 oz (62.8 kg)  11/24/22 163 lb 6.4 oz (74.1 kg)  11/12/22 136 lb 11 oz (62 kg)  10/06/22 143 lb 4.8 oz (65 kg)  08/08/22 142 lb (64.4 kg)  06/16/22 154 lb 5.2 oz (70 kg)  06/03/22 149 lb 12.8 oz (67.9 kg)  Vital signs reviewed.  Nursing notes reviewed. Weight trend reviewed. Physical Exam Skin:           Physical Exam RECTAL: Healing sacral decubitus ulcer at the top of the buttocks with a small abscess and pus. Hypersensitive multiple abscesses on the right gluteal cleft border.   General Appearance:  No acute distress appreciable.   Well-groomed, healthy-appearing male.  Well proportioned with no abnormal fat distribution.  Good muscle tone. Pulmonary:  Normal work of breathing at rest, no respiratory distress apparent. SpO2: 98 %  Musculoskeletal: All extremities are intact.  Neurological:  Awake, alert, oriented, and engaged.  No obvious focal neurological deficits or cognitive impairments.  Sensorium seems unclouded.   Speech is clear and coherent with logical content. Psychiatric:  Appropriate mood, pleasant and cooperative demeanor, thoughtful and engaged during the  exam    Results for orders placed or performed in visit on 05/10/23  [id number]  Result Value Ref Range   QUESTION/PROBLEM:     SPECIMEN(S) RECEIVED: Specimen 2AO unneeded    Office Visit on 05/10/2023  Component Date Value   QUESTION/PROBLEM: 05/10/2023     SPECIMEN(S) RECEIVED: 05/10/2023 Specimen 2AO unneeded   No results displayed because visit has over 200 results.    No results displayed because visit has over 200 results.    Admission on 11/12/2022, Discharged on 11/15/2022  Component Date Value   WBC 11/12/2022 13.7 (H)    RBC 11/12/2022 4.89    Hemoglobin 11/12/2022 15.3    HCT 11/12/2022 47.6    MCV 11/12/2022 97.3    MCH 11/12/2022 31.3    MCHC 11/12/2022 32.1    RDW 11/12/2022 13.2    Platelets 11/12/2022 256    nRBC 11/12/2022 0.0    Neutrophils Relative % 11/12/2022 84    Neutro Abs 11/12/2022 11.4 (H)    Lymphocytes Relative 11/12/2022 12    Lymphs Abs 11/12/2022 1.7    Monocytes Relative 11/12/2022 3    Monocytes Absolute 11/12/2022 0.4    Eosinophils Relative 11/12/2022 0    Eosinophils Absolute 11/12/2022  0.0    Basophils Relative 11/12/2022 0    Basophils Absolute 11/12/2022 0.1    Immature Granulocytes 11/12/2022 1    Abs Immature Granulocytes 11/12/2022 0.13 (H)    Sodium 11/12/2022 140    Potassium 11/12/2022 5.4 (H)    Chloride 11/12/2022 105    CO2 11/12/2022 23    Glucose, Bld 11/12/2022 123 (H)    BUN 11/12/2022 10    Creatinine, Ser 11/12/2022 1.11    Calcium  11/12/2022 9.0    Total Protein 11/12/2022 7.4    Albumin 11/12/2022 4.0    AST 11/12/2022 24    ALT 11/12/2022 20    Alkaline Phosphatase 11/12/2022 64    Total Bilirubin 11/12/2022 0.3    GFR, Estimated 11/12/2022 >60    Anion gap 11/12/2022 12    Alcohol, Ethyl (B) 11/12/2022 <10    Valproic Acid  Lvl 11/12/2022 57    Ammonia 11/13/2022 46 (H)    Sodium 11/13/2022 136    Potassium 11/13/2022 3.5    Chloride 11/13/2022 103    CO2 11/13/2022 23    Glucose, Bld 11/13/2022 95     BUN 11/13/2022 9    Creatinine, Ser 11/13/2022 0.83    Calcium  11/13/2022 7.8 (L)    GFR, Estimated 11/13/2022 >60    Anion gap 11/13/2022 10    Lactic Acid, Venous 11/13/2022 1.6    Lactic Acid, Venous 11/13/2022 1.0    WBC 11/13/2022 16.7 (H)    RBC 11/13/2022 4.22    Hemoglobin 11/13/2022 13.4    HCT 11/13/2022 40.3    MCV 11/13/2022 95.5    MCH 11/13/2022 31.8    MCHC 11/13/2022 33.3    RDW 11/13/2022 13.2    Platelets 11/13/2022 223    nRBC 11/13/2022 0.0    Specimen Description 11/14/2022 URINE, CLEAN CATCH    Special Requests 11/14/2022 NONE    Culture 11/14/2022                     Value:NO GROWTH Performed at [facility], [address] [place], [place] [address]    Report Status 11/14/2022 11/15/2022 FINAL    Specimen Description 11/13/2022 BLOOD SITE NOT SPECIFIED    Special Requests 11/13/2022 BOTTLES DRAWN AEROBIC AND ANAEROBIC Blood Culture adequate volume    Culture 11/13/2022                     Value:NO GROWTH 5 DAYS Performed at [facility], [address] [place], [place] [address]    Report Status 11/13/2022 11/18/2022 FINAL    Specimen Description 11/13/2022 BLOOD SITE NOT SPECIFIED    Special Requests 11/13/2022 BOTTLES DRAWN AEROBIC AND ANAEROBIC Blood Culture adequate volume    Culture  Setup Time 11/13/2022                     Value:GRAM POSITIVE COCCI IN CLUSTERS ANAEROBIC BOTTLE ONLY CRITICAL RESULT CALLED TO, READ BACK BY AND VERIFIED WITH: PHARMD M. BITONTI 11/13/22 @ 2233 BY AB    Culture 11/13/2022  (A)                    Value:STAPHYLOCOCCUS HOMINIS THE SIGNIFICANCE OF ISOLATING THIS ORGANISM FROM A SINGLE SET OF BLOOD CULTURES WHEN MULTIPLE SETS ARE DRAWN IS UNCERTAIN. PLEASE NOTIFY THE MICROBIOLOGY DEPARTMENT WITHIN ONE WEEK IF SPECIATION AND SENSITIVITIES ARE REQUIRED. Performed at [facility], [address], [place], [place] [address]    Report Status  11/13/2022 11/15/2022 FINAL    Magnesium  11/13/2022 1.9     Procalcitonin 11/13/2022 <0.10    Phosphorus 11/13/2022 4.7 (H)    Opiates 11/13/2022 NONE DETECTED    Cocaine 11/13/2022 NONE DETECTED    Benzodiazepines 11/13/2022 POSITIVE (A)    Amphetamines 11/13/2022 NONE DETECTED    Tetrahydrocannabinol 11/13/2022 POSITIVE (A)    Barbiturates 11/13/2022 NONE DETECTED    Color, Urine 11/13/2022 YELLOW    APPearance 11/13/2022 HAZY (A)    Specific Gravity, Urine 11/13/2022 1.033 (H)    pH 11/13/2022 7.0    Glucose, UA 11/13/2022 NEGATIVE    Hgb urine dipstick 11/13/2022 NEGATIVE    Bilirubin Urine 11/13/2022 NEGATIVE    Ketones, ur 11/13/2022 5 (A)    Protein, ur 11/13/2022 NEGATIVE    Nitrite 11/13/2022 NEGATIVE    Leukocytes,Ua 11/13/2022 NEGATIVE    Phosphorus 11/14/2022 3.5    Magnesium  11/14/2022 2.0    WBC 11/14/2022 7.3    RBC 11/14/2022 4.35    Hemoglobin 11/14/2022 13.7    HCT 11/14/2022 42.0    MCV 11/14/2022 96.6    MCH 11/14/2022 31.5    MCHC 11/14/2022 32.6    RDW 11/14/2022 13.2    Platelets 11/14/2022 197    nRBC 11/14/2022 0.0    Sodium 11/14/2022 141    Potassium 11/14/2022 4.1    Chloride 11/14/2022 110    CO2 11/14/2022 21 (L)    Glucose, Bld 11/14/2022 94    BUN 11/14/2022 14    Creatinine, Ser 11/14/2022 0.95    Calcium  11/14/2022 8.2 (L)    GFR, Estimated 11/14/2022 >60    Anion gap 11/14/2022 10    Enterococcus faecalis 11/13/2022 NOT DETECTED    Enterococcus Faecium 11/13/2022 NOT DETECTED    Listeria monocytogenes 11/13/2022 NOT DETECTED    Staphylococcus species 11/13/2022 DETECTED (A)    Staphylococcus aureus (B* 11/13/2022 NOT DETECTED    Staphylococcus epidermid* 11/13/2022 NOT DETECTED    Staphylococcus lugdunens* 11/13/2022 NOT DETECTED    Streptococcus species 11/13/2022 NOT DETECTED    Streptococcus agalactiae 11/13/2022 NOT DETECTED    Streptococcus pneumoniae 11/13/2022 NOT DETECTED    Streptococcus pyogenes 11/13/2022 NOT DETECTED    A.calcoaceticus-baumannii 11/13/2022 NOT DETECTED     Bacteroides fragilis 11/13/2022 NOT DETECTED    Enterobacterales 11/13/2022 NOT DETECTED    Enterobacter cloacae com* 11/13/2022 NOT DETECTED    Escherichia coli 11/13/2022 NOT DETECTED    Klebsiella aerogenes 11/13/2022 NOT DETECTED    Klebsiella oxytoca 11/13/2022 NOT DETECTED    Klebsiella pneumoniae 11/13/2022 NOT DETECTED    Proteus species 11/13/2022 NOT DETECTED    Salmonella species 11/13/2022 NOT DETECTED    Serratia marcescens 11/13/2022 NOT DETECTED    Haemophilus influenzae 11/13/2022 NOT DETECTED    Neisseria meningitidis 11/13/2022 NOT DETECTED    Pseudomonas aeruginosa 11/13/2022 NOT DETECTED    Stenotrophomonas maltoph* 11/13/2022 NOT DETECTED    Candida albicans 11/13/2022 NOT DETECTED    Candida auris 11/13/2022 NOT DETECTED    Candida glabrata 11/13/2022 NOT DETECTED    Candida krusei 11/13/2022 NOT DETECTED    Candida parapsilosis 11/13/2022 NOT DETECTED    Candida tropicalis 11/13/2022 NOT DETECTED    Cryptococcus neoformans/* 11/13/2022 NOT DETECTED   Admission on 10/06/2022, Discharged on 10/09/2022  Component Date Value   WBC 10/06/2022 16.5 (H)    RBC 10/06/2022 4.84    Hemoglobin 10/06/2022 15.2    HCT 10/06/2022 47.1    MCV 10/06/2022 97.3    MCH 10/06/2022 31.4    MCHC 10/06/2022 32.3  RDW 10/06/2022 13.5    Platelets 10/06/2022 265    nRBC 10/06/2022 0.0    Neutrophils Relative % 10/06/2022 85    Neutro Abs 10/06/2022 14.1 (H)    Lymphocytes Relative 10/06/2022 8    Lymphs Abs 10/06/2022 1.3    Monocytes Relative 10/06/2022 5    Monocytes Absolute 10/06/2022 0.8    Eosinophils Relative 10/06/2022 0    Eosinophils Absolute 10/06/2022 0.0    Basophils Relative 10/06/2022 0    Basophils Absolute 10/06/2022 0.1    Immature Granulocytes 10/06/2022 2    Abs Immature Granulocytes 10/06/2022 0.28 (H)    Sodium 10/06/2022 135    Potassium 10/06/2022 5.3 (H)    Chloride 10/06/2022 104    CO2 10/06/2022 18 (L)    Glucose, Bld 10/06/2022 110 (H)     BUN 10/06/2022 12    Creatinine, Ser 10/06/2022 1.08    Calcium  10/06/2022 8.3 (L)    Total Protein 10/06/2022 6.7    Albumin 10/06/2022 4.1    AST 10/06/2022 42 (H)    ALT 10/06/2022 24    Alkaline Phosphatase 10/06/2022 57    Total Bilirubin 10/06/2022 1.0    GFR, Estimated 10/06/2022 >60    Anion gap 10/06/2022 13    Glucose-Capillary 10/06/2022 118 (H)    Color, Urine 10/07/2022 YELLOW    APPearance 10/07/2022 CLEAR    Specific Gravity, Urine 10/07/2022 1.020    pH 10/07/2022 6.0    Glucose, UA 10/07/2022 NEGATIVE    Hgb urine dipstick 10/07/2022 NEGATIVE    Bilirubin Urine 10/07/2022 NEGATIVE    Ketones, ur 10/07/2022 NEGATIVE    Protein, ur 10/07/2022 NEGATIVE    Nitrite 10/07/2022 NEGATIVE    Leukocytes,Ua 10/07/2022 NEGATIVE    Total CK 10/07/2022 510 (H)    WBC 10/08/2022 6.8    RBC 10/08/2022 4.06 (L)    Hemoglobin 10/08/2022 13.2    HCT 10/08/2022 39.7    MCV 10/08/2022 97.8    MCH 10/08/2022 32.5    MCHC 10/08/2022 33.2    RDW 10/08/2022 13.6    Platelets 10/08/2022 223    nRBC 10/08/2022 0.0    Sodium 10/08/2022 143    Potassium 10/08/2022 3.9    Chloride 10/08/2022 107    CO2 10/08/2022 23    Glucose, Bld 10/08/2022 76    BUN 10/08/2022 10    Creatinine, Ser 10/08/2022 1.27 (H)    Calcium  10/08/2022 8.3 (L)    Total Protein 10/08/2022 5.8 (L)    Albumin 10/08/2022 3.3 (L)    AST 10/08/2022 21    ALT 10/08/2022 19    Alkaline Phosphatase 10/08/2022 45    Total Bilirubin 10/08/2022 0.8    GFR, Estimated 10/08/2022 >60    Anion gap 10/08/2022 13    Valproic Acid  Lvl 10/08/2022 80    Sodium 10/09/2022 139    Potassium 10/09/2022 3.6    Chloride 10/09/2022 109    CO2 10/09/2022 23    Glucose, Bld 10/09/2022 90    BUN 10/09/2022 9    Creatinine, Ser 10/09/2022 1.03    Calcium  10/09/2022 8.2 (L)    GFR, Estimated 10/09/2022 >60    Anion gap 10/09/2022 7   Office Visit on 08/08/2022  Component Date Value   WBC 08/08/2022 5.5    RBC 08/08/2022 4.58     Hemoglobin 08/08/2022 14.6    Hematocrit 08/08/2022 43.0    MCV 08/08/2022 94    MCH 08/08/2022 31.9    MCHC 08/08/2022 34.0    RDW 08/08/2022  12.9    Platelets 08/08/2022 300    Neutrophils 08/08/2022 63    Lymphs 08/08/2022 27    Monocytes 08/08/2022 8    Eos 08/08/2022 1    Basos 08/08/2022 1    Neutrophils Absolute 08/08/2022 3.5    Lymphocytes Absolute 08/08/2022 1.5    Monocytes Absolute 08/08/2022 0.4    EOS (ABSOLUTE) 08/08/2022 0.1    Basophils Absolute 08/08/2022 0.0    Immature Granulocytes 08/08/2022 0    Immature Grans (Abs) 08/08/2022 0.0    IgG (Immunoglobin G), Se* 08/08/2022 1,112    IgA/Immunoglobulin A, Se* 08/08/2022 133    IgM (Immunoglobulin M), * 08/08/2022 20    Vit D, 25-Hydroxy 08/08/2022 40.9   Admission on 06/13/2022, Discharged on 06/16/2022  Component Date Value   Glucose-Capillary 06/13/2022 108 (H)    WBC 06/13/2022 14.8 (H)    RBC 06/13/2022 4.33    Hemoglobin 06/13/2022 13.7    HCT 06/13/2022 43.3    MCV 06/13/2022 100.0    MCH 06/13/2022 31.6    MCHC 06/13/2022 31.6    RDW 06/13/2022 13.8    Platelets 06/13/2022 268    nRBC 06/13/2022 0.0    Neutrophils Relative % 06/13/2022 93    Neutro Abs 06/13/2022 13.9 (H)    Lymphocytes Relative 06/13/2022 3    Lymphs Abs 06/13/2022 0.5 (L)    Monocytes Relative 06/13/2022 3    Monocytes Absolute 06/13/2022 0.4    Eosinophils Relative 06/13/2022 0    Eosinophils Absolute 06/13/2022 0.0    Basophils Relative 06/13/2022 0    Basophils Absolute 06/13/2022 0.0    Immature Granulocytes 06/13/2022 1    Abs Immature Granulocytes 06/13/2022 0.07    Sodium 06/13/2022 138    Potassium 06/13/2022 4.1    Chloride 06/13/2022 104    CO2 06/13/2022 21 (L)    Glucose, Bld 06/13/2022 98    BUN 06/13/2022 7    Creatinine, Ser 06/13/2022 1.01    Calcium  06/13/2022 8.7 (L)    Total Protein 06/13/2022 7.1    Albumin 06/13/2022 4.2    AST 06/13/2022 43 (H)    ALT 06/13/2022 20    Alkaline Phosphatase  06/13/2022 65    Total Bilirubin 06/13/2022 0.7    GFR, Estimated 06/13/2022 >60    Anion gap 06/13/2022 13    Lactic Acid, Venous 06/13/2022 2.7 (HH)    Lactic Acid, Venous 06/13/2022 2.3 (HH)    Opiates 06/13/2022 NONE DETECTED    Cocaine 06/13/2022 NONE DETECTED    Benzodiazepines 06/13/2022 POSITIVE (A)    Amphetamines 06/13/2022 NONE DETECTED    Tetrahydrocannabinol 06/13/2022 POSITIVE (A)    Barbiturates 06/13/2022 NONE DETECTED    Magnesium  06/13/2022 1.8    Levetiracetam  Lvl 06/13/2022 <2.0 (L)    pH, Arterial 06/13/2022 7.263 (L)    pCO2 arterial 06/13/2022 51.3 (H)    pO2, Arterial 06/13/2022 510 (H)    Bicarbonate 06/13/2022 23.1    TCO2 06/13/2022 25    O2 Saturation 06/13/2022 100    Acid-base deficit 06/13/2022 4.0 (H)    Sodium 06/13/2022 139    Potassium 06/13/2022 3.7    Calcium , Ion 06/13/2022 1.12 (L)    HCT 06/13/2022 37.0 (L)    Hemoglobin 06/13/2022 12.6 (L)    Patient temperature 06/13/2022 99.5 F    Collection site 06/13/2022 RADIAL, ALLEN'S TEST ACCEPTABLE    Drawn by 06/13/2022 RT    Sample type 06/13/2022 ARTERIAL    Triglycerides 06/14/2022 153 (H)    MRSA by PCR Next  Gen 06/13/2022 NOT DETECTED    Glucose-Capillary 06/13/2022 93    WBC 06/14/2022 7.8    RBC 06/14/2022 3.97 (L)    Hemoglobin 06/14/2022 12.4 (L)    HCT 06/14/2022 37.5 (L)    MCV 06/14/2022 94.5    MCH 06/14/2022 31.2    MCHC 06/14/2022 33.1    RDW 06/14/2022 13.8    Platelets 06/14/2022 240    nRBC 06/14/2022 0.0    Sodium 06/14/2022 141    Potassium 06/14/2022 3.6    Chloride 06/14/2022 107    CO2 06/14/2022 24    Glucose, Bld 06/14/2022 97    BUN 06/14/2022 6    Creatinine, Ser 06/14/2022 0.95    Calcium  06/14/2022 8.4 (L)    GFR, Estimated 06/14/2022 >60    Anion gap 06/14/2022 10    Magnesium  06/14/2022 2.0    Glucose-Capillary 06/13/2022 92    Glucose-Capillary 06/14/2022 95    Glucose-Capillary 06/14/2022 113 (H)    Glucose-Capillary 06/14/2022 95    WBC  06/15/2022 8.5    RBC 06/15/2022 4.00 (L)    Hemoglobin 06/15/2022 12.7 (L)    HCT 06/15/2022 38.1 (L)    MCV 06/15/2022 95.3    MCH 06/15/2022 31.8    MCHC 06/15/2022 33.3    RDW 06/15/2022 13.6    Platelets 06/15/2022 212    nRBC 06/15/2022 0.0    Sodium 06/15/2022 141    Potassium 06/15/2022 3.1 (L)    Chloride 06/15/2022 106    CO2 06/15/2022 24    Glucose, Bld 06/15/2022 100 (H)    BUN 06/15/2022 9    Creatinine, Ser 06/15/2022 0.99    Calcium  06/15/2022 8.4 (L)    GFR, Estimated 06/15/2022 >60    Anion gap 06/15/2022 11    Glucose-Capillary 06/14/2022 122 (H)    Glucose-Capillary 06/15/2022 105 (H)    Sodium 06/16/2022 142    Potassium 06/16/2022 3.9    Chloride 06/16/2022 109    CO2 06/16/2022 22    Glucose, Bld 06/16/2022 100 (H)    BUN 06/16/2022 9    Creatinine, Ser 06/16/2022 1.06    Calcium  06/16/2022 8.7 (L)    GFR, Estimated 06/16/2022 >60    Anion gap 06/16/2022 11    Magnesium  06/16/2022 2.0   Office Visit on 06/03/2022  Component Date Value   WBC 06/03/2022 7.7    RBC 06/03/2022 4.63    Hemoglobin 06/03/2022 14.6    HCT 06/03/2022 42.7    MCV 06/03/2022 92.2    MCH 06/03/2022 31.5    MCHC 06/03/2022 34.2    RDW 06/03/2022 13.1    Platelets 06/03/2022 278    MPV 06/03/2022 10.6    Neutro Abs 06/03/2022 5,198    Lymphs Abs 06/03/2022 1,786    Absolute Monocytes 06/03/2022 624    Eosinophils Absolute 06/03/2022 54    Basophils Absolute 06/03/2022 39    Neutrophils Relative % 06/03/2022 67.5    Total Lymphocyte 06/03/2022 23.2    Monocytes Relative 06/03/2022 8.1    Eosinophils Relative 06/03/2022 0.7    Basophils Relative 06/03/2022 0.5    Glucose, Bld 06/03/2022 92    BUN 06/03/2022 14    Creat 06/03/2022 1.01    BUN/Creatinine Ratio 06/03/2022 SEE NOTE:    Sodium 06/03/2022 136    Potassium 06/03/2022 4.0    Chloride 06/03/2022 101    CO2 06/03/2022 24    Calcium  06/03/2022 9.7    Total Protein 06/03/2022 7.3    Albumin 06/03/2022 4.6  Globulin 06/03/2022 2.7    AG Ratio 06/03/2022 1.7    Total Bilirubin 06/03/2022 0.5    Alkaline phosphatase (AP* 06/03/2022 74    AST 06/03/2022 13    ALT 06/03/2022 15    Cholesterol 06/03/2022 199    HDL 06/03/2022 69    Triglycerides 06/03/2022 77    LDL Cholesterol (Calc) 06/03/2022 113 (H)    Total CHOL/HDL Ratio 06/03/2022 2.9    Non-HDL Cholesterol (Cal* 06/03/2022 130 (H)    Vit D, 25-Hydroxy 06/03/2022 33    Keppra  (Levetiracetam ) 06/03/2022 <2.0 (L)   No image results found. DG Abd Portable 1V Result Date: 04/25/2023 CLINICAL DATA:  NG tube placement EXAM: PORTABLE ABDOMEN - 1 VIEW limited for tube placement COMPARISON:  Abdominal x-ray 02/15/2023 FINDINGS: Gas seen in nondilated loops of small and large bowel with scattered colonic stool. Enteric tube overlying the mid distal stomach in left upper quadrant. Overlapping cardiac leads. IMPRESSION: Limited x-ray for tube placement has the NG tube with tip overlying the expected location of the mid-distal stomach. Electronically Signed   By: Adrianna Horde M.D.   On: 04/25/2023 19:03   CT PELVIS W CONTRAST Result Date: 04/25/2023 CLINICAL DATA:  Perianal abscess or fistula suspected. EXAM: CT PELVIS WITH CONTRAST TECHNIQUE: Multidetector CT imaging of the pelvis was performed using the standard protocol following the bolus administration of intravenous contrast. RADIATION DOSE REDUCTION: This exam was performed according to the departmental dose-optimization program which includes automated exposure control, adjustment of the mA and/or kV according to patient size and/or use of iterative reconstruction technique. CONTRAST:  75mL OMNIPAQUE  IOHEXOL  350 MG/ML SOLN COMPARISON:  None Available. FINDINGS: Evaluation of this exam is limited due to respiratory motion. Urinary Tract:  No abnormality visualized. Bowel:  Unremarkable visualized pelvic bowel loops. Vascular/Lymphatic: No pathologically enlarged lymph nodes. No significant vascular  abnormality seen. Reproductive: The prostate and seminal vesicles are grossly unremarkable. Other: There is induration of the superficial subcutaneous fat over the right buttock along the intergluteal cleft with possible small phlegmon. No drainable fluid collection or abscess. No soft tissue gas. Musculoskeletal: No suspicious bone lesions identified. IMPRESSION: Induration of the superficial subcutaneous fat over the right buttock. No drainable fluid collection or abscess. Electronically Signed   By: Angus Bark M.D.   On: 04/25/2023 18:33   Overnight EEG with video Result Date: 04/25/2023 Arleene Lack, MD     04/26/2023  9:37 AM Patient Name: JASTEN GUYETTE MRN: [id number] Epilepsy Attending: Arleene Lack Referring Physician/Provider: Tona Francis, MD Duration: 04/24/2023 2243 to 04/26/2023 0115 Patient history: 33 y.o. male past medical history of MS and seizures with noncompliance to medications coming in with multiple breakthrough seizures without return to baseline- status epilepticus. EEG to evaluate for seizure Level of alertness:  lethargic AEDs during EEG study: Phenobarb Technical aspects: This EEG study was done with scalp electrodes positioned according to the 10-20 International system of electrode placement. Electrical activity was reviewed with band pass filter of 1-70Hz , sensitivity of 7 uV/mm, display speed of 52mm/sec with a 60Hz  notched filter applied as appropriate. EEG data were recorded continuously and digitally stored.  Video monitoring was available and reviewed as appropriate. Description: EEG showed continuous generalized and maximal left fronto-temporal 3 to 6 Hz theta-delta slowing admixed with 13-15Hz  beta activity. Hyperventilation and photic stimulation were not performed.   EEG was disconnected between 04/25/2023 0045 to 0216. ABNORMALITY - Continuous slow, generalized and maximal left fronto-temporal IMPRESSION: This study is suggestive of cortical dysfunction arising  from lleft fronto-temporal region likely secondary to underlying structural abnormality, post-ictal state. Additionally there is moderate diffuse encephalopathy. No seizures or epileptiform discharges were seen throughout the recording. Priyanka Suzanne Erps   CT HEAD WO CONTRAST ( ) Result Date: 04/24/2023 CLINICAL DATA:  Altered mental status EXAM: CT HEAD WITHOUT CONTRAST TECHNIQUE: Contiguous axial images were obtained from the base of the skull through the vertex without intravenous contrast. RADIATION DOSE REDUCTION: This exam was performed according to the departmental dose-optimization program which includes automated exposure control, adjustment of the mA and/or kV according to patient size and/or use of iterative reconstruction technique. COMPARISON:  02/14/2023 FINDINGS: Brain: Normal anatomic configuration of the brain. Periventricular white matter changes are again identified in keeping with the known diagnosis of multiple sclerosis, most severe within the left periatrial region. There is hypoattenuation of the inferior left occipital pole which may be artifactual and related to beam hardening, however, an acute to subacute infarct in this region is difficult to exclude. No superimposed acute intracranial hemorrhage. No abnormal mass effect or midline shift. Ventricular size is normal. Cerebellum is unremarkable. Vascular: No hyperdense vessel or unexpected calcification. Skull: Normal. Negative for fracture or focal lesion. Sinuses/Orbits: No acute finding. Other: Mastoid air cells and middle ear cavities are clear. IMPRESSION: 1. Periventricular white matter changes in keeping with the known diagnosis of multiple sclerosis, most severe within the left periatrial region. 2. Hypoattenuation of the inferior left occipital pole which may be artifactual and related to beam hardening, however, an acute to subacute infarct in this region is difficult to exclude. If indicated, this could be confirmed with MRI  examination. Electronically Signed   By: Worthy Heads M.D.   On: 04/24/2023 22:19   DG Chest Portable 1 View Result Date: 04/24/2023 CLINICAL DATA:  Altered mental status EXAM: PORTABLE CHEST 1 VIEW COMPARISON:  02/15/2023 FINDINGS: Minimal atelectasis or scarring at the left lung base. Lungs are otherwise clear. No pneumothorax or pleural effusion. Cardiac size within normal limits. No acute bone abnormality. IMPRESSION: No active disease. Electronically Signed   By: Worthy Heads M.D.   On: 04/24/2023 21:32   DG CHEST PORT 1 VIEW Result Date: 02/15/2023 CLINICAL DATA:  Acute respiratory failure. EXAM: PORTABLE CHEST 1 VIEW COMPARISON:  02/13/2023 FINDINGS: Endotracheal tube tip is 4.9 cm above the base of the carina. NG tube is been removed in the interval. Marked interval improvement in aeration at the left base with some minimal residual retrocardiac airspace disease on the current study. Right lung clear. The cardiopericardial silhouette is within normal limits for size. Telemetry leads overlie the chest. IMPRESSION: 1. Endotracheal tube tip is 4.9 cm above the base of the carina. 2. Marked improvement in retrocardiac opacity seen previously. There is some minimal residual airspace disease in the retrocardiac left base on the current study. Electronically Signed   By: Donnal Fusi M.D.   On: 02/15/2023 11:12   DG Abd 1 View Result Date: 02/15/2023 CLINICAL DATA:  Vomiting.  Ileus. EXAM: ABDOMEN - 1 VIEW COMPARISON:  02/13/2023 FINDINGS: Moderate gaseous distention of the stomach. Air is seen scattered through mildly distended small bowel without an overtly obstructive pattern. Gas and stool is noted in the left colon and rectum. Lucency over the low pelvis is presumably in the rectum but has a configuration that resembles the orientation of the urinary bladder. IMPRESSION: 1. Moderate gaseous distention of the stomach with mildly distended small bowel and gas and stool visible in the left colon. No  findings to  suggest bowel obstruction. 2. Lucency over the low pelvis is presumably in the rectum but has a configuration that resembles the orientation of the urinary bladder. Correlation with urinalysis or a history of recent instrumentation suggested. Further workup with pelvis x-rays including a decubitus view may prove helpful. Electronically Signed   By: Donnal Fusi M.D.   On: 02/15/2023 07:11   MR SACRUM SI JOINTS W WO CONTRAST Result Date: 02/14/2023 CLINICAL DATA:  Sacral decubitus ulcer.  Evaluate for osteomyelitis. EXAM: MRI SACRUM WITHOUT AND WITH CONTRAST TECHNIQUE: Multiplanar and multiecho pulse sequences of the sacrum were obtained without and with intravenous contrast. CONTRAST:  6mL GADAVIST  GADOBUTROL  1 MMOL/ML IV SOLN COMPARISON:  None Available. FINDINGS: Bones/Joint/Cartilage No marrow signal abnormality. No fracture or dislocation. Joint spaces are preserved. No joint effusion. Ligaments Collateral ligaments are intact. Muscles and Tendons Intact.  No muscle edema or atrophy. Soft tissue Small superficial midline decubitus ulcer over the mid sacrum. No fluid collection. No soft tissue mass. Foley catheter and dependent air within the bladder. Trace free fluid in the pelvis. IMPRESSION: 1. Small superficial midline decubitus ulcer over the mid sacrum. No abscess or osteomyelitis. Electronically Signed   By: Aleta Anda M.D.   On: 02/14/2023 18:03   Overnight EEG with video Result Date: 02/14/2023 Arleene Lack, MD     02/15/2023  9:25 AM Patient Name: HERON PITCOCK MRN: [id number] Epilepsy Attending: Arleene Lack Referring Physician/Provider: Ronnette Coke, MD Duration: 02/13/2023 1718 to 02/14/2023 1930 Patient history: 33 year old male with history of epilepsy presented with breakthrough seizures.  EEG to evaluate for seizure. Level of alertness:  comatose AEDs during EEG study: Depakote , propofol  Technical aspects: This EEG study was done with scalp electrodes  positioned according to the 10-20 International system of electrode placement. Electrical activity was reviewed with band pass filter of 1-70Hz , sensitivity of 7 uV/mm, display speed of 55mm/sec with a 60Hz  notched filter applied as appropriate. EEG data were recorded continuously and digitally stored.  Video monitoring was available and reviewed as appropriate. Description: EEG showed continuous generalized 3 to 6 Hz theta-delta slowing with overriding 15 to 18 Hz beta activity distributed symmetrically and diffusely. Hyperventilation and photic stimulation were not performed.   EEG was disconnected on 02/14/2023 between 1522 to 1835 for imaging ABNORMALITY - Continuous slow, generalized IMPRESSION: This study is suggestive of severe diffuse encephalopathy likely related to sedation. No seizures or epileptiform discharges were seen throughout the recording. Priyanka Suzanne Erps   CT HEAD WO CONTRAST ( ) Result Date: 02/14/2023 CLINICAL DATA:  33 year old male with seizure, multiple sclerosis. Anisocoria. EXAM: CT HEAD WITHOUT CONTRAST TECHNIQUE: Contiguous axial images were obtained from the base of the skull through the vertex without intravenous contrast. RADIATION DOSE REDUCTION: This exam was performed according to the departmental dose-optimization program which includes automated exposure control, adjustment of the mA and/or kV according to patient size and/or use of iterative reconstruction technique. COMPARISON:  Brain MRI 11/14/2022.  Head CT yesterday. FINDINGS: Brain: Chronic cerebral white matter disease better demonstrated by MRI and most apparent by CT in the left periatrial region. Stable gray-white matter differentiation throughout the brain. No midline shift, ventriculomegaly, mass effect, evidence of mass lesion, intracranial hemorrhage or evidence of cortically based acute infarction. Vascular: Symmetric. No suspicious intracranial vascular hyperdensity. Skull: Stable and intact. Sinuses/Orbits:  Visualized paranasal sinuses and mastoids are stable and well aerated. Other: Patient remains intubated on the scout view with associated fluid level in the pharynx. Disconjugate gaze but otherwise  negative CT appearance of the orbits. Scalp EEG electrodes in place. IMPRESSION: 1. No acute intracranial abnormality by CT. Chronic multiple sclerosis. 2. Intubated.  Scalp EEG electrodes in place. Electronically Signed   By: Marlise Simpers M.D.   On: 02/14/2023 05:05   CT Head Wo Contrast Result Date: 02/13/2023 CLINICAL DATA:  Seizure disorder EXAM: CT HEAD WITHOUT CONTRAST TECHNIQUE: Contiguous axial images were obtained from the base of the skull through the vertex without intravenous contrast. RADIATION DOSE REDUCTION: This exam was performed according to the departmental dose-optimization program which includes automated exposure control, adjustment of the mA and/or kV according to patient size and/or use of iterative reconstruction technique. COMPARISON:  MRI 11/14/2022, CT brain 11/12/2022 FINDINGS: Brain: No acute territorial infarction, hemorrhage or intracranial mass. White matter hypodensities grossly similar, presumably corresponding to history of MS. Stable ventricle size Vascular: No hyperdense vessels.  No unexpected calcification Skull: Normal. Negative for fracture or focal lesion. Sinuses/Orbits: Mucosal thickening in the sinuses. Other: None IMPRESSION: 1. No CT evidence for acute intracranial abnormality. 2. White matter hypodensities grossly similar, presumably corresponding to history of MS. Electronically Signed   By: Esmeralda Hedge M.D.   On: 02/13/2023 16:36   DG Chest Portable 1 View Result Date: 02/13/2023 CLINICAL DATA:  ET tube and enteric tube placement EXAM: PORTABLE CHEST 1 VIEW and limited abdominal x-ray for tube placement COMPARISON:  Chest x-ray 11/12/2022 and older. FINDINGS: ET tube in place with tip seen proximally 5 cm above the carina. Enteric tube with the tip overlying the  midbody of the stomach. Normal cardiopericardial silhouette. No pneumothorax, effusion or edema. There is a left retrocardiac opacity, consolidation. Overlapping cardiac leads. Chest x-ray is slightly rotated to the left. Limited x-ray of the upper abdomen has a gas in nondilated loops of bowel. The stomach is distended with air. IMPRESSION: ET tube and enteric tube in place. Left retrocardiac opacity.  Recommend follow-up Electronically Signed   By: Adrianna Horde M.D.   On: 02/13/2023 16:14   DG Abd Portable 1 View Result Date: 02/13/2023 CLINICAL DATA:  ET tube and enteric tube placement EXAM: PORTABLE CHEST 1 VIEW and limited abdominal x-ray for tube placement COMPARISON:  Chest x-ray 11/12/2022 and older. FINDINGS: ET tube in place with tip seen proximally 5 cm above the carina. Enteric tube with the tip overlying the midbody of the stomach. Normal cardiopericardial silhouette. No pneumothorax, effusion or edema. There is a left retrocardiac opacity, consolidation. Overlapping cardiac leads. Chest x-ray is slightly rotated to the left. Limited x-ray of the upper abdomen has a gas in nondilated loops of bowel. The stomach is distended with air. IMPRESSION: ET tube and enteric tube in place. Left retrocardiac opacity.  Recommend follow-up Electronically Signed   By: Adrianna Horde M.D.   On: 02/13/2023 16:14      Results Procedure: Incision and drainage of sacral decubitus ulcer Description: A healing sacral decubitus ulcer at the top of the buttocks with a small pocket of abscess and pus was drained and sent for wound culture.  Procedure: Incision and drainage of gluteal abscesses Description: Multiple tender abscesses on the right gluteal cleft border were drained to some degree before it was too painful to continue. Wound cultures will be sent.  The gluteal abscesses were very tender but not on the rectum.   Expression Only (No Instrumentation):  just squeezed or expressed pus through an existing  opening--without any scalpel or needle puncture--there is no separate procedure code. The drainage is bundled into  the E/M service for the encounter   Assessment & Plan Relapsing remitting multiple sclerosis (HCC) His multiple sclerosis is managed with Ocrelizumab  infusions every 6 months. He is due for an infusion that requires neurologist oversight. An appointment with Dr. Godwin Lat is scheduled via direct coordination with their office, after discussion of other options.  Ensure the Ocrelizumab  infusion is scheduled and administered as due. Abscess of gluteal cleft -We reviewed the etiology of recurrent abscesses of skin.  Skin abscesses are collections of pus within the dermis and deeper skin tissues. Skin abscesses manifest as painful, tender, fluctuant, and erythematous nodules, frequently surmounted by a pustule and surrounded by a rim of erythematous swelling.  Spontaneous drainage of purulent material may occur.  Fever can occur on occasion.   -Skin abscesses can develop in healthy individuals with no predisposing conditions other than skin or nasal carriage of Staphylococcus aureus.  Individuals in close contact with others who have active infection with skin abscesses are at increased risk which is likely to explain why twin brother has similar episodes.   In addition, any process leading to a breach in the skin barrier can also predispose to the development of a skin abscesses, such as atopic dermatitis.    He has a healing sacral decubitus ulcer with a small pocket of abscess and pus, and multiple hypersensitive abscesses on the right gluteal cleft border. These are partially drained, and wound cultures have been sent. The abscesses are likely follicular due to prolonged sitting and skin breakdown. He experiences significant pain during drainage, with a risk of infection spreading if not managed. He needs improved seating and padding to prevent further skin breakdown. Prescribe doxycycline  for 10  days and schedule a follow-up appointment in 7-10 days to reassess and potentially drain remaining abscesses. He should monitor for signs of spreading infection or fever and seek hospital care if these occur. Pressure injury, stage 2, unspecified location [facility]) Mostly healed but with a small central area not healed due to tiny abscess, just superior to gluteal cleft. Seizure disorder [facility]) He experiences breakthrough seizures and has difficulty obtaining a neurology referral. Dissatisfied with his previous neurologist, Dr. Larraine Plush, he has nonetheless scheduled an appointment with him next Thursday at 8:45 AM due to limited options. He should attend this appointment.      Orders Placed During this Encounter:   Orders Placed This Encounter  Procedures   Wound culture    Source:   buttocks abscess    Release to patient:   Immediate [1]   [id number]   Meds ordered this encounter  Medications   doxycycline  (VIBRA -TABS) 100 MG tablet    Sig: Take 1 tablet (100 mg total) by mouth 2 (two) times daily.    Dispense:  20 tablet    Refill:  0            Additional Info: This encounter employed state-of-the-art, real-time, collaborative documentation. The patient actively reviewed and assisted in updating their electronic medical record on a shared screen, ensuring transparency and facilitating joint problem-solving for the problem list, overview, and plan. This approach promotes accurate, informed care. The treatment plan was discussed and reviewed in detail, including medication safety, potential side effects, and all patient questions. We confirmed understanding and comfort with the plan. Follow-up instructions were established, including contacting the office for any concerns, returning if symptoms worsen, persist, or new symptoms develop, and precautions for potential emergency department visits.  I have personally spent 32 minutes involved in face-to-face and non-face-to-face  activities for this  patient on the day of the visit. Professional time spent includes the following activities: Preparing to see the patient (review of tests), Obtaining and/or reviewing separately obtained history (admission/discharge record), Performing a medically appropriate examination and/or evaluation , Ordering medications/tests/procedures, referring and communicating with other health care professionals, Documenting clinical information in the EMR, Independently interpreting results (not separately reported), Communicating results to the patient/family/caregiver, Counseling and educating the patient/family/caregiver and Care coordination (not separately reported).     This document was synthesized by artificial intelligence ([employer]) using HIPAA-compliant recording of the clinical interaction;   We discussed the use of AI scribe software for clinical note transcription with the patient, who gave verbal consent to proceed.

## 2023-05-17 ENCOUNTER — Ambulatory Visit: Admitting: Internal Medicine

## 2023-05-18 ENCOUNTER — Ambulatory Visit: Admitting: Physical Therapy

## 2023-05-18 ENCOUNTER — Encounter: Admitting: Occupational Therapy

## 2023-05-18 ENCOUNTER — Ambulatory Visit: Admitting: Neurology

## 2023-05-18 LAB — WOUND CULTURE

## 2023-05-18 LAB — TIQ-NTM

## 2023-05-30 ENCOUNTER — Ambulatory Visit (INDEPENDENT_AMBULATORY_CARE_PROVIDER_SITE_OTHER): Admitting: Neurology

## 2023-05-30 ENCOUNTER — Encounter: Payer: Self-pay | Admitting: Neurology

## 2023-05-30 VITALS — BP 114/78 | HR 69 | Ht 68.0 in | Wt 169.0 lb

## 2023-05-30 DIAGNOSIS — R269 Unspecified abnormalities of gait and mobility: Secondary | ICD-10-CM | POA: Diagnosis not present

## 2023-05-30 DIAGNOSIS — R5383 Other fatigue: Secondary | ICD-10-CM

## 2023-05-30 DIAGNOSIS — G35A Relapsing-remitting multiple sclerosis: Secondary | ICD-10-CM

## 2023-05-30 DIAGNOSIS — G35 Multiple sclerosis: Secondary | ICD-10-CM

## 2023-05-30 DIAGNOSIS — Z79899 Other long term (current) drug therapy: Secondary | ICD-10-CM | POA: Diagnosis not present

## 2023-05-30 DIAGNOSIS — G40909 Epilepsy, unspecified, not intractable, without status epilepticus: Secondary | ICD-10-CM

## 2023-05-30 DIAGNOSIS — G35D Multiple sclerosis, unspecified: Secondary | ICD-10-CM

## 2023-05-30 MED ORDER — DIVALPROEX SODIUM 250 MG PO DR TAB: DELAYED_RELEASE_TABLET | ORAL | 11 refills | Status: AC

## 2023-05-30 MED ORDER — CLOBAZAM 10 MG PO TABS: 10.0000 mg | ORAL_TABLET | Freq: Every day | ORAL | 5 refills | Status: DC

## 2023-05-30 NOTE — Progress Notes (Addendum)
 [facility]  PATIENT: Miguel Black DOB: 05/24/1990  REFERRING DOCTOR OR PCP:  Lannie Purdue (PCP) SOURCE: Patient, notes from emergency room, lab results and imaging studies personally reviewed.  _________________________________   HISTORICAL  CHIEF COMPLAINT:  Chief Complaint  Patient presents with   Follow-up    Pt in room 10 alone. Here for MS follow up DMT: Ocrevus . Last infusion:11/23/22. Missed April 30 infusion.  Pt reports doing okay, pt reports a seizure on 04/25/23. Pt reports at least 3-4 seizures this year. Pt is concerned Ocrevus  is causing seizures.     HISTORY OF PRESENT ILLNESS:  He is a 32 y.o. man with multiple sclerosis and seizures.  Update 05/30/2023: He feels his MS is stable.     He is on Ocrevus , and is overdue his Ocrevus  now..   No exacerbations or new neurologic symptoms.   However, he does feel the left leg is a little bit weaker and more spastic than last year which affects his gait  He has had more seizures, and status epilepticus 04/24/2023.   He is on Onfi  and Depakote .  He was hospitalized for several days due to the status epilepticus with changes of medication be made for better control.  He reports that he has been more compliant with medications since the hospital admission.  He does not believe he has had any more seizures  EEG 04/25/2023 showed This study is suggestive of cortical dysfunction arising from lleft fronto-temporal region likely secondary to underlying structural abnormality, post-ictal state. Additionally there is moderate diffuse encephalopathy. No seizures or epileptiform discharges were seen throughout the recording.   Gait is stable.  He uses a cane to ambulate due to ataxia more than weakness though he has both.   He has left leg > arm weakness.   He has left leg stiffness/spasticity as well. He can go across the room without a cane and > 200 feet with it.  [name]  He notes some slowing of the left hand and it feels  heavier - unchanged.   Bladder function is fine.   He notes vision is reduced on the left and he has color desaturation out of the left eye.   His right eye has good vision.      He notes fatigue some afternoons but is good in the mornings.  Fatigue is physical > cognitive.   He sleeps well.    He feels focus and attention are good though cognitive skills sometimes are a little off.   He has mild depression at times.         MS history:  He was diagnosed with relapsing remitting MS in June 2015 after presenting with reduced vision OS while he was in [facility] [place].   In retrospect he had had some neurologic symptoms a few years before the disgnosis.   He had trouble running before his diagnosis.    He was placed on Tecfidera but he had breakthrough activity.     Just a year later, he was switched to Tysabri and has done better.  He moved to [facility]  June 2019 and started seeing the MS Center at [facility] (Dr. Abou Zeid).   He has not had any exacerbations and feels his MS has been stable.   Initially, he continue Tysabri.  He tolerated Tysabri well but going back and forth to an infusion center monthly with difficulty.    He has been JCV Ab negative.   His last infusion was  off Tysabri was in summer 2019 and he switched to Ocrevus  at the end of 2019.   His last JCV Ab test I have a result for is 07/04/2017 (negative 0.14).   I saw him 12/2018 and he transferred infusions to [facility].      Seizure history:  His first one was 2018 and second one was 07/12/2018.   He notes being dehydrated the second one.    Both seizures were GTC by description with LOC.    He slept > a day after each one.   He was started on Keppra  but stopped after a week or so due to side effects.  He had a third seizure November 2021 when he was found by his dad in a postictal state.  He was placed on Keppra  has continued to take it.  He had status epilepticus April 24, 2023   Imaging MRI of the brain 07/13/2018 showed multiple T2/flair  hyperintense foci.  Posterior fossa foci are noted in the posterior right medulla, bilateral cerebellar hemispheres, pons, right cerebellar peduncle and cerebral peduncle.  There are foci in the thalamus and multiple foci in the periventricular, juxtacortical and deep white matter.  None of the foci appear to be acute or enhance.  MRI brain 08/30/2020 showed Multiple T2/FLAIR hyperintense foci in the cerebral hemispheres, thalamus, brainstem and cerebellum in a pattern and configuration consistent with chronic demyelinating plaque associated with multiple sclerosis. None of the foci enhance or appear to be acute. However, 1 focus in the juxtacortical white matter of the posterior left frontal lobe was present on the current scan but not on the MRI from 07/13/2018    Lab tests: Hepatitis B labs from 2020 at [facility] showed no evidence of chronic infection.  :  TB Gold was negative 01/22/2019.  Vitamin D  01/22/2019 was 30.1.  IgG/IgA/IgM was normal 01/22/2019.  CD19/CD20 showed less then 0.5% of lymphocytes were B cells.    REVIEW OF SYSTEMS: Constitutional: No fevers, chills, sweats, or change in appetite Eyes: He notes reduced vision OS.  He sometimes has double vision. Ear, nose and throat: No hearing loss, ear pain, nasal congestion, sore throat Cardiovascular: No chest pain, palpitations Respiratory:  No shortness of breath at rest or with exertion.   No wheezes GastrointestinaI: No nausea, vomiting, diarrhea, abdominal pain, fecal incontinence Genitourinary:  No dysuria, urinary retention or frequency.  No nocturia. Musculoskeletal:  No neck pain, back pain Integumentary: No rash, pruritus, skin lesions Neurological: as above Psychiatric: No depression at this time.  No anxiety Endocrine: No palpitations, diaphoresis, change in appetite, change in weigh or increased thirst Hematologic/Lymphatic:  No anemia, purpura, petechiae. Allergic/Immunologic: No itchy/runny eyes, nasal congestion,  recent allergic reactions, rashes  ALLERGIES: Allergies  Allergen Reactions   Ampyra  [Dalfampridine ] Other (See Comments)    Seizure 02/2021 while on dalfampridine     HOME MEDICATIONS:  Current Outpatient Medications:    doxycycline  (VIBRA -TABS) 100 MG tablet, Take 1 tablet (100 mg total) by mouth 2 (two) times daily., Disp: 20 tablet, Rfl: 0   levOCARNitine  (CARNITOR ) 1 GM/10ML solution, Take 5 mLs (500 mg total) by mouth 2 (two) times daily., Disp: 118 mL, Rfl: 12   Multiple Vitamin (MULTIVITAMIN WITH MINERALS) TABS tablet, Take 1 tablet by mouth daily., Disp: , Rfl:    ocrelizumab  (OCREVUS ) 300 MG/10ML injection, Inject 20 mLs (600 mg total) into the vein every 6 (six) months., Disp: 20 mL, Rfl: 1   cloBAZam  (ONFI ) 10 MG tablet, Take 1 tablet (10 mg  total) by mouth at bedtime., Disp: 30 tablet, Rfl: 5   diazePAM , 15 MG Dose, (VALTOCO  15 MG DOSE) 2 x 7.5 MG/0.1ML LQPK, Place 1 Dose into the nose daily as needed. (Patient not taking: Reported on 05/10/2023), Disp: 2 each, Rfl: 2   divalproex  (DEPAKOTE ) 250 MG DR tablet, Take 1 tablet (250 mg total) by mouth every morning AND 2 tablets (500 mg total) at bedtime., Disp: 90 tablet, Rfl: 11   docusate sodium  (COLACE) 100 MG capsule, Take 1 capsule (100 mg total) by mouth 2 (two) times daily as needed for mild constipation. (Patient not taking: Reported on 05/10/2023), Disp: 10 capsule, Rfl: 0   doxycycline  (VIBRA -TABS) 100 MG tablet, Take 1 tablet (100 mg total) by mouth 2 (two) times daily., Disp: 14 tablet, Rfl: 0  PAST MEDICAL HISTORY: Past Medical History:  Diagnosis Date   Acute encephalopathy 04/26/2022   Acute metabolic encephalopathy 04/26/2022   Acute respiratory failure with hypoxia (HCC) 08/23/2021   Aspiration pneumonia (HCC) 07/12/2018   Closed fracture of distal phalanx or phalanges of hand 02/05/2009   Formatting of this note might be different from the original. Overview: The original diagnosis was replaced automatically by  the ICD-10 team to an ICD-10 compliant one. Please review for accuracy   Encounter for screening for HIV 07/12/2018   Foot drop 06/03/2022   Has a left foot drop ever since 2015 first flare of multiple sclerosis had a brace in 2022 custom but does not actually do anything to lift the from the foot physical therapist advised him to get an alternative foot drop brace but I cannot find record of that   Marijuana abuse 05/01/2013   MS (multiple sclerosis)    Seizure (HCC)    Seizures (HCC) 01/22/2019   Status epilepticus (HCC) 07/12/2018    PAST SURGICAL HISTORY: Past Surgical History:  Procedure Laterality Date   NO PAST SURGERIES      FAMILY HISTORY: Family History  Problem Relation Age of Onset   Arthritis Mother    Hypertension Mother    Healthy Father    Healthy Sister    Healthy Brother    Hodgkin's lymphoma Brother    Colon cancer Maternal Uncle 45   Kidney disease Maternal Grandmother    Hypertension Maternal Grandmother    Arthritis Maternal Grandmother    Diabetes Maternal Grandmother    Stroke Maternal Grandfather    Hypertension Maternal Grandfather    Diabetes Paternal Grandmother     SOCIAL HISTORY:  Social History   Socioeconomic History   Marital status: Single    Spouse name: Not on file   Number of children: 0   Years of education: 11   Highest education level: Not on file  Occupational History   Occupation: Unemployed  Tobacco Use   Smoking status: Some Days    Types: Cigars   Smokeless tobacco: Never   Tobacco comments:    2.5-3 per day  Vaping Use   Vaping status: Never Used  Substance and Sexual Activity   Alcohol use: Not Currently    Comment: sometimes    Drug use: Yes    Types: Marijuana    Comment: Reports he smokes a lot, cannot put a number to it   Sexual activity: Not Currently  Other Topics Concern   Not on file  Social History Narrative   Left handed   Coffee every day   Lives with family    Social Drivers of Manufacturing Engineer Strain:  Not on file  Food Insecurity: No Food Insecurity (04/30/2023)   Hunger Vital Sign    Worried About Running Out of Food in the Last Year: Never true    Ran Out of Food in the Last Year: Never true  Transportation Needs: No Transportation Needs (04/30/2023)   PRAPARE - Administrator, Civil Service (Medical): No    Lack of Transportation (Non-Medical): No  Physical Activity: Not on file  Stress: Not on file  Social Connections: Unknown (10/13/2021)   Received from [facility]   Social Network    Social Network: Not on file  Intimate Partner Violence: Not At Risk (04/30/2023)   Humiliation, Afraid, Rape, and Kick questionnaire    Fear of Current or Ex-Partner: No    Emotionally Abused: No    Physically Abused: No    Sexually Abused: No     PHYSICAL EXAM  Vitals:   05/30/23 0920  BP: 114/78  Pulse: 69  Weight: 169 lb (76.7 kg)  Height: 5' 8 (1.727 m)    Body mass index is 25.7 kg/m.   General: The patient is well-developed and well-nourished and in no acute distress  HEENT:  Head is /AT.  Sclera are anicteric.     Skin: Extremities are without rash or edema.  Neurologic Exam  Mental status: The patient is alert and oriented x 3 at the time of the examination. The patient has apparent normal recent and remote memory, with an apparently normal attention span and concentration ability.   Speech is normal.  Cranial nerves: Extraocular movements show right incomplete INO. Some nystagmus in all gaze,  Pupils are equal, round, and reactive to light and accomodation.  Mild reduced acuity and color saturation OS vs OD.  Facial strength was normal.  There is good facial sensation to soft touch bilaterally. Trapezius and sternocleidomastoid strength is normal. No dysarthria is noted.   No obvious hearing deficits are noted.  Motor:  Muscle bulk is normal.   Tone is increased in legs. Strength is  4+/5 in left arm and left iliopsoas and 5 / 5  elsewhere .   Reduced left RAM  Sensory: Sensory testing is intact to pinprick, soft touch and vibration sensation in all 4 extremities.  Coordination: Cerebellar testing reveals reduced finger-nose-finger and heel-to-shin left worse than right.     Gait and station: Station is normal.  Gait is spastic with a left foot drop... He is unable to do tandem walk.  Romberg is borderline.   Reflexes: Deep tendon reflexes are increased in legs, left > right; crossed adductors (L>R) and nonsustained left ankle clonus.      DIAGNOSTIC DATA (LABS, IMAGING, TESTING) - I reviewed patient records, labs, notes, testing and imaging myself where available.  Lab Results  Component Value Date   WBC 6.6 05/30/2023   HGB 16.7 05/30/2023   HCT 49.6 05/30/2023   MCV 98 (H) 05/30/2023   PLT 237 05/30/2023      Component Value Date/Time   NA 143 04/29/2023 0650   NA 140 07/22/2019 1502   K 4.3 04/29/2023 0650   CL 110 04/29/2023 0650   CO2 24 04/29/2023 0650   GLUCOSE 94 04/29/2023 0650   BUN 12 04/29/2023 0650   BUN 12 07/22/2019 1502   CREATININE 0.91 04/29/2023 0650   CREATININE 1.01 06/03/2022 1601   CALCIUM  8.9 04/29/2023 0650   PROT 6.5 04/27/2023 0554   PROT 7.2 07/22/2019 1502   ALBUMIN 3.5 04/27/2023 0554  ALBUMIN 4.9 07/22/2019 1502   AST 14 (L) 04/27/2023 0554   ALT 12 04/27/2023 0554   ALKPHOS 41 04/27/2023 0554   BILITOT 0.7 04/27/2023 0554   BILITOT <0.2 07/22/2019 1502   GFRNONAA >60 04/29/2023 0650   GFRAA 99 07/22/2019 1502     Lab Results  Component Value Date   TSH 0.625 01/22/2019       ASSESSMENT AND PLAN  Multiple sclerosis, relapsing-remitting  Multiple sclerosis  Seizure disorder (HCC)  High risk medication use - Plan: CBC with Differential/Platelet, IgG, IgA, IgM, Hep B Surface Antigen  Gait disturbance  Other fatigue   1. He will continue Ocrevus .   Recheck IgM/IgG/IgA and CBC with differential and HepBsAg today.    2.  He will continue his  current medical regimen for the seizures.  We discussed the importance of medical compliance.  I sent in refills 3.  He prefers using a cane rather than a walker.  Advised to use a walker if he feels more unsteady avoid dalfampridine .  Stay active, exercise as tolerated.  .    4.     Return to see us  in 6 months or sooner for new or worsening neurologic symptoms.   This visit is part of a comprehensive longitudinal care medical relationship regarding the patients primary diagnosis of MS and related concerns.    Pooja Camuso A. Vear, MD, [facility] 12/05/2023, 12:23 PM Certified in Neurology, Clinical Neurophysiology, Sleep Medicine and Neuroimaging  [facility] [address], [address] [place], [place] [address] [phone]

## 2023-05-30 NOTE — Progress Notes (Signed)
 Gave updated signed order below to [facility] to start processing.

## 2023-05-31 LAB — CBC WITH DIFFERENTIAL/PLATELET
Basophils Absolute: 0.1 10*3/uL (ref 0.0–0.2)
Basos: 1 %
EOS (ABSOLUTE): 0.1 10*3/uL (ref 0.0–0.4)
Eos: 1 %
Hematocrit: 49.6 % (ref 37.5–51.0)
Hemoglobin: 16.7 g/dL (ref 13.0–17.7)
Immature Grans (Abs): 0 10*3/uL (ref 0.0–0.1)
Immature Granulocytes: 1 %
Lymphocytes Absolute: 1.7 10*3/uL (ref 0.7–3.1)
Lymphs: 26 %
MCH: 33.1 pg — ABNORMAL HIGH (ref 26.6–33.0)
MCHC: 33.7 g/dL (ref 31.5–35.7)
MCV: 98 fL — ABNORMAL HIGH (ref 79–97)
Monocytes Absolute: 0.4 10*3/uL (ref 0.1–0.9)
Monocytes: 6 %
Neutrophils Absolute: 4.4 10*3/uL (ref 1.4–7.0)
Neutrophils: 65 %
Platelets: 237 10*3/uL (ref 150–450)
RBC: 5.04 x10E6/uL (ref 4.14–5.80)
RDW: 13.6 % (ref 11.6–15.4)
WBC: 6.6 10*3/uL (ref 3.4–10.8)

## 2023-05-31 LAB — IGG, IGA, IGM
IgA/Immunoglobulin A, Serum: 138 mg/dL (ref 90–386)
IgG (Immunoglobin G), Serum: 1285 mg/dL (ref 603–1613)
IgM (Immunoglobulin M), Srm: 24 mg/dL (ref 20–172)

## 2023-05-31 LAB — HEPATITIS B SURFACE ANTIGEN: Hepatitis B Surface Ag: NEGATIVE

## 2023-06-08 ENCOUNTER — Ambulatory Visit: Payer: Self-pay | Admitting: Neurology

## 2023-06-14 ENCOUNTER — Encounter: Payer: Self-pay | Admitting: Physical Therapy

## 2023-06-14 ENCOUNTER — Ambulatory Visit: Attending: Internal Medicine | Admitting: Physical Therapy

## 2023-06-14 ENCOUNTER — Ambulatory Visit: Admitting: Occupational Therapy

## 2023-06-14 ENCOUNTER — Other Ambulatory Visit: Payer: Self-pay

## 2023-06-14 DIAGNOSIS — R270 Ataxia, unspecified: Secondary | ICD-10-CM | POA: Insufficient documentation

## 2023-06-14 DIAGNOSIS — R278 Other lack of coordination: Secondary | ICD-10-CM | POA: Diagnosis present

## 2023-06-14 DIAGNOSIS — G40901 Epilepsy, unspecified, not intractable, with status epilepticus: Secondary | ICD-10-CM | POA: Diagnosis not present

## 2023-06-14 DIAGNOSIS — R2689 Other abnormalities of gait and mobility: Secondary | ICD-10-CM | POA: Insufficient documentation

## 2023-06-14 DIAGNOSIS — R2681 Unsteadiness on feet: Secondary | ICD-10-CM | POA: Diagnosis present

## 2023-06-14 DIAGNOSIS — M6281 Muscle weakness (generalized): Secondary | ICD-10-CM | POA: Diagnosis present

## 2023-06-14 NOTE — Therapy (Signed)
 OUTPATIENT PHYSICAL THERAPY NEURO EVALUATION   Patient Name: Miguel Black MRN: [id number] DOB:January 01, 1991, 33 y.o., male Today's Date: 06/14/2023   PCP: PCP - General, Internal Medicine  REFERRING PROVIDER: Feliciana Horn, MD >F/U with Dr. Hortensia Ma  END OF SESSION:  PT End of Session - 06/14/23 1111     Visit Number 1    Number of Visits 1    Authorization Type Medicare/Medicaid of [place]    PT Start Time 1107    PT Stop Time 1143    PT Time Calculation (min) 36 min    Equipment Utilized During Treatment Gait belt    Activity Tolerance Patient tolerated treatment well    Behavior During Therapy WFL for tasks assessed/performed             Past Medical History:  Diagnosis Date   Acute encephalopathy 04/26/2022   Acute metabolic encephalopathy 04/26/2022   Acute respiratory failure with hypoxia (HCC) 08/23/2021   Aspiration pneumonia (HCC) 07/12/2018   Closed fracture of distal phalanx or phalanges of hand 02/05/2009   Formatting of this note might be different from the original. Overview: The original diagnosis was replaced automatically by the ICD-10 team to an ICD-10 compliant one. Please review for accuracy   Encounter for screening for HIV 07/12/2018   Foot drop 06/03/2022   Has a left foot drop ever since 2015 first flare of multiple sclerosis had a brace in 2022 custom but does not actually do anything to lift the from the foot physical therapist advised him to get an alternative foot drop brace but I cannot find record of that   Marijuana abuse 05/01/2013   MS (multiple sclerosis) (HCC)    Seizure (HCC)    Seizures (HCC) 01/22/2019   Status epilepticus (HCC) 07/12/2018   Past Surgical History:  Procedure Laterality Date   NO PAST SURGERIES     Patient Active Problem List   Diagnosis Date Noted   Abscess of gluteal cleft 05/10/2023   Seizure (HCC) 04/25/2023   Seizures (HCC) 11/12/2022   Breakthrough seizure (HCC) 10/07/2022   Leukocytosis 10/07/2022    Hypocalcemia 10/07/2022   Elevated AST (SGOT) 10/07/2022   Status epilepticus (HCC) 06/13/2022   Ambulates with cane 06/04/2022   Ataxia 06/04/2022   Foot drop 06/03/2022   Vitamin D  deficiency 06/03/2022   Smokes cigars 06/03/2022   Neurological deficit present 04/26/2022   History of optic neuritis 07/29/2020   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   High risk medication use 01/28/2020   Seizure disorder (HCC) 12/04/2019   Marijuana use 12/04/2019   Gait disturbance 01/22/2019   Left-sided weakness 01/22/2019   Other fatigue 01/22/2019   multiple sclerosis 07/12/2018   Hypermetropia of left eye 05/30/2016    ONSET DATE: 05/01/2023 (MD referral post hospitalization)  REFERRING DIAG:  G40.901 (ICD-10-CM) - Status epilepticus (HCC)  R27.0 (ICD-10-CM) - Ataxia    THERAPY DIAG:  Unsteadiness on feet  Other abnormalities of gait and mobility  Muscle weakness (generalized)  Rationale for Evaluation and Treatment: Rehabilitation  SUBJECTIVE:  SUBJECTIVE STATEMENT: Wearing a new brace, around my knee.  Had a few falls since getting out of the hospital.  For exercise:  I do squats, weights.  When I first got out of hospital, I couldn't do anything, but I feel better.  I think I'm a little better than when I was here for therapy last. Pt accompanied by: self  PERTINENT HISTORY: MS, hx of seizures He has had more seizures, and status epilepticus 04/24/2023.   He is on Onfi  and Depakote .  He was hospitalized for several days due to the status epilepticus with changes of medication be made for better control.  He reports that he has been more compliant with medications since the hospital admission.  He does not believe he has had any more seizures   PAIN:  Are you having pain?  No  PRECAUTIONS: Fall and Other: wears L AFO  RED FLAGS: None   WEIGHT BEARING RESTRICTIONS: No  FALLS: Has patient fallen in last 6 months? Yes. Number of falls 2  LIVING ENVIRONMENT: Lives with: lives with their family Lives in: House/apartment Stairs: Inside and outside steps with rails Has following equipment at home: Quad cane small base  PLOF: Independent with household mobility with device and Independent with community mobility with device  PATIENT GOALS: Work on flexibility  OBJECTIVE:  Note: Objective measures were completed at Evaluation unless otherwise noted.  DIAGNOSTIC FINDINGS: EEG 04/25/2023 showed "This study is suggestive of cortical dysfunction arising from lleft fronto-temporal region likely secondary to underlying structural abnormality, post-ictal state. Additionally there is moderate diffuse encephalopathy. No seizures or epileptiform discharges were seen throughout the recording. "  COGNITION: Overall cognitive status: Has hx of decreased safety awareness (from previous bouts of therapy)   MUSCLE TONE: Increased tone LLE, mild increase in tone RLE   LOWER EXTREMITY ROM:     Active  Right Eval Left Eval  Hip flexion    Hip extension    Hip abduction    Hip adduction    Hip internal rotation    Hip external rotation    Knee flexion    Knee extension  0  Ankle dorsiflexion  +20  Ankle plantarflexion    Ankle inversion    Ankle eversion     (Blank rows = not tested)  LOWER EXTREMITY MMT:    MMT Right Eval Left Eval  Hip flexion 5 4  Hip extension    Hip abduction    Hip adduction    Hip internal rotation    Hip external rotation    Knee flexion 5 4  Knee extension 5 4  Ankle dorsiflexion 4 3+  Ankle plantarflexion    Ankle inversion    Ankle eversion    (Blank rows = not tested)   TRANSFERS: Sit to stand: Modified independence  Assistive device utilized: None     Stand to sit: Modified independence  Assistive device utilized:  None      GAIT: Findings: Gait Characteristics: step through pattern, decreased hip/knee flexion- Left, decreased ankle dorsiflexion- Left, circumduction- Left, genu recurvatum- Left, and ataxic, Distance walked: 50 ft x 2, Assistive device utilized:Single point cane, Level of assistance: Modified independence, and Comments: *this is pt's baseline pattern; he is wearing knee sleeve, not AFO on LLE  FUNCTIONAL TESTS:  5 times sit to stand: 19.71 sec (*compared to 27.66 sec) Timed up and go (TUG): 21.07 sec (*compared to 27.53 sec) 10 meter walk test: 21.47 sec = 1.52 ft/sec (compared to 1 ft/sec)  TREATMENT DATE: 06/14/2023    PATIENT EDUCATION: Education details: Eval results,Re-issued previous HEP Person educated: Patient Education method: Programmer, multimedia, Facilities manager, Verbal cues, and Handouts Education comprehension: verbalized understanding and returned demonstration  HOME EXERCISE PROGRAM: Access Code: [email or web address] URL: [email or web address] Date: 06/14/2023 Prepared by: [facility] - Outpatient  Rehab - [facility]  Exercises - Mini Squat with Counter Support  - 1 x daily - 5 x weekly - 3 sets - 5 reps - Sit to Stand  - 1 x daily - 5 x weekly - 3 sets - 5 reps - Supine March  - 1 x daily - 5 x weekly - 2 sets - 10 reps - Supine Bridge  - 1 x daily - 5 x weekly - 2-3 sets - 5 reps - Sit to stand in stride stance  - 1 x daily - 5 x weekly - 3 sets - 5 reps - Standing Gastroc Stretch at Counter  - 1-2 x daily - 7 x weekly - 1 sets - 3 reps - 15-30 sec hold - Standing Tandem Balance with Counter Support  - 1 x daily - 5 x weekly - 1 sets - 3 reps - 15 sec hold - Single Leg Stance with Support  - 1 x daily - 5 x weekly - 1 sets - 3 reps - 10 sec hold  GOALS: Goals reviewed with patient? Yes  LONG TERM GOALS: Target date: 06/14/2023  Pt will  be independent with HEP for improved flexibility, strength, transfers.  Baseline:  Goal status: MET  ASSESSMENT:  CLINICAL IMPRESSION: Patient is a 33 y.o. male who was seen today for physical therapy evaluation and treatment s/p seizure episode/hospitalization 04/24/23.  He has a hx of MS, and at the time of his most recent hospitalization, he was being seen here for OPPT.  Pt returns today for evaluation, and reports he feels better than when he was previously here for therapy.  Objective measures compared to previous bout:  pt has improved with FTST, TUG, and gait velocity measures.  He does remain at fall risk, but this is better than his typical baseline.  He is using cane and has a sleeve-type knee brace.  Educated pt to continue his previously given HEP (re-printed for his reference), use his cane at all times, and to ideally use his AFO for better LLE stability with gait.  He verbalizes understanding and is in agreement with PT to continue exercises at home, with no specific PT needs at this time.   OBJECTIVE IMPAIRMENTS: decreased balance and difficulty walking.   ACTIVITY LIMITATIONS: locomotion level  PARTICIPATION LIMITATIONS: community activity  PERSONAL FACTORS: 3+ comorbidities: see above are also affecting patient's functional outcome.   REHAB POTENTIAL: Good  CLINICAL DECISION MAKING: Stable/uncomplicated  EVALUATION COMPLEXITY: Low  PLAN:  PT FREQUENCY: one time visit  PT DURATION: other: 1 visit (eval only)-status improved from his baseline  PLANNED INTERVENTIONS: 97110-Therapeutic exercises and 62130- Self Care  PLAN FOR NEXT SESSION: No further PT at this time, due to pt improved from/near his baseline for functional mobility.   Kelsey Patricia., PT 06/14/2023, 12:50 PM  [facility] [address] [place], [address] [place], [place] [address] Phone # [phone] Fax # [phone]

## 2023-06-14 NOTE — Therapy (Signed)
 OUTPATIENT OCCUPATIONAL THERAPY NEURO EVALUATION  Patient Name: Miguel Black MRN: [id number] DOB:1990/11/09, 33 y.o., male Today's Date: 06/14/2023  PCP: Anthon Kins, MD REFERRING PROVIDER: Feliciana Horn, MD  END OF SESSION:  OT End of Session - 06/14/23 1249     Visit Number 1    Number of Visits 1    Authorization Type Medicare A&B / [place] Medicaid 2025    OT Start Time 1151    OT Stop Time 1217    OT Time Calculation (min) 26 min    Behavior During Therapy WFL for tasks assessed/performed             Past Medical History:  Diagnosis Date   Acute encephalopathy 04/26/2022   Acute metabolic encephalopathy 04/26/2022   Acute respiratory failure with hypoxia (HCC) 08/23/2021   Aspiration pneumonia (HCC) 07/12/2018   Closed fracture of distal phalanx or phalanges of hand 02/05/2009   Formatting of this note might be different from the original. Overview: The original diagnosis was replaced automatically by the ICD-10 team to an ICD-10 compliant one. Please review for accuracy   Encounter for screening for HIV 07/12/2018   Foot drop 06/03/2022   Has a left foot drop ever since 2015 first flare of multiple sclerosis had a brace in 2022 custom but does not actually do anything to lift the from the foot physical therapist advised him to get an alternative foot drop brace but I cannot find record of that   Marijuana abuse 05/01/2013   MS (multiple sclerosis) (HCC)    Seizure (HCC)    Seizures (HCC) 01/22/2019   Status epilepticus (HCC) 07/12/2018   Past Surgical History:  Procedure Laterality Date   NO PAST SURGERIES     Patient Active Problem List   Diagnosis Date Noted   Abscess of gluteal cleft 05/10/2023   Seizure (HCC) 04/25/2023   Seizures (HCC) 11/12/2022   Breakthrough seizure (HCC) 10/07/2022   Leukocytosis 10/07/2022   Hypocalcemia 10/07/2022   Elevated AST (SGOT) 10/07/2022   Status epilepticus (HCC) 06/13/2022   Ambulates with cane 06/04/2022    Ataxia 06/04/2022   Foot drop 06/03/2022   Vitamin D  deficiency 06/03/2022   Smokes cigars 06/03/2022   Neurological deficit present 04/26/2022   History of optic neuritis 07/29/2020   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  Pt reports needing a little help when he first got out of the hospital, but he recognizes that he is getting stronger and does not need any help with ADLs. Pt accompanied by: self (mother brought pt)  PERTINENT HISTORY: MS, hx of seizures He has had more seizures, and status epilepticus 04/24/2023.   He is on Onfi  and Depakote .  He was hospitalized for several days due to the status epilepticus with changes of medication be made for better control.  He reports that he has been more compliant with medications since the hospital admission.  He does not believe he  has had any more seizures  PRECAUTIONS: Fall and Other: hx of seizures  WEIGHT BEARING RESTRICTIONS: No  PAIN:  Are you having pain? No  FALLS: Has patient fallen in last 6 months? Yes. Number of falls couple of falls with seizure April 2025  LIVING ENVIRONMENT: Lives with: lives with their family (mom, dad, and brother) Lives in: House/apartment - townhouse Stairs: Yes: Internal: 16 steps; on left going up and External: 8 steps; bilateral but cannot reach both Has following equipment at home: Single point  cane, Wheelchair (manual), and shower chair  PLOF: Independent with basic ADLs and Requires assistive device for independence  PATIENT GOALS: none stated  OBJECTIVE:  Note: Objective measures were completed at Evaluation unless otherwise noted.  HAND DOMINANCE: Left  ADLs: Overall ADLs: Mod I, reports occasional increased time but not requiring any assistance  Transfers/ambulation related to ADLs: utilizes [facility] for mobility Tub Shower transfers: "it's annoying" but no assistance needed Equipment: Shower seat with back  IADLs: Light housekeeping: manages laundry, making the bed, and keeping his room clean Meal Prep: Is able to fix simple meals, but dinner is usually prepared by mom  MOBILITY STATUS: Needs Assist: Mod I with SPC  POSTURE COMMENTS:  No Significant postural limitations  ACTIVITY TOLERANCE: Activity tolerance: WFL  UPPER EXTREMITY ROM:  WFL bilaterally   UPPER EXTREMITY MMT:   Grossly 4/5 on L; 4+ to 5/5 on R   COORDINATION: Finger Nose Finger test: ataxia bilaterally, greater L >R Box and Blocks:  Right 34 blocks, Left 24 blocks (Right: 36 and Left: 23 on eval when seen 03/2021)  SENSATION: WFL  COGNITION: Overall cognitive status: Within functional limits for tasks assessed  VISION: Subjective report: Reports L eye blurry (no changes)                                                                                                                           TREATMENT DATE:  N/A, eval only        PATIENT EDUCATION: Education details: Educated on role and purpose of OT as well as potential interventions and goals for therapy based on initial evaluation findings. Person educated: Patient Education method: Explanation Education comprehension: verbalized understanding  HOME EXERCISE PROGRAM: NA  ASSESSMENT:  CLINICAL IMPRESSION: Patient is a 33 y.o. male who was seen today for occupational therapy evaluation secondary to recent hospitalization  s/p seizure.  Pt with baseline ataxia that has not changed significantly since last seen by OT in 2023.  Pt reports that he was initially weak after d/c from hospital but has returned to baseline from an ADL and IADL standpoint.  Pt currently lives with parents in a townhouse with 8 steps to enter and is on disability prior to onset. PMHx includes MS, seizures, regular marijuana use. Pt with on OT needs at this time as pt reports that he has returned to his baseline.  PERFORMANCE DEFICITS: in functional skills including coordination, balance, endurance, and UE functional use.  IMPAIRMENTS: are limiting patient from ADLs and IADLs.   CO-MORBIDITIES: may have co-morbidities  that affects occupational performance. Patient will benefit from skilled OT to address above impairments and improve overall function.  MODIFICATION OR ASSISTANCE TO COMPLETE EVALUATION: No modification of tasks or assist necessary to complete an evaluation.  OT OCCUPATIONAL PROFILE AND HISTORY: Detailed assessment: Review of records and additional review of physical, cognitive, psychosocial history related to current functional performance.  CLINICAL DECISION MAKING: LOW - limited treatment options, no task modification necessary  REHAB POTENTIAL: Good  EVALUATION COMPLEXITY: Low    PLAN:  OT FREQUENCY: one time visit  OT DURATION: other: 1 visit (eval only)  PLANNED INTERVENTIONS: [address]7535 self care/ADL training  RECOMMENDED OTHER SERVICES: NA  CONSULTED AND AGREED WITH PLAN OF CARE: Patient  PLAN FOR NEXT SESSION: no further OT at this time, due to pt at his baseline for ADLs and coordination   Anthonette Kinsman, OTR/L 06/14/2023, 12:49 PM   [facility] at [facility] [address], [address] [place], [place] [address] Phone # [phone] Fax # [phone]

## 2023-06-27 ENCOUNTER — Telehealth: Payer: Self-pay | Admitting: Neurology

## 2023-06-27 DIAGNOSIS — R5383 Other fatigue: Secondary | ICD-10-CM

## 2023-06-27 DIAGNOSIS — G35 Multiple sclerosis: Secondary | ICD-10-CM

## 2023-06-27 DIAGNOSIS — R269 Unspecified abnormalities of gait and mobility: Secondary | ICD-10-CM

## 2023-06-27 NOTE — Telephone Encounter (Addendum)
 Appears someone referred him on 05/01/23 at [facility] while he was there for admission to [facility]. Phone:  [phone]. Note on referral mentions pt will call once d/c'd. Looks like he had initial PT/OT evaluation with them on 06/14/23.    I called pt. He said he would like more PT and they informed him he needed new referral. I placed new referral.

## 2023-06-27 NOTE — Telephone Encounter (Signed)
 Pt would like a call from RN to discuss his request for PT

## 2023-06-28 ENCOUNTER — Telehealth: Payer: Self-pay | Admitting: Physical Therapy

## 2023-06-28 NOTE — Telephone Encounter (Signed)
 Called and spoke with patient regarding new PT referral received today.  Given that patient was recently evaluated on 06/14/2023 (with no new PT needs identified at that time, due to pt improved from his previous baseline status) and no recent medical status changes since 06/14/2023, PT recommends holding on additional PT evaluation at this time.  Discussed the above with patient, and verbally reviewed his current home exercise recommendations and let him know that our referrals are good for six months.  He verbalizes understanding that we will defer the PT referral for now and check back with patient in the next month to see if new needs arise to warrant PT eval in the future.  Dessie Flow, PT 06/28/23 5:52 PM Phone: [phone] Fax: [phone]  [facility] at [facility] [address] [place], [address] [place], [place] [address] Phone # [phone] Fax # [phone]

## 2023-08-25 ENCOUNTER — Other Ambulatory Visit: Payer: Self-pay | Admitting: Internal Medicine

## 2023-08-25 ENCOUNTER — Telehealth: Payer: Self-pay

## 2023-08-25 DIAGNOSIS — L0231 Cutaneous abscess of buttock: Secondary | ICD-10-CM

## 2023-08-25 NOTE — Telephone Encounter (Signed)
 Spoke with mother over phone about son stated that her son had infusion and now has another cyst filling up at the tailbone again and painful would like to know if provider could send in Doxycycline  for pt I did advise her that if gets worse over the weekend that please take him to ED or urgent care. Also what can he take for it with his infusion he takes now.

## 2023-08-25 NOTE — Telephone Encounter (Signed)
 Copied from CRM [id number]. Topic: Clinical - Medical Advice >> Aug 25, 2023  4:34 PM Jasmin G wrote: Reason for CRM: Pt's mother would like a call back from PCP or one of the nurse's PCP fro pt being prescribed an antibiotic that is needed to treat, please call back ASAP  Tried to call pt mom back no answer left voicemail to call office back

## 2023-08-28 NOTE — Telephone Encounter (Signed)
 Tried to call pt  via phone no answer left detail message per provider advise to make appt at our office or go to urgent care.

## 2023-10-03 ENCOUNTER — Ambulatory Visit: Payer: Self-pay

## 2023-10-03 NOTE — Telephone Encounter (Signed)
 FYI Only or Action Required?: FYI only for provider.  Patient was last seen in primary care on 05/10/2023 by Jesus Bernardino MATSU, MD.  Called Nurse Triage reporting Bed Sore.  Symptoms began Chronic.  Interventions attempted: Nothing.  Symptoms are: stable.  Triage Disposition: See Physician Within 24 Hours  Patient/caregiver understands and will follow disposition?: Yes Reason for Disposition  [1] Looks infected (spreading redness, red streak, pus) AND [2] no fever  Answer Assessment - Initial Assessment Questions Patient's mom calling on behalf of patient. States its not a pressure wound as he is not laying in bed all day, but has this constant sore above tailbone that sometimes pills with pus and gets painful and then becomes infected. States wound triggers seizures. Going out of [place] and wants to be seen before they leave.  1. SYMPTOM or QUESTION: What is your reason for calling today? or How can I best help you? (e.g., bleeding, redness, drainage, pain)     Sore on lower back, will fill up with pus and gets infected.  2. WOUND APPEARANCE: What does the wound look like? Is there new or spreading redness? If Yes, ask: What is the size of the red area? (Inches, centimeters, or compare to size of a coin).  Has the drainage changed or increased?  Is there a new odor?       A little bigger than a half dollar.  3. ONSET: When did the problem begin?     This has happened a couple times before  4. LOCATION: Where is the wound(s)? (e.g., , ankle, lower left leg)     Above tailbone  5. PAIN: Is there any pain? If Yes, ask: How bad is the pain? (Scale 1-10; or mild, moderate, severe)     Mom guesses 3 or 4/10  6. FEVER: Do you have a fever? If Yes, ask: What is your temperature, how was it measured, and when did it start?     No  7. OTHER SYMPTOMS: Do you have any other symptoms? (e.g., chills, rash elsewhere, new weakness)     No  Protocols used: Wound -  Chronic and Negative Pressure Wound Therapy-A-AH  Copied from CRM #[id number]. Topic: Clinical - Red Word Triage >> Oct 03, 2023  3:21 PM Franky GRADE wrote: Red Word that prompted transfer to Nurse Triage: Cyst/Bed sore on his lower back right above the tail bone. Severe Pain. Patient is due to seizures after the cyst develops.

## 2023-10-04 ENCOUNTER — Ambulatory Visit: Admitting: Family Medicine

## 2023-10-04 ENCOUNTER — Encounter: Payer: Self-pay | Admitting: Family Medicine

## 2023-10-04 VITALS — BP 106/65 | HR 62 | Temp 97.3°F | Ht 68.0 in | Wt 175.6 lb

## 2023-10-04 DIAGNOSIS — L0591 Pilonidal cyst without abscess: Secondary | ICD-10-CM

## 2023-10-04 DIAGNOSIS — Z23 Encounter for immunization: Secondary | ICD-10-CM | POA: Diagnosis not present

## 2023-10-04 DIAGNOSIS — G35 Multiple sclerosis: Secondary | ICD-10-CM | POA: Diagnosis not present

## 2023-10-04 MED ORDER — DOXYCYCLINE HYCLATE 100 MG PO TABS: 100.0000 mg | ORAL_TABLET | Freq: Two times a day (BID) | ORAL | 0 refills | Status: DC

## 2023-10-04 NOTE — Progress Notes (Signed)
   Miguel Black is a 33 y.o. male who presents today for an office visit.  Assessment/Plan:  Pilonidal cyst No red flags or signs of systemic infection.  This is been a recurring issue for many months.  Given that the area is already draining, do not think we need to perform an I&D today.  Will start doxycycline  as he did well with this previously.  We did discuss that this would likely continue to recur without surgical removal of the pilonidal cyst.  He is not sure if he would like to proceed with this however we will place a referral to surgery for further evaluation and management.  Area was dressed today.  We discussed reasons to return to care.  MS Follows with neurology.  On ocrevus  infusions every 6 months which is likely contributing to his above recurrent skin infections.    Subjective:  HPI:  See assessment / plan for status of chronic conditions.    Discussed the use of AI scribe software for clinical note transcription with the patient, who gave verbal consent to proceed.  History of Present Illness Miguel Black is a 33 year old male with a history of pilonidal cysts who presents with a recurrent cyst on the tailbone.  He has a history of recurrent pilonidal cysts located on the tailbone, occurring approximately once a month. The frequency of these cysts has decreased since April, with the last occurrence coinciding with a seizure on April 1st. The cysts began to worsen after starting Ocrevus  infusions, and the patient and his family suspect the infusions may be related to the worsening.    In the past, he managed the cysts with doxycycline , which effectively cleared them.. The current cyst began to worsen a few days ago and started draining yesterday.  No fevers or chills.  No specific treatment tried.  He has a history of seizures, which have been linked to infections from the cysts causing fevers. His mother notes that the infections have led to hospitalizations  in the past.         Objective:  Physical Exam: Ht 5' 8 (1.727 m)   Wt 175 lb 9.6 oz (79.7 kg)   BMI 26.70 kg/m   Gen: No acute distress, resting comfortably Skin: Draining pilonidal cyst approximately 3cm in diameter.  Purulent drainage present Neuro: Grossly normal, moves all extremities Psych: Normal affect and thought content      Nikan Ellingson M. Kennyth, MD 10/04/2023 9:55 AM

## 2023-10-04 NOTE — Patient Instructions (Addendum)
 It was very nice to see you today!  VISIT SUMMARY: You visited us  today due to a recurrent pilonidal cyst on your tailbone, which has been causing discomfort and impacting your quality of life. We discussed the potential link between your Ocrevus  infusions and the worsening of your cysts, and we have a plan to manage your current infection and prevent future occurrences.  YOUR PLAN: PILONIDAL CYST WITH RECURRENT INFECTION: You have a chronic pilonidal cyst that has been getting infected repeatedly, possibly worsened by your Ocrevus  infusions. -We prescribed doxycycline  to treat the current infection. -You are referred to a surgeon for evaluation and potential excision of the cyst. -We provided you with gauze and bandages for wound care. -We discussed the nature of pilonidal cysts, their recurrence, and potential complications.  Return if symptoms worsen or fail to improve.   Take care, Dr Kennyth  PLEASE NOTE:  If you had any lab tests, please let us  know if you have not heard back within a few days. You may see your results on mychart before we have a chance to review them but we will give you a call once they are reviewed by us .   If we ordered any referrals today, please let us  know if you have not heard from their office within the next week.   If you had any urgent prescriptions sent in today, please check with the pharmacy within an hour of our visit to make sure the prescription was transmitted appropriately.   Please try these tips to maintain a healthy lifestyle:  Eat at least 3 REAL meals and 1-2 snacks per day.  Aim for no more than 5 hours between eating.  If you eat breakfast, please do so within one hour of getting up.   Each meal should contain half fruits/vegetables, one quarter protein, and one quarter carbs (no bigger than a computer mouse)  Cut down on sweet beverages. This includes juice, soda, and sweet tea.   Drink at least 1 glass of water  with each meal and aim  for at least 8 glasses per day  Exercise at least 150 minutes every week.    Pilonidal Cyst  A pilonidal cyst is a fluid-filled sac that forms under the skin near the tailbone, at the top of the crease of the buttocks (pilonidal area). Cysts that are small and not infected may not cause any problems. Cysts that become irritated or infected may grow and fill with pus. An infected cyst is called an abscess. A pilonidal abscess may cause pain and swelling. It may need to be drained or removed. What are the causes? The cause of this condition is not always known. In some cases, it may be caused by a hair that grows into your skin (ingrown hair). What increases the risk? You are more likely to develop this condition if: You are male. You have lots of hair near the crease of the buttocks. You are overweight. You have a dimple near the crease of the buttocks. You wear tight clothing. You do not bathe or shower often. You sit for long periods of time. What are the signs or symptoms? Symptoms of this condition may include pain, swelling, redness, and warmth in the pilonidal area. You may also be able to feel a lump near your tailbone if your cyst is big. If your cyst becomes infected, symptoms may include: Pus or fluid drainage. Fever. Pain, swelling, and redness getting worse. The lump getting bigger. How is this diagnosed? This condition  may be diagnosed based on: Your symptoms and medical history. A physical exam. A blood test to check for infection. A test of a pus sample. How is this treated? You may not need any treatment if your cyst does not cause symptoms. If your cyst bothers you or is infected, you may need a procedure to drain or remove the cyst. Depending on the size, location, and severity of your cyst, your health care provider may: Make an incision in the cyst and drain it (incision anddrainage). Open and drain the cyst, and then stitch the wound so that it stays open while  it heals (marsupialization). You will be given instructions about how to care for your open wound while it heals. Remove all or part of the cyst, and then close the wound (cyst removal). You may need to take antibiotics before your procedure. Follow these instructions at home: Medicines Take over-the-counter and prescription medicines only as told by your health care provider. If you were prescribed antibiotics, take them as told by your health care provider. Do not stop taking the antibiotic even if you start to feel better. General instructions Keep the area around the cyst clean and dry. If there is fluid or pus draining from your cyst: Cover the area with a clean bandage (dressing). Wash the area gently with soap and water . Pat the area dry with a clean towel. Do not rub the area because that may cause bleeding. Remove hair from the area around the cyst only if your health care provider tells you to. Do not wear tight pants or sit in one position for long periods at a time. Contact a health care provider if: You have new redness, swelling, or pain. You have a fever. You have severe pain. Summary A pilonidal cyst is a fluid-filled sac that forms under the skin near the tailbone, at the top of the crease of the buttocks (pilonidal area). Cysts that become irritated or infected may grow and fill with pus. An infected cyst is called an abscess. The cause of this condition is not always known. In some cases, it may be caused by a hair that grows into your skin (ingrown hair). You may not need any treatment if your cyst does not cause symptoms. If your cyst bothers you or is infected, you may need a procedure to drain or remove the cyst. This information is not intended to replace advice given to you by your health care provider. Make sure you discuss any questions you have with your health care provider. Document Revised: 04/07/2021 Document Reviewed: 04/07/2021 Elsevier Patient Education   2024 [employer].

## 2023-10-04 NOTE — Telephone Encounter (Signed)
 Appt today

## 2023-10-16 ENCOUNTER — Other Ambulatory Visit: Payer: Self-pay | Admitting: Neurology

## 2023-10-17 NOTE — Telephone Encounter (Signed)
 Last filled by patient 09/25/2023 Ordering and Authorizing Provider:  Cherlyn Labella, MD on 05/01/23 Refused due rx not being prescribed or documented in patient's previous ov.    Last seen at our office on 05/30/23 next office visit has not been sheduled  Patient is to return in 6 months (November)

## 2023-12-05 ENCOUNTER — Encounter: Payer: Self-pay | Admitting: Neurology

## 2023-12-05 ENCOUNTER — Telehealth: Payer: Self-pay | Admitting: *Deleted

## 2023-12-05 NOTE — Telephone Encounter (Signed)
 Dr. Vear- pt does not have MS type in last note. can you update this and add new ICD 10 code also? Thank you

## 2023-12-11 ENCOUNTER — Other Ambulatory Visit: Payer: Self-pay | Admitting: Neurology

## 2023-12-12 NOTE — Telephone Encounter (Signed)
 Requested Prescriptions   Pending Prescriptions Disp Refills   cloBAZam  (ONFI ) 10 MG tablet [Pharmacy Med Name: CLOBAZAM  10 MG TABLET] 30 tablet     Sig: TAKE 1 TABLET BY MOUTH EVERYDAY AT BEDTIME   Last seen 05/30/23 Next appt not scheduled Dispenses   Dispensed Days Supply Quantity Provider Pharmacy  CLOBAZAM  10 MG TABLET 11/06/2023 30 30 each Sater, Charlie LABOR, MD [facility] [phone] - G...  CLOBAZAM  10 MG TABLET 09/25/2023 30 30 each Sater, Charlie LABOR, MD [facility] [phone] - G...  CLOBAZAM  10 MG TABLET 08/12/2023 30 30 each Sater, Charlie LABOR, MD [facility] [phone] - G...  CLOBAZAM  10 MG TABLET 07/04/2023 30 30 each Sater, Charlie LABOR, MD [facility] [phone] - G...  CLOBAZAM  10 MG TABLET 05/30/2023 30 30 each Sater, Charlie LABOR, MD [facility] [phone] - G...  CLOBAZAM  10 MG TABLET 05/01/2023 30 30 each Akula, Vijaya, MD [facility] [phone] - [facility]

## 2023-12-25 ENCOUNTER — Telehealth: Payer: Self-pay

## 2023-12-25 DIAGNOSIS — G35A Relapsing-remitting multiple sclerosis: Secondary | ICD-10-CM

## 2023-12-25 DIAGNOSIS — R269 Unspecified abnormalities of gait and mobility: Secondary | ICD-10-CM

## 2023-12-25 DIAGNOSIS — M21372 Foot drop, left foot: Secondary | ICD-10-CM

## 2023-12-25 DIAGNOSIS — R5383 Other fatigue: Secondary | ICD-10-CM

## 2023-12-25 DIAGNOSIS — Z9989 Dependence on other enabling machines and devices: Secondary | ICD-10-CM

## 2023-12-25 DIAGNOSIS — R29818 Other symptoms and signs involving the nervous system: Secondary | ICD-10-CM

## 2023-12-25 NOTE — Telephone Encounter (Signed)
 Copied from CRM #[id number]. Topic: Referral - Request for Referral >> Dec 25, 2023 11:21 AM Frederich PARAS wrote: Did the patient discuss referral with their provider in the last year? Yes   Appointment offered? Yes  Type of order/referral and detailed reason for visit: physical therapy   Preference of office, provider, location: [facility]  physical therapist name slater   If referral order, have you been seen by this specialty before? Yes (If Yes, this issue or another issue? When? Where? They started puttng him with different ones in the past.   Can we respond through MyChart? Yes  Please review and advise if referral is to be placed?

## 2023-12-25 NOTE — Addendum Note (Signed)
 Addended by: Chavis Tessler G on: 12/25/2023 03:30 PM   Modules accepted: Orders

## 2023-12-26 ENCOUNTER — Telehealth: Payer: Self-pay | Admitting: Physical Therapy

## 2023-12-26 NOTE — Telephone Encounter (Signed)
 Hi Dr. Jesus,  I appreciate your PT referral for Mr. Strozier. Despite multiple efforts to accommodate this patient, including rotating providers to ensure the best fit, the patient has demonstrated repeated inappropriate and agitated behavior toward clinic staff. Due to this behavior, we are unable to proceed with treatment in our clinic.  Thank you for your understanding,   Slater Christians, PT, DPT 12/26/23 11:15 AM  [facility] at [facility] [address], [address] [place], [place] [address] Phone # [phone] Fax # [phone]

## 2024-01-12 ENCOUNTER — Ambulatory Visit: Admitting: Internal Medicine

## 2024-01-16 ENCOUNTER — Encounter: Payer: Self-pay | Admitting: Internal Medicine

## 2024-02-01 ENCOUNTER — Encounter: Payer: Self-pay | Admitting: Internal Medicine

## 2024-02-01 ENCOUNTER — Ambulatory Visit (INDEPENDENT_AMBULATORY_CARE_PROVIDER_SITE_OTHER): Admitting: Internal Medicine

## 2024-02-01 VITALS — BP 110/74 | HR 74 | Temp 98.0°F | Ht 68.0 in | Wt 154.6 lb

## 2024-02-01 DIAGNOSIS — G35A Relapsing-remitting multiple sclerosis: Secondary | ICD-10-CM | POA: Diagnosis not present

## 2024-02-01 DIAGNOSIS — R29818 Other symptoms and signs involving the nervous system: Secondary | ICD-10-CM | POA: Diagnosis not present

## 2024-02-01 DIAGNOSIS — R261 Paralytic gait: Secondary | ICD-10-CM

## 2024-02-01 DIAGNOSIS — M21372 Foot drop, left foot: Secondary | ICD-10-CM | POA: Diagnosis not present

## 2024-02-01 DIAGNOSIS — Z9989 Dependence on other enabling machines and devices: Secondary | ICD-10-CM

## 2024-02-01 DIAGNOSIS — R269 Unspecified abnormalities of gait and mobility: Secondary | ICD-10-CM | POA: Diagnosis not present

## 2024-02-01 DIAGNOSIS — G40909 Epilepsy, unspecified, not intractable, without status epilepticus: Secondary | ICD-10-CM

## 2024-02-01 NOTE — Progress Notes (Signed)
 ==============================  [facility]: [phone]   -- Medical Office Visit --  Patient: Miguel Black      Age: 34 y.o.       Sex:  male  Date:   02/01/2024 Today's Healthcare Provider: Bernardino KANDICE Cone, MD  ==============================   Chief Complaint: Discuss Physical Ther (Would like to get referral for Physical There to help with balance and to help proved falls [facility] is where he would like to go )   Discussed the use of AI scribe software for clinical note transcription with the patient, who gave verbal consent to proceed.  History of Present Illness 34 year old male with multiple sclerosis and seizure disorder who presents for a follow-up on his condition and physical therapy referral.  He experiences falls, which he attributes to foot drop and knee issues rather than seizures. His last seizure occurred on April 1st, 2025. He is currently on Depakote , Ocrevus , and clobazam .  His gait is extremely unsteady but he insists on using a cane rather than a walker very poor control of his legs secondary to multiple sclerosis with spasticity  He has not experienced any relapses or flares of multiple sclerosis recently.  He wants physical therapy to address fall risk and improve mobility, specifically mentioning tightness in his left knee and a feeling of 'lock' when sitting. He has been working on strengthening his left arm, which was previously weaker due to his condition.  The recent death of his dog has impacted him emotionally as the dog was a source of safety and companionship, especially during seizures. He is considering getting another dog for both emotional support and seizure safety.   Background Reviewed: Problem List: has Multiple sclerosis, relapsing-remitting; Gait disturbance; Left-sided weakness; Other fatigue; Seizure disorder (HCC); Marijuana use; High risk medication use; History of optic neuritis;  Internuclear ophthalmoplegia of right eye; Neurological deficit present; Hypermetropia of left eye; Foot drop; Vitamin D  deficiency; Smokes cigars; Ambulates with cane; Ataxia; Status epilepticus (HCC); Breakthrough seizure (HCC); Leukocytosis; Hypocalcemia; Elevated AST (SGOT); Seizures (HCC); Seizure (HCC); and Abscess of gluteal cleft on their problem list. Past Medical History:  has a past medical history of Acute encephalopathy (04/26/2022), Acute metabolic encephalopathy (04/26/2022), Acute respiratory failure with hypoxia (HCC) (08/23/2021), Aspiration pneumonia (HCC) (07/12/2018), Closed fracture of distal phalanx or phalanges of hand (02/05/2009), Encounter for screening for HIV (07/12/2018), Foot drop (06/03/2022), Marijuana abuse (05/01/2013), MS (multiple sclerosis), Seizure (HCC), Seizures (HCC) (01/22/2019), and Status epilepticus (HCC) (07/12/2018). Past Surgical History:   has a past surgical history that includes No past surgeries. Social History:   reports that he has been smoking cigars. He has never used smokeless tobacco. He reports that he does not currently use alcohol. He reports current drug use. Drug: Marijuana. Family History:  family history includes Arthritis in his maternal grandmother and mother; Colon cancer (age of onset: 30) in his maternal uncle; Diabetes in his maternal grandmother and paternal grandmother; Healthy in his brother, father, and sister; Hodgkin's lymphoma in his brother; Hypertension in his maternal grandfather, maternal grandmother, and mother; Kidney disease in his maternal grandmother; Stroke in his maternal grandfather. Allergies:  is allergic to ampyra  [dalfampridine ].   Medication Reconciliation: Current Outpatient Medications on File Prior to Visit  Medication Sig   cloBAZam  (ONFI ) 10 MG tablet TAKE 1 TABLET BY MOUTH EVERYDAY AT BEDTIME   diazePAM , 15 MG Dose, (VALTOCO  15 MG DOSE) 2 x 7.5 MG/0.1ML LQPK Place 1 Dose into the  nose daily as needed.  (Patient not taking: Reported on 05/10/2023)   divalproex  (DEPAKOTE ) 250 MG DR tablet Take 1 tablet (250 mg total) by mouth every morning AND 2 tablets (500 mg total) at bedtime.   docusate sodium  (COLACE) 100 MG capsule Take 1 capsule (100 mg total) by mouth 2 (two) times daily as needed for mild constipation. (Patient not taking: Reported on 05/10/2023)   levOCARNitine  (CARNITOR ) 1 GM/10ML solution Take 5 mLs (500 mg total) by mouth 2 (two) times daily.   Multiple Vitamin (MULTIVITAMIN WITH MINERALS) TABS tablet Take 1 tablet by mouth daily.   ocrelizumab  (OCREVUS ) 300 MG/10ML injection Inject 20 mLs (600 mg total) into the vein every 6 (six) months.   No current facility-administered medications on file prior to visit.   Medications Discontinued During This Encounter  Medication Reason   doxycycline  (VIBRA -TABS) 100 MG tablet    doxycycline  (VIBRA -TABS) 100 MG tablet      Physical Exam:    02/01/2024    8:46 AM 10/04/2023    9:47 AM 05/30/2023    9:20 AM  Vitals with BMI  Height 5' 8 5' 8 5' 8  Weight 154 lbs 10 oz 175 lbs 10 oz 169 lbs  BMI 23.51 26.71 25.7  Systolic 110 106 885  Diastolic 74 65 78  Pulse 74 62 69  Vital signs reviewed.  Nursing notes reviewed. Weight trend reviewed. Physical Activity: Not on file   General Appearance:  No acute distress appreciable.   Well-groomed, healthy-appearing male.  Well proportioned with no abnormal fat distribution.  Good muscle tone. Pulmonary:  Normal work of breathing at rest, no respiratory distress apparent. SpO2: 98 %  Musculoskeletal: All extremities are intact.  Neurological:  Awake, alert, oriented, and engaged.  No obvious focal neurological deficits or cognitive impairments.  Sensorium seems unclouded.   Speech is clear and coherent with logical content. Psychiatric:  Appropriate mood, pleasant and cooperative demeanor, thoughtful and engaged during the exam  Severely spastic gait. Walks with wide gait and cane and right leg  crosses in front of left.     10/04/2023    9:51 AM 06/03/2022    3:07 PM 01/15/2020    2:34 PM 01/07/2019    2:32 PM  PHQ 2/9 Scores  PHQ - 2 Score 0 0 0 0  PHQ- 9 Score  2  0       Data saved with a previous flowsheet row definition          ASSESSMENT & PLAN   Assessment & Plan Ambulates with cane Relapsing remitting multiple sclerosis with associated gait disturbance and left foot drop   Multiple sclerosis is well-managed with no recent relapses. Gait disturbance and left foot drop increase fall risk, but no new neurological deficits have appeared since the initial foot drop. Referred to neuro physical therapy with Slater at [facility] for fall risk reduction and mobility improvement. Recommended Tai Chi for fall prevention and postural muscle strengthening. Provided a letter of medical necessity for a service dog to assist with seizure safety and mobility. Left foot drop Spastic gait Gait disturbance Neurological deficit present Very concerned about this patient's gait I tried to talk him into a walker but he declined he uses a wide-based cane and puts a tremendous amount of lateral force on it the manage spastic gait with marked instability.  I am sending him back to neuro rehab urgently but walker or wheelchair would be advisable if he agreed.  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 There is potential for breakthrough seizures if dehydrated or ill. Continue current medications: Divalproex  (Depakote ), Clobazam  (Onfi ), and Ocrelizumab  (Ocrevus ). Provided a letter of medical necessity for a service dog to assist with seizure safety.   ORDER  ASSOCIATIONS  #   DIAGNOSIS / CONDITION ICD-10 ENCOUNTER ORDER     ICD-10-CM   1. Ambulates with cane  Z99.89 Ambulatory referral to Physical Therapy    2. Gait disturbance  R26.9 Ambulatory referral to Physical Therapy    3. Neurological deficit present  R29.818 Ambulatory referral to Physical Therapy    4. Left foot drop  M21.372 Ambulatory referral to Physical Therapy    5. Spastic gait  R26.1     6. Multiple sclerosis, relapsing-remitting  G35.A     7. Seizure disorder (HCC)  G40.909            Orders Placed in Encounter:     Orders Placed This Encounter  Procedures   Ambulatory referral to Physical Therapy    Referral Priority:   Routine    Referral Type:   Physical Medicine    Referral Reason:   Specialty Services Required    Requested Specialty:   Physical Therapy    Number of Visits Requested:   1   ED Discharge Orders          Ordered    Ambulatory referral to Physical Therapy       Comments: [facility] requested. Patient with persistent foot drop/falls request additional therapy to reduce fall risk and improving mobility, also tightness in left knee want to limit contracture.  Strengthening.   02/01/24 0917             On the day of the visit, I dedicated 32 minutes to both direct and indirect patient care activities.  The time was spent: History: I obtained, documented, and reviewed a thorough medical history. I reviewed the patient's reported symptoms and clarified their context and significance in relation to the current visit. Examination: I conducted a medically appropriate physical evaluation. Data Synthesis: I synthesized information for clinical decision-making. Communication: I communicated clinical status and plan to the patient and/or family/caregiver. Counseling & Education: I provided personalized counseling on condition and treatment. Documentation: Documenting clinical findings and medical decision-making, and creating and providing  documentation for patient review. Treatment Plan: I worked collaboratively with the patient to formulate and communicate an individualized plan (including shared decision-making). Orders: I placed necessary orders (medications, labs, imaging, referrals) in the EMR. Referrals and Communication: I referred the patient to other health care professionals as needed and communicated with them to ensure coordinated care.  This time was spent independently of any separately billable procedures. Please note that this statement is intended to provide a clear and comprehensive account of the time and services provided during the patient's visit.  The extended time spent was necessary to provide safe, effective, and comprehensive care due to the following factors:, Extensive Comorbidities: The patient's multiple chronic conditions necessitated careful coordination, monitoring, and integration of care plans., Data Analysis & Complex Decision-Making: I performed in-depth data review and complex treatment planning tailored to the patient's unique clinical profile., and Patient Driven Goal Setting and Wellness  Planning: The patient wanted extended discussion to develop specific health goals and a personalized wellness plan, including diet, exercise, and stress reduction strategies-time that exceeded standard visit and was not separately billable. Multiple documents were created for patient (see AVS and communications) to navigate and assist in obtaining a service dog to help maintain independence and safety and emotional stability given severe seizure disorder(s), ataxia, progressive multiple sclerosis, and recent loss of/death of family dog that had previously saved his life.     This document was synthesized by artificial intelligence (Abridge) using HIPAA-compliant recording of the clinical interaction;   We discussed the use of AI scribe software for clinical note transcription with the patient, who gave verbal consent  to proceed. additional Info: This encounter employed state-of-the-art, real-time, collaborative documentation. The patient actively reviewed and assisted in updating their electronic medical record on a shared screen, ensuring transparency and facilitating joint problem-solving for the problem list, overview, and plan. This approach promotes accurate, informed care. The treatment plan was discussed and reviewed in detail, including medication safety, potential side effects, and all patient questions. We confirmed understanding and comfort with the plan. Follow-up instructions were established, including contacting the office for any concerns, returning if symptoms worsen, persist, or new symptoms develop, and precautions for potential emergency department visits.

## 2024-02-01 NOTE — Assessment & Plan Note (Signed)
 Relapsing remitting multiple sclerosis with associated gait disturbance and left foot drop   Multiple sclerosis is well-managed with no recent relapses. Gait disturbance and left foot drop increase fall risk, but no new neurological deficits have appeared since the initial foot drop. Referred to neuro physical therapy with Miguel Black at [facility] for fall risk reduction and mobility improvement. Recommended Tai Chi for fall prevention and postural muscle strengthening. Provided a letter of medical necessity for a service dog to assist with seizure safety and mobility.

## 2024-02-01 NOTE — Assessment & Plan Note (Signed)
 Very concerned about this patient's gait I tried to talk him into a walker but he declined he uses a wide-based cane and puts a tremendous amount of lateral force on it the manage spastic gait with marked instability.  I am sending him back to neuro rehab urgently but walker or wheelchair would be advisable if he agreed.  I am very concerned about the loss of the dog to help support and him hopeful that a training dog may allow him to retain mobility for longer

## 2024-02-01 NOTE — Patient Instructions (Signed)
 Losing a service dog is a heavy loss, especially with the added challenge of severe seizures and Multiple Sclerosis. This sheet is designed to help your patient navigate the next steps locally in [place] and beyond.  Service Dog Resource Guide: [place] Resident Condition: Seizure Disorder & Multiple Sclerosis 1. Primary Training Organizations ([place] ) These non-profits train dogs specifically for medical alert and response. Eyes, Ears, Nose and Paws (EENP) - High Recommendation Focus: Medical Alert (Seizures) and Mobility. Location: [place], [place] (Within driving distance of [place]). Action: Call (802)134-8485 or email info@eenp .org to request an application packet. Note: They do not use a fax machine. Canine Companions ([place]) Focus: Seizure Response (post-seizure help) and Mobility/Stability. Cost: No charge for the dog or training. Action: Apply online at cci.org/apply or call 1-800-572-BARK. 2. Teacher, English As A Foreign Language Since insurance typically does not cover these costs, use these resources to help with application fees, travel, or dog maintenance. National MS Society ([place] Chapter) The Help: Contact an MS Navigator to ask about Quality of Life grants for assistive technology or service dog support. Contact: Call (682)661-0817 or the local office at 251-373-3534. [employer] The Help: Local civic groups often provide visual merchandiser ($500-$1,000) to help neighbors with service dog costs. Contact: Email lionbcox@outlook .com or [email or web address]@gmail .com to inquire about a warden/ranger. Assistance Dog Scientist, Research (physical Sciences) [facility]) The Help: Provides financial vouchers for individuals to help pay for a dog from an accredited program. Web: assistancedogunitedcampaign.org 3. Medical Documentation Needed To move forward, you will need the following from your doctor (which we have prepared): Letter of Medical Necessity:  Stating that a task-trained dog is required for your safety due to MS and seizures. Physician Information Form: A detailed medical rating (EENP specifically uses a Functional Independence scale). Prescription: A formal Rx for a Seizure Service Dog. 4. Important Legal Reminders Housing: Under the [employer] Act, you are not required to pay pet rent or pet deposits for a service dog, even in no-pet housing. Taxes: Most service dog expenses (food, vet care, training) are tax-deductible as medical expenses. Keep all receipts.

## 2024-02-01 NOTE — Assessment & Plan Note (Signed)
 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 There is potential for breakthrough seizures if dehydrated or ill. Continue current medications: Divalproex  (Depakote ), Clobazam  (Onfi ), and Ocrelizumab  (Ocrevus ). Provided a letter of medical necessity for a service dog to assist with seizure safety.

## 2024-02-14 ENCOUNTER — Ambulatory Visit: Attending: Internal Medicine

## 2024-02-14 DIAGNOSIS — R269 Unspecified abnormalities of gait and mobility: Secondary | ICD-10-CM | POA: Diagnosis present

## 2024-02-14 DIAGNOSIS — R29818 Other symptoms and signs involving the nervous system: Secondary | ICD-10-CM | POA: Insufficient documentation

## 2024-02-14 DIAGNOSIS — M21372 Foot drop, left foot: Secondary | ICD-10-CM | POA: Diagnosis present

## 2024-02-14 DIAGNOSIS — Z9989 Dependence on other enabling machines and devices: Secondary | ICD-10-CM | POA: Diagnosis present

## 2024-02-14 NOTE — Therapy (Signed)
 " OUTPATIENT PHYSICAL THERAPY NEURO EVALUATION   Patient Name: Miguel Black MRN: [id number] DOB:05-Aug-1990, 34 y.o., male Today's Date: 02/14/2024   PCP: Dr. Bernardino Cone REFERRING PROVIDER: Cone Bernardino MATSU, MD  END OF SESSION:  PT End of Session - 02/14/24 1221     Visit Number 1    Number of Visits 11    Date for Recertification  04/24/24    PT Start Time 1105    PT Stop Time 1155    PT Time Calculation (min) 50 min    Equipment Utilized During Treatment Gait belt    Activity Tolerance Patient tolerated treatment well    Behavior During Therapy WFL for tasks assessed/performed          Past Medical History:  Diagnosis Date   Acute encephalopathy 04/26/2022   Acute metabolic encephalopathy 04/26/2022   Acute respiratory failure with hypoxia (HCC) 08/23/2021   Aspiration pneumonia (HCC) 07/12/2018   Closed fracture of distal phalanx or phalanges of hand 02/05/2009   Formatting of this note might be different from the original. Overview: The original diagnosis was replaced automatically by the ICD-10 team to an ICD-10 compliant one. Please review for accuracy   Encounter for screening for HIV 07/12/2018   Foot drop 06/03/2022   Has a left foot drop ever since 2015 first flare of multiple sclerosis had a brace in 2022 custom but does not actually do anything to lift the from the foot physical therapist advised him to get an alternative foot drop brace but I cannot find record of that   Marijuana abuse 05/01/2013   MS (multiple sclerosis)    Seizure (HCC)    Seizures (HCC) 01/22/2019   Status epilepticus (HCC) 07/12/2018   Past Surgical History:  Procedure Laterality Date   NO PAST SURGERIES     Patient Active Problem List   Diagnosis Date Noted   Abscess of gluteal cleft 05/10/2023   Seizure (HCC) 04/25/2023   Seizures (HCC) 11/12/2022   Breakthrough seizure (HCC) 10/07/2022   Leukocytosis 10/07/2022   Hypocalcemia 10/07/2022   Elevated AST (SGOT)  10/07/2022   Status epilepticus (HCC) 06/13/2022   Ambulates with cane 06/04/2022   Ataxia 06/04/2022   Foot drop 06/03/2022   Vitamin D  deficiency 06/03/2022   Smokes cigars 06/03/2022   Neurological deficit present 04/26/2022   History of optic neuritis 07/29/2020   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   High risk medication use 01/28/2020   Seizure disorder (HCC) 12/04/2019   Marijuana use 12/04/2019   Gait disturbance 01/22/2019   Left-sided weakness 01/22/2019   Other fatigue 01/22/2019   Multiple sclerosis, relapsing-remitting 07/12/2018   Hypermetropia of left eye 05/30/2016    ONSET DATE: 02/01/24 date of referral  REFERRING DIAG: Z99.89 (ICD-10-CM) - Ambulates with cane R26.9 (ICD-10-CM) - Gait disturbance R29.818 (ICD-10-CM) - Neurological deficit present M21.372 (ICD-10-CM) - Left foot drop  THERAPY DIAG:  Ambulates with cane - Plan: PT plan of care cert/re-cert  Gait disturbance - Plan: PT plan of care cert/re-cert  Neurological deficit present - Plan: PT plan of care cert/re-cert  Left foot drop - Plan: PT plan of care cert/re-cert  Rationale for Evaluation and Treatment: Rehabilitation  SUBJECTIVE:  SUBJECTIVE STATEMENT: 34 year old male with multiple sclerosis and seizure disorder. His last seizure occurred on April 1st, 2025. Pt was first diagnosed with MS in 2015. Pt is currently living with mom, dad and older brother. Pt is able to take shower independently sitting on shower chair, don/doff clothes independently. Pt needs mod A with cooking. Pt is unable to do cleaning. Pt has 8 steps to enter the house and 16 steps inside the house. Patient has rail for internal and external stairs. Patient has body boss at home which he uses for squats, has free weight that he  does biceps curls and OH press. Dr. Vear is his neurologist. Pt accompanied by: self  PERTINENT HISTORY: MS, hx of seizures, hx of falls  PAIN:  Are you having pain? No  PRECAUTIONS: Fall  RED FLAGS: None   WEIGHT BEARING RESTRICTIONS: No  FALLS: Has patient fallen in last 6 months? Yes. Number of falls 6 since last seizure, patient has 4 nears falls.   LIVING ENVIRONMENT: Lives with: lives with their family Lives in: House/apartment Stairs: Yes: Internal: 16 steps; on left going up and External: 8 steps; bilateral but cannot reach both Has following equipment at home: Single point cane  PLOF: Needs assistance with homemaking  PATIENT GOALS: Improve balance  OBJECTIVE:  Note: Objective measures were completed at Evaluation unless otherwise noted.   COGNITION: Overall cognitive status: Within functional limits for tasks assessed  LOWER EXTREMITY MMT:    MMT Right Eval Left Eval  Hip flexion 5/5 3+/5  Hip extension    Hip abduction    Hip adduction    Hip internal rotation    Hip external rotation    Knee flexion 5/5 3+/5  Knee extension 5/5 5/5  Ankle dorsiflexion 5/5 4/5  Ankle plantarflexion    Ankle inversion    Ankle eversion    (Blank rows = not tested)  BED MOBILITY:  Findings: Sit to supine Complete Independence Supine to sit Complete Independence Rolling to Right Complete Independence Rolling to Left Complete Independence    GAIT: Findings: Gait Characteristics: decreased arm swing- Right, decreased arm swing- Left, decreased step length- Right, decreased step length- Left, decreased stride length, decreased hip/knee flexion- Left, decreased ankle dorsiflexion- Left, ataxic, lateral lean- Right, decreased trunk rotation, abducted- Left, poor foot clearance- Right, and poor foot clearance- Left, Distance walked: 115', Assistive device utilized:Single point cane, Level of assistance: CGA, and Comments:    FUNCTIONAL TESTS:  Timed up and go (TUG):  21 sec with st. Point cane  10 meter walk test: 0.46 m/s with st. Cane (high fall risk <0.65m/s) Berg Balance Scale:  Item Test date: 02/14/23 Date:  Date:   Sitting to standing 3. able to stand independently using hands Insert SmartPhrase OPRCBERGREEVAL Insert SmartPhrase OPRCBERGREEVAL  2. Standing unsupported 4. able to stand safely for 2 minutes    3. Sitting with back unsupported, feet supported 4. able to sit safely and securely for 2 minutes    4. Standing to sitting 1. sits independently but has uncontrolled descent    5. Pivot transfer  3. able to transfer safely with definite need of hands    6. Standing unsupported with eyes closed 4. able to stand 10 seconds safely    7. Standing unsupported with feet together 3. able to place feet together independently and stand 1 minute with supervision    8. Reaching forward with outstretched arms while standing 4. can reach forward confidently 25 cm (10 inches)  9. Pick up object from the floor from standing 4. able to pick up slipper safely and easily    10. Turning to look behind over left and right shoulders while standing 4. looks behind from both sides and weight shifts well    11. Turn 360 degrees 2. able to turn 360 degrees safely but slowly    12. Place alternate foot on step or stool while standing unsupported 0. needs assistance to keep from falling/unable to try    13. Standing unsupported one foot in front 2. able to take small step independently and hold 30 seconds    14. Standing on one leg 0. unable to try of needs assist to prevent fall      Total Score 38/56 Total Score:    Total Score:                                                                                                                                   TREATMENT DATE:   Self care: Reviewed evaluation findings above and discussed fall risk Discussed use of appropriate AD with patient. Potentially cane for in home mobility and rolling walker for outdoor  ambulation We discussed goals of therapy We discussed not seeing use of rolator as I am more disabled. We discussed gait speed goals and how AFO and appropriate AD can help to improve that speed to reduce fall risk. Pt reports he has AFO and patient was asked to bring AFO next session. Gait training: 1 x 230' with st. Point cane, cues for improving L hip/knee flexion during swing phase. Patient tends to invert L ankle during swing phase, should improve with trial of AFO. PATIENT EDUCATION: Education details: see above Person educated: Patient Education method: Explanation Education comprehension: verbalized understanding  HOME EXERCISE PROGRAM: TBD  GOALS: Goals reviewed with patient? Yes  SHORT TERM GOALS: Target date: 03/13/2024    Patient will demo at least 50% compliance with HEP to self manage symptoms.  Baseline:TBD at visit 2 Goal status: INITIAL   LONG TERM GOALS: Target date: 02/28/2024    Patient will demo gait speed of >0.6 m/s with st. Point cane and >0.8 m/s with rolator to improve gait speed and reduce fall risk Baseline: 0.46 m/s with st. Point cane (02/14/24) Goal status: INITIAL  2.  Pt will demo TUG score of <16 sec with st. Point cane to improve mobility and reduce fall risk.  Baseline: 21 sec with st. Point cane (02/14/24) Goal status: INITIAL  3.  Pt will demo BBS core >45/56 to reduce fall risk Baseline: 38/56 (02/14/24) Goal status: INITIAL   ASSESSMENT:  CLINICAL IMPRESSION: Patient is a 34 y.o. male who was seen today for physical therapy evaluation and treatment for gait and balance disorder due to MS. Based on objective findings of TUG score, BBS score and 10 meter gait speed score, patient demonstrates decreased fucntional mobility, balance and high fall risk. Patient  will benefit from skilled PT to address above impairments and improve overall function. Patient also may require L AFO (pending if patient can find his previous AFO and if it is  meeing patient's needs)..    OBJECTIVE IMPAIRMENTS: Abnormal gait, decreased activity tolerance, decreased balance, decreased coordination, decreased endurance, decreased knowledge of use of DME, decreased mobility, difficulty walking, decreased ROM, decreased strength, impaired perceived functional ability, impaired sensation, impaired tone, impaired UE functional use, improper body mechanics, and postural dysfunction.   ACTIVITY LIMITATIONS: carrying, lifting, standing, squatting, stairs, transfers, and reach over head  PARTICIPATION LIMITATIONS: meal prep, cleaning, laundry, driving, shopping, community activity, and yard work  PERSONAL FACTORS: Age, Behavior pattern, Time since onset of injury/illness/exacerbation, Transportation, and 1-2 comorbidities: MS, seizures are also affecting patient's functional outcome.   REHAB POTENTIAL: Good  CLINICAL DECISION MAKING: Evolving/moderate complexity  EVALUATION COMPLEXITY: Moderate  PLAN:  PT FREQUENCY: 1-2x/week  PT DURATION: 10 weeks  PLANNED INTERVENTIONS: 97164- PT Re-evaluation, 97750- Physical Performance Testing, 97110-Therapeutic exercises, 97530- Therapeutic activity, 97112- Neuromuscular re-education, 97535- Self Care, [id number]- Manual therapy, [phone]- Gait training, [phone]- Orthotic Initial, [phone]- Orthotic/Prosthetic subsequent, [phone]- Electrical stimulation (manual), Patient/Family education, Balance training, Stair training, Joint mobilization, DME instructions, Cryotherapy, and Moist heat  PLAN FOR NEXT SESSION: Issue HEP, did pt bring AFO (trial clinic AFO), practice using Rolator, work on controlled sit to stand, BBS components   Raj LOISE Blanch, PT 02/14/2024, 12:22 PM        "

## 2024-02-23 ENCOUNTER — Telehealth: Payer: Self-pay

## 2024-02-23 NOTE — Telephone Encounter (Signed)
 Patient Name: Miguel Black MRN: [id number] DOB:1990/12/09, 34 y.o., male Today's Date: 02/23/2024  Patient was evaluated in physical therapy on 02/14/24. Patient did not schedule any future follow ups Patient was called today and left VM asking patient to return call to schedule for future follow up visits.   Raj LOISE Blanch, PT 02/23/2024, 2:02 PM
